# Patient Record
Sex: Female | Born: 1965 | Race: White | Hispanic: No | Smoking: Never smoker
Health system: Southern US, Community
[De-identification: ages and names within clinical notes are randomized; demographics above are authoritative.]

## PROBLEM LIST (undated history)

## (undated) DIAGNOSIS — K219 Gastro-esophageal reflux disease without esophagitis: Secondary | ICD-10-CM

## (undated) DIAGNOSIS — G43909 Migraine, unspecified, not intractable, without status migrainosus: Secondary | ICD-10-CM

## (undated) DIAGNOSIS — B029 Zoster without complications: Secondary | ICD-10-CM

## (undated) DIAGNOSIS — K3184 Gastroparesis: Secondary | ICD-10-CM

## (undated) DIAGNOSIS — T4145XA Adverse effect of unspecified anesthetic, initial encounter: Secondary | ICD-10-CM

## (undated) DIAGNOSIS — M4316 Spondylolisthesis, lumbar region: Secondary | ICD-10-CM

## (undated) DIAGNOSIS — T8859XA Other complications of anesthesia, initial encounter: Secondary | ICD-10-CM

## (undated) DIAGNOSIS — K449 Diaphragmatic hernia without obstruction or gangrene: Secondary | ICD-10-CM

## (undated) DIAGNOSIS — L0292 Furuncle, unspecified: Secondary | ICD-10-CM

## (undated) DIAGNOSIS — I82409 Acute embolism and thrombosis of unspecified deep veins of unspecified lower extremity: Secondary | ICD-10-CM

## (undated) DIAGNOSIS — K279 Peptic ulcer, site unspecified, unspecified as acute or chronic, without hemorrhage or perforation: Secondary | ICD-10-CM

## (undated) DIAGNOSIS — E782 Mixed hyperlipidemia: Secondary | ICD-10-CM

## (undated) DIAGNOSIS — K589 Irritable bowel syndrome without diarrhea: Secondary | ICD-10-CM

## (undated) DIAGNOSIS — Z22322 Carrier or suspected carrier of Methicillin resistant Staphylococcus aureus: Secondary | ICD-10-CM

## (undated) DIAGNOSIS — B373 Candidiasis of vulva and vagina: Secondary | ICD-10-CM

## (undated) DIAGNOSIS — G2581 Restless legs syndrome: Secondary | ICD-10-CM

## (undated) DIAGNOSIS — C8590 Non-Hodgkin lymphoma, unspecified, unspecified site: Secondary | ICD-10-CM

## (undated) DIAGNOSIS — F419 Anxiety disorder, unspecified: Secondary | ICD-10-CM

## (undated) DIAGNOSIS — K2980 Duodenitis without bleeding: Secondary | ICD-10-CM

## (undated) DIAGNOSIS — B20 Human immunodeficiency virus [HIV] disease: Secondary | ICD-10-CM

## (undated) DIAGNOSIS — S83232A Complex tear of medial meniscus, current injury, left knee, initial encounter: Secondary | ICD-10-CM

## (undated) DIAGNOSIS — S0300XA Dislocation of jaw, unspecified side, initial encounter: Secondary | ICD-10-CM

## (undated) DIAGNOSIS — E785 Hyperlipidemia, unspecified: Secondary | ICD-10-CM

## (undated) DIAGNOSIS — J449 Chronic obstructive pulmonary disease, unspecified: Secondary | ICD-10-CM

## (undated) DIAGNOSIS — S83281A Other tear of lateral meniscus, current injury, right knee, initial encounter: Secondary | ICD-10-CM

## (undated) DIAGNOSIS — K2900 Acute gastritis without bleeding: Secondary | ICD-10-CM

## (undated) DIAGNOSIS — D649 Anemia, unspecified: Secondary | ICD-10-CM

## (undated) DIAGNOSIS — Z21 Asymptomatic human immunodeficiency virus [HIV] infection status: Secondary | ICD-10-CM

## (undated) DIAGNOSIS — M94261 Chondromalacia, right knee: Secondary | ICD-10-CM

## (undated) DIAGNOSIS — M23342 Other meniscus derangements, anterior horn of lateral meniscus, left knee: Secondary | ICD-10-CM

## (undated) HISTORY — DX: Gastroparesis: K31.84

## (undated) HISTORY — DX: Migraine, unspecified, not intractable, without status migrainosus: G43.909

## (undated) HISTORY — DX: Duodenitis without bleeding: K29.80

## (undated) HISTORY — PX: CHOLECYSTECTOMY: SHX55

## (undated) HISTORY — DX: Non-Hodgkin lymphoma, unspecified, unspecified site: C85.90

## (undated) HISTORY — DX: Zoster without complications: B02.9

## (undated) HISTORY — DX: Diaphragmatic hernia without obstruction or gangrene: K44.9

## (undated) HISTORY — DX: Mixed hyperlipidemia: E78.2

## (undated) HISTORY — DX: Irritable bowel syndrome, unspecified: K58.9

## (undated) HISTORY — DX: Acute gastritis without bleeding: K29.00

## (undated) HISTORY — DX: Asymptomatic human immunodeficiency virus (hiv) infection status: Z21

## (undated) HISTORY — DX: Gastro-esophageal reflux disease without esophagitis: K21.9

## (undated) HISTORY — DX: Human immunodeficiency virus (HIV) disease: B20

## (undated) HISTORY — DX: Furuncle, unspecified: L02.92

## (undated) HISTORY — DX: Spondylolisthesis, lumbar region: M43.16

## (undated) HISTORY — DX: Hyperlipidemia, unspecified: E78.5

## (undated) HISTORY — DX: Restless legs syndrome: G25.81

## (undated) HISTORY — DX: Anxiety disorder, unspecified: F41.9

## (undated) HISTORY — PX: UPPER GASTROINTESTINAL ENDOSCOPY: SHX188

## (undated) HISTORY — PX: TYMPANOSTOMY TUBE PLACEMENT: SHX32

## (undated) HISTORY — DX: Anemia, unspecified: D64.9

## (undated) HISTORY — PX: TONSILLECTOMY: SUR1361

## (undated) HISTORY — PX: ENDOMETRIAL ABLATION: SHX621

## (undated) HISTORY — DX: Peptic ulcer, site unspecified, unspecified as acute or chronic, without hemorrhage or perforation: K27.9

## (undated) HISTORY — DX: Candidiasis of vulva and vagina: B37.3

## (undated) HISTORY — PX: TYMPANOPLASTY: SHX33

---
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
## 1997-07-04 DIAGNOSIS — K279 Peptic ulcer, site unspecified, unspecified as acute or chronic, without hemorrhage or perforation: Secondary | ICD-10-CM

## 1997-07-04 HISTORY — DX: Peptic ulcer, site unspecified, unspecified as acute or chronic, without hemorrhage or perforation: K27.9

## 1997-10-09 ENCOUNTER — Encounter: Admission: RE | Admit: 1997-10-09 | Discharge: 1997-10-09 | Payer: Self-pay | Admitting: Internal Medicine

## 1997-11-18 ENCOUNTER — Encounter: Admission: RE | Admit: 1997-11-18 | Discharge: 1997-11-18 | Payer: Self-pay | Admitting: Internal Medicine

## 1998-02-13 ENCOUNTER — Encounter: Admission: RE | Admit: 1998-02-13 | Discharge: 1998-02-13 | Payer: Self-pay | Admitting: Internal Medicine

## 1998-02-24 ENCOUNTER — Encounter: Admission: RE | Admit: 1998-02-24 | Discharge: 1998-02-24 | Payer: Self-pay | Admitting: Internal Medicine

## 1998-02-25 ENCOUNTER — Encounter: Admission: RE | Admit: 1998-02-25 | Discharge: 1998-02-25 | Payer: Self-pay | Admitting: Internal Medicine

## 1998-03-23 ENCOUNTER — Encounter: Admission: RE | Admit: 1998-03-23 | Discharge: 1998-03-23 | Payer: Self-pay | Admitting: Internal Medicine

## 1998-04-13 ENCOUNTER — Encounter: Admission: RE | Admit: 1998-04-13 | Discharge: 1998-04-13 | Payer: Self-pay | Admitting: Internal Medicine

## 1998-04-13 ENCOUNTER — Encounter: Payer: Self-pay | Admitting: Internal Medicine

## 1998-04-13 ENCOUNTER — Ambulatory Visit (HOSPITAL_COMMUNITY): Admission: RE | Admit: 1998-04-13 | Discharge: 1998-04-13 | Payer: Self-pay | Admitting: Internal Medicine

## 1998-04-14 ENCOUNTER — Encounter: Admission: RE | Admit: 1998-04-14 | Discharge: 1998-04-14 | Payer: Self-pay | Admitting: Hematology and Oncology

## 1998-05-08 ENCOUNTER — Ambulatory Visit (HOSPITAL_COMMUNITY): Admission: RE | Admit: 1998-05-08 | Discharge: 1998-05-08 | Payer: Self-pay | Admitting: *Deleted

## 1998-05-08 ENCOUNTER — Encounter: Admission: RE | Admit: 1998-05-08 | Discharge: 1998-05-08 | Payer: Self-pay | Admitting: Internal Medicine

## 1998-08-20 ENCOUNTER — Encounter: Admission: RE | Admit: 1998-08-20 | Discharge: 1998-08-20 | Payer: Self-pay | Admitting: Internal Medicine

## 1998-08-20 ENCOUNTER — Encounter: Payer: Self-pay | Admitting: Hematology and Oncology

## 1998-08-20 ENCOUNTER — Ambulatory Visit (HOSPITAL_COMMUNITY): Admission: RE | Admit: 1998-08-20 | Discharge: 1998-08-20 | Payer: Self-pay | Admitting: Hematology and Oncology

## 1998-09-03 ENCOUNTER — Encounter: Admission: RE | Admit: 1998-09-03 | Discharge: 1998-09-03 | Payer: Self-pay | Admitting: Hematology and Oncology

## 1998-10-07 ENCOUNTER — Encounter: Admission: RE | Admit: 1998-10-07 | Discharge: 1998-10-07 | Payer: Self-pay | Admitting: Hematology and Oncology

## 1998-11-16 ENCOUNTER — Encounter: Admission: RE | Admit: 1998-11-16 | Discharge: 1998-11-16 | Payer: Self-pay | Admitting: Internal Medicine

## 1998-11-16 ENCOUNTER — Ambulatory Visit (HOSPITAL_COMMUNITY): Admission: RE | Admit: 1998-11-16 | Discharge: 1998-11-16 | Payer: Self-pay | Admitting: *Deleted

## 1998-12-15 ENCOUNTER — Encounter: Admission: RE | Admit: 1998-12-15 | Discharge: 1998-12-15 | Payer: Self-pay | Admitting: Hematology and Oncology

## 1998-12-29 ENCOUNTER — Encounter: Admission: RE | Admit: 1998-12-29 | Discharge: 1998-12-29 | Payer: Self-pay | Admitting: Hematology and Oncology

## 1999-02-22 ENCOUNTER — Ambulatory Visit (HOSPITAL_COMMUNITY): Admission: RE | Admit: 1999-02-22 | Discharge: 1999-02-22 | Payer: Self-pay | Admitting: Internal Medicine

## 1999-02-22 ENCOUNTER — Encounter: Admission: RE | Admit: 1999-02-22 | Discharge: 1999-02-22 | Payer: Self-pay | Admitting: Internal Medicine

## 1999-03-11 ENCOUNTER — Encounter: Admission: RE | Admit: 1999-03-11 | Discharge: 1999-03-11 | Payer: Self-pay | Admitting: Internal Medicine

## 1999-04-28 ENCOUNTER — Encounter: Admission: RE | Admit: 1999-04-28 | Discharge: 1999-04-28 | Payer: Self-pay | Admitting: Hematology and Oncology

## 1999-05-25 ENCOUNTER — Ambulatory Visit (HOSPITAL_COMMUNITY): Admission: RE | Admit: 1999-05-25 | Discharge: 1999-05-25 | Payer: Self-pay | Admitting: Internal Medicine

## 1999-05-25 ENCOUNTER — Encounter: Admission: RE | Admit: 1999-05-25 | Discharge: 1999-05-25 | Payer: Self-pay | Admitting: Internal Medicine

## 1999-06-08 ENCOUNTER — Encounter: Admission: RE | Admit: 1999-06-08 | Discharge: 1999-06-08 | Payer: Self-pay | Admitting: Obstetrics & Gynecology

## 1999-06-10 ENCOUNTER — Ambulatory Visit (HOSPITAL_COMMUNITY): Admission: RE | Admit: 1999-06-10 | Discharge: 1999-06-10 | Payer: Self-pay | Admitting: *Deleted

## 1999-08-17 ENCOUNTER — Ambulatory Visit (HOSPITAL_COMMUNITY): Admission: RE | Admit: 1999-08-17 | Discharge: 1999-08-17 | Payer: Self-pay | Admitting: Internal Medicine

## 1999-08-17 ENCOUNTER — Encounter: Admission: RE | Admit: 1999-08-17 | Discharge: 1999-08-17 | Payer: Self-pay | Admitting: Internal Medicine

## 1999-09-07 ENCOUNTER — Encounter: Admission: RE | Admit: 1999-09-07 | Discharge: 1999-09-07 | Payer: Self-pay | Admitting: Obstetrics & Gynecology

## 1999-09-07 ENCOUNTER — Encounter: Admission: RE | Admit: 1999-09-07 | Discharge: 1999-09-07 | Payer: Self-pay | Admitting: Internal Medicine

## 1999-09-07 ENCOUNTER — Other Ambulatory Visit: Admission: RE | Admit: 1999-09-07 | Discharge: 1999-09-07 | Payer: Self-pay | Admitting: Obstetrics & Gynecology

## 1999-09-13 ENCOUNTER — Encounter: Payer: Self-pay | Admitting: Obstetrics & Gynecology

## 1999-09-13 ENCOUNTER — Ambulatory Visit (HOSPITAL_COMMUNITY): Admission: RE | Admit: 1999-09-13 | Discharge: 1999-09-13 | Payer: Self-pay | Admitting: Obstetrics & Gynecology

## 1999-09-21 ENCOUNTER — Encounter: Admission: RE | Admit: 1999-09-21 | Discharge: 1999-09-21 | Payer: Self-pay | Admitting: Internal Medicine

## 1999-09-21 ENCOUNTER — Encounter: Admission: RE | Admit: 1999-09-21 | Discharge: 1999-09-21 | Payer: Self-pay | Admitting: Obstetrics & Gynecology

## 1999-09-21 ENCOUNTER — Ambulatory Visit (HOSPITAL_COMMUNITY): Admission: RE | Admit: 1999-09-21 | Discharge: 1999-09-21 | Payer: Self-pay | Admitting: Hematology and Oncology

## 1999-10-05 ENCOUNTER — Encounter: Admission: RE | Admit: 1999-10-05 | Discharge: 1999-10-05 | Payer: Self-pay | Admitting: Obstetrics & Gynecology

## 1999-10-22 ENCOUNTER — Encounter: Admission: RE | Admit: 1999-10-22 | Discharge: 1999-10-22 | Payer: Self-pay | Admitting: Internal Medicine

## 1999-12-14 ENCOUNTER — Encounter: Admission: RE | Admit: 1999-12-14 | Discharge: 1999-12-14 | Payer: Self-pay | Admitting: Internal Medicine

## 1999-12-14 ENCOUNTER — Ambulatory Visit (HOSPITAL_COMMUNITY): Admission: RE | Admit: 1999-12-14 | Discharge: 1999-12-14 | Payer: Self-pay | Admitting: Internal Medicine

## 1999-12-16 ENCOUNTER — Encounter: Payer: Self-pay | Admitting: Gastroenterology

## 1999-12-16 ENCOUNTER — Ambulatory Visit (HOSPITAL_COMMUNITY): Admission: RE | Admit: 1999-12-16 | Discharge: 1999-12-16 | Payer: Self-pay | Admitting: Gastroenterology

## 2000-02-17 ENCOUNTER — Encounter: Admission: RE | Admit: 2000-02-17 | Discharge: 2000-02-17 | Payer: Self-pay | Admitting: Obstetrics

## 2000-02-17 ENCOUNTER — Ambulatory Visit (HOSPITAL_COMMUNITY): Admission: RE | Admit: 2000-02-17 | Discharge: 2000-02-17 | Payer: Self-pay | Admitting: Obstetrics

## 2000-03-13 ENCOUNTER — Encounter: Admission: RE | Admit: 2000-03-13 | Discharge: 2000-03-13 | Payer: Self-pay | Admitting: Internal Medicine

## 2000-05-03 ENCOUNTER — Ambulatory Visit (HOSPITAL_COMMUNITY): Admission: RE | Admit: 2000-05-03 | Discharge: 2000-05-03 | Payer: Self-pay | Admitting: Gastroenterology

## 2000-05-03 ENCOUNTER — Encounter: Payer: Self-pay | Admitting: Gastroenterology

## 2000-05-11 ENCOUNTER — Encounter: Admission: RE | Admit: 2000-05-11 | Discharge: 2000-05-11 | Payer: Self-pay | Admitting: Hematology and Oncology

## 2000-05-18 ENCOUNTER — Ambulatory Visit (HOSPITAL_COMMUNITY): Admission: RE | Admit: 2000-05-18 | Discharge: 2000-05-18 | Payer: Self-pay | Admitting: Internal Medicine

## 2000-05-18 ENCOUNTER — Encounter: Admission: RE | Admit: 2000-05-18 | Discharge: 2000-05-18 | Payer: Self-pay | Admitting: Obstetrics

## 2000-05-18 ENCOUNTER — Encounter: Admission: RE | Admit: 2000-05-18 | Discharge: 2000-05-18 | Payer: Self-pay | Admitting: Internal Medicine

## 2000-07-17 ENCOUNTER — Encounter: Admission: RE | Admit: 2000-07-17 | Discharge: 2000-07-17 | Payer: Self-pay | Admitting: Internal Medicine

## 2000-08-31 ENCOUNTER — Encounter: Admission: RE | Admit: 2000-08-31 | Discharge: 2000-08-31 | Payer: Self-pay | Admitting: Internal Medicine

## 2000-08-31 ENCOUNTER — Ambulatory Visit (HOSPITAL_COMMUNITY): Admission: RE | Admit: 2000-08-31 | Discharge: 2000-08-31 | Payer: Self-pay | Admitting: Hematology and Oncology

## 2000-09-14 ENCOUNTER — Encounter: Admission: RE | Admit: 2000-09-14 | Discharge: 2000-09-14 | Payer: Self-pay | Admitting: Hematology and Oncology

## 2000-11-09 ENCOUNTER — Ambulatory Visit (HOSPITAL_COMMUNITY): Admission: RE | Admit: 2000-11-09 | Discharge: 2000-11-09 | Payer: Self-pay | Admitting: Hematology and Oncology

## 2000-11-27 ENCOUNTER — Encounter: Admission: RE | Admit: 2000-11-27 | Discharge: 2000-11-27 | Payer: Self-pay | Admitting: Internal Medicine

## 2001-02-01 ENCOUNTER — Ambulatory Visit (HOSPITAL_COMMUNITY): Admission: RE | Admit: 2001-02-01 | Discharge: 2001-02-01 | Payer: Self-pay | Admitting: Internal Medicine

## 2001-02-01 ENCOUNTER — Encounter: Admission: RE | Admit: 2001-02-01 | Discharge: 2001-02-01 | Payer: Self-pay | Admitting: Internal Medicine

## 2001-02-21 ENCOUNTER — Encounter: Admission: RE | Admit: 2001-02-21 | Discharge: 2001-02-21 | Payer: Self-pay | Admitting: Internal Medicine

## 2001-03-27 ENCOUNTER — Encounter: Admission: RE | Admit: 2001-03-27 | Discharge: 2001-03-27 | Payer: Self-pay | Admitting: Obstetrics & Gynecology

## 2001-04-03 ENCOUNTER — Encounter: Admission: RE | Admit: 2001-04-03 | Discharge: 2001-04-03 | Payer: Self-pay | Admitting: Obstetrics & Gynecology

## 2001-05-28 ENCOUNTER — Ambulatory Visit (HOSPITAL_COMMUNITY): Admission: RE | Admit: 2001-05-28 | Discharge: 2001-05-28 | Payer: Self-pay | Admitting: Internal Medicine

## 2001-05-28 ENCOUNTER — Encounter: Admission: RE | Admit: 2001-05-28 | Discharge: 2001-05-28 | Payer: Self-pay | Admitting: Internal Medicine

## 2001-06-06 ENCOUNTER — Encounter: Admission: RE | Admit: 2001-06-06 | Discharge: 2001-06-06 | Payer: Self-pay | Admitting: Internal Medicine

## 2001-06-20 ENCOUNTER — Encounter: Admission: RE | Admit: 2001-06-20 | Discharge: 2001-06-20 | Payer: Self-pay | Admitting: Internal Medicine

## 2001-07-10 ENCOUNTER — Encounter: Admission: RE | Admit: 2001-07-10 | Discharge: 2001-07-10 | Payer: Self-pay | Admitting: Obstetrics & Gynecology

## 2001-09-10 ENCOUNTER — Encounter: Admission: RE | Admit: 2001-09-10 | Discharge: 2001-09-10 | Payer: Self-pay

## 2001-09-10 ENCOUNTER — Ambulatory Visit (HOSPITAL_COMMUNITY): Admission: RE | Admit: 2001-09-10 | Discharge: 2001-09-10 | Payer: Self-pay

## 2001-09-13 ENCOUNTER — Ambulatory Visit (HOSPITAL_COMMUNITY): Admission: RE | Admit: 2001-09-13 | Discharge: 2001-09-13 | Payer: Self-pay | Admitting: *Deleted

## 2001-10-23 ENCOUNTER — Ambulatory Visit (HOSPITAL_COMMUNITY): Admission: RE | Admit: 2001-10-23 | Discharge: 2001-10-23 | Payer: Self-pay | Admitting: Gastroenterology

## 2001-10-23 ENCOUNTER — Encounter: Payer: Self-pay | Admitting: Gastroenterology

## 2001-11-01 ENCOUNTER — Encounter: Admission: RE | Admit: 2001-11-01 | Discharge: 2002-01-30 | Payer: Self-pay | Admitting: *Deleted

## 2001-11-16 ENCOUNTER — Ambulatory Visit (HOSPITAL_COMMUNITY): Admission: RE | Admit: 2001-11-16 | Discharge: 2001-11-16 | Payer: Self-pay | Admitting: Gastroenterology

## 2001-11-16 ENCOUNTER — Encounter: Payer: Self-pay | Admitting: Gastroenterology

## 2001-12-06 ENCOUNTER — Ambulatory Visit (HOSPITAL_COMMUNITY): Admission: RE | Admit: 2001-12-06 | Discharge: 2001-12-06 | Payer: Self-pay | Admitting: Internal Medicine

## 2001-12-06 ENCOUNTER — Encounter: Admission: RE | Admit: 2001-12-06 | Discharge: 2001-12-06 | Payer: Self-pay | Admitting: *Deleted

## 2001-12-06 ENCOUNTER — Encounter: Admission: RE | Admit: 2001-12-06 | Discharge: 2001-12-06 | Payer: Self-pay | Admitting: Internal Medicine

## 2002-01-01 ENCOUNTER — Encounter (INDEPENDENT_AMBULATORY_CARE_PROVIDER_SITE_OTHER): Payer: Self-pay | Admitting: Gastroenterology

## 2002-01-01 DIAGNOSIS — K449 Diaphragmatic hernia without obstruction or gangrene: Secondary | ICD-10-CM

## 2002-01-01 DIAGNOSIS — K2900 Acute gastritis without bleeding: Secondary | ICD-10-CM

## 2002-01-01 DIAGNOSIS — K2980 Duodenitis without bleeding: Secondary | ICD-10-CM

## 2002-01-01 HISTORY — DX: Acute gastritis without bleeding: K29.00

## 2002-01-01 HISTORY — DX: Duodenitis without bleeding: K29.80

## 2002-01-01 HISTORY — DX: Diaphragmatic hernia without obstruction or gangrene: K44.9

## 2002-01-09 ENCOUNTER — Encounter: Admission: RE | Admit: 2002-01-09 | Discharge: 2002-01-09 | Payer: Self-pay | Admitting: Internal Medicine

## 2002-03-28 ENCOUNTER — Encounter: Admission: RE | Admit: 2002-03-28 | Discharge: 2002-06-26 | Payer: Self-pay | Admitting: *Deleted

## 2002-04-12 ENCOUNTER — Ambulatory Visit (HOSPITAL_COMMUNITY): Admission: RE | Admit: 2002-04-12 | Discharge: 2002-04-12 | Payer: Self-pay | Admitting: Internal Medicine

## 2002-04-12 ENCOUNTER — Encounter: Admission: RE | Admit: 2002-04-12 | Discharge: 2002-04-12 | Payer: Self-pay | Admitting: Internal Medicine

## 2002-04-23 ENCOUNTER — Encounter: Admission: RE | Admit: 2002-04-23 | Discharge: 2002-04-23 | Payer: Self-pay | Admitting: Internal Medicine

## 2002-08-06 ENCOUNTER — Ambulatory Visit (HOSPITAL_COMMUNITY): Admission: RE | Admit: 2002-08-06 | Discharge: 2002-08-06 | Payer: Self-pay | Admitting: Internal Medicine

## 2002-08-06 ENCOUNTER — Encounter: Admission: RE | Admit: 2002-08-06 | Discharge: 2002-08-06 | Payer: Self-pay | Admitting: Internal Medicine

## 2002-08-13 ENCOUNTER — Encounter: Payer: Self-pay | Admitting: Internal Medicine

## 2002-08-13 ENCOUNTER — Ambulatory Visit (HOSPITAL_COMMUNITY): Admission: RE | Admit: 2002-08-13 | Discharge: 2002-08-13 | Payer: Self-pay | Admitting: Internal Medicine

## 2002-08-16 ENCOUNTER — Encounter: Admission: RE | Admit: 2002-08-16 | Discharge: 2002-08-16 | Payer: Self-pay | Admitting: Internal Medicine

## 2002-08-21 ENCOUNTER — Encounter: Admission: RE | Admit: 2002-08-21 | Discharge: 2002-08-21 | Payer: Self-pay | Admitting: Internal Medicine

## 2002-09-05 ENCOUNTER — Encounter: Admission: RE | Admit: 2002-09-05 | Discharge: 2002-09-05 | Payer: Self-pay | Admitting: Internal Medicine

## 2002-11-04 ENCOUNTER — Encounter: Admission: RE | Admit: 2002-11-04 | Discharge: 2002-11-04 | Payer: Self-pay | Admitting: Internal Medicine

## 2002-11-29 ENCOUNTER — Encounter: Admission: RE | Admit: 2002-11-29 | Discharge: 2002-11-29 | Payer: Self-pay | Admitting: Internal Medicine

## 2003-01-10 ENCOUNTER — Encounter: Admission: RE | Admit: 2003-01-10 | Discharge: 2003-01-10 | Payer: Self-pay | Admitting: Internal Medicine

## 2003-02-14 ENCOUNTER — Encounter: Admission: RE | Admit: 2003-02-14 | Discharge: 2003-02-14 | Payer: Self-pay | Admitting: Internal Medicine

## 2003-02-19 ENCOUNTER — Encounter: Admission: RE | Admit: 2003-02-19 | Discharge: 2003-02-19 | Payer: Self-pay | Admitting: Internal Medicine

## 2003-02-19 ENCOUNTER — Ambulatory Visit (HOSPITAL_COMMUNITY): Admission: RE | Admit: 2003-02-19 | Discharge: 2003-02-19 | Payer: Self-pay | Admitting: Internal Medicine

## 2003-02-19 ENCOUNTER — Encounter (INDEPENDENT_AMBULATORY_CARE_PROVIDER_SITE_OTHER): Payer: Self-pay | Admitting: Internal Medicine

## 2003-03-13 ENCOUNTER — Encounter: Admission: RE | Admit: 2003-03-13 | Discharge: 2003-03-13 | Payer: Self-pay | Admitting: Internal Medicine

## 2003-03-18 ENCOUNTER — Ambulatory Visit (HOSPITAL_COMMUNITY): Admission: RE | Admit: 2003-03-18 | Discharge: 2003-03-18 | Payer: Self-pay | Admitting: Internal Medicine

## 2003-03-18 ENCOUNTER — Encounter: Payer: Self-pay | Admitting: Internal Medicine

## 2003-03-24 ENCOUNTER — Encounter: Payer: Self-pay | Admitting: Gastroenterology

## 2003-03-24 ENCOUNTER — Ambulatory Visit (HOSPITAL_COMMUNITY): Admission: RE | Admit: 2003-03-24 | Discharge: 2003-03-24 | Payer: Self-pay | Admitting: Gastroenterology

## 2003-04-16 ENCOUNTER — Encounter: Admission: RE | Admit: 2003-04-16 | Discharge: 2003-04-16 | Payer: Self-pay | Admitting: Internal Medicine

## 2003-06-20 ENCOUNTER — Encounter: Admission: RE | Admit: 2003-06-20 | Discharge: 2003-06-20 | Payer: Self-pay | Admitting: Internal Medicine

## 2003-06-20 ENCOUNTER — Ambulatory Visit (HOSPITAL_COMMUNITY): Admission: RE | Admit: 2003-06-20 | Discharge: 2003-06-20 | Payer: Self-pay | Admitting: Internal Medicine

## 2003-06-20 ENCOUNTER — Encounter (INDEPENDENT_AMBULATORY_CARE_PROVIDER_SITE_OTHER): Payer: Self-pay | Admitting: Internal Medicine

## 2003-06-26 ENCOUNTER — Encounter (INDEPENDENT_AMBULATORY_CARE_PROVIDER_SITE_OTHER): Payer: Self-pay | Admitting: *Deleted

## 2003-06-26 ENCOUNTER — Encounter (INDEPENDENT_AMBULATORY_CARE_PROVIDER_SITE_OTHER): Payer: Self-pay | Admitting: Radiology

## 2003-06-26 ENCOUNTER — Other Ambulatory Visit: Admission: RE | Admit: 2003-06-26 | Discharge: 2003-06-26 | Payer: Self-pay | Admitting: Radiology

## 2003-06-26 ENCOUNTER — Encounter: Admission: RE | Admit: 2003-06-26 | Discharge: 2003-06-26 | Payer: Self-pay | Admitting: Obstetrics & Gynecology

## 2003-07-02 ENCOUNTER — Encounter: Admission: RE | Admit: 2003-07-02 | Discharge: 2003-07-02 | Payer: Self-pay | Admitting: Obstetrics & Gynecology

## 2003-07-18 ENCOUNTER — Encounter (HOSPITAL_BASED_OUTPATIENT_CLINIC_OR_DEPARTMENT_OTHER): Payer: Self-pay | Admitting: General Surgery

## 2003-07-18 ENCOUNTER — Ambulatory Visit (HOSPITAL_COMMUNITY): Admission: RE | Admit: 2003-07-18 | Discharge: 2003-07-19 | Payer: Self-pay | Admitting: General Surgery

## 2003-07-18 ENCOUNTER — Encounter (INDEPENDENT_AMBULATORY_CARE_PROVIDER_SITE_OTHER): Payer: Self-pay | Admitting: *Deleted

## 2003-07-31 ENCOUNTER — Ambulatory Visit (HOSPITAL_COMMUNITY): Admission: RE | Admit: 2003-07-31 | Discharge: 2003-07-31 | Payer: Self-pay | Admitting: Oncology

## 2003-07-31 ENCOUNTER — Encounter (INDEPENDENT_AMBULATORY_CARE_PROVIDER_SITE_OTHER): Payer: Self-pay | Admitting: Specialist

## 2003-08-01 ENCOUNTER — Ambulatory Visit (HOSPITAL_COMMUNITY): Admission: RE | Admit: 2003-08-01 | Discharge: 2003-08-01 | Payer: Self-pay | Admitting: Oncology

## 2003-08-01 ENCOUNTER — Encounter (INDEPENDENT_AMBULATORY_CARE_PROVIDER_SITE_OTHER): Payer: Self-pay | Admitting: Specialist

## 2003-08-04 ENCOUNTER — Ambulatory Visit (HOSPITAL_COMMUNITY): Admission: RE | Admit: 2003-08-04 | Discharge: 2003-08-04 | Payer: Self-pay | Admitting: General Surgery

## 2003-08-04 ENCOUNTER — Ambulatory Visit (HOSPITAL_COMMUNITY): Admission: RE | Admit: 2003-08-04 | Discharge: 2003-08-04 | Payer: Self-pay | Admitting: Oncology

## 2003-08-04 ENCOUNTER — Ambulatory Visit (HOSPITAL_BASED_OUTPATIENT_CLINIC_OR_DEPARTMENT_OTHER): Admission: RE | Admit: 2003-08-04 | Discharge: 2003-08-04 | Payer: Self-pay | Admitting: General Surgery

## 2003-08-11 ENCOUNTER — Encounter: Admission: RE | Admit: 2003-08-11 | Discharge: 2003-08-11 | Payer: Self-pay | Admitting: Internal Medicine

## 2003-08-13 ENCOUNTER — Inpatient Hospital Stay (HOSPITAL_COMMUNITY): Admission: AD | Admit: 2003-08-13 | Discharge: 2003-08-18 | Payer: Self-pay | Admitting: Oncology

## 2003-09-11 ENCOUNTER — Ambulatory Visit (HOSPITAL_COMMUNITY): Admission: RE | Admit: 2003-09-11 | Discharge: 2003-09-11 | Payer: Self-pay | Admitting: Internal Medicine

## 2003-09-11 ENCOUNTER — Encounter: Admission: RE | Admit: 2003-09-11 | Discharge: 2003-09-11 | Payer: Self-pay | Admitting: Internal Medicine

## 2003-09-16 ENCOUNTER — Ambulatory Visit: Admission: RE | Admit: 2003-09-16 | Discharge: 2003-12-05 | Payer: Self-pay | Admitting: Radiation Oncology

## 2003-10-15 ENCOUNTER — Inpatient Hospital Stay (HOSPITAL_COMMUNITY): Admission: EM | Admit: 2003-10-15 | Discharge: 2003-10-18 | Payer: Self-pay | Admitting: Oncology

## 2003-11-03 ENCOUNTER — Encounter: Admission: RE | Admit: 2003-11-03 | Discharge: 2003-11-03 | Payer: Self-pay | Admitting: Internal Medicine

## 2003-11-28 ENCOUNTER — Ambulatory Visit (HOSPITAL_BASED_OUTPATIENT_CLINIC_OR_DEPARTMENT_OTHER): Admission: RE | Admit: 2003-11-28 | Discharge: 2003-11-28 | Payer: Self-pay | Admitting: General Surgery

## 2003-12-11 ENCOUNTER — Ambulatory Visit (HOSPITAL_COMMUNITY): Admission: RE | Admit: 2003-12-11 | Discharge: 2003-12-11 | Payer: Self-pay | Admitting: Internal Medicine

## 2003-12-11 ENCOUNTER — Encounter: Admission: RE | Admit: 2003-12-11 | Discharge: 2003-12-11 | Payer: Self-pay | Admitting: Internal Medicine

## 2003-12-23 ENCOUNTER — Ambulatory Visit: Admission: RE | Admit: 2003-12-23 | Discharge: 2003-12-23 | Payer: Self-pay | Admitting: Radiation Oncology

## 2004-01-20 ENCOUNTER — Ambulatory Visit: Admission: RE | Admit: 2004-01-20 | Discharge: 2004-01-20 | Payer: Self-pay | Admitting: Radiation Oncology

## 2004-01-20 ENCOUNTER — Ambulatory Visit (HOSPITAL_COMMUNITY): Admission: RE | Admit: 2004-01-20 | Discharge: 2004-01-20 | Payer: Self-pay | Admitting: Radiation Oncology

## 2004-02-11 ENCOUNTER — Ambulatory Visit (HOSPITAL_COMMUNITY): Admission: RE | Admit: 2004-02-11 | Discharge: 2004-02-11 | Payer: Self-pay | Admitting: Internal Medicine

## 2004-02-11 ENCOUNTER — Encounter: Admission: RE | Admit: 2004-02-11 | Discharge: 2004-02-11 | Payer: Self-pay | Admitting: Internal Medicine

## 2004-02-12 ENCOUNTER — Ambulatory Visit (HOSPITAL_COMMUNITY): Admission: RE | Admit: 2004-02-12 | Discharge: 2004-02-12 | Payer: Self-pay | Admitting: Oncology

## 2004-02-18 ENCOUNTER — Encounter: Admission: RE | Admit: 2004-02-18 | Discharge: 2004-02-18 | Payer: Self-pay | Admitting: Internal Medicine

## 2004-02-23 ENCOUNTER — Ambulatory Visit (HOSPITAL_COMMUNITY): Admission: RE | Admit: 2004-02-23 | Discharge: 2004-02-23 | Payer: Self-pay | Admitting: Oncology

## 2004-03-02 ENCOUNTER — Encounter: Admission: RE | Admit: 2004-03-02 | Discharge: 2004-03-02 | Payer: Self-pay | Admitting: Internal Medicine

## 2004-04-26 ENCOUNTER — Encounter: Admission: RE | Admit: 2004-04-26 | Discharge: 2004-05-19 | Payer: Self-pay | Admitting: General Surgery

## 2004-04-27 ENCOUNTER — Ambulatory Visit: Admission: RE | Admit: 2004-04-27 | Discharge: 2004-04-27 | Payer: Self-pay | Admitting: Radiation Oncology

## 2004-05-04 ENCOUNTER — Ambulatory Visit: Admission: RE | Admit: 2004-05-04 | Discharge: 2004-05-04 | Payer: Self-pay | Admitting: Radiation Oncology

## 2004-05-12 ENCOUNTER — Ambulatory Visit: Payer: Self-pay | Admitting: Internal Medicine

## 2004-05-12 ENCOUNTER — Ambulatory Visit (HOSPITAL_COMMUNITY): Admission: RE | Admit: 2004-05-12 | Discharge: 2004-05-12 | Payer: Self-pay | Admitting: Internal Medicine

## 2004-06-25 ENCOUNTER — Ambulatory Visit (HOSPITAL_COMMUNITY): Admission: RE | Admit: 2004-06-25 | Discharge: 2004-06-25 | Payer: Self-pay | Admitting: Oncology

## 2004-06-25 ENCOUNTER — Ambulatory Visit: Payer: Self-pay | Admitting: Oncology

## 2004-06-29 ENCOUNTER — Encounter: Admission: RE | Admit: 2004-06-29 | Discharge: 2004-06-29 | Payer: Self-pay | Admitting: Internal Medicine

## 2004-09-15 ENCOUNTER — Ambulatory Visit (HOSPITAL_COMMUNITY): Admission: RE | Admit: 2004-09-15 | Discharge: 2004-09-15 | Payer: Self-pay | Admitting: Internal Medicine

## 2004-09-15 ENCOUNTER — Ambulatory Visit: Payer: Self-pay | Admitting: Internal Medicine

## 2004-09-29 ENCOUNTER — Ambulatory Visit: Payer: Self-pay | Admitting: Gastroenterology

## 2004-09-30 ENCOUNTER — Encounter: Admission: RE | Admit: 2004-09-30 | Discharge: 2004-09-30 | Payer: Self-pay | Admitting: *Deleted

## 2004-10-20 ENCOUNTER — Ambulatory Visit: Payer: Self-pay | Admitting: Oncology

## 2004-10-21 ENCOUNTER — Ambulatory Visit (HOSPITAL_COMMUNITY): Admission: RE | Admit: 2004-10-21 | Discharge: 2004-10-21 | Payer: Self-pay | Admitting: Oncology

## 2005-01-19 ENCOUNTER — Ambulatory Visit (HOSPITAL_COMMUNITY): Admission: RE | Admit: 2005-01-19 | Discharge: 2005-01-19 | Payer: Self-pay | Admitting: Internal Medicine

## 2005-01-19 ENCOUNTER — Ambulatory Visit: Payer: Self-pay | Admitting: Internal Medicine

## 2005-01-20 ENCOUNTER — Ambulatory Visit: Payer: Self-pay

## 2005-02-11 ENCOUNTER — Ambulatory Visit: Payer: Self-pay | Admitting: Oncology

## 2005-02-14 ENCOUNTER — Ambulatory Visit (HOSPITAL_COMMUNITY): Admission: RE | Admit: 2005-02-14 | Discharge: 2005-02-14 | Payer: Self-pay | Admitting: Oncology

## 2005-02-23 ENCOUNTER — Ambulatory Visit: Payer: Self-pay | Admitting: Internal Medicine

## 2005-04-15 ENCOUNTER — Ambulatory Visit: Payer: Self-pay | Admitting: Internal Medicine

## 2005-06-08 ENCOUNTER — Ambulatory Visit (HOSPITAL_COMMUNITY): Admission: RE | Admit: 2005-06-08 | Discharge: 2005-06-08 | Payer: Self-pay | Admitting: Infectious Diseases

## 2005-06-08 ENCOUNTER — Ambulatory Visit: Payer: Self-pay | Admitting: Internal Medicine

## 2005-06-10 ENCOUNTER — Ambulatory Visit: Payer: Self-pay | Admitting: Oncology

## 2005-06-13 ENCOUNTER — Ambulatory Visit (HOSPITAL_COMMUNITY): Admission: RE | Admit: 2005-06-13 | Discharge: 2005-06-13 | Payer: Self-pay | Admitting: Oncology

## 2005-06-30 ENCOUNTER — Encounter: Admission: RE | Admit: 2005-06-30 | Discharge: 2005-06-30 | Payer: Self-pay | Admitting: Internal Medicine

## 2005-06-30 ENCOUNTER — Ambulatory Visit (HOSPITAL_COMMUNITY): Admission: RE | Admit: 2005-06-30 | Discharge: 2005-06-30 | Payer: Self-pay | Admitting: Obstetrics & Gynecology

## 2005-07-29 ENCOUNTER — Ambulatory Visit: Payer: Self-pay | Admitting: Internal Medicine

## 2005-09-28 ENCOUNTER — Ambulatory Visit: Payer: Self-pay | Admitting: Internal Medicine

## 2005-09-28 ENCOUNTER — Encounter: Admission: RE | Admit: 2005-09-28 | Discharge: 2005-09-28 | Payer: Self-pay | Admitting: Internal Medicine

## 2005-11-02 ENCOUNTER — Ambulatory Visit: Payer: Self-pay | Admitting: Internal Medicine

## 2005-11-14 ENCOUNTER — Ambulatory Visit: Payer: Self-pay | Admitting: Gastroenterology

## 2005-12-02 ENCOUNTER — Encounter (INDEPENDENT_AMBULATORY_CARE_PROVIDER_SITE_OTHER): Payer: Self-pay | Admitting: *Deleted

## 2005-12-02 ENCOUNTER — Ambulatory Visit: Payer: Self-pay | Admitting: Gastroenterology

## 2005-12-05 ENCOUNTER — Ambulatory Visit: Payer: Self-pay | Admitting: Gastroenterology

## 2005-12-08 ENCOUNTER — Ambulatory Visit: Payer: Self-pay | Admitting: Oncology

## 2005-12-12 ENCOUNTER — Ambulatory Visit (HOSPITAL_COMMUNITY): Admission: RE | Admit: 2005-12-12 | Discharge: 2005-12-12 | Payer: Self-pay | Admitting: Oncology

## 2005-12-12 LAB — COMPREHENSIVE METABOLIC PANEL
AST: 12 U/L (ref 0–37)
Albumin: 4.4 g/dL (ref 3.5–5.2)
Alkaline Phosphatase: 66 U/L (ref 39–117)
BUN: 11 mg/dL (ref 6–23)
Glucose, Bld: 127 mg/dL — ABNORMAL HIGH (ref 70–99)
Potassium: 3.9 mEq/L (ref 3.5–5.3)
Sodium: 138 mEq/L (ref 135–145)
Total Bilirubin: 0.3 mg/dL (ref 0.3–1.2)

## 2005-12-12 LAB — CBC WITH DIFFERENTIAL/PLATELET
BASO%: 0.4 % (ref 0.0–2.0)
EOS%: 1.5 % (ref 0.0–7.0)
HCT: 39.8 % (ref 34.8–46.6)
LYMPH%: 28.4 % (ref 14.0–48.0)
MCH: 33.7 pg (ref 26.0–34.0)
MCHC: 34 g/dL (ref 32.0–36.0)
NEUT%: 64.9 % (ref 39.6–76.8)
Platelets: 289 10*3/uL (ref 145–400)
RBC: 4.02 10*6/uL (ref 3.70–5.32)

## 2005-12-28 ENCOUNTER — Ambulatory Visit (HOSPITAL_COMMUNITY): Admission: RE | Admit: 2005-12-28 | Discharge: 2005-12-28 | Payer: Self-pay | Admitting: Gastroenterology

## 2006-01-09 ENCOUNTER — Ambulatory Visit: Payer: Self-pay | Admitting: Gastroenterology

## 2006-01-19 ENCOUNTER — Ambulatory Visit (HOSPITAL_COMMUNITY): Admission: RE | Admit: 2006-01-19 | Discharge: 2006-01-19 | Payer: Self-pay | Admitting: Internal Medicine

## 2006-01-19 ENCOUNTER — Ambulatory Visit: Payer: Self-pay | Admitting: Internal Medicine

## 2006-05-02 ENCOUNTER — Encounter: Admission: RE | Admit: 2006-05-02 | Discharge: 2006-05-02 | Payer: Self-pay | Admitting: Internal Medicine

## 2006-05-02 ENCOUNTER — Encounter (INDEPENDENT_AMBULATORY_CARE_PROVIDER_SITE_OTHER): Payer: Self-pay | Admitting: Infectious Diseases

## 2006-05-02 ENCOUNTER — Ambulatory Visit: Payer: Self-pay | Admitting: Internal Medicine

## 2006-05-02 LAB — CONVERTED CEMR LAB
ALT: 12 units/L (ref 0–40)
Albumin: 4 g/dL (ref 3.5–5.2)
CO2: 24 meq/L (ref 19–32)
Calcium: 8.4 mg/dL (ref 8.4–10.5)
Chloride: 104 meq/L (ref 96–112)
Cholesterol: 180 mg/dL (ref 0–200)
Glucose, Bld: 209 mg/dL — ABNORMAL HIGH (ref 70–99)
HIV-1 RNA Quant, Log: <1.7 (ref <1.70)
Leukocyte count, blood: 4.9 10*9/L (ref 4.0–10.5)
MCV: 95.8 fL (ref 78.0–100.0)
Potassium: 4.1 meq/L (ref 3.5–5.3)
RBC: 4.28 M/uL (ref 3.87–5.11)
Sodium: 138 meq/L (ref 135–145)
Total Protein: 6.3 g/dL (ref 6.0–8.3)
Triglycerides: 403 mg/dL — ABNORMAL HIGH (ref <150)

## 2006-05-16 ENCOUNTER — Ambulatory Visit: Payer: Self-pay | Admitting: Hospitalist

## 2006-05-16 ENCOUNTER — Encounter (INDEPENDENT_AMBULATORY_CARE_PROVIDER_SITE_OTHER): Payer: Self-pay | Admitting: Internal Medicine

## 2006-05-16 LAB — CONVERTED CEMR LAB
CO2: 27 meq/L (ref 19–32)
Glucose, Bld: 146 mg/dL — ABNORMAL HIGH (ref 70–99)
Potassium: 3.9 meq/L (ref 3.5–5.3)
Sodium: 139 meq/L (ref 135–145)

## 2006-05-17 ENCOUNTER — Ambulatory Visit: Payer: Self-pay | Admitting: Gastroenterology

## 2006-06-09 ENCOUNTER — Ambulatory Visit: Payer: Self-pay | Admitting: Oncology

## 2006-06-13 ENCOUNTER — Ambulatory Visit (HOSPITAL_COMMUNITY): Admission: RE | Admit: 2006-06-13 | Discharge: 2006-06-13 | Payer: Self-pay | Admitting: Oncology

## 2006-06-13 LAB — COMPREHENSIVE METABOLIC PANEL
ALT: 16 U/L (ref 0–35)
Albumin: 4.2 g/dL (ref 3.5–5.2)
CO2: 25 mEq/L (ref 19–32)
Potassium: 4.2 mEq/L (ref 3.5–5.3)
Sodium: 140 mEq/L (ref 135–145)
Total Bilirubin: 0.4 mg/dL (ref 0.3–1.2)
Total Protein: 6.9 g/dL (ref 6.0–8.3)

## 2006-06-13 LAB — CBC WITH DIFFERENTIAL/PLATELET
Basophils Absolute: 0.1 10*3/uL (ref 0.0–0.1)
EOS%: 1.5 % (ref 0.0–7.0)
HCT: 39 % (ref 34.8–46.6)
HGB: 13.3 g/dL (ref 11.6–15.9)
LYMPH%: 34.4 % (ref 14.0–48.0)
MCH: 30.6 pg (ref 26.0–34.0)
MCV: 90 fL (ref 81.0–101.0)
MONO%: 5 % (ref 0.0–13.0)
NEUT%: 58.3 % (ref 39.6–76.8)
Platelets: 219 10*3/uL (ref 145–400)

## 2006-06-13 LAB — LACTATE DEHYDROGENASE: LDH: 125 U/L (ref 94–250)

## 2006-06-13 LAB — MORPHOLOGY

## 2006-07-03 ENCOUNTER — Encounter: Admission: RE | Admit: 2006-07-03 | Discharge: 2006-07-03 | Payer: Self-pay | Admitting: Pulmonary Disease

## 2006-07-14 DIAGNOSIS — G43719 Chronic migraine without aura, intractable, without status migrainosus: Secondary | ICD-10-CM

## 2006-07-14 DIAGNOSIS — K209 Esophagitis, unspecified without bleeding: Secondary | ICD-10-CM | POA: Insufficient documentation

## 2006-07-14 DIAGNOSIS — G43711 Chronic migraine without aura, intractable, with status migrainosus: Secondary | ICD-10-CM | POA: Insufficient documentation

## 2006-07-14 DIAGNOSIS — B20 Human immunodeficiency virus [HIV] disease: Secondary | ICD-10-CM | POA: Insufficient documentation

## 2006-07-19 DIAGNOSIS — F32A Depression, unspecified: Secondary | ICD-10-CM | POA: Insufficient documentation

## 2006-07-19 DIAGNOSIS — E785 Hyperlipidemia, unspecified: Secondary | ICD-10-CM

## 2006-07-19 DIAGNOSIS — G609 Hereditary and idiopathic neuropathy, unspecified: Secondary | ICD-10-CM | POA: Insufficient documentation

## 2006-07-19 DIAGNOSIS — F411 Generalized anxiety disorder: Secondary | ICD-10-CM | POA: Insufficient documentation

## 2006-07-19 DIAGNOSIS — Z87898 Personal history of other specified conditions: Secondary | ICD-10-CM | POA: Insufficient documentation

## 2006-07-19 DIAGNOSIS — F329 Major depressive disorder, single episode, unspecified: Secondary | ICD-10-CM

## 2006-07-19 DIAGNOSIS — K219 Gastro-esophageal reflux disease without esophagitis: Secondary | ICD-10-CM | POA: Insufficient documentation

## 2006-08-31 ENCOUNTER — Encounter: Admission: RE | Admit: 2006-08-31 | Discharge: 2006-08-31 | Payer: Self-pay | Admitting: Internal Medicine

## 2006-08-31 ENCOUNTER — Encounter (INDEPENDENT_AMBULATORY_CARE_PROVIDER_SITE_OTHER): Payer: Self-pay | Admitting: Pulmonary Disease

## 2006-08-31 ENCOUNTER — Ambulatory Visit: Payer: Self-pay | Admitting: Internal Medicine

## 2006-08-31 DIAGNOSIS — I82409 Acute embolism and thrombosis of unspecified deep veins of unspecified lower extremity: Secondary | ICD-10-CM | POA: Insufficient documentation

## 2006-08-31 LAB — CONVERTED CEMR LAB
Basophils Relative: 0 % (ref 0–1)
Cholesterol: 164 mg/dL (ref 0–200)
Creatinine, Urine: 250.2 mg/dL
Eosinophils Absolute: 0.1 10*3/uL (ref 0.0–0.7)
HDL: 34 mg/dL — ABNORMAL LOW (ref >39)
Hgb A1c MFr Bld: 5.5 %
LDL Cholesterol: 58 mg/dL (ref 0–99)
Lymphs Abs: 2 10*3/uL (ref 0.7–3.3)
MCHC: 31.2 g/dL (ref 30.0–36.0)
MCV: 93.7 fL (ref 78.0–100.0)
Microalb, Ur: 0.75 mg/dL (ref 0.00–1.89)
Monocytes Relative: 6 % (ref 3–11)
Neutro Abs: 5.1 10*3/uL (ref 1.7–7.7)
Neutrophils Relative %: 67 % (ref 43–77)
Platelets: 246 10*3/uL (ref 150–400)
RBC: 4.73 M/uL (ref 3.87–5.11)
Triglycerides: 358 mg/dL — ABNORMAL HIGH (ref <150)
WBC: 7.6 10*3/uL (ref 4.0–10.5)

## 2006-09-01 ENCOUNTER — Ambulatory Visit (HOSPITAL_COMMUNITY): Admission: RE | Admit: 2006-09-01 | Discharge: 2006-09-01 | Payer: Self-pay | Admitting: Hospitalist

## 2006-09-01 ENCOUNTER — Ambulatory Visit: Payer: Self-pay | Admitting: Internal Medicine

## 2006-09-01 ENCOUNTER — Ambulatory Visit: Payer: Self-pay | Admitting: Vascular Surgery

## 2006-09-01 ENCOUNTER — Ambulatory Visit (HOSPITAL_COMMUNITY): Admission: RE | Admit: 2006-09-01 | Discharge: 2006-09-01 | Payer: Self-pay | Admitting: Internal Medicine

## 2006-09-01 ENCOUNTER — Encounter (INDEPENDENT_AMBULATORY_CARE_PROVIDER_SITE_OTHER): Payer: Self-pay | Admitting: Pulmonary Disease

## 2006-09-01 LAB — CONVERTED CEMR LAB
BUN: 8 mg/dL (ref 6–23)
CO2: 26 meq/L (ref 19–32)
Calcium: 9 mg/dL (ref 8.4–10.5)
Chloride: 100 meq/L (ref 96–112)
Creatinine, Ser: 0.69 mg/dL (ref 0.40–1.20)

## 2006-09-04 ENCOUNTER — Ambulatory Visit: Payer: Self-pay | Admitting: *Deleted

## 2006-09-04 LAB — CONVERTED CEMR LAB: INR: 1.3

## 2006-09-06 ENCOUNTER — Ambulatory Visit: Payer: Self-pay | Admitting: Gastroenterology

## 2006-09-07 ENCOUNTER — Ambulatory Visit: Payer: Self-pay | Admitting: Internal Medicine

## 2006-09-11 ENCOUNTER — Encounter (INDEPENDENT_AMBULATORY_CARE_PROVIDER_SITE_OTHER): Payer: Self-pay | Admitting: Pharmacist

## 2006-09-11 ENCOUNTER — Ambulatory Visit: Payer: Self-pay | Admitting: Internal Medicine

## 2006-09-11 LAB — CONVERTED CEMR LAB: INR: 2

## 2006-09-14 ENCOUNTER — Ambulatory Visit: Payer: Self-pay | Admitting: *Deleted

## 2006-09-14 LAB — CONVERTED CEMR LAB: INR: 2.6

## 2006-09-18 ENCOUNTER — Ambulatory Visit: Payer: Self-pay | Admitting: *Deleted

## 2006-09-18 ENCOUNTER — Encounter (INDEPENDENT_AMBULATORY_CARE_PROVIDER_SITE_OTHER): Payer: Self-pay | Admitting: Pharmacist

## 2006-09-18 LAB — CONVERTED CEMR LAB: INR: 1.3

## 2006-09-26 ENCOUNTER — Telehealth: Payer: Self-pay | Admitting: *Deleted

## 2006-09-26 ENCOUNTER — Ambulatory Visit (HOSPITAL_COMMUNITY): Admission: RE | Admit: 2006-09-26 | Discharge: 2006-09-26 | Payer: Self-pay | Admitting: Obstetrics & Gynecology

## 2006-10-02 ENCOUNTER — Ambulatory Visit: Payer: Self-pay | Admitting: *Deleted

## 2006-10-09 ENCOUNTER — Ambulatory Visit: Payer: Self-pay | Admitting: *Deleted

## 2006-10-11 ENCOUNTER — Ambulatory Visit: Payer: Self-pay | Admitting: Gastroenterology

## 2006-10-16 ENCOUNTER — Ambulatory Visit: Payer: Self-pay | Admitting: Hospitalist

## 2006-10-16 LAB — CONVERTED CEMR LAB: INR: 1.5

## 2006-10-19 ENCOUNTER — Ambulatory Visit: Payer: Self-pay | Admitting: Internal Medicine

## 2006-10-19 ENCOUNTER — Encounter (INDEPENDENT_AMBULATORY_CARE_PROVIDER_SITE_OTHER): Payer: Self-pay | Admitting: Pulmonary Disease

## 2006-10-19 LAB — CONVERTED CEMR LAB
CO2: 25 meq/L (ref 19–32)
Calcium: 9.2 mg/dL (ref 8.4–10.5)
Chloride: 101 meq/L (ref 96–112)
Glucose, Bld: 105 mg/dL — ABNORMAL HIGH (ref 70–99)
Hemoglobin, Urine: NEGATIVE
Ketones, ur: NEGATIVE mg/dL
Nitrite: NEGATIVE
Potassium: 4 meq/L (ref 3.5–5.3)
Sodium: 140 meq/L (ref 135–145)
Specific Gravity, Urine: 1.017 (ref 1.005–1.03)
Urine Glucose: NEGATIVE mg/dL
pH: 6.5 (ref 5.0–8.0)

## 2006-10-23 ENCOUNTER — Ambulatory Visit: Payer: Self-pay | Admitting: Internal Medicine

## 2006-10-23 ENCOUNTER — Encounter (INDEPENDENT_AMBULATORY_CARE_PROVIDER_SITE_OTHER): Payer: Self-pay | Admitting: Pharmacist

## 2006-10-23 LAB — CONVERTED CEMR LAB
INR: 2.1
INR: 2.1

## 2006-10-25 ENCOUNTER — Encounter (INDEPENDENT_AMBULATORY_CARE_PROVIDER_SITE_OTHER): Payer: Self-pay | Admitting: Pulmonary Disease

## 2006-10-26 ENCOUNTER — Telehealth (INDEPENDENT_AMBULATORY_CARE_PROVIDER_SITE_OTHER): Payer: Self-pay | Admitting: Pharmacist

## 2006-10-26 ENCOUNTER — Encounter: Payer: Self-pay | Admitting: Pharmacist

## 2006-10-26 LAB — CONVERTED CEMR LAB

## 2006-10-30 ENCOUNTER — Ambulatory Visit: Payer: Self-pay | Admitting: Hospitalist

## 2006-10-30 LAB — CONVERTED CEMR LAB: INR: 1.1

## 2006-11-01 ENCOUNTER — Ambulatory Visit (HOSPITAL_COMMUNITY): Admission: RE | Admit: 2006-11-01 | Discharge: 2006-11-01 | Payer: Self-pay | Admitting: Pulmonary Disease

## 2006-11-01 ENCOUNTER — Encounter: Payer: Self-pay | Admitting: Internal Medicine

## 2006-11-07 ENCOUNTER — Encounter (INDEPENDENT_AMBULATORY_CARE_PROVIDER_SITE_OTHER): Payer: Self-pay | Admitting: Pulmonary Disease

## 2006-11-07 ENCOUNTER — Ambulatory Visit: Payer: Self-pay | Admitting: Internal Medicine

## 2006-11-07 LAB — CONVERTED CEMR LAB
HCT: 39.4 % (ref 36.0–46.0)
Hemoglobin: 13.4 g/dL (ref 12.0–15.0)
INR: 1.4
MCV: 90.9 fL (ref 78.0–100.0)
RDW: 17 % — ABNORMAL HIGH (ref 11.5–14.0)
WBC: 7.6 10*3/uL (ref 4.0–10.5)

## 2006-11-10 ENCOUNTER — Telehealth: Payer: Self-pay | Admitting: *Deleted

## 2006-11-23 ENCOUNTER — Encounter: Payer: Self-pay | Admitting: Pharmacist

## 2006-11-23 ENCOUNTER — Ambulatory Visit: Payer: Self-pay | Admitting: Internal Medicine

## 2006-12-04 ENCOUNTER — Ambulatory Visit: Payer: Self-pay | Admitting: Internal Medicine

## 2006-12-04 LAB — CONVERTED CEMR LAB: INR: 1.3

## 2006-12-06 ENCOUNTER — Ambulatory Visit: Payer: Self-pay | Admitting: Gastroenterology

## 2006-12-07 ENCOUNTER — Telehealth (INDEPENDENT_AMBULATORY_CARE_PROVIDER_SITE_OTHER): Payer: Self-pay | Admitting: *Deleted

## 2006-12-15 ENCOUNTER — Ambulatory Visit: Payer: Self-pay | Admitting: Internal Medicine

## 2006-12-15 ENCOUNTER — Encounter (INDEPENDENT_AMBULATORY_CARE_PROVIDER_SITE_OTHER): Payer: Self-pay | Admitting: Pulmonary Disease

## 2006-12-15 LAB — CONVERTED CEMR LAB
AST: 15 units/L (ref 0–37)
Albumin: 4.5 g/dL (ref 3.5–5.2)
Alkaline Phosphatase: 62 units/L (ref 39–117)
Chloride: 97 meq/L (ref 96–112)
Potassium: 3.5 meq/L (ref 3.5–5.3)
Sodium: 137 meq/L (ref 135–145)
Total Protein: 7.9 g/dL (ref 6.0–8.3)

## 2006-12-25 ENCOUNTER — Ambulatory Visit: Payer: Self-pay | Admitting: Internal Medicine

## 2007-01-15 ENCOUNTER — Ambulatory Visit: Payer: Self-pay | Admitting: *Deleted

## 2007-02-06 ENCOUNTER — Encounter: Payer: Self-pay | Admitting: Internal Medicine

## 2007-02-06 ENCOUNTER — Encounter: Admission: RE | Admit: 2007-02-06 | Discharge: 2007-02-06 | Payer: Self-pay | Admitting: *Deleted

## 2007-02-06 ENCOUNTER — Encounter (INDEPENDENT_AMBULATORY_CARE_PROVIDER_SITE_OTHER): Payer: Self-pay | Admitting: *Deleted

## 2007-02-06 ENCOUNTER — Telehealth: Payer: Self-pay | Admitting: *Deleted

## 2007-02-06 ENCOUNTER — Ambulatory Visit: Payer: Self-pay | Admitting: *Deleted

## 2007-02-06 LAB — CONVERTED CEMR LAB
HIV 1 RNA Quant: <50 copies/mL (ref <50)
Hgb A1c MFr Bld: 5.5 %
INR: 2.1

## 2007-02-07 LAB — CONVERTED CEMR LAB
ALT: 15 units/L (ref 0–35)
Basophils Absolute: 0 10*3/uL (ref 0.0–0.1)
CO2: 26 meq/L (ref 19–32)
Calcium: 9.9 mg/dL (ref 8.4–10.5)
Chloride: 101 meq/L (ref 96–112)
Creatinine, Ser: 0.89 mg/dL (ref 0.40–1.20)
Hemoglobin: 14.4 g/dL (ref 12.0–15.0)
Lipase: <10 units/L (ref 0–75)
Lymphocytes Relative: 32 % (ref 12–46)
Lymphs Abs: 2.3 10*3/uL (ref 0.7–3.3)
Monocytes Absolute: 0.4 10*3/uL (ref 0.2–0.7)
Monocytes Relative: 5 % (ref 3–11)
Neutro Abs: 4.4 10*3/uL (ref 1.7–7.7)
RBC: 4.62 M/uL (ref 3.87–5.11)
WBC: 7.1 10*3/uL (ref 4.0–10.5)

## 2007-02-09 ENCOUNTER — Ambulatory Visit: Payer: Self-pay | Admitting: Gastroenterology

## 2007-02-09 ENCOUNTER — Encounter: Payer: Self-pay | Admitting: Internal Medicine

## 2007-02-12 ENCOUNTER — Ambulatory Visit: Payer: Self-pay | Admitting: *Deleted

## 2007-03-02 ENCOUNTER — Telehealth: Payer: Self-pay | Admitting: *Deleted

## 2007-04-18 ENCOUNTER — Encounter: Payer: Self-pay | Admitting: Internal Medicine

## 2007-04-25 ENCOUNTER — Encounter: Payer: Self-pay | Admitting: Internal Medicine

## 2007-04-25 ENCOUNTER — Ambulatory Visit: Payer: Self-pay | Admitting: Hospitalist

## 2007-04-25 LAB — CONVERTED CEMR LAB
Albumin: 4.9 g/dL (ref 3.5–5.2)
BUN: 13 mg/dL (ref 6–23)
CO2: 26 meq/L (ref 19–32)
Calcium: 9.9 mg/dL (ref 8.4–10.5)
Chloride: 101 meq/L (ref 96–112)
Creatinine, Urine: 321.1 mg/dL
Glucose, Bld: 119 mg/dL — ABNORMAL HIGH (ref 70–99)
HDL: 42 mg/dL (ref >39)
Lymphocytes Relative: 26 % (ref 12–46)
Lymphs Abs: 1.9 10*3/uL (ref 0.7–3.3)
MCV: 94 fL (ref 78.0–100.0)
Microalb, Ur: 1.56 mg/dL (ref 0.00–1.89)
Monocytes Relative: 5 % (ref 3–11)
Neutro Abs: 4.8 10*3/uL (ref 1.7–7.7)
Neutrophils Relative %: 68 % (ref 43–77)
Nitrite: NEGATIVE
Potassium: 3.8 meq/L (ref 3.5–5.3)
RBC: 4.63 M/uL (ref 3.87–5.11)
Specific Gravity, Urine: 1.03 (ref 1.005–1.03)
Triglycerides: 331 mg/dL — ABNORMAL HIGH (ref <150)
WBC: 7.1 10*3/uL (ref 4.0–10.5)
pH: 8 (ref 5.0–8.0)

## 2007-04-26 ENCOUNTER — Encounter: Payer: Self-pay | Admitting: Internal Medicine

## 2007-04-30 ENCOUNTER — Ambulatory Visit (HOSPITAL_COMMUNITY): Admission: RE | Admit: 2007-04-30 | Discharge: 2007-04-30 | Payer: Self-pay | Admitting: Hospitalist

## 2007-04-30 ENCOUNTER — Ambulatory Visit: Payer: Self-pay | Admitting: Surgery

## 2007-04-30 ENCOUNTER — Encounter (INDEPENDENT_AMBULATORY_CARE_PROVIDER_SITE_OTHER): Payer: Self-pay | Admitting: Hospitalist

## 2007-05-27 ENCOUNTER — Encounter: Payer: Self-pay | Admitting: Internal Medicine

## 2007-06-08 ENCOUNTER — Ambulatory Visit: Payer: Self-pay | Admitting: Oncology

## 2007-06-12 ENCOUNTER — Encounter: Admission: RE | Admit: 2007-06-12 | Discharge: 2007-06-12 | Payer: Self-pay | Admitting: Internal Medicine

## 2007-06-12 ENCOUNTER — Encounter: Payer: Self-pay | Admitting: Internal Medicine

## 2007-06-12 ENCOUNTER — Ambulatory Visit: Payer: Self-pay | Admitting: Internal Medicine

## 2007-06-12 ENCOUNTER — Ambulatory Visit (HOSPITAL_COMMUNITY): Admission: RE | Admit: 2007-06-12 | Discharge: 2007-06-12 | Payer: Self-pay | Admitting: Oncology

## 2007-06-12 LAB — CONVERTED CEMR LAB
Basophils Absolute: 0 10*3/uL (ref 0.0–0.1)
Basophils Relative: 0 % (ref 0–1)
Eosinophils Absolute: 0.2 10*3/uL (ref 0.2–0.7)
MCHC: 32.1 g/dL (ref 30.0–36.0)
MCV: 90.3 fL (ref 78.0–100.0)
Neutro Abs: 3.8 10*3/uL (ref 1.7–7.7)
Neutrophils Relative %: 60 % (ref 43–77)
Platelets: 194 10*3/uL (ref 150–400)
RBC: 4.65 M/uL (ref 3.87–5.11)
RDW: 14.8 % (ref 11.5–15.5)

## 2007-06-12 LAB — CBC WITH DIFFERENTIAL/PLATELET
Basophils Absolute: 0.1 10*3/uL (ref 0.0–0.1)
EOS%: 3 % (ref 0.0–7.0)
Eosinophils Absolute: 0.2 10*3/uL (ref 0.0–0.5)
HGB: 13.4 g/dL (ref 11.6–15.9)
LYMPH%: 31.9 % (ref 14.0–48.0)
MCH: 29.8 pg (ref 26.0–34.0)
MCV: 87 fL (ref 81.0–101.0)
MONO%: 5.3 % (ref 0.0–13.0)
NEUT#: 3.4 10*3/uL (ref 1.5–6.5)
NEUT%: 58.8 % (ref 39.6–76.8)
Platelets: 220 10*3/uL (ref 145–400)
RDW: 15.8 % — ABNORMAL HIGH (ref 11.3–14.5)

## 2007-06-12 LAB — COMPREHENSIVE METABOLIC PANEL
Albumin: 4.1 g/dL (ref 3.5–5.2)
Alkaline Phosphatase: 80 U/L (ref 39–117)
BUN: 17 mg/dL (ref 6–23)
CO2: 27 mEq/L (ref 19–32)
Calcium: 8.9 mg/dL (ref 8.4–10.5)
Chloride: 104 mEq/L (ref 96–112)
Glucose, Bld: 110 mg/dL — ABNORMAL HIGH (ref 70–99)
Potassium: 4.1 mEq/L (ref 3.5–5.3)
Sodium: 141 mEq/L (ref 135–145)
Total Protein: 7 g/dL (ref 6.0–8.3)

## 2007-07-06 ENCOUNTER — Encounter: Admission: RE | Admit: 2007-07-06 | Discharge: 2007-07-06 | Payer: Self-pay | Admitting: Internal Medicine

## 2007-07-11 ENCOUNTER — Encounter: Payer: Self-pay | Admitting: Internal Medicine

## 2007-08-15 ENCOUNTER — Encounter: Payer: Self-pay | Admitting: Internal Medicine

## 2007-08-15 ENCOUNTER — Ambulatory Visit: Payer: Self-pay | Admitting: Internal Medicine

## 2007-08-15 ENCOUNTER — Ambulatory Visit (HOSPITAL_COMMUNITY): Admission: RE | Admit: 2007-08-15 | Discharge: 2007-08-15 | Payer: Self-pay | Admitting: Internal Medicine

## 2007-08-15 DIAGNOSIS — M25569 Pain in unspecified knee: Secondary | ICD-10-CM

## 2007-08-15 DIAGNOSIS — IMO0001 Reserved for inherently not codable concepts without codable children: Secondary | ICD-10-CM

## 2007-08-15 LAB — CONVERTED CEMR LAB
ALT: 14 units/L (ref 0–35)
Alkaline Phosphatase: 53 units/L (ref 39–117)
Blood Glucose, Fingerstick: 134
CO2: 24 meq/L (ref 19–32)
Creatinine, Ser: 0.76 mg/dL (ref 0.40–1.20)
Hgb A1c MFr Bld: 5.9 %
Sodium: 141 meq/L (ref 135–145)
Total Bilirubin: 0.4 mg/dL (ref 0.3–1.2)
Total CK: 64 units/L (ref 7–177)
Total Protein: 7.4 g/dL (ref 6.0–8.3)

## 2007-08-17 ENCOUNTER — Encounter: Payer: Self-pay | Admitting: Internal Medicine

## 2007-08-27 ENCOUNTER — Encounter: Payer: Self-pay | Admitting: Internal Medicine

## 2007-08-27 ENCOUNTER — Encounter: Admission: RE | Admit: 2007-08-27 | Discharge: 2007-11-14 | Payer: Self-pay | Admitting: Internal Medicine

## 2007-09-04 ENCOUNTER — Telehealth: Payer: Self-pay | Admitting: *Deleted

## 2007-09-12 ENCOUNTER — Encounter: Payer: Self-pay | Admitting: Internal Medicine

## 2007-09-14 ENCOUNTER — Ambulatory Visit: Payer: Self-pay | Admitting: *Deleted

## 2007-09-14 LAB — CONVERTED CEMR LAB: Blood Glucose, Fingerstick: 127

## 2007-09-17 DIAGNOSIS — R32 Unspecified urinary incontinence: Secondary | ICD-10-CM

## 2007-09-18 ENCOUNTER — Encounter: Payer: Self-pay | Admitting: Internal Medicine

## 2007-09-29 ENCOUNTER — Encounter: Payer: Self-pay | Admitting: Internal Medicine

## 2007-10-02 ENCOUNTER — Ambulatory Visit (HOSPITAL_COMMUNITY): Admission: RE | Admit: 2007-10-02 | Discharge: 2007-10-02 | Payer: Self-pay | Admitting: Obstetrics & Gynecology

## 2007-10-03 ENCOUNTER — Ambulatory Visit: Payer: Self-pay | Admitting: Vascular Surgery

## 2007-10-03 ENCOUNTER — Encounter: Payer: Self-pay | Admitting: Internal Medicine

## 2007-10-07 ENCOUNTER — Encounter: Payer: Self-pay | Admitting: Internal Medicine

## 2007-10-09 ENCOUNTER — Telehealth: Payer: Self-pay | Admitting: Infectious Disease

## 2007-10-24 ENCOUNTER — Encounter: Payer: Self-pay | Admitting: Internal Medicine

## 2007-11-13 ENCOUNTER — Encounter: Payer: Self-pay | Admitting: Internal Medicine

## 2007-11-14 ENCOUNTER — Encounter: Payer: Self-pay | Admitting: Internal Medicine

## 2007-12-03 ENCOUNTER — Encounter: Admission: RE | Admit: 2007-12-03 | Discharge: 2007-12-03 | Payer: Self-pay | Admitting: *Deleted

## 2007-12-03 ENCOUNTER — Ambulatory Visit: Payer: Self-pay | Admitting: *Deleted

## 2007-12-03 ENCOUNTER — Encounter (INDEPENDENT_AMBULATORY_CARE_PROVIDER_SITE_OTHER): Payer: Self-pay | Admitting: *Deleted

## 2007-12-03 LAB — CONVERTED CEMR LAB
Blood Glucose, Fingerstick: 111
HIV-1 RNA Quant, Log: <1.7 (ref <1.70)
Hgb A1c MFr Bld: 5.7 %

## 2008-01-21 ENCOUNTER — Encounter: Payer: Self-pay | Admitting: Internal Medicine

## 2008-01-29 ENCOUNTER — Ambulatory Visit: Payer: Self-pay | Admitting: Internal Medicine

## 2008-01-29 ENCOUNTER — Encounter: Payer: Self-pay | Admitting: Internal Medicine

## 2008-01-29 LAB — CONVERTED CEMR LAB
ALT: 11 units/L (ref 0–35)
AST: 12 units/L (ref 0–37)
Alkaline Phosphatase: 57 units/L (ref 39–117)
BUN: 16 mg/dL (ref 6–23)
Basophils Absolute: 0 10*3/uL (ref 0.0–0.1)
Basophils Relative: 0 % (ref 0–1)
Blood Glucose, Fingerstick: 98
Creatinine, Ser: 0.77 mg/dL (ref 0.40–1.20)
Eosinophils Absolute: 0.1 10*3/uL (ref 0.0–0.7)
HDL: 37 mg/dL — ABNORMAL LOW (ref >39)
Hemoglobin: 14 g/dL (ref 12.0–15.0)
MCHC: 32.2 g/dL (ref 30.0–36.0)
MCV: 88.6 fL (ref 78.0–100.0)
Monocytes Absolute: 0.4 10*3/uL (ref 0.1–1.0)
Monocytes Relative: 5 % (ref 3–12)
Neutrophils Relative %: 61 % (ref 43–77)
RBC: 4.91 M/uL (ref 3.87–5.11)
RDW: 15.7 % — ABNORMAL HIGH (ref 11.5–15.5)
Total Bilirubin: 0.4 mg/dL (ref 0.3–1.2)
Total CHOL/HDL Ratio: 6.9

## 2008-01-30 ENCOUNTER — Telehealth: Payer: Self-pay | Admitting: Internal Medicine

## 2008-02-27 ENCOUNTER — Encounter: Payer: Self-pay | Admitting: Internal Medicine

## 2008-03-24 ENCOUNTER — Encounter: Payer: Self-pay | Admitting: Internal Medicine

## 2008-03-24 ENCOUNTER — Ambulatory Visit: Payer: Self-pay | Admitting: Internal Medicine

## 2008-03-24 DIAGNOSIS — J329 Chronic sinusitis, unspecified: Secondary | ICD-10-CM | POA: Insufficient documentation

## 2008-03-24 LAB — CONVERTED CEMR LAB
ALT: 11 units/L (ref 0–35)
Albumin: 4.6 g/dL (ref 3.5–5.2)
Basophils Absolute: 0 10*3/uL (ref 0.0–0.1)
Blood Glucose, Fingerstick: 93
CO2: 26 meq/L (ref 19–32)
Eosinophils Relative: 2 % (ref 0–5)
HCT: 43.9 % (ref 36.0–46.0)
HIV 1 RNA Quant: 68 copies/mL — ABNORMAL HIGH (ref <50)
Lymphocytes Relative: 36 % (ref 12–46)
Neutro Abs: 3.1 10*3/uL (ref 1.7–7.7)
Neutrophils Relative %: 56 % (ref 43–77)
Platelets: 190 10*3/uL (ref 150–400)
Potassium: 4.2 meq/L (ref 3.5–5.3)
RDW: 15.4 % (ref 11.5–15.5)
Sodium: 140 meq/L (ref 135–145)
Total Bilirubin: 0.4 mg/dL (ref 0.3–1.2)
Total Protein: 7.5 g/dL (ref 6.0–8.3)
WBC: 5.6 10*3/uL (ref 4.0–10.5)

## 2008-03-27 ENCOUNTER — Ambulatory Visit (HOSPITAL_COMMUNITY): Admission: RE | Admit: 2008-03-27 | Discharge: 2008-03-27 | Payer: Self-pay | Admitting: Obstetrics & Gynecology

## 2008-03-27 ENCOUNTER — Encounter: Payer: Self-pay | Admitting: Obstetrics & Gynecology

## 2008-04-08 ENCOUNTER — Ambulatory Visit (HOSPITAL_COMMUNITY): Admission: RE | Admit: 2008-04-08 | Discharge: 2008-04-08 | Payer: Self-pay | Admitting: Internal Medicine

## 2008-04-08 ENCOUNTER — Encounter: Payer: Self-pay | Admitting: Internal Medicine

## 2008-04-09 ENCOUNTER — Encounter: Payer: Self-pay | Admitting: Internal Medicine

## 2008-04-29 ENCOUNTER — Encounter: Payer: Self-pay | Admitting: Internal Medicine

## 2008-04-29 ENCOUNTER — Ambulatory Visit: Payer: Self-pay | Admitting: Internal Medicine

## 2008-04-29 DIAGNOSIS — R112 Nausea with vomiting, unspecified: Secondary | ICD-10-CM | POA: Insufficient documentation

## 2008-04-29 LAB — CONVERTED CEMR LAB
Alkaline Phosphatase: 52 units/L (ref 39–117)
BUN: 11 mg/dL (ref 6–23)
Eosinophils Absolute: 0.1 10*3/uL (ref 0.0–0.7)
Eosinophils Relative: 2 % (ref 0–5)
Glucose, Bld: 101 mg/dL — ABNORMAL HIGH (ref 70–99)
HCT: 43.2 % (ref 36.0–46.0)
Lymphs Abs: 2.4 10*3/uL (ref 0.7–4.0)
MCV: 89.3 fL (ref 78.0–100.0)
Monocytes Relative: 5 % (ref 3–12)
Platelets: 213 10*3/uL (ref 150–400)
RBC: 4.84 M/uL (ref 3.87–5.11)
Total Bilirubin: 0.3 mg/dL (ref 0.3–1.2)
WBC: 5.4 10*3/uL (ref 4.0–10.5)

## 2008-05-01 ENCOUNTER — Encounter: Payer: Self-pay | Admitting: Internal Medicine

## 2008-05-12 ENCOUNTER — Ambulatory Visit: Payer: Self-pay | Admitting: Sports Medicine

## 2008-05-12 ENCOUNTER — Encounter: Admission: RE | Admit: 2008-05-12 | Discharge: 2008-05-12 | Payer: Self-pay | Admitting: Sports Medicine

## 2008-05-12 DIAGNOSIS — M4310 Spondylolisthesis, site unspecified: Secondary | ICD-10-CM | POA: Insufficient documentation

## 2008-05-12 DIAGNOSIS — M629 Disorder of muscle, unspecified: Secondary | ICD-10-CM | POA: Insufficient documentation

## 2008-06-02 ENCOUNTER — Telehealth (INDEPENDENT_AMBULATORY_CARE_PROVIDER_SITE_OTHER): Payer: Self-pay | Admitting: Pharmacy Technician

## 2008-06-06 ENCOUNTER — Ambulatory Visit: Payer: Self-pay | Admitting: Oncology

## 2008-06-09 ENCOUNTER — Ambulatory Visit: Payer: Self-pay | Admitting: Sports Medicine

## 2008-06-12 ENCOUNTER — Encounter: Payer: Self-pay | Admitting: Internal Medicine

## 2008-06-12 ENCOUNTER — Ambulatory Visit: Payer: Self-pay | Admitting: Internal Medicine

## 2008-06-13 ENCOUNTER — Ambulatory Visit (HOSPITAL_COMMUNITY): Admission: RE | Admit: 2008-06-13 | Discharge: 2008-06-13 | Payer: Self-pay | Admitting: Oncology

## 2008-06-30 ENCOUNTER — Telehealth: Payer: Self-pay | Admitting: Internal Medicine

## 2008-07-09 ENCOUNTER — Encounter: Admission: RE | Admit: 2008-07-09 | Discharge: 2008-07-09 | Payer: Self-pay | Admitting: Internal Medicine

## 2008-07-21 ENCOUNTER — Ambulatory Visit: Payer: Self-pay | Admitting: Internal Medicine

## 2008-07-21 ENCOUNTER — Ambulatory Visit (HOSPITAL_COMMUNITY): Admission: RE | Admit: 2008-07-21 | Discharge: 2008-07-21 | Payer: Self-pay | Admitting: Internal Medicine

## 2008-07-23 ENCOUNTER — Ambulatory Visit: Payer: Self-pay | Admitting: Sports Medicine

## 2008-07-23 ENCOUNTER — Encounter: Payer: Self-pay | Admitting: Internal Medicine

## 2008-07-23 ENCOUNTER — Ambulatory Visit: Payer: Self-pay | Admitting: Internal Medicine

## 2008-07-23 LAB — CONVERTED CEMR LAB
Albumin: 4 g/dL (ref 3.5–5.2)
Alkaline Phosphatase: 64 units/L (ref 39–117)
BUN: 9 mg/dL (ref 6–23)
Cholesterol: 204 mg/dL — ABNORMAL HIGH (ref 0–200)
Eosinophils Absolute: 0.1 10*3/uL (ref 0.0–0.7)
Eosinophils Relative: 2 % (ref 0–5)
Glucose, Bld: 90 mg/dL (ref 70–99)
HCT: 41.7 % (ref 36.0–46.0)
HDL: 36 mg/dL — ABNORMAL LOW (ref >39)
LDL Cholesterol: 134 mg/dL — ABNORMAL HIGH (ref 0–99)
Lymphs Abs: 2 10*3/uL (ref 0.7–4.0)
MCV: 91.6 fL (ref 78.0–100.0)
Monocytes Relative: 5 % (ref 3–12)
Neutrophils Relative %: 59 % (ref 43–77)
Potassium: 4 meq/L (ref 3.5–5.3)
RBC: 4.55 M/uL (ref 3.87–5.11)
Triglycerides: 168 mg/dL — ABNORMAL HIGH (ref <150)
WBC: 6 10*3/uL (ref 4.0–10.5)

## 2008-08-06 ENCOUNTER — Telehealth: Payer: Self-pay | Admitting: Internal Medicine

## 2008-08-06 ENCOUNTER — Telehealth (INDEPENDENT_AMBULATORY_CARE_PROVIDER_SITE_OTHER): Payer: Self-pay | Admitting: *Deleted

## 2008-08-13 ENCOUNTER — Encounter: Admission: RE | Admit: 2008-08-13 | Discharge: 2008-09-25 | Payer: Self-pay | Admitting: Family Medicine

## 2008-08-21 ENCOUNTER — Ambulatory Visit: Payer: Self-pay | Admitting: *Deleted

## 2008-09-30 ENCOUNTER — Telehealth: Payer: Self-pay | Admitting: Internal Medicine

## 2008-09-30 ENCOUNTER — Encounter: Payer: Self-pay | Admitting: Gastroenterology

## 2008-10-09 ENCOUNTER — Encounter: Payer: Self-pay | Admitting: Internal Medicine

## 2008-11-03 ENCOUNTER — Encounter: Payer: Self-pay | Admitting: Internal Medicine

## 2008-11-18 ENCOUNTER — Ambulatory Visit: Payer: Self-pay | Admitting: Internal Medicine

## 2008-11-18 ENCOUNTER — Encounter: Payer: Self-pay | Admitting: Internal Medicine

## 2008-11-18 LAB — CONVERTED CEMR LAB
Benzodiazepines.: NEGATIVE
Beta hcg, urine, semiquantitative: NEGATIVE
Bilirubin Urine: NEGATIVE
Blood Glucose, Fingerstick: 109
Creatinine,U: 167.3 mg/dL
Leukocytes, UA: NEGATIVE
Methadone: NEGATIVE
Nitrite: NEGATIVE
Phencyclidine (PCP): NEGATIVE
Propoxyphene: NEGATIVE
Specific Gravity, Urine: 1.021 (ref 1.005–1.030)
Urobilinogen, UA: 0.2 (ref 0.0–1.0)

## 2008-12-05 ENCOUNTER — Telehealth: Payer: Self-pay | Admitting: Internal Medicine

## 2008-12-19 ENCOUNTER — Telehealth: Payer: Self-pay | Admitting: Internal Medicine

## 2009-01-06 ENCOUNTER — Ambulatory Visit: Payer: Self-pay | Admitting: Internal Medicine

## 2009-01-06 ENCOUNTER — Encounter: Payer: Self-pay | Admitting: Licensed Clinical Social Worker

## 2009-01-06 ENCOUNTER — Encounter: Payer: Self-pay | Admitting: Internal Medicine

## 2009-01-07 ENCOUNTER — Encounter: Payer: Self-pay | Admitting: Internal Medicine

## 2009-01-14 ENCOUNTER — Encounter: Payer: Self-pay | Admitting: Internal Medicine

## 2009-01-14 ENCOUNTER — Encounter: Payer: Self-pay | Admitting: Gastroenterology

## 2009-02-05 ENCOUNTER — Encounter: Payer: Self-pay | Admitting: Internal Medicine

## 2009-02-09 ENCOUNTER — Ambulatory Visit (HOSPITAL_COMMUNITY): Admission: RE | Admit: 2009-02-09 | Discharge: 2009-02-09 | Payer: Self-pay | Admitting: Sports Medicine

## 2009-02-09 ENCOUNTER — Ambulatory Visit: Payer: Self-pay | Admitting: Sports Medicine

## 2009-02-10 ENCOUNTER — Encounter (INDEPENDENT_AMBULATORY_CARE_PROVIDER_SITE_OTHER): Payer: Self-pay | Admitting: *Deleted

## 2009-02-11 ENCOUNTER — Telehealth: Payer: Self-pay | Admitting: Internal Medicine

## 2009-04-13 ENCOUNTER — Ambulatory Visit: Payer: Self-pay | Admitting: Internal Medicine

## 2009-04-13 LAB — CONVERTED CEMR LAB: HIV-1 RNA Quant, Log: 4.71 — ABNORMAL HIGH (ref <1.68)

## 2009-04-16 LAB — CONVERTED CEMR LAB
ALT: 20 units/L (ref 0–35)
Albumin: 4.7 g/dL (ref 3.5–5.2)
Alkaline Phosphatase: 52 units/L (ref 39–117)
Basophils Absolute: 0 10*3/uL (ref 0.0–0.1)
CO2: 22 meq/L (ref 19–32)
Glucose, Bld: 82 mg/dL (ref 70–99)
Hemoglobin: 14.4 g/dL (ref 12.0–15.0)
Lymphocytes Relative: 33 % (ref 12–46)
Monocytes Absolute: 0.3 10*3/uL (ref 0.1–1.0)
Monocytes Relative: 6 % (ref 3–12)
Neutro Abs: 3.1 10*3/uL (ref 1.7–7.7)
Neutrophils Relative %: 60 % (ref 43–77)
Potassium: 4.1 meq/L (ref 3.5–5.3)
RBC: 4.87 M/uL (ref 3.87–5.11)
Sodium: 141 meq/L (ref 135–145)
Total Bilirubin: 0.3 mg/dL (ref 0.3–1.2)
Total Protein: 7.7 g/dL (ref 6.0–8.3)
WBC: 5.2 10*3/uL (ref 4.0–10.5)

## 2009-04-20 ENCOUNTER — Telehealth (INDEPENDENT_AMBULATORY_CARE_PROVIDER_SITE_OTHER): Payer: Self-pay | Admitting: Internal Medicine

## 2009-04-20 ENCOUNTER — Encounter (INDEPENDENT_AMBULATORY_CARE_PROVIDER_SITE_OTHER): Payer: Self-pay | Admitting: Internal Medicine

## 2009-05-13 ENCOUNTER — Ambulatory Visit: Payer: Self-pay | Admitting: Infectious Disease

## 2009-05-13 LAB — CONVERTED CEMR LAB
HIV-1 RNA Quant, Log: 4.95 — ABNORMAL HIGH (ref <1.68)
Hgb A1c MFr Bld: 5.2 %

## 2009-06-04 ENCOUNTER — Ambulatory Visit: Payer: Self-pay | Admitting: Infectious Disease

## 2009-06-05 ENCOUNTER — Ambulatory Visit: Payer: Self-pay | Admitting: Oncology

## 2009-06-05 ENCOUNTER — Telehealth (INDEPENDENT_AMBULATORY_CARE_PROVIDER_SITE_OTHER): Payer: Self-pay | Admitting: *Deleted

## 2009-06-09 ENCOUNTER — Ambulatory Visit (HOSPITAL_COMMUNITY): Admission: RE | Admit: 2009-06-09 | Discharge: 2009-06-09 | Payer: Self-pay | Admitting: Oncology

## 2009-06-09 ENCOUNTER — Telehealth: Payer: Self-pay | Admitting: Internal Medicine

## 2009-06-09 ENCOUNTER — Encounter: Payer: Self-pay | Admitting: Internal Medicine

## 2009-06-09 ENCOUNTER — Encounter: Payer: Self-pay | Admitting: Infectious Disease

## 2009-06-09 LAB — MORPHOLOGY: RBC Comments: NORMAL

## 2009-06-09 LAB — COMPREHENSIVE METABOLIC PANEL
ALT: 15 U/L (ref 0–35)
AST: 16 U/L (ref 0–37)
Alkaline Phosphatase: 48 U/L (ref 39–117)
CO2: 30 mEq/L (ref 19–32)
Sodium: 142 mEq/L (ref 135–145)
Total Bilirubin: 0.3 mg/dL (ref 0.3–1.2)
Total Protein: 7 g/dL (ref 6.0–8.3)

## 2009-06-09 LAB — CBC WITH DIFFERENTIAL/PLATELET
Eosinophils Absolute: 0.1 10*3/uL (ref 0.0–0.5)
MCV: 90.1 fL (ref 79.5–101.0)
MONO%: 4.9 % (ref 0.0–14.0)
NEUT#: 2 10*3/uL (ref 1.5–6.5)
RBC: 4.67 10*6/uL (ref 3.70–5.45)
RDW: 14.1 % (ref 11.2–14.5)
WBC: 3.5 10*3/uL — ABNORMAL LOW (ref 3.9–10.3)
nRBC: 0 % (ref 0–0)

## 2009-06-09 LAB — LACTATE DEHYDROGENASE: LDH: 113 U/L (ref 94–250)

## 2009-06-10 ENCOUNTER — Encounter: Payer: Self-pay | Admitting: Internal Medicine

## 2009-06-16 ENCOUNTER — Encounter: Payer: Self-pay | Admitting: Internal Medicine

## 2009-06-17 ENCOUNTER — Ambulatory Visit: Payer: Self-pay | Admitting: Internal Medicine

## 2009-06-17 LAB — CONVERTED CEMR LAB
Albumin: 4.7 g/dL (ref 3.5–5.2)
Alkaline Phosphatase: 50 units/L (ref 39–117)
BUN: 14 mg/dL (ref 6–23)
Basophils Absolute: 0 10*3/uL (ref 0.0–0.1)
Calcium: 9.6 mg/dL (ref 8.4–10.5)
Eosinophils Relative: 3 % (ref 0–5)
Glucose, Bld: 84 mg/dL (ref 70–99)
HCT: 44.8 % (ref 36.0–46.0)
Hemoglobin: 14.5 g/dL (ref 12.0–15.0)
Lymphocytes Relative: 42 % (ref 12–46)
MCHC: 32.4 g/dL (ref 30.0–36.0)
MCV: 89.6 fL (ref >=78.0)
Monocytes Absolute: 0.3 10*3/uL (ref 0.1–1.0)
Potassium: 4.7 meq/L (ref 3.5–5.3)
RDW: 14.3 % (ref 11.5–15.5)

## 2009-07-06 ENCOUNTER — Ambulatory Visit: Payer: Self-pay | Admitting: Infectious Disease

## 2009-07-06 LAB — CONVERTED CEMR LAB
Blood Glucose, Fingerstick: 76
Cholesterol: 264 mg/dL — ABNORMAL HIGH (ref 0–200)
Total CHOL/HDL Ratio: 6.9

## 2009-07-07 ENCOUNTER — Encounter: Payer: Self-pay | Admitting: Infectious Disease

## 2009-07-07 LAB — CONVERTED CEMR LAB
Basophils Relative: 0 % (ref 0–1)
CO2: 24 meq/L (ref 19–32)
Creatinine, Ser: 0.73 mg/dL (ref 0.40–1.20)
Eosinophils Absolute: 0.2 10*3/uL (ref 0.0–0.7)
Glucose, Bld: 95 mg/dL (ref 70–99)
HCT: 45.4 % (ref 36.0–46.0)
HIV 1 RNA Quant: 680 copies/mL — ABNORMAL HIGH (ref <48)
Hemoglobin: 14.3 g/dL (ref 12.0–15.0)
MCHC: 31.5 g/dL (ref 30.0–36.0)
MCV: 94 fL (ref >=78.0)
Monocytes Absolute: 0.2 10*3/uL (ref 0.1–1.0)
Monocytes Relative: 4 % (ref 3–12)
RBC: 4.83 M/uL (ref 3.87–5.11)
Total Bilirubin: 0.4 mg/dL (ref 0.3–1.2)
Total Protein: 7.2 g/dL (ref 6.0–8.3)

## 2009-07-10 ENCOUNTER — Encounter: Admission: RE | Admit: 2009-07-10 | Discharge: 2009-07-10 | Payer: Self-pay | Admitting: Internal Medicine

## 2009-07-10 LAB — HM MAMMOGRAPHY: HM Mammogram: NEGATIVE

## 2009-07-22 ENCOUNTER — Ambulatory Visit: Payer: Self-pay | Admitting: Sports Medicine

## 2009-07-28 ENCOUNTER — Encounter: Payer: Self-pay | Admitting: Sports Medicine

## 2009-07-29 ENCOUNTER — Encounter: Payer: Self-pay | Admitting: Sports Medicine

## 2009-07-29 ENCOUNTER — Encounter: Admission: RE | Admit: 2009-07-29 | Discharge: 2009-09-24 | Payer: Self-pay | Admitting: Sports Medicine

## 2009-08-17 ENCOUNTER — Ambulatory Visit: Payer: Self-pay | Admitting: Infectious Disease

## 2009-08-17 LAB — CONVERTED CEMR LAB
ALT: 10 U/L
AST: 16 U/L
Albumin: 4.5 g/dL
Alkaline Phosphatase: 48 U/L
BUN: 15 mg/dL
Basophils Absolute: 0 K/uL
Basophils Relative: 0 %
Blood Glucose, Fingerstick: 109
CO2: 28 meq/L
Calcium: 9.7 mg/dL
Chloride: 100 meq/L
Cholesterol, target level: 200 mg/dL
Creatinine, Ser: 0.8 mg/dL
Eosinophils Absolute: 0.1 K/uL
Eosinophils Relative: 3 %
Glucose, Bld: 114 mg/dL — ABNORMAL HIGH
HCT: 39.7 %
HDL goal, serum: 40 mg/dL
HIV 1 RNA Quant: <48 {copies}/mL
HIV-1 RNA Quant, Log: <1.68
Hemoglobin: 13.4 g/dL
Hgb A1c MFr Bld: 5.1 %
LDL Goal: 100 mg/dL
Lymphocytes Relative: 39 %
Lymphs Abs: 1.9 K/uL
MCHC: 33.8 g/dL
MCV: 93 fL
Monocytes Absolute: 0.3 K/uL
Monocytes Relative: 6 %
Neutro Abs: 2.6 K/uL
Neutrophils Relative %: 53 %
Platelets: 155 K/uL
Potassium: 3.9 meq/L
RBC: 4.27 M/uL
RDW: 19.7 % — ABNORMAL HIGH
Sodium: 139 meq/L
Total Bilirubin: 0.4 mg/dL
Total Protein: 6.9 g/dL
WBC: 4.9 10*3/microliter

## 2009-08-18 ENCOUNTER — Encounter: Payer: Self-pay | Admitting: Sports Medicine

## 2009-09-03 ENCOUNTER — Telehealth: Payer: Self-pay | Admitting: Internal Medicine

## 2009-09-16 ENCOUNTER — Ambulatory Visit: Payer: Self-pay | Admitting: Internal Medicine

## 2009-09-16 ENCOUNTER — Telehealth: Payer: Self-pay | Admitting: Internal Medicine

## 2009-09-16 LAB — CONVERTED CEMR LAB
Lymphs Abs: 1.8 10*3/uL (ref 0.7–4.0)
Monocytes Relative: 5 % (ref 3–12)
Neutro Abs: 2.7 10*3/uL (ref 1.7–7.7)
Neutrophils Relative %: 56 % (ref 43–77)
Platelets: 169 10*3/uL (ref 150–400)
RBC: 4.09 M/uL (ref 3.87–5.11)
WBC: 4.9 10*3/uL (ref 4.0–10.5)

## 2009-09-23 ENCOUNTER — Ambulatory Visit: Payer: Self-pay | Admitting: Internal Medicine

## 2009-09-23 LAB — CONVERTED CEMR LAB
ALT: 9 units/L (ref 0–35)
AST: 12 units/L (ref 0–37)
Albumin: 4.3 g/dL (ref 3.5–5.2)
Alkaline Phosphatase: 50 units/L (ref 39–117)
Basophils Absolute: 0 10*3/uL (ref 0.0–0.1)
Cholesterol: 201 mg/dL — ABNORMAL HIGH (ref 0–200)
Glucose, Bld: 96 mg/dL (ref 70–99)
HCT: 39.3 % (ref 36.0–46.0)
HDL: 35 mg/dL — ABNORMAL LOW (ref >39)
Lymphocytes Relative: 39 % (ref 12–46)
MCHC: 32.8 g/dL (ref 30.0–36.0)
MCV: 101.3 fL — ABNORMAL HIGH (ref >=78.0)
Monocytes Absolute: 0.2 10*3/uL (ref 0.1–1.0)
Neutrophils Relative %: 53 % (ref 43–77)
Platelets: 134 10*3/uL — ABNORMAL LOW (ref 150–400)
Potassium: 3.8 meq/L (ref 3.5–5.3)
RBC: 3.88 M/uL (ref 3.87–5.11)
Sodium: 140 meq/L (ref 135–145)
Total CHOL/HDL Ratio: 5.7
Total Protein: 6.8 g/dL (ref 6.0–8.3)
WBC: 3.9 10*3/uL — ABNORMAL LOW (ref 4.0–10.5)

## 2009-10-02 DIAGNOSIS — D649 Anemia, unspecified: Secondary | ICD-10-CM

## 2009-10-02 HISTORY — DX: Anemia, unspecified: D64.9

## 2009-10-21 ENCOUNTER — Telehealth: Payer: Self-pay | Admitting: Internal Medicine

## 2009-10-22 ENCOUNTER — Ambulatory Visit: Payer: Self-pay | Admitting: Infectious Disease

## 2009-10-22 ENCOUNTER — Encounter: Payer: Self-pay | Admitting: Internal Medicine

## 2009-10-22 LAB — CONVERTED CEMR LAB
Basophils Relative: 0 % (ref 0–1)
CO2: 23 meq/L (ref 19–32)
Calcium: 8.8 mg/dL (ref 8.4–10.5)
Chloride: 104 meq/L (ref 96–112)
Creatinine, Ser: 0.77 mg/dL (ref 0.40–1.20)
Eosinophils Absolute: 0.1 10*3/uL (ref 0.0–0.7)
Glucose, Bld: 95 mg/dL (ref 70–99)
HCT: 41.5 % (ref 36.0–46.0)
HIV 1 RNA Quant: <48 copies/mL (ref <48)
Hemoglobin: 13.5 g/dL (ref 12.0–15.0)
Hepatitis B Surface Ag: NEGATIVE
MCHC: 32.5 g/dL (ref 30.0–36.0)
MCV: 106.7 fL — ABNORMAL HIGH (ref 78.0–100.0)
Monocytes Absolute: 0.2 10*3/uL (ref 0.1–1.0)
Monocytes Relative: 7 % (ref 3–12)
RBC: 3.89 M/uL (ref 3.87–5.11)
Total Bilirubin: 0.5 mg/dL (ref 0.3–1.2)
Total CK: 42 units/L (ref 7–177)
Total Protein: 6.8 g/dL (ref 6.0–8.3)

## 2009-11-02 ENCOUNTER — Telehealth: Payer: Self-pay | Admitting: Internal Medicine

## 2009-11-02 ENCOUNTER — Ambulatory Visit: Payer: Self-pay | Admitting: Internal Medicine

## 2009-11-02 DIAGNOSIS — D539 Nutritional anemia, unspecified: Secondary | ICD-10-CM | POA: Insufficient documentation

## 2009-11-02 DIAGNOSIS — L0292 Furuncle, unspecified: Secondary | ICD-10-CM | POA: Insufficient documentation

## 2009-11-02 DIAGNOSIS — L0293 Carbuncle, unspecified: Secondary | ICD-10-CM

## 2009-11-02 DIAGNOSIS — R300 Dysuria: Secondary | ICD-10-CM | POA: Insufficient documentation

## 2009-11-02 LAB — CONVERTED CEMR LAB
BUN: 11 mg/dL (ref 6–23)
Basophils Relative: 0 % (ref 0–1)
CO2: 25 meq/L (ref 19–32)
Calcium: 9.1 mg/dL (ref 8.4–10.5)
Chloride: 104 meq/L (ref 96–112)
Creatinine, Ser: 0.8 mg/dL (ref 0.40–1.20)
Eosinophils Absolute: 0.1 10*3/uL (ref 0.0–0.7)
Eosinophils Relative: 3 % (ref 0–5)
Glucose, Bld: 101 mg/dL — ABNORMAL HIGH (ref 70–99)
HCT: 40.2 % (ref 36.0–46.0)
Lymphs Abs: 1.5 10*3/uL (ref 0.7–4.0)
MCHC: 32.6 g/dL (ref 30.0–36.0)
MCV: 106.1 fL — ABNORMAL HIGH (ref >=78.0)
Magnesium: 1.8 mg/dL (ref 1.5–2.5)
Monocytes Absolute: 0.2 10*3/uL (ref 0.1–1.0)
Monocytes Relative: 6 % (ref 3–12)
Neutrophils Relative %: 48 % (ref 43–77)
Nitrite: NEGATIVE
Protein, ur: NEGATIVE mg/dL
RBC: 3.79 M/uL — ABNORMAL LOW (ref 3.87–5.11)
Specific Gravity, Urine: 1.01 (ref 1.005–1.0)
Total Bilirubin: 0.5 mg/dL (ref 0.3–1.2)
Urobilinogen, UA: 0.2 (ref 0.0–1.0)
Vitamin B-12: 411 pg/mL (ref 211–911)

## 2009-11-04 ENCOUNTER — Telehealth: Payer: Self-pay | Admitting: Internal Medicine

## 2009-11-17 ENCOUNTER — Ambulatory Visit: Payer: Self-pay | Admitting: Gastroenterology

## 2009-11-17 DIAGNOSIS — K5900 Constipation, unspecified: Secondary | ICD-10-CM

## 2009-11-17 HISTORY — DX: Constipation, unspecified: K59.00

## 2009-11-17 LAB — CONVERTED CEMR LAB
Ferritin: 22.8 ng/mL (ref 10.0–291.0)
IgA: 220 mg/dL (ref 68–378)
Tissue Transglutaminase Ab, IgA: 5.4 units (ref <20)
Transferrin: 255.5 mg/dL (ref 212.0–360.0)
Vitamin B-12: 301 pg/mL (ref 211–911)

## 2009-11-20 ENCOUNTER — Telehealth: Payer: Self-pay | Admitting: Gastroenterology

## 2009-12-08 ENCOUNTER — Encounter (INDEPENDENT_AMBULATORY_CARE_PROVIDER_SITE_OTHER): Payer: Self-pay | Admitting: *Deleted

## 2009-12-08 ENCOUNTER — Encounter: Admission: RE | Admit: 2009-12-08 | Discharge: 2009-12-08 | Payer: Self-pay | Admitting: Obstetrics & Gynecology

## 2009-12-08 ENCOUNTER — Ambulatory Visit: Payer: Self-pay | Admitting: Gastroenterology

## 2010-01-07 ENCOUNTER — Ambulatory Visit: Payer: Self-pay | Admitting: Gastroenterology

## 2010-01-07 ENCOUNTER — Ambulatory Visit (HOSPITAL_COMMUNITY): Admission: RE | Admit: 2010-01-07 | Discharge: 2010-01-07 | Payer: Self-pay | Admitting: Gastroenterology

## 2010-01-08 ENCOUNTER — Telehealth: Payer: Self-pay | Admitting: *Deleted

## 2010-01-19 ENCOUNTER — Ambulatory Visit (HOSPITAL_COMMUNITY): Admission: RE | Admit: 2010-01-19 | Discharge: 2010-01-19 | Payer: Self-pay | Admitting: Internal Medicine

## 2010-01-19 ENCOUNTER — Ambulatory Visit: Payer: Self-pay | Admitting: Internal Medicine

## 2010-01-19 DIAGNOSIS — F1120 Opioid dependence, uncomplicated: Secondary | ICD-10-CM | POA: Insufficient documentation

## 2010-01-19 DIAGNOSIS — E881 Lipodystrophy, not elsewhere classified: Secondary | ICD-10-CM | POA: Insufficient documentation

## 2010-01-19 DIAGNOSIS — R1901 Right upper quadrant abdominal swelling, mass and lump: Secondary | ICD-10-CM

## 2010-01-19 LAB — CONVERTED CEMR LAB
ALT: 11 units/L (ref 0–35)
AST: 15 units/L (ref 0–37)
Albumin: 4.6 g/dL (ref 3.5–5.2)
Alkaline Phosphatase: 55 units/L (ref 39–117)
BUN: 14 mg/dL (ref 6–23)
Basophils Absolute: 0 10*3/uL (ref 0.0–0.1)
Basophils Relative: 0 % (ref 0–1)
Blood Glucose, Fingerstick: 102
Calcium: 9.7 mg/dL (ref 8.4–10.5)
Chloride: 103 meq/L (ref 96–112)
Creatinine, Ser: 0.86 mg/dL (ref 0.40–1.20)
Eosinophils Absolute: 0.1 10*3/uL (ref 0.0–0.7)
Eosinophils Relative: 3 % (ref 0–5)
HCT: 41.9 % (ref 36.0–46.0)
HIV 1 RNA Quant: 90 copies/mL — ABNORMAL HIGH (ref <48)
Hemoglobin: 14.2 g/dL (ref 12.0–15.0)
MCHC: 33.9 g/dL (ref 30.0–36.0)
Monocytes Absolute: 0.2 10*3/uL (ref 0.1–1.0)
Platelets: 159 10*3/uL (ref 150–400)
Potassium: 4.1 meq/L (ref 3.5–5.3)
RDW: 13.8 % (ref 11.5–15.5)

## 2010-01-21 ENCOUNTER — Ambulatory Visit (HOSPITAL_COMMUNITY): Admission: RE | Admit: 2010-01-21 | Discharge: 2010-01-21 | Payer: Self-pay | Admitting: Internal Medicine

## 2010-02-03 ENCOUNTER — Ambulatory Visit: Payer: Self-pay | Admitting: Infectious Disease

## 2010-02-04 ENCOUNTER — Ambulatory Visit: Payer: Self-pay | Admitting: Sports Medicine

## 2010-02-19 ENCOUNTER — Ambulatory Visit: Payer: Self-pay | Admitting: Infectious Disease

## 2010-03-24 ENCOUNTER — Ambulatory Visit: Payer: Self-pay | Admitting: Infectious Disease

## 2010-03-25 ENCOUNTER — Encounter (INDEPENDENT_AMBULATORY_CARE_PROVIDER_SITE_OTHER): Payer: Self-pay | Admitting: *Deleted

## 2010-04-09 ENCOUNTER — Ambulatory Visit: Payer: Self-pay | Admitting: Internal Medicine

## 2010-04-15 ENCOUNTER — Ambulatory Visit: Payer: Self-pay | Admitting: Sports Medicine

## 2010-04-15 ENCOUNTER — Telehealth: Payer: Self-pay | Admitting: Internal Medicine

## 2010-04-15 DIAGNOSIS — M79609 Pain in unspecified limb: Secondary | ICD-10-CM

## 2010-04-21 ENCOUNTER — Encounter: Payer: Self-pay | Admitting: Internal Medicine

## 2010-05-05 ENCOUNTER — Ambulatory Visit: Payer: Self-pay | Admitting: Internal Medicine

## 2010-05-05 LAB — CONVERTED CEMR LAB
Benzodiazepines.: POSITIVE
Phencyclidine (PCP): NEGATIVE
Specific Gravity, Urine: NORMAL
pH: NORMAL

## 2010-05-06 ENCOUNTER — Encounter: Payer: Self-pay | Admitting: Internal Medicine

## 2010-05-07 ENCOUNTER — Encounter: Payer: Self-pay | Admitting: Infectious Disease

## 2010-05-09 LAB — CONVERTED CEMR LAB
Benzodiazepines.: NEGATIVE
Creatinine,U: 17.5 mg/dL
Marijuana Metabolite: NEGATIVE
Methadone: NEGATIVE
Phencyclidine (PCP): NEGATIVE
Propoxyphene: NEGATIVE

## 2010-05-11 ENCOUNTER — Encounter: Payer: Self-pay | Admitting: Internal Medicine

## 2010-05-20 ENCOUNTER — Ambulatory Visit: Payer: Self-pay | Admitting: Sports Medicine

## 2010-05-20 ENCOUNTER — Encounter: Payer: Self-pay | Admitting: Internal Medicine

## 2010-05-20 ENCOUNTER — Ambulatory Visit: Payer: Self-pay | Admitting: Infectious Disease

## 2010-05-20 DIAGNOSIS — R269 Unspecified abnormalities of gait and mobility: Secondary | ICD-10-CM | POA: Insufficient documentation

## 2010-05-20 LAB — CONVERTED CEMR LAB
ALT: 11 units/L (ref 0–35)
AST: 15 units/L (ref 0–37)
Albumin: 4.4 g/dL (ref 3.5–5.2)
Alkaline Phosphatase: 51 units/L (ref 39–117)
BUN: 16 mg/dL (ref 6–23)
HIV-1 RNA Quant, Log: <1.3 (ref <1.30)
Hemoglobin: 13 g/dL (ref 12.0–15.0)
MCHC: 33.3 g/dL (ref 30.0–36.0)
Platelets: 174 10*3/uL (ref 150–400)
Potassium: 3.7 meq/L (ref 3.5–5.3)
RDW: 14.5 % (ref 11.5–15.5)
Sodium: 140 meq/L (ref 135–145)

## 2010-05-21 ENCOUNTER — Ambulatory Visit: Payer: Self-pay | Admitting: Infectious Disease

## 2010-06-04 ENCOUNTER — Ambulatory Visit: Payer: Self-pay | Admitting: Oncology

## 2010-06-08 ENCOUNTER — Encounter: Payer: Self-pay | Admitting: Infectious Disease

## 2010-06-08 ENCOUNTER — Ambulatory Visit (HOSPITAL_COMMUNITY)
Admission: RE | Admit: 2010-06-08 | Discharge: 2010-06-08 | Payer: Self-pay | Source: Home / Self Care | Attending: Oncology | Admitting: Oncology

## 2010-06-08 LAB — COMPREHENSIVE METABOLIC PANEL
AST: 14 U/L (ref 0–37)
Albumin: 4.1 g/dL (ref 3.5–5.2)
BUN: 13 mg/dL (ref 6–23)
Calcium: 8.8 mg/dL (ref 8.4–10.5)
Chloride: 106 mEq/L (ref 96–112)
Potassium: 3.8 mEq/L (ref 3.5–5.3)
Sodium: 141 mEq/L (ref 135–145)
Total Protein: 6.7 g/dL (ref 6.0–8.3)

## 2010-06-08 LAB — CBC WITH DIFFERENTIAL/PLATELET
Eosinophils Absolute: 0.2 10*3/uL (ref 0.0–0.5)
LYMPH%: 29 % (ref 14.0–49.7)
MCHC: 34.9 g/dL (ref 31.5–36.0)
MCV: 104.2 fL — ABNORMAL HIGH (ref 79.5–101.0)
MONO%: 4.7 % (ref 0.0–14.0)
NEUT#: 3 10*3/uL (ref 1.5–6.5)
NEUT%: 62.8 % (ref 38.4–76.8)
Platelets: 190 10*3/uL (ref 145–400)
RBC: 3.78 10*6/uL (ref 3.70–5.45)

## 2010-06-08 LAB — MORPHOLOGY

## 2010-06-09 ENCOUNTER — Ambulatory Visit: Payer: Self-pay | Admitting: Infectious Disease

## 2010-06-09 ENCOUNTER — Encounter: Payer: Self-pay | Admitting: Internal Medicine

## 2010-06-09 DIAGNOSIS — B009 Herpesviral infection, unspecified: Secondary | ICD-10-CM | POA: Insufficient documentation

## 2010-06-09 DIAGNOSIS — R609 Edema, unspecified: Secondary | ICD-10-CM

## 2010-06-09 DIAGNOSIS — B3781 Candidal esophagitis: Secondary | ICD-10-CM

## 2010-06-09 DIAGNOSIS — G4700 Insomnia, unspecified: Secondary | ICD-10-CM

## 2010-06-09 LAB — CONVERTED CEMR LAB
ALT: 12 units/L (ref 0–35)
AST: 14 units/L (ref 0–37)
Blood in Urine, dipstick: NEGATIVE
Blood, UA: NEGATIVE
CO2: 28 meq/L (ref 19–32)
Calcium: 9.1 mg/dL (ref 8.4–10.5)
Chloride: 105 meq/L (ref 96–112)
Glucose, Urine, Semiquant: NEGATIVE
Ketones, ur: NEGATIVE mg/dL
MCV: 104.3 fL — ABNORMAL HIGH (ref 78.0–100.0)
Nitrite: NEGATIVE
Platelets: 171 10*3/uL (ref 150–400)
Pro B Natriuretic peptide (BNP): 3.8 pg/mL (ref 0.0–100.0)
Protein, U semiquant: NEGATIVE
Protein, ur: NEGATIVE mg/dL
RDW: 14.4 % (ref 11.5–15.5)
Sodium: 140 meq/L (ref 135–145)
TSH: 1.826 microintl units/mL (ref 0.350–4.500)
Total Bilirubin: 0.4 mg/dL (ref 0.3–1.2)
Total Protein: 6.8 g/dL (ref 6.0–8.3)
Urobilinogen, UA: 0.2 (ref 0.0–1.0)
WBC: 5.8 10*3/uL (ref 4.0–10.5)

## 2010-06-10 ENCOUNTER — Encounter: Payer: Self-pay | Admitting: Internal Medicine

## 2010-06-18 ENCOUNTER — Encounter: Payer: Self-pay | Admitting: Internal Medicine

## 2010-06-18 ENCOUNTER — Encounter: Payer: Self-pay | Admitting: Infectious Disease

## 2010-06-22 ENCOUNTER — Encounter: Payer: Self-pay | Admitting: Infectious Disease

## 2010-06-30 ENCOUNTER — Telehealth: Payer: Self-pay | Admitting: Internal Medicine

## 2010-06-30 ENCOUNTER — Encounter: Payer: Self-pay | Admitting: Internal Medicine

## 2010-07-06 ENCOUNTER — Telehealth: Payer: Self-pay | Admitting: Internal Medicine

## 2010-07-15 ENCOUNTER — Ambulatory Visit
Admission: RE | Admit: 2010-07-15 | Discharge: 2010-07-15 | Payer: Self-pay | Source: Home / Self Care | Attending: Internal Medicine | Admitting: Internal Medicine

## 2010-07-25 ENCOUNTER — Encounter: Payer: Self-pay | Admitting: Oncology

## 2010-07-25 ENCOUNTER — Encounter: Payer: Self-pay | Admitting: Gastroenterology

## 2010-07-26 ENCOUNTER — Encounter (INDEPENDENT_AMBULATORY_CARE_PROVIDER_SITE_OTHER): Payer: Self-pay | Admitting: *Deleted

## 2010-08-05 NOTE — Progress Notes (Signed)
Summary: refill/gg  Phone Note Refill Request  on October 21, 2009 3:33 PM  Refills Requested: Medication #1:  VOLTAREN 1 %  GEL Use twice daily   Last Refilled: 03/03/2009  Method Requested: Electronic Initial call taken by: Merrie Roof RN,  October 21, 2009 3:33 PM  Follow-up for Phone Call        Refill approved-nurse to complete Follow-up by: [name]  DO,  October 22, 2009 9:09 AM    Prescriptions: VOLTAREN 1 %  GEL (DICLOFENAC SODIUM) Use twice daily, as needed for pain.  #1 x 11   Entered and Authorized by:   [name]  DO   Signed by:   [name]  DO on 10/22/2009   Method used:   Electronically to        [facility] (retail)       [address]       [place], [place]  [id number]       Ph: [phone]       Fax: [phone]   RxID:   [id number]

## 2010-08-05 NOTE — Progress Notes (Signed)
Summary: [facility]- Referral Form  [facility]- Referral Form   Imported By: Marily Memos 08/03/2009 08:37:22  _____________________________________________________________________  External Attachment:    Type:   Image     Comment:   External Document

## 2010-08-05 NOTE — Miscellaneous (Signed)
Summary: Orders Update  Clinical Lists Changes  Orders: Added new Test order of T-Culture, Urine ([id number]) - Signed Added new Test order of T- * Misc. Laboratory test [phone]) - Signed Observations: Added new observation of PH URINE: NORMAL (05/05/2010 13:54) Added new observation of SPEC GR URIN: NORMAL (05/05/2010 13:54) Added new observation of DRUGSCRNOXID: NORMAL (05/05/2010 13:54) Added new observation of TEMPERATURE: UNABLE TO READ  LOW VOLUME (05/05/2010 13:54) Added new observation of BENZODIAZ UR: POS (05/05/2010 13:54) Added new observation of METHADONEURN: NEG (05/05/2010 13:54) Added new observation of MARIJUANAURN: NEG (05/05/2010 13:54) Added new observation of PHENCY SCR U: NEG (05/05/2010 13:54) Added new observation of TRICYCLIC UR: POS (05/05/2010 13:54) Added new observation of ECSTASYURINE: NEG (05/05/2010 13:54) Added new observation of COCAINE UR: NEG (05/05/2010 13:54) Added new observation of OXYCODONE: NEG (05/05/2010 13:54) Added new observation of AMPHETAMI UR: NEG (05/05/2010 13:54) Added new observation of BARBITURA UR: NEG (05/05/2010 13:54) Added new observation of OPIATES UR: POS (05/05/2010 13:54) Added new observation of METHAMP SCR: NEG (05/05/2010 13:54)     Process Orders Check Orders Results:     [facility]: ABN not required for this insurance Order queued for requisitioning for [facility]: May 06, 2010 11:56 AM Tests Sent for requisitioning (May 09, 2010 5:20 PM):     05/05/2010: [facility] -- T-Culture, Urine [[id number]] (signed)     05/05/2010: [facility] -- T- * Misc. Laboratory test [phone] (signed)

## 2010-08-05 NOTE — Procedures (Signed)
Summary: Upper Endoscopy  Patient: Onya Eutsler Note: All result statuses are Final unless otherwise noted.  Tests: (1) Upper Endoscopy (EGD)   EGD Upper Endoscopy       DONE     [facility]     [address] [place], [place]  [address]           ENDOSCOPY PROCEDURE REPORT           PATIENT:  Earla, Charlie  MR#:  [id number]     BIRTHDATE:  Dec 10, 1965, 44 yrs. old  GENDER:  female     ENDOSCOPIST:  Rachael Fee, MD     PROCEDURE DATE:  01/07/2010     PROCEDURE:  EGD, diagnostic     ASA CLASS:  Class II     INDICATIONS:  nausea, vomiting; Had EGD in 2003 and also in 2007     by Dr. Corinda Gubler (in 2007 the exam was normal except for mild     "alkaline gastritis")     MEDICATIONS:  MAC sedation, administered by CRNA     TOPICAL ANESTHETIC:  none           DESCRIPTION OF PROCEDURE:   After the risks benefits and     alternatives of the procedure were thoroughly explained, informed     consent was obtained.  The  endoscope was introduced through the     mouth and advanced to the second portion of the duodenum, without     limitations.  The instrument was slowly withdrawn as the mucosa     was fully examined.     <<PROCEDUREIMAGES>>     The upper, middle, and distal third of the esophagus were     carefully inspected and no abnormalities were noted. The z-line     was well seen at the GEJ. The endoscope was pushed into the fundus     which was normal including a retroflexed view. The antrum,gastric     body, first and second part of the duodenum were unremarkable (see     image1, image2, image3, image4, image5, and image7).     Retroflexed views revealed no abnormalities.    The scope was then     withdrawn from the patient and the procedure completed.           COMPLICATIONS:  None           ENDOSCOPIC IMPRESSION:     1) Normal EGD           RECOMMENDATIONS:     1) Percocet can cause nausea/vomiting. Should try to use this as     infrequently as possible.  Many of  your other medicines list     nausea/vomiting as a common side effect (zomig, prestiza,     combivir, norvir, amitiza) and those could be contributing as     well.2) Follow up with Dr. Sheryn Bison as needed           ______________________________     Rachael Fee, MD           n.     eSIGNED:   Rachael Fee at 01/07/2010 12:07 PM           Alethia Berthold, [id number]  Note: An exclamation mark (!) indicates a result that was not dispersed into the flowsheet. Document Creation Date: 01/07/2010 12:07 PM _______________________________________________________________________  (1) Order result status: Final Collection or observation date-time: 01/07/2010 12:02 Requested date-time:  Receipt  date-time:  Reported date-time:  Referring Physician:   Ordering Physician: Rob Bunting [phone]) Specimen Source:  Source: Launa Grill Order Number: [id number] Lab site:

## 2010-08-05 NOTE — Assessment & Plan Note (Signed)
Summary: COUMADIN/VS  Anticoagulant Therapy Managed by: Barbera Setters. Alexandria Lodge III  PharmD CACP [facility] Attending: Eliseo Gum MD Indication 1: Deep vein thrombus Indication 2: Aftercare long term use Anticoagulants V58.61 Start date: 09/01/2006 Duration: 1 year  Patient Assessment Reviewed by: Chancy Milroy PharmD  October 23, 2006 Medication review: verified warfarin dosage & schedule,verified previous prescription medications, verified doses & any changes, verified new medications, reviewed OTC medications, reviewed OTC health products-vitamins supplements etc Complications: none Dietary changes: none   Health status changes: none   Lifestyle changes: none   Recent/future hospitalizations: none   Recent/future procedures: none   Recent/future dental: none         Have you MISSED ANY DOSES or CHANGED TABLETS?  No missed Warfarin doses or changed tablets.  Have you had any BRUISING or BLEEDING ( nose or gum bleeds,blood in urine or stool)?   No reported bruising or bleeding in nose, gums, urine, stool.   Have you STARTED or STOPPED any MEDICATIONS, including OTC meds,herbals or supplements?  No other medications or herbal supplements were started or stopped.   Have you CHANGED your DIET, especially green vegetables,or ALCOHOL intake?   No changes in diet or alcohol intake.  Have you had any ILLNESSES or HOSPITALIZATIONS?    No reported illnesses or hospitalizations   Have you had any signs of CLOTTING?(chest discomfort,dizziness,shortness of breath,arms tingling,slurred speech,swelling or redness in leg)    No chest discomfort, dizziness, shortness of breath, tingling in arm, slurred speech, swelling, or redness in leg.   Target INR: 2.0-3.0 INR: 2.1  Date: 10/23/2006  INR reflects current regimen: 2.1   New  Tablet strength: : 5mg  Regimen Out:     Sunday: 2 Tablet     Monday: 2 Tablet     Tuesday: 2 Tablet     Wednesday: 2 Tablet     Thursday: 2 Tablet      Friday: 2 Tablet  Saturday: 2 Tablet Total Weekly: 70.0mg /week mg  Next INR Due: 11/06/2006 Adjusted by: Barbera Setters. Alexandria Lodge III PharmD CACP   Return to anticoagulation clinic:  11/06/2006

## 2010-08-05 NOTE — Assessment & Plan Note (Signed)
Summary: B-12 + Flu vaccine   Vitals Entered By: Jennet Maduro RN (March 24, 2010 10:21 AM)  Medical History Prior Medications: NEXIUM 40 MG CPDR (ESOMEPRAZOLE MAGNESIUM) Take 1 tablet by mouth two times a day ZOMIG 5 MG TABS (ZOLMITRIPTAN) Take 1 tablet by mouth once a day as needed migraine PERCOCET 5-325 MG TABS (OXYCODONE-ACETAMINOPHEN) Take 1 tablet by mouth every 6 hours as needed ALPRAZOLAM 2 MG TABS (ALPRAZOLAM) Take 1 tablet by mouth three times a day TRICOR 145 MG  TABS (FENOFIBRATE) take 1 tabelt by mouth once daily TRANSDERM-SCOP  PT72 (SCOPOLAMINE BASE PT72) Apply every 48 hours as needed SOMA 250 MG TABS (CARISOPRODOL) one tab by mouth three times a day prn GYNE-LOTRIMIN 1 %  CREA (CLOTRIMAZOLE) One application as directed. Pharmacy may substitute equivalent VOLTAREN 1 %  GEL (DICLOFENAC SODIUM) Use twice daily, as needed for pain. TERAZOL 7 0.4 % CREA (TERCONAZOLE) Use as directed VANIQA 13.9 % CREA (EFLORNITHINE HCL) Apply as directed FLUCONAZOLE 100 MG TABS (FLUCONAZOLE) Take one pill daily as needed for yeast infection/thrush TOPAMAX 50 MG TABS (TOPIRAMATE) Start one pill dailly for one week then increase to twice daily until your next visit PREZISTA 400 MG TABS (DARUNAVIR ETHANOLATE) take two tablets once a day with norvir and viread VIREAD 300 MG TABS (TENOFOVIR DISOPROXIL FUMARATE) take once a day with two prezistas and one norvir COMBIVIR 150-300 MG TABS (LAMIVUDINE-ZIDOVUDINE) Take 1 tablet by mouth two times a day NORVIR 100 MG TABS (RITONAVIR) take one tablet with the two prezistas and one viread CRESTOR 40 MG TABS (ROSUVASTATIN CALCIUM) Take 1 tablet by mouth once a day (this replaces lipitor) PROMETHAZINE HCL 50 MG TABS (PROMETHAZINE HCL) take one half to one whole tablet up to four times daily as needed nausea HIBICLENS 4 % LIQD (CHLORHEXIDINE GLUCONATE) Use as directed two times a day AMITIZA 8 MCG CAPS (LUBIPROSTONE) two times a day with  meals KRISTALOSE 20 GM  PACK (LACTULOSE) 20 gm q hs DOMEPERIDOME 10 MG () 1 by mouth three times a day MELOXICAM 15 MG TABS (MELOXICAM) 1 tab by mouth daily with food x 14 days, then as needed for pain AVELOX 400 MG TABS (MOXIFLOXACIN HCL) 1 daily for 10 days Current Allergies: ! DARVOCET ! NEURONTIN ! VALIUM ! * ZOFRAN ! PCN ! ULTRAM ! DEPAKOTE Immunizations Administered:  Influenza Vaccine # 1:    Vaccine Type: Fluvax Non-MCR    Site: left deltoid    Mfr: novartis    Dose: 0.5 ml    Route: IM    Given by: Jennet Maduro RN    Exp. Date: 10/03/2010    Lot #: [id number]  Flu Vaccine Consent Questions:    Do you have a history of severe allergic reactions to this vaccine? no    Any prior history of allergic reactions to egg and/or gelatin? no    Do you have a sensitivity to the preservative Thimersol? no    Do you have a past history of Guillan-Barre Syndrome? no    Do you currently have an acute febrile illness? no    Have you ever had a severe reaction to latex? no    Vaccine information given and explained to patient? yes    Are you currently pregnant? 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

## 2010-08-05 NOTE — Progress Notes (Signed)
Summary: Urine Specimen needed  Phone Note Outgoing Call   Call placed by: Angelina Ok RN,  Nov 04, 2009 8:45 AM Call placed to: Patient Summary of Call: Call to pt.  Message left for pt to call the Clinics.  Pt needs to give another urine specimen. Angelina Ok RN  Nov 04, 2009 8:46 AM   Initial call taken by: Angelina Ok RN,  Nov 04, 2009 8:46 AM  Follow-up for Phone Call        please canel resample of urine. UA does not look like infection. Follow-up by: [name]  DO,  Nov 04, 2009 9:25 AM  Additional Follow-up for Phone Call Additional follow up Details #1::        Call to pt to cancel urine specimen.  Dr. Phillips Odor to send in prescription for Keflex.  Pt wanted to know the duration of the prescription as well as questions about her blood work. Additional Follow-up by: Angelina Ok RN,  Nov 04, 2009 9:35 AM

## 2010-08-05 NOTE — Progress Notes (Signed)
Summary: refill/gg  Phone Note Refill Request  on July 06, 2010 5:10 PM  Refills Requested: Medication #1:  SOMA 250 MG TABS one tab by mouth three times a day prn   Last Refilled: 06/03/2010  Method Requested: Electronic Initial call taken by: Merrie Roof RN,  July 06, 2010 5:13 PM  Follow-up for Phone Call        Refill approved-nurse to complete Follow-up by: [name]  DO,  July 08, 2010 2:21 PM  Additional Follow-up for Phone Call Additional follow up Details #1::        Rx faxed to pharmacy Additional Follow-up by: Merrie Roof RN,  July 08, 2010 5:09 PM    Prescriptions: SOMA 250 MG TABS (CARISOPRODOL) one tab by mouth three times a day prn  #90 x 3   Entered and Authorized by:   [name]  DO   Signed by:   [name]  DO on 07/08/2010   Method used:   Telephoned to ...       [facility] (retail)       [address]       [place], [place]  [address]       Ph: [phone]       Fax: [phone]   RxID:   [id number]

## 2010-08-05 NOTE — Assessment & Plan Note (Signed)
Summary: SHOULDER/ARM F/U,MC   Referring [name]:  na Primary [name]:  Sherry Fusi  DO  CC:  f/u R arm and b/l knee pain.  History of Present Illness: 44yo R-hand dominant female to office for f/u R arm pain & bilateral knee pain. Previous visit noted to have small partial tear of R tricep, along with lipomas on MSK U/S.   She has been doing tricep extensions as directed, but continues to have pain in her tricep. Mobic is helpful at times.  Denies any numbness or tingling.  Continues to have bilateral knee pain.   Wearing chopat straps as directed, but don't seem to be helping.  Doing home exercises about 3-times per week. Still having spasms in her legs that are unchanged with amitriptyline. Denies any mechanical symptoms. Denies swelling of the knee.  Has hx of spondylolisthesis which is thought to be contributing to her leg spasms & possibly her knee pain.  Allergies: 1)  ! Darvocet 2)  ! Neurontin 3)  ! Valium 4)  ! * Zofran 5)  ! Pcn 6)  ! Ultram 7)  ! Depakote  Review of Systems      See HPI  Physical Exam  General:  Obese, NAD, pleasant, cooperative, AOx3 Msk:  R arm:  Right Upper Arm with 2-3 cm area of tenderness on lateral aspect of tricep.  No erythema, no increased warmth.  Palpable soft tissue mass on distal lateral aspect of tricep, no tenderness, no erythema, no increased warmth.  Good tricep & bicep strength.  Shoulders held in forward position.  Normal shoulder ROM & normal RTC strength.  KNEE: Left Knee: Normal to inspection with no erythema or effusion or obvious bony abnormalities. TTP over patella, no joint line tenderness, no tenderness over tibial tubercle normal ROM with crepitus. No ligamentous laxity Negative Mcmurray's  Patellar compression is painful. Patellar and quadriceps tendons unremarkable. Weak quadriceps & VMO  R Knee: Normal to inspection with no erythema or effusion or obvious bony abnormalities. Mild TTP over patella, no  joint line tenderness, no tenderness over tibial tubercle normal ROM.  No ligamentous laxity. Negative Mcmurray's  Patellar compression is slightly painful Patellar and quadriceps tendons unremarkable. Slight quadriceps weakness.  FEET:  significant collapse of transverse arches bilaterally, hindfoot valgus - L>R.    GAIT:  slight leg length difference - L leg <1cm shorter than right.  Walks with L hip slightly high.  Pronation of feet bilaterlly. Neurologic:  sensation intact to light touch.   Additional Exam:  MSK U/S: R upper arm- hypoechoic areas again noted within tricep consistant with lipomas.  No signs of partial tricep tear visible today when compared to previous MSK U/S.  No increased doppler flow.  Images saved.   Impression & Recommendations:  Problem # 1:  ARM PAIN, RIGHT (ICD-729.5) - Previous partial tear in R tricep was not visible on MSK u/s today. - Should continue with previous tricep exercises, but add butterflies & rows to help with scapular stabilization - Cont. 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  Cont. to monitor  Problem # 3:  KNEE PAIN, BILATERAL (ICD-719.46) - suspect knee pain largely related to significant breakdown of her feet.  She also has some spasms  in her legs most likely related to hx of spondylolisthesis.   - Fitted with sports insoles with scaphoid pads in office today.  Insoles felt comfortable in office & she commented how much better she felt with them in her shoes - Will increase amitriptyline to 50mg  q hs to help with knee pain & leg spasms - Cont. knee straps - Cont. recumbant bike & cont. exercises to build strength - f/u 6 weeks   Her updated medication list for this problem includes:    Percocet  5-325 Mg Tabs (Oxycodone-acetaminophen) .Marland Kitchen... Take 1 tablet by mouth every 6 hours as needed    Soma 250 Mg Tabs (Carisoprodol) ..... One tab by mouth three times a day prn    Meloxicam 15 Mg Tabs (Meloxicam) .Marland Kitchen... 1 tab by mouth daily with food x 14 days, then as needed for pain  Orders: Sports Insoles (L3510) Foot Orthosis ( Arch Strap/Heel Cup) [phone])  Problem # 4:  ABNORMALITY OF GAIT (ICD-781.2)  - Pronation of feet b/l & significant pes planus - Fitted with sports insoles with scaphoid pads.  Comfortable to patient in office today.  Orders: Sports Insoles [phone]) Foot Orthosis ( Arch Strap/Heel Cup) [phone])  Complete Medication List: 1)  Nexium 40 Mg Cpdr (Esomeprazole magnesium) .... Take 1 tablet by mouth two times a day 2)  Zomig 5 Mg Tabs (Zolmitriptan) .... Take 1 tablet by mouth once a day as needed migraine 3)  Percocet 5-325 Mg Tabs (Oxycodone-acetaminophen) .... Take 1 tablet by mouth every 6 hours as needed 4)  Alprazolam 2 Mg Tabs (Alprazolam) .... Take 1 tablet by mouth three times a day 5)  Tricor 145 Mg Tabs (Fenofibrate) .... Take 1 tabelt by mouth once daily 6)  Transderm-scop Pt72 (Scopolamine base pt72) .... Apply every 48 hours as needed 7)  Soma 250 Mg Tabs (Carisoprodol) .... One tab by mouth three times a day prn 8)  Gyne-lotrimin 1 % Crea (Clotrimazole) .... One application as directed. pharmacy may substitute equivalent 9)  Voltaren 1 % Gel (Diclofenac sodium) .... Use twice daily, as needed for pain. 10)  Terazol 7 0.4 % Crea (Terconazole) .... Use as directed 11)  Vaniqa 13.9 % Crea (Eflornithine hcl) .... Apply as directed 12)  Fluconazole 100 Mg Tabs (Fluconazole) .... Take one pill daily as needed for yeast infection/thrush 13)  Topamax 50 Mg Tabs (Topiramate) .... Start one pill dailly for one week then increase to twice daily until your next visit 14)  Prezista 400 Mg Tabs (Darunavir ethanolate) .... Take two tablets once a day with norvir and  viread 15)  Viread 300 Mg Tabs (Tenofovir disoproxil fumarate) .... Take once a day with two prezistas and one norvir 16)  Combivir 150-300 Mg Tabs (Lamivudine-zidovudine) .... Take 1 tablet by mouth two times a day 17)  Norvir 100 Mg Tabs (Ritonavir) .... Take one tablet with the two prezistas and one viread 18)  Crestor 40 Mg Tabs (Rosuvastatin calcium) .... Take 1 tablet by mouth once a day (this replaces lipitor) 19)  Promethazine Hcl 50 Mg Tabs (Promethazine hcl) .... Take one half to one whole tablet up to four times daily as needed nausea 20)  Hibiclens 4 % Liqd (Chlorhexidine gluconate) .... Use as directed two times a day 21)  Amitiza 8 Mcg Caps (Lubiprostone) .... Two times a day with meals 22)  Kristalose 20 Gm Pack (Lactulose) .... 20 gm q hs 23)  Domeperidome 10 Mg  .Marland KitchenMarland Kitchen. 1 by mouth three times a  day 24)  Meloxicam 15 Mg Tabs (Meloxicam) .Marland Kitchen.. 1 tab by mouth daily with food x 14 days, then as needed for pain 25)  Cyanocobalamin 1000 Mcg/ml Soln (Cyanocobalamin) .... Administer 1.0 ml intramuscularly monthly 26)  Senokot Xtra 17.2 Mg Tabs (Sennosides) .... Take 1-2 tablet by mouth as needed for constipation 27)  Amitriptyline Hcl 25 Mg Tabs (Amitriptyline hcl) .Marland Kitchen.. 1 by mouth qhs 28)  Levaquin 250 Mg Tabs (Levofloxacin) .... Take 1 tablet by mouth once a day  Patient Instructions: 1)  Keep the insoles in all of your shoes to help add some cushion. 2)  Start taking Amitriptyline 25mg  2-tabs at bedtime. 3)  Start doing shoulder exercises with butterflies & rows. 4)  - 3 sets of 15 with about 2-lb dumbells 5)  - do these exercises once daily 6)  Cont. meloxicam as prescribed. 7)  Follow-up in 4-6 weeks, call with any questions or concerns.   Orders Added: 1)  Est. Patient Level IV [04540] 2)  Sports Insoles [L3510] 3)  Foot Orthosis ( Arch Strap/Heel Cup) [J8119]

## 2010-08-05 NOTE — Miscellaneous (Signed)
Summary: RW update  Clinical Lists Changes  Observations: Added new observation of RWPARTICIP: Yes (03/25/2010 9:40)

## 2010-08-05 NOTE — Medication Information (Signed)
Summary: CONTROLLED MEDICATION/UDS  CONTROLLED MEDICATION/UDS   Imported By: Margie Billet 05/31/2010 09:31:41  _____________________________________________________________________  External Attachment:    Type:   Image     Comment:   External Document

## 2010-08-05 NOTE — Progress Notes (Signed)
Summary: Prior Authorization  Phone Note Outgoing Call   Call placed by: Angelina Ok RN,  September 03, 2009 9:53 AM Call placed to: Insurer Summary of Call: Call for Prior Authorization of Zomig 5 mg tablets.  Denied.  Pt will need to try Maxalt-MLT or Imitrex within the last 18 months.  Pt will need a medical raeson as well to take the Zomig. Angelina Ok RN  September 03, 2009 10:08 AM  Initial call taken by: Angelina Ok RN,  September 03, 2009 10:08 AM  Follow-up for Phone Call        Will change her to MAxalt- I discussed the need for this change at her last visit. Follow-up by: [name]  DO,  September 10, 2009 7:07 PM    New/Updated Medications: MAXALT 10 MG  TABS (RIZATRIPTAN BENZOATE) once daily as needed migraine Prescriptions: MAXALT 10 MG  TABS (RIZATRIPTAN BENZOATE) once daily as needed migraine  #10 x 3   Entered and Authorized by:   [name]  DO   Signed by:   [name]  DO on 09/10/2009   Method used:   Electronically to        [facility] (retail)       [phone]       [place], [place]  [id number]       Ph: [phone]       Fax: [phone]   RxID:   [id number]

## 2010-08-05 NOTE — Assessment & Plan Note (Signed)
Summary: F/U/VS   Visit Type:  Follow-up Referring [name]:  na Primary [name]:  Sherry Fusi  DO  CC:  f/u, possible uti, retaining fluid, fatigue, and unable to sleep.  History of Present Illness: 45 year old lady with R HIV virus currently well controlled on her salvage regimen. She had multiple complaints today:  #1 she is sleepign poorly despite her tricyclic and she has been intolerant of sleeping meds. I suggested that she take 1-2 xanax at at bedtime and talk to Dr. Doyce Loose further if she is requring mroe benzodiazepen ore longer [employer] is required.  #2 She has had lower abdominal pain and dysuria and flank pain and thinks she ahs bladder infection  #3 she is concerned about getting yeast infection again after getting abx for #2  #4 she continues to have problems with chrnoic nausea and someitmes will go a few days without being able to keep down ARVs due to vomiting thought typically she can control this with her antiemetics.  #5 she is complaing of increaseing lower extremity edema.  I  spent greater than 45 minutes with the pt including greater than 50% of time counselling pt face to face  Problems Prior to Update: 1)  Abnormality of Gait  (ICD-781.2) 2)  Arm Pain, Right  (ICD-729.5) 3)  Macrocytic Anemia  (ICD-281.9) 4)  Lipodystrophy  (ICD-272.6) 5)  Abdominal Mass, Right Upper Quadrant  (ICD-789.31) 6)  Chronic Pain Syndrome  (ICD-338.4) 7)  Constipation  (ICD-564.00) 8)  Boils, Recurrent  (ICD-680.9) 9)  Dysuria  (ICD-788.1) 10)  Macrocytic Anemia  (ICD-281.9) 11)  Lipomas, Multiple  (ICD-214.9) 12)  Degenerative Disc Disease, Lumbosacral Spine  (ICD-722.52) 13)  Hyperlipidemia  (ICD-272.4) 14)  Acquired Spondylolisthesis  (ICD-738.4) 15)  Iliotibial Band Syndrome, Bilateral  (ICD-728.89) 16)  Sinusitis, Recurrent  (ICD-473.9) 17)  Venous Insufficiency, Mild  (ICD-459.81) 18)  Myalgia  (ICD-729.1) 19)  Knee Pain, Bilateral  (ICD-719.46) 20)   Hirsutism  (ICD-704.1) 21)  Nausea and Vomiting  (ICD-787.01) 22)  Urinary Incontinence  (ICD-788.30) 23)  Hx of Dvt  (ICD-453.40) 24)  Obesity Nos  (ICD-278.00) 25)  Non-hodgkin's Lymphoma, Hx of  (ICD-V10.79) 26)  Peripheral Neuropathy  (ICD-356.9) 27)  Asthma  (ICD-493.90) 28)  Anxiety  (ICD-300.00) 29)  Hyperlipidemia  (ICD-272.4) 30)  Depression  (ICD-311) 31)  Genital Herpes, Hx of  (ICD-V13.8) 32)  Gastroparesis  (ICD-536.3) 33)  Gerd  (ICD-530.81) 34)  Irritable Bowel Syndrome  (ICD-564.1) 35)  HIV Infection  (ICD-042) 36)  Migraine Headache  (ICD-346.90) 37)  Hx of Esophagitis  (ICD-530.10)  Medications Prior to Update: 1)  Nexium 40 Mg Cpdr (Esomeprazole Magnesium) .... Take 1 Tablet By Mouth Two Times A Day 2)  Zomig 5 Mg Tabs (Zolmitriptan) .... Take 1 Tablet By Mouth Once A Day As Needed Migraine 3)  Percocet 5-325 Mg Tabs (Oxycodone-Acetaminophen) .... Take 1 Tablet By Mouth Every 6 Hours As Needed 4)  Alprazolam 2 Mg Tabs (Alprazolam) .... Take 1 Tablet By Mouth Three Times A Day 5)  Tricor 145 Mg  Tabs (Fenofibrate) .... Take 1 Tabelt By Mouth Once Daily 6)  Transderm-Scop  Pt72 (Scopolamine Base Pt72) .... Apply Every 48 Hours As Needed 7)  Soma 250 Mg Tabs (Carisoprodol) .... One Tab By Mouth Three Times A Day Prn 8)  Gyne-Lotrimin 1 %  Crea (Clotrimazole) .... One Application As Directed. Pharmacy May Substitute Equivalent 9)  Voltaren 1 %  Gel (Diclofenac Sodium) .... Use Twice Daily, As Needed For  Pain. 10)  Terazol 7 0.4 % Crea (Terconazole) .... Use As Directed 11)  Vaniqa 13.9 % Crea (Eflornithine Hcl) .... Apply As Directed 12)  Fluconazole 100 Mg Tabs (Fluconazole) .... Take One Pill Daily As Needed For Yeast Infection/thrush 13)  Topamax 50 Mg Tabs (Topiramate) .... Start One Pill Dailly For One Week Then Increase To Twice Daily Until Your Next Visit 14)  Prezista 400 Mg Tabs (Darunavir Ethanolate) .... Take Two Tablets Once A Day With Norvir and  Viread 15)  Viread 300 Mg Tabs (Tenofovir Disoproxil Fumarate) .... Take Once A Day With Two Prezistas and One Norvir 16)  Combivir 150-300 Mg Tabs (Lamivudine-Zidovudine) .... Take 1 Tablet By Mouth Two Times A Day 17)  Norvir 100 Mg Tabs (Ritonavir) .... Take One Tablet With The Two Prezistas and One Viread 18)  Crestor 40 Mg Tabs (Rosuvastatin Calcium) .... Take 1 Tablet By Mouth Once A Day (This Replaces Lipitor) 19)  Promethazine Hcl 50 Mg Tabs (Promethazine Hcl) .... Take One Half To One Whole Tablet Up To Four Times Daily As Needed Nausea 20)  Hibiclens 4 % Liqd (Chlorhexidine Gluconate) .... Use As Directed Two Times A Day 21)  Amitiza 8 Mcg Caps (Lubiprostone) .... Two Times A Day With Meals 22)  Kristalose 20 Gm  Pack (Lactulose) .... 20 Gm Q Hs 23)  Domeperidome 10 Mg .Marland Kitchen.. 1 By Mouth Three Times A Day 24)  Meloxicam 15 Mg Tabs (Meloxicam) .Marland Kitchen.. 1 Tab By Mouth Daily With Food X 14 Days, Then As Needed For Pain 25)  Cyanocobalamin 1000 Mcg/ml Soln (Cyanocobalamin) .... Administer 1.0 Ml Intramuscularly Monthly 26)  Senokot Xtra 17.2 Mg Tabs (Sennosides) .... Take 1-2 Tablet By Mouth As Needed For Constipation 27)  Amitriptyline Hcl 25 Mg Tabs (Amitriptyline Hcl) .Marland Kitchen.. 1 By Mouth Qhs 28)  Levaquin 250 Mg Tabs (Levofloxacin) .... Take 1 Tablet By Mouth Once A Day  Current Medications (verified): 1)  Nexium 40 Mg Cpdr (Esomeprazole Magnesium) .... Take 1 Tablet By Mouth Two Times A Day 2)  Zomig 5 Mg Tabs (Zolmitriptan) .... Take 1 Tablet By Mouth Once A Day As Needed Migraine 3)  Percocet 5-325 Mg Tabs (Oxycodone-Acetaminophen) .... Take 1 Tablet By Mouth Every 6 Hours As Needed 4)  Alprazolam 2 Mg Tabs (Alprazolam) .... Take 1 Tablet By Mouth Three Times A Day 5)  Tricor 145 Mg  Tabs (Fenofibrate) .... Take 1 Tabelt By Mouth Once Daily 6)  Transderm-Scop  Pt72 (Scopolamine Base Pt72) .... Apply Every 48 Hours As Needed 7)  Soma 250 Mg Tabs (Carisoprodol) .... One Tab By Mouth Three Times  A Day Prn 8)  Gyne-Lotrimin 1 %  Crea (Clotrimazole) .... One Application As Directed. Pharmacy May Substitute Equivalent 9)  Voltaren 1 %  Gel (Diclofenac Sodium) .... Use Twice Daily, As Needed For Pain. 10)  Terazol 7 0.4 % Crea (Terconazole) .... Use As Directed 11)  Vaniqa 13.9 % Crea (Eflornithine Hcl) .... Apply As Directed 12)  Fluconazole 100 Mg Tabs (Fluconazole) .... Take One Pill Daily As Needed For Yeast Infection/thrush 13)  Topamax 50 Mg Tabs (Topiramate) .... Start One Pill Dailly For One Week Then Increase To Twice Daily Until Your Next Visit 14)  Prezista 400 Mg Tabs (Darunavir Ethanolate) .... Take Two Tablets Once A Day With Norvir and Viread 15)  Viread 300 Mg Tabs (Tenofovir Disoproxil Fumarate) .... Take Once A Day With Two Prezistas and One Norvir 16)  Combivir 150-300 Mg Tabs (Lamivudine-Zidovudine) .... Take 1 Tablet  By Mouth Two Times A Day 17)  Norvir 100 Mg Tabs (Ritonavir) .... Take One Tablet With The Two Prezistas and One Viread 18)  Crestor 40 Mg Tabs (Rosuvastatin Calcium) .... Take 1 Tablet By Mouth Once A Day (This Replaces Lipitor) 19)  Promethazine Hcl 50 Mg Tabs (Promethazine Hcl) .... Take One Half To One Whole Tablet Up To Four Times Daily As Needed Nausea 20)  Hibiclens 4 % Liqd (Chlorhexidine Gluconate) .... Use As Directed Two Times A Day 21)  Amitiza 8 Mcg Caps (Lubiprostone) .... Two Times A Day With Meals 22)  Kristalose 20 Gm  Pack (Lactulose) .... 20 Gm Q Hs 23)  Domeperidome 10 Mg .Marland Kitchen.. 1 By Mouth Three Times A Day 24)  Meloxicam 15 Mg Tabs (Meloxicam) .Marland Kitchen.. 1 Tab By Mouth Daily With Food X 14 Days, Then As Needed For Pain 25)  Cyanocobalamin 1000 Mcg/ml Soln (Cyanocobalamin) .... Administer 1.0 Ml Intramuscularly Monthly 26)  Senokot Xtra 17.2 Mg Tabs (Sennosides) .... Take 1-2 Tablet By Mouth As Needed For Constipation 27)  Amitriptyline Hcl 25 Mg Tabs (Amitriptyline Hcl) .Marland Kitchen.. 1 By Mouth Qhs 28)  Bactrim Ds 800-160 Mg Tabs  (Sulfamethoxazole-Trimethoprim) .... Take 1 Tablet By Mouth Two Times A Day For 3 Days  Allergies: 1)  ! Darvocet 2)  ! Neurontin 3)  ! Valium 4)  ! * Zofran 5)  ! Pcn 6)  ! Ultram 7)  ! Depakote  Directives: 1)  Pain Contract Violation 05/2010     Current Allergies (reviewed today): ! DARVOCET ! NEURONTIN ! VALIUM ! * ZOFRAN ! PCN ! ULTRAM ! DEPAKOTE Past History:  Past Medical History: Last updated: 04/29/2008 Hx Non-Hodgkins Lymphoma Stage IIA Recurrent Sinusitis History of Peptic ulcer disease-EGD 1/99-Dr. 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  Family History: Last updated: 12/08/2009 Mom - HTN, hx of TIA Dad - died at 96 yo of suicide-(GSW  when she was 15 yrs.) No FH of Colon Cancer:  Social History: Last updated: 03/24/2008 Single Lives alone No ETOH No Tobacco Abuse  Risk Factors: Alcohol Use: 0 (04/09/2010) Caffeine Use: coffee, tea and sodas (10/22/2009) Exercise: yes (10/22/2009)  Risk Factors: Smoking Status: never (04/09/2010)  Family History: Reviewed history from 12/08/2009 and no changes required. Mom - HTN, hx of TIA Dad - died at 10 yo of suicide-(GSW  when she was 15 yrs.) No FH of Colon Cancer:  Social History: Reviewed history from 03/24/2008 and no changes required. Single Lives alone No ETOH No Tobacco Abuse  Review of  Systems       The patient complains of prolonged cough, headaches, and abdominal pain.  The patient denies anorexia, fever, weight loss, weight gain, vision loss, decreased hearing, hoarseness, chest pain, syncope, dyspnea on exertion, peripheral edema, hemoptysis, melena, hematochezia, severe indigestion/heartburn, hematuria, incontinence, genital sores, muscle weakness, suspicious skin lesions, transient blindness, difficulty walking, depression, unusual weight change, abnormal bleeding, enlarged lymph nodes, and angioedema.    Vital Signs:  Patient profile:   45 year old female Menstrual status:  irregular Height:      66  inches (167.64 cm) Weight:      228.25 pounds (103.75 kg) BMI:     36.29 Temp:     98.0 degrees F (36.67 degrees C) oral Pulse rate:   90 / minute BP sitting:   116 / 82  (left arm)  Vitals Entered By: Starleen Arms CMA (June 09, 2010 9:18 AM) CC: f/u, possible uti, retaining fluid, fatigue, unable to sleep Is Patient Diabetic? No Pain Assessment Patient in pain? no      Nutritional Status BMI of > 30 = obese Nutritional Status Detail "sometimes ok"  Does patient need assistance? Functional Status Self care Ambulation Normal        Medication Adherence: 06/09/2010   Adherence to medications reviewed with patient. Counseling to provide adequate adherence provided                                Physical Exam  General:  alert, well-nourished, well-hydrated, and overweight-appearing.   Head:  normocephalic and atraumatic.   Eyes:  vision grossly intact, pupils equal, and pupils round.   Ears:  no external deformities and ear piercing(s) noted.   Nose:  no external deformity, no external erythema, and no nasal discharge.   Mouth:  pharynx pink and moist, no erythema, and no exudates Lungs:  normal respiratory effort, no crackles, and no wheezes.   Heart:  normal rate, regular rhythm, no murmur, no gallop, and no rub.   Abdomen:   tednereness to palpation along lower abdomen and suprapubic area Msk:  normal ROM.   Neurologic:  alert & oriented X3 and strength normal in all extremities.   Skin:  turgor normal and no ecchymoses.   Psych:  Oriented X3, memory intact for recent and remote, normally interactive and in good spirits    Impression & Recommendations:  Problem # 1:  HIV INFECTION (ICD-042) Continued pefect suppression on this salvage regimen The following medications were removed from the medication list:    Levaquin 250 Mg Tabs (Levofloxacin) .Marland Kitchen... Take 1 tablet by mouth once a day Her updated medication list for this problem includes:    Fluconazole 100 Mg Tabs (Fluconazole) .Marland Kitchen... Take one pill daily as needed for yeast infection/thrush    Bactrim Ds 800-160 Mg Tabs (Sulfamethoxazole-trimethoprim) .Marland Kitchen... Take 1 tablet by mouth two times a day for 3 days  Orders: T-Hepatitis A Antibody ([id number]) Est. Patient Level V (99215)Future Orders: T-CD4SP ([facility]) (CD4SP) ... 09/07/2010 T-HIV Viral Load [id number]) ... 09/07/2010 T-CBC w/Diff ([id number]) ... 09/07/2010 T-Comprehensive Metabolic Panel [id number]) ... 09/07/2010  Diagnostics Reviewed:  HIV: CDC-defined AIDS (06/04/2009)   CD4: 570 (05/21/2010)   WBC: 4.2 (05/20/2010)   Hgb: 13.0 (05/20/2010)   HCT: 39.0 (05/20/2010)   Platelets: 174 (05/20/2010) HIV genotype: * (05/13/2009)   HIV-1 RNA: <20 copies/mL (05/20/2010)   HBSAg: NEG (10/22/2009)  Problem # 2:  INSOMNIA (ICD-780.52)  suggestedd she take her xanax at night and double dose at bedtime if needed  Orders: Est. Patient Level V (28413)  Problem # 3:  EDEMA (ICD-782.3) Assessment: New will check bnp and tfs Orders: T-Comprehensive Metabolic Panel [id number]) T-CBC No Diff (36644-03474) T-BNP  (B Natriuretic Peptide) (25956-38756) T-T4, Free (43329-51884) T-TSH (16606-30160) Est. Patient Level V (10932)  Problem # 4:  DYSURIA (ICD-788.1) diipstick was positive, I wrote  for bactrim pending culture The following medications were removed from the medication list:    Levaquin 250 Mg Tabs (Levofloxacin) .Marland Kitchen... Take 1  tablet by mouth once a day Her updated medication list for this problem includes:    Bactrim Ds 800-160 Mg Tabs (Sulfamethoxazole-trimethoprim) .Marland Kitchen... Take 1 tablet by mouth two times a day for 3 days  Orders: T-Urinalysis Dipstick only (16109UE) T-Culture, Urine ([id number]) T-Urinalysis (47829-56213) Est. Patient Level V (08657)  Problem # 5:  NAUSEA AND VOMITING (ICD-787.01)  continue phenergan  Orders: Est. Patient Level V (84696)  Problem # 6:  CANDIDIASIS OF OTHER UROGENITAL SITES (ICD-112.2) she has fluconazole for this Orders: Est. Patient Level V (29528)  Medications Added to Medication List This Visit: 1)  Bactrim Ds 800-160 Mg Tabs (Sulfamethoxazole-trimethoprim) .... Take 1 tablet by mouth two times a day for 3 days  Other Orders: Hepatitis B Vaccine >32yrs (41324) Admin 1st Vaccine (40102)  Patient Instructions: 1)  we will check your urine and if it looks dirty start you on an antibiotic 2)  rtc in 4 months time 3)  Be sure to return for lab work one (1) week before your next appointment as scheduled.  Prescriptions: BACTRIM DS 800-160 MG TABS (SULFAMETHOXAZOLE-TRIMETHOPRIM) Take 1 tablet by mouth two times a day for 3 days  #6 x 2   Entered and Authorized by:   Acey Lav MD   Signed by:   Paulette Blanch Dam MD on 06/09/2010   Method used:   Electronically to        [facility] (retail)       [phone] [address]       [place], [place]  [id number]       Ph: [phone]       Fax: [phone]   RxID:   [id number] PROMETHAZINE HCL 50 MG TABS (PROMETHAZINE HCL) take one half to one whole tablet up to four times daily as needed nausea  #90 x 11   Entered and Authorized by:   Acey Lav MD   Signed by:   Paulette Blanch Dam MD on 06/09/2010   Method used:    Electronically to        [facility] (retail)       [phone] [address]       [place], [place]  [id number]       Ph: [phone]       Fax: [phone]   RxID:   [id number]    Immunizations Administered:  Hepatitis B Vaccine # 2:    Vaccine Type: HepB Adult    Site: left deltoid    Mfr: Merck    Dose: 1.0 ml    Route: IM    Given by: Starleen Arms CMA    Exp. Date: 06/21/2012    Lot #: [id number]    VIS given: 01/18/06 version given June 09, 2010.        Medication Adherence: 06/09/2010   Adherence to medications reviewed with patient. Counseling to provide adequate adherence provided                                Laboratory Results   Urine Tests  Date/Time Received: Mariea Clonts  June 09, 2010 10:22 AM  Date/Time Reported: Mariea Clonts  June 09, 2010 10:22 AM   Mariea Clonts  June 09, 2010 10:22 AM        Routine Urinalysis   Color: yellow Appearance:  Cloudy Glucose: negative   (Normal Range: Negative) Bilirubin: negative   (Normal Range: Negative) Ketone: negative   (Normal Range: Negative) Spec. Gravity: 1.010   (Normal Range: 1.003-1.035) Blood: negative   (Normal Range: Negative) pH: 5.5   (Normal Range: 5.0-8.0) Protein: negative   (Normal Range: Negative) Urobilinogen: 0.2   (Normal Range: 0-1) Nitrite: negative   (Normal Range: Negative) Leukocyte Esterace: trace   (Normal Range: Negative)

## 2010-08-05 NOTE — Miscellaneous (Signed)
  Clinical Lists Changes  Observations: Added new observation of MARITAL STAT: Unknown (07/26/2010 12:19)

## 2010-08-05 NOTE — Procedures (Signed)
Summary: Colonoscopy  Patient: [name] Note: All result statuses are Final unless otherwise noted.  Tests: (1) Colonoscopy (COL)   COL Colonoscopy           DONE     [facility]     [address] [place], [place]  [address]           COLONOSCOPY PROCEDURE REPORT           PATIENT:  [name], [name]  MR#:  [id number]     BIRTHDATE:  Mar 19, 1966, 44 yrs. old  GENDER:  female     ENDOSCOPIST:  Rachael Fee, MD     REF. BY:  Vania Rea. Jarold Motto, M.D.     PROCEDURE DATE:  01/07/2010     PROCEDURE:  Diagnostic Colonoscopy     ASA CLASS:  Class II     INDICATIONS:  chronic constipation; normal flex sig 2003 with Dr.     Corinda Gubler.     MEDICATIONS:   MAC sedation, administered by CRNA           DESCRIPTION OF PROCEDURE:   After the risks benefits and     alternatives of the procedure were thoroughly explained, informed     consent was obtained.  Digital rectal exam was performed and     revealed no rectal masses.   The [id number] ([id number]) endoscope was     introduced through the anus and advanced to the cecum, which was     identified by both the appendix and ileocecal valve, without     limitations.  The quality of the prep was good, using MoviPrep.     The instrument was then slowly withdrawn as the colon was fully     examined.     <<PROCEDUREIMAGES>>           FINDINGS:  A normal appearing cecum, ileocecal valve, and     appendiceal orifice were identified. The ascending, hepatic     flexure, transverse, splenic flexure, descending, sigmoid colon,     and rectum appeared unremarkable (see image001, image002, and     image003).  Internal and external hemorrhoids were found. These     were small.   Retroflexed views in the rectum revealed no     abnormalities.    The scope was then withdrawn from the patient     and the procedure completed.           COMPLICATIONS:  None           ENDOSCOPIC IMPRESSION:     1) Normal colon; no polyps or cancers     2) Internal  and external hemorrhoids           RECOMMENDATIONS:     1) Continue current colorectal screening recommendations for     "routine risk" patients with a repeat colonoscopy in 10 years.     2) Follow up with Dr. Sheryn Bison as needed.  Percocet can     contribute to constipation, try to take it as infrequently as you     can.           REPEAT EXAM:  10 years           ______________________________     Rachael Fee, MD           n.     eSIGNED:   Rachael Fee at 01/07/2010 12:01 PM  Teryl, Gubler, [id number]  Note: An exclamation mark (!) indicates a result that was not dispersed into the flowsheet. Document Creation Date: 01/07/2010 12:02 PM _______________________________________________________________________  (1) Order result status: Final Collection or observation date-time: 01/07/2010 11:48 Requested date-time:  Receipt date-time:  Reported date-time:  Referring Physician:   Ordering Physician: Rob Bunting [phone]) Specimen Source:  Source: Launa Grill Order Number: [id number] Lab site:

## 2010-08-05 NOTE — Assessment & Plan Note (Signed)
Summary: BACK & LEG PAIN,MC   Vital Signs:  Patient profile:   45 year old female Menstrual status:  irregular BP sitting:   115 / 76  Vitals Entered By: Lillia Pauls CMA (July 22, 2009 9:48 AM)  Primary Provider:  [name]  DO   History of Present Illness: Reports to f/u bilateral L5 pars defects with anterolisthesis. Has tried PT in the past with some success. No PT recently. Occasionally performs home back stretches such as knee-to-chest exercises. Occasionally LLE radiculopathy during other stretches. No bowel/bladder dysfunction. No saddle anesthesia. ROS unchanged since her LOV except for increased frequency of posterior LLE radiculopathy. Would like to see a chiropractor.  Notes "bumps" on arms, with any overlying area of ttp on the upper right arm. No fevers/sweats/chills. No respective swelling, discoloration, or increased warmth.  Allergies: 1)  ! Darvocet 2)  ! Neurontin 3)  ! Valium 4)  ! * Zofran 5)  ! Pcn 6)  ! Ultram 7)  ! Depakote PMH-FH-SH reviewed for relevance  Physical Exam  General:  Well-developed,well-nourished,in no acute distress; alert,appropriate and cooperative throughout examination Msk:  Full back flexion at the waist with dyscomfort across lower back at end flexion and on initial back extension fromt his position. Significantly limited extension with pain across the lower back w/right hip radiculopathy. Unable to extend back on one leg. Full lateral bending and rotation w/o dyscomfort. Mild ttp across the lower lumbar musculature. No spinal ttp. (+) sitting and supine SLR on the right. Equivocal supine SLR on the left. No gait instability or foot drop. Independently transfers to- and from bed with mild difficulty. Extremities:  Multiple, 1-2 mm, mobile, soft tissue masses on the proximal upper extremities consistent with lipomas with a  ~2 cm area of mild ttp on the lateral aspect of the right upper arm. No skin discoloration or  increased warmth.   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 Avoid positions which trigger your pain. - Avoid chiropractic intervention as your condition is a contraindication to chiropractic intervention. - Continue current medications. - RTC in 6 months or sooner prn. Will repeat x-rays in 6 months.  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  Complete Medication List: 1)  Nexium 40 Mg Cpdr (Esomeprazole magnesium) .... Take 1 tablet by mouth two times a day 2)  Zomig 5 Mg Tabs (Zolmitriptan) .... One tab by mouth as needed 3)  Percocet 5-325 Mg Tabs (Oxycodone-acetaminophen) .... Take 1 tablet by mouth every 6 hours as needed 4)  Alprazolam 2 Mg Tabs (Alprazolam) .... Take 1 tablet by mouth three times a day 5)  Tricor 145 Mg Tabs (Fenofibrate) .... Take 1 tabelt by mouth once daily 6)  Transderm-scop Pt72 (Scopolamine base pt72) .... Apply every 48 hours as needed 7)  Soma 250 Mg Tabs (Carisoprodol) .... One tab by mouth three times a day prn 8)  Gyne-lotrimin 1 % Crea (Clotrimazole) .... One application as directed. pharmacy may substitute equivalent 9)  Voltaren 1 % Gel (Diclofenac sodium) .... Use twice daily, as needed for pain. 10)  Terazol 7 0.4 % Crea (Terconazole) .... Use as directed 11)  Vaniqa 13.9 % Crea (Eflornithine hcl) .... Apply as directed 12)  Fluconazole 100 Mg Tabs (Fluconazole) .... Take one pill daily as needed for  yeast infection/thrush 13)  Topamax 50 Mg Tabs (Topiramate) .... Start one pill dailly for one week then increase to twice daily until your next visit 14)  Prezista 400 Mg Tabs (Darunavir ethanolate) .... Take two tablets once a day with norvir and viread 15)  Viread 300 Mg Tabs (Tenofovir disoproxil fumarate) ....  Take once a day with two prezistas and one norvir 16)  Combivir 150-300 Mg Tabs (Lamivudine-zidovudine) .... Take 1 tablet by mouth two times a day 17)  Norvir 100 Mg Tabs (Ritonavir) .... Take one tablet with the two prezistas and one viread 18)  Crestor 40 Mg Tabs (Rosuvastatin calcium) .... Take 1 tablet by mouth once a day (this replaces lipitor) 19)  Promethazine Hcl 50 Mg Tabs (Promethazine hcl) .... Take one half to one whole tablet up to four times daily as needed nausea

## 2010-08-05 NOTE — Miscellaneous (Signed)
  Clinical Lists Changes  Directives: Added new directive of PAIN CONTRACT VIOLATION 05/2010

## 2010-08-05 NOTE — Assessment & Plan Note (Signed)
Summary: 4 month f/u[mkj]   Referring Crit Obremski:  na Primary Henryk Ursin:  [name]  DO  CC:  f/u.  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 She has continued to have trouuble with nausea, and vomiting and occ vomits up nighttime meds. She continues to lose weight intentionally. She is having arm pain worked up by Dr. Darrick Penna.  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) 13)  Sinusitis, Recurrent  (ICD-473.9) 14)  Venous Insufficiency, Mild  (ICD-459.81) 15)  Myalgia  (ICD-729.1) 16)  Knee Pain, Bilateral  (ICD-719.46) 17)  Hirsutism  (ICD-704.1) 18)  Nausea and Vomiting  (ICD-787.01) 19)  Urinary Incontinence  (ICD-788.30) 20)  Hx of Dvt  (ICD-453.40) 21)  Obesity Nos  (ICD-278.00) 22)  Non-hodgkin's Lymphoma, Hx of  (ICD-V10.79) 23)  Peripheral Neuropathy  (ICD-356.9) 24)  Asthma  (ICD-493.90) 25)  Anxiety  (ICD-300.00) 26)  Hyperlipidemia  (ICD-272.4) 27)  Depression  (ICD-311) 28)  Genital Herpes, Hx of  (ICD-V13.8) 29)  Gastroparesis  (ICD-536.3) 30)  Gerd  (ICD-530.81) 31)  Irritable Bowel Syndrome  (ICD-564.1) 32)  HIV Infection  (ICD-042) 33)  Migraine Headache  (ICD-346.90) 34)  Hx of Esophagitis  (ICD-530.10)  Medications Prior to Update: 1)  Nexium 40 Mg Cpdr (Esomeprazole Magnesium) .... Take 1 Tablet By Mouth Two Times A Day 2)  Zomig 5 Mg Tabs (Zolmitriptan) .... Take 1  Tablet By Mouth Once A Day As Needed Migraine 3)  Percocet 5-325 Mg Tabs (Oxycodone-Acetaminophen) .... Take 1 Tablet By Mouth Every 6 Hours As Needed 4)  Alprazolam 2 Mg Tabs (Alprazolam) .... Take 1 Tablet By Mouth Three Times A Day 5)  Tricor 145 Mg  Tabs (Fenofibrate) .... Take 1 Tabelt By Mouth Once Daily 6)  Transderm-Scop  Pt72 (Scopolamine Base Pt72) .... Apply Every 48 Hours As Needed 7)  Soma 250 Mg Tabs (Carisoprodol) .... One Tab By Mouth Three Times A Day Prn 8)  Gyne-Lotrimin 1 %  Crea (Clotrimazole) .... One Application As Directed. Pharmacy May Substitute Equivalent 9)  Voltaren 1 %  Gel (Diclofenac Sodium) .... Use Twice Daily, As Needed For Pain. 10)  Terazol 7 0.4 % Crea (Terconazole) .... Use As Directed 11)  Vaniqa 13.9 % Crea (Eflornithine Hcl) .... Apply As Directed 12)  Fluconazole 100 Mg Tabs (Fluconazole) .... Take One Pill Daily As Needed For Yeast Infection/thrush 13)  Topamax 50 Mg Tabs (Topiramate) .... Start One Pill Dailly For One Week Then Increase To Twice Daily Until Your Next Visit 14)  Prezista 400 Mg Tabs (Darunavir Ethanolate) .... Take Two Tablets Once A Day With Norvir and Viread 15)  Viread 300 Mg Tabs (Tenofovir Disoproxil Fumarate) .... Take Once A Day With Two Prezistas and One Norvir 16)  Combivir 150-300 Mg Tabs (Lamivudine-Zidovudine) .... Take 1 Tablet By Mouth Two Times A Day 17)  Norvir 100 Mg Tabs (Ritonavir) .... Take One Tablet With The Two Prezistas and One Viread 18)  Crestor 40 Mg Tabs (Rosuvastatin Calcium) .... Take 1 Tablet By Mouth Once A Day (This Replaces Lipitor) 19)  Promethazine Hcl 50 Mg Tabs (Promethazine Hcl) .... Take One Half To One Whole Tablet Up To Four Times Daily As Needed Nausea 20)  Hibiclens 4 % Liqd (Chlorhexidine Gluconate) .... Use As Directed Two Times A Day 21)  Amitiza 8 Mcg Caps (Lubiprostone) .... Two Times A Day With Meals 22)  Kristalose 20 Gm  Pack (Lactulose) .... 20 Gm Q Hs 23)  Domeperidome 10 Mg .Marland Kitchen.. 1  By Mouth Three Times A Day  Current Medications (verified): 1)  Nexium 40 Mg Cpdr (Esomeprazole Magnesium) .... Take 1 Tablet By Mouth Two Times A Day 2)  Zomig 5 Mg Tabs (Zolmitriptan) .... Take 1 Tablet By Mouth Once A Day As Needed Migraine 3)  Percocet 5-325 Mg Tabs (Oxycodone-Acetaminophen) .... Take 1 Tablet By Mouth Every 6 Hours As Needed 4)  Alprazolam 2 Mg Tabs (Alprazolam) .... Take 1 Tablet By Mouth Three Times A Day 5)  Tricor 145 Mg  Tabs (Fenofibrate) .... Take 1 Tabelt By Mouth Once Daily 6)  Transderm-Scop  Pt72 (Scopolamine Base Pt72) .... Apply Every 48 Hours As Needed 7)  Soma 250 Mg Tabs (Carisoprodol) .... One Tab By Mouth Three Times A Day Prn 8)  Gyne-Lotrimin 1 %  Crea (Clotrimazole) .... One Application As Directed. Pharmacy May Substitute Equivalent 9)  Voltaren 1 %  Gel (Diclofenac Sodium) .... Use Twice Daily, As Needed For Pain. 10)  Terazol 7 0.4 % Crea (Terconazole) .... Use As Directed 11)  Vaniqa 13.9 % Crea (Eflornithine Hcl) .... Apply As Directed 12)  Fluconazole 100 Mg Tabs (Fluconazole) .... Take One Pill Daily As Needed For Yeast Infection/thrush 13)  Topamax 50 Mg Tabs (Topiramate) .... Start One Pill Dailly For One Week Then Increase To Twice Daily Until Your Next Visit 14)  Prezista 400 Mg Tabs (Darunavir Ethanolate) .... Take Two Tablets Once A Day With Norvir and Viread 15)  Viread 300 Mg Tabs (Tenofovir Disoproxil Fumarate) .... Take Once A Day With Two Prezistas and One Norvir 16)  Combivir 150-300 Mg Tabs (Lamivudine-Zidovudine) .... Take 1 Tablet By Mouth Two Times A Day 17)  Norvir 100 Mg Tabs (Ritonavir) .... Take One Tablet With The Two Prezistas and One Viread 18)  Crestor 40 Mg Tabs (Rosuvastatin Calcium) .... Take 1 Tablet By Mouth Once A Day (This Replaces Lipitor) 19)  Promethazine Hcl 50 Mg Tabs (Promethazine Hcl) .... Take One Half To One Whole Tablet Up To Four Times Daily As Needed Nausea 20)  Hibiclens 4 % Liqd (Chlorhexidine  Gluconate) .... Use As Directed Two Times A Day 21)  Amitiza 8 Mcg Caps (Lubiprostone) .... Two Times A Day With Meals 22)  Kristalose 20 Gm  Pack (Lactulose) .... 20 Gm Q Hs 23)  Domeperidome 10 Mg .Marland Kitchen.. 1 By Mouth Three Times A Day  Allergies: 1)  ! Darvocet 2)  ! Neurontin 3)  ! Valium 4)  ! * Zofran 5)  ! Pcn 6)  ! Ultram 7)  ! Depakote    Current Allergies (reviewed today): ! DARVOCET ! NEURONTIN ! VALIUM ! * ZOFRAN ! PCN ! ULTRAM ! DEPAKOTE Past History:  Past Medical History: Last updated: 04/29/2008 Hx Non-Hodgkins Lymphoma Stage IIA Recurrent Sinusitis History of Peptic ulcer disease-EGD 1/99-Dr. Corinda Gubler History of Esophagitis  Obesity Otitis media-w/ear tubes/hx reconstruction Migraine headaches HIV infection-on HAART  dx 1992 Plantar wart-foot Estrogen deficiency-on Premarin Constipation Anxiety Asthma  Depression Meningitis age 37 Tonsillectomy age 2  Post-herpetic Neuralgia L5-SI Spondylolithesis-pars defect Shingles, Lumbar Dermatome Diabetes mellitus, type II GERD Hyperlipidemia Hypertension Peripheral neuropathy Non-Hodgkin's lymphoma Gastroparesis IBS Restless leg syndrome  Past Surgical History: Last updated: 01/29/2008 Endoscopy [phone] per Dr. 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  Family History: Last updated: 12/08/2009 Mom - HTN, hx of TIA Dad - died at 30 yo of suicide-(GSW  when she was 15 yrs.) No FH of Colon Cancer:  Social History: Last updated: 03/24/2008 Single Lives alone No ETOH No Tobacco Abuse  Risk Factors: Alcohol Use: 0 (01/19/2010) Caffeine Use: coffee, tea and sodas (10/22/2009) Exercise: yes (10/22/2009)  Risk Factors: Smoking Status: never (01/19/2010)  Family History: Reviewed history from 12/08/2009 and no changes required. Mom - HTN, hx of TIA Dad - died at 46 yo of suicide-(GSW  when she was 15 yrs.) No FH of Colon Cancer:  Social History: Reviewed history from  03/24/2008 and no changes required. Single Lives alone No ETOH No Tobacco Abuse  Vital Signs:  Patient profile:   45 year old female Menstrual status:  irregular Height:      66 inches (167.64 cm) Weight:      218 pounds (99.09 kg) BMI:     35.31 Pulse rate:   71 / minute BP sitting:   125 / 83  (left arm)  Vitals Entered By: Starleen Arms CMA (February 03, 2010 9:42 AM) CC: f/u Is Patient Diabetic? No Pain Assessment Patient in pain? yes     Location: knee Intensity: 6 Type: aching Nutritional Status BMI of > 30 = obese Nutritional Status Detail nl  Does patient need assistance? Functional Status Self care Ambulation Normal   Physical Exam  General:  alert, well-nourished, well-hydrated, and overweight-appearing.   Head:  normocephalic and atraumatic.   Eyes:  vision grossly intact, pupils equal, and pupils round.   Ears:  no external deformities and ear piercing(s) noted.   Nose:  no external deformity, no external erythema, and no nasal discharge.   Mouth:  pharynx pink and moist, no erythema, and no exudates Lungs:  normal respiratory effort, no crackles, and no wheezes.   Heart:  normal rate, regular rhythm, no murmur, no gallop, and no rub.   Abdomen:  soft, non-tender, normal bowel sounds, and no distention.   Msk:  arm Extremities:  1+ left pedal edema and 1+ right pedal edema.   Neurologic:  alert & oriented X3 and strength normal in all extremities.   Skin:  turgor normal and no ecchymoses.   Psych:  Oriented X3, memory intact for recent and remote, normally interactive and in good spirits    Impression & Recommendations:  Problem # 1:  HIV INFECTION (ICD-042) Excellent control! Her updated medication list for this problem includes:    Fluconazole 100 Mg Tabs (Fluconazole) .Marland Kitchen... Take one pill daily as needed for yeast infection/thrush  Orders: Est. Patient Level IV (99214)Future Orders: T-CD4SP ([facility]) (CD4SP) ... 05/31/2010 T-HIV Viral  Load [phone]) ... 05/31/2010 T-CBC w/Diff ([id number]) ... 05/31/2010 T-Comprehensive Metabolic Panel [phone]) ... 05/31/2010  Problem # 2:  MACROCYTIC ANEMIA (ICD-281.9)  this certainly could be due to vitamin b12 def, and macrocytosis is common with AZT. She wants to get her shots here instead of [facility] and I am fine with that. One could also consider oral b12 replacement  Orders: Est. Patient Level IV (25366)  Problem # 3:  NAUSEA AND VOMITING (ICD-787.01)  rewrote phenergan for pt  Orders: Est. Patient Level IV (16109)  Problem # 4:  NON-HODGKIN'S LYMPHOMA, HX OF (ICD-V10.79) [facility]  Other Orders: Hepatitis B Vaccine >40yrs (60454) Admin 1st Vaccine (09811) Tdap => 5yrs IM (91478) Admin of Any Addtl Vaccine (29562)  Patient Instructions: 1)  rtc in 4 months 2)  Be sure to return for lab work one (2) week before your next appointment as scheduled.    Immunizations Administered:  Hepatitis B Vaccine # 1:    Vaccine Type: HepB Adult    Site: left deltoid    Mfr: Merck    Dose: 0.5 ml    Route: IM    Given by: Starleen Arms CMA    Exp. Date: 11/01/2011    Lot #: [id number]    VIS given: 01/18/06 version given February 03, 2010.  Tetanus Vaccine:    Vaccine Type: Tdap    Site: right deltoid    Mfr: boostrix    Dose: 0.5 ml    Route: IM    Given by: Starleen Arms CMA    Exp. Date: 09/26/2011    Lot #: [id number]    VIS given: 05/22/07 version given February 03, 2010. Prescriptions: NORVIR 100 MG TABS (RITONAVIR) take one tablet with the two prezistas and one viread  #31 x 11   Entered and Authorized by:   Acey Lav MD   Signed by:   Paulette Blanch Dam MD on 02/03/2010   Method used:   Electronically to        [facility] (retail)       [phone] [address]       [place], [place]  [address]       Ph: [phone]       Fax: [phone]   RxID:   [id number] COMBIVIR  150-300 MG TABS (LAMIVUDINE-ZIDOVUDINE) Take 1 tablet by mouth two times a day  #62 x 11   Entered and Authorized by:   Acey Lav MD   Signed by:   Paulette Blanch Dam MD on 02/03/2010   Method used:   Electronically to        [facility] (retail)       [phone] [address]       [place], [place]  [id number]       Ph: [phone]       Fax: [phone]   RxID:   [id number] VIREAD 300 MG TABS (TENOFOVIR DISOPROXIL FUMARATE) take once a day with two prezistas and one norvir  #31 x 11   Entered and Authorized by:   Acey Lav MD   Signed by:   Paulette Blanch Dam MD on 02/03/2010   Method used:   Electronically to        [facility] (retail)       [phone] [address]       [place], [place]  [id number]       Ph: [phone]       Fax: [phone]   RxID:   [id number] PREZISTA 400 MG TABS (DARUNAVIR ETHANOLATE) take two tablets once a day with norvir and viread  #62 x 11   Entered and Authorized by:   Acey Lav MD   Signed by:   Paulette Blanch Dam MD on 02/03/2010   Method used:  Electronically to        [facility] (retail)       [address] [address]       [place], [place]  [address]       Ph: [phone]       Fax: [phone]   RxID:   [id number] PROMETHAZINE HCL 50 MG TABS (PROMETHAZINE HCL) take one half to one whole tablet up to four times daily as needed nausea  #60 x 11   Entered and Authorized by:   Acey Lav MD   Signed by:   Paulette Blanch Dam MD on 02/03/2010   Method used:   Electronically to        [facility] (retail)       [phone] [address]       [place], [place]  [id number]       Ph: [phone]       Fax: [phone]   RxID:   [id number]          Medication Adherence: 02/03/2010   Adherence to medications reviewed with patient. Counseling to provide adequate adherence  provided    Prevention For Positives: 02/03/2010   Safe sex practices discussed with patient. Condoms offered.

## 2010-08-05 NOTE — Assessment & Plan Note (Signed)
Summary: ACUTE-3 MONTH F/U PER DR GOLDING([facility])   Vital Signs:  Patient profile:   45 year old female Menstrual status:  irregular Height:      66 inches (167.64 cm) Weight:      217.4 pounds (98.82 kg) BMI:     35.22 Temp:     98.4 degrees F (36.89 degrees C) oral Pulse rate:   78 / minute BP sitting:   120 / 74  (right arm) Cuff size:   regular  Vitals Entered By: Cynda Familia Duncan Dull) (April 09, 2010 10:59 AM) CC: med refill, Depression Is Patient Diabetic? No Nutritional Status BMI of > 30 = obese  Have you ever been in a relationship where you felt threatened, hurt or afraid?No   Does patient need assistance? Functional Status Self care Ambulation Normal   Primary Care Loyal Holzheimer:  [name]  DO  CC:  med refill and Depression.  History of Present Illness: 45 yr old woman with pmhx as described below comes to the clinic for refill of medication and reporting of getting sore throat, productive cough of whitish color, sneezing for the last 4 days.   Patient would like prescriptions for percocet for three months.   Reports to be constipation.   Depression History:      The patient notes a diminished interest in her usual daily activities.        The patient denies that she wishes that she were dead and denies that she has thought about ending her life.        Preventive Screening-Counseling & Management  Alcohol-Tobacco     Alcohol drinks/day: 0     Smoking Status: never  Problems Prior to Update: 1)  Macrocytic Anemia  (ICD-281.9) 2)  Lipodystrophy  (ICD-272.6) 3)  Abdominal Mass, Right Upper Quadrant  (ICD-789.31) 4)  Chronic Pain Syndrome  (ICD-338.4) 5)  Constipation  (ICD-564.00) 6)  Boils, Recurrent  (ICD-680.9) 7)  Dysuria  (ICD-788.1) 8)  Macrocytic Anemia  (ICD-281.9) 9)  Lipomas, Multiple  (ICD-214.9) 10)  Degenerative Disc Disease, Lumbosacral Spine  (ICD-722.52) 11)  Hyperlipidemia  (ICD-272.4) 12)  Acquired Spondylolisthesis   (ICD-738.4) 13)  Iliotibial Band Syndrome, Bilateral  (ICD-728.89) 14)  Sinusitis, Recurrent  (ICD-473.9) 15)  Venous Insufficiency, Mild  (ICD-459.81) 16)  Myalgia  (ICD-729.1) 17)  Knee Pain, Bilateral  (ICD-719.46) 18)  Hirsutism  (ICD-704.1) 19)  Nausea and Vomiting  (ICD-787.01) 20)  Urinary Incontinence  (ICD-788.30) 21)  Hx of Dvt  (ICD-453.40) 22)  Obesity Nos  (ICD-278.00) 23)  Non-hodgkin's Lymphoma, Hx of  (ICD-V10.79) 24)  Peripheral Neuropathy  (ICD-356.9) 25)  Asthma  (ICD-493.90) 26)  Anxiety  (ICD-300.00) 27)  Hyperlipidemia  (ICD-272.4) 28)  Depression  (ICD-311) 29)  Genital Herpes, Hx of  (ICD-V13.8) 30)  Gastroparesis  (ICD-536.3) 31)  Gerd  (ICD-530.81) 32)  Irritable Bowel Syndrome  (ICD-564.1) 33)  HIV Infection  (ICD-042) 34)  Migraine Headache  (ICD-346.90) 35)  Hx of Esophagitis  (ICD-530.10)  Medications Prior to Update: 1)  Nexium 40 Mg Cpdr (Esomeprazole Magnesium) .... Take 1 Tablet By Mouth Two Times A Day 2)  Zomig 5 Mg Tabs (Zolmitriptan) .... Take 1 Tablet By Mouth Once A Day As Needed Migraine 3)  Percocet 5-325 Mg Tabs (Oxycodone-Acetaminophen) .... Take 1 Tablet By Mouth Every 6 Hours As Needed 4)  Alprazolam 2 Mg Tabs (Alprazolam) .... Take 1 Tablet By Mouth Three Times A Day 5)  Tricor 145 Mg  Tabs (Fenofibrate) .... Take 1 Tabelt By  Mouth Once Daily 6)  Transderm-Scop  Pt72 (Scopolamine Base Pt72) .... Apply Every 48 Hours As Needed 7)  Soma 250 Mg Tabs (Carisoprodol) .... One Tab By Mouth Three Times A Day Prn 8)  Gyne-Lotrimin 1 %  Crea (Clotrimazole) .... One Application As Directed. Pharmacy May Substitute Equivalent 9)  Voltaren 1 %  Gel (Diclofenac Sodium) .... Use Twice Daily, As Needed For Pain. 10)  Terazol 7 0.4 % Crea (Terconazole) .... Use As Directed 11)  Vaniqa 13.9 % Crea (Eflornithine Hcl) .... Apply As Directed 12)  Fluconazole 100 Mg Tabs (Fluconazole) .... Take One Pill Daily As Needed For Yeast Infection/thrush 13)   Topamax 50 Mg Tabs (Topiramate) .... Start One Pill Dailly For One Week Then Increase To Twice Daily Until Your Next Visit 14)  Prezista 400 Mg Tabs (Darunavir Ethanolate) .... Take Two Tablets Once A Day With Norvir and Viread 15)  Viread 300 Mg Tabs (Tenofovir Disoproxil Fumarate) .... Take Once A Day With Two Prezistas and One Norvir 16)  Combivir 150-300 Mg Tabs (Lamivudine-Zidovudine) .... Take 1 Tablet By Mouth Two Times A Day 17)  Norvir 100 Mg Tabs (Ritonavir) .... Take One Tablet With The Two Prezistas and One Viread 18)  Crestor 40 Mg Tabs (Rosuvastatin Calcium) .... Take 1 Tablet By Mouth Once A Day (This Replaces Lipitor) 19)  Promethazine Hcl 50 Mg Tabs (Promethazine Hcl) .... Take One Half To One Whole Tablet Up To Four Times Daily As Needed Nausea 20)  Hibiclens 4 % Liqd (Chlorhexidine Gluconate) .... Use As Directed Two Times A Day 21)  Amitiza 8 Mcg Caps (Lubiprostone) .... Two Times A Day With Meals 22)  Kristalose 20 Gm  Pack (Lactulose) .... 20 Gm Q Hs 23)  Domeperidome 10 Mg .Marland Kitchen.. 1 By Mouth Three Times A Day 24)  Meloxicam 15 Mg Tabs (Meloxicam) .Marland Kitchen.. 1 Tab By Mouth Daily With Food X 14 Days, Then As Needed For Pain 25)  Avelox 400 Mg Tabs (Moxifloxacin Hcl) .Marland Kitchen.. 1 Daily For 10 Days 26)  Cyanocobalamin 1000 Mcg/ml Soln (Cyanocobalamin) .... Administer 1.0 Ml Intramuscularly Monthly  Current Medications (verified): 1)  Nexium 40 Mg Cpdr (Esomeprazole Magnesium) .... Take 1 Tablet By Mouth Two Times A Day 2)  Zomig 5 Mg Tabs (Zolmitriptan) .... Take 1 Tablet By Mouth Once A Day As Needed Migraine 3)  Percocet 5-325 Mg Tabs (Oxycodone-Acetaminophen) .... Take 1 Tablet By Mouth Every 6 Hours As Needed 4)  Alprazolam 2 Mg Tabs (Alprazolam) .... Take 1 Tablet By Mouth Three Times A Day 5)  Tricor 145 Mg  Tabs (Fenofibrate) .... Take 1 Tabelt By Mouth Once Daily 6)  Transderm-Scop  Pt72 (Scopolamine Base Pt72) .... Apply Every 48 Hours As Needed 7)  Soma 250 Mg Tabs (Carisoprodol)  .... One Tab By Mouth Three Times A Day Prn 8)  Gyne-Lotrimin 1 %  Crea (Clotrimazole) .... One Application As Directed. Pharmacy May Substitute Equivalent 9)  Voltaren 1 %  Gel (Diclofenac Sodium) .... Use Twice Daily, As Needed For Pain. 10)  Terazol 7 0.4 % Crea (Terconazole) .... Use As Directed 11)  Vaniqa 13.9 % Crea (Eflornithine Hcl) .... Apply As Directed 12)  Fluconazole 100 Mg Tabs (Fluconazole) .... Take One Pill Daily As Needed For Yeast Infection/thrush 13)  Topamax 50 Mg Tabs (Topiramate) .... Start One Pill Dailly For One Week Then Increase To Twice Daily Until Your Next Visit 14)  Prezista 400 Mg Tabs (Darunavir Ethanolate) .... Take Two Tablets Once A Day With  Norvir and Viread 15)  Viread 300 Mg Tabs (Tenofovir Disoproxil Fumarate) .... Take Once A Day With Two Prezistas and One Norvir 16)  Combivir 150-300 Mg Tabs (Lamivudine-Zidovudine) .... Take 1 Tablet By Mouth Two Times A Day 17)  Norvir 100 Mg Tabs (Ritonavir) .... Take One Tablet With The Two Prezistas and One Viread 18)  Crestor 40 Mg Tabs (Rosuvastatin Calcium) .... Take 1 Tablet By Mouth Once A Day (This Replaces Lipitor) 19)  Promethazine Hcl 50 Mg Tabs (Promethazine Hcl) .... Take One Half To One Whole Tablet Up To Four Times Daily As Needed Nausea 20)  Hibiclens 4 % Liqd (Chlorhexidine Gluconate) .... Use As Directed Two Times A Day 21)  Amitiza 8 Mcg Caps (Lubiprostone) .... Two Times A Day With Meals 22)  Kristalose 20 Gm  Pack (Lactulose) .... 20 Gm Q Hs 23)  Domeperidome 10 Mg .Marland Kitchen.. 1 By Mouth Three Times A Day 24)  Meloxicam 15 Mg Tabs (Meloxicam) .Marland Kitchen.. 1 Tab By Mouth Daily With Food X 14 Days, Then As Needed For Pain 25)  Cyanocobalamin 1000 Mcg/ml Soln (Cyanocobalamin) .... Administer 1.0 Ml Intramuscularly Monthly  Allergies: 1)  ! Darvocet 2)  ! Neurontin 3)  ! Valium 4)  ! * Zofran 5)  ! Pcn 6)  ! Ultram 7)  ! Depakote  Past History:  Past Medical History: Last updated: 04/29/2008 Hx  Non-Hodgkins Lymphoma Stage IIA Recurrent Sinusitis History of Peptic ulcer disease-EGD 1/99-Dr. 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  Family History: Last updated: 12/08/2009 Mom - HTN, hx of TIA Dad - died at 24 yo of suicide-(GSW  when she was 15 yrs.) No FH of Colon Cancer:  Social History: Last updated: 03/24/2008 Single Lives alone No ETOH No Tobacco Abuse  Risk Factors: Alcohol Use: 0 (04/09/2010) Caffeine Use: coffee, tea and sodas (10/22/2009) Exercise: yes (10/22/2009)  Risk Factors: Smoking Status: never (04/09/2010)  Family History: Reviewed history from 12/08/2009 and no changes required. Mom - HTN, hx of TIA Dad - died at 78 yo of suicide-(GSW  when she was 15 yrs.) No FH of Colon Cancer:  Social History: Reviewed history from 03/24/2008 and no changes required. Single Lives alone No ETOH No Tobacco Abuse  Review of Systems  The patient denies fever, chest pain, dyspnea on exertion, peripheral edema, hemoptysis, melena, hematochezia, hematuria, and muscle weakness.    Physical Exam  General:  Well-developed,well-nourished,in no acute distress; alert,appropriate and cooperative throughout examination Mouth:  pharyngeal erythema, no exudates Lungs:  normal respiratory effort, no accessory muscle use, normal breath sounds, no crackles, and no wheezes.    Heart:  normal rate, regular rhythm, no murmur, no gallop, and no rub.   Abdomen:  soft and normal bowel sounds.   Extremities:  no edema, symetric, no swollen or painful joints. Neurologic:  alert & oriented X3, cranial nerves II-XII intact, and strength normal in all extremities.     Impression & Recommendations:  Problem # 1:  CHRONIC PAIN SYNDROME (ICD-338.4) Patient signed pain contract today. Will have PCP refill pain meds for future months as  she has enough medication for this month.  Problem # 2:  VIRAL URI (ICD-465.9) Symptomatic treatment. No indication for antibiotics. Instructed to buy antitussive/decongestant. To return to the clinic if symptoms worsen.  Her updated medication list for this problem includes:    Promethazine Hcl 50 Mg Tabs (Promethazine hcl) .Marland Kitchen... Take one half to one whole tablet up to four times daily as needed nausea    Meloxicam 15 Mg Tabs (Meloxicam) .Marland Kitchen... 1 tab by mouth daily with food x 14 days, then as needed for pain  Problem # 3:  CONSTIPATION (ICD-564.00) Started patient on senokot. Reassess on follow up.  Her updated medication list for this problem includes:    Kristalose 20 Gm Pack (Lactulose) .Marland Kitchen... 20 gm q hs    Senokot Xtra 17.2 Mg Tabs (Sennosides) .Marland Kitchen... Take 1-2 tablet by mouth as needed for constipation  Problem # 4:  ASTHMA (ICD-493.90) Stable. No wheezing on exam.  Complete Medication List: 1)  Nexium 40 Mg Cpdr (Esomeprazole magnesium) .... Take 1 tablet by mouth two times a day 2)  Zomig 5 Mg Tabs (Zolmitriptan) .... Take 1 tablet by mouth once a day as needed migraine 3)  Percocet 5-325 Mg Tabs (Oxycodone-acetaminophen) .... Take 1 tablet by mouth every 6 hours as needed 4)  Alprazolam 2 Mg Tabs (Alprazolam) .... Take 1 tablet by mouth three times a day 5)  Tricor 145 Mg Tabs (Fenofibrate) .... Take 1 tabelt by mouth once daily 6)  Transderm-scop Pt72 (Scopolamine base pt72) .... Apply every 48 hours as needed 7)  Soma 250 Mg  Tabs (Carisoprodol) .... One tab by mouth three times a day prn 8)  Gyne-lotrimin 1 % Crea (Clotrimazole) .... One application as directed. pharmacy may substitute equivalent 9)  Voltaren 1 % Gel (Diclofenac sodium) .... Use twice daily, as needed for pain. 10)  Terazol 7 0.4 % Crea (Terconazole) .... Use as directed 11)  Vaniqa 13.9 % Crea (Eflornithine hcl) .... Apply as directed 12)  Fluconazole 100 Mg Tabs (Fluconazole) .... Take one pill daily as needed for yeast infection/thrush 13)  Topamax 50 Mg Tabs (Topiramate) .... Start one pill dailly for one week then increase to twice daily until your next visit 14)  Prezista 400 Mg Tabs (Darunavir ethanolate) .... Take two tablets once a day with norvir and viread 15)  Viread 300 Mg Tabs (Tenofovir disoproxil fumarate) .... Take once a day with two prezistas and one norvir 16)  Combivir 150-300 Mg Tabs (Lamivudine-zidovudine) .... Take 1 tablet by mouth two times a day 17)  Norvir 100 Mg Tabs (Ritonavir) .... Take one tablet with the two prezistas and one viread 18)  Crestor 40 Mg Tabs (Rosuvastatin calcium) .... Take 1 tablet by mouth once a day (this replaces lipitor) 19)  Promethazine Hcl 50 Mg Tabs (Promethazine hcl) .... Take one half to one whole tablet up to four times daily as needed nausea 20)  Hibiclens 4 % Liqd (Chlorhexidine gluconate) .... Use as directed two times a day 21)  Amitiza 8 Mcg Caps (Lubiprostone) .... Two times a day with meals 22)  Kristalose 20 Gm Pack (Lactulose) .... 20 gm q hs 23)  Domeperidome 10 Mg  .Marland KitchenMarland Kitchen. 1 by mouth three times a day 24)  Meloxicam 15 Mg Tabs (Meloxicam) .Marland Kitchen.. 1 tab by mouth daily with food x 14 days, then as needed for pain 25)  Cyanocobalamin 1000 Mcg/ml Soln (Cyanocobalamin) .... Administer 1.0 ml intramuscularly monthly 26)  Senokot Xtra 17.2 Mg Tabs (Sennosides) .Marland KitchenMarland KitchenMarland Kitchen  Take 1-2 tablet by mouth as needed for constipation   Patient Instructions: 1)  Follow up with PCP in alreaday schedule  appointment in January. 2)  Take all medicaiton as directed.  Prescriptions: SENOKOT XTRA 17.2 MG TABS (SENNOSIDES) Take 1-2 tablet by mouth as needed for constipation  #20 x 1   Entered and Authorized by:   Laren Everts MD   Signed by:   Laren Everts MD on 04/09/2010   Method used:   Electronically to        [facility] (retail)       [address]       [place], [place]  [id number]       Ph: [phone]       Fax: [phone]   RxID:   [id number]

## 2010-08-05 NOTE — Consult Note (Signed)
Summary: [facility]  [facility]   Imported By: Florinda Marker 07/12/2010 12:04:08  _____________________________________________________________________  External Attachment:    Type:   Image     Comment:   External Document

## 2010-08-05 NOTE — Assessment & Plan Note (Signed)
Summary: B-12/VS   Vital Signs:  Patient profile:   45 year old female Menstrual status:  irregular Weight:      219 pounds (99.55 kg) BMI:     35.48 Temp:     98.7 degrees F (37.06 degrees C) oral Pulse rate:   73 / minute BP sitting:   119 / 87  (left arm)  Vitals Entered By: Starleen Arms CMA (February 19, 2010 9:24 AM) CC: patient here for b12 injection, c/o sinus pain and pressure, sore throat, cough Is Patient Diabetic? Yes  Spoke with Dr. Daiva Eves about patient's sxs he ok'ed rx for Avelox 400 mg to be called in at patient's  pharmacy. Starleen Arms CMA  February 19, 2010 9:52 AM   Prior Medications: NEXIUM 40 MG CPDR (ESOMEPRAZOLE MAGNESIUM) Take 1 tablet by mouth two times a day ZOMIG 5 MG TABS (ZOLMITRIPTAN) Take 1 tablet by mouth once a day as needed migraine PERCOCET 5-325 MG TABS (OXYCODONE-ACETAMINOPHEN) Take 1 tablet by mouth every 6 hours as needed ALPRAZOLAM 2 MG TABS (ALPRAZOLAM) Take 1 tablet by mouth three times a day TRICOR 145 MG  TABS (FENOFIBRATE) take 1 tabelt by mouth once daily TRANSDERM-SCOP  PT72 (SCOPOLAMINE BASE PT72) Apply every 48 hours as needed SOMA 250 MG TABS (CARISOPRODOL) one tab by mouth three times a day prn GYNE-LOTRIMIN 1 %  CREA (CLOTRIMAZOLE) One application as directed. Pharmacy may substitute equivalent VOLTAREN 1 %  GEL (DICLOFENAC SODIUM) Use twice daily, as needed for pain. TERAZOL 7 0.4 % CREA (TERCONAZOLE) Use as directed VANIQA 13.9 % CREA (EFLORNITHINE HCL) Apply as directed FLUCONAZOLE 100 MG TABS (FLUCONAZOLE) Take one pill daily as needed for yeast infection/thrush TOPAMAX 50 MG TABS (TOPIRAMATE) Start one pill dailly for one week then increase to twice daily until your next visit PREZISTA 400 MG TABS (DARUNAVIR ETHANOLATE) take two tablets once a day with norvir and viread VIREAD 300 MG TABS (TENOFOVIR DISOPROXIL FUMARATE) take once a day with two prezistas and one norvir COMBIVIR 150-300 MG TABS (LAMIVUDINE-ZIDOVUDINE)  Take 1 tablet by mouth two times a day NORVIR 100 MG TABS (RITONAVIR) take one tablet with the two prezistas and one viread CRESTOR 40 MG TABS (ROSUVASTATIN CALCIUM) Take 1 tablet by mouth once a day (this replaces lipitor) PROMETHAZINE HCL 50 MG TABS (PROMETHAZINE HCL) take one half to one whole tablet up to four times daily as needed nausea HIBICLENS 4 % LIQD (CHLORHEXIDINE GLUCONATE) Use as directed two times a day AMITIZA 8 MCG CAPS (LUBIPROSTONE) two times a day with meals KRISTALOSE 20 GM  PACK (LACTULOSE) 20 gm q hs DOMEPERIDOME 10 MG () 1 by mouth three times a day MELOXICAM 15 MG TABS (MELOXICAM) 1 tab by mouth daily with food x 14 days, then as needed for pain Current Allergies: ! DARVOCET ! NEURONTIN ! VALIUM ! * ZOFRAN ! PCN ! ULTRAM ! DEPAKOTE Medication Administration  Injection # 1:    Medication: Vit B12 1000 mcg    Diagnosis: HIV INFECTION (ICD-042)    Route: IM    Site: R deltoid    Exp Date: 06/2011    Lot #: [id number]    Mfr: [employer] Regent    Patient tolerated injection without complications    Given by: Starleen Arms CMA (February 19, 2010 9:28 AM)  Orders Added: 1)  Admin of Therapeutic Inj  intramuscular or subcutaneous [[id number]]   Medication Administration  Injection # 1:    Medication: Vit B12 1000 mcg    Diagnosis:  HIV INFECTION (ICD-042)    Route: IM    Site: R deltoid    Exp Date: 06/2011    Lot #: [id number]    Mfr: [employer] Regent    Patient tolerated injection without complications    Given by: Starleen Arms CMA (February 19, 2010 9:28 AM)  Orders Added: 1)  Admin of Therapeutic Inj  intramuscular or subcutaneous [[id number]]

## 2010-08-05 NOTE — Progress Notes (Signed)
Summary: Refill/gh  Phone Note Refill Request Message from:  Fax from Pharmacy on June 30, 2010 11:14 AM  Refills Requested: Medication #1:  ALPRAZOLAM 2 MG TABS Take 1 tablet by mouth three times a day   Last Refilled: 06/03/2010  Method Requested: Electronic Initial call taken by: Angelina Ok RN,  June 30, 2010 11:14 AM  Follow-up for Phone Call        Please ask patient when she last took this medication. I then need her to come in and leave a urine sample either today or tomorrow. She had no alprazolam in her urine on last R-UDS, and no percocet  but had a small amount of Ativan-which I have never prescribed. She also did not have any opiates in her urine that I have been prescribing-but has continued to get old scripts filled each month. She will need to be seen by me to discuss this unfortunate situation. For now I will not approve this medication. Follow-up by: [name]  DO,  June 30, 2010 11:30 AM  Additional Follow-up for Phone Call Additional follow up Details #1::        Pt was called and informed of the denial.  Pt has an appointment on 07/15/2010.  Pt said she will talk with Dr. Phillips Odor about the medication.  Additional Follow-up by: Angelina Ok RN,  July 01, 2010 3:35 PM

## 2010-08-05 NOTE — Procedures (Signed)
Summary: Instructions for procedure/[place]  Instructions for procedure/[facility]   Imported By: Sherian Rein 12/10/2009 14:20:29  _____________________________________________________________________  External Attachment:    Type:   Image     Comment:   External Document

## 2010-08-05 NOTE — Consult Note (Signed)
[facility]   Imported By: Florinda Marker 07/20/2009 14:32:07  _____________________________________________________________________  External Attachment:    Type:   Image     Comment:   External Document

## 2010-08-05 NOTE — Assessment & Plan Note (Signed)
Summary: EST-CK/FU/MEDS/CFB   Vital Signs:  Patient profile:   45 year old female Menstrual status:  irregular Height:      66 inches (167.64 cm) Weight:      225.04 pounds (102.29 kg) BMI:     36.45 Temp:     98.8 degrees F (37.11 degrees C) oral Pulse rate:   84 / minute BP sitting:   113 / 77  (right arm)  Vitals Entered By: Angelina Ok RN (September 16, 2009 1:34 PM) CC: Depression Is Patient Diabetic? No Pain Assessment Patient in pain? yes     Location: back, legs Intensity: 10 Type: aching Onset of pain  Chronic Nutritional Status BMI of > 30 = obese  Have you ever been in a relationship where you felt threatened, hurt or afraid?No   Does patient need assistance? Functional Status Self care Ambulation Normal Comments Needs refills.  Wants a Zomig back.  Migraines worsens. Wants a HbA1C to check her sugars.   Primary Care Provider:  [name]  DO  CC:  Depression.  History of Present Illness: [name] comes in toiday for multiple problems. She has worsening pain and fatigue and has started having "hot flashes". She reports that she has several episodes a day of feeling lightheaded, weak and has facial flusing and sweating. She has irregular periods and spotting for many years, no hx of oophorectomy or hysterectom. 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 Algis Liming to optimize her HIV medications. She has also been getting PT and going to sports med for her low back and knee pain.  Depression History:      The patient denies a depressed mood most of the day and a diminished interest in her usual daily activities.        The patient denies that she feels like life is not worth living, denies that she wishes that she were dead, and denies that she has thought about ending her life.        Comments:  Pain and headaches.  Family problems.   Preventive Screening-Counseling &  Management  Alcohol-Tobacco     Alcohol drinks/day: 0     Smoking Status: never  Current Medications (verified): 1)  Nexium 40 Mg Cpdr (Esomeprazole Magnesium) .... Take 1 Tablet By Mouth Two Times A Day 2)  Maxalt-Mlt 10 Mg Tbdp (Rizatriptan Benzoate) .... Take One Tab By Mouth Daily As Needed For Migraine 3)  Percocet 5-325 Mg Tabs (Oxycodone-Acetaminophen) .... Take 1 Tablet By Mouth Every 6 Hours As Needed 4)  Alprazolam 2 Mg Tabs (Alprazolam) .... Take 1 Tablet By Mouth Three Times A Day 5)  Tricor 145 Mg  Tabs (Fenofibrate) .... Take 1 Tabelt By Mouth Once Daily 6)  Transderm-Scop  Pt72 (Scopolamine Base Pt72) .... Apply Every 48 Hours As Needed 7)  Soma 250 Mg Tabs (Carisoprodol) .... One Tab By Mouth Three Times A Day Prn 8)  Gyne-Lotrimin 1 %  Crea (Clotrimazole) .... One Application As Directed. Pharmacy May Substitute Equivalent 9)  Voltaren 1 %  Gel (Diclofenac Sodium) .... Use Twice Daily, As Needed For Pain. 10)  Terazol 7 0.4 % Crea (Terconazole) .... Use As Directed 11)  Vaniqa 13.9 % Crea (Eflornithine Hcl) .... Apply As Directed 12)  Fluconazole 100 Mg Tabs (Fluconazole) .... Take One Pill Daily As Needed For Yeast Infection/thrush 13)  Topamax 50 Mg Tabs (Topiramate) .... Start One Pill Dailly For One  Week Then Increase To Twice Daily Until Your Next Visit 14)  Prezista 400 Mg Tabs (Darunavir Ethanolate) .... Take Two Tablets Once A Day With Norvir and Viread 15)  Viread 300 Mg Tabs (Tenofovir Disoproxil Fumarate) .... Take Once A Day With Two Prezistas and One Norvir 16)  Combivir 150-300 Mg Tabs (Lamivudine-Zidovudine) .... Take 1 Tablet By Mouth Two Times A Day 17)  Norvir 100 Mg Tabs (Ritonavir) .... Take One Tablet With The Two Prezistas and One Viread 18)  Crestor 40 Mg Tabs (Rosuvastatin Calcium) .... Take 1 Tablet By Mouth Once A Day (This Replaces Lipitor) 19)  Promethazine Hcl 50 Mg Tabs (Promethazine Hcl) .... Take One Half To One Whole Tablet Up To Four Times  Daily As Needed Nausea  Allergies (verified): 1)  ! Darvocet 2)  ! Neurontin 3)  ! Valium 4)  ! * Zofran 5)  ! Pcn 6)  ! Ultram 7)  ! Depakote  Physical Exam  General:  Well-developed,well-nourished,in no acute distress; alert,appropriate and cooperative throughout examination Lungs:  normal respiratory effort, no accessory muscle use, normal breath sounds, no crackles, and no wheezes.   Heart:  normal rate, regular rhythm, no murmur, no gallop, and no rub.   Abdomen:  soft, non-tender, and normal bowel sounds.   Msk:  F Skin:  No rashes     Impression & Recommendations:  Problem # 1:  HOT FLASHES (ICD-627.2) Limited in choices of medicatuions. With her hx of DVT HRT is not an option. Her BP is borderline low so we will avoid clonidine additionally she is taking Topamax so adding Effexor would probably be risky. Will continue to monitor her symptoms.  Orders: T-CBC w/Diff ([id number]) T-Hgb A1C (in-house) ([id number])  Problem # 2:  HYPERLIPIDEMIA (ICD-272.4) Will recheck labs fasting. Prior labs were not fasting state so given her TGs we will need to repeat. She is associating her muscle pains to the statin.   Her updated medication list for this problem includes:    Tricor 145 Mg Tabs (Fenofibrate) .Marland Kitchen... Take 1 tabelt by mouth once daily    Crestor 40 Mg Tabs (Rosuvastatin calcium) .Marland Kitchen... Take 1 tablet by mouth once a day (this replaces lipitor)  Future Orders: T-Lipid Profile ([id number]) ... 09/23/2009  Problem # 3:  DEGENERATIVE DISC DISEASE, LUMBOSACRAL SPINE (ICD-722.52) Will continue her current pain medication regimen. I have given her 3 months of Percocet Prescriptions- Start date is 09/16/09, 10/17/09, and 11/16/2009. Discussed pain management expectatons.  Problem # 4:  DEPRESSION (ICD-311) Likely contributing to worsening of her pain and agitation. Will continue current dose of bzd. Also 3 months.  ed medication list for this problem includes:    Alprazolam 2  Mg Tabs (Alprazolam) .Marland Kitchen... Take 1 tablet by mouth three times a day  Complete Medication List: 1)  Nexium 40 Mg Cpdr (Esomeprazole magnesium) .... Take 1 tablet by mouth two times a day 2)  Maxalt-mlt 10 Mg Tbdp (Rizatriptan benzoate) .... Take one tab by mouth daily as needed for migraine 3)  Percocet 5-325 Mg Tabs (Oxycodone-acetaminophen) .... Take 1 tablet by mouth every 6 hours as needed 4)  Alprazolam 2 Mg Tabs (Alprazolam) .... Take 1 tablet by mouth three times a day 5)  Tricor 145 Mg Tabs (Fenofibrate) .... Take 1 tabelt by mouth once daily 6)  Transderm-scop Pt72 (Scopolamine base pt72) .... Apply every 48 hours as needed 7)  Soma 250 Mg Tabs (Carisoprodol) .... One tab by mouth three times a day prn 8)  Gyne-lotrimin  1 % Crea (Clotrimazole) .... One application as directed. pharmacy may substitute equivalent 9)  Voltaren 1 % Gel (Diclofenac sodium) .... Use twice daily, as needed for pain. 10)  Terazol 7 0.4 % Crea (Terconazole) .... Use as directed 11)  Vaniqa 13.9 % Crea (Eflornithine hcl) .... Apply as directed 12)  Fluconazole 100 Mg Tabs (Fluconazole) .... Take one pill daily as needed for yeast infection/thrush 13)  Topamax 50 Mg Tabs (Topiramate) .... Start one pill dailly for one week then increase to twice daily until your next visit 14)  Prezista 400 Mg Tabs (Darunavir ethanolate) .... Take two tablets once a day with norvir and viread 15)  Viread 300 Mg Tabs (Tenofovir disoproxil fumarate) .... Take once a day with two prezistas and one norvir 16)  Combivir 150-300 Mg Tabs (Lamivudine-zidovudine) .... Take 1 tablet by mouth two times a day 17)  Norvir 100 Mg Tabs (Ritonavir) .... Take one tablet with the two prezistas and one viread 18)  Crestor 40 Mg Tabs (Rosuvastatin calcium) .... Take 1 tablet by mouth once a day (this replaces lipitor) 19)  Promethazine Hcl 50 Mg Tabs (Promethazine hcl) .... Take one half to one whole tablet up to four times daily as needed  nausea  Patient Instructions: 1)  F/U in 3 months 2)  Lab appointment ASAP must be fasting Prescriptions: PERCOCET 5-325 MG TABS (OXYCODONE-ACETAMINOPHEN) Take 1 tablet by mouth every 6 hours as needed  #120 x 0   Entered and Authorized by:   [name]  DO   Signed by:   [name]  DO on 09/16/2009   Method used:   Reprint   RxID:   [id number] PERCOCET 5-325 MG TABS (OXYCODONE-ACETAMINOPHEN) Take 1 tablet by mouth every 6 hours as needed  #120 x 0   Entered and Authorized by:   [name]  DO   Signed by:   [name]  DO on 09/16/2009   Method used:   Print then Give to Patient   RxID:   [id number] PERCOCET 5-325 MG TABS (OXYCODONE-ACETAMINOPHEN) Take 1 tablet by mouth every 6 hours as needed  #120 x 0   Entered and Authorized by:   [name]  DO   Signed by:   [name]  DO on 09/16/2009   Method used:   Print then Give to Patient   RxID:   [id number] PROMETHAZINE HCL 50 MG TABS (PROMETHAZINE HCL) take one half to one whole tablet up to four times daily as needed nausea  #60 x 3   Entered and Authorized by:   [name]  DO   Signed by:   [name]  DO on 09/16/2009   Method used:   Print then Give to Patient   RxID:   [id number] TOPAMAX 50 MG TABS (TOPIRAMATE) Start one pill dailly for one week then increase to twice daily until your next visit  #60 x 3   Entered and Authorized by:   [name]  DO   Signed by:   [name]  DO on 09/16/2009   Method used:   Electronically to        [facility] (retail)       [address]       [place], [place]  [id number]       Ph: [phone]       Fax: [phone]   RxID:   [id number] FLUCONAZOLE 100 MG TABS (FLUCONAZOLE) Take  one pill daily as needed for yeast infection/thrush  #30 x 3   Entered and Authorized by:   [name]  DO   Signed by:   [name]  DO on 09/16/2009   Method used:   Electronically to        [facility] (retail)       [address]       [place], [place]  [id number]       Ph: [phone]       Fax: [phone]   RxID:   [id number] TERAZOL 7 0.4 % CREA (TERCONAZOLE) Use as directed  #1 tube x 11   Entered and Authorized by:   [name]  DO   Signed by:   [name]  DO on 09/16/2009   Method used:   Electronically to        [facility] (retail)       [phone] [address]       [place], [place]  [id number]       Ph: [phone]       Fax: [phone]   RxID:   [id number] MAXALT-MLT 10 MG TBDP (RIZATRIPTAN BENZOATE) Take one tab by mouth daily as needed for migraine  #10 x 1   Entered and Authorized by:   [name]  DO   Signed by:   [name]  DO on 09/16/2009   Method used:   Electronically to        [facility] (retail)       [address]       [place], [place]  [id number]       Ph: [phone]       Fax: [phone]   RxID:   [id number] VOLTAREN 1 %  GEL (DICLOFENAC SODIUM) Use twice daily, as needed for pain.  #1 x 6   Entered and Authorized by:   [name]  DO   Signed by:   [name]  DO on 09/16/2009   Method used:   Print then Give to Patient   RxID:   [id number] SOMA 250 MG TABS (CARISOPRODOL) one tab by mouth three times a day prn  #90 x 3   Entered and Authorized by:   [name]  DO   Signed by:   [name]  DO on 09/16/2009   Method used:   Print then Give to Patient   RxID:   [id number] ALPRAZOLAM 2 MG TABS (ALPRAZOLAM) Take 1 tablet by mouth three times a day  #90 x 3   Entered and Authorized by:   [name]  DO   Signed by:   [name]  DO on 09/16/2009   Method used:   Print then Give to Patient   RxID:   [id number] PERCOCET 5-325 MG TABS (OXYCODONE-ACETAMINOPHEN) Take 1 tablet by mouth every 6 hours as needed  #120 x 0   Entered and Authorized by:   [name]  DO   Signed by:   [name]   DO on 09/16/2009   Method used:   Print then Give to Patient   RxID:   [id number]   Prevention & Chronic Care Immunizations   Influenza vaccine: Fluvax 3+  (04/13/2009)    Tetanus booster: 11/18/2008: Tdap    Pneumococcal vaccine: Pneumovax  (08/17/2009)  Other Screening  Pap smear: Not documented    Mammogram: ASSESSMENT: Negative - BI-RADS 1^MM DIGITAL SCREENING  (07/10/2009)   Smoking status: never  (09/16/2009)  Lipids   Total Cholesterol: 264  (07/06/2009)   LDL: 155  (07/06/2009)   LDL Direct: Not documented   HDL: 38  (07/06/2009)   Triglycerides: 355  (07/06/2009)    SGOT (AST): 16  (08/17/2009)   SGPT (ALT): 10  (08/17/2009)   Alkaline phosphatase: 48  (08/17/2009)   Total bilirubin: 0.4  (08/17/2009)  Self-Management Support :   Personal Goals (by the next clinic visit) :      Personal LDL goal: 100  (06/17/2009)    Patient will work on the following items until the next clinic visit to reach self-care goals:     Medications and monitoring: take my medicines every day  (09/16/2009)     Eating: drink diet soda or water instead of juice or soda, eat more vegetables, eat foods that are low in salt, eat baked foods instead of fried foods  (09/16/2009)     Activity: take a 30 minute walk every day  (09/16/2009)    Lipid self-management support: Written self-care plan, Education handout  (09/16/2009)   Lipid self-care plan printed.   Lipid education handout printed  Process Orders Check Orders Results:     [facility]: ABN not required for this insurance Tests Sent for requisitioning (September 16, 2009 2:39 PM):     09/16/2009: [facility] -- T-CBC w/Diff [[id number]] (signed)     09/23/2009: [facility] -- T-Lipid Profile [phone] (signed)    Appended Document: Lab Order/a1c results    Lab Visit  Laboratory Results   Blood Tests   Date/Time Received: September 16, 2009 2:57 PM`. Date/Time  Reported: Alric Quan  September 16, 2009 2:57 PM   HGBA1C: 5.1%   (Normal Range: Non-Diabetic - 3-6%   Control Diabetic - 6-8%)    Orders Today:

## 2010-08-05 NOTE — Assessment & Plan Note (Signed)
Summary: FU VISIT/DS   Vital Signs:  Patient profile:   45 year old female Menstrual status:  irregular Height:      66 inches (167.64 cm) Weight:      222 pounds (100.91 kg) BMI:     35.96 Temp:     97.6 degrees F (36.44 degrees C) oral Pulse rate:   70 / minute BP sitting:   122 / 78  (right arm)  Vitals Entered By: Angelina Ok RN (January 19, 2010 8:32 AM) CC: Depression Is Patient Diabetic? No Pain Assessment Patient in pain? yes     Location: back, legs Intensity: 8 Type: aching, spasms Onset of pain  Constant Nutritional Status BMI of > 30 = obese CBG Result 102  Have you ever been in a relationship where you felt threatened, hurt or afraid?No   Does patient need assistance? Functional Status Self care Ambulation Normal Comments Increase in Migraines.  Pain goes to shoulders and back. Refills on meds.  Results of blood work.  Discuss with blood.   Primary Care Provider:  [name]  DO  CC:  Depression.  History of Present Illness: [name] comes in today with multiple complaints.  1. Pain: Mostly low back pain. Degenerative disc and spondylolisthesis. Chronic pain. Bilateral knee pain-worse on left. Lower leg pain from neuropathy-pain is burning. Also has severe leg cramps. Left arm pain from tendonopathy.  2. Fatigue: extreme. reports no energy to do daily tasks. possibly related to depression-increased family and life stress.  3. Migraine HA: Taking Zomig and Topamax. Effective.  4. B12 deficiency concerns  5. Chronic Nausea- likely related to medications/polypharmacy  6. "Hurts all over"-chronic pain  7. Post herpetic Neuralgia pain accross abdomen  8. Worried about getting heart disease- and her cholesterol.  9. Recent GI work-up negative.  10. Problems with weight loss, concern about distribution of her body weight and abdominal obesity  Depression History:      The patient is having a depressed mood most of the day but denies diminished  interest in her usual daily activities.        The patient denies that she feels like life is not worth living, denies that she wishes that she were dead, and denies that she has thought about ending her life.         Preventive Screening-Counseling & Management  Alcohol-Tobacco     Alcohol drinks/day: 0     Smoking Status: never  Current Medications (verified): 1)  Nexium 40 Mg Cpdr (Esomeprazole Magnesium) .... Take 1 Tablet By Mouth Two Times A Day 2)  Zomig 5 Mg Tabs (Zolmitriptan) .... Take 1 Tablet By Mouth Once A Day As Needed Migraine 3)  Percocet 5-325 Mg Tabs (Oxycodone-Acetaminophen) .... Take 1 Tablet By Mouth Every 6 Hours As Needed 4)  Alprazolam 2 Mg Tabs (Alprazolam) .... Take 1 Tablet By Mouth Three Times A Day 5)  Tricor 145 Mg  Tabs (Fenofibrate) .... Take 1 Tabelt By Mouth Once Daily 6)  Transderm-Scop  Pt72 (Scopolamine Base Pt72) .... Apply Every 48 Hours As Needed 7)  Soma 250 Mg Tabs (Carisoprodol) .... One Tab By Mouth Three Times A Day Prn 8)  Gyne-Lotrimin 1 %  Crea (Clotrimazole) .... One Application As Directed. Pharmacy May Substitute Equivalent 9)  Voltaren 1 %  Gel (Diclofenac Sodium) .... Use Twice Daily, As Needed For Pain. 10)  Terazol 7 0.4 % Crea (Terconazole) .... Use As Directed 11)  Vaniqa 13.9 % Crea (Eflornithine Hcl) .... Apply As  Directed 12)  Fluconazole 100 Mg Tabs (Fluconazole) .... Take One Pill Daily As Needed For Yeast Infection/thrush 13)  Topamax 50 Mg Tabs (Topiramate) .... Start One Pill Dailly For One Week Then Increase To Twice Daily Until Your Next Visit 14)  Prezista 400 Mg Tabs (Darunavir Ethanolate) .... Take Two Tablets Once A Day With Norvir and Viread 15)  Viread 300 Mg Tabs (Tenofovir Disoproxil Fumarate) .... Take Once A Day With Two Prezistas and One Norvir 16)  Combivir 150-300 Mg Tabs (Lamivudine-Zidovudine) .... Take 1 Tablet By Mouth Two Times A Day 17)  Norvir 100 Mg Tabs (Ritonavir) .... Take One Tablet With The Two  Prezistas and One Viread 18)  Crestor 40 Mg Tabs (Rosuvastatin Calcium) .... Take 1 Tablet By Mouth Once A Day (This Replaces Lipitor) 19)  Promethazine Hcl 50 Mg Tabs (Promethazine Hcl) .... Take One Half To One Whole Tablet Up To Four Times Daily As Needed Nausea 20)  Hibiclens 4 % Liqd (Chlorhexidine Gluconate) .... Use As Directed Two Times A Day 21)  Amitiza 8 Mcg Caps (Lubiprostone) .... Two Times A Day With Meals 22)  Kristalose 20 Gm  Pack (Lactulose) .... 20 Gm Q Hs 23)  Domeperidome 10 Mg .Marland Kitchen.. 1 By Mouth Three Times A Day  Allergies (verified): 1)  ! Darvocet 2)  ! Neurontin 3)  ! Valium 4)  ! * Zofran 5)  ! Pcn 6)  ! Ultram 7)  ! Depakote  Review of Systems      See HPI  Physical Exam  General:  Well-developed,well-nourished,in no acute distress; alert,appropriate and cooperative throughout examination Abdomen:  A mobile irregularlyu shaped mass is palpated just below teh facsial layer of her abdomen-probably a lipoma or scar tissue from lap chole. Painful to palpation. No active skin lesions.  Msk:  Bilateral knee pain left worse than right. Pain on palpation of left knee, crepitus present. ligmants are solid and patella is midline.  Tenderness to palpation of LB/SI. Hyperlordosis. Muscle spasm present. Good ROM in flex/ext Pulses:  normal. No edema varicosities noted-mild. Extremities:  no edema, symetric, no swollen or painful joints. Neurologic:  alert & oriented X3, cranial nerves II-XII intact, and strength normal in all extremities.   Skin:  no rashes.   Psych:  Oriented X3, memory intact for recent and remote, good eye contact, and moderately anxious.  Baseline tangential conversation. Was tearful at one point discussing her health problems.   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 her B12 was bordeline low at 301 so I will just go ahead and give her monthly injections in the setting of  macrocytosis. No need for MMA.   Orders: Admin of Therapeutic Inj  intramuscular or subcutaneous (16109) Vit B12 1000 mcg (J3420)  Hgb: 13.1 (11/02/2009)   Hct: 40.2 (11/02/2009)   Platelets: 146 (11/02/2009) RBC: 3.79 (11/02/2009)   RDW: 13.9 (11/02/2009)   WBC: 3.4 (11/02/2009) MCV: 106.1 (11/02/2009)   MCHC: 32.6 (11/02/2009) Ferritin: 22.8 (11/17/2009) Iron: 72 (11/17/2009)   % Sat: 20.1 (11/17/2009) B12: 301 (11/17/2009)   Folate: 4.5 (11/17/2009)   TSH: 2.41 (11/17/2009)  Problem # 2:  CHRONIC PAIN SYNDROME (ICD-338.4) Assessment: Improved Will hold crestor for 1-2 months given diffuse body aches and pain and then see how she is doing off teh medication.  3 post-dated scripts given for percocet- pain multifactorial- significant Lumbar Listhesis and neuropathy documented. Ideally would like to reduce opiate dose given nausea and  possibilityof rebound HA. [employer] was very resistant to this idea today.   Problem # 3:  ABDOMINAL MASS, RIGHT UPPER QUADRANT (ICD-789.31) Will eval RUQ mass by [place]. Unclear etiology-palpable and painful. Will follow.  Orders: Ultrasound (Ultrasound)  Problem # 4:  BOILS, RECURRENT (ICD-680.9) No active lesions.  Problem # 5:  DEGENERATIVE DISC DISEASE, LUMBOSACRAL SPINE (ICD-722.52) As noted in Chronic Pain discussion. Regular monitoring of High Grade Listhesis.  Problem # 6:  KNEE PAIN, BILATERAL (ICD-719.46) Will eval for early knee OA although I suspect it will be very mild. Needs to work on Publishing copy. Probably has PFTS.  Her updated medication list for this problem includes:    Percocet 5-325 Mg Tabs (Oxycodone-acetaminophen) .Marland Kitchen... Take 1 tablet by mouth every 6 hours as needed    Soma 250 Mg Tabs (Carisoprodol) ..... One tab by mouth three times a day prn  Orders: Diagnostic X-Ray/Fluoroscopy (Diagnostic X-Ray/Flu)  Problem # 7:  LIPODYSTROPHY (ICD-272.6) Present in the setting of HIV dx and Metabolic syndrome. Features of PCOS , but  never confirmed or symptomatic.   Her updated medication list for this problem includes:    Tricor 145 Mg Tabs (Fenofibrate) .Marland Kitchen... Take 1 tabelt by mouth once daily    Crestor 40 Mg Tabs (Rosuvastatin calcium) .Marland Kitchen... Take 1 tablet by mouth once a day (this replaces lipitor)  Complete Medication List: 1)  Nexium 40 Mg Cpdr (Esomeprazole magnesium) .... Take 1 tablet by mouth two times a day 2)  Zomig 5 Mg Tabs (Zolmitriptan) .... Take 1 tablet by mouth once a day as needed migraine 3)  Percocet 5-325 Mg Tabs (Oxycodone-acetaminophen) .... Take 1 tablet by mouth every 6 hours as needed 4)  Alprazolam 2 Mg Tabs (Alprazolam) .... Take 1 tablet by mouth three times a day 5)  Tricor 145 Mg Tabs (Fenofibrate) .... Take 1 tabelt by mouth once daily 6)  Transderm-scop Pt72 (Scopolamine base pt72) .... Apply every 48 hours as needed 7)  Soma 250 Mg Tabs (Carisoprodol) .... One tab by mouth three times a day prn 8)  Gyne-lotrimin 1 % Crea (Clotrimazole) .... One application as directed. pharmacy may substitute equivalent 9)  Voltaren 1 % Gel (Diclofenac sodium) .... Use twice daily, as needed for pain. 10)  Terazol 7 0.4 % Crea (Terconazole) .... Use as directed 11)  Vaniqa 13.9 % Crea (Eflornithine hcl) .... Apply as directed 12)  Fluconazole 100 Mg Tabs (Fluconazole) .... Take one pill daily as needed for yeast infection/thrush 13)  Topamax 50 Mg Tabs (Topiramate) .... Start one pill dailly for one week then increase to twice daily until your next visit 14)  Prezista 400 Mg Tabs (Darunavir ethanolate) .... Take two tablets once a day with norvir and viread 15)  Viread 300 Mg Tabs (Tenofovir disoproxil fumarate) .... Take once a day with two prezistas and one norvir 16)  Combivir 150-300 Mg Tabs (Lamivudine-zidovudine) .... Take 1 tablet by mouth two times a day 17)  Norvir 100 Mg Tabs (Ritonavir) .... Take one tablet with the two prezistas and one viread 18)  Crestor 40 Mg Tabs (Rosuvastatin calcium)  .... Take 1 tablet by mouth once a day (this replaces lipitor) 19)  Promethazine Hcl 50 Mg Tabs (Promethazine hcl) .... Take one half to one whole tablet up to four times daily as needed nausea 20)  Hibiclens 4 % Liqd (Chlorhexidine gluconate) .... Use as directed two times a day 21)  Amitiza 8 Mcg Caps (Lubiprostone) .... Two times a day with meals  22)  Kristalose 20 Gm Pack (Lactulose) .... 20 gm q hs 23)  Domeperidome 10 Mg  .Marland KitchenMarland Kitchen. 1 by mouth three times a day  Other Orders: Capillary Blood Glucose/CBG (75643)  Patient Instructions: 1)  F/U in 3 months Prescriptions: ZOMIG 5 MG TABS (ZOLMITRIPTAN) Take 1 tablet by mouth once a day as needed migraine  #30 x 3   Entered and Authorized by:   [name]  DO   Signed by:   [name]  DO on 01/28/2010   Method used:   Electronically to        [facility] (retail)       [phone] [address]       [place], [place]  [id number]       Ph: [phone]       Fax: [phone]   RxID:   [id number] PERCOCET 5-325 MG TABS (OXYCODONE-ACETAMINOPHEN) Take 1 tablet by mouth every 6 hours as needed  #120 x 0   Entered and Authorized by:   [name]  DO   Signed by:   [name]  DO on 01/19/2010   Method used:   Print then Give to Patient   RxID:   [id number] PERCOCET 5-325 MG TABS (OXYCODONE-ACETAMINOPHEN) Take 1 tablet by mouth every 6 hours as needed  #120 x 0   Entered and Authorized by:   [name]  DO   Signed by:   [name]  DO on 01/19/2010   Method used:   Print then Give to Patient   RxID:   [id number] PERCOCET 5-325 MG TABS (OXYCODONE-ACETAMINOPHEN) Take 1 tablet by mouth every 6 hours as needed  #120 x 0   Entered and Authorized by:   [name]  DO   Signed by:   [name]  DO on 01/19/2010   Method used:   Print then Give to Patient   RxID:   [id number] SOMA 250 MG TABS (CARISOPRODOL) one tab by mouth three times a day prn  #90 x 3   Entered and Authorized  by:   [name]  DO   Signed by:   [name]  DO on 01/19/2010   Method used:   Print then Give to Patient   RxID:   [id number] ALPRAZOLAM 2 MG TABS (ALPRAZOLAM) Take 1 tablet by mouth three times a day  #90 x 3   Entered and Authorized by:   [name]  DO   Signed by:   [name]  DO on 01/19/2010   Method used:   Print then Give to Patient   RxID:   [id number] PERCOCET 5-325 MG TABS (OXYCODONE-ACETAMINOPHEN) Take 1 tablet by mouth every 6 hours as needed  #120 x 0   Entered and Authorized by:   [name]  DO   Signed by:   [name]  DO on 01/19/2010   Method used:   Print then Give to Patient   RxID:   [id number]   Prevention & Chronic Care Immunizations   Influenza vaccine: Fluvax 3+  (04/13/2009)    Tetanus booster: 11/18/2008: Tdap    Pneumococcal vaccine: Pneumovax  (08/17/2009)  Other Screening   Pap smear: Not documented    Mammogram: ASSESSMENT: Negative - BI-RADS 1^MM DIGITAL SCREENING  (07/10/2009)   Smoking status: never  (01/19/2010)  Lipids   Total Cholesterol: 201  (09/23/2009)   LDL: 123  (09/23/2009)   LDL Direct: Not documented   HDL: 35  (  09/23/2009)   Triglycerides: 217  (09/23/2009)    SGOT (AST): 15  (11/02/2009)   SGPT (ALT): 9  (11/02/2009)   Alkaline phosphatase: 52  (11/02/2009)   Total bilirubin: 0.5  (11/02/2009)  Self-Management Support :   Personal Goals (by the next clinic visit) :      Personal LDL goal: 100  (06/17/2009)    Patient will work on the following items until the next clinic visit to reach self-care goals:     Medications and monitoring: take my medicines every day, bring all of my medications to every visit  (01/19/2010)     Eating: drink diet soda or water instead of juice or soda, eat more vegetables, use fresh or frozen vegetables, eat foods that are low in salt, eat baked foods instead of fried foods, eat fruit for snacks and desserts, limit or avoid alcohol  (01/19/2010)      Activity: take a 30 minute walk every day  (01/19/2010)    Lipid self-management support: Written self-care plan, Education handout, Pre-printed educational material, Resources for patients handout  (01/19/2010)   Lipid self-care plan printed.   Lipid education handout printed      Resource handout printed.     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 Patient Level III [71696] 3)  Admin of Therapeutic Inj  intramuscular or subcutaneous [96372] 4)  Vit B12 1000 mcg [J3420] 5)  Ultrasound [Ultrasound] 6)  Diagnostic X-Ray/Fluoroscopy [Diagnostic X-Ray/Flu]

## 2010-08-05 NOTE — Progress Notes (Signed)
Summary: Prior Authorization -Zomig  Phone Note Outgoing Call   Call placed by: Angelina Ok RN,  Nov 02, 2009 2:31 PM Call placed to: Insurer Summary of Call: Call for Prior Authorization for Zomig 5 mg tablets.  Approved 11/02/2009 thru 11/02/2010. Angelina Ok RN  Nov 02, 2009 2:31 PM  Initial call taken by: Angelina Ok RN,  Nov 02, 2009 2:31 PM  Follow-up for Phone Call        med list updated and script sent electronically to pharmacy Follow-up by: [name]  DO,  Nov 02, 2009 3:07 PM

## 2010-08-05 NOTE — Medication Information (Signed)
Summary: PERCOCET/  PERCOCET/   Imported By: Margie Billet 05/17/2010 11:30:30  _____________________________________________________________________  External Attachment:    Type:   Image     Comment:   External Document

## 2010-08-05 NOTE — Procedures (Signed)
Summary: EGD   EGD  Procedure date:  12/02/2005  Findings:      Location: [facility]   Patient Name: [name], Katzman MRN:  Procedure Procedures: Panendoscopy (EGD) CPT: 43235.  Personnel: Endoscopist: Ulyess Mort, MD.  Exam Location: Exam performed in Outpatient Clinic. Outpatient  Patient Consent: Procedure, Alternatives, Risks and Benefits discussed, consent obtained, from patient. Consent was obtained by the RN.  Indications Symptoms: Vomiting.  History  Current Medications: Patient is not currently taking Coumadin.  Pre-Exam Physical: Performed Jan 01, 2002  Entire physical exam was normal. Abdominal exam, Extremity exam, Mental status exam WNL.  Comments: Pt. history reviewed/updated, physical exam performed prior to initiation of sedation? Exam Exam Info: Maximum depth of insertion Duodenum, intended Duodenum. Patient position: on left side. Vocal cords visualized. Gastric retroflexion performed. Images taken. ASA Classification: II. Tolerance: good.  Sedation Meds: Patient assessed and found to be appropriate for moderate (conscious) sedation. Fentanyl 100 mcg. given IV. Versed 10 mg. given IV. Cetacaine Spray 2 sprays given aerosolized.  Monitoring: BP and pulse monitoring done. Oximetry used. Supplemental O2 given  Findings - Normal: Proximal Esophagus to Distal Esophagus. Not Seen: Barrett's esophagus. Esophageal inflammation. Mucosal abnormality. Stricture.  - MUCOSAL ABNORMALITY: Fundus to Antrum. Erythematous mucosa. Comment: mild alkaline gastritis  also somewhat dilated.  - Normal: Duodenal Bulb to Jejunum.   Assessment Abnormal examination, see findings above.  Events  Unplanned Intervention: No unplanned interventions were required.  Unplanned Events: There were no complications. Plans Medication(s): Continue current medications.  Patient Education: Patient given standard instructions for: Mucosal Abnormality.    Comments: not sure why recurrent vomitig ??? diabetes, motility, meds , functional Disposition: After procedure patient sent to recovery. After recovery patient sent home.     This report was created from the original endoscopy report, which was reviewed and signed by the above listed endoscopist.

## 2010-08-05 NOTE — Miscellaneous (Signed)
Summary: RW Update  Clinical Lists Changes  Observations: Added new observation of PAYOR: Medicaid (05/07/2010 10:41) Added new observation of GENDER: Female (05/07/2010 10:41) Added new observation of RACE: White (05/07/2010 10:41)

## 2010-08-05 NOTE — Progress Notes (Signed)
Summary: med refill/gp  Phone Note Refill Request Message from:  Patient on April 15, 2010 12:27 PM  Refills Requested: Medication #1:  PERCOCET 5-325 MG TABS Take 1 tablet by mouth every 6 hours as needed Request refills for Nov., Dec.and Jan. Post date for 1st of each month.   Method Requested: Pick up at Office Initial call taken by: Chinita Pester RN,  April 15, 2010 12:27 PM  Follow-up for Phone Call        [name] will need to come in complete pain contract and give [place] a UDS. Have her come in to get scripts-get urine at that time and give her the contract to review and sign. I will write scripts. Follow-up by: [name]  DO,  April 21, 2010 8:56 AM  Additional Follow-up for Phone Call Additional follow up Details #1::        UDS collected and pain contract signed/copy given to pt.  Rxs. x 3 given to pt.  Pt. c/o urinary burning;Dr. Phillips Odor awared;urine cx done also. Additional Follow-up by: Chinita Pester RN,  May 05, 2010 1:45 PM    Prescriptions: PERCOCET 5-325 MG TABS (OXYCODONE-ACETAMINOPHEN) Take 1 tablet by mouth every 6 hours as needed  #120 x 0   Entered and Authorized by:   [name]  DO   Signed by:   [name]  DO on 04/21/2010   Method used:   Reprint   RxID:   [id number] PERCOCET 5-325 MG TABS (OXYCODONE-ACETAMINOPHEN) Take 1 tablet by mouth every 6 hours as needed  #120 x 0   Entered and Authorized by:   [name]  DO   Signed by:   [name]  DO on 04/21/2010   Method used:   Reprint   RxID:   [id number] PERCOCET 5-325 MG TABS (OXYCODONE-ACETAMINOPHEN) Take 1 tablet by mouth every 6 hours as needed  #120 x 0   Entered and Authorized by:   [name]  DO   Signed by:   [name]  DO on 04/21/2010   Method used:   Reprint   RxID:   [id number] PERCOCET 5-325 MG TABS (OXYCODONE-ACETAMINOPHEN) Take 1 tablet by mouth every 6 hours as needed  #120 x 0   Entered and Authorized by:   [name]  DO   Signed by:   [name]  DO on 04/21/2010   Method used:   Reprint   RxID:   [id number] PERCOCET 5-325 MG TABS (OXYCODONE-ACETAMINOPHEN) Take 1 tablet by mouth every 6 hours as needed  #120 x 0   Entered and Authorized by:   [name]  DO   Signed by:   [name]  DO on 04/21/2010   Method used:   Reprint   RxID:   [id number] PERCOCET 5-325 MG TABS (OXYCODONE-ACETAMINOPHEN) Take 1 tablet by mouth every 6 hours as needed  #120 x 0   Entered and Authorized by:   [name]  DO   Signed by:   [name]  DO on 04/21/2010   Method used:   Print then Give to Patient   RxID:   [id number] PERCOCET 5-325 MG TABS (OXYCODONE-ACETAMINOPHEN) Take 1 tablet by mouth every 6 hours as needed  #120 x 0   Entered and Authorized by:   [name]  DO   Signed by:   [name]  DO on 04/21/2010   Method used:   Print then Give to Patient   RxID:   [id number] PERCOCET 5-325 MG TABS (  OXYCODONE-ACETAMINOPHEN) Take 1 tablet by mouth every 6 hours as needed  #120 x 0   Entered and Authorized by:   [name]  DO   Signed by:   [name]  DO on 04/21/2010   Method used:   Print then Give to Patient   RxID:   [id number] PERCOCET 5-325 MG TABS (OXYCODONE-ACETAMINOPHEN) Take 1 tablet by mouth every 6 hours as needed  #120 x 0   Entered and Authorized by:   [name]  DO   Signed by:   [name]  DO on 04/21/2010   Method used:   Print then Give to Patient   RxID:   [id number]   Process Orders Check Orders Results:     [facility]: ABN not required for this insurance Order queued for requisitioning for [facility]: April 26, 2010 10:36 AM  Tests Sent for requisitioning (May 05, 2010 1:45 PM):     04/15/2010: [facility] -- T-Drug Screen-Urine, (single) [[id number]] (signed)     04/15/2010: [facility] -- T- * Misc. Laboratory test [phone] (signed)

## 2010-08-05 NOTE — Letter (Signed)
Summary: [facility] Instructions   Gastroenterology  [address] [place], [place] [address]   Phone: [phone]  Fax: [phone]       [name]    1966-02-07    MRN: [id number]        Procedure Day /Date: 01/07/10     Arrival Time: 10:00      Procedure Time: 12:00     Location of Procedure:                     _ X_  [facility] ( Outpatient Registration)   PREPARATION FOR COLONOSCOPY WITH MOVIPREP   Starting 5 days prior to your procedure 01/02/10 do not eat nuts, seeds, popcorn, corn, beans, peas,  salads, or any raw vegetables.  Do not take any fiber supplements (e.g. Metamucil, Citrucel, and Benefiber).  THE DAY BEFORE YOUR PROCEDURE         DATE: 01/06/10  DAY: Wednesday  1.  Drink clear liquids the entire day-NO SOLID FOOD  2.  Do not drink anything colored red or purple.  Avoid juices with pulp.  No orange juice.  3.  Drink at least 64 oz. (8 glasses) of fluid/clear liquids during the day to prevent dehydration and help the prep work efficiently.  CLEAR LIQUIDS INCLUDE: Water Jello Ice Popsicles Tea (sugar ok, no milk/cream) Powdered fruit flavored drinks Coffee (sugar ok, no milk/cream) Gatorade Juice: apple, white grape, white cranberry  Lemonade Clear bullion, consomm, broth Carbonated beverages (any kind) Strained chicken noodle soup Hard Candy                             4.  In the morning, mix first dose of MoviPrep solution:    Empty 1 Pouch A and 1 Pouch B into the disposable container    Add lukewarm drinking water to the top line of the container. Mix to dissolve    Refrigerate (mixed solution should be used within 24 hrs)  5.  Begin drinking the prep at 5:00 p.m. The MoviPrep container is divided by 4 marks.   Every 15 minutes drink the solution down to the next mark (approximately 8 oz) until the full liter is complete.   6.  Follow completed prep with 16 oz of clear liquid of your choice (Nothing red or purple).   Continue to drink clear liquids until bedtime.  7.  Before going to bed, mix second dose of MoviPrep solution:    Empty 1 Pouch A and 1 Pouch B into the disposable container    Add lukewarm drinking water to the top line of the container. Mix to dissolve    Refrigerate  THE DAY OF YOUR PROCEDURE      DATE: 01/07/10  DAY: Thursday  Beginning at 7:00a.m. (5 hours before procedure):         1. Every 15 minutes, drink the solution down to the next mark (approx 8 oz) until the full liter is complete.      2.  Do not eat or drink anything after midnight except for the prep solution.   MEDICATION INSTRUCTIONS  Unless otherwise instructed, you should take regular prescription medications with a small sip of water   as early as possible the morning of your procedure.                   OTHER INSTRUCTIONS  You will need a responsible adult at least  44 years of age to accompany you and drive you home.   This person must remain in the waiting room during your procedure.  Wear loose fitting clothing that is easily removed.  Leave jewelry and other valuables at home.  However, you may wish to bring a book to read or  an iPod/MP3 player to listen to music as you wait for your procedure to start.  Remove all body piercing jewelry and leave at home.  Total time from sign-in until discharge is approximately 2-3 hours.  You should go home directly after your procedure and rest.  You can resume normal activities the  day after your procedure.  The day of your procedure you should not:   Drive   Make legal decisions   Operate machinery   Drink alcohol   Return to work  You will receive specific instructions about eating, activities and medications before you leave.    The above instructions have been reviewed and explained to me by   _______________________    I fully understand and can verbalize these instructions _____________________________ Date _________

## 2010-08-05 NOTE — Progress Notes (Signed)
Summary: [facility] Referral Form  [facility] Referral Form   Imported By: Marily Memos 08/19/2009 09:03:09  _____________________________________________________________________  External Attachment:    Type:   Image     Comment:   External Document

## 2010-08-05 NOTE — Assessment & Plan Note (Signed)
Summary: F/U/VS   Visit Type:  Follow-up Primary Provider:  [name]  DO  CC:  1 month follow up.  History of Present Illness: 45 year old lady with HIV who failed combivir sustiva with 184I and K103 whom I changed to norvir boosted darunavir, tenofovir along with continuing her combivir. She is here for followup roughly one month after startint her new regimen. She takes the majority of her medicaiotns at night and tries to premedicate with phenergan taking her other dose of comibivr in the morning.  She is still vomiting in the morning a fair amount but thinks she is keeping down her medicaitons. She has numerious other active complaints including her lower back pain, and chronic headaches.  Problems Prior to Update: 1)  Sunburn  (ICD-692.71) 2)  Degenerative Disc Disease, Lumbosacral Spine  (ICD-722.52) 3)  Inguinal Hernias, Bilateral  (ICD-550.92) 4)  Laceration of Finger  (ICD-883.0) 5)  Sexual Activity, High Risk  (ICD-V69.2) 6)  Hyperlipidemia  (ICD-272.4) 7)  Acquired Spondylolisthesis  (ICD-738.4) 8)  Iliotibial Band Syndrome, Bilateral  (ICD-728.89) 9)  Sinusitis, Recurrent  (ICD-473.9) 10)  Venous Insufficiency, Mild  (ICD-459.81) 11)  Myalgia  (ICD-729.1) 12)  Knee Pain, Bilateral  (ICD-719.46) 13)  Hirsutism  (ICD-704.1) 14)  Nausea and Vomiting  (ICD-787.01) 15)  Urinary Incontinence  (ICD-788.30) 16)  Back Pain  (ICD-724.5) 17)  Hx of Dvt  (ICD-453.40) 18)  Obesity Nos  (ICD-278.00) 19)  Non-hodgkin's Lymphoma, Hx of  (ICD-V10.79) 20)  Peripheral Neuropathy  (ICD-356.9) 21)  Hypertension  (ICD-401.9) 22)  Diabetes Mellitus, Type II  (ICD-250.00) 23)  Asthma  (ICD-493.90) 24)  Anxiety  (ICD-300.00) 25)  Hyperlipidemia  (ICD-272.4) 26)  Depression  (ICD-311) 27)  Genital Herpes, Hx of  (ICD-V13.8) 28)  Gastroparesis  (ICD-536.3) 29)  Gerd  (ICD-530.81) 30)  Irritable Bowel Syndrome  (ICD-564.1) 31)  HIV Infection  (ICD-042) 32)  Migraine Headache   (ICD-346.90) 33)  Hx of Esophagitis  (ICD-530.10)  Medications Prior to Update: 1)  Nexium 40 Mg Cpdr (Esomeprazole Magnesium) .... Take 1 Tablet By Mouth Two Times A Day 2)  Zomig 5 Mg  Tabs (Zolmitriptan) .... One Tab By Mouth As Needed 3)  Cymbalta 30 Mg  Cpep (Duloxetine Hcl) .... Take 1 Tablet By Mouth Once A Day 4)  Percocet 5-325 Mg Tabs (Oxycodone-Acetaminophen) .... Take 1 Tablet By Mouth Every 6 Hours As Needed 5)  Alprazolam 2 Mg Tabs (Alprazolam) .... Take 1 Tablet By Mouth Three Times A Day 6)  Tricor 145 Mg  Tabs (Fenofibrate) .... Take 1 Tabelt By Mouth Once Daily 7)  Transderm-Scop  Pt72 (Scopolamine Base Pt72) .... Apply Every 48 Hours As Needed 8)  Soma 250 Mg Tabs (Carisoprodol) .... One Tab By Mouth Three Times A Day Prn 9)  Promethazine Hcl 25 Mg  Tabs (Promethazine Hcl) .... Take 1 Tablet By Mouth Two Times A Day 10)  Lidoderm 5 %  Ptch (Lidocaine) .... Apply One Patch To Affected Area Every 12 Hours, Then Off For 12 Hours Then Re-Apply. 11)  Gyne-Lotrimin 1 %  Crea (Clotrimazole) .... One Application As Directed. Pharmacy May Substitute Equivalent 12)  Voltaren 1 %  Gel (Diclofenac Sodium) .... Use Twice Daily, As Needed For Pain. 13)  Propranolol Hcl 10 Mg Tabs (Propranolol Hcl) .... Take 1 Tablet By Mouth Once A Day 14)  Amitriptyline Hcl 50 Mg Tabs (Amitriptyline Hcl) .... Take 2 Tabs At Bedtime For Sleep 15)  Terazol 7 0.4 % Crea (Terconazole) .Marland KitchenMarland KitchenMarland Kitchen  Use As Directed 16)  Vaniqa 13.9 % Crea (Eflornithine Hcl) .... Apply As Directed 17)  Capzasin Quick Relief 0.025-10 % Gel (Capsaicin-Menthol) .... Apply To Affected Are Three Times A Day As Needed 18)  Fluconazole 100 Mg Tabs (Fluconazole) .... Take One Pill Daily As Needed For Yeast Infection/thrush 19)  Plan B One-Step 1.5 Mg Tabs (Levonorgestrel) .... Take As Directed 20)  Topamax 50 Mg Tabs (Topiramate) .... Start One Pill Dailly For One Week Then Increase To Twice Daily Until Your Next Visit 21)  Lyrica 25 Mg Caps  (Pregabalin) .... One Tablet By Mouth Three Times A Day 22)  Miralax  Powd (Polyethylene Glycol 3350) .... Dissolve 1 Capful in 4-8 Oz of Liquid and Drink Once Daily As Needed For Constipation 23)  Colace 100 Mg Caps (Docusate Sodium) .... Please Take One Tab By Mouth Three Times Daily. 24)  Prezista 400 Mg Tabs (Darunavir Ethanolate) .... Take Two Tablets Once A Day With Norvir and Viread 25)  Viread 300 Mg Tabs (Tenofovir Disoproxil Fumarate) .... Take Once A Day With Two Prezistas and One Norvir 26)  Combivir 150-300 Mg Tabs (Lamivudine-Zidovudine) .... Take 1 Tablet By Mouth Two Times A Day 27)  Norvir 100 Mg Tabs (Ritonavir) .... Take One Tablet With The Two Prezistas and One Viread 28)  Crestor 40 Mg Tabs (Rosuvastatin Calcium) .... Take 1 Tablet By Mouth Once A Day (This Replaces Lipitor) 29)  Cleocin 150 Mg Caps (Clindamycin Hcl) .... Take 1 Tablet By Mouth Two Times A Day  Current Medications (verified): 1)  Nexium 40 Mg Cpdr (Esomeprazole Magnesium) .... Take 1 Tablet By Mouth Two Times A Day 2)  Zomig 5 Mg  Tabs (Zolmitriptan) .... One Tab By Mouth As Needed 3)  Percocet 5-325 Mg Tabs (Oxycodone-Acetaminophen) .... Take 1 Tablet By Mouth Every 6 Hours As Needed 4)  Alprazolam 2 Mg Tabs (Alprazolam) .... Take 1 Tablet By Mouth Three Times A Day 5)  Tricor 145 Mg  Tabs (Fenofibrate) .... Take 1 Tabelt By Mouth Once Daily 6)  Transderm-Scop  Pt72 (Scopolamine Base Pt72) .... Apply Every 48 Hours As Needed 7)  Soma 250 Mg Tabs (Carisoprodol) .... One Tab By Mouth Three Times A Day Prn 8)  Lidoderm 5 %  Ptch (Lidocaine) .... Apply One Patch To Affected Area Every 12 Hours, Then Off For 12 Hours Then Re-Apply. 9)  Gyne-Lotrimin 1 %  Crea (Clotrimazole) .... One Application As Directed. Pharmacy May Substitute Equivalent 10)  Voltaren 1 %  Gel (Diclofenac Sodium) .... Use Twice Daily, As Needed For Pain. 11)  Terazol 7 0.4 % Crea (Terconazole) .... Use As Directed 12)  Vaniqa 13.9 % Crea  (Eflornithine Hcl) .... Apply As Directed 13)  Fluconazole 100 Mg Tabs (Fluconazole) .... Take One Pill Daily As Needed For Yeast Infection/thrush 14)  Topamax 50 Mg Tabs (Topiramate) .... Start One Pill Dailly For One Week Then Increase To Twice Daily Until Your Next Visit 15)  Prezista 400 Mg Tabs (Darunavir Ethanolate) .... Take Two Tablets Once A Day With Norvir and Viread 16)  Viread 300 Mg Tabs (Tenofovir Disoproxil Fumarate) .... Take Once A Day With Two Prezistas and One Norvir 17)  Combivir 150-300 Mg Tabs (Lamivudine-Zidovudine) .... Take 1 Tablet By Mouth Two Times A Day 18)  Norvir 100 Mg Tabs (Ritonavir) .... Take One Tablet With The Two Prezistas and One Viread 19)  Crestor 40 Mg Tabs (Rosuvastatin Calcium) .... Take 1 Tablet By Mouth Once A Day (This Replaces Lipitor)  20)  Cleocin 150 Mg Caps (Clindamycin Hcl) .... Take 1 Tablet By Mouth Two Times A Day 21)  Promethazine Hcl 50 Mg Tabs (Promethazine Hcl) .... Take One Half To One Whole Tablet Up To Four Times Daily As Needed Nausea  Allergies: 1)  ! Darvocet 2)  ! Neurontin 3)  ! Valium 4)  ! * Zofran 5)  ! Pcn 6)  ! Ultram 7)  ! Depakote   Preventive Screening-Counseling & Management  Alcohol-Tobacco     Alcohol drinks/day: 0     Smoking Status: never  Caffeine-Diet-Exercise     Caffeine use/day: coffee, tea and sodas     Does Patient Exercise: yes     Type of exercise: WALK     Exercise (avg: min/session): 30-60     Times/week: <3  Safety-Violence-Falls     Seat Belt Use: yes   Current Allergies: ! DARVOCET ! NEURONTIN ! VALIUM ! * ZOFRAN ! PCN ! ULTRAM ! DEPAKOTE Past History:  Past Medical History: Last updated: 04/29/2008 Hx Non-Hodgkins Lymphoma Stage IIA Recurrent Sinusitis History of Peptic ulcer disease-EGD 1/99-Dr. 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  Family History: Last updated: 04/29/2008 Mom - HTN, hx of TIA Dad - died at 38 yo of suicide-(GSW  when she was 15 yrs.)  Social History: Last updated: 03/24/2008 Single Lives alone No ETOH No Tobacco Abuse  Risk Factors: Alcohol Use: 0 (07/06/2009) Caffeine Use: coffee, tea and sodas (07/06/2009) Exercise: yes (07/06/2009)  Risk Factors: Smoking Status: never (07/06/2009)  Family History: Reviewed history from 04/29/2008 and no changes required. Mom - HTN, hx of TIA Dad - died at 65 yo of suicide-(GSW  when she was 15 yrs.)  Social History: Reviewed history from 03/24/2008 and no changes required. Single Lives alone No ETOH No Tobacco Abuse  Review of Systems       The patient complains of anorexia, headaches, abdominal pain, severe indigestion/heartburn, and depression.  The patient denies fever, weight loss, weight gain, chest pain, syncope, dyspnea on exertion, peripheral edema, prolonged cough, hemoptysis, melena, hematochezia, hematuria, incontinence, genital sores, muscle weakness, suspicious skin lesions, unusual weight change, abnormal bleeding, and enlarged lymph nodes.    Vital Signs:  Patient profile:   45 year old female Menstrual status:  irregular Height:      66 inches (167.64 cm) Weight:      232.3 pounds (105.59 kg) BMI:     37.63 Temp:     97.0 degrees F (36.11 degrees C) oral Pulse rate:   81 / minute BP sitting:   123 / 89  (right arm)  Vitals Entered By: Baxter Hire) (July 06, 2009 9:27 AM) CC: 1 month follow up Is Patient Diabetic? No Pain Assessment Patient in pain? yes     Location: lower back and both legs Intensity: 10 Type: aching,burning,stinging,numbness Onset of pain  Constant Nutritional Status BMI of > 30 = obese Nutritional Status Detail appetite is good per patient CBG Result 76  Does patient need assistance? Functional Status Self care Ambulation Normal   Physical Exam  General:  alert, well-nourished, well-hydrated, and overweight-appearing.   Head:  normocephalic and atraumatic.   Eyes:  vision grossly intact, pupils equal, and pupils round.   Ears:  no external deformities and ear piercing(s) noted.   Nose:  no external deformity, no external erythema, and no nasal discharge.   Mouth:  pharynx pink and moist, no erythema, and no exudates.   Neck:  supple and full ROM.   Lungs:  normal respiratory effort, no crackles, and no wheezes.   Heart:  normal rate, regular rhythm, no murmur, no gallop, and no rub.   Abdomen:  soft, non-tender, normal bowel sounds, and no distention.   Msk:  normal ROM and no joint deformities.   Extremities:  1+ left pedal edema and 1+ right pedal edema.   Neurologic:  alert & oriented X3, strength normal in all extremities, and gait normal.          Medication Adherence: 07/06/2009   Adherence to medications reviewed with patient. Counseling to provide adequate adherence provided   Prevention For Positives: 07/06/2009   Safe sex practices discussed with patient. Condoms offered.   Education Materials Provided: 07/06/2009 Safe sex practices discussed with patient. Condoms offered.                          Impression & Recommendations:  Problem # 1:  HIV INFECTION (ICD-042) recheck labs. Protease inhibitor regimen is really what she needs. I may consider tweaking her ARV slightly to truvada, norvir and prezista with twice daily AZT Her updated medication list for this  problem includes:    Fluconazole 100 Mg Tabs (Fluconazole) .Marland Kitchen... Take one pill daily as needed for yeast infection/thrush    Cleocin 150 Mg Caps (Clindamycin hcl) .Marland Kitchen... Take 1 tablet by mouth two times a day  Orders: T-RPR (Syphilis) [phone]) T-CD4SP [facility]) (CD4SP) T-HIV Viral Load [phone]) T-Comprehensive Metabolic Panel [phone]) T-CBC w/Diff ([id number]) Est. Patient Level IV (99214)Future Orders: T-CD4SP ([facility]) (CD4SP) ... 08/17/2009 T-HIV Viral Load [phone]) ... 08/17/2009 T-CBC w/Diff ([id number]) ... 08/17/2009 T-Comprehensive Metabolic Panel [phone]) ... 08/17/2009 T-Chlamydia  Probe, urine [phone]) ... 08/17/2009 T-GC Probe, urine [phone]) ... 08/17/2009  Diagnostics Reviewed:  HIV: CDC-defined AIDS (06/04/2009)   CD4: 460 (05/14/2009)   WBC: 5.2 (04/13/2009)   Hgb: 14.4 (04/13/2009)   HCT: 44.9 (04/13/2009)   Platelets: 146 (04/13/2009) HIV genotype: * (05/13/2009)   HIV-1 RNA: 89000 (05/13/2009)     Problem # 2:  HYPERLIPIDEMIA (ICD-272.4) Assessment: Comment Only This may worsen with proteast inhibitors on board. Her updated medication list for this problem includes:    Tricor 145 Mg Tabs (Fenofibrate) .Marland Kitchen... Take 1 tabelt by mouth once daily    Crestor 40 Mg Tabs (Rosuvastatin calcium) .Marland Kitchen... Take 1 tablet by mouth once a day (this replaces lipitor)  Orders: T-Lipid Profile ([id number]) Est. Patient Level IV (35573)  Problem # 3:  NAUSEA AND VOMITING (ICD-787.01) Assessment: Comment Only  Chronic and partly related to her mutiple chronic GI issues, but unfortunately the PIs and in particular norvir may be making this worse. Gave her script for phenergan at 50mg  dose  Orders: Est. Patient Level IV (22025)  Problem # 4:  BACK PAIN (ICD-724.5)  Follwed by Dr. Phillips Odor as PCP for this. Her updated medication list for this problem includes:    Percocet 5-325 Mg Tabs (Oxycodone-acetaminophen) .Marland Kitchen... Take 1 tablet by mouth  every 6 hours as needed    Soma 250 Mg Tabs (Carisoprodol) .Marland KitchenMarland KitchenMarland KitchenMarland Kitchen  One tab by mouth three times a day prn  Orders: Est. Patient Level IV (57322)  Problem # 5:  NON-HODGKIN'S LYMPHOMA, HX OF (ICD-V10.79) Assessment: Comment Only has not had evidence of recurrence, being followed by oncology Orders: Est. Patient Level IV (02542)  Problem # 6:  IRRITABLE BOWEL SYNDROME (ICD-564.1) see above discussion re nausea and vomiting Orders: Est. Patient Level IV (70623)  Problem # 7:  GASTROPARESIS (ICD-536.3) this is also part ofher crhonic GI issues Orders: Est. Patient Level IV (76283)  Medications Added to Medication List This Visit: 1)  Phenergan 50 Mg/ml Soln (Promethazine hcl) .... Take one half to one whole tablet up to four times daily as needed nausea 2)  Promethazine Hcl 50 Mg Tabs (Promethazine hcl) .... Take one half to one whole tablet up to four times daily as needed nausea  Other Orders: T-Hgb A1C (in-house) [phone]) T- Capillary Blood Glucose (07371)  Patient Instructions: 1)  Make fu appt with Dr. Daiva Eves in  February    Pneumonia Vaccine (to be given today) Prescriptions: PROMETHAZINE HCL 50 MG TABS (PROMETHAZINE HCL) take one half to one whole tablet up to four times daily as needed nausea  #60 x 0   Entered and Authorized by:   Acey Lav MD   Signed by:   Paulette Blanch Dam MD on 07/06/2009   Method used:   Print then Give to Patient   RxID:   [id number] PHENERGAN 50 MG/ML SOLN (PROMETHAZINE HCL) take one half to one whole tablet up to four times daily as needed nausea  #60 x 4   Entered and Authorized by:   Acey Lav MD   Signed by:   Paulette Blanch Dam MD on 07/06/2009   Method used:   Print then Give to Patient   RxID:   [id number] PHENERGAN 50 MG/ML SOLN (PROMETHAZINE HCL) take one half to one whole tablet up to four times daily as needed nausea  #60 x 4   Entered and Authorized by:   Acey Lav MD   Signed by:   Paulette Blanch Dam MD on 07/06/2009   Method used:   Electronically to        [facility] (retail)       [id number]       [place], [place]  [id number]       Ph: [phone]       Fax: [phone]   RxID:   [id number]  Process Orders Check Orders Results:     [facility]: ABN not required for this insurance Tests Sent for requisitioning (July 06, 2009 11:53 PM):     07/06/2009: [facility] -- T-RPR (Syphilis) [phone] (signed)     07/06/2009: [facility] -- T-HIV Viral Load [phone] (signed)     07/06/2009: [facility] -- T-Comprehensive Metabolic Panel [[id number]] (signed)     07/06/2009: [facility] -- T-CBC w/Diff [[id number]] (signed)     08/17/2009: [facility] -- T-HIV Viral Load [id number] (signed)     08/17/2009: [facility] -- T-CBC w/Diff [[id number]] (signed)     08/17/2009: [facility] -- T-Comprehensive Metabolic Panel [[id number]] (signed)     08/17/2009: [facility] -- T-Chlamydia  Probe, urine [phone] (signed)     08/17/2009: [facility] -- T-GC Probe, urine [id number] (signed)     07/06/2009: [facility] -- T-Lipid Profile [phone] (  signed)   Process Orders Check Orders Results:     [facility]: ABN not required for this insurance Tests Sent for requisitioning (July 06, 2009 11:53 PM):     07/06/2009: [facility] -- T-RPR (Syphilis) [phone] (signed)     07/06/2009: [facility] -- T-HIV Viral Load [phone] (signed)     07/06/2009: [facility] -- T-Comprehensive Metabolic Panel [[id number]] (signed)     07/06/2009: [facility] -- T-CBC w/Diff [[id number]] (signed)     08/17/2009: [facility] -- T-HIV Viral Load  [phone] (signed)     08/17/2009: [facility] -- T-CBC w/Diff [[id number]] (signed)     08/17/2009: [facility] -- T-Comprehensive Metabolic Panel [[id number]] (signed)     08/17/2009: [facility] -- T-Chlamydia  Probe, urine [phone] (signed)     08/17/2009: [facility] -- T-GC Probe, urine [phone] (signed)     07/06/2009: [facility] -- T-Lipid Profile [phone] (signed)   Laboratory Results   Blood Tests   Date/Time Received: July 06, 2009 10:18 AM Date/Time Reported: Alric Quan  July 06, 2009 10:18 AM   HGBA1C: 5.2%   (Normal Range: Non-Diabetic - 3-6%   Control Diabetic - 6-8%) CBG Random:: 76mg /dL

## 2010-08-05 NOTE — Assessment & Plan Note (Signed)
Summary: F/U/VS   Visit Type:  Follow-up Primary Provider:  [name]  DO  CC:  f/u , sinus pain, throat irritation since starting meds/TY, and Lipid Management.  History of Present Illness: 45 yo Caucasian lady with HIV, Resitant virus (failed combivir and sustiva) on salvage regimen of combivir, viread and boosted prezista, with multiple comorbidities including hyperlipidemia, migrainous headaches, diabetes. She has brought her viral load down nicely on her new regimen to 600s, cd4 still in the 400s. She is still requiring phenergan to control her nausea and at times 50mg  tablets. She says she also frequently feels "stoned." I explained that this could be due to norvir inhibiting the metabolism of sedating medications such as alprazolam. She states that her "diabetes" is no longer active and that she has cured with diet. She has also noticed recently an area that is blood colored in the back of her throat. She continues to suffer from migrainous headaches as well as intermittent nose pain and rhinitis. She i swithout facial, dental pain or fever.  Lipid Management History:      Positive NCEP/ATP III risk factors include HDL cholesterol less than 40 and hypertension.  Negative NCEP/ATP III risk factors include female age less than 51 years old, no family history for ischemic heart disease, non-tobacco-user status, no ASHD (atherosclerotic heart disease), no prior stroke/TIA, no peripheral vascular disease, and no history of aortic aneurysm.    Problems Prior to Update: 1)  Lipomas, Multiple  (ICD-214.9) 2)  Sunburn  (ICD-692.71) 3)  Degenerative Disc Disease, Lumbosacral Spine  (ICD-722.52) 4)  Inguinal Hernias, Bilateral  (ICD-550.92) 5)  Laceration of Finger  (ICD-883.0) 6)  Sexual Activity, High Risk  (ICD-V69.2) 7)  Hyperlipidemia  (ICD-272.4) 8)  Acquired Spondylolisthesis  (ICD-738.4) 9)  Iliotibial Band Syndrome, Bilateral  (ICD-728.89) 10)  Sinusitis, Recurrent   (ICD-473.9) 11)  Venous Insufficiency, Mild  (ICD-459.81) 12)  Myalgia  (ICD-729.1) 13)  Knee Pain, Bilateral  (ICD-719.46) 14)  Hirsutism  (ICD-704.1) 15)  Nausea and Vomiting  (ICD-787.01) 16)  Urinary Incontinence  (ICD-788.30) 17)  Back Pain  (ICD-724.5) 18)  Hx of Dvt  (ICD-453.40) 19)  Obesity Nos  (ICD-278.00) 20)  Non-hodgkin's Lymphoma, Hx of  (ICD-V10.79) 21)  Peripheral Neuropathy  (ICD-356.9) 22)  Hypertension  (ICD-401.9) 23)  Diabetes Mellitus, Type II  (ICD-250.00) 24)  Asthma  (ICD-493.90) 25)  Anxiety  (ICD-300.00) 26)  Hyperlipidemia  (ICD-272.4) 27)  Depression  (ICD-311) 28)  Genital Herpes, Hx of  (ICD-V13.8) 29)  Gastroparesis  (ICD-536.3) 30)  Gerd  (ICD-530.81) 31)  Irritable Bowel Syndrome  (ICD-564.1) 32)  HIV Infection  (ICD-042) 33)  Migraine Headache  (ICD-346.90) 34)  Hx of Esophagitis  (ICD-530.10)  Medications Prior to Update: 1)  Nexium 40 Mg Cpdr (Esomeprazole Magnesium) .... Take 1 Tablet By Mouth Two Times A Day 2)  Zomig 5 Mg  Tabs (Zolmitriptan) .... One Tab By Mouth As Needed 3)  Percocet 5-325 Mg Tabs (Oxycodone-Acetaminophen) .... Take 1 Tablet By Mouth Every 6 Hours As Needed 4)  Alprazolam 2 Mg Tabs (Alprazolam) .... Take 1 Tablet By Mouth Three Times A Day 5)  Tricor 145 Mg  Tabs (Fenofibrate) .... Take 1 Tabelt By Mouth Once Daily 6)  Transderm-Scop  Pt72 (Scopolamine Base Pt72) .... Apply Every 48 Hours As Needed 7)  Soma 250 Mg Tabs (Carisoprodol) .... One Tab By Mouth Three Times A Day Prn 8)  Gyne-Lotrimin 1 %  Crea (Clotrimazole) .... One Application As Directed. Pharmacy May  Substitute Equivalent 9)  Voltaren 1 %  Gel (Diclofenac Sodium) .... Use Twice Daily, As Needed For Pain. 10)  Terazol 7 0.4 % Crea (Terconazole) .... Use As Directed 11)  Vaniqa 13.9 % Crea (Eflornithine Hcl) .... Apply As Directed 12)  Fluconazole 100 Mg Tabs (Fluconazole) .... Take One Pill Daily As Needed For Yeast Infection/thrush 13)  Topamax 50 Mg  Tabs (Topiramate) .... Start One Pill Dailly For One Week Then Increase To Twice Daily Until Your Next Visit 14)  Prezista 400 Mg Tabs (Darunavir Ethanolate) .... Take Two Tablets Once A Day With Norvir and Viread 15)  Viread 300 Mg Tabs (Tenofovir Disoproxil Fumarate) .... Take Once A Day With Two Prezistas and One Norvir 16)  Combivir 150-300 Mg Tabs (Lamivudine-Zidovudine) .... Take 1 Tablet By Mouth Two Times A Day 17)  Norvir 100 Mg Tabs (Ritonavir) .... Take One Tablet With The Two Prezistas and One Viread 18)  Crestor 40 Mg Tabs (Rosuvastatin Calcium) .... Take 1 Tablet By Mouth Once A Day (This Replaces Lipitor) 19)  Promethazine Hcl 50 Mg Tabs (Promethazine Hcl) .... Take One Half To One Whole Tablet Up To Four Times Daily As Needed Nausea  Current Medications (verified): 1)  Nexium 40 Mg Cpdr (Esomeprazole Magnesium) .... Take 1 Tablet By Mouth Two Times A Day 2)  Zomig 5 Mg  Tabs (Zolmitriptan) .... One Tab By Mouth As Needed 3)  Percocet 5-325 Mg Tabs (Oxycodone-Acetaminophen) .... Take 1 Tablet By Mouth Every 6 Hours As Needed 4)  Alprazolam 2 Mg Tabs (Alprazolam) .... Take 1 Tablet By Mouth Three Times A Day 5)  Tricor 145 Mg  Tabs (Fenofibrate) .... Take 1 Tabelt By Mouth Once Daily 6)  Transderm-Scop  Pt72 (Scopolamine Base Pt72) .... Apply Every 48 Hours As Needed 7)  Soma 250 Mg Tabs (Carisoprodol) .... One Tab By Mouth Three Times A Day Prn 8)  Gyne-Lotrimin 1 %  Crea (Clotrimazole) .... One Application As Directed. Pharmacy May Substitute Equivalent 9)  Voltaren 1 %  Gel (Diclofenac Sodium) .... Use Twice Daily, As Needed For Pain. 10)  Terazol 7 0.4 % Crea (Terconazole) .... Use As Directed 11)  Vaniqa 13.9 % Crea (Eflornithine Hcl) .... Apply As Directed 12)  Fluconazole 100 Mg Tabs (Fluconazole) .... Take One Pill Daily As Needed For Yeast Infection/thrush 13)  Topamax 50 Mg Tabs (Topiramate) .... Start One Pill Dailly For One Week Then Increase To Twice Daily Until Your  Next Visit 14)  Prezista 400 Mg Tabs (Darunavir Ethanolate) .... Take Two Tablets Once A Day With Norvir and Viread 15)  Viread 300 Mg Tabs (Tenofovir Disoproxil Fumarate) .... Take Once A Day With Two Prezistas and One Norvir 16)  Combivir 150-300 Mg Tabs (Lamivudine-Zidovudine) .... Take 1 Tablet By Mouth Two Times A Day 17)  Norvir 100 Mg Tabs (Ritonavir) .... Take One Tablet With The Two Prezistas and One Viread 18)  Crestor 40 Mg Tabs (Rosuvastatin Calcium) .... Take 1 Tablet By Mouth Once A Day (This Replaces Lipitor) 19)  Promethazine Hcl 50 Mg Tabs (Promethazine Hcl) .... Take One Half To One Whole Tablet Up To Four Times Daily As Needed Nausea  Allergies: 1)  ! Darvocet 2)  ! Neurontin 3)  ! Valium 4)  ! * Zofran 5)  ! Pcn 6)  ! Ultram 7)  ! Depakote    Preventive Screening-Counseling & Management  Alcohol-Tobacco     Alcohol drinks/day: 0     Smoking Status: never  Caffeine-Diet-Exercise  Caffeine use/day: coffee, tea and sodas     Does Patient Exercise: yes     Type of exercise: WALK     Exercise (avg: min/session): 30-60     Times/week: <3  Safety-Violence-Falls     Seat Belt Use: yes      Drug Use:  no.     Current Allergies (reviewed today): ! DARVOCET ! NEURONTIN ! VALIUM ! * ZOFRAN ! PCN ! ULTRAM ! DEPAKOTE Family History: Reviewed history from 04/29/2008 and no changes required. Mom - HTN, hx of TIA Dad - died at 31 yo of suicide-(GSW  when she was 15 yrs.)  Social History: Reviewed history from 03/24/2008 and no changes required. Single Lives alone No ETOH No Tobacco Abuse Tobacco Use:  yes  Review of Systems       The patient complains of headaches, abdominal pain, severe indigestion/heartburn, and depression.  The patient denies anorexia, fever, weight loss, weight gain, vision loss, decreased hearing, hoarseness, chest pain, syncope, dyspnea on exertion, peripheral edema, prolonged cough, hemoptysis, hematochezia, hematuria,  incontinence, genital sores, muscle weakness, suspicious skin lesions, transient blindness, difficulty walking, unusual weight change, abnormal bleeding, and enlarged lymph nodes.    Vital Signs:  Patient profile:   45 year old female Menstrual status:  irregular Height:      66 inches (167.64 cm) Weight:      228 pounds (103.64 kg) BMI:     36.93 Pulse rate:   80 / minute BP sitting:   113 / 75  (left arm)  Vitals Entered By: Starleen Arms CMA (August 17, 2009 3:31 PM) CC: f/u , sinus pain, throat irritation since starting meds/TY, Lipid Management Is Patient Diabetic? No Pain Assessment Patient in pain? no      Nutritional Status BMI of > 30 = obese Nutritional Status Detail nl CBG Result 109  Does patient need assistance? Functional Status Self care Ambulation Normal   Physical Exam  General:  alert, well-nourished, well-hydrated, and overweight-appearing.   Head:  normocephalic and atraumatic.   Eyes:  vision grossly intact, pupils equal, and pupils round.   Ears:  no external deformities and ear piercing(s) noted.   Nose:  no external deformity, no external erythema, and no nasal discharge.   Mouth:  pharynx pink and moist, no erythema, and no exudates, there were tow areas of faintly dark pigmentation on palate of unclear signfiicance. Did not look like HPV Lungs:  normal respiratory effort, no crackles, and no wheezes.   Heart:  normal rate, regular rhythm, no murmur, no gallop, and no rub.   Abdomen:  soft, non-tender, normal bowel sounds, and no distention.   Msk:  normal ROM and no joint deformities.   Extremities:  1+ left pedal edema and 1+ right pedal edema.   Neurologic:  alert & oriented X3, strength normal in all extremities, and gait normal.   Skin:  turgor normal and no ecchymoses.   Psych:  Oriented X3, memory intact for recent and remote, normally interactive,        Medication Adherence: 08/17/2009   Adherence to medications reviewed with  patient. Counseling to provide adequate adherence provided   Prevention For Positives: 08/17/2009   Safe sex practices discussed with patient. Condoms offered.   Education Materials Provided: 08/17/2009 Safe sex practices discussed with patient. Condoms offered.                          Impression & Recommendations:  Problem #  1:  HIV INFECTION (ICD-042) Assessment Improved Recheck her vl and cd4 count today and then bring her back for recheck in 4 wks and fu visit Her updated medication list for this problem includes:    Fluconazole 100 Mg Tabs (Fluconazole) .Marland Kitchen... Take one pill daily as needed for yeast infection/thrush  Orders: T-CD4SP [facility]) (CD4SP) T-HIV Viral Load [id number]) T-CBC w/Diff [phone]) T-Comprehensive Metabolic Panel [phone]) Est. Patient Level IV (99214)Future Orders: T-CD4SP ([facility]) (CD4SP) ... 09/28/2009 T-HIV Viral Load [phone]) ... 09/28/2009 T-CBC w/Diff ([id number]) ... 09/28/2009 T-Comprehensive Metabolic Panel [phone]) ... 09/28/2009  Orders: T-CD4SP ([facility]) (CD4SP) T-HIV Viral Load [phone]) T-Comprehensive Metabolic Panel [id number]) T-CBC w/Diff ([id number])  Problem # 2:  SORE THROAT (ICD-462)  no evidence for bacterial pharyngitis, areas she is concerned about in mouth are of uncertain clnical signficance, Will need to follow. I dont htink these are HPV.  Orders: Est. Patient Level IV (06237)  Problem # 3:  RHINITIS (ICD-472.0) Assessment: New  Dont think she has evidencef or bacterial process here, could be allergic vs viral URI  Orders: Est. Patient Level IV (62831)  Problem # 4:  HYPERLIPIDEMIA (ICD-272.4) Lipids a bit worse on norvir which is not unexpected. she is alreday on high dose crestor and tricor. IF she could prove herself to be a compliant pt with ARV I could consider changing her to a more lipid friendly regimen, suhc as etravirine, truvada and raltegravir. However 2/3 drugs are  salvage drugs and I AM NOT interested in burning through those drugs at this pt in time. Due to her proven poor adherence in past year she needs a protease inhibitor based regimen  which will be more forgiving of noncompliance with unfortunate risk of worsening her lipids. Removing DM from her diagnoses reduces her LDL goal to 150 Her updated medication list for this problem includes:    Tricor 145 Mg Tabs (Fenofibrate) .Marland Kitchen... Take 1 tabelt by mouth once daily    Crestor 40 Mg Tabs (Rosuvastatin calcium) .Marland Kitchen... Take 1 tablet by mouth once a day (this replaces lipitor)  Orders: [facility] ([facility]) T-Hgb A1C (in-house) ([id number]) Est. Patient Level IV (73710)  Problem # 5:  MIGRAINE HEADACHE (ICD-346.90) defer to pcp  Will remove this from her problem list since she had a normal a1c without drugs Orders: Est. Patient Level IV (62694)  Labs Reviewed: Creat: 0.73 (07/07/2009)    Reviewed HgBA1c results: 5.1 (08/17/2009)  5.2 (07/06/2009)  Her updated medication list for this problem includes:    Zomig 5 Mg Tabs (Zolmitriptan) ..... One tab by mouth as needed    Percocet 5-325 Mg Tabs (Oxycodone-acetaminophen) .Marland Kitchen... Take 1 tablet by mouth every 6 hours as needed  Problem # 6:  NAUSEA AND VOMITING (ICD-787.01) better with phenergan Orders: Est. Patient Level IV (85462)  Other Orders: Pneumococcal Vaccine (70350) Admin 1st Vaccine (09381) T- Capillary Blood Glucose (82993)  Lipid Assessment/Plan:      Based on NCEP/ATP III, the patient's risk factor category is "2 or more risk factors and a calculated 10 year CAD risk of < 20%".  The patient's lipid goals are as follows: Total cholesterol goal is 200; LDL cholesterol goal is 100; HDL cholesterol goal is 40; Triglyceride goal is 150.    Patient Instructions: 1)  rtc to see Daiva Eves in 2 months   Process Orders Check Orders Results:     [facility]: ABN not required for this insurance Tests Sent for  requisitioning (August 18, 2009 3:40  PM):     08/17/2009: [facility] -- T-HIV Viral Load [phone] (signed)     08/17/2009: [facility] -- T-CBC w/Diff [[id number]] (signed)     09/28/2009: [facility] -- T-HIV Viral Load [phone] (signed)     09/28/2009: [facility] -- T-CBC w/Diff [[id number]] (signed)     09/28/2009: [facility] -- T-Comprehensive Metabolic Panel [[id number]] (signed)     08/17/2009: [facility] -- T-Comprehensive Metabolic Panel [phone] (signed)    Immunizations Administered:  Pneumonia Vaccine:    Vaccine Type: Pneumovax    Site: left deltoid    Mfr: Merck    Dose: 0.5 ml    Route: IM    Given by: Starleen Arms CMA    Exp. Date: 10/22/2010    Lot #: [id number]    VIS given: 01/30/96 version given August 17, 2009.         Medication Adherence: 08/17/2009   Adherence to medications reviewed with patient. Counseling to provide adequate adherence provided    Prevention For Positives: 08/17/2009   Safe sex practices discussed with patient. Condoms offered.                               Process Orders Check Orders Results:     [facility]: ABN not required for this insurance Tests Sent for requisitioning (August 18, 2009 3:40 PM):     08/17/2009: [facility] -- T-HIV Viral Load [id number] (signed)     08/17/2009: [facility] -- T-CBC w/Diff [[id number]] (signed)     09/28/2009: [facility] -- T-HIV Viral Load [phone] (signed)     09/28/2009: [facility] -- T-CBC w/Diff [[id number]] (signed)     09/28/2009: [facility] -- T-Comprehensive Metabolic Panel [[id number]] (signed)     08/17/2009: [facility] -- T-Comprehensive Metabolic Panel [id number] (signed)   Laboratory Results   Blood Tests   Date/Time  Received: August 17, 2009 4:42 PM Date/Time Reported: Alric Quan  August 17, 2009 4:42 PM   HGBA1C: 5.1%   (Normal Range: Non-Diabetic - 3-6%   Control Diabetic - 6-8%) CBG Random:: 109mg /dL

## 2010-08-05 NOTE — Progress Notes (Signed)
Summary: refill/ hla  Phone Note Refill Request Message from:  Fax from Pharmacy on January 08, 2010 3:45 PM  Refills Requested: Medication #1:  FLUCONAZOLE 100 MG TABS Take one pill daily as needed for yeast infection/thrush   Last Refilled: 6/4  Medication #2:  clindamycin 150mg  1 capsule by mouth 2 times daily   Last Refilled: 6/4 Initial call taken by: Marin Roberts RN,  January 08, 2010 3:46 PM  Follow-up for Phone Call        It appears she must be taking these every day. I would like for her to be evaluated. Not sure why she needs Clindamycin. Will deny both until she can be seen. Ok for her to see any provider. Follow-up by: [name]  DO,  January 15, 2010 2:26 PM  Additional Follow-up for Phone Call Additional follow up Details #1::        i see she is here Additional Follow-up by: Marin Roberts RN,  January 19, 2010 10:40 AM

## 2010-08-05 NOTE — Medication Information (Signed)
Summary: RX HISTORY REPORT  RX HISTORY REPORT   Imported By: Margie Billet 07/23/2010 13:47:04  _____________________________________________________________________  External Attachment:    Type:   Image     Comment:   External Document

## 2010-08-05 NOTE — Progress Notes (Signed)
Summary: Refferral Form -Physical Therapy  Refferral Form -Physical Therapy   Imported By: Marily Memos 07/22/2009 11:19:26  _____________________________________________________________________  External Attachment:    Type:   Image     Comment:   External Document

## 2010-08-05 NOTE — Progress Notes (Signed)
Summary: Prior Authorization denial- [facility]  Phone Note Outgoing Call   Call placed by: Angelina Ok RN,  September 16, 2009 9:49 AM Call placed to: Patient Summary of Call: Call for Prior Authorization on [facility].  Denied pt will need to try [facility] first. Angelina Ok RN  September 16, 2009 9:50 AM  Initial call taken by: Angelina Ok RN,  September 16, 2009 9:50 AM  Follow-up for Phone Call        Will try this for 10 days and see if she improves.  Follow-up by: [name]  DO,  September 18, 2009 11:40 AM

## 2010-08-05 NOTE — Miscellaneous (Signed)
Summary: [facility]  [facility]   Imported By: Marily Memos 09/24/2009 09:01:04  _____________________________________________________________________  External Attachment:    Type:   Image     Comment:   External Document

## 2010-08-05 NOTE — Assessment & Plan Note (Signed)
Summary: 85month f/u/est/vs   Vital Signs:  Patient profile:   45 year old female Menstrual status:  irregular Height:      66 inches (167.64 cm) Weight:      222.04 pounds (100.93 kg) Temp:     97.6 degrees F (36.44 degrees C) oral Pulse rate:   72 / minute BP sitting:   120 / 77  (right arm)  Vitals Entered By: Angelina Ok RN (Nov 02, 2009 8:37 AM) CC: Headache Is Patient Diabetic? No Pain Assessment Patient in pain? yes     Location: back Intensity: 5 Type: aching Onset of pain  Constant Nutritional Status BMI of > 30 = obese CBG Result 106  Have you ever been in a relationship where you felt threatened, hurt or afraid?No   Does patient need assistance? Functional Status Self care Ambulation Normal Comments Check up.  Refils.   Primary Care Provider:  [name]  DO  CC:  Headache.  History of Present Illness: [name] comes in today fopr routine follow-up. She has multiple ongoing complaints and issues which include:  1. recurrent "boils" on her abdominal pannus, she admits to poking them with needles and squeezing them, she is asking for keflex which she says clears them up immediately. she reports the get worse in the summer. No itching or pain at teh site of the lesions. they do not spread for become indurated. The usually heal leaving a bruised discolored area.  2. Persistent Vaginal discharge and itching- has a history of recurrent yeast infections and BV. Intermittently sexually active.  3. Superficial flank pain from Post herpetic neuralgia. persistent and chronic.  4. Intermittent nausea related to multiple medications and possibly her HAART.  5. Upset because she cannot get Zomig for her migraine HA- insurance prior Berkley Harvey is pending.  6. Dysuria, and urinary frequency. new problem. No hematuria.  Depression History:      The patient denies a depressed mood most of the day and a diminished interest in her usual daily activities.        The patient  denies that she feels like life is not worth living, denies that she wishes that she were dead, and denies that she has thought about ending her life.         Preventive Screening-Counseling & Management  Alcohol-Tobacco     Alcohol drinks/day: 0     Smoking Status: never  Current Medications (verified): 1)  Nexium 40 Mg Cpdr (Esomeprazole Magnesium) .... Take 1 Tablet By Mouth Two Times A Day 2)  Zomig 5 Mg Tabs (Zolmitriptan) .... Take 1 Tablet By Mouth Once A Day As Needed Migraine 3)  Percocet 5-325 Mg Tabs (Oxycodone-Acetaminophen) .... Take 1 Tablet By Mouth Every 6 Hours As Needed 4)  Alprazolam 2 Mg Tabs (Alprazolam) .... Take 1 Tablet By Mouth Three Times A Day 5)  Tricor 145 Mg  Tabs (Fenofibrate) .... Take 1 Tabelt By Mouth Once Daily 6)  Transderm-Scop  Pt72 (Scopolamine Base Pt72) .... Apply Every 48 Hours As Needed 7)  Soma 250 Mg Tabs (Carisoprodol) .... One Tab By Mouth Three Times A Day Prn 8)  Gyne-Lotrimin 1 %  Crea (Clotrimazole) .... One Application As Directed. Pharmacy May Substitute Equivalent 9)  Voltaren 1 %  Gel (Diclofenac Sodium) .... Use Twice Daily, As Needed For Pain. 10)  Terazol 7 0.4 % Crea (Terconazole) .... Use As Directed 11)  Vaniqa 13.9 % Crea (Eflornithine Hcl) .... Apply As Directed 12)  Fluconazole 100 Mg  Tabs (Fluconazole) .... Take One Pill Daily As Needed For Yeast Infection/thrush 13)  Topamax 50 Mg Tabs (Topiramate) .... Start One Pill Dailly For One Week Then Increase To Twice Daily Until Your Next Visit 14)  Prezista 400 Mg Tabs (Darunavir Ethanolate) .... Take Two Tablets Once A Day With Norvir and Viread 15)  Viread 300 Mg Tabs (Tenofovir Disoproxil Fumarate) .... Take Once A Day With Two Prezistas and One Norvir 16)  Combivir 150-300 Mg Tabs (Lamivudine-Zidovudine) .... Take 1 Tablet By Mouth Two Times A Day 17)  Norvir 100 Mg Tabs (Ritonavir) .... Take One Tablet With The Two Prezistas and One Viread 18)  Crestor 40 Mg Tabs  (Rosuvastatin Calcium) .... Take 1 Tablet By Mouth Once A Day (This Replaces Lipitor) 19)  Promethazine Hcl 50 Mg Tabs (Promethazine Hcl) .... Take One Half To One Whole Tablet Up To Four Times Daily As Needed Nausea 20)  Hibiclens 4 % Liqd (Chlorhexidine Gluconate) .... Use As Directed Two Times A Day  Allergies (verified): 1)  ! Darvocet 2)  ! Neurontin 3)  ! Valium 4)  ! * Zofran 5)  ! Pcn 6)  ! Ultram 7)  ! Depakote  Review of Systems      See HPI  Physical Exam  General:  Well-developed,well-nourished,in no acute distress; alert,appropriate and cooperative throughout examination Lungs:  normal respiratory effort, no accessory muscle use, normal breath sounds, no crackles, and no wheezes.   Heart:  normal rate, regular rhythm, no murmur, no gallop, and no rub.   Abdomen:  soft, non-tender, and normal bowel sounds.   Msk:  no joint swelling, no joint warmth, and no redness over joints.   Skin:  Multiple bluish-red, round areas on her lower abdomen, slightly raised but not iondurated or significantly inflammed. she has one open draining boil in her right groin area that is small and slightly read- no fluctuent mass or sighns of serious infection-appears superficial.  Psych:  Tangential, sometimes pressured speech, but appropriate and makes good eye contact.   Impression & Recommendations:  Problem # 1:  MACROCYTIC ANEMIA (ICD-281.9) Likely because of her NRTIs but I will check her B12 and repeat her CBC. Platelets are improving. Orders: T-Vitamin B12 [phone]) T-Magnesium [phone]) T-CBC w/Diff ([id number])  Problem # 2:  BACK PAIN (ICD-724.5) Continue chronic opiates.  Her updated medication list for this problem includes:    Percocet 5-325 Mg Tabs (Oxycodone-acetaminophen) .Marland Kitchen... Take 1 tablet by mouth every 6 hours as needed    Soma 250 Mg Tabs (Carisoprodol) ..... One tab by mouth three times a day prn  Problem # 3:  BOILS, RECURRENT (ICD-680.9) I have  reluctantly agreed to a course of keflex for her skin infection. I recommended claensing with hibiclens and using a hypallergenic lotion. I dont think these are severe enough to supress. I did advise her to not manipulate the boils and have them evaluated if they become large or painful- she may need a culture/biopsy if they continue although I am fairly confident these represent furuncles and not some other worrisome dermatologic process.  Problem # 4:  MIGRAINE HEADACHE (ICD-346.90) Maxalt not working. Minimal relief. Has had relief with Zomig in past. Her updated medication list for this problem includes:    Zomig 5 Mg Tabs (Zolmitriptan) .Marland Kitchen... Take 1 tablet by mouth once a day as needed migraine    Percocet 5-325 Mg Tabs (Oxycodone-acetaminophen) .Marland Kitchen... Take 1 tablet by mouth every 6 hours as needed  Problem # 5:  HYPERLIPIDEMIA (  ICD-272.4) her lipds are relatively good. She has a total RR of 2% on medication. Will follow.   Her updated medication list for this problem includes:    Tricor 145 Mg Tabs (Fenofibrate) .Marland Kitchen... Take 1 tabelt by mouth once daily    Crestor 40 Mg Tabs (Rosuvastatin calcium) .Marland Kitchen... Take 1 tablet by mouth once a day (this replaces lipitor)  Labs Reviewed: SGOT: 12 (09/23/2009)   SGPT: 9 (09/23/2009)  Lipid Goals: Chol Goal: 200 (08/17/2009)   HDL Goal: 40 (08/17/2009)   LDL Goal: 100 (08/17/2009)   TG Goal: 150 (08/17/2009)  Prior 10 Yr Risk Heart Disease: 2 % (08/17/2009)   HDL:35 (09/23/2009), 38 (07/06/2009)  LDL:123 (09/23/2009), 155 (07/06/2009)  Chol:201 (09/23/2009), 264 (07/06/2009)  Trig:217 (09/23/2009), 355 (07/06/2009)  Problem # 6:  ANXIETY (ICD-300.00) Has been on chronic benzodiazapines for 10+ years with tolerance present. Will not make changes at this point- could consider weaning, but I am certain these are helpful in controlling her widely fluctuating anxiety level.  Her updated medication list for this problem includes:    Alprazolam 2 Mg Tabs  (Alprazolam) .Marland Kitchen... Take 1 tablet by mouth three times a day  Problem # 7:  DYSURIA (ICD-788.1) Will check a UA today- I suspect her symptoms are related to vaginal irritation. Orders: T-Culture, Urine ([id number]) T-Urinalysis ([id number])  Complete Medication List: 1)  Nexium 40 Mg Cpdr (Esomeprazole magnesium) .... Take 1 tablet by mouth two times a day 2)  Zomig 5 Mg Tabs (Zolmitriptan) .... Take 1 tablet by mouth once a day as needed migraine 3)  Percocet 5-325 Mg Tabs (Oxycodone-acetaminophen) .... Take 1 tablet by mouth every 6 hours as needed 4)  Alprazolam 2 Mg Tabs (Alprazolam) .... Take 1 tablet by mouth three times a day 5)  Tricor 145 Mg Tabs (Fenofibrate) .... Take 1 tabelt by mouth once daily 6)  Transderm-scop Pt72 (Scopolamine base pt72) .... Apply every 48 hours as needed 7)  Soma 250 Mg Tabs (Carisoprodol) .... One tab by mouth three times a day prn 8)  Gyne-lotrimin 1 % Crea (Clotrimazole) .... One application as directed. pharmacy may substitute equivalent 9)  Voltaren 1 % Gel (Diclofenac sodium) .... Use twice daily, as needed for pain. 10)  Terazol 7 0.4 % Crea (Terconazole) .... Use as directed 11)  Vaniqa 13.9 % Crea (Eflornithine hcl) .... Apply as directed 12)  Fluconazole 100 Mg Tabs (Fluconazole) .... Take one pill daily as needed for yeast infection/thrush 13)  Topamax 50 Mg Tabs (Topiramate) .... Start one pill dailly for one week then increase to twice daily until your next visit 14)  Prezista 400 Mg Tabs (Darunavir ethanolate) .... Take two tablets once a day with norvir and viread 15)  Viread 300 Mg Tabs (Tenofovir disoproxil fumarate) .... Take once a day with two prezistas and one norvir 16)  Combivir 150-300 Mg Tabs (Lamivudine-zidovudine) .... Take 1 tablet by mouth two times a day 17)  Norvir 100 Mg Tabs (Ritonavir) .... Take one tablet with the two prezistas and one viread 18)  Crestor 40 Mg Tabs (Rosuvastatin calcium) .... Take 1 tablet by mouth  once a day (this replaces lipitor) 19)  Promethazine Hcl 50 Mg Tabs (Promethazine hcl) .... Take one half to one whole tablet up to four times daily as needed nausea 20)  Hibiclens 4 % Liqd (Chlorhexidine gluconate) .... Use as directed two times a day  Other Orders: Capillary Blood Glucose/CBG ([id number]) T-Comprehensive Metabolic Panel ([id number])  Patient Instructions: 1)  F/U  1-2 months with [facility] Prescriptions: KEFLEX 250 MG CAPS (CEPHALEXIN) Take 1 tablet by mouth two times a day  #14 x 0   Entered and Authorized by:   [name]  DO   Signed by:   [name]  DO on 11/04/2009   Method used:   Electronically to        [facility] (retail)       [phone] [address]       [place], [place]  [id number]       Ph: [phone]       Fax: [phone]   RxID:   [id number] ZOMIG 5 MG TABS (ZOLMITRIPTAN) Take 1 tablet by mouth once a day as needed migraine  #30 x 3   Entered and Authorized by:   [name]  DO   Signed by:   [name]  DO on 11/02/2009   Method used:   Electronically to        [facility] (retail)       [phone]       [place], [place]  [id number]       Ph: [phone]       Fax: [phone]   RxID:   [id number] PERCOCET 5-325 MG TABS (OXYCODONE-ACETAMINOPHEN) Take 1 tablet by mouth every 6 hours as needed  #120 x 0   Entered and Authorized by:   [name]  DO   Signed by:   [name]  DO on 11/02/2009   Method used:   Print then Give to Patient   RxID:   [id number] PERCOCET 5-325 MG TABS (OXYCODONE-ACETAMINOPHEN) Take 1 tablet by mouth every 6 hours as needed  #120 x 0   Entered and Authorized by:   [name]  DO   Signed by:   [name]  DO on 11/02/2009   Method used:   Print then Give to Patient   RxID:   [id number] HIBICLENS 4 % LIQD (CHLORHEXIDINE GLUCONATE) Use as directed two times a day  #1 bottle x 3   Entered and Authorized by:    [name]  DO   Signed by:   [name]  DO on 11/02/2009   Method used:   Print then Give to Patient   RxID:   [id number]   Prevention & Chronic Care Immunizations   Influenza vaccine: Fluvax 3+  (04/13/2009)    Tetanus booster: 11/18/2008: Tdap    Pneumococcal vaccine: Pneumovax  (08/17/2009)  Other Screening   Pap smear: Not documented    Mammogram: ASSESSMENT: Negative - BI-RADS 1^MM DIGITAL SCREENING  (07/10/2009)   Smoking status: never  (11/02/2009)  Lipids   Total Cholesterol: 201  (09/23/2009)   LDL: 123  (09/23/2009)   LDL Direct: Not documented   HDL: 35  (09/23/2009)   Triglycerides: 217  (09/23/2009)    SGOT (AST): 12  (09/23/2009)   SGPT (ALT): 9  (09/23/2009) CMP ordered    Alkaline phosphatase: 50  (09/23/2009)   Total bilirubin: 0.5  (09/23/2009)  Self-Management Support :   Personal Goals (by the next clinic visit) :      Personal LDL goal: 100  (06/17/2009)    Patient will work on the following items until the next clinic visit to reach self-care goals:     Medications and monitoring: take my medicines every day, bring all of my medications to every visit, examine  my feet every day  (11/02/2009)     Eating: drink diet soda or water instead of juice or soda, eat more vegetables, eat foods that are low in salt, eat baked foods instead of fried foods, eat fruit for snacks and desserts, limit or avoid alcohol  (11/02/2009)     Activity: take a 30 minute walk every day  (11/02/2009)    Lipid self-management support: Written self-care plan, Education handout, Pre-printed educational material  (11/02/2009)   Lipid self-care plan printed.   Lipid education handout printed  Process Orders Check Orders Results:     [facility]: ABN not required for this insurance Tests Sent for requisitioning (Nov 10, 2009 11:54 AM):     11/02/2009: [facility] -- T-Culture, Urine [[id number]] (signed)     11/02/2009: [facility] -- T-Comprehensive Metabolic Panel [[id number]] (signed)     11/02/2009: [facility] -- T-Vitamin B12 [id number] (signed)     11/02/2009: [facility] -- T-Magnesium [[id number]] (signed)     11/02/2009: [facility] -- T-Urinalysis [[id number]] (signed)     11/02/2009: [facility] -- T-CBC w/Diff [[id number]] (signed)     Vital Signs:  Patient profile:   45 year old female Menstrual status:  irregular Height:      66 inches (167.64 cm) Weight:      222.04 pounds (100.93 kg) Temp:     97.6 degrees F (36.44 degrees C) oral Pulse rate:   72 / minute BP sitting:   120 / 77  (right arm)  Vitals Entered By: Angelina Ok RN (Nov 02, 2009 8:37 AM)

## 2010-08-05 NOTE — Assessment & Plan Note (Signed)
Summary: F/U APPT...LSW.    History of Present Illness Visit Type: Follow-up Visit Primary GI MD: Sheryn Bison MD FACP FAGA Primary Provider: [name]  DO Requesting Provider: na Chief Complaint: F/u visit for constipation. Pt states that she is getting better and denies any GI complaints  History of Present Illness:   45 year old Caucasian female with multiple medical problems all 20 different medications for chronic pain syndrome and chronic HIV infection without active AIDS.She is a long-term patient of Dr. Corinda Gubler and has chronic GI motility issues with severe constipation and gastroparesis. She returns today for followup and is doing better with her constipation on Kristalose and Amitiza 8 micrograms twice a day but continues with mild nausea, gas, bloating, and worsening acid reflux despite being on Nexium 40 mg a day. She denies any specific hepatobiliary complaints, dysphasia, or rectal bleeding.   GI Review of Systems      Denies abdominal pain, acid reflux, belching, bloating, chest pain, dysphagia with liquids, dysphagia with solids, heartburn, loss of appetite, nausea, vomiting, vomiting blood, weight loss, and  weight gain.        Denies anal fissure, black tarry stools, change in bowel habit, constipation, diarrhea, diverticulosis, fecal incontinence, heme positive stool, hemorrhoids, irritable bowel syndrome, jaundice, light color stool, liver problems, rectal bleeding, and  rectal pain.    Current Medications (verified): 1)  Nexium 40 Mg Cpdr (Esomeprazole Magnesium) .... Take 1 Tablet By Mouth Two Times A Day 2)  Zomig 5 Mg Tabs (Zolmitriptan) .... Take 1 Tablet By Mouth Once A Day As Needed Migraine 3)  Percocet 5-325 Mg Tabs (Oxycodone-Acetaminophen) .... Take 1 Tablet By Mouth Every 6 Hours As Needed 4)  Alprazolam 2 Mg Tabs (Alprazolam) .... Take 1 Tablet By Mouth Three Times A Day 5)  Tricor 145 Mg  Tabs (Fenofibrate) .... Take 1 Tabelt By Mouth Once Daily 6)   Transderm-Scop  Pt72 (Scopolamine Base Pt72) .... Apply Every 48 Hours As Needed 7)  Soma 250 Mg Tabs (Carisoprodol) .... One Tab By Mouth Three Times A Day Prn 8)  Gyne-Lotrimin 1 %  Crea (Clotrimazole) .... One Application As Directed. Pharmacy May Substitute Equivalent 9)  Voltaren 1 %  Gel (Diclofenac Sodium) .... Use Twice Daily, As Needed For Pain. 10)  Terazol 7 0.4 % Crea (Terconazole) .... Use As Directed 11)  Vaniqa 13.9 % Crea (Eflornithine Hcl) .... Apply As Directed 12)  Fluconazole 100 Mg Tabs (Fluconazole) .... Take One Pill Daily As Needed For Yeast Infection/thrush 13)  Topamax 50 Mg Tabs (Topiramate) .... Start One Pill Dailly For One Week Then Increase To Twice Daily Until Your Next Visit 14)  Prezista 400 Mg Tabs (Darunavir Ethanolate) .... Take Two Tablets Once A Day With Norvir and Viread 15)  Viread 300 Mg Tabs (Tenofovir Disoproxil Fumarate) .... Take Once A Day With Two Prezistas and One Norvir 16)  Combivir 150-300 Mg Tabs (Lamivudine-Zidovudine) .... Take 1 Tablet By Mouth Two Times A Day 17)  Norvir 100 Mg Tabs (Ritonavir) .... Take One Tablet With The Two Prezistas and One Viread 18)  Crestor 40 Mg Tabs (Rosuvastatin Calcium) .... Take 1 Tablet By Mouth Once A Day (This Replaces Lipitor) 19)  Promethazine Hcl 50 Mg Tabs (Promethazine Hcl) .... Take One Half To One Whole Tablet Up To Four Times Daily As Needed Nausea 20)  Hibiclens 4 % Liqd (Chlorhexidine Gluconate) .... Use As Directed Two Times A Day 21)  Amitiza 8 Mcg Caps (Lubiprostone) .... Two Times A Day  With Meals 22)  Kristalose 20 Gm  Pack (Lactulose) .... 20 Gm Q Hs  Allergies (verified): 1)  ! Darvocet 2)  ! Neurontin 3)  ! Valium 4)  ! * Zofran 5)  ! Pcn 6)  ! Ultram 7)  ! Depakote  Past History:  Past medical, surgical, family and social histories (including risk factors) reviewed for relevance to current acute and chronic problems.  Past Medical History: Reviewed history from 04/29/2008 and  no changes required. Hx Non-Hodgkins Lymphoma Stage IIA Recurrent Sinusitis History of Peptic ulcer disease-EGD 1/99-Dr. 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 Endoscopy [id number] per Dr. 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  Family History: Reviewed history from 04/29/2008 and no changes required. Mom - HTN, hx of TIA Dad - died at 79 yo of suicide-(GSW  when she was 15 yrs.) No FH of Colon Cancer:  Social History: Reviewed history from 03/24/2008 and no changes required. Single Lives alone No ETOH No Tobacco Abuse  Review of Systems  The patient denies allergy/sinus, anemia, anxiety-new, arthritis/joint pain, back pain, blood in urine, breast changes/lumps, change in vision, confusion, cough, coughing up blood, depression-new, fainting, fatigue, fever, headaches-new, hearing problems, heart murmur, heart rhythm changes, itching, menstrual pain, muscle pains/cramps, night sweats, nosebleeds, pregnancy symptoms, shortness of breath, skin rash, sleeping problems, sore throat, swelling of feet/legs, swollen lymph glands, thirst - excessive , urination - excessive , urination changes/pain, urine leakage, vision changes, and voice change.    Vital Signs:  Patient profile:   45 year old female Menstrual status:  irregular Height:      66 inches Weight:      220 pounds BMI:     35.64 BSA:     2.08 Pulse rate:   68 /  minute Pulse rhythm:   regular BP sitting:   120 / 74  (left arm) Cuff size:   regular  Vitals Entered By: Ok Anis CMA (December 08, 2009 11:58 AM)  Physical Exam  General:  Well developed, well nourished, no acute distress.healthy appearing and obese.   Head:  Normocephalic and atraumatic. Eyes:  PERRLA, no icterus.exam deferred to patient's ophthalmologist.   Psych:  Alert and cooperative. Normal mood and affect.   Impression & Recommendations:  Problem # 1:  CONSTIPATION (ICD-564.00) Assessment Improved Continue Amitiza 8 micrograms twice a day with daily Kristalose as tolerated. Apparently she is only using Percocet one to 2 times a day, and I versed her try to use this as infrequently as possible.Colonoscopy followup at her request.  Problem # 2:  GERD (ICD-530.81) Assessment: Improved Continue Nexium 40 mg a day and will add domperidone 10 mg t.i.d. before meals as tolerated with followup endoscopy at her request. This has not been accomplished many years. Because of her multiple medications, drug allergies, and chronic medical problems, I think endoscopic sedation can best be accomplished with propofol sedation a nurse anesthesia assistant.  Problem # 3:  GASTROPARESIS (ICD-536.3) Assessment: Improved Try additional prokinetic agents as tolerated. I also recommended to her because of her chronic pain syndromes to try acupuncture therapy. This of note that  the patient is on Voltaren gel which is a topical NSAID.  Problem # 4:  NON-HODGKIN'S LYMPHOMA, HX OF (ICD-V10.79) Assessment: Improved in remission.  Problem # 5:  PERIPHERAL NEUROPATHY (ICD-356.9) Assessment: Unchanged Related to prior chemotherapy---try acupuncture therapy.  Problem # 6:  HIV INFECTION (ICD-042) Assessment: Improved Continued use of antiviral medications as per Dr. [name].  Patient Instructions: 1)  Begin Domperidome three times a day.  Information is given on how to order this  medication. 2)  Call [facility] for an appointment.  [phone]. 3)  You will be scheduled for an endoscopy and a colonoscopy at [facility]. 4)  The medication list was reviewed and reconciled.  All changed / newly prescribed medications were explained.  A complete medication list was provided to the patient / caregiver. 5)  Copy sent to :Dr. [name].  6)  Please continue current medications.  7)  Constipation and Hemorrhoids brochure given.  8)  Colonoscopy and Flexible Sigmoidoscopy brochure given.  9)  Conscious Sedation brochure given.  10)  Upper Endoscopy brochure given.  Prescriptions: DOMEPERIDOME 10 MG 1 by mouth three times a day  #100 x 5   Entered by:   Ashok Cordia RN   Authorized by:   Mardella Layman MD [facility]   Signed by:   Ashok Cordia RN on 12/08/2009   Method used:   Print then Give to Patient   RxID:   [id number]   Appended Document: F/U APPT...LSW.    Clinical Lists Changes  Medications: Rx of AMITIZA 8 MCG CAPS (LUBIPROSTONE) two times a day with meals;  #60 x 11;  Signed;  Entered by: Ashok Cordia RN;  Authorized by: Mardella Layman MD [facility];  Method used: Electronically to [facility] [phone] [address], [place], [place], [place]  [id number], Ph: [phone], Fax: [phone]    Prescriptions: AMITIZA 8 MCG CAPS (LUBIPROSTONE) two times a day with meals  #60 x 11   Entered by:   Ashok Cordia RN   Authorized by:   Mardella Layman MD [facility]   Signed by:   Ashok Cordia RN on 12/08/2009   Method used:   Electronically to        [facility] (retail)       [address]       [place], [place]  [id number]       Ph: [phone]       Fax: [phone]   RxID:   [id number]    Appended Document: F/U APPT...LSW.    Clinical Lists Changes  Medications: Added new medication of MOVIPREP 100 GM  SOLR (PEG-KCL-NACL-NASULF-NA ASC-C) As per prep  instructions. - Signed Rx of MOVIPREP 100 GM  SOLR (PEG-KCL-NACL-NASULF-NA ASC-C) As per prep instructions.;  #1 x 0;  Signed;  Entered by: Ashok Cordia RN;  Authorized by: Mardella Layman MD [facility];  Method used: Electronically to [facility] [phone] [address], [place], [place], [place]  [id number], Ph: [phone], Fax: [phone] Orders: Added new Test order of [facility] (Col/End [place]) - Signed    Prescriptions: MOVIPREP 100 GM  SOLR (PEG-KCL-NACL-NASULF-NA ASC-C) As per prep instructions.  #1 x 0   Entered by:   Ashok Cordia RN   Authorized by:   Mardella Layman MD [facility]   Signed by:   Ashok Cordia RN on 12/08/2009   Method used:   Electronically to  [facility] (retail)       [address]       [place], [place]  [address]       Ph: [phone]       Fax: [phone]   RxID:   [id number]    Appended Document: F/U APPT...LSW. Pt req rx be sent in for movi prep.  Rx resent as requested.   Clinical Lists Changes  Medications: Added new medication of MOVIPREP 100 GM  SOLR (PEG-KCL-NACL-NASULF-NA ASC-C) As per prep instructions. - Signed Rx of MOVIPREP 100 GM  SOLR (PEG-KCL-NACL-NASULF-NA ASC-C) As per prep instructions.;  #1 x 0;  Signed;  Entered by: Ashok Cordia RN;  Authorized by: Mardella Layman MD [facility];  Method used: Electronically to [facility] [phone] [address], [place], [place], [place]  [address], Ph: [phone], Fax: [phone]    Prescriptions: MOVIPREP 100 GM  SOLR (PEG-KCL-NACL-NASULF-NA ASC-C) As per prep instructions.  #1 x 0   Entered by:   Ashok Cordia RN   Authorized by:   Mardella Layman MD [facility]   Signed by:   Ashok Cordia RN on 12/09/2009   Method used:   Electronically to        [facility] (retail)       [address]       [place], [place]  [address]       Ph: [phone]       Fax:  [phone]   RxID:   [id number]

## 2010-08-05 NOTE — Miscellaneous (Signed)
Summary: RW Update  Clinical Lists Changes  Observations: Added new observation of INCOMESOURCE: unknown (07/26/2010 12:13) Added new observation of HOUSEINCOME: 0  (07/26/2010 12:13) Added new observation of #CHILD<18 IN: Unknown  (07/26/2010 12:13) Added new observation of FAMILYSIZE: 0  (07/26/2010 12:13) Added new observation of HOUSING: Unknown  (07/26/2010 12:13) Added new observation of YEARLYEXPEN: 0  (07/26/2010 12:13) Added new observation of LATINO/HISP: No  (07/26/2010 12:13)

## 2010-08-05 NOTE — Consult Note (Signed)
Summary: [facility]   Imported By: Margie Billet 07/05/2010 13:51:34  _____________________________________________________________________  External Attachment:    Type:   Image     Comment:   External Document

## 2010-08-05 NOTE — Assessment & Plan Note (Signed)
Summary: U/S ARM,F/U [facility]   Vital Signs:  Patient profile:   45 year old female Menstrual status:  irregular Height:      66 inches Weight:      224 pounds Pulse rate:   93 / minute BP sitting:   93 / 66  (right arm)  Vitals Entered By: Rochele Pages RN (April 15, 2010 10:12 AM) CC: f/u knee pain and rt arm lipoma u/s   Referring Gaylon Melchor:  na Primary Syriah Delisi:  [name]  DO  CC:  f/u knee pain and rt arm lipoma u/s.  History of Present Illness: Patient returns to clinic today for follow up on bilateral knee pain which she reports is 10% improved, and  u/s of knot on upper rt arm.   She states she feels some knee pain all most all the time, and it gets worse with extensive walking, and going up and down stairs.  She has been wearing cho pats with walking.  Takes meloxicam for the pain and this is helpful.  She feels that the pain has not been as bad lately, but she has not been working out in the last month due to time conflicts.   She reports bilateral leg spasms intermittently also for the past 3-4 weeks- the sensation starts in her feet and radiates up through her hamstrings.   Knot in rt upper arm still present, throbs most of the time. Felt pop in rear of arm two weeks ago.  Allergies: 1)  ! Darvocet 2)  ! Neurontin 3)  ! Valium 4)  ! * Zofran 5)  ! Pcn 6)  ! Ultram 7)  ! Depakote  Physical Exam  General:  Well-developed,well-nourished,in no acute distress; alert,appropriate and cooperative throughout examination Msk:  Arms: R arm with one knot below deltoid on lateral upper arm. Decreased in size and definition since last visit. Some tenderness to palpation. Also knot on back of upper arm in triceps.  R decreased definition of triceps compared to L.  Knees: 4+/5 strength with knee extension, isolation of VMO. Some pain with exam. No swelling, deformity with symmetric knees bilaterally. Some pain along joint line, L>R and with patellar depression.  U/S R arm:  Area of increased echogenicity in belly of R superficial triceps consistent with partial tear of muscle.  Increased thickness of adipose tissue corresponding to palpable knot on outside of arm.   Impression & Recommendations:  Problem # 1:  KNEE PAIN, BILATERAL (ICD-719.46)  Hx of patellofemoral pain in knees. X-rays in past have shown only mild arthritic changes given prior obesity. Patient has now lost over 100 pounds. Remains week in quadriceps. - Encouraged to continue doing exercises, especially recumbent bicycle, to improve strength and reduce pain. Will continue to use patellar bands. - Refilled meloxicam. Started on amitryptiline for muscle spasm/nerve spasm, likely from spondylolisthesis given distal to proximal tingling and pain. Her updated medication list for this problem includes:    Percocet 5-325 Mg Tabs (Oxycodone-acetaminophen) .Marland Kitchen... Take 1 tablet by mouth every 6 hours as needed    Soma 250 Mg Tabs (Carisoprodol) ..... One tab by mouth three times a day prn    Meloxicam 15 Mg Tabs (Meloxicam) .Marland Kitchen... 1 tab by mouth daily with food x 14 days, then as needed for pain  reck in 3 mos p addtion of meds and exercise  Orders: [place] LIMITED ([id number])  Problem # 2:  ARM PAIN, RIGHT (ICD-729.5)  U/s and physical exam concerning for partial tear of triceps on  R. Patient will begin light triceps extensions with 1-3 pounds as needed to rehab muscle. - Follow up in 4-6 weeks to check on triceps progress. - Area of adiposity decreased since last visit. Currently ill-defined borders and small size, so no intervention necessary.  Orders: [place] LIMITED ([id number])  Problem # 3:  LIPOMAS, MULTIPLE (ICD-214.9)  Orders: [place] LIMITED ([id number])   would follow unless changing in size  Complete Medication List: 1)  Nexium 40 Mg Cpdr (Esomeprazole magnesium) .... Take 1 tablet by mouth two times a day 2)  Zomig 5 Mg Tabs (Zolmitriptan) .... Take 1 tablet by mouth once a day as needed migraine 3)   Percocet 5-325 Mg Tabs (Oxycodone-acetaminophen) .... Take 1 tablet by mouth every 6 hours as needed 4)  Alprazolam 2 Mg Tabs (Alprazolam) .... Take 1 tablet by mouth three times a day 5)  Tricor 145 Mg Tabs (Fenofibrate) .... Take 1 tabelt by mouth once daily 6)  Transderm-scop Pt72 (Scopolamine base pt72) .... Apply every 48 hours as needed 7)  Soma 250 Mg Tabs (Carisoprodol) .... One tab by mouth three times a day prn 8)  Gyne-lotrimin 1 % Crea (Clotrimazole) .... One application as directed. pharmacy may substitute equivalent 9)  Voltaren 1 % Gel (Diclofenac sodium) .... Use twice daily, as needed for pain. 10)  Terazol 7 0.4 % Crea (Terconazole) .... Use as directed 11)  Vaniqa 13.9 % Crea (Eflornithine hcl) .... Apply as directed 12)  Fluconazole 100 Mg Tabs (Fluconazole) .... Take one pill daily as needed for yeast infection/thrush 13)  Topamax 50 Mg Tabs (Topiramate) .... Start one pill dailly for one week then increase to twice daily until your next visit 14)  Prezista 400 Mg Tabs (Darunavir ethanolate) .... Take two tablets once a day with norvir and viread 15)  Viread 300 Mg Tabs (Tenofovir disoproxil fumarate) .... Take once a day with two prezistas and one norvir 16)  Combivir 150-300 Mg Tabs (Lamivudine-zidovudine) .... Take 1 tablet by mouth two times a day 17)  Norvir 100 Mg Tabs (Ritonavir) .... Take one tablet with the two prezistas and one viread 18)  Crestor 40 Mg Tabs (Rosuvastatin calcium) .... Take 1 tablet by mouth once a day (this replaces lipitor) 19)  Promethazine Hcl 50 Mg Tabs (Promethazine hcl) .... Take one half to one whole tablet up to four times daily as needed nausea 20)  Hibiclens 4 % Liqd (Chlorhexidine gluconate) .... Use as directed two times a day 21)  Amitiza 8 Mcg Caps (Lubiprostone) .... Two times a day with meals 22)  Kristalose 20 Gm Pack (Lactulose) .... 20 gm q hs 23)  Domeperidome 10 Mg  .Marland KitchenMarland Kitchen. 1 by mouth three times a day 24)  Meloxicam 15 Mg Tabs  (Meloxicam) .Marland Kitchen.. 1 tab by mouth daily with food x 14 days, then as needed for pain 25)  Cyanocobalamin 1000 Mcg/ml Soln (Cyanocobalamin) .... Administer 1.0 ml intramuscularly monthly 26)  Senokot Xtra 17.2 Mg Tabs (Sennosides) .... Take 1-2 tablet by mouth as needed for constipation 27)  Amitriptyline Hcl 25 Mg Tabs (Amitriptyline hcl) .Marland Kitchen.. 1 by mouth qhs Prescriptions: MELOXICAM 15 MG TABS (MELOXICAM) 1 tab by mouth daily with food x 14 days, then as needed for pain  #30 x 2   Entered by:   Rochele Pages RN   Authorized by:   Enid Baas MD   Signed by:   Rochele Pages RN on 04/15/2010   Method used:   Electronically to  [facility] (retail)       [address]       [place], [place]  [address]       Ph: [phone]       Fax: [phone]   RxID:   [id number] AMITRIPTYLINE HCL 25 MG TABS (AMITRIPTYLINE HCL) 1 by mouth qhs  #30 x 3   Entered and Authorized by:   Enid Baas MD   Signed by:   Enid Baas MD on 04/15/2010   Method used:   Electronically to        [facility] (retail)       [phone] [address]       [place], [place]  [id number]       Ph: [phone]       Fax: [phone]   RxID:   [id number]

## 2010-08-05 NOTE — Progress Notes (Signed)
Summary: AMITIZA  Phone Note Call from Patient Call back at [facility] Phone [phone]   Summary of Call: Pt left vm on Triage for Primary care, c/o problems w/amitiza. Please call. Initial call taken by: Lamar Sprinkles, CMA,  Nov 20, 2009 2:53 PM  Follow-up for Phone Call        Lm for pt to call.   Lupita Leash Surface RN  Nov 20, 2009 2:55 PM  Pt reporting,  could not take [place].  It caused nausea and abd cramping.  She is taking the kristolose even though is causes gas and bloating.   Cont's to have problems with constipation.  Yesterday had small bm and had rectal bleeding.  Har return OV on June 7.  Any other suggestions?  see OV note from 11/17/09. Follow-up by: Ashok Cordia RN,  Nov 23, 2009 9:40 AM    Additional Follow-up for Phone Call Additional follow up Details #2::    DECREASE AMITIZA TO 8 MCG two times a day WITH MEALS Follow-up by: Mardella Layman MD Clementeen Graham,  Nov 23, 2009 9:58 AM  Additional Follow-up for Phone Call Additional follow up Details #3:: Details for Additional Follow-up Action Taken: Lm for pt to call.  Lupita Leash Surface RN  Nov 23, 2009 10:20 AM  Pt notified.  Samples of amitiza 8 micrograms left at front desk for pt.  Pt has return appt sch. Additional Follow-up by: Ashok Cordia RN,  Nov 24, 2009 1:47 PM  New/Updated Medications: AMITIZA 8 MCG CAPS (LUBIPROSTONE) two times a day with meals

## 2010-08-05 NOTE — Assessment & Plan Note (Signed)
Summary: U/S R ARM,FLUID ON L KNEE,MC   Vital Signs:  Patient profile:   45 year old female Menstrual status:  irregular Weight:      216 pounds BP sitting:   108 / 74  Vitals Entered By: Lillia Pauls CMA (February 04, 2010 8:49 AM)  Referring Provider:  na Primary Provider:  [name]  DO   History of Present Illness: 45yo R-handed female to office for L knee pain & possible R shoulder u/s.  c/o increasing L knee pain x several months.  No trauma/injury. Pain with standing long periods of time & walking up/down steps. Occasional swelling.  Occasional clicking, no catching or locking. Feels unstable at times. X-rays completed 01/19/10 show no abnormality. Taking percocet, soma, & using voltaren gel with only minimal relief. Wearing knee sleeve at times. Does have long hx of joint pains.  RE: R shoulder Has noted large knot/lump on outside of R arm >75-months, feels area is getting larger.  More prominent since loosing weight. No injury or trauma. Some associated pain into R arm & hand, but this has been long-standing. Had immunzations as doctor visit yesterday.    Allergies: 1)  ! Darvocet 2)  ! Neurontin 3)  ! Valium 4)  ! * Zofran 5)  ! Pcn 6)  ! Ultram 7)  ! Depakote PMH-FH-SH reviewed for relevance  Review of Systems      See HPI  Physical Exam  General:  alert, well-developed, and well-nourished.   Lungs:  respiration unlabored Msk:  Left Knee: Normal to inspection with no erythema or effusion or obvious bony abnormalities. TTP over patella, no joint line tenderness, no tenderness over tibial tubercle normal ROM with crepitus.  Ligaments with solid consistent endpoints including ACL, PCL, LCL, MCL. Negative Mcmurray's and provocative meniscal tests. Patellar compression is painful. Patellar and quadriceps tendons unremarkable. Weak quadriceps & VMO  R Knee: Normal to inspection with no erythema or effusion or obvious bony abnormalities. Mild TTP  over patella - less than on left, no joint line tenderness, no tenderness over tibial tubercle normal ROM.  Ligaments with solid consistent endpoints including ACL, PCL, LCL, MCL. Negative Mcmurray's and provocative meniscal tests. Patellar compression is slightly painful, much less than on left. Patellar and quadriceps tendons unremarkable. Slight quadriceps weakness.    Extremities:  Right Upper Arm: 2-3 cm area of tenderness on lateral aspect of R upper arm.  No erythema, no increased warmth.  Area of echymosis just superior to this area from recent immunizations. Neurologic:  sensation intact to light touch   Impression & Recommendations:  Problem # 1:  KNEE PAIN, BILATERAL (ICD-719.46) - Pain greatest on L today, consistant with patellofemoral syndrome - Given [place] straps in office today to wear when walking/active - Given handout on home exercises - 3 sets of 10 holding for 10 seconds: Quad Sets, Straight Leg Raise, Limited Knee extensions - Should avoid any activities require deep flexion. - Rx for mobic given - f/u 4-6 weeks for re-evaluation.  Her updated medication list for this problem includes:    Percocet 5-325 Mg Tabs (Oxycodone-acetaminophen) .Marland Kitchen... Take 1 tablet by mouth every 6 hours as needed    Soma 250 Mg Tabs (Carisoprodol) ..... One tab by mouth three times a day prn    Meloxicam 15 Mg Tabs (Meloxicam) .Marland Kitchen... 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 Shanon Rosser do MSK U/S in office today due to large bruising in upper arms b/l from recent immunizations.  Can do u/s at f/u visit once bruising resolves. - Should cont. to monitor area for any change.  Complete Medication List: 1)  Nexium 40 Mg Cpdr (Esomeprazole magnesium) .... Take 1 tablet by mouth  two times a day 2)  Zomig 5 Mg Tabs (Zolmitriptan) .... Take 1 tablet by mouth once a day as needed migraine 3)  Percocet 5-325 Mg Tabs (Oxycodone-acetaminophen) .... Take 1 tablet by mouth every 6 hours as needed 4)  Alprazolam 2 Mg Tabs (Alprazolam) .... Take 1 tablet by mouth three times a day 5)  Tricor 145 Mg Tabs (Fenofibrate) .... Take 1 tabelt by mouth once daily 6)  Transderm-scop Pt72 (Scopolamine base pt72) .... Apply every 48 hours as needed 7)  Soma 250 Mg Tabs (Carisoprodol) .... One tab by mouth three times a day prn 8)  Gyne-lotrimin 1 % Crea (Clotrimazole) .... One application as directed. pharmacy may substitute equivalent 9)  Voltaren 1 % Gel (Diclofenac sodium) .... Use twice daily, as needed for pain. 10)  Terazol 7 0.4 % Crea (Terconazole) .... Use as directed 11)  Vaniqa 13.9 % Crea (Eflornithine hcl) .... Apply as directed 12)  Fluconazole 100 Mg Tabs (Fluconazole) .... Take one pill daily as needed for yeast infection/thrush 13)  Topamax 50 Mg Tabs (Topiramate) .... Start one pill dailly for one week then increase to twice daily until your next visit 14)  Prezista 400 Mg Tabs (Darunavir ethanolate) .... Take two tablets once a day with norvir and viread 15)  Viread 300 Mg Tabs (Tenofovir disoproxil fumarate) .... Take once a day with two prezistas and one norvir 16)  Combivir 150-300 Mg Tabs (Lamivudine-zidovudine) .... Take 1 tablet by mouth two times a day 17)  Norvir 100 Mg Tabs (Ritonavir) .... Take one tablet with the two prezistas and one viread 18)  Crestor 40 Mg Tabs (Rosuvastatin calcium) .... Take 1 tablet by mouth once a day (this replaces lipitor) 19)  Promethazine Hcl 50 Mg Tabs (Promethazine hcl) .... Take one half to one whole tablet up to four times daily as needed nausea 20)  Hibiclens 4 % Liqd (Chlorhexidine gluconate) .... Use as directed two times a day 21)  Amitiza 8 Mcg Caps (Lubiprostone) .... Two times a day with meals 22)  Kristalose 20 Gm Pack  (Lactulose) .... 20 gm q hs 23)  Domeperidome 10 Mg  .Marland KitchenMarland Kitchen. 1 by mouth three times a day 24)  Meloxicam 15 Mg Tabs (Meloxicam) .Marland Kitchen.. 1 tab by mouth daily with food x 14 days, then as needed for pain Prescriptions: MELOXICAM 15 MG TABS (MELOXICAM) 1 tab by mouth daily with food x 14 days, then as needed for pain  #30 x 1   Entered and Authorized by:   Darene Lamer MD   Signed by:   Lillia Pauls CMA on 02/04/2010   Method used:   Electronically to        [facility] (retail)       [address]       [place], [place]  [id number]       Ph: [phone]       Fax: [phone]   RxID:   [id number]

## 2010-08-05 NOTE — Assessment & Plan Note (Signed)
Summary: Nurse Visit (Infectious Disease)   Prior Medications: NEXIUM 40 MG CPDR (ESOMEPRAZOLE MAGNESIUM) Take 1 tablet by mouth two times a day ZOMIG 5 MG TABS (ZOLMITRIPTAN) Take 1 tablet by mouth once a day as needed migraine PERCOCET 5-325 MG TABS (OXYCODONE-ACETAMINOPHEN) Take 1 tablet by mouth every 6 hours as needed ALPRAZOLAM 2 MG TABS (ALPRAZOLAM) Take 1 tablet by mouth three times a day TRICOR 145 MG  TABS (FENOFIBRATE) take 1 tabelt by mouth once daily TRANSDERM-SCOP  PT72 (SCOPOLAMINE BASE PT72) Apply every 48 hours as needed SOMA 250 MG TABS (CARISOPRODOL) one tab by mouth three times a day prn GYNE-LOTRIMIN 1 %  CREA (CLOTRIMAZOLE) One application as directed. Pharmacy may substitute equivalent VOLTAREN 1 %  GEL (DICLOFENAC SODIUM) Use twice daily, as needed for pain. TERAZOL 7 0.4 % CREA (TERCONAZOLE) Use as directed VANIQA 13.9 % CREA (EFLORNITHINE HCL) Apply as directed FLUCONAZOLE 100 MG TABS (FLUCONAZOLE) Take one pill daily as needed for yeast infection/thrush TOPAMAX 50 MG TABS (TOPIRAMATE) Start one pill dailly for one week then increase to twice daily until your next visit PREZISTA 400 MG TABS (DARUNAVIR ETHANOLATE) take two tablets once a day with norvir and viread VIREAD 300 MG TABS (TENOFOVIR DISOPROXIL FUMARATE) take once a day with two prezistas and one norvir COMBIVIR 150-300 MG TABS (LAMIVUDINE-ZIDOVUDINE) Take 1 tablet by mouth two times a day NORVIR 100 MG TABS (RITONAVIR) take one tablet with the two prezistas and one viread CRESTOR 40 MG TABS (ROSUVASTATIN CALCIUM) Take 1 tablet by mouth once a day (this replaces lipitor) PROMETHAZINE HCL 50 MG TABS (PROMETHAZINE HCL) take one half to one whole tablet up to four times daily as needed nausea HIBICLENS 4 % LIQD (CHLORHEXIDINE GLUCONATE) Use as directed two times a day AMITIZA 8 MCG CAPS (LUBIPROSTONE) two times a day with meals KRISTALOSE 20 GM  PACK (LACTULOSE) 20 gm q hs DOMEPERIDOME 10 MG () 1 by mouth  three times a day MELOXICAM 15 MG TABS (MELOXICAM) 1 tab by mouth daily with food x 14 days, then as needed for pain CYANOCOBALAMIN 1000 MCG/ML SOLN (CYANOCOBALAMIN) Administer 1.0 mL intramuscularly monthly SENOKOT XTRA 17.2 MG TABS (SENNOSIDES) Take 1-2 tablet by mouth as needed for constipation AMITRIPTYLINE HCL 25 MG TABS (AMITRIPTYLINE HCL) 1 by mouth qhs LEVAQUIN 250 MG TABS (LEVOFLOXACIN) Take 1 tablet by mouth once a day Current Allergies: ! DARVOCET ! NEURONTIN ! VALIUM ! * ZOFRAN ! PCN ! ULTRAM ! 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

## 2010-08-05 NOTE — Assessment & Plan Note (Signed)
Summary: resfrom 4/18   Primary Provider:  [name]  DO  CC:  f/u labs, Depression, and Lipid Management.  History of Present Illness: 45 yo Caucasian lady with HIV, Resitant virus (failed combivir and sustiva) on salvage regimen of combivir, viread and boosted prezista, with multiple comorbidities including hyperlipidemia, migrainous headaches. Her viral load has been perfectly suppressed at less than 48 and cd4 in 400-500 range. She continueshaving trouble with vomiting still, occasionally vomitinb back  up medicaitons, occasianally not taking a dose becuase of the nausea. SHe also statest that her crestor is causing her muscle aches and has done so for many months and that Dr. Phillips Odor is aware of this (her note indicates this as well). Muscle aches are "all over."   Depression History:      The patient denies a depressed mood most of the day and a diminished interest in her usual daily activities.        The patient denies that she feels like life is not worth living, denies that she wishes that she were dead, and denies that she has thought about ending her life.         Lipid Management History:      Positive NCEP/ATP III risk factors include early menopause without estrogen hormone replacement and HDL cholesterol less than 40.  Negative NCEP/ATP III risk factors include female age less than 68 years old, non-diabetic, no family history for ischemic heart disease, non-tobacco-user status, non-hypertensive, no ASHD (atherosclerotic heart disease), no prior stroke/TIA, no peripheral vascular disease, and no history of aortic aneurysm.    Problems Prior to Update: 1)  Hot Flashes  (ICD-627.2) 2)  Rhinitis  (ICD-472.0) 3)  Lipomas, Multiple  (ICD-214.9) 4)  Degenerative Disc Disease, Lumbosacral Spine  (ICD-722.52) 5)  Inguinal Hernias, Bilateral  (ICD-550.92) 6)  Hyperlipidemia  (ICD-272.4) 7)  Acquired Spondylolisthesis  (ICD-738.4) 8)  Iliotibial Band Syndrome, Bilateral   (ICD-728.89) 9)  Sinusitis, Recurrent  (ICD-473.9) 10)  Venous Insufficiency, Mild  (ICD-459.81) 11)  Myalgia  (ICD-729.1) 12)  Knee Pain, Bilateral  (ICD-719.46) 13)  Hirsutism  (ICD-704.1) 14)  Nausea and Vomiting  (ICD-787.01) 15)  Urinary Incontinence  (ICD-788.30) 16)  Back Pain  (ICD-724.5) 17)  Hx of Dvt  (ICD-453.40) 18)  Obesity Nos  (ICD-278.00) 19)  Non-hodgkin's Lymphoma, Hx of  (ICD-V10.79) 20)  Peripheral Neuropathy  (ICD-356.9) 21)  Asthma  (ICD-493.90) 22)  Anxiety  (ICD-300.00) 23)  Hyperlipidemia  (ICD-272.4) 24)  Depression  (ICD-311) 25)  Genital Herpes, Hx of  (ICD-V13.8) 26)  Gastroparesis  (ICD-536.3) 27)  Gerd  (ICD-530.81) 28)  Irritable Bowel Syndrome  (ICD-564.1) 29)  HIV Infection  (ICD-042) 30)  Migraine Headache  (ICD-346.90) 31)  Hx of Esophagitis  (ICD-530.10)  Medications Prior to Update: 1)  Nexium 40 Mg Cpdr (Esomeprazole Magnesium) .... Take 1 Tablet By Mouth Two Times A Day 2)  Maxalt-Mlt 10 Mg Tbdp (Rizatriptan Benzoate) .... Take One Tab By Mouth Daily As Needed For Migraine 3)  Percocet 5-325 Mg Tabs (Oxycodone-Acetaminophen) .... Take 1 Tablet By Mouth Every 6 Hours As Needed 4)  Alprazolam 2 Mg Tabs (Alprazolam) .... Take 1 Tablet By Mouth Three Times A Day 5)  Tricor 145 Mg  Tabs (Fenofibrate) .... Take 1 Tabelt By Mouth Once Daily 6)  Transderm-Scop  Pt72 (Scopolamine Base Pt72) .... Apply Every 48 Hours As Needed 7)  Soma 250 Mg Tabs (Carisoprodol) .... One Tab By Mouth Three Times A Day Prn 8)  Gyne-Lotrimin 1 %  Crea (Clotrimazole) .... One Application As Directed. Pharmacy May Substitute Equivalent 9)  Voltaren 1 %  Gel (Diclofenac Sodium) .... Use Twice Daily, As Needed For Pain. 10)  Terazol 7 0.4 % Crea (Terconazole) .... Use As Directed 11)  Vaniqa 13.9 % Crea (Eflornithine Hcl) .... Apply As Directed 12)  Fluconazole 100 Mg Tabs (Fluconazole) .... Take One Pill Daily As Needed For Yeast Infection/thrush 13)  Topamax 50 Mg Tabs  (Topiramate) .... Start One Pill Dailly For One Week Then Increase To Twice Daily Until Your Next Visit 14)  Prezista 400 Mg Tabs (Darunavir Ethanolate) .... Take Two Tablets Once A Day With Norvir and Viread 15)  Viread 300 Mg Tabs (Tenofovir Disoproxil Fumarate) .... Take Once A Day With Two Prezistas and One Norvir 16)  Combivir 150-300 Mg Tabs (Lamivudine-Zidovudine) .... Take 1 Tablet By Mouth Two Times A Day 17)  Norvir 100 Mg Tabs (Ritonavir) .... Take One Tablet With The Two Prezistas and One Viread 18)  Crestor 40 Mg Tabs (Rosuvastatin Calcium) .... Take 1 Tablet By Mouth Once A Day (This Replaces Lipitor) 19)  Promethazine Hcl 50 Mg Tabs (Promethazine Hcl) .... Take One Half To One Whole Tablet Up To Four Times Daily As Needed Nausea  Current Medications (verified): 1)  Nexium 40 Mg Cpdr (Esomeprazole Magnesium) .... Take 1 Tablet By Mouth Two Times A Day 2)  Maxalt-Mlt 10 Mg Tbdp (Rizatriptan Benzoate) .... Take One Tab By Mouth Daily As Needed For Migraine 3)  Percocet 5-325 Mg Tabs (Oxycodone-Acetaminophen) .... Take 1 Tablet By Mouth Every 6 Hours As Needed 4)  Alprazolam 2 Mg Tabs (Alprazolam) .... Take 1 Tablet By Mouth Three Times A Day 5)  Tricor 145 Mg  Tabs (Fenofibrate) .... Take 1 Tabelt By Mouth Once Daily 6)  Transderm-Scop  Pt72 (Scopolamine Base Pt72) .... Apply Every 48 Hours As Needed 7)  Soma 250 Mg Tabs (Carisoprodol) .... One Tab By Mouth Three Times A Day Prn 8)  Gyne-Lotrimin 1 %  Crea (Clotrimazole) .... One Application As Directed. Pharmacy May Substitute Equivalent 9)  Voltaren 1 %  Gel (Diclofenac Sodium) .... Use Twice Daily, As Needed For Pain. 10)  Terazol 7 0.4 % Crea (Terconazole) .... Use As Directed 11)  Vaniqa 13.9 % Crea (Eflornithine Hcl) .... Apply As Directed 12)  Fluconazole 100 Mg Tabs (Fluconazole) .... Take One Pill Daily As Needed For Yeast Infection/thrush 13)  Topamax 50 Mg Tabs (Topiramate) .... Start One Pill Dailly For One Week Then  Increase To Twice Daily Until Your Next Visit 14)  Prezista 400 Mg Tabs (Darunavir Ethanolate) .... Take Two Tablets Once A Day With Norvir and Viread 15)  Viread 300 Mg Tabs (Tenofovir Disoproxil Fumarate) .... Take Once A Day With Two Prezistas and One Norvir 16)  Combivir 150-300 Mg Tabs (Lamivudine-Zidovudine) .... Take 1 Tablet By Mouth Two Times A Day 17)  Norvir 100 Mg Tabs (Ritonavir) .... Take One Tablet With The Two Prezistas and One Viread 18)  Crestor 40 Mg Tabs (Rosuvastatin Calcium) .... Take 1 Tablet By Mouth Once A Day (This Replaces Lipitor) 19)  Promethazine Hcl 50 Mg Tabs (Promethazine Hcl) .... Take One Half To One Whole Tablet Up To Four Times Daily As Needed Nausea  Allergies: 1)  ! Darvocet 2)  ! Neurontin 3)  ! Valium 4)  ! * Zofran 5)  ! Pcn 6)  ! Ultram 7)  ! Depakote        Preventive Screening-Counseling & Management  Alcohol-Tobacco  Alcohol drinks/day: 0     Smoking Status: never  Caffeine-Diet-Exercise     Caffeine use/day: coffee, tea and sodas     Does Patient Exercise: yes     Type of exercise: WALK     Exercise (avg: min/session): 30-60     Times/week: <3   Current Allergies (reviewed today): ! DARVOCET ! NEURONTIN ! VALIUM ! * ZOFRAN ! PCN ! ULTRAM ! DEPAKOTE Past History:  Past Medical History: Last updated: 04/29/2008 Hx Non-Hodgkins Lymphoma Stage IIA Recurrent Sinusitis History of Peptic ulcer disease-EGD 1/99-Dr. 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  Family History: Last updated: 04/29/2008 Mom - HTN, hx of TIA Dad - died at 68 yo of suicide-(GSW  when she was 15 yrs.)  Social History: Last updated: 03/24/2008 Single Lives alone No ETOH No Tobacco Abuse  Risk Factors: Alcohol Use: 0 (10/22/2009) Caffeine Use: coffee, tea and sodas (10/22/2009) Exercise: yes (10/22/2009)  Risk Factors: Smoking Status: never (10/22/2009)  Family History: Reviewed history from 04/29/2008 and no changes required. Mom - HTN, hx of TIA Dad - died at 82 yo of suicide-(GSW  when she was 15 yrs.)  Social History: Reviewed history from 03/24/2008 and no changes required. Single Lives alone No ETOH No Tobacco Abuse  Review of Systems       The patient complains of headaches, abdominal pain, and severe indigestion/heartburn.  The patient denies anorexia, fever, weight loss, weight gain, vision loss, decreased hearing, hoarseness, chest pain, syncope, dyspnea on exertion, peripheral edema, prolonged cough, hemoptysis, melena, hematochezia, hematuria, incontinence, genital sores, muscle weakness, suspicious skin lesions, transient blindness, difficulty walking, depression, unusual weight change, abnormal bleeding, enlarged lymph nodes, and angioedema.    Vital Signs:  Patient profile:   45 year old female Menstrual status:  irregular Height:      66 inches (167.64 cm) Weight:      222 pounds (100.91 kg) BMI:     35.96 Temp:     98.6 degrees F (37.00 degrees C) oral Pulse rate:   76 / minute BP sitting:   114 / 78  (left arm)  Vitals Entered By: Starleen Arms CMA (October 22, 2009 8:54 AM) CC: f/u labs, Depression, Lipid Management Is Patient Diabetic? No Pain Assessment Patient in pain? no      Nutritional Status BMI of > 30 = obese Nutritional Status Detail nl  Does patient need  assistance? Functional Status Self care Ambulation Normal   Physical Exam  General:  alert, well-nourished, well-hydrated, and overweight-appearing.   Head:  normocephalic and atraumatic.   Eyes:  vision grossly intact, pupils equal, and pupils round.   Ears:  no external deformities and ear piercing(s) noted.   Nose:  no external deformity, no external erythema, and no nasal discharge.   Mouth:  pharynx pink and moist, no erythema, and no exudates Lungs:  normal respiratory effort, no crackles, and no wheezes.   Heart:  normal rate, regular rhythm, no murmur, no gallop, and no rub.   Abdomen:  soft, non-tender, normal bowel sounds, and no distention.   Msk:  normal ROM and no joint deformities.   Neurologic:  alert & oriented X3, strength normal in all extremities, and gait normal.   Skin:  turgor normal and no ecchymoses.   Psych:  Oriented X3, memory intact for recent and remote, normally interactive and in good spirits        Medication Adherence: 10/22/2009   Adherence to medications reviewed with patient. Counseling to provide adequate adherence provided   Prevention For Positives: 10/22/2009   Safe sex practices discussed with patient. Condoms offered.   Education Materials Provided: 10/22/2009 Safe sex practices discussed with patient. Condoms offered.                          Impression & Recommendations:  Problem # 1:  HIV INFECTION (ICD-042) Excellent control! Her updated medication list for this problem includes:    Fluconazole 100 Mg Tabs (Fluconazole) .Marland Kitchen... Take one pill daily as needed for yeast infection/thrush  Orders: T-CD4SP [facility]) (CD4SP) T-HIV Viral Load [phone]) T-CBC w/Diff [phone]) T-Comprehensive Metabolic Panel [phone]) T-Hepatitis B Surface Antigen ([id number]) T-Hepatitis B Surface Antibody ([id number]) T-Hepatitis C Antibody ([id number]) Est. Patient Level IV (99214)Future Orders: T-HIV Viral Load ([id number]) ...  02/15/2010 T-CBC w/Diff ([id number]) ... 02/15/2010 T-Comprehensive Metabolic Panel [phone]) ... 02/15/2010 T-CD4SP ([facility]) (CD4SP) ... 02/15/2010  Diagnostics Reviewed:  HIV: CDC-defined AIDS (06/04/2009)   CD4: 490 (09/24/2009)   WBC: 3.9 (09/23/2009)   Hgb: 12.9 (09/23/2009)   HCT: 39.3 (09/23/2009)   Platelets: 134 (09/23/2009) HIV genotype: * (05/13/2009)   HIV-1 RNA: <48 copies/mL (09/23/2009)     Problem # 2:  MYOSITIS (ICD-729.1) Will check cpk today. Skeptical that she truly has myositis from her statin. Her updated medication list for this problem includes:    Percocet 5-325 Mg Tabs (Oxycodone-acetaminophen) .Marland Kitchen... Take 1 tablet by mouth every 6 hours as needed    Soma 250 Mg Tabs (Carisoprodfromol) ..... One tab by mouth three times a day as nee  Orders: CK (Creatine Kinase)-[facility] ([id number]) Est. Patient Level IV (02542)  Problem # 3:  HYPERLIPIDEMIA (ICD-272.4)  If she is considered cured of diabetes through weight loss and htn is removed from her problem list she would be at goal for all lipid goals. If needed in future we could swap in an integrase inhibitor such as raltegravir in place of her darunavir and ritonavir but given her virologic failure this fall I would not want to risk that yet. I also would prefer to have her on one of the newer once a day integrase inhibitors if we mack that change to ease compliance Her updated medication list for this problem includes:    Tricor 145 Mg Tabs (Fenofibrate) .Marland Kitchen... Take 1 tabelt by mouth once dailyse ini    Crestor 40 Mg Tabs (Rosuvastatin calcium) .Marland Kitchen... Take 1 tablet by mouth once a day (this replaces lipitor)  Orders: Est. Patient Level IV (70623)  Problem # 4:  OBESITY NOS (ICD-278.00) continues to lose weig. Excllent job  Problem # 5:  DEPRESSION (ICD-311) feels it is relatively well controlled Her updated medication list for this problem includes:    Alprazolam 2 Mg Tabs (Alprazolam) .Marland Kitchen... Take 1 tablet by  mouth three times a day  Problem # 6:  NON-HODGKIN'S LYMPHOMA, HX OF (ICD-V10.79) in remission. getting to be 5 years out followed by dr. Cyndie Chime Orders: Est. Patient Level IV (76283)  Problem # 7:  NAUSEA AND VOMITING (ICD-787.01) refilled her phenergan. THis is a chronic problem  Lipid Assessment/Plan:      Based on NCEP/ATP III, the patient's risk factor category is "2 or more risk factors and a calculated 10 year CAD risk of < 20%".  The patient's lipid goals are as follows: Total cholesterol goal is 200; LDL cholesterol goal is 100; HDL cholesterol goal is 40; Triglyceride goal is 150.    Patient Instructions: 1)  We willl check labs today 2)  I will let you know if we need to stop the crestor 3)  Make followup appt with Dr. Daiva Eves in August   Prescriptions: PROMETHAZINE HCL 50 MG TABS (PROMETHAZINE HCL) take one half to one whole tablet up to four times daily as needed nausea  #60 x 11   Entered and Authorized by:   Acey Lav MD   Signed by:   Paulette Blanch Dam MD on 10/22/2009   Method used:   Electronically to        [facility] (retail)       [phone]       [place], [place]  [id number]       Ph: [phone]       Fax: [phone]   RxID:   [id number]

## 2010-08-05 NOTE — Assessment & Plan Note (Signed)
Summary: constipation....em    History of Present Illness Visit Type: Follow-up Visit Primary GI MD: Sheryn Bison MD FACP FAGA Primary Provider: [name]  DO Chief Complaint: constipation x 2 months, with worsening  symptoms;patient had small bowel this morning for the first time in a month History of Present Illness:   Morbidly obese very complicated 45 year old Caucasian female with chronic HIV infection in remission followed by the infectious disease clinic. She also apparently has had previous non-Hodgkin's lymphoma has completed chemotherapy and is followed by hematology. She has a long long history of GI problems managed by Dr. Victorino Dike and apparently has had multiple colonoscopies and endoscopic exams.  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 She is very verbose and goes into great detail about her constipation, gas, bloating, and bowel regime to the point suggesting underlying psychiatric problems. She has a daily bowel movement and denies rectal bleeding or abdominal pain. She has voluntarily lost 100 pounds in weight over the last several years. She is on greater than 20 medications and as multiple drug allergies listed in her chart. Her past medical history is overwhelming as is the number of surgical procedure she's had for degenerative spine disease, peripheral neuropathy, restless leg syndrome et Tera Mater. She had laparotomy and cholecystectomy in 1989. Her constipation is back to childhood. She relates that probiotics and passed it made her" worse".   GI Review of Systems    Reports abdominal pain, acid reflux, nausea, and  vomiting.     Location of  Abdominal pain: lower abdomen.    Denies belching, bloating, chest pain, dysphagia with liquids, dysphagia with solids, heartburn, loss of appetite, vomiting blood, weight loss, and  weight gain.        Reports constipation, hemorrhoids, irritable bowel syndrome, and  rectal bleeding.     Denies anal fissure, black tarry stools, change in bowel habit, diarrhea, diverticulosis, fecal incontinence, heme positive stool, jaundice, light color stool, liver problems, and  rectal pain.    Current Medications (verified): 1)  Nexium 40 Mg Cpdr (Esomeprazole Magnesium) .... Take 1 Tablet By Mouth Two Times A Day 2)  Zomig 5 Mg Tabs (Zolmitriptan) .... Take 1 Tablet By Mouth Once A Day As Needed Migraine 3)  Percocet 5-325 Mg Tabs (Oxycodone-Acetaminophen) .... Take 1 Tablet By Mouth Every 6 Hours As Needed 4)  Alprazolam 2 Mg Tabs (Alprazolam) .... Take 1 Tablet By Mouth Three Times A Day 5)  Tricor 145 Mg  Tabs (Fenofibrate) .... Take 1 Tabelt By Mouth Once Daily 6)  Transderm-Scop  Pt72 (Scopolamine Base Pt72) .... Apply Every 48 Hours As Needed 7)  Soma 250 Mg Tabs (Carisoprodol) .... One Tab By Mouth Three Times A Day Prn 8)  Gyne-Lotrimin 1 %  Crea (Clotrimazole) .... One Application As Directed. Pharmacy May Substitute Equivalent 9)  Voltaren 1 %  Gel (Diclofenac Sodium) .... Use Twice Daily, As Needed For Pain. 10)  Terazol 7 0.4 % Crea (Terconazole) .... Use As Directed 11)  Vaniqa 13.9 % Crea (Eflornithine Hcl) .... Apply As Directed 12)  Fluconazole 100 Mg Tabs (Fluconazole) .... Take One Pill Daily As Needed For Yeast Infection/thrush 13)  Topamax 50 Mg Tabs (Topiramate) .... Start One Pill Dailly For One Week Then Increase To Twice Daily Until Your Next Visit 14)  Prezista 400 Mg Tabs (Darunavir Ethanolate) .... Take Two Tablets Once A Day With  Norvir and Viread 15)  Viread 300 Mg Tabs (Tenofovir Disoproxil Fumarate) .... Take Once A Day With Two Prezistas and One Norvir 16)  Combivir 150-300 Mg Tabs (Lamivudine-Zidovudine) .... Take 1 Tablet By Mouth Two Times A Day 17)  Norvir 100 Mg Tabs (Ritonavir) .... Take One Tablet With The Two Prezistas and One Viread 18)  Crestor 40 Mg Tabs  (Rosuvastatin Calcium) .... Take 1 Tablet By Mouth Once A Day (This Replaces Lipitor) 19)  Promethazine Hcl 50 Mg Tabs (Promethazine Hcl) .... Take One Half To One Whole Tablet Up To Four Times Daily As Needed Nausea 20)  Hibiclens 4 % Liqd (Chlorhexidine Gluconate) .... Use As Directed Two Times A Day  Allergies (verified): 1)  ! Darvocet 2)  ! Neurontin 3)  ! Valium 4)  ! * Zofran 5)  ! Pcn 6)  ! Ultram 7)  ! Depakote  Past History:  Past medical, surgical, family and social histories (including risk factors) reviewed for relevance to current acute and chronic problems.  Past Medical History: Reviewed history from 04/29/2008 and no changes required. Hx Non-Hodgkins Lymphoma Stage IIA Recurrent Sinusitis History of Peptic ulcer disease-EGD 1/99-Dr. Corinda Gubler History of Esophagitis  Obesity Otitis media-w/ear tubes/hx reconstruction Migraine headaches HIV infection-on HAART  dx 1992 Plantar wart-foot Estrogen deficiency-on Premarin Constipation Anxiety Asthma Depression Meningitis age 43 Tonsillectomy age 65  Post-herpetic Neuralgia L5-SI Spondylolithesis-pars defect Shingles, Lumbar Dermatome Diabetes mellitus, type II GERD Hyperlipidemia Hypertension Peripheral neuropathy Non-Hodgkin's lymphoma Gastroparesis IBS Restless leg syndrome  Past Surgical History: Reviewed history from 01/29/2008 and no changes required. Endoscopy 8119,1478 per Dr. 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  Family History: Reviewed history from 04/29/2008 and no changes required. Mom - HTN, hx of TIA Dad - died at 44 yo of suicide-(GSW  when she was 15 yrs.)  Social History: Reviewed history from 03/24/2008 and no changes required. Single Lives alone No ETOH No Tobacco Abuse  Review of Systems       The patient complains of back pain, headaches-new, muscle pains/cramps, and swelling of feet/legs.  The patient denies allergy/sinus, anemia, anxiety-new,  arthritis/joint pain, blood in urine, breast changes/lumps, change in vision, confusion, cough, coughing up blood, depression-new, fainting, fatigue, fever, hearing problems, heart murmur, heart rhythm changes, itching, menstrual pain, night sweats, nosebleeds, pregnancy symptoms, shortness of breath, skin rash, sleeping problems, sore throat, swollen lymph glands, thirst - excessive , urination - excessive , urination changes/pain, urine leakage, vision changes, and voice change.    Vital Signs:  Patient profile:   45 year old female Menstrual status:  irregular Height:      66 inches Weight:      222.25 pounds BMI:     36.00 Pulse rate:   64 / minute Pulse rhythm:   regular BP sitting:   100 / 66  (right arm) Cuff size:   regular  Vitals Entered By: June McMurray CMA Duncan Dull) (Nov 17, 2009 2:28 PM)  Physical Exam  General:  Well developed, well nourished, no acute distress.healthy appearing and obese.  healthy appearing and obese.   Head:  Normocephalic and atraumatic. Eyes:  PERRLA, no icterus.exam deferred to patient's ophthalmologist.  exam deferred to patient's ophthalmologist.   Neck:  Supple; no masses or thyromegaly. Lungs:  Clear throughout to auscultation. Heart:  Regular rate and rhythm; no murmurs, rubs,  or bruits. Abdomen:  Soft, nontender and nondistended. No masses, hepatosplenomegaly or hernias noted. Normal bowel sounds.obese.  obese.   Extremities:  No  clubbing, cyanosis, edema or deformities noted. Neurologic:  Alert and  oriented x4;  grossly normal neurologically. Skin:  Intact without significant lesions or rashes. Cervical Nodes:  No significant cervical adenopathy. Psych:  Alert and cooperative. Normal mood and affect.easily distracted and hyperactive.  easily distracted.     Impression & Recommendations:  Problem # 1:  CONSTIPATION (ICD-564.00) Assessment Deteriorated  I see no reason to repeat her multiple endoscopic and colonoscopy evaluations. I doubt  we will make any progress in treating her and her chronic bowel syndrome she continues to use daily narcotics for her back problems. I will give her a trial ofAmitiza 24 micrograms twice a day with Kristalose 20 g at bedtime. A few screening laboratory parameters have been ordered. She obviously has problems with multiple somatic complaints and chronic illnesses. She may need psychiatric referral.Also would consider referral to a pain management clinic for multiple pronged treatment of her chronic pain syndrome.  Problem # 2:  ACQUIRED SPONDYLOLISTHESIS (ICD-738.4) Assessment: Comment Only  Problem # 3:  OBESITY NOS (ICD-278.00) Assessment: Comment Only Not a candidate for bariatric surgery. Allegedly she is losing weight with her on dietary management.  Problem # 4:  NON-HODGKIN'S LYMPHOMA, HX OF (ICD-V10.79) Assessment: Comment Only  Problem # 5:  HIV INFECTION (ICD-042) Assessment: Comment Only Continue multiple medications per infectious disease clinic.  Other Orders: TLB-B12, Serum-Total ONLY (16109-U04) TLB-Ferritin (82728-FER) TLB-Folic Acid (Folate) (82746-FOL) TLB-IBC Pnl (Iron/FE;Transferrin) (83550-IBC) TLB-TSH (Thyroid Stimulating Hormone) (84443-TSH) TLB-IgA (Immunoglobulin A) (82784-IGA) T-Sprue Panel (Celiac Disease Aby Eval) (83516x3/[address]) T- * Misc. Laboratory test [phone])  Patient Instructions: 1)  Please go to the basement for lab work. 2)  Begin Amitiza two times a day. 3)  Begin Kristolose at bedtime. 4)  Prescriptions will be sent to your pharmacy. 5)  The medication list was reviewed and reconciled.  All changed / newly prescribed medications were explained.  A complete medication list was provided to the patient / caregiver. 6)  Copy sent to : Dr. [name] 7)  Please continue current medications.  8)  Constipation and Hemorrhoids brochure given.  9)  Please schedule a follow-up appointment in 3 weeks.   Appended Document:  constipation....em    Clinical Lists Changes  Medications: Added new medication of AMITIZA 24 MCG  CAPS (LUBIPROSTONE) 1 two times a day/take with food and water - Signed Added new medication of KRISTALOSE 20 GM  PACK (LACTULOSE) 20 gm q hs - Signed Added new medication of ANALPRAM-HC 1-1 %  CREA (HYDROCORTISONE ACE-PRAMOXINE) prn - Signed Rx of AMITIZA 24 MCG  CAPS (LUBIPROSTONE) 1 two times a day/take with food and water;  #60 x 6;  Signed;  Entered by: Ashok Cordia RN;  Authorized by: Mardella Layman MD [facility];  Method used: Electronically to [facility] [phone] [address], [place], [place], [place]  [id number], Ph: [phone], Fax: [phone] Rx of KRISTALOSE 20 GM  PACK (LACTULOSE) 20 gm q hs;  #30 x 6;  Signed;  Entered by: Ashok Cordia RN;  Authorized by: Mardella Layman MD [facility];  Method used: Electronically to [facility] [phone] [address], [place], [place], [place]  [id number], Ph: [phone], Fax: [phone] Rx of ANALPRAM-HC 1-1 %  CREA (HYDROCORTISONE ACE-PRAMOXINE) prn;  #1 tube x 3;  Signed;  Entered by: Ashok Cordia RN;  Authorized by: Mardella Layman MD [facility];  Method used: Electronically to [facility] [phone] [address], [place], [place], [place]  [id number], Ph:  [phone], Fax: [phone]    Prescriptions: ANALPRAM-HC 1-1 %  CREA (HYDROCORTISONE ACE-PRAMOXINE) prn  #1 tube x 3   Entered by:   Ashok Cordia RN   Authorized by:   Mardella Layman MD [facility]   Signed by:   Ashok Cordia RN on 11/17/2009   Method used:   Electronically to        [facility] (retail)       [address]       [place], [place]  [address]       Ph: [phone]       Fax: [phone]   RxID:   [id number] KRISTALOSE 20 GM  PACK (LACTULOSE) 20 gm q hs  #30 x 6   Entered by:   Ashok Cordia RN   Authorized by:   Mardella Layman MD [facility]   Signed by:   Ashok Cordia RN on  11/17/2009   Method used:   Electronically to        [facility] (retail)       [address]       [place], [place]  [address]       Ph: [phone]       Fax: [phone]   RxID:   [id number] AMITIZA 24 MCG  CAPS (LUBIPROSTONE) 1 two times a day/take with food and water  #60 x 6   Entered by:   Ashok Cordia RN   Authorized by:   Mardella Layman MD [facility]   Signed by:   Ashok Cordia RN on 11/17/2009   Method used:   Electronically to        [facility] (retail)       [address]       [place], [place]  [address]       Ph: [phone]       Fax: [phone]   RxID:   [id number]

## 2010-08-11 NOTE — Assessment & Plan Note (Signed)
Summary: est-ck/fu/meds/cfb   Vital Signs:  Patient profile:   45 year old female Menstrual status:  irregular Height:      66 inches (167.64 cm) Weight:      225.01 pounds (102.28 kg) BMI:     36.45 Temp:     98.7 degrees F (37.06 degrees C) oral Pulse rate:   78 / minute BP sitting:   120 / 78  (right arm)  Vitals Entered By: Angelina Ok RN (July 15, 2010 10:15 AM) CC: Depression Is Patient Diabetic? No Pain Assessment Patient in pain? yes     Location: back, legs Intensity: 10 Type: aching Onset of pain  Constant Nutritional Status BMI of > 30 = obese  Have you ever been in a relationship where you felt threatened, hurt or afraid?No  Comments Numbness, spasms and throbbing in her feet.  Migaraines and not sleeping well.  Pain in leg fromback.  Saw a sports medicine doctor for her tendons in her shoulder.  Flat footed.  Family issues.   Primary Care Provider:  [name]  DO  CC:  Depression.  History of Present Illness: [name] comes in today for routine follow up. She has multiple complaints related to chronic pain issues.  1. Back pain, "getting worse" pain radiating into both hips.  2. Bilateral leg pain, lancing pain with numbness and parasthesias. Legs ache at night, thinks she has "restless legs". 3. c/o stress and depression problems. Chronic recurrent HA's "migraines". 4. concerned because she is gaining weight. 5. wants to review her labwork from her last visit.   Depression History:      The patient is having a depressed mood most of the day and has a diminished interest in her usual daily activities.        The patient denies that she feels like life is not worth living, denies that she wishes that she were dead, and denies that she has thought about ending her life.        Comments:  Occassional.  Taking meds for.   Preventive Screening-Counseling & Management  Alcohol-Tobacco     Alcohol drinks/day: 0     Smoking Status:  never  Allergies: 1)  ! Darvocet 2)  ! Neurontin 3)  ! Valium 4)  ! * Zofran 5)  ! Pcn 6)  ! Ultram 7)  ! 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 There appears to be a fatty inflitration of teh biceps muscle. She understands surgery is not an option. I will try a flector patch and see how she responds. Rec. Heat/ICE and a compression sleeve. Regfular ROM and strengthening as tolerated.  Problem # 2:  ANXIETY (ICD-300.00) This is an ongoing issue for [name]. We have talked in detail today about the results of her urine drug screen being negative for Alprazolam and positive for Ativan. she reports running out of her alprazolam and taking her mother's lorazepam. I told her that thsi was no safe and counselled her on this medication misuse problem.   Her updated medication list for this problem includes:    Alprazolam 2 Mg Tabs (Alprazolam) .Marland Kitchen... Take 1 tablet by mouth two times a day    Amitriptyline Hcl 25 Mg Tabs (Amitriptyline hcl) .Marland Kitchen... 1 by mouth qhs  Problem # 3:  PERIPHERAL NEUROPATHY (ICD-356.9) I think a large part of Lidwina's chronic pain is from a peripheral neuropathy. I have reviewed the treratment agreement in  detail with her for opiates. Will give one more try and limit her dose.  Problem # 4:  CHRONIC PAIN SYNDROME (ICD-338.4) I am now considering Kaisley a high risk patient for misuse or diversion of her opiate pain medications. I will keep her on a close pill count and screening randomly. She tells me that she is mostly using up her medications- I explained how for teh purposes of chronic pain that consitent dosing is most safe. She tells me she will from now on take as prescribed.  Problem # 5:  MIGRAINE HEADACHE (ICD-346.90) Will start trial of Topamax and f/u in 1 month. Her updated medication list for this problem includes:    Zomig 5 Mg Tabs (Zolmitriptan) .Marland Kitchen... Take 1 tablet by mouth once a  day as needed migraine    Percocet 5-325 Mg Tabs (Oxycodone-acetaminophen) .Marland Kitchen... Take 1 tablet by mouth every 6 hours as needed    Meloxicam 15 Mg Tabs (Meloxicam) .Marland Kitchen... 1 tab by mouth daily with food x 14 days, then as needed for pain  Complete Medication List: 1)  Nexium 40 Mg Cpdr (Esomeprazole magnesium) .... Take 1 tablet by mouth two times a day 2)  Zomig 5 Mg Tabs (Zolmitriptan) .... Take 1 tablet by mouth once a day as needed migraine 3)  Percocet 5-325 Mg Tabs (Oxycodone-acetaminophen) .... Take 1 tablet by mouth every 6 hours as needed 4)  Alprazolam 2 Mg Tabs (Alprazolam) .... Take 1 tablet by mouth two times a day 5)  Tricor 145 Mg Tabs (Fenofibrate) .... Take 1 tabelt by mouth once daily 6)  Transderm-scop Pt72 (Scopolamine base pt72) .... Apply every 48 hours as needed 7)  Soma 250 Mg Tabs (Carisoprodol) .... One tab by mouth three times a day prn 8)  Gyne-lotrimin 1 % Crea (Clotrimazole) .... One application as directed. pharmacy may substitute equivalent 9)  Voltaren 1 % Gel (Diclofenac sodium) .... Use twice daily, as needed for pain. 10)  Fluconazole 100 Mg Tabs (Fluconazole) .... Take one pill daily as needed for yeast infection/thrush 11)  Topamax 100 Mg Tabs (Topiramate) .... Take 1 tablet by mouth two times a day 12)  Prezista 400 Mg Tabs (Darunavir ethanolate) .... Take two tablets once a day with norvir and viread 13)  Viread 300 Mg Tabs (Tenofovir disoproxil fumarate) .... Take once a day with two prezistas and one norvir 14)  Combivir 150-300 Mg Tabs (Lamivudine-zidovudine) .... Take 1 tablet by mouth two times a day 15)  Norvir 100 Mg Tabs (Ritonavir) .... Take one tablet with the two prezistas and one viread 16)  Crestor 40 Mg Tabs (Rosuvastatin calcium) .... Take 1 tablet by mouth once a day (this replaces lipitor) 17)  Promethazine Hcl 50 Mg Tabs (Promethazine hcl) .... Take one half to one whole tablet up to four times daily as needed nausea 18)  Hibiclens 4 %  Liqd (Chlorhexidine gluconate) .... Use as directed two times a day 19)  Amitiza 8 Mcg Caps (Lubiprostone) .... Two times a day with meals 20)  Kristalose 20 Gm Pack (Lactulose) .... 20 gm q hs 21)  Domeperidome 10 Mg  .Marland KitchenMarland Kitchen. 1 by mouth three times a day 22)  Meloxicam 15 Mg Tabs (Meloxicam) .Marland Kitchen.. 1 tab by mouth daily with food x 14 days, then as needed for pain 23)  Cyanocobalamin 1000 Mcg/ml Soln (Cyanocobalamin) .... Administer 1.0 ml intramuscularly monthly 24)  Senokot Xtra 17.2 Mg Tabs (Sennosides) .... Take 1-2 tablet by mouth as needed for constipation 25)  Amitriptyline Hcl 25 Mg Tabs (Amitriptyline hcl) .Marland Kitchen.. 1 by mouth qhs 26)  Bactrim Ds 800-160 Mg Tabs (Sulfamethoxazole-trimethoprim) .... Take 1 tablet by mouth two times a day for 3 days 27)  Flector 1.3 % Ptch (Diclofenac epolamine) .... Apply to right upper arm daily for pain  Patient Instructions: 1)  Please schedule a follow-up appointment in 1 month- any provider/Irianna Gilday Attending preferred. Prescriptions: PERCOCET 5-325 MG TABS (OXYCODONE-ACETAMINOPHEN) Take 1 tablet by mouth every 6 hours as needed  #120 x 0   Entered and Authorized by:   [name]  DO   Signed by:   [name]  DO on 07/15/2010   Method used:   Print then Give to Patient   RxID:   [id number] ALPRAZOLAM 2 MG TABS (ALPRAZOLAM) Take 1 tablet by mouth three times a day  #60 x 0   Entered and Authorized by:   [name]  DO   Signed by:   [name]  DO on 07/15/2010   Method used:   Print then Give to Patient   RxID:   [id number] TOPAMAX 100 MG TABS (TOPIRAMATE) Take 1 tablet by mouth two times a day  #60 x 3   Entered and Authorized by:   [name]  DO   Signed by:   [name]  DO on 07/15/2010   Method used:   Electronically to        [facility] (retail)       [phone] [address]       [place], [place]  [id number]       Ph: [phone]       Fax: [phone]   RxID:    [id number] FLECTOR 1.3 % PTCH (DICLOFENAC EPOLAMINE) Apply to right upper arm daily for pain  #30 x 3   Entered and Authorized by:   [name]  DO   Signed by:   [name]  DO on 07/15/2010   Method used:   Electronically to        [facility] (retail)       [phone]       [place], [place]  [id number]       Ph: [phone]       Fax: [phone]   RxID:   [id number]    Orders Added: 1)  Est. Patient Level IV [71062]    Prevention & Chronic Care Immunizations   Influenza vaccine: Fluvax Non-MCR  (03/24/2010)    Tetanus booster: 02/03/2010: Tdap    Pneumococcal vaccine: Pneumovax  (08/17/2009)  Other Screening   Pap smear: Not documented    Mammogram: ASSESSMENT: Negative - BI-RADS 1^MM DIGITAL SCREENING  (07/10/2009)   Smoking status: never  (07/15/2010)  Lipids   Total Cholesterol: 201  (09/23/2009)   LDL: 123  (09/23/2009)   LDL Direct: Not documented   HDL: 35  (09/23/2009)   Triglycerides: 217  (09/23/2009)    SGOT (AST): 14  (06/09/2010)   SGPT (ALT): 12  (06/09/2010)   Alkaline phosphatase: 64  (06/09/2010)   Total bilirubin: 0.4  (06/09/2010)  Self-Management Support :   Personal Goals (by the next clinic visit) :      Personal LDL goal: 100  (06/17/2009)    Patient will work on the following items until the next clinic visit to reach self-care goals:     Medications and monitoring: take my medicines  every day, bring all of my medications to every visit  (07/15/2010)     Eating: drink diet soda or water instead of juice or soda, eat more vegetables, use fresh or frozen vegetables, eat foods that are low in salt, eat baked foods instead of fried foods, eat fruit for snacks and desserts, limit or avoid alcohol  (07/15/2010)     Activity: take a 30 minute walk every day  (07/15/2010)    Lipid self-management support: Written self-care plan, Education handout, Pre-printed educational material,  Resources for patients handout  (07/15/2010)   Lipid self-care plan printed.   Lipid education handout printed      Resource handout printed.

## 2010-08-23 ENCOUNTER — Ambulatory Visit (INDEPENDENT_AMBULATORY_CARE_PROVIDER_SITE_OTHER): Payer: Medicaid Other | Admitting: Internal Medicine

## 2010-08-23 ENCOUNTER — Encounter: Payer: Self-pay | Admitting: Internal Medicine

## 2010-08-23 DIAGNOSIS — E538 Deficiency of other specified B group vitamins: Secondary | ICD-10-CM

## 2010-08-23 DIAGNOSIS — M25569 Pain in unspecified knee: Secondary | ICD-10-CM

## 2010-08-23 DIAGNOSIS — G894 Chronic pain syndrome: Secondary | ICD-10-CM

## 2010-08-23 DIAGNOSIS — E785 Hyperlipidemia, unspecified: Secondary | ICD-10-CM

## 2010-08-23 LAB — LIPID PANEL
Cholesterol: 225 mg/dL — ABNORMAL HIGH (ref 0–200)
Total CHOL/HDL Ratio: 6.4 Ratio
Triglycerides: 300 mg/dL — ABNORMAL HIGH (ref <150)
VLDL: 60 mg/dL — ABNORMAL HIGH (ref 0–40)

## 2010-08-23 MED ORDER — CYANOCOBALAMIN 1000 MCG/ML IJ SOLN
1000.0000 ug | INTRAMUSCULAR | Status: DC
  Administered 2010-08-23 – 2011-10-25 (×6): 1000 ug via INTRAMUSCULAR

## 2010-08-23 MED ORDER — ALPRAZOLAM 2 MG PO TABS: 2.0000 mg | ORAL_TABLET | Freq: Three times a day (TID) | ORAL | Status: DC

## 2010-08-23 MED ORDER — OXYCODONE-ACETAMINOPHEN 5-325 MG PO TABS: 1.0000 | ORAL_TABLET | Freq: Four times a day (QID) | ORAL | Status: DC | PRN

## 2010-08-23 MED ORDER — DOXYCYCLINE MONOHYDRATE 100 MG PO TABS: 100.0000 mg | ORAL_TABLET | Freq: Two times a day (BID) | ORAL | Status: DC

## 2010-08-24 LAB — DRUG SCREEN, URINE
Barbiturate Quant, Ur: NEGATIVE
Creatinine,U: 54.9 mg/dL
Opiates: NEGATIVE
Propoxyphene: NEGATIVE

## 2010-08-24 NOTE — Assessment & Plan Note (Signed)
[name] discussion with her at last visit about opiates and controlled meds and needing to take responsibility for taking them as prescribed only. She had a negative UDS and unprescribed lorazapam on the screen. Today she comes in with her pill bottles, appropriate pill counts, "ready" to give a urine sample. She has no pain complaints today, and reports good control with current medication regimen. Will randomly screen her urine and continue to prescibe for now.

## 2010-08-24 NOTE — Assessment & Plan Note (Signed)
Patient is not taking the fenofibrate or the Crestor because of muscle pain. I advised her to continue to not take these medications. We will need to check a lipid panel in about 3 months. Will need to use caution with HIV meds and statin interactions. Could try pravastatin alone and see how she tolerates this.

## 2010-08-26 ENCOUNTER — Encounter: Payer: Self-pay | Admitting: Internal Medicine

## 2010-08-26 NOTE — Progress Notes (Signed)
  Subjective:    Patient ID: [name], female    DOB: 1966-06-27, 45 y.o.   MRN: [id number]  HPI Pt is 45 yo female with complex medical history outlined below who presents to [facility] with main concern of chronic headaches for which she needs refill. She also needs refill on her other medications, percocet and alprazolam. She has no new concerns today and denies any recent hospitalizations. She denies any episodes of chest pain, shortness of breath, fevers or chills, and no systemic symptoms of weight loss or night sweats. She also denies any new abdominal or urinary concerns. She reports compliance with her medications.    Review of Systems  Constitutional: Negative.   HENT: Negative.   Respiratory: Negative.   Cardiovascular: Negative.   Genitourinary: Negative.   Psychiatric/Behavioral: Negative.        Objective:   Physical Exam  Constitutional: She appears well-developed and well-nourished.  HENT:  Head: Normocephalic and atraumatic.  Right Ear: External ear normal.  Left Ear: External ear normal.  Nose: Nose normal.  Mouth/Throat: Oropharynx is clear and moist. No oropharyngeal exudate.  Eyes: Conjunctivae and EOM are normal. Pupils are equal, round, and reactive to light. Right eye exhibits no discharge. Left eye exhibits no discharge. No scleral icterus.  Cardiovascular: Normal rate, regular rhythm, normal heart sounds and intact distal pulses.  Exam reveals no gallop and no friction rub.   No murmur heard. Pulmonary/Chest: Effort normal and breath sounds normal. No respiratory distress. She has no wheezes. She has no rales. She exhibits no tenderness.  Abdominal: Soft. Bowel sounds are normal. She exhibits no distension and no mass. There is no tenderness. There is no rebound and no guarding.  Skin: Skin is warm and dry. No rash noted. She is not diaphoretic. No erythema. No pallor.  Psychiatric: She has a normal mood and affect. Her behavior is normal. Judgment and thought  content normal.          Assessment & Plan:

## 2010-08-26 NOTE — Patient Instructions (Signed)
Please schedule follow up appointment in 3 months

## 2010-08-31 ENCOUNTER — Other Ambulatory Visit: Payer: Self-pay | Admitting: Family Medicine

## 2010-08-31 ENCOUNTER — Ambulatory Visit (HOSPITAL_COMMUNITY)
Admission: RE | Admit: 2010-08-31 | Discharge: 2010-08-31 | Disposition: A | Discharge status: Home / Self Care | Payer: Medicaid Other | Source: Ambulatory Visit | Attending: Family Medicine | Admitting: Family Medicine

## 2010-08-31 ENCOUNTER — Encounter: Payer: Self-pay | Admitting: Family Medicine

## 2010-08-31 ENCOUNTER — Ambulatory Visit (INDEPENDENT_AMBULATORY_CARE_PROVIDER_SITE_OTHER): Payer: Medicaid Other | Admitting: Family Medicine

## 2010-08-31 DIAGNOSIS — M545 Low back pain, unspecified: Secondary | ICD-10-CM | POA: Insufficient documentation

## 2010-08-31 DIAGNOSIS — R269 Unspecified abnormalities of gait and mobility: Secondary | ICD-10-CM

## 2010-08-31 DIAGNOSIS — R300 Dysuria: Secondary | ICD-10-CM

## 2010-08-31 DIAGNOSIS — R52 Pain, unspecified: Secondary | ICD-10-CM

## 2010-08-31 DIAGNOSIS — M4310 Spondylolisthesis, site unspecified: Secondary | ICD-10-CM

## 2010-08-31 DIAGNOSIS — M79609 Pain in unspecified limb: Secondary | ICD-10-CM

## 2010-08-31 DIAGNOSIS — Q762 Congenital spondylolisthesis: Secondary | ICD-10-CM | POA: Insufficient documentation

## 2010-09-07 ENCOUNTER — Encounter: Payer: Self-pay | Admitting: Internal Medicine

## 2010-09-07 ENCOUNTER — Ambulatory Visit (INDEPENDENT_AMBULATORY_CARE_PROVIDER_SITE_OTHER): Payer: Medicaid Other | Admitting: Internal Medicine

## 2010-09-07 VITALS — BP 113/73 | HR 71 | Temp 96.6°F | Ht 66.0 in | Wt 225.7 lb

## 2010-09-07 DIAGNOSIS — E785 Hyperlipidemia, unspecified: Secondary | ICD-10-CM

## 2010-09-07 DIAGNOSIS — G43909 Migraine, unspecified, not intractable, without status migrainosus: Secondary | ICD-10-CM

## 2010-09-07 DIAGNOSIS — B20 Human immunodeficiency virus [HIV] disease: Secondary | ICD-10-CM

## 2010-09-07 MED ORDER — ALPRAZOLAM 2 MG PO TABS: ORAL_TABLET | ORAL | Status: DC

## 2010-09-07 MED ORDER — TERCONAZOLE 0.8 % VA CREA: 1.0000 | TOPICAL_CREAM | Freq: Every day | VAGINAL | Status: DC

## 2010-09-07 MED ORDER — CARISOPRODOL 250 MG PO TABS: 250.0000 mg | ORAL_TABLET | Freq: Three times a day (TID) | ORAL | Status: DC | PRN

## 2010-09-07 MED ORDER — FLUCONAZOLE 100 MG PO TABS: 100.0000 mg | ORAL_TABLET | Freq: Every day | ORAL | Status: DC | PRN

## 2010-09-07 MED ORDER — OXYCODONE-ACETAMINOPHEN 5-325 MG PO TABS: 1.0000 | ORAL_TABLET | Freq: Four times a day (QID) | ORAL | Status: DC | PRN

## 2010-09-07 MED ORDER — ZOLMITRIPTAN 5 MG PO TABS: 5.0000 mg | ORAL_TABLET | Freq: Every day | ORAL | Status: DC | PRN

## 2010-09-07 NOTE — Assessment & Plan Note (Signed)
Stable, continue same medication regimen.

## 2010-09-07 NOTE — Patient Instructions (Signed)
Please come back in 3 months for follow up appointment.

## 2010-09-07 NOTE — Progress Notes (Signed)
  Subjective:    Patient ID: [name], female    DOB: 01-31-1966, 45 y.o.   MRN: [id number]  HPI   patient is a 45 year old female with past medical history outlined below who presents to clinic for regular followup on her recent spider bite. Known from spider bite if healing well, and she has no bleeding 4 positive draining from the side. She denies any systemic symptoms of fevers and chills, night sweats, changes in appetite or weight. She endorses chronic migraine headaches and reports taking multiple medications in the past in order to control her headaches. She has not had lots of success with headache control. Currently she is on Topamax and Zomig but reports minimum relief of headaches. She would like to know if there is anything else she can take for better migraine control.   Review of Systems    per HPI Objective:   Physical Exam Constitutional: Vital signs reviewed.  Patient is a well-developed and well-nourished in no acute distress and cooperative with exam.  Ear: TM normal bilaterally Mouth: no erythema or exudates, MMM Eyes: PERRL, EOMI, conjunctivae normal, No scleral icterus.  Neck: Supple, Trachea midline normal ROM, No JVD, mass, thyromegaly, or carotid bruit present.  Cardiovascular: RRR, S1 normal, S2 normal, no MRG, pulses symmetric and intact bilaterally Pulmonary/Chest: CTAB, no wheezes, rales, or rhonchi Abdominal: Soft. Non-tender, non-distended, bowel sounds are normal, no masses, organomegaly, or guarding present.  Skin: Warm, dry and intact. No rash, cyanosis, or clubbing.  Psychiatric: Normal mood and affect. speech and behavior is normal. Judgment and thought content normal. Cognition and memory are normal.          Assessment & Plan:

## 2010-09-07 NOTE — Assessment & Plan Note (Signed)
Patient was advised to stop taking Crestor and TriCor per Dr. Phillips Odor, To side effects. We will reassess fasting lipid panel in 3 months and we'll readjust medication regimen as indicated.

## 2010-09-07 NOTE — Assessment & Plan Note (Signed)
Patient has chronic migraines. She has tried several different medications in the past concurrently Topamax and Zomig are not providing adequate relief for her migraines. I have discussed with her alternative therapies but unfortunately she has tried most of the ones I have suggested. I will he would Dr. Phillips Odor who is her primary care provider if she has any suggestions on any alternative therapies but I suspect that this will be difficult because she has already tried pretty much every medication I have recommended.

## 2010-09-09 NOTE — Assessment & Plan Note (Signed)
Summary: FU FOOT PAIN/MJD   Vital Signs:  Patient profile:   45 year old female Menstrual status:  irregular Weight:      222 pounds Pulse rate:   73 / minute BP sitting:   127 / 86  (left arm)  Vitals Entered By: Rochele Pages, RN CC: f/u rt foot pain   Referring Provider:  na Primary Provider:  [name]  DO  CC:  f/u rt foot pain.  History of Present Illness: 45yo female to office for f/u of R foot pain.  Continues to have intermittant pain along medial aspect of her foot at the Navicular.  Has intermittant swelling.  Continues to wear sports insoles with scaphoid pads which are comfortable, would like additional pair for different shoes.  When pain flares has difficulty with stationary bike & walking.  typically improves with rest & ice.  No significant pain or swelling today.  Also continues to have low back pain that radiates into her right leg.  Hx of b/l pars defect & spondylolisthesis.  Last x-rays 02/2009 showed 7mm slip, has not gotten any f/u x-rays since that time.  Has numbness & tingling radiating into both legs.  Denies any change in bowel or bladder, denies saddle anesthesia, denies fevers/chills, denies lower ext weakness.  Continues to see her PCP for management of chronic pain.  Allergies: 1)  ! Darvocet 2)  ! Neurontin 3)  ! Valium 4)  ! * Zofran 5)  ! Pcn 6)  ! Ultram 7)  ! Depakote  Review of Systems       per HPI  Physical Exam  General:  Well-developed,well-nourished,in no acute distress; alert,appropriate and cooperative throughout examination Msk:  HIPS: FROM without pain, neg log roll.    BACK: no midline tenderness, mild TTP along paraspinal muscles of L-spine b/l.  No significant spasm today.  Decreased flexion & extension of L-spine with pain.  Neg SLR b/l.  Normal lower ext strength.  Able to toe walk & heel walk.  FEET: pes planus, over pronation b/l.  Slight prominence of navicular on right, area slightly TTP today.  No surrounding  erythema, warmth, or bruising.  No other tenderness thru midfoot or forefoot.  No PF tenderness.  L foot without prominence of navicular, no midfoot or forefoot tenderness.  Hindfoot valgus - L>R. Pulses:  +2/4 DP & PT Neurologic:  sensation intact to light touch.   DTR +2/4 achilles & patella b/l   Impression & Recommendations:  Problem # 1:  FOOT PAIN, RIGHT (ICD-729.5)  - Rt foot pain, may be secondary to os navicularis. 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 to remain active, continued weight loss should help improve symptoms  Her updated medication list for this problem  includes:    Percocet 5-325 Mg Tabs (Oxycodone-acetaminophen) .Marland Kitchen... Take 1 tablet by mouth every 6 hours as needed    Soma 250 Mg Tabs (Carisoprodol) ..... One tab by mouth three times a day prn    Meloxicam 15 Mg Tabs (Meloxicam) .Marland Kitchen... 1 tab by mouth daily with food x 14 days, then as needed for pain  Orders: Radiology other (Radiology Other)  Problem # 4:  ACQUIRED SPONDYLOLISTHESIS (ICD-738.4) - Hx of 7mm slip on last imaging 02/2009, no f/u x-rays since that time - Check repeat lumbar x-ray today, will call at [phone] (h) or [phone] (c) with results  - ADDENDUM: X-rays completed show  bilateral L5 pars defect with grade I anterolisthesis L5 on S1 measuring about 7mm - no significant change compared to previous images in 2010.  Pt was notified of results  Complete Medication List: 1)  Nexium 40 Mg Cpdr (Esomeprazole magnesium) .... Take 1 tablet by mouth two times a day 2)  Zomig 5 Mg Tabs (Zolmitriptan) .... Take 1 tablet by mouth once a day as needed migraine 3)  Percocet 5-325 Mg Tabs (Oxycodone-acetaminophen) .... Take 1 tablet by mouth every 6 hours as needed 4)  Alprazolam 2 Mg Tabs (Alprazolam) .... Take 1 tablet by mouth two times a day 5)  Tricor 145 Mg Tabs (Fenofibrate) .... Take 1 tabelt by mouth once daily 6)  Transderm-scop Pt72 (Scopolamine base pt72) .... Apply every 48 hours as needed 7)  Soma 250 Mg Tabs (Carisoprodol) .... One tab by mouth three times a day prn 8)  Gyne-lotrimin 1 % Crea (Clotrimazole) .... One application as directed. pharmacy may substitute equivalent 9)  Voltaren 1 % Gel (Diclofenac sodium) .... Use twice daily, as needed for pain. 10)  Fluconazole 100 Mg Tabs (Fluconazole) .... Take one pill daily as needed for yeast infection/thrush 11)  Topamax 100 Mg Tabs (Topiramate) .... Take 1 tablet by mouth two times a day 12)  Prezista 400 Mg Tabs (Darunavir ethanolate) .... Take two tablets once a day with norvir and viread 13)  Viread 300 Mg Tabs (Tenofovir disoproxil fumarate) .... Take once a day with two prezistas and one norvir 14)  Combivir 150-300 Mg Tabs (Lamivudine-zidovudine) .... Take 1 tablet by mouth two times a day 15)  Norvir 100 Mg Tabs (Ritonavir) .... Take one tablet with the two prezistas and one viread 16)  Crestor 40 Mg Tabs (Rosuvastatin calcium) .... Take 1 tablet by mouth once a day (this replaces lipitor) 17)  Promethazine Hcl 50 Mg Tabs (Promethazine hcl) .... Take one half to one whole tablet up to four times daily as needed nausea 18)  Hibiclens 4 % Liqd (Chlorhexidine gluconate) .... Use as directed two times a  day 19)  Amitiza 8 Mcg Caps (Lubiprostone) .... Two times a day with meals 20)  Kristalose 20 Gm Pack (Lactulose) .... 20 gm q hs 21)  Domeperidome 10 Mg  .Marland KitchenMarland Kitchen. 1 by mouth three times a day 22)  Meloxicam 15 Mg Tabs (Meloxicam) .Marland Kitchen.. 1 tab by mouth daily with food x 14 days, then as needed for pain 23)  Cyanocobalamin 1000 Mcg/ml Soln (Cyanocobalamin) .... Administer 1.0 ml intramuscularly monthly 24)  Senokot Xtra 17.2 Mg Tabs (Sennosides) .... Take 1-2 tablet by mouth as needed for constipation 25)  Amitriptyline Hcl 25 Mg Tabs (Amitriptyline hcl) .Marland Kitchen.. 1 by mouth qhs 26)  Bactrim Ds 800-160 Mg Tabs (Sulfamethoxazole-trimethoprim) .... Take 1 tablet by mouth two times a day for 3 days 27)  Flector  1.3 % Ptch (Diclofenac epolamine) .... Apply to right upper arm daily for pain   Orders Added: 1)  Radiology other [Radiology Other] 2)  Est. Patient Level IV [84166] 3)  Sports Insoles [L3510] 4)  Scaphoid Pads [A6301]

## 2010-09-15 LAB — T-HELPER CELL (CD4) - (RCID CLINIC ONLY)
CD4 % Helper T Cell: 34 % (ref 33–55)
CD4 T Cell Abs: 570 uL (ref 400–2700)

## 2010-09-18 LAB — T-HELPER CELL (CD4) - (RCID CLINIC ONLY)
CD4 % Helper T Cell: 33 % (ref 33–55)
CD4 T Cell Abs: 570 uL (ref 400–2700)

## 2010-09-18 LAB — GLUCOSE, CAPILLARY: Glucose-Capillary: 102 mg/dL — ABNORMAL HIGH (ref 70–99)

## 2010-09-19 LAB — GLUCOSE, CAPILLARY: Glucose-Capillary: 76 mg/dL (ref 70–99)

## 2010-09-21 ENCOUNTER — Other Ambulatory Visit: Payer: Self-pay | Admitting: Internal Medicine

## 2010-09-21 LAB — T-HELPER CELL (CD4) - (RCID CLINIC ONLY)
CD4 % Helper T Cell: 31 % — ABNORMAL LOW (ref 33–55)
CD4 T Cell Abs: 510 uL (ref 400–2700)

## 2010-09-24 LAB — T-HELPER CELL (CD4) - (RCID CLINIC ONLY): CD4 T Cell Abs: 570 uL (ref 400–2700)

## 2010-09-27 LAB — T-HELPER CELL (CD4) - (RCID CLINIC ONLY): CD4 T Cell Abs: 490 uL (ref 400–2700)

## 2010-10-06 LAB — GLUCOSE, CAPILLARY: Glucose-Capillary: 95 mg/dL (ref 70–99)

## 2010-10-06 LAB — T-HELPER CELLS (CD4) COUNT (NOT AT ARMC)
CD4 % Helper T Cell: 30 % — ABNORMAL LOW (ref 33–55)
CD4 T Cell Abs: 460 uL (ref 400–2700)

## 2010-10-07 LAB — T-HELPER CELLS (CD4) COUNT (NOT AT ARMC): CD4 % Helper T Cell: 31 % — ABNORMAL LOW (ref 33–55)

## 2010-10-12 LAB — GLUCOSE, CAPILLARY: Glucose-Capillary: 109 mg/dL — ABNORMAL HIGH (ref 70–99)

## 2010-10-18 LAB — T-HELPER CELLS (CD4) COUNT (NOT AT ARMC): CD4 T Cell Abs: 590 uL (ref 400–2700)

## 2010-10-27 ENCOUNTER — Ambulatory Visit: Payer: Medicaid Other

## 2010-11-03 ENCOUNTER — Other Ambulatory Visit: Payer: Medicaid Other

## 2010-11-09 ENCOUNTER — Ambulatory Visit (INDEPENDENT_AMBULATORY_CARE_PROVIDER_SITE_OTHER): Payer: Medicaid Other | Admitting: Internal Medicine

## 2010-11-09 ENCOUNTER — Encounter: Payer: Self-pay | Admitting: Internal Medicine

## 2010-11-09 DIAGNOSIS — G894 Chronic pain syndrome: Secondary | ICD-10-CM

## 2010-11-09 DIAGNOSIS — N390 Urinary tract infection, site not specified: Secondary | ICD-10-CM | POA: Insufficient documentation

## 2010-11-09 DIAGNOSIS — D539 Nutritional anemia, unspecified: Secondary | ICD-10-CM

## 2010-11-09 DIAGNOSIS — B20 Human immunodeficiency virus [HIV] disease: Secondary | ICD-10-CM

## 2010-11-09 MED ORDER — ZOLMITRIPTAN 5 MG PO TABS: 5.0000 mg | ORAL_TABLET | Freq: Every day | ORAL | Status: DC | PRN

## 2010-11-09 MED ORDER — SULFAMETHOXAZOLE-TRIMETHOPRIM 800-160 MG PO TABS: 1.0000 | ORAL_TABLET | Freq: Two times a day (BID) | ORAL | Status: AC

## 2010-11-09 MED ORDER — TERCONAZOLE 0.8 % VA CREA: 1.0000 | TOPICAL_CREAM | Freq: Every day | VAGINAL | Status: DC

## 2010-11-09 MED ORDER — OXYCODONE-ACETAMINOPHEN 5-325 MG PO TABS: 1.0000 | ORAL_TABLET | Freq: Four times a day (QID) | ORAL | Status: DC | PRN

## 2010-11-09 MED ORDER — CARISOPRODOL 250 MG PO TABS: 250.0000 mg | ORAL_TABLET | Freq: Three times a day (TID) | ORAL | Status: DC | PRN

## 2010-11-09 MED ORDER — ALPRAZOLAM 2 MG PO TABS: ORAL_TABLET | ORAL | Status: DC

## 2010-11-09 NOTE — Assessment & Plan Note (Signed)
Based on her symptoms this appears to be secondary to urinary tract infection. We will obtain urinalysis today and will treat with Bactrim double strength. Patient was advised if her symptoms are not improved in several days or if they get worse she needs to call the clinic back to that we can further evaluate.

## 2010-11-09 NOTE — Progress Notes (Signed)
Addended by: Youlanda Roys on: 11/09/2010 03:49 PM   Modules accepted: Orders

## 2010-11-09 NOTE — Patient Instructions (Signed)
Please schedule follow up appointment in 3 months

## 2010-11-09 NOTE — Assessment & Plan Note (Addendum)
Pain is well controlled on current medications and will provide regular refills. Patient has not violated the contract to date.

## 2010-11-09 NOTE — Assessment & Plan Note (Signed)
Continue current medications as per ID specialist. Patient tolerating medications well.

## 2010-11-09 NOTE — Progress Notes (Signed)
  Subjective:    Patient ID: [name], female    DOB: 06-25-1966, 45 y.o.   MRN: [id number]  HPI Patient is 45 year old female with past medical history outlined below who presents to clinic with main concern of 2 day history of urinary urgency and feelings of incomplete voiding, associated with subjective fevers and chills. She has had similar episodes in the past and was diagnosed with UTI at which point she was treated with antibiotics in the episodes resolved. In addition she reports left groin area boil which appears 2 weeks ago. It drained spontaneously and right now appears as if it's healing however she can still feel tenderness in the area. She also needs refills on her pain medications, tells me that her pain is well-controlled on her current regimen. She denies recent sicknesses or hospitalizations, no episodes of chest pain, no abdominal concerns. No other systemic symptoms such as weight loss and night sweats.   Review of Systems Per history of present illness    Objective:   Physical Exam Constitutional: Vital signs reviewed.  Patient is a well-developed and well-nourished in no acute distress and cooperative with exam. Alert and oriented x3.  Neck: Supple, Trachea midline normal ROM, No JVD, mass, thyromegaly, or carotid bruit present.  Cardiovascular: RRR, S1 normal, S2 normal, no MRG, pulses symmetric and intact bilaterally Pulmonary/Chest: CTAB, no wheezes, rales, or rhonchi Abdominal: Soft. Non-tender, non-distended, bowel sounds are normal, no masses, organomegaly, or guarding present, no CVA Skin: Warm, dry and intact, left groin area boil present 1 cm in diameter slightly tender to palpation, no pus or bleeding from the boil noted Psychiatric: Normal mood and affect. speech and behavior is normal. Judgment and thought content normal. Cognition and memory are normal.          Assessment & Plan:

## 2010-11-16 NOTE — Assessment & Plan Note (Signed)
[name]                         GASTROENTEROLOGY OFFICE NOTE   [name], [name]                         MRN:          [id number]  DATE:02/09/2007                            DOB:          Nov 11, 1965    [name] is an unfortunate, 45 year old female with Stage 1-A high-  grade non-Hodgkin's lymphoma, being treated with chemotherapy, which I  believe is completed earlier this year.  She additionally has positive  HIV status, without clinical AIDS, type 2 diabetes, and a history of  recurrent C4-C5 dermatome herpes zoster infection.  She additionally has  known diabetic peripheral neuropathy and gastroparesis.  She has been  followed by Dr. Victorino Dike for the last year and has been treated with  a variety of different medications, including PPI therapy, Phenergan,  Reglan, Amitiza and Carafate.   She currently is on a regimen of Reglan 10 mg four times a day and  Nexium 40 mg twice a day.  She follows a diabetic diet, but continues to  have episodes of rather severe nausea and vomiting, which last three to  five days in duration, with associated increase in her acid reflux  symptoms.  She also has what sounds like IBS with abdominal gas,  bloating, crampy lower abdominal pain.  She has had previous colonoscopy  and endoscopy by Dr. Corinda Gubler in July of 2003, both of which were  unremarkable, except for mild diverticulosis.  Patient is currently  complaining of some intermittent lower abdominal pain and wants  antibiotics for diverticulitis.   Review of her chart is somewhat extensive.  She has had several gastric  emptying scans, which have shown delayed gastric emptying of a mild  degree.  CT scan of the abdomen per Dr. Cyndie Chime in the past has  apparently been unremarkable.  She is status post cholecystectomy.   LABORATORY DATA:  Showed a normal serum gastrin level, liver function  test, and CBC.   She is on at least 20 different  medications, to include:   1. Valtrex 500 mg daily.  2. Sustiva 600 mg daily.  3. Cymbalta 80 mg a day.  4. Reglan 10 mg four times a day.  5. Diflucan 100 mg a day.  6. Zomig 2.5 mg p.r.n.  7. Advair inhalers daily.  8. Mirapex 1.5 mg t.i.d.  9. Lipitor 40 mg a day.  10.Combivir inhaler 150-300 twice a day.  11.Atenolol 100 mg a day.  12.Ativan 2 mg t.i.d.  13.Metformin 500 mg twice a day.  14.Coumadin 10 mg a day.  15.Nexium 40 mg twice a day.  16.Lunesta at bedtime.  17.Lupron injections every three months.   She had had reaction in the past to NEURONTIN, VALIUM, DARVOCET, and  ZOFRAN.   In addition to the above-mentioned problems, patient has chronic  migraine headaches, chronic depression and marked obesity.  She  allegedly has lost 30 pounds voluntarily over the last year.   On reviewing her chart, I cannot see why the patient is anticoagulated.  I think this relates to a Port-A-Cath that she has in place because of  her  recurrent skin infections and perhaps for her chemotherapy.   Other diagnoses are restless leg syndrome and she is on a rather large  dose of Mirapex.   EXAM TODAY:  Shows her to be nontoxic and a healthy-appearing white  female, in no acute distress.  She weighs 270 pounds and blood pressure is 116/70 and pulse was 68 and  regular.  I could not appreciate stigmata of chronic liver disease.  She had a massively obese abdomen with no definite organomegaly, masses,  and certainly no significant tenderness.  Bowel sounds were present and  appeared normal.   ASSESSMENT:  This is a very complex and difficult patient with multiple  medical problems.  I would agree that she most likely has a rather  marked gastroparesis, related to her diabetes, which may be exacerbated  by some of her medications.  She seems to be refractory to almost all  medical management.   RECOMMENDATIONS:  1. Trial of erythromycin 200 mg twice a day, to be increased to 400 mg       twice a day as tolerated.  2. Continue Reglan 10 mg before meals and at bedtime, along with 40 mg      of Nexium twice a day.  3. Gastroparesis diet reviewed with patient.  4. We will ask Dr. Alycia Rossetti to see her at [facility] for      consideration of further GI mobility tests and perhaps gastric      pacing.  5. Ask Dr. Alycia Rossetti also to review her medicines and see if some of these      can be discontinued.  I would think that perhaps Mirapex, which is      a dopamine agonist, may be related to some of her symptoms.  She      could not tolerate Amitiza in the past because of severe nausea      from this.  As mentioned above, she is on a variety of antiviral      agents.  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   I have sent this note to Dr. Cyndie Chime also for his review and Dr. Darlina Sicilian.     Sherry Rea. Jarold Motto, MD, Caleen Essex, FAGA  Electronically Signed    DRP/MedQ  DD: 02/09/2007  DT: 02/09/2007  Job #: [id number]   cc:   Fransisco Hertz, M.D.  Genene Churn. Cyndie Chime, M.D.

## 2010-11-16 NOTE — Assessment & Plan Note (Signed)
[name]                         [name]   NAME:Zill, [name]                         MRN:          [id number]  DATE:12/06/2006                            DOB:          1965-07-12    A very nice patient, [name], comes in.  She says she has been  dealing with GERD a great deal.  She has been having problems everyday  almost, sometimes more frequently than that.  She has a motility problem  with gastroparesis.  We have talked about in the past.  She has  diabetes.  She is status post cholecystectomy.  She has HIV, on  antiviral therapy.  She has a history of non-Hodgkin's lymphoma, and she  has severe obesity problems.   She is on multiple medications, including Valtrex, Sustiva, Cymbalta,  Reglan, Diflucan, Zomig, Advair, Mirapex, Lipitor, Lopid, Combivent,  atenolol, Ativan, glipizide, metformin, Coumadin, Lunesta, Lupron  injections, albuterol inhaler.   We have done quite an extensive workup on her for regurgitation.  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  Maybe she needs bariatric  obesity surgical procedure to see if this would correct some of her  problems, but in the meantime, I think she should try some Carafate  suspension q.i.d., and increase her Reglan to 20 mg q.i.d., and call us  if she has any side effects with Reglan.  She also received some  Phenergan; she wanted some refills of that.  I told her to follow up  with one of our physicians in the future after my retirement.  She is a  very nice lady, and has a really complex medical history for someone so  young.  She is also status post cholecystectomy.  It should be noted she  has lost a little weight, and she down now to 272, which is probably  helpful to her.  We will see how the new medication do as far as her  motility is concerned.     Ulyess Mort, MD  Electronically Signed    SML/MedQ  DD: 12/06/2006  DT: 12/06/2006  Job #: [id number]

## 2010-11-16 NOTE — Assessment & Plan Note (Signed)
OFFICE VISIT   [name], [name]  DOB:  March 23, 1966                                       10/03/2007  WUXLK#:[id number]   The patient is a 45 year old female with a prior history of left femoral  DVT.  This was 1 year ago.  Since that time she has had problems with  chronic leg swelling in both legs.  This has been present since  September of 2008.  She states that the swelling is somewhat improved  with wearing compression stockings.  She has also noticed a prominent  vein over her right knee area.  She has had constant leg pain 24 hours a  day, 7 days a week.  She also has a history of restless leg syndrome.  She states that the stockings do help with the pain somewhat but did not  completely relieve it.  It does also help with the swelling.   PAST MEDICAL HISTORY:  Is otherwise remarkable for shingles, HIV  positive, depression, peptic ulcer disease with endoscopy in 1999,  esophagitis, obesity, migraines, asthma.  She also has borderline  diabetes.  She denies history of hypertension.   MEDICATIONS:  1. Nexium 40 mg twice a day.  2. Humibid maximum strength p.r.n.  3. Clarinex 5 mg p.r.n.  4. Zolmitriptan one tablet p.r.n.  5. Cymbalta 30 mg once a day.  6. Combivir 150/300, two tablets once a day.  7. Sustiva 600 mg once a day in the evening.  8. Diflucan once a day.  9. Mirapex 1.5 mg three times a day.  10.Percocet p.r.n.  11.Advair 100/50 once a day.  12.Alprazolam 2 mg three times a day.  13.Lipitor 80 mg once a day.  14.TriCor 145 mg once a day.  15.Transderm scopolamine patch p.r.n.  16.Flexeril 5 mg once a day.  17.Reglan 10 mg two tablets four times a day.  18.Compazine one tablet two times a day.  19.Valtrex 1 g once a day.  20.Lidoderm patch p.r.n.   She has side effects from Valium which cause migraines, from Zofran and  Darvocet which cause her to have migraines and vomit, Neurontin which  causes vomiting.   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   FAMILY HISTORY:  Mother had a history of PVD and hypertension.   SOCIAL HISTORY:  She is divorced, has no children.  She lives at home.   REVIEW OF SYSTEMS:  She has had some recent appetite loss and weight  loss.  She is 5 feet 5 inches, 265 pounds.  She has shortness of breath  with exertion.  She has a productive cough with bronchitis, asthma and  wheezing.  She has history of reflux, hiatal hernia, chronic abdominal  pain, diarrhea and constipation.  She has some urinary frequency.  She  has migraine and cluster headaches and tension headaches.  She has  multiple joint arthritis and muscle pain.  She has depression and  anxiety.  She has had shingles multiple times.   PHYSICAL EXAM:  Blood pressure is 126/78 in the left arm, heart rate 76  and regular.  Lower Extremities:  She has 2+ femoral, popliteal,  dorsalis pedis and posterior tibial pulses bilaterally.  She has  prominent appearing greater saphenous veins bilaterally which are easily  palpable on  standing.  She has no ulcerations or discoloration of the  feet.  She had a venous duplex exam today which showed no evidence of  deep or superficial venous thrombophlebitis.  She did have a Baker's  cyst in both legs bilaterally.  She had no incompetence of her greater  saphenous or deep veins bilaterally.   [name] has chronic leg swelling, she does have prominent greater  saphenous veins bilaterally; however, neither of these veins are  incompetent.  I do not believe it is in her best interest to remove her  saphenous vein or ablate it due to the fact that it does not have  incompetent valves and is probably not the primary contributor to her  problem.  She has gotten some benefit from compression stockings and I  believe this is probably going to be the best option for her.  I did  give her a renewed prescription for compression  stockings today, 20-25  mmHg, since she stated the stockings she had gotten previously had  become loose.  She will follow up on an as-needed basis if she needs  renewal of her stocking prescription.  I believe that most of her leg  swelling is multifactorial and certainly some weight loss would help  with this as well.   [name], MD  Electronically Signed   CEF/MEDQ  D:  10/04/2007  T:  10/04/2007  Job:  [id number]   cc:   Edsel Petrin, D.O.

## 2010-11-16 NOTE — Op Note (Signed)
NAMEPAULENE, [name]                  ACCOUNT NO.:  [id number]   MEDICAL RECORD NO.:  [id number]          PATIENT TYPE:  AMB   LOCATION:  SDC                           FACILITY:  [facility]   PHYSICIAN:  Roseanna Rainbow, M.D.DATE OF BIRTH:  1965/10/31   DATE OF PROCEDURE:  DATE OF DISCHARGE:                               OPERATIVE REPORT   PREOPERATIVE DIAGNOSIS:  Abnormal uterine bleeding.   POSTOPERATIVE DIAGNOSIS:  Abnormal uterine bleeding.   PROCEDURES:  Dilatation and curettage, diagnostic hysteroscopy, NovaSure  ablation.   SURGEON:  Roseanna Rainbow, MD   ANESTHESIA:  Laryngeal mask airway, paracervical block.   PATHOLOGY:  Endometrial curettings.   ESTIMATED BLOOD LOSS:  Minimal.   COMPLICATIONS:  None.   PROCEDURE:  The patient was taken to the operating room with an IV  running.  Laryngeal mask airway was placed.  She was placed in the  dorsal lithotomy position and prepped and draped in the usual sterile  fashion.  After a time-out had been completed, a sterile speculum was  placed in the patient's vagina.  The anterior lip of the cervix was  infiltrated with 2 mL of 1% lidocaine.  The single-tooth tenaculum was  then applied to this location.  A 4 mL of 1% lidocaine were then  injected at 4 and 7 o'clock to produce a paracervical block.  The  cervical canal length was then measured with a Hegar dilator.  The  cervix was then dilated with [facility] dilators.  The uterus sounded to 8  cm.  A diagnostic hysteroscopy was then performed.  There are no  discrete lesions noted.  The hysteroscope was then removed.  A sharp  curettage was performed with minimal curettings retrieved.  The NovaSure  device was then introduced into the uterus.  The device was then seated.  The cavity width was assessed.  The cavitary integrity test was then  performed and passed.  The ablation cycle was then initiated and  completed.  The device was then removed.  The hysteroscope was then  reintroduced into the uterus.  Good results were noted.  The  hysteroscope was then removed.  At the closure of the procedure, the  instrument and pack counts were said to be correct x2.  The patient was  taken to the PACU awake and in stable condition.      Roseanna Rainbow, M.D.  Electronically Signed     LAJ/MEDQ  D:  03/27/2008  T:  03/27/2008  Job:  [id number]

## 2010-11-16 NOTE — Procedures (Signed)
DUPLEX DEEP VENOUS EXAM - LOWER EXTREMITY   INDICATION:  Bilateral lower extremity edema and history of left leg  DVT.   HISTORY:  Edema:  Patient complains of bilateral lower extremity edema.  Trauma/Surgery:  No.  Pain:  Patient is in constant bilateral lower extremity pain.  PE:  No.  Previous DVT:  Last year, the patient had a left groin DVT.  She does  know precisely where it was, although a previous duplex revealed that it  had resolved, according to the patient.  Anticoagulants:  Patient was on Coumadin for her previous DVT.  Other:  Patient was on chemotherapy and radiation therapy four years  ago.    DUPLEX EXAM:                CFV   SFV   PopV  PTV    GSV                R  L  R  L  R  L  R   L  R  L  Thrombosis    O  o  o  o  o  o  o   o  o  o  Spontaneous   +  +  +  +  +  +  +   +  +  +  Phasic        +  +  +  +  +  +  +   +  +  +  Augmentation  +  +  +  +  +  +  +   +  +  +  Compressible  +  +  +  +  +  +  +   +  +  +  Competent     +  +  +  +  +  +  +   +  +  +   Legend:  + - yes  o - no  p - partial  D - decreased    IMPRESSION:  1. No evidence of deep or superficial venous thrombus.  2. No evidence of baker's cysts bilaterally.  3. No significant venous incompetence bilaterally.     _____________________________  Janetta Hora Fields, MD   MC/MEDQ  D:  10/03/2007  T:  10/03/2007  Job:  [id number]

## 2010-11-18 ENCOUNTER — Ambulatory Visit: Payer: Medicaid Other | Admitting: Infectious Disease

## 2010-11-19 NOTE — Assessment & Plan Note (Signed)
[facility]                           GASTROENTEROLOGY OFFICE NOTE   NAME:Siemon, KENZLIE DISCH                         MRN:          [id number]  DATE:05/17/2006                            DOB:          1966-05-25    Anniebelle comes in, says she is still having some vomiting before and after  meals, especially over the last 3 months, 3 times a week she quantifies it.  She says she is very nauseated.  She quit taking the Phenergan because it  was not helping any more.  She says she is also having stomach pains, as  well as some intermittent diarrhea and constipation.  She has had a trial of  omeprazole per her insurance company, but would like Nexium because she  thinks it is more helpful.   MEDICATIONS:  1. Valtrex.  2. Sustiva.  3. Cymbalta.  4. Reglan 10 mg four times a day.  5. Diflucan.  6. Zomig.  7. Advair.  8. Mirapex.  9. Amitiza 24 mcg b.i.d.  10.Lipitor.  11.Lopid.  12.Combivir.  13.Atenolol.  14.Ativan.  15.Glipizide.   PHYSICAL EXAMINATION:  Her weight was 301, which is the heaviest that she  has been since she has been in this office that I can see.  Her blood  pressure is 120/76, pulse 88 and regular.  OROPHARYNX:  Negative.  NECK:  Negative.  CHEST:  Clear.  HEART:  Revealed a regular rhythm with significant murmur.  ABDOMEN:  Obese, soft, no masses or organomegaly, nontender.   IMPRESSION:  1. Gastrointestinal dysmotility disorder with component of irritable bowel      syndrome, probable component of diabetic gastroparesis.  2. Non-Hodgkin's lymphoma.  3. Human immunodeficiency virus positive, on antiviral therapy.  4. Adult-onset diabetes, on oral agents.  5. Morbid obesity.  6. Status post cholecystectomy.   RECOMMENDATIONS:  Continue the same medicines.  We also gave her some Tigan  suppositories to see if this would give her any relief from her vomiting.  I  told her that she could take the Nexium if she can get that with  her  Medicaid, that would be fine with me.  I told her to let us know if she  continues to have difficulty.  I also suggested maybe she should go to  [facility] if her symptoms persist.  What is interesting, however, is that  her gastric emptying study really was not terribly abnormal, so I do not  know whether some of this is functional, whether it is medication-induced,  or what, but it is odd and difficult.  It is amazing, however, though that  she certainly had not lost any weight, and as symptomatic as she is, it  would seem that she would not be eating as much.     Ulyess Mort, MD  Electronically Signed    SML/MedQ  DD: 05/18/2006  DT: 05/19/2006  Job #: [id number]

## 2010-11-19 NOTE — Assessment & Plan Note (Signed)
[name]                         GASTROENTEROLOGY OFFICE NOTE   NAME:[name], [name]                         MRN:          [id number]  DATE:10/11/2006                            DOB:          08-29-1965    Poor [name] comes in complaining again of some reflux.  Says the last 2  weeks she has been having vomiting and severe nausea.   PHYSICAL EXAMINATION:  VITAL SIGNS:  On physical, she weighs 277, which  is a weight loss of approximately 5 pounds in the past month and a half.  Blood pressure is 108/78, pulse 76 and regular.  ABDOMEN:  Soft, obese, nontender to palpation.   IMPRESSION:  1. Gastroesophageal reflux disease with dysmotility.  2. Diabetes, possibly diabetic gastroparesis.  3. Status post cholecystectomy.  4. History of immune deficiency virus positive, on antiviral therapy.  5. History of non-Hodgkin lymphoma.  6. Moderate to severe obesity.   RECOMMENDATIONS:  To continue medications and take some Phenergan.  I  gave her a prescription for some, some Bactrim for some boils on her  rectum, which she says has helped her in the past.  I gave her the  Phenergan 25 mg to take one to three daily.     Ulyess Mort, MD  Electronically Signed    SML/MedQ  DD: 10/11/2006  DT: 10/12/2006  Job #: [id number]

## 2010-11-19 NOTE — Discharge Summary (Signed)
[name], [name]                            ACCOUNT NO.:  [id number]   MEDICAL RECORD NO.:  [id number]                   PATIENT TYPE:  INP   LOCATION:  0283                                 FACILITY:  [facility]   PHYSICIAN:  Genene Churn. Cyndie Chime, M.D.          DATE OF BIRTH:  December 23, 1965   DATE OF ADMISSION:  10/15/2003  DATE OF DISCHARGE:  10/18/2003                                 DISCHARGE SUMMARY   HISTORY:  A 45 year old HIV-positive woman diagnosed with high-grade non-  Hodgkin lymphoma localized to a left axillary lymph node in November 2004.  She has been on treatment with a combination of CHOP chemotherapy plus  Rituxan immunotherapy.  She has stage IA disease.  She completed her fourth  and final planned cycle of treatment on October 07, 2003.  The plan is to refer  her for involved field radiation when she recovered from this cycle.   She presented on the day of admission with a 24-hour history of fever up to  103 degrees with associated chills, dyspnea, and chest discomfort.  She was  admitted for further evaluation.   INITIAL PHYSICAL EXAMINATION:  Obese Caucasian woman in no acute distress.  Pulse 128 and regular, blood pressure 144/79, respirations 20, temperature  98.1.  Pertinent physical findings include total alopecia from chemotherapy.  No erythema or exudate in the oropharynx.  Clear lungs.  No cardiac murmurs.  Port-A-Cath infusion device right subclavian position.  No erythema,  tenderness, or exudate.  Abdomen soft and nontender.  Extremities:  An  erythematous, tender, nodular 2 x 2 cm on the right forearm overlying a  thrombosed vein used for her initial chemotherapy treatment.  Subtle  lymphedema developing of the left upper extremity, and the left breast with  some __________ changes on the skin of the left breast.   HOSPITAL COURSE:  Laboratory obtained in our office showed a hemoglobin of  8.1, total white count of 0.1 with absolute neutrophil 0.1 and  platelet  count of 116,000.   Cultures were obtained and she was started on broad-spectrum antibacterial  antibiotics.  Prophylactic Diflucan and Septra were continued.  A chest  radiograph showed no infiltrates.  Of note, she had a restaging evaluation  of her lymphoma on the day of admission which had been previously planned as  an outpatient.  This included a CT scan of the chest, abdomen, and pelvis as  well as a PET scan.  This shows a complete response to her initial treatment  with no residual lymphoma and no new areas of abnormalities.   She never had a temperature higher than 100 degrees for the entire admission  and was afebrile for greater than 24 hours prior to discharge.  She did  receive Neulasta growth factor support on the day after most recent  chemotherapy treatment.  Despite this, she did have a brief period of  absolute granulocytopenia.  However, counts  recovered promptly while  hospitalized and total white count was 2500 with neutrophils 68% on April  16th.   Cultures remained sterile at 72 hours and I felt she was stable for  discharge at that time to continue oral outpatient antibiotics.  Hemoglobin  was 7.9.  She received 2 units of packed cells but did not have a  significant rise in her hemoglobin which was 7.8 at time of discharge.  Reason for this is unclear but I suspect that this is still some bone marrow  suppression from the recent chemo.  Platelet count got as low as 97,000 and  was up to 111,000 on day of discharge.   Additional data obtained during this admission included urine analysis which  was normal, urine culture which was sterile.  Blood chemistries done as an  outpatient through my office were normal.   An electrocardiogram showed sinus tachycardia, otherwise normal.   She developed some atypical pain in her left hand including the wrist and  fingers.  Not clear whether this was related to her active infection or just  a tendonitis.   There was minimal soft tissue swelling without erythema on  the dorsum of her left hand and some fusiform swelling of the proximal  digits of her left hand.  This subsided but not completely gone at time of  discharge.   The inflammatory nodule on the right forearm regressed significantly on  parenteral antibiotics and was down to about 4 x 4 mm at time of discharge  and was starting to show signs of resolution.  This appears to be a  localized area of phlebitis over a thrombosed vein used for her initial  chemo.   There were no complications.   CONSULTATIONS:  None.   PROCEDURES:  Blood transfusion.   DIAGNOSES:  1. Fever and absolute granulocytopenia in an immunocompromised host on     chemotherapy for lymphoma.  2. Stage IIA high-grade non-Hodgkin lymphoma now in complete remission.  3. Human immunodeficiency virus positive.  4. History of depression.  5. History of chronic migraines.   DISPOSITION:  Condition stable at time of discharge.  Resume regular  activity, regular diet.  She will follow up with me in one month.  She  already has a scheduled appointment to see Dr. Chipper Herb, radiation  oncology, for treatment planning with respect to involved field radiation to  her left axilla.  Within the next two weeks, counts should be fully  recovered and she can have her Port-A-Cath infusion device removed.   MEDICATIONS ON DISCHARGE:  1. Cipro 500 mg b.i.d. x7 days.  2. Diflucan 100 mg daily.  3. Septra DS one p.o. b.i.d. Monday, Wednesday, Friday.  4. Protonix 40 mg daily.  5. Mirapex 0.5 mg t.i.d.  6. Glucotrol 5 mg daily.  7. Cymbalta 20 mg daily.  8. Claretin 10 mg daily p.r.n.  9. Flonase nasal spray p.r.n.  10.      Sustiva 600 mg b.i.d.  11.      Combivir 150/300 mg b.i.d.  12.      Zomig 2.5 mg p.r.n. migraine headaches.  13.      Tylox one to two q.6 h. p.r.n. pain.                                               Genene Churn. Cyndie Chime, M.D.  JMG/MEDQ   D:  10/18/2003  T:  10/18/2003  Job:  [id number]   cc:   Leonie Man, M.D.  [address]  [address]  [place]  [place] [address]  Fax: [phone]   Maryln Gottron, M.D.  [address] [facility]  [place]  [place] [address]  Fax: [phone]   Fransisco Hertz, M.D.  [address]  [place] [address]  Fax: [phone]   Will Averett, M.D.  [address], [place] [address]  Fax: [phone]   [facility]

## 2010-11-19 NOTE — Op Note (Signed)
NAMEALAZIA, CROCKET                            ACCOUNT NO.:  [id number]   MEDICAL RECORD NO.:  [id number]                   PATIENT TYPE:  AMB   LOCATION:  DSC                                  FACILITY:  [facility]   PHYSICIAN:  Leonie Man, M.D.                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   PROCEDURE:  Port-A-Cath implantation.   SURGEON:  Leonie Man, M.D.   ASSISTANT:  Nurse.   ANESTHESIA:  MAC using lidocaine 1% plain, Marcaine 0.5% with epinephrine  1:200,000.   INDICATIONS FOR PROCEDURE:  This patient is a 45 year old woman with newly  diagnosed B cell lymphoma and also with HIV, who is currently being prepared  for chemotherapy.  She has very poor venous access and has been referred for  Port-A-Cath implantation so as to facilitate her chemotherapy treatments.   DESCRIPTION OF PROCEDURE:  Following the induction of satisfactory sedation,  the patient is positioned supinely. A gel roll is placed between the  shoulders and the bed is positioned in the Trendelenburg position.  The  right breast is strapped using a tape, strapped down so as to have access to  the shoulder and neck in this obese patient.  The neck and the shoulder are  prepped and draped to be included in a sterile operative field.  Using 1%  lidocaine plain, the right subclavian region is infiltrated with 1%  lidocaine and a subclavian stick is made into the right subclavian vein.  A  guide wire is then threaded into the central venous system under  fluoroscopic guidance.  This is positioned with its tip in the atrial  venacaval junction.  I then infiltrated the anterior chest wall with  Marcaine with epinephrine and created a pocket there.  Then a tunnel was  created from  the pocket up to the shoulder wound and the Silastic catheter  was pulled through.  The Silastic catheter was flushed with heparinized  saline.  I placed a Cook introducer and dilator system over the guide wire  and positioned it into the central venous system under fluoroscopic  guidance.  The dilator and guide wire were removed and the Silastic catheter  threaded down through the introducer into the central venous system and with  the tip positioned at the atrial vena caval junction.  Inflow of heparinized  saline and blood return were noted to be excellent.  The dilator mechanism  was stripped away from the catheter and the portion of the catheter in the  pocket was then trimmed and secured to the reservoir.  Inflow of heparinized  saline and blood return through the reservoir were noted to be excellent.  The reservoir was then seated in the pocket and sutured in place with 2-0  silk sutures.  Final evaluation of the Port-A-Cath placement showed that the  tip of the Port-A-Cath was at the atrial vena caval junction.  There were no  unusual bends, turns, or kinks within the flow of the catheter.  The  reservoir was well-seated.  Needle, sponge, and instrument counts correct.  The pocket wound was closed in two layers with interrupted 3-0 Vicryl and a  running 4-0 Monocryl suture.  The shoulder wound was similarly closed with a  3-0 Vicryl and a 4-0 Monocryl suture.  All wounds were then reinforced with  Steri-Strips.  A sterile dressing was applied after concentrated heparin was  injected into the port and a Huber type  butterfly apparatus was placed in the port for further use.  After the  sterile dressings were applied, the anesthetic was reversed and the patient  was removed from the operating room to the recovery room in stable  condition.  She tolerated the procedure well.                                               Leonie Man, M.D.    PB/MEDQ  D:  08/04/2003  T:   08/04/2003  Job:  [id number]

## 2010-11-19 NOTE — H&P (Signed)
NAMEMADALEN, GAVIN                            ACCOUNT NO.:  [id number]   MEDICAL RECORD NO.:  [id number]                   PATIENT TYPE:  INP   LOCATION:  0283                                 FACILITY:  [facility]   PHYSICIAN:  Pierce Crane, M.D.                   DATE OF BIRTH:  [birth year]   DATE OF ADMISSION:  10/15/2003  DATE OF DISCHARGE:                                HISTORY & PHYSICAL   CHIEF COMPLAINT:  Fever.   HISTORY OF PRESENT ILLNESS:  Ms. Frame is a 45 year old woman who was  diagnosed with HIV in November 1992. In November 2004 she noticed a lump in  the left axilla. Needle biopsy in December 2004 was consistent with high-  grade non-Hodgkin's lymphoma. Flow cytometry showed a monoclonal kappa B  cell population, CB20 positive and C10 negative. She underwent excisional  lymph node biopsy on July 18, 2003. This confirmed the finding on needle  aspirate. On histology, this was a diffuse process. 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 CT scan of the  neck, chest, abdomen, and pelvis on July 02, 2003, showed the large mass  in the left axilla, adjacent smaller left axillary adenopathy, left  supraclavicular lymph node measuring 1.5 x 1.9 cm, no mediastinal or hilar  adenopathy, spleen borderline enlarged, no abdominal or pelvic adenopathy.  Bone marrow biopsy on July 31, 2003, was negative. Lumbar puncture was  also negative. She began treatment with CHOP/Rituxan in early February 2005.  She required admission following cycle #1 secondary to the Staphylococcus  aureus infection of the skin overlying the Port-A-Cath site of which  improved promptly on IV antibiotics. She completed the fourth and final  cycle of CHOP/Rituxan on October 07, 2003, followed by [facility] on October 08, 2003. She was seen in the office today for an unscheduled visit secondary to  concern she had regarding an area of redness on her right forearm. Upon  further  discussion, Ms. Gagner reported fever up to 103 degrees during the  night with associated chills. She also complained of shortness of breath and  chest discomfort which had been progressively over a number of weeks.  Labwork in the office showed hemoglobin of 8.1, white count 0.8, absolute  neutrophil count 0.1, and platelet count 116,000.  She was subsequently  admitted to [facility] for initiation of IV antibiotics and  further evaluation.  Two sets of blood cultures were obtained in the office  and the first dose of Primaxin was given.   PAST MEDICAL HISTORY:  1. Non-Hodgkin's lymphoma as above.  2. Human immunodeficiency virus diagnosed in November 1992, currently on     Sustiva and Combivir.  3. Staphylococcus aureus infection of the skin overlying Port-A-Cath site,     February 2005.  4. Irritable bowel syndrome.  5. Migraine headaches.  6. Gastroesophageal reflux disease/ peptic  ulcer disease.  7. Meningitis, age 21.  8. Diabetes mellitus, type 2.  9. Restless legs.  10.      Anxiety/depression.  11.      Chronic bronchitis.  12.      Status post cholecystectomy in 1989.  13.      Tonsillectomy at age 71 with associated bleeding requiring blood     transfusion.  14.      Left eardrum reconstructive surgery times three.   CURRENT MEDICATIONS:  1. Sustiva 600 mg b.i.d.  2. Combivir 150/300 twice daily.  3. Diflucan 100 mg daily.  4. Mirapex 0.5 mg t.i.d.  5. Protonix 40 mg b.i.d.  6. Humibid two tablets b.i.d.  7. Albuterol inhaler as needed.  8. Zomig 2.5 mg as needed for migraine headaches.  9. Zelnorm 6 mg b.i.d. as  needed.  10.      Septra one tablet daily.  11.      Glucotrol 5 mg daily.  12.      Cymbalta 20 mg daily.  13.      Flonase as needed.  14.      Clarinex as needed.   ALLERGIES:  1. HYPAFIX TAPE.  2. VALIUM.  3. DARVOCET.  4. NEURONTIN.   FAMILY HISTORY:  Mother with hypertension; father committed suicide at age  46 due to severe  migraine headaches; brother age 50 who is healthy.   SOCIAL HISTORY:  Ms. Dwyer is single. She is disabled. She has no history of  ETOH or tobacco use.   REVIEW OF SYSTEMS:  Fever up to 103 degrees with associated chills. Area of  redness, tenderness in the right anterior forearm recently noted.  No  problems with Port-A-Cath. Nausea and vomiting following chemotherapy.  Intermittent diarrhea. Progressive shortness of breath over the past several  weeks. Pain in bilateral axillary regions extending to above the clavicles  bilaterally.  No urinary symptoms. No cough. No bleeding.   PHYSICAL EXAMINATION:  VITAL SIGNS: Temperature 98.1, heart rate 128,  respirations 20, blood pressure 144/79, oxygen saturation 97% on room air.  GENERAL: A pleasant Caucasian female in no acute distress.  HEENT: Normocephalic and atraumatic.  Pupils equal, round, and reactive to  light.  EOMs are intact. Sclerae anicteric. Oropharynx is without thrush or  ulceration.  LYMPH NODES: No palpable cervical, supraclavicular, or axillary lymph nodes.  CHEST: Lungs clear bilaterally; no wheezes or rales. Port-A-Cath is  nontender and without erythema.  CARDIOVASCULAR: Regular, tachycardic.  ABDOMEN: Obese, soft, and nontender.  EXTREMITIES: Right anterior forearm with an approximately 2 cm circular area  of erythema, tender to palpation. Trace edema left hand. Calves soft and  nontender.  NEUROLOGIC: Alert and oriented. Moves all extremities. Motor strength is  5/5. Gait is normal.   LABORATORY DATA:  Hemoglobin 8.1, white count 0.8, absolute neutrophil count  0.1, platelet count 116,000.   IMPRESSION:  1. Non-Hodgkin's lymphoma, status post cycle CHOP/Rituxan day nine.  2. Febrile neutropenia.  3. Shortness of breath/chest discomfort.  4. Human immunodeficiency virus.  5. Diabetes mellitus, type 2,  6. History of migraines.  7. Irritable bowel syndrome.  Ms. Feinberg will be admitted to [facility].   Blood cultures have been  obtained and she was started on Primaxin 500 mg IV q.6h. She received  Neulasta the day following chemotherapy. She will be transfused two units of  blood overnight which may alleviate some of her respiratory symptoms.  We  will also obtain restaging CT scan of  the neck, chest, abdomen, and pelvis  during this hospitalization.  HIV medications will be held for now.  The  patient was interviewed and examined by Dr. Donnie Coffin; plan reviewed.     Lonna Cobb, N.P.                         Pierce Crane, M.D.    LT/MEDQ  D:  10/16/2003  T:  10/16/2003  Job:  [id number]   cc:   Leonie Man, M.D.  [address]  [address]  [place]  [place] [address]  Fax: [phone]   Harrie Jeans, M.D.  [address], [place] [address]  Fax: [phone]   Fransisco Hertz, M.D.  [address]  [place] [address]  Fax: [phone]   Ulyess Mort, M.D. [facility]   Karol T. Lazarus Salines, M.D.  [address] [place]  [place] [address]  Fax: [phone]   [facility]   Pierce Crane, M.D.  [address] [facility] [facility]  [place]  [place] [address]  Fax: [phone]

## 2010-11-19 NOTE — H&P (Signed)
[name], [name]                            ACCOUNT NO.:  [id number]   MEDICAL RECORD NO.:  [id number]                   PATIENT TYPE:  INP   LOCATION:  0278                                 FACILITY:  [facility]   PHYSICIAN:  Genene Churn. Cyndie Chime, M.D.          DATE OF BIRTH:  1965-08-25   DATE OF ADMISSION:  08/13/2003  DATE OF DISCHARGE:                                HISTORY & PHYSICAL   CHIEF COMPLAINT:  Nausea, vomiting, diarrhea, fever and chills.   HISTORY OF PRESENT ILLNESS:  [name] is a 45 year old woman diagnosed with  HIV in November of 1992, history of migraine headaches, irritable bowel  syndrome, diabetes mellitus type 2, and GERD/peptic ulcer disease.  In  November of 2004, she found a lump in the left axilla.  This grew rapidly  over a number of weeks.  Needle biopsy on June 26, 2003 was consistent  with high-grade non-Hodgkin lymphoma.  Flow cytometry showed a monoclonal  kappa B-cell population, CD20 positive and CD10 negative.  The T-helper to  suppressor ratio was reversed consistent with the history of HIV.  Excisional lymph node biopsy on July 18, 2003 confirmed the findings on  needle aspirate.  On histology, this was a diffuse process.  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  CT scans of the  neck, chest, abdomen, and pelvis on July 02, 2003 showed the large mass  in the left axilla, adjacent smaller left axillary adenopathy, a left  supraclavicular lymph node measuring 1.5 x 1.9 cm, no mediastinal or hilar  adenopathy, spleen borderline enlarged, no abdominal or pelvic adenopathy.  Bone marrow biopsy on July 31, 2003 was negative.  Lumbar puncture on  August 01, 2003 was also negative.  LDH was normal.   She was started on CHOP/Rituxan approximately one week ago.  She received  Neulasta on day #2.  She had an initial reaction to the Rituxan, but was  able to complete the infusion.  She presents to the office  today with an  approximate 4-day history of nausea, vomiting, and diarrhea as well as a 48-  hour history of fever/chills and pain over the Port-A-Cath site.  She also  reports a sore throat and dysuria.  She is having some perirectal pain and  states that this is similar to hemorrhoid discomfort she has experienced in  the past.  She denies any shortness of breath or cough.  Lab work in the  office shows an absolute neutrophil count of 0.4.   PAST MEDICAL HISTORY:  1. Stage IIA non-Hodgkin lymphoma; IVF low risk.  2. HIV diagnosed November 1992 currently on Sustiva and Combivir.  3. Irritable bowel syndrome.  4. Migraine headaches.  5. GERD/PUD.  6. Meningitis age 60.  7. Diabetes mellitus type 2.  8. Restless legs on Mirapex.  9. Anxiety/depression.  10.      Chronic bronchitis.  11.  Cholecystectomy 1989.  12.      Tonsillectomy at age 77 with associated bleeding requiring a blood     transfusion.  13.      Left eardrum reconstructive surgery x3.   CURRENT MEDICATIONS:  1. Zomig 2.5 mg as needed.  2. Zelnorm 6 mg twice daily.  3. Mirapex 0.5 mg t.i.d.  4. Protonix 40 mg twice daily.  5. Humibid 2 tablets twice daily.  6. Albuterol metered-dose inhaler as needed.  7. Diflucan 100 mg daily.  8. Sustiva 600 mg b.i.d.  9. Combivir 150/300 twice daily.   ALLERGIES:  1. HYPAFIX TAPE.  2. VALIUM.  3. DARVOCET.  4. NEURONTIN.   FAMILY HISTORY:  A sister is 75 years old and also has a history of  migraines.  Mother deceased age 60 with hypertension.  Father committed  suicide at age 70 due to severe migraine headaches.  Brother age 32 who is  healthy.   SOCIAL HISTORY:  [name] is single.  She is disabled.  She has no history  of EtOH or tobacco use.   REVIEW OF SYSTEMS:  Per HPI.   PHYSICAL EXAMINATION:  VITAL SIGNS:  Temperature 98.6, heart rate 122,  respirations 24, blood pressure 142/88.  GENERAL:  A Caucasian female in no acute distress.  HEENT:  Facial  flushing.  Pupils equal, round, reactive to light.  Extraocular movements are intact.  Sclerae anicteric.  Oropharynx is clear.  No thrush or ulcers.  Pharynx is without erythema or exudate.  CHEST:  Lungs are clear anteriorly.  Right chest Port-A-Cath site with  erythema and tenderness superiorly.  CARDIOVASCULAR:  Regular, tachycardic, no murmur.  ABDOMEN:  Soft and nontender.  No organomegaly.  Obese.  RECTAL:  External hemorrhoids.  Perirectal tenderness.  EXTREMITIES:  No clubbing, cyanosis, or edema.  NEUROLOGICAL:  She is alert and oriented.  Follows commands.  Moves all  extremities.  Motor strength is 5/5.   LABORATORY DATA:  Hemoglobin 12.1, white count 1, absolute neutrophil count  0.032, platelets 140,000.   IMPRESSION:  1. Febrile neutropenia in an human immunodeficiency virus positive patient     on chemotherapy.  2. Stage IIA non-Hodgkin lymphoma status post cycle #1 CHOP, Rituxan day #8.  3. Acute cellulitis of her Port-A-Cath.  4. Nausea/vomiting/diarrhea.  5. Human immunodeficiency virus.  6. Diabetes mellitus type 2.  7. Migraine headaches.   PLAN:  [name] will be admitted to the oncology unit at [facility].  Cultures and a chest x-ray will be obtained.  Parenteral  antibiotics will be initiated.  Antiemetics and antidiarrheals as needed.   The patient interviewed and examined by Dr. Cyndie Chime, plan reviewed.     Lonna Cobb, N.P.                         Genene Churn. Cyndie Chime, M.D.    LT/MEDQ  D:  08/13/2003  T:  08/13/2003  Job:  [id number]   cc:   Leonie Man, M.D.  [address]  [address]  [place]  [place] [address]  Fax: [phone]   Harrie Jeans, M.D.  [address] [place] [address]  Fax: [phone]   Fransisco Hertz, M.D.  [address]  [place] [address]  Fax: [phone]   Ulyess Mort, M.D. [facility]   Karol T. Lazarus Salines, M.D.  [address] [place]  [place] [address]  Fax: [phone]  [facility]

## 2010-11-19 NOTE — Op Note (Signed)
[name], [name]                            ACCOUNT NO.:  [id number]   MEDICAL RECORD NO.:  [id number]                   PATIENT TYPE:  AMB   LOCATION:  DSC                                  FACILITY:  [facility]   PHYSICIAN:  Leonie Man, M.D.                DATE OF BIRTH:  [birth year]   DATE OF PROCEDURE:  11/28/2003  DATE OF DISCHARGE:                                 OPERATIVE REPORT   PREOPERATIVE DIAGNOSIS:  Port-A-Cath for removal.   POSTOPERATIVE DIAGNOSIS:  Port-A-Cath for removal.   PROCEDURE:  Removal of Port-A-Cath infusion device.   SURGEON:  Mardene Celeste. Lurene Shadow, M.D.   ASSISTANT:  Nurse.   ANESTHESIA:  Local, I used 1% plain lidocaine.   NOTE:  [name] is a 45 year old woman with HIV and lymphoma who has now  completed her chemotherapy and is completing radiation therapy and returns  for removal of her Port-A-Cath.  She understands the risks and potential  benefits of surgery and gives consent.   PROCEDURE:  With the patient positioned supine, the region over the Port-A-  Cath in the right shoulder is prepped and draped to be included in a sterile  operative field.  I infiltrated this area with 1% lidocaine.  I made a  transverse incision through this old scar cicatrix, deepened this through  the skin and subcutaneous tissue carrying the dissection down to the capsule  of the Port-A-Cath.  The Port-A-Cath was then elevated and the sutures  holding the Port-A-Cath in place were then removed.  The tunnel of the  Silastic catheter was then closed with a 3-0 Vicryl suture and the Port-A-  Cath was removed.  This was given to the patient for her personal souvenir.  Hemostasis was obtained by suturing the subcutaneous tissues with  interrupted sutures of 3-0 Vicryl, thereby closing the subcutaneous skin and  subcutaneous fat.  The skin was closed with a running 4-0 Monocryl suture  reinforced with Steri-Strips.  A sterile dressing was applied.  The patient  was removed  from the operating room in stable condition.  She tolerated the  procedure well.                                               Leonie Man, M.D.    PB/MEDQ  D:  11/28/2003  T:  11/28/2003  Job:  [id number]

## 2010-11-19 NOTE — Op Note (Signed)
[name], [name]                            ACCOUNT NO.:  [id number]   MEDICAL RECORD NO.:  [id number]                   PATIENT TYPE:  OIB   LOCATION:  5735                                 FACILITY:  [facility]   PHYSICIAN:  Leonie Man, M.D.                DATE OF BIRTH:  [birth year]   DATE OF PROCEDURE:  07/18/2003  DATE OF DISCHARGE:                                 OPERATIVE REPORT   PREOPERATIVE DIAGNOSIS:  Left axillary mass (lymphoma).   POSTOPERATIVE DIAGNOSIS:  Left axillary mass (lymphoma). Final pathology  with cell type pending.   PROCEDURE:  Excision of mass left axilla.   SURGEON:  Leonie Man, M.D.   ASSISTANT:  Angelia Mould. Derrell Lolling, M.D.   ANESTHESIA:  General.   INDICATIONS FOR PROCEDURE:  This patient is a 45 year old woman with HIV  infection who presents with a large left axillary mass which on core needle  biopsy shows what is felt to be a lymphoma. She has been having severe pain  from the pressure of the mass within the axilla and she comes to the  operating room now for excision of this mass to relieve her pain and to get  a diagnosis of the cell type of lymphoma.   DESCRIPTION OF PROCEDURE:  Following the induction of satisfactory general  anesthesia, the patient's positioned supinely and the left axilla prepped  and draped to be included in the sterile operative field. A transverse  incision as made over the mass which is approximately 15 cm deep down  through the skin and subcutaneous tissue carrying around the dissection  medially down to the chest wall where the mass was somewhat adherent and  then carrying superiorly and inferiorly around the mass.  The mass was  removed in its entirety and hemostasis obtained with electrocautery or  clamps and ties of 2-0 silk.  I placed two 19 [place] Blake drains into the  wound for drainage. The subcutaneous tissues were then closed with a running  2-0 Vicryl suture and the skin was closed with a running 4-0  Monocryl suture  and then reinforced with Steri-Strips.  A sterile dressing was applied after  the drains were secured to the skin with 3-0 nylon sutures.  The patient was  removed from the operating room to the recovery room in stable condition.  She tolerated the procedure well.                                               Leonie Man, M.D.    PB/MEDQ  D:  07/18/2003  T:  07/18/2003  Job:  [id number]

## 2010-11-19 NOTE — Assessment & Plan Note (Signed)
[facility]                         GASTROENTEROLOGY OFFICE NOTE   NAME:[name], [name]                         MRN:          [id number]  DATE:09/06/2006                            DOB:          03-13-1966    [name] says she is doing a little bit better, but she still has nausea  and vomiting at times.  She has lost a few pounds; she is still 282  pounds today.  She does have some changes in her bowel activity as well  with alternating diarrhea and constipation; it is only intermittent, but  it is not as much as it used to be.  She sees no blood.  She has some  mucus.  Her appetite is not quite as great as it has been, which is  helpful with her weight.  She does have some generalized abdominal pain  and cramping at times.  She is still taking Nexium b.i.d.; she says it  works great.  She is taking her Reglan 10 mg a.c. and h.s. and I even  suggested that she possibly could increase that to 20 mg to see if it  would give her any help as far as her morning and lunch-time vomiting  spells that she has.  She also complains of some hemorrhoids, not  bleeding, but just discomfort that continues to bother her.  I discussed  with her how to handle this better and I told her I would give her some  Analpram HC cream 2.5 mg%.   PHYSICAL EXAMINATION:  Today she weighed 282, blood pressure 112/74,  pulse 80 and regular.  HEENT:  EOMI.  PERRLA. Sclerae are anicteric.  Conjunctivae are pink.  NECK:  Supple without thyromegaly, adenopathy or carotid bruits,  basically unremarkable.  CHEST:  Clear to auscultation and percussion without adventitious  sounds.  CARDIAC: Regular rhythm; normal S1, S2.  There are no murmurs, gallops  or rubs.  ABDOMEN:  Bowel sounds are normoactive.  Abdomen is soft, non-tender and  non-distended.  She did have some bruises on her abdominal area where  she has been on some Lovenox for a recent blood clot in her groin, or  what it sounds  like.  She is presently on Coumadin because of this and  the Lovenox.  EXTREMITIES:  Full range of motion.  No cyanosis, clubbing or edema,  basically unremarkable.  RECTAL:  There are no masses.  Stool is Hemoccult negative.   MEDICATIONS:  She is on multiple medications including Valtrex, Sustiva,  Cymbalta, the Reglan, Diflucan, Zomig, Advair, Mirapex and Amitiza she  states caused her nausea, so we stopped that, Lipitor, Lopid, Combivir,  atenolol, Ativan, glipizide and the Coumadin and Lovenox.   IMPRESSION:  1. Moderate obesity.  2. History of non-Hodgkin's lymphoma, questionable recurrence.  3. Human immunodeficiency virus-positive, on antiviral therapy.  4. Diabetes, on oral agents.  5. Status post cholecystectomy.  6. Gastrointestinal dysmotility possibly related to diabetes.  7. Hemorrhoids.   RECOMMENDATION:  To be on some Analpram HC cream and use hemorrhoidal  care.  I told her to increase her Reglan  to see if this would give her  any more relief as far as her vomiting  was concerned and to continue doing what she was doing with her  medications.  I will add Nexium and see if this will not help her  symptoms.     Ulyess Mort, MD  Electronically Signed    SML/MedQ  DD: 09/06/2006  DT: 09/06/2006  Job #: [id number]

## 2010-11-19 NOTE — Consult Note (Signed)
NAMECHELCEA, [name]                            ACCOUNT NO.:  [id number]   MEDICAL RECORD NO.:  [id number]                   PATIENT TYPE:  INP   LOCATION:  0272                                 FACILITY:  [facility]   PHYSICIAN:  Leonie Man, M.D.                DATE OF BIRTH:  01-09-1966   DATE OF CONSULTATION:  DATE OF DISCHARGE:                                   CONSULTATION   PROBLEM:  1. Leukopenia with cellulitis surrounding the Port-A-Cath site.  2. B cell non-Hodgkin's lymphoma .  3. Human immunodeficiency virus.   This patient is a 45 year old who recently __________ following an excision  of a very large lymph node of the left axilla which showed high grade B cell  non-Hodgkin's lymphoma. She has subsequently been treated for chemotherapy  with CHOP and Rituxan.  She had a Port-A-Cath placed recently to facilitate  her chemotherapy.  Following approximately  her fourth or fifth treatment of  chemotherapy, she noted the onset of some nausea and vomiting, fever.  She  also noticed some significant erythema around apparently some of the site of  her Port-A-Cath.  Her symptoms have worsened over the ensuring days. She was  admitted to the hospital with a severe leukopenia, 900 white cells.  She was  started on Primaxin.  Her white blood cell count is now recovering to  approximately 2000.  I have been asked by Dr. Cyndie Chime to consult on her  for the possibility of an infected Port-A-Cath.   PAST MEDICAL HISTORY:  1. Stage 2A non-Hodgkin's lymphoma .  2. HIV.  3. Diabetes mellitus type 2.  4. Restless leg syndrome.  5. Anxiety and depression.  6. Status post cholecystectomy.  7. Status post tonsillectomy.  8. Status post eardrum reconstructive surgery.   CURRENT MEDICATIONS:  1. Zomig.  2. Zelnorm.  3. Miraplex.  4. Protonix.  5. Humibid.  6. Albuterol.  7. Diflucan.  8. Sustiva.  9. Combivir.   ALLERGIES:  HYPERFIX, VALIUM, NEURONTIN and DARVOCET.   FAMILY  HISTORY AND SOCIAL HISTORY:  This is a single white female whose on  disability, no history of alcohol, tobacco or drug abuse.  It is not known  how she became HIV infected.  She lives with her mother at home and has a  very supportive sister.   REVIEW OF SYMPTOMS:  Negative in detail except as outlined above.   PHYSICAL EXAMINATION:  GENERAL:  She is an obese, alert woman.  VITAL SIGNS:  Heart rate 97, respirations 22.  She was afebrile, temperature  of 97.5, blood pressure is 120/55.  HEAD/NECK:  Normal.  CHEST:  Clear to auscultation. On the chest wall, there is an area of  erythema over the tract of the Port-A-Cath tubing somewhat less over the  actual Port-A-Cath pocket. At the shoulder site, there is a small wound from  which some purulent material is  extruded and cultured.  HEART:  Regular rate and rhythm.  BREASTS:  No masses, no axillary adenopathy and no nipple discharge.  ABDOMEN:  Obese but otherwise unremarkable.  EXTREMITIES:  No edema or calf tenderness.  NEUROLOGIC:  The patient is alert and fully oriented and no gross motor  sensory deficit.   IMPRESSION:  Febrile neutropenia in immunocompromised patient with HIV and B  cell lymphoma and associated cellulitis and purulent drainage from around  the insertion site of her Port-A-Cath.   PLAN:  Continue warm compresses over the Port-A-Cath site and pending  cultures she will continue on Primaxin.  If the cellulitis does not resolve  or becomes worse pending culture or readjustment of antibiotics she will  need to have the entire Port-A-Cath system removed and this should be  followed by an approximately six week to two month hiatus allowing the  infection to completely clear prior to replacing her Port-A-Cath.                                               Leonie Man, M.D.    PB/MEDQ  D:  08/15/2003  T:  08/16/2003  Job:  [id number]   cc:   [facility]

## 2010-11-19 NOTE — Discharge Summary (Signed)
[name], [name]                            ACCOUNT NO.:  [id number]   MEDICAL RECORD NO.:  [id number]                   PATIENT TYPE:  INP   LOCATION:  0272                                 FACILITY:  [facility]   PHYSICIAN:  Genene Churn. Cyndie Chime, M.D.          DATE OF BIRTH:  February 19, 1966   DATE OF ADMISSION:  08/13/2003  DATE OF DISCHARGE:  08/18/2003                                 DISCHARGE SUMMARY   CHIEF COMPLAINT:  Nausea, vomiting, diarrhea;  fever and chills.   HISTORY OF PRESENT ILLNESS:  [name] is a 45 year old woman with a history  of HIV diagnosed in November 1992.  In November 2004, she noted a lump in  the left axilla.  Needle biopsy on June 26, 2003, was consistent with  high-grade non-Hodgkins lymphoma.  Flow cytometry showed a monoclonal kappa  B-cell population, CD-20 positive, and CD-10 negative.  She underwent an  excisional lymph node biopsy on July 18, 2003, which confirmed the  findings on needle aspirate.  On histology this was noted to be a diffuse  process.  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  CT scans of the neck, chest, abdomen, and pelvis on July 02, 2003, showed the large mass in the left axilla, as well as adjacent smaller  left axillary adenopathy, and a left supraclavicular lymph node measuring  1.5 x 1.9 cm.  There was no mediastinal or hilar adenopathy.  The spleen was  borderline enlarged.  There was no abdominal or pelvic adenopathy.  Bone  marrow biopsy on July 31, 2003, was negative for involvement.  Lumbar  puncture on August 01, 2003, was also negative.   She received the first cycle of CHOP/Rituxan one week prior to this  admission.  She received Neulasta on day #2.  She presented to the office on  August 13, 2003, with a four day history of nausea and vomiting, diarrhea,  and a two day history of fever, chills, and pain over the Port-A-Cath site.  Laboratory work obtained in the office  revealed neutropenia.  She was  subsequently admitted for further evaluation.   PHYSICAL EXAMINATION:  VITAL SIGNS:  Temperature of 98.6, heart rate of 122,  respirations 24, and blood pressure 142/88.  CHEST:  Erythema and tenderness over the right chest Port-A-Cath site  proximally.  RECTAL:  She was also noted to have external hemorrhoids;  perirectal  tenderness.  LUNGS:  Clear bilaterally.   HOSPITAL COURSE:  [name] was admitted to [facility] on August 13, 2003, with febrile neutropenia.  Admission laboratory work showed a  hemoglobin of 12.1, white blood cell count 1, absolute neutrophil count  0.032, and a platelet count of 140,000.  Blood cultures and a chest x-ray  were obtained on admission.  IV antibiotics initiated with Primaxin 500 mg  IV every six hours.  A culture was also obtained from  the Port-A-Cath site.  The blood cultures remained negative.  The culture from the Port-A-Cath site  returned positive for moderate Staph aureus.  As the white blood cell count  began to show evidence of recovery, the Port-A-Cath site became increasingly  tender and erythematous.  General surgical consult was obtained with the  patient evaluated by Dr. Lurene Shadow.  He agreed with continuation of the  antibiotics.  There was dramatic improvement of the area of cellulitis over  the proximal Port-A-Cath site with continuation of IV antibiotics and  recovery of her white blood cell count.  The decision was made to leave the  Port-A-Cath in place and continue oral antibiotics for a full 10 day  outpatient course.  Dr. Lurene Shadow will follow up with [name] in one week.  She remained afebrile for greater than 48 hours prior to discharge.  CBC on  August 18, 2003, showed hemoglobin of 9.7, white count 5.3, absolute  neutrophil count of 4, and a platelet count of 218,000.   Upon admission, [name] was experiencing nausea and vomiting and diarrhea.  IV fluids, antiemetics, and  anti-diarrheal were initiated.  Stool samples  were negative for C. difficile.  She was placed on a clear liquid diet.  The  symptoms resolved, and she was tolerating a regular diet by discharge.  She  was also experiencing perirectal discomfort upon admission which also  improved and was felt to be secondary to hemorrhoids.   [name] has a history of diabetes mellitus type 2, which is diet  controlled.  Blood sugars were followed with daily lab work with all  readings less than 180.   HIV medications including Sustiva and Combivir were continued during this  hospitalization.   On August 18, 2003, [name] was felt to be stable for discharge home.  Blood counts had recovered and she was afebrile.  She will begin Keflex 500  mg three times daily for a 10 day course.  She will keep the scheduled  appointment with Dr. Cyndie Chime later this week.   LABORATORY DATA:  On August 18, 2003, hemoglobin was 9.7, white count 5.3,  absolute neutrophil count 4, platelet count 218,000.  Sodium 139, potassium  3.8, chloride 112, CO2 26, glucose 152, BUN 3, creatinine 0.6, calcium 8.3.   CONSULTATIONS:  Dr. Leonie Man.   PROCEDURES:  1. Intravenous antibiotics.  2. Intravenous fluids.   DISCHARGE DIAGNOSES:  1. Staph aureus infection of skin with prompt improvement on intravenous     antibiotics and with recovery of white blood cell count (completed six     days of Primaxin to be followed by 10 days of oral Keflex).  2. High-grade non-Hodgkins lymphoma, status post cycle #1 of CHOP/Rituxan     day #13.  3. Fever/neutropenia following first cycle of chemotherapy, resolved.  4. Human immunodeficiency virus positive.  5. Nausea and vomiting, diarrhea, resolved.  6. Diabetes mellitus type 2.  7. Migraine headaches.  8. Irritable bowel syndrome.  9. Restless leg.   DISPOSITION:  1. Condition is improved.  2. Diet is as tolerated. 3. Activity is as tolerated.  4. Wound care:  Routine  care of Port-A-Cath site.  5. Special instructions:  [name] was instructed to call for fever greater     than or equal to 100 degrees or any other problems.  6. Follow up:  Keep scheduled follow up appointment with Dr. Cyndie Chime for     later this week.   DISCHARGE MEDICATIONS:  1.  Keflex 500 mg three times daily for 10 days.  2. Sustiva 600 mg twice daily.  3. Combivir 150/300 twice daily.  4. Diflucan 100 mg daily.  5. Mirapex 0.5 mg three times daily.  6. Protonix 40 mg twice daily.  7. Humibid two tablets twice daily.  8. Albuterol inhaler as needed.  9. Zomig 2.5 mg as needed for migraine headaches.  10.      Zelnorm 6 mg twice daily (hold for diarrhea).     Lonna Cobb, N.P.                         Genene Churn. Cyndie Chime, M.D.    LT/MEDQ  D:  08/18/2003  T:  08/18/2003  Job:  [id number]   cc:   Leonie Man, M.D.  [address]  [address]  [place]  [place] [address]  Fax: [phone]   Harrie Jeans, M.D.  [address], [place] [address]  Fax: [phone]   Fransisco Hertz, M.D.  [address]  [place] [address]  Fax: [phone]   Ulyess Mort, M.D. [facility]   Karol T. Lazarus Salines, M.D.  [address]  [place] [address]  Fax: [phone]   [facility]

## 2010-12-06 ENCOUNTER — Other Ambulatory Visit: Payer: Self-pay | Admitting: Infectious Disease

## 2010-12-06 ENCOUNTER — Other Ambulatory Visit: Payer: Medicaid Other

## 2010-12-06 DIAGNOSIS — B20 Human immunodeficiency virus [HIV] disease: Secondary | ICD-10-CM

## 2010-12-14 ENCOUNTER — Other Ambulatory Visit (INDEPENDENT_AMBULATORY_CARE_PROVIDER_SITE_OTHER): Payer: Medicaid Other

## 2010-12-14 ENCOUNTER — Other Ambulatory Visit: Payer: Self-pay | Admitting: Infectious Disease

## 2010-12-14 DIAGNOSIS — Z113 Encounter for screening for infections with a predominantly sexual mode of transmission: Secondary | ICD-10-CM

## 2010-12-14 DIAGNOSIS — B20 Human immunodeficiency virus [HIV] disease: Secondary | ICD-10-CM

## 2010-12-14 LAB — URINALYSIS, ROUTINE W REFLEX MICROSCOPIC
Bilirubin Urine: NEGATIVE
Protein, ur: NEGATIVE mg/dL
Urobilinogen, UA: 0.2 mg/dL (ref 0.0–1.0)

## 2010-12-15 LAB — COMPLETE METABOLIC PANEL WITH GFR
ALT: 14 U/L (ref 0–35)
AST: 17 U/L (ref 0–37)
Albumin: 4.4 g/dL (ref 3.5–5.2)
Alkaline Phosphatase: 57 U/L (ref 39–117)
BUN: 9 mg/dL (ref 6–23)
Potassium: 4.3 mEq/L (ref 3.5–5.3)
Sodium: 139 mEq/L (ref 135–145)
Total Protein: 7.1 g/dL (ref 6.0–8.3)

## 2010-12-15 LAB — T-HELPER CELL (CD4) - (RCID CLINIC ONLY): CD4 T Cell Abs: 490 uL (ref 400–2700)

## 2010-12-15 LAB — CBC WITH DIFFERENTIAL/PLATELET
Basophils Absolute: 0 10*3/uL (ref 0.0–0.1)
Basophils Relative: 0 % (ref 0–1)
MCHC: 34.1 g/dL (ref 30.0–36.0)
Neutro Abs: 2 10*3/uL (ref 1.7–7.7)
Neutrophils Relative %: 53 % (ref 43–77)
Platelets: 138 10*3/uL — ABNORMAL LOW (ref 150–400)
RDW: 13.9 % (ref 11.5–15.5)
WBC: 3.8 10*3/uL — ABNORMAL LOW (ref 4.0–10.5)

## 2010-12-15 LAB — LIPID PANEL
HDL: 37 mg/dL — ABNORMAL LOW (ref >39)
Triglycerides: 451 mg/dL — ABNORMAL HIGH (ref <150)

## 2010-12-15 LAB — RPR

## 2010-12-16 LAB — GC/CHLAMYDIA PROBE AMP, URINE
Chlamydia, Swab/Urine, PCR: NEGATIVE
GC Probe Amp, Urine: NEGATIVE

## 2010-12-20 ENCOUNTER — Emergency Department (HOSPITAL_COMMUNITY): Payer: Medicaid Other

## 2010-12-20 ENCOUNTER — Ambulatory Visit (INDEPENDENT_AMBULATORY_CARE_PROVIDER_SITE_OTHER): Payer: Medicaid Other | Admitting: Infectious Disease

## 2010-12-20 ENCOUNTER — Encounter: Payer: Self-pay | Admitting: Internal Medicine

## 2010-12-20 ENCOUNTER — Inpatient Hospital Stay (HOSPITAL_COMMUNITY)
Admission: EM | Admit: 2010-12-20 | Discharge: 2010-12-23 | DRG: 313 | Disposition: A | Discharge status: Home / Self Care | Payer: Medicaid Other | Attending: Internal Medicine | Admitting: Internal Medicine

## 2010-12-20 ENCOUNTER — Ambulatory Visit (HOSPITAL_COMMUNITY)
Admission: RE | Admit: 2010-12-20 | Discharge: 2010-12-20 | Disposition: A | Discharge status: Home / Self Care | Payer: Medicaid Other | Source: Ambulatory Visit | Attending: Internal Medicine | Admitting: Internal Medicine

## 2010-12-20 ENCOUNTER — Ambulatory Visit: Payer: Medicaid Other | Admitting: Infectious Disease

## 2010-12-20 VITALS — BP 132/84 | HR 64 | Temp 98.4°F | Wt 225.0 lb

## 2010-12-20 DIAGNOSIS — E785 Hyperlipidemia, unspecified: Secondary | ICD-10-CM | POA: Diagnosis present

## 2010-12-20 DIAGNOSIS — K219 Gastro-esophageal reflux disease without esophagitis: Secondary | ICD-10-CM | POA: Diagnosis present

## 2010-12-20 DIAGNOSIS — Z888 Allergy status to other drugs, medicaments and biological substances status: Secondary | ICD-10-CM

## 2010-12-20 DIAGNOSIS — F329 Major depressive disorder, single episode, unspecified: Secondary | ICD-10-CM

## 2010-12-20 DIAGNOSIS — G8929 Other chronic pain: Secondary | ICD-10-CM | POA: Diagnosis present

## 2010-12-20 DIAGNOSIS — G5690 Unspecified mononeuropathy of unspecified upper limb: Secondary | ICD-10-CM | POA: Diagnosis present

## 2010-12-20 DIAGNOSIS — R079 Chest pain, unspecified: Secondary | ICD-10-CM | POA: Insufficient documentation

## 2010-12-20 DIAGNOSIS — R5383 Other fatigue: Secondary | ICD-10-CM

## 2010-12-20 DIAGNOSIS — M5430 Sciatica, unspecified side: Secondary | ICD-10-CM | POA: Diagnosis present

## 2010-12-20 DIAGNOSIS — Z880 Allergy status to penicillin: Secondary | ICD-10-CM

## 2010-12-20 DIAGNOSIS — Z22322 Carrier or suspected carrier of Methicillin resistant Staphylococcus aureus: Secondary | ICD-10-CM

## 2010-12-20 DIAGNOSIS — R0789 Other chest pain: Principal | ICD-10-CM | POA: Diagnosis present

## 2010-12-20 DIAGNOSIS — J40 Bronchitis, not specified as acute or chronic: Secondary | ICD-10-CM | POA: Insufficient documentation

## 2010-12-20 DIAGNOSIS — I959 Hypotension, unspecified: Secondary | ICD-10-CM | POA: Diagnosis not present

## 2010-12-20 DIAGNOSIS — G5790 Unspecified mononeuropathy of unspecified lower limb: Secondary | ICD-10-CM | POA: Diagnosis present

## 2010-12-20 DIAGNOSIS — C8589 Other specified types of non-Hodgkin lymphoma, extranodal and solid organ sites: Secondary | ICD-10-CM | POA: Diagnosis present

## 2010-12-20 DIAGNOSIS — G43909 Migraine, unspecified, not intractable, without status migrainosus: Secondary | ICD-10-CM | POA: Diagnosis present

## 2010-12-20 DIAGNOSIS — M549 Dorsalgia, unspecified: Secondary | ICD-10-CM | POA: Diagnosis present

## 2010-12-20 DIAGNOSIS — F410 Panic disorder [episodic paroxysmal anxiety] without agoraphobia: Secondary | ICD-10-CM | POA: Diagnosis present

## 2010-12-20 DIAGNOSIS — E538 Deficiency of other specified B group vitamins: Secondary | ICD-10-CM

## 2010-12-20 DIAGNOSIS — J42 Unspecified chronic bronchitis: Secondary | ICD-10-CM | POA: Diagnosis present

## 2010-12-20 DIAGNOSIS — K5900 Constipation, unspecified: Secondary | ICD-10-CM | POA: Diagnosis present

## 2010-12-20 DIAGNOSIS — B20 Human immunodeficiency virus [HIV] disease: Secondary | ICD-10-CM

## 2010-12-20 DIAGNOSIS — F3289 Other specified depressive episodes: Secondary | ICD-10-CM | POA: Diagnosis present

## 2010-12-20 DIAGNOSIS — R5381 Other malaise: Secondary | ICD-10-CM

## 2010-12-20 DIAGNOSIS — K589 Irritable bowel syndrome without diarrhea: Secondary | ICD-10-CM | POA: Diagnosis present

## 2010-12-20 DIAGNOSIS — Z21 Asymptomatic human immunodeficiency virus [HIV] infection status: Secondary | ICD-10-CM | POA: Diagnosis present

## 2010-12-20 HISTORY — DX: Carrier or suspected carrier of methicillin resistant Staphylococcus aureus: Z22.322

## 2010-12-20 LAB — CBC
MCH: 35.3 pg — ABNORMAL HIGH (ref 26.0–34.0)
MCV: 99.5 fL (ref 78.0–100.0)
Platelets: 140 10*3/uL — ABNORMAL LOW (ref 150–400)
RBC: 3.85 MIL/uL — ABNORMAL LOW (ref 3.87–5.11)

## 2010-12-20 LAB — BASIC METABOLIC PANEL
Calcium: 9.2 mg/dL (ref 8.4–10.5)
GFR calc Af Amer: >60 mL/min (ref >60)
GFR calc non Af Amer: >60 mL/min (ref >60)
Glucose, Bld: 87 mg/dL (ref 70–99)
Potassium: 3.5 mEq/L (ref 3.5–5.1)
Sodium: 139 mEq/L (ref 135–145)

## 2010-12-20 LAB — CARDIAC PANEL(CRET KIN+CKTOT+MB+TROPI): CK, MB: 1.6 ng/mL (ref 0.3–4.0)

## 2010-12-20 LAB — DIFFERENTIAL
Eosinophils Absolute: 0.1 10*3/uL (ref 0.0–0.7)
Eosinophils Relative: 2 % (ref 0–5)
Lymphs Abs: 2.2 10*3/uL (ref 0.7–4.0)
Monocytes Relative: 5 % (ref 3–12)
Neutrophils Relative %: 52 % (ref 43–77)

## 2010-12-20 LAB — HEPATIC FUNCTION PANEL
Albumin: 3.8 g/dL (ref 3.5–5.2)
Alkaline Phosphatase: 53 U/L (ref 39–117)
Bilirubin, Direct: 0.1 mg/dL (ref 0.0–0.3)
Total Bilirubin: 0.4 mg/dL (ref 0.3–1.2)

## 2010-12-20 LAB — TROPONIN I: Troponin I: <0.3 ng/mL (ref <0.30)

## 2010-12-20 LAB — D-DIMER, QUANTITATIVE: D-Dimer, Quant: 0.22 ug/mL-FEU (ref 0.00–0.48)

## 2010-12-20 MED ORDER — ASPIRIN 81 MG PO CHEW
81.0000 mg | CHEWABLE_TABLET | Freq: Every day | ORAL | Status: AC
Start: 2010-12-20 — End: 2011-12-20

## 2010-12-20 MED ORDER — NITROGLYCERIN 0.3 MG SL SUBL
0.4000 mg | SUBLINGUAL_TABLET | SUBLINGUAL | Status: DC | PRN
  Administered 2010-12-20: 0.4 mg via SUBLINGUAL

## 2010-12-20 MED ORDER — ASPIRIN 81 MG PO CHEW
81.0000 mg | CHEWABLE_TABLET | Freq: Once | ORAL | Status: AC
  Administered 2010-12-20: 81 mg via ORAL

## 2010-12-20 MED ORDER — DOXYCYCLINE HYCLATE 100 MG PO TABS: 100.0000 mg | ORAL_TABLET | Freq: Two times a day (BID) | ORAL | Status: AC

## 2010-12-20 MED ORDER — DULOXETINE HCL 30 MG PO CPEP: ORAL_CAPSULE | ORAL | Status: DC

## 2010-12-20 MED ORDER — NITROGLYCERIN 0.4 MG SL SUBL: 0.4000 mg | SUBLINGUAL_TABLET | SUBLINGUAL | Status: DC | PRN

## 2010-12-20 NOTE — Assessment & Plan Note (Signed)
We gave her several nitroglycerin without relief. We gave her full dose aspirin 3 worsen in the emergency department. EKG here did not show any obvious ST or T wave changes. I do think she needs further monitoring workup and the margin to our department instructed to ensure that there is not any evidence of ongoing cardiac ischemia. This is most likely due to her anxiety and depression.

## 2010-12-20 NOTE — Progress Notes (Unsigned)
HPI: Patient is a 45 yo female with a PMH of HIV infection and non-Hodgkins lymphoma who presents with a 1-2 week of intermittent chest pain. The patient was brought to the ED after her infectious disease clinic appointment when she complained to her physician about intermittent crushing chest pain in the sternal region that radiates down her left arm with numbness and tingling. The patient describes crushing pain that lasts for about fifteen minutes and is not associated with exercise or exertion but is associated with shortness of breath. She was given nitroglycerin in the ID clinic and her pain resolved after the second dose.  The patient reports a cough that is always presented related to chronic bronchitis and says that it has worsened over the last week. She also reports constant nausea but no recent vomiting.  Of note, the patient's main complaint at her ID clinic appointment today was depression and she also has a significant history of anxiety attacks during which she reports feeling chest pain associated with a racing heart, hyperventilation, and sweating. She has these quite commonly and takes Xanax three times a day and an SSRI in an attempt to control it. Her psychosocial and family situation is extremely stressful for her at this point in time with ongoing problems including her boyfriend who is also HIV positive and recently suffered a stroke and her nephew who has recently been in trouble with the law. She reports that she feels fatigued all the time and is having difficulty coping but denies SI.  She also has chronic neuropathy bilaterally in both upper and lower extremities as well as lymphedema on her left side including left leg, leg side abdomen, left breast, and left arm.  Lastly, she has daily migraine headaches  ROS: As per HPI, all other systems reviewed and negative  VITALS: T:97.7 P: 76 BP: 128/84 R: 16 O2SAT: 98% on RA ON:RA  PHYSICAL EXAM: Gen: No apparent  distress, sitting in bed eating her dinner Eyes: PERRL, EOMI. ENT: MMM, OP clear, No erythema, thrush or exudates. Neck: No carotid bruits Resp: CTA- Bilaterally CVS: S1S2 RRR, No M/R/G GI: Abdomen is diffusely minimally tender, but no obvious point tenderness and no organomegaly. Ext: No pedal edema. Lymph: No palpable lymphadenopathy. Neuro: cranial nerves intact- equal strength, 5/5 bilaterally  CBC:    Component Value Date/Time   WBC 5.3 12/20/2010 1441   HGB 13.6 12/20/2010 1441   HGB 13.7 06/08/2010 0834   HCT 38.3 12/20/2010 1441   HCT 39.3 06/08/2010 0834   PLT 140* 12/20/2010 1441   PLT 190 06/08/2010 0834   MCV 99.5 12/20/2010 1441   MCV 104.2* 06/08/2010 0834   NEUTROABS 2.7 12/20/2010 1441   LYMPHSABS 2.2 12/20/2010 1441   MONOABS 0.3 12/20/2010 1441   EOSABS 0.1 12/20/2010 1441   EOSABS 0.2 06/08/2010 0834   BASOSABS 0.0 12/20/2010 1441   BASOSABS 0.0 06/08/2010 0834     Comprehensive Metabolic Panel:    Component Value Date/Time   NA 139 12/20/2010 1441   K 3.5 12/20/2010 1441   CL 98 12/20/2010 1441   CO2 31 12/20/2010 1441   BUN 11 12/20/2010 1441   CREATININE 0.72 12/20/2010 1441   CREATININE 0.71 12/14/2010 1516   GLUCOSE 87 12/20/2010 1441   CALCIUM 9.2 12/20/2010 1441   AST 17 12/14/2010 1516   ALT 14 12/14/2010 1516   ALKPHOS 57 12/14/2010 1516   BILITOT 0.3 12/14/2010 1516   PROT 7.1 12/14/2010 1516   ALBUMIN 4.4 12/14/2010 1516  ASSESSMENT AND PLAN: A/P for this 45 year old woman with a PMH of HIV infection and non-Hodgkins lymphoma who presents with intermittent chest pain of approximately 1-2 wks duration.  1. Chest Pain- The differential includes cardiac, pulmonary, GI, or psychogenic etiology. a. Cardiac: includes ACS, pericarditis, or sequelae induced by radiation treatments from seven years ago including myocarditis or cardiomyopathy. Two EKGs were obtained today which had probable q waves in the anterior precordial leads, V1 and V2, which demonstrated  possible old septal infarcts. However, there were no other signs of more acute changes such as ST elevation and T wave changes. Moreover, the first set of cardiac enzymes was found to be negative. We will: - Admit to tele for continued monitoring - Continue to cycle cardiac enzymes for 2 more cycles 8 hours apart - Continue aspirin therapy 81 mg qd - Make available nitroglycerin 0.54 mg sublingual PRN for repeated episodes of chest pain - Order FLP, HbA1C, as well as a 2D echocardiogram and an AM ECG.  b. Psychogenic: The patient has a history of depression with anxiety and repeated panic attacks. She reports that she has similar episodes in the past during panic attacks with numbness and tingling migrating down both arms. She reports that this episode is different because it is only radiating down one arm and the feeling of pressure is more severe over her sternum. Given her severe anxiety and complex psychosocial environment, her chest pain symptoms could be a result of anxiety. We will: - Continue to administer duloxetine 30 mg PO BID and alprazolam 2 mg PO TID c. Pulmonary: Includes pneumonia, pulmonary embolism, pneumothorax. The patient reported having a chronic cough that periodically worsens and that recently, she had a cough productive of yellow sputum. Chest xray revealed no active disease and she was not tachycardic. - We will obtain a D-dimer to rule out PE as a source of chest pain given her remote history of cancer and DVT. d. GI- gastritis or PUD- the patient has a history of reflux and we put her on a PPI - Nexium 40 mg qd  2. HIV- continue outpatient HIV regimen. Dr. Daiva Eves obtained CD4 count and viral load at the clinic visit in the AM. Results pending.  3. Migraines- Continue Zolmitriptan 5 mg PO qd PRN.  4. Chronic Back Pain- Continue outpatient pain regimen of Percocet 5/325 q 6hrs PRN  5. MRSA colonization- continue outpatient prescription of Doxycycline 100 mg  PO BID per Dr. Daiva Eves  6. DVT Px- will start lovenox

## 2010-12-20 NOTE — Assessment & Plan Note (Signed)
I am starting her on back on Cymbalta. There is some risk of serotonin syndrome with her to administration of Zometa but she is taking both these medications in the past without difficulty. Norvir also elevates serum levels of Cymbalta slightly as well.

## 2010-12-20 NOTE — Assessment & Plan Note (Signed)
She's complained of a chronic cough this may be due to seasonal allergies. However some doxycycline for her boil. If there is any atypical organism causing bronchitis or doxycycline could be of benefit as well.
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
## 2010-12-20 NOTE — Patient Instructions (Signed)
I WOULD also like you to meet with a case manager re your depression

## 2010-12-20 NOTE — Assessment & Plan Note (Signed)
Continue current regimen. I would also contemplate giving her a regimen of PRE ZISTA, Truvada and Norvir alone. This will have to fully active agents that might be sufficient to treat her virus and would take the AZT ever regimen.

## 2010-12-20 NOTE — Assessment & Plan Note (Signed)
She appears to have a frontal again on the uterus and doxycycline for 10 days.

## 2010-12-21 DIAGNOSIS — R072 Precordial pain: Secondary | ICD-10-CM

## 2010-12-21 DIAGNOSIS — R079 Chest pain, unspecified: Secondary | ICD-10-CM

## 2010-12-21 LAB — HEPATIC FUNCTION PANEL
ALT: 14 U/L (ref 0–35)
ALT: 16 U/L (ref 0–35)
AST: 17 U/L (ref 0–37)
Albumin: 3.7 g/dL (ref 3.5–5.2)
Albumin: 3.7 g/dL (ref 3.5–5.2)
Alkaline Phosphatase: 64 U/L (ref 39–117)
Alkaline Phosphatase: 75 U/L (ref 39–117)
Bilirubin, Direct: <0.1 mg/dL (ref 0.0–0.3)
Indirect Bilirubin: 0.2 mg/dL — ABNORMAL LOW (ref 0.3–0.9)
Indirect Bilirubin: 0.1 mg/dL — ABNORMAL LOW (ref 0.3–0.9)
Total Bilirubin: 0.2 mg/dL — ABNORMAL LOW (ref 0.3–1.2)
Total Protein: 6.9 g/dL (ref 6.0–8.3)
Total Protein: 6.9 g/dL (ref 6.0–8.3)

## 2010-12-21 LAB — CARDIAC PANEL(CRET KIN+CKTOT+MB+TROPI)
CK, MB: 1.8 ng/mL (ref 0.3–4.0)
Relative Index: INVALID (ref 0.0–2.5)
Total CK: 67 U/L (ref 7–177)

## 2010-12-21 LAB — LIPID PANEL
Cholesterol: 252 mg/dL — ABNORMAL HIGH (ref 0–200)
HDL: 42 mg/dL (ref >39)
Total CHOL/HDL Ratio: 6 RATIO

## 2010-12-22 ENCOUNTER — Inpatient Hospital Stay (HOSPITAL_COMMUNITY): Payer: Medicaid Other

## 2010-12-22 LAB — HEPATIC FUNCTION PANEL
ALT: 14 U/L (ref 0–35)
AST: 21 U/L (ref 0–37)
AST: 16 U/L (ref 0–37)
Albumin: 3.7 g/dL (ref 3.5–5.2)
Albumin: 3.4 g/dL — ABNORMAL LOW (ref 3.5–5.2)
Bilirubin, Direct: 0.1 mg/dL (ref 0.0–0.3)
Bilirubin, Direct: 0.1 mg/dL (ref 0.0–0.3)
Indirect Bilirubin: 0.1 mg/dL — ABNORMAL LOW (ref 0.3–0.9)
Total Bilirubin: 0.2 mg/dL — ABNORMAL LOW (ref 0.3–1.2)
Total Protein: 6.9 g/dL (ref 6.0–8.3)

## 2010-12-22 MED ORDER — TECHNETIUM TC 99M TETROFOSMIN IV KIT
30.0000 | PACK | Freq: Once | INTRAVENOUS | Status: AC | PRN
  Administered 2010-12-22: 30 via INTRAVENOUS

## 2010-12-22 MED ORDER — TECHNETIUM TC 99M TETROFOSMIN IV KIT
10.0000 | PACK | Freq: Once | INTRAVENOUS | Status: AC | PRN
  Administered 2010-12-22: 10 via INTRAVENOUS

## 2010-12-23 ENCOUNTER — Other Ambulatory Visit (HOSPITAL_COMMUNITY): Payer: Medicaid Other

## 2010-12-23 DIAGNOSIS — R079 Chest pain, unspecified: Secondary | ICD-10-CM

## 2010-12-23 LAB — HEPATIC FUNCTION PANEL
AST: 15 U/L (ref 0–37)
Albumin: 3 g/dL — ABNORMAL LOW (ref 3.5–5.2)
Albumin: 3.1 g/dL — ABNORMAL LOW (ref 3.5–5.2)
Alkaline Phosphatase: 64 U/L (ref 39–117)
Alkaline Phosphatase: 65 U/L (ref 39–117)
Total Bilirubin: 0.2 mg/dL — ABNORMAL LOW (ref 0.3–1.2)
Total Bilirubin: 0.2 mg/dL — ABNORMAL LOW (ref 0.3–1.2)

## 2010-12-23 LAB — CBC
MCH: 34.9 pg — ABNORMAL HIGH (ref 26.0–34.0)
MCV: 100.6 fL — ABNORMAL HIGH (ref 78.0–100.0)
Platelets: 133 10*3/uL — ABNORMAL LOW (ref 150–400)
RDW: 13.4 % (ref 11.5–15.5)
WBC: 4.3 10*3/uL (ref 4.0–10.5)

## 2010-12-23 LAB — COMPREHENSIVE METABOLIC PANEL
AST: 14 U/L (ref 0–37)
Albumin: 3.3 g/dL — ABNORMAL LOW (ref 3.5–5.2)
Calcium: 8.3 mg/dL — ABNORMAL LOW (ref 8.4–10.5)
Chloride: 103 mEq/L (ref 96–112)
Creatinine, Ser: 0.72 mg/dL (ref 0.50–1.10)
Total Protein: 6.2 g/dL (ref 6.0–8.3)

## 2010-12-27 ENCOUNTER — Other Ambulatory Visit: Payer: Self-pay | Admitting: Internal Medicine

## 2010-12-27 DIAGNOSIS — Z1231 Encounter for screening mammogram for malignant neoplasm of breast: Secondary | ICD-10-CM

## 2010-12-28 ENCOUNTER — Ambulatory Visit: Payer: Medicaid Other

## 2011-01-03 ENCOUNTER — Encounter: Payer: Self-pay | Admitting: Infectious Disease

## 2011-01-03 ENCOUNTER — Ambulatory Visit (INDEPENDENT_AMBULATORY_CARE_PROVIDER_SITE_OTHER): Payer: Medicaid Other | Admitting: Internal Medicine

## 2011-01-03 ENCOUNTER — Encounter: Payer: Self-pay | Admitting: Internal Medicine

## 2011-01-03 ENCOUNTER — Ambulatory Visit
Admission: RE | Admit: 2011-01-03 | Discharge: 2011-01-03 | Disposition: A | Discharge status: Home / Self Care | Payer: Medicaid Other | Source: Ambulatory Visit | Attending: Internal Medicine | Admitting: Internal Medicine

## 2011-01-03 ENCOUNTER — Ambulatory Visit (INDEPENDENT_AMBULATORY_CARE_PROVIDER_SITE_OTHER): Payer: Medicaid Other | Admitting: Infectious Disease

## 2011-01-03 VITALS — BP 115/78 | HR 92 | Temp 97.6°F | Wt 227.2 lb

## 2011-01-03 DIAGNOSIS — F329 Major depressive disorder, single episode, unspecified: Secondary | ICD-10-CM

## 2011-01-03 DIAGNOSIS — B20 Human immunodeficiency virus [HIV] disease: Secondary | ICD-10-CM

## 2011-01-03 DIAGNOSIS — E785 Hyperlipidemia, unspecified: Secondary | ICD-10-CM

## 2011-01-03 DIAGNOSIS — Z23 Encounter for immunization: Secondary | ICD-10-CM | POA: Insufficient documentation

## 2011-01-03 DIAGNOSIS — R079 Chest pain, unspecified: Secondary | ICD-10-CM

## 2011-01-03 DIAGNOSIS — R5383 Other fatigue: Secondary | ICD-10-CM

## 2011-01-03 DIAGNOSIS — Z1231 Encounter for screening mammogram for malignant neoplasm of breast: Secondary | ICD-10-CM

## 2011-01-03 LAB — CBC
HCT: 39.4 % (ref 36.0–46.0)
MCV: 101.8 fL — ABNORMAL HIGH (ref 78.0–100.0)
RDW: 13.8 % (ref 11.5–15.5)
WBC: 5.7 10*3/uL (ref 4.0–10.5)

## 2011-01-03 LAB — COMPREHENSIVE METABOLIC PANEL
ALT: 12 U/L (ref 0–35)
Alkaline Phosphatase: 51 U/L (ref 39–117)
CO2: 27 mEq/L (ref 19–32)
Creat: 0.82 mg/dL (ref 0.50–1.10)
Total Bilirubin: 0.4 mg/dL (ref 0.3–1.2)

## 2011-01-03 MED ORDER — NITROGLYCERIN 0.4 MG SL SUBL: 0.4000 mg | SUBLINGUAL_TABLET | SUBLINGUAL | Status: DC | PRN

## 2011-01-03 MED ORDER — EMTRICITABINE-TENOFOVIR DF 200-300 MG PO TABS: 1.0000 | ORAL_TABLET | Freq: Every day | ORAL | Status: DC

## 2011-01-03 MED ORDER — DULOXETINE HCL 30 MG PO CPEP: 30.0000 mg | ORAL_CAPSULE | Freq: Every day | ORAL | Status: DC

## 2011-01-03 MED ORDER — ROSUVASTATIN CALCIUM 10 MG PO TABS: 10.0000 mg | ORAL_TABLET | Freq: Every day | ORAL | Status: DC

## 2011-01-03 NOTE — Assessment & Plan Note (Signed)
I have refilled her cymbalta along with refills. She may benefit from counselling

## 2011-01-03 NOTE — Assessment & Plan Note (Addendum)
She states that she has been started on Crestor added this to her regimen. She will followup with internal medicine later today. Unfortunately her previous inhibitors are likely elevating her triglycerides and lipids but because of her resistant virus she does need a Protease inhibitor regimen to suppress her

## 2011-01-03 NOTE — Assessment & Plan Note (Signed)
This is likely related to depression and other multiple medical problems. I am removing AZT in the form of Combivir from her antiretroviral regimen to see if this will help.

## 2011-01-03 NOTE — Assessment & Plan Note (Signed)
This is very likely to be related to her anxiety. Certainly a cardiac catheter could be done to completely and definitively exclude coronary disease. I will defer this to internal medicine team and also any potential cardiology consult and divided then talked to. I don't think pulmonary embolism is very likely. Her d-dimer was negative.

## 2011-01-03 NOTE — Progress Notes (Signed)
Subjective:    Patient ID: [name], female    DOB: [birth year], 45 y.o.   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 She was recently admitted to [facility] to  2 workup chest pain associated with nausea and paresthesias involving her left arm. Her admission EKG and cardiac enzymes were negative. Her nuclear medicine perfusion stress test showed no reversible ischemia and only in air questionable area of hypoattenuation near the diaphragm. Her 2-D echocardiogram showed normal ejection fraction and no abnormalities. Her d-dimer admission was also negative. Since discharge the hospital she has continued to have some chest pain with exertion shortness of breath and a sensation of heaviness on her chest. She has followup this afternoon with internal medicine and now she has a followup with a cardiologist. We spent quite a bit of time discussing the fact that the that if she truly wanted to have coronary disease was excluded she would need a cardiac catheterization. I told her quite frankly I think her symptoms are more likely related to stress and anxiety. There is also likely a musculoskeletal component given the fact that I can push on her chest and illicit chest pain although not the same chest pain that she is experiencing with heaviness.  We discussed all multiple other complaints including hurts is extreme fatigue. And as described below we opted to change her antiretroviral regimen to remove AZT in case this is compounding her problem.  She feels her mood is improved with Cymbalta but requests that I refill this with more more refills but she continued taking this medication.  She had additional concerns about not remembering much of her hospital  stay. I suggested this could have been due to excess narcotic and sedative medications that she might of been receiving as an inpatient.  In total we spent greater than 45 minutes with [name] including greater than 50% of time in counseling the patient and coordinate her care.   Review of Systems  Constitutional: Positive for activity change and fatigue. Negative for fever, chills, diaphoresis, appetite change and unexpected weight change.  HENT: Negative for congestion, sore throat, rhinorrhea, sneezing, trouble swallowing and sinus pressure.   Eyes: Negative for photophobia and visual disturbance.  Respiratory: Positive for shortness of breath. Negative for cough, chest tightness, wheezing and stridor.   Cardiovascular: Positive for chest pain. Negative for palpitations and leg swelling.  Gastrointestinal: Positive for nausea, vomiting, abdominal pain, diarrhea and constipation. Negative for blood in stool, abdominal distention and anal bleeding.  Genitourinary: Negative for dysuria, hematuria, flank pain, decreased urine volume and difficulty urinating.  Musculoskeletal: Positive for myalgias and arthralgias. Negative for back pain, joint swelling and gait problem.  Skin: Negative for color change, pallor, rash and wound.  Neurological: Positive for dizziness, light-headedness and numbness. Negative for tremors and weakness.  Hematological: Negative for adenopathy. Does not bruise/bleed easily.  Psychiatric/Behavioral: Negative for behavioral problems, confusion, sleep disturbance, dysphoric mood, decreased concentration and agitation.       Objective:   Physical Exam  Constitutional: She is oriented to person, place, and time. She appears well-developed and well-nourished. No distress.  HENT:  Head: Normocephalic and atraumatic.  Mouth/Throat: Oropharynx is clear and moist. No oropharyngeal exudate.  Eyes: Conjunctivae and EOM are normal.  Pupils are equal, round, and reactive to light.  No scleral icterus.  Neck: Normal range of motion. Neck supple. No JVD present.  Cardiovascular: Normal rate, regular rhythm and normal heart sounds.  Exam reveals no gallop and no friction rub.   No murmur heard. Pulmonary/Chest: Effort normal and breath sounds normal. No respiratory distress. She has no wheezes. She has no rales. She exhibits tenderness.  Abdominal: She exhibits no distension and no mass. There is tenderness. There is no rebound and no guarding.  Musculoskeletal: She exhibits edema. She exhibits no tenderness.  Lymphadenopathy:    She has no cervical adenopathy.  Neurological: She is alert and oriented to person, place, and time. She has normal reflexes. She exhibits normal muscle tone. Coordination normal.  Skin: Skin is warm and dry. No rash noted. She is not diaphoretic. No erythema. No pallor.  Psychiatric: Judgment and thought content normal. Her mood appears anxious. Her affect is not angry, not blunt, not labile and not inappropriate. Her speech is not rapid and/or pressured, not delayed, not tangential and not slurred. She is withdrawn. She is not agitated, not aggressive, is not hyperactive, not slowed, not actively hallucinating and not combative. Thought content is not paranoid and not delusional. Cognition and memory are not impaired. She does not express impulsivity or inappropriate judgment. She exhibits a depressed mood. She expresses no homicidal and no suicidal ideation. She expresses no suicidal plans and no homicidal plans. She exhibits abnormal recent memory. She exhibits normal remote memory. She is attentive.          Assessment & Plan:  HIV INFECTION I am going to simplify the ascites regimen to Prezista, Norvir and Truvada. This will only give fairly active drugs against her HIV her 180 4V mutation sensitizer virus to the tenofovir component of Truvada. I feel that in the context of a prudent protease inhibitor such as Prezista Norby her that we will be  able to achieve and maintain neurological suppression. This will simplify her regimen to once daily regimen. By removing the AZT we can potentially remove the side effect of fatigue that may be producing her although I think her fatigue is likely multifactorial in nature. I think she will do well in his neck on this regimen if needed we have other regimen other medications that we can reach forward to give her 3 active drugs but I really do think she'll be suppressed on Prezista Norvir and Truvada  HYPERLIPIDEMIA She states that she has been started on Crestor added this to her regimen. She will followup with internal medicine later today. Unfortunately her previous inhibitors are likely elevating her triglycerides and lipids but because of her resistant virus she does need a Protease inhibitor regimen to suppress her  DEPRESSION I have refilled her cymbalta along with refills. She may benefit from counselling  Chest pain This is very likely to be related to her anxiety. Certainly a cardiac catheter could be done to completely and definitively exclude coronary disease. I will defer this to internal medicine team and also any potential cardiology consult and divided then talked to. I don't think pulmonary embolism is very likely. Her d-dimer was negative.  Fatigue This is likely related to depression and other multiple medical problems. I am removing AZT in the form of Combivir from her antiretroviral regimen to see if this will help.

## 2011-01-03 NOTE — Assessment & Plan Note (Signed)
I am going to simplify the ascites regimen to Prezista, Norvir and Truvada. This will only give fairly active drugs against her HIV her 180 4V mutation sensitizer virus to the tenofovir component of Truvada. I feel that in the context of a prudent protease inhibitor such as Prezista Norby her that we will be able to achieve and maintain neurological suppression. This will simplify her regimen to once daily regimen. By removing the AZT we can potentially remove the side effect of fatigue that may be producing her although I think her fatigue is likely multifactorial in nature. I think she will do well in his neck on this regimen if needed we have other regimen other medications that we can reach forward to give her 3 active drugs but I really do think she'll be suppressed on Prezista Norvir and Truvada

## 2011-01-06 NOTE — Assessment & Plan Note (Signed)
Highly unlikely cardiac origin. During evaluation  CE  Were negaitve, ECG  was within normal limits, 2-D Echo showed a normal LV function with EF of 55%, Myoview per cardiology was negative. I would not consider to do a Cardiac cath at this point. Will hold of referral to cardiology since she was evaluated in the hospital and no follow up was recommended at that time.  Other DD include anxiety disorder or esophageal spasm especially with the history of pain relief with Nitro with no cardiac cause. Consider possible further evaluation which would refer to PCP.  A Nitro prescription was provided during this office visit.

## 2011-01-06 NOTE — Assessment & Plan Note (Signed)
Patient was started on Crestor. Will check LFT today.   Lab Results  Component Value Date   CHOL 252* 12/21/2010   CHOL 241* 12/14/2010   CHOL 225* 08/23/2010   Lab Results  Component Value Date   HDL 42 12/21/2010   HDL 37* 12/14/2010   HDL 35* 08/23/2010   Lab Results  Component Value Date   LDLCALC 155* 12/21/2010   LDLCALC Comment:   Not calculated due to Triglyceride >400. Suggest ordering Direct LDL (Unit Code: 21308).   Total Cholesterol/HDL Ratio:CHD Risk                        Coronary Heart Disease Risk Table                                        Men       Women          1/2 Average Risk              3.4        3.3              Average Risk              5.0        4.4           2X Average Risk              9.6        7.1           3X Average Risk             23.4       11.0 Use the calculated Patient Ratio above and the CHD Risk table  to determine the patient's CHD Risk. ATP III Classification (LDL):       < 100        mg/dL         Optimal      657 - 129     mg/dL         Near or Above Optimal      130 - 159     mg/dL         Borderline High      160 - 189     mg/dL         High       > 846        mg/dL         Very High   9/62/9528   LDLCALC 130* 08/23/2010   Lab Results  Component Value Date   TRIG 275* 12/21/2010   TRIG 451* 12/14/2010   TRIG 300* 08/23/2010   Lab Results  Component Value Date   CHOLHDL 6.0 12/21/2010   CHOLHDL 6.5 12/14/2010   CHOLHDL 6.4 08/23/2010   No results found for this basename: LDLDIRECT

## 2011-01-06 NOTE — Assessment & Plan Note (Addendum)
Chronic. Multifactorial. Per ID changes in medical regimen for HIV since AZT can cause fatigue. We will check LFT and CBC  today since patient has been started on Crestor during hospital admission. TSH was within normal limits. Hepatitis panel neg. Will continue to monitor. Depression may be contributing.

## 2011-01-06 NOTE — Progress Notes (Signed)
  Subjective:    Patient ID: [name], female    DOB: 07-Aug-1965, 45 y.o.   MRN: [id number]  HPI This is a 45 year old female with PMH significant for HIV, HLD, HTN, Depression, chronic pain syndrome who presented to the clinic for a hospital follow up where she was evaluated for chest pain. She noted that was experiencing chest pain on and off, a pressure like symptoms associated with pain in the left arm and left leg. There are no aggravating factor but she noticed that with Nitro the pain eases off and completely resolves. She noted she had no prescription for Nitro but during today's office visit with Dr Daiva Eves she noticed chest pain and was given Nitro which helped.  During evaluation in the hospital cardiac origine was ruled out with negative CE , ECG  Was within normal limits, 2-D Echo showed a normal LV function with EF of 55%, Myoview per cardiology was negative.  Patient was evaluated by Dr Daiva Eves for fatigue. This may be contributed by HIV medication and therefore changes were made by Dr Daiva Eves.    Review of Systems  Constitutional: Positive for activity change and appetite change. Negative for chills and fatigue.  HENT: Negative for facial swelling.   Respiratory: Negative for chest tightness, shortness of breath and wheezing.   Cardiovascular: Positive for chest pain and palpitations. Negative for leg swelling.  Gastrointestinal: Negative for abdominal distention.  Genitourinary: Negative for difficulty urinating and menstrual problem.  Musculoskeletal: Negative for arthralgias and gait problem.  Neurological: Positive for numbness. Negative for dizziness, weakness and light-headedness.       Objective:   Physical Exam  Constitutional: She appears well-developed.  Neck: Neck supple.  Cardiovascular: Regular rhythm.  Tachycardia present.   Pulmonary/Chest: Effort normal and breath sounds normal.  Abdominal: Soft. Bowel sounds are normal.  Musculoskeletal: Normal range of  motion.  Neurological: She is alert.  Skin: Skin is warm and dry.          Assessment & Plan:

## 2011-01-11 ENCOUNTER — Encounter: Payer: Medicaid Other | Admitting: Internal Medicine

## 2011-01-14 ENCOUNTER — Encounter: Payer: Self-pay | Admitting: Internal Medicine

## 2011-01-14 ENCOUNTER — Ambulatory Visit (INDEPENDENT_AMBULATORY_CARE_PROVIDER_SITE_OTHER): Payer: Medicaid Other | Admitting: Internal Medicine

## 2011-01-14 DIAGNOSIS — F329 Major depressive disorder, single episode, unspecified: Secondary | ICD-10-CM

## 2011-01-14 DIAGNOSIS — G894 Chronic pain syndrome: Secondary | ICD-10-CM

## 2011-01-14 DIAGNOSIS — G43909 Migraine, unspecified, not intractable, without status migrainosus: Secondary | ICD-10-CM

## 2011-01-14 MED ORDER — OXYCODONE-ACETAMINOPHEN 5-325 MG PO TABS: 1.0000 | ORAL_TABLET | Freq: Four times a day (QID) | ORAL | Status: DC | PRN

## 2011-01-14 MED ORDER — FLUCONAZOLE 100 MG PO TABS: 150.0000 mg | ORAL_TABLET | Freq: Every day | ORAL | Status: DC

## 2011-01-14 MED ORDER — ESOMEPRAZOLE MAGNESIUM 40 MG PO CPDR: 40.0000 mg | DELAYED_RELEASE_CAPSULE | Freq: Two times a day (BID) | ORAL | Status: DC

## 2011-01-14 MED ORDER — ALPRAZOLAM 2 MG PO TABS: ORAL_TABLET | ORAL | Status: DC

## 2011-01-14 MED ORDER — DICLOFENAC SODIUM 1 % TD GEL: 1.0000 "application " | Freq: Two times a day (BID) | TRANSDERMAL | Status: DC | PRN

## 2011-01-14 MED ORDER — ZOLMITRIPTAN 5 MG PO TABS: 5.0000 mg | ORAL_TABLET | Freq: Every day | ORAL | Status: DC | PRN

## 2011-01-14 MED ORDER — CARISOPRODOL 250 MG PO TABS: 250.0000 mg | ORAL_TABLET | Freq: Three times a day (TID) | ORAL | Status: DC | PRN

## 2011-01-14 MED ORDER — TERCONAZOLE 0.8 % VA CREA: 1.0000 | TOPICAL_CREAM | Freq: Every day | VAGINAL | Status: DC

## 2011-01-14 MED ORDER — DULOXETINE HCL 30 MG PO CPEP: 30.0000 mg | ORAL_CAPSULE | Freq: Every day | ORAL | Status: DC

## 2011-01-14 NOTE — Patient Instructions (Signed)
Schedule an appointment in 3 months. 

## 2011-01-14 NOTE — Assessment & Plan Note (Signed)
Pt reports doing well on cymbalta. We'll make no changes current medication regimen.

## 2011-01-14 NOTE — Progress Notes (Signed)
  Subjective:    Patient ID: [name], female    DOB: September 14, 1965, 45 y.o.   MRN: [id number]  HPI 45 yo female with PMH outlined below presents to [facility] for regular follow up appointment. She has been recently hospitalized for evaluation of chest pain and all the results were negative for ACS. She denies any new episodes of chest pain at home. No shortness of breat, no fevers or chills, no abd or urinary concerns.   Review of Systems Constitutional: Denies fever, chills, diaphoresis, appetite change and fatigue.  HEENT: Denies photophobia, eye pain, redness, hearing loss, ear pain, congestion, sore throat, rhinorrhea, sneezing, mouth sores, trouble swallowing, neck pain, neck stiffness and tinnitus.   Respiratory: Denies SOB, DOE, cough, chest tightness,  and wheezing.   Cardiovascular: Denies chest pain, palpitations and leg swelling.  Gastrointestinal: Denies nausea, vomiting, abdominal pain, diarrhea, constipation, blood in stool and abdominal distention.  Genitourinary: Denies dysuria, urgency, frequency, hematuria, flank pain and difficulty urinating.  Musculoskeletal: Denies myalgias, back pain, joint swelling, arthralgias and gait problem.  Skin: Denies pallor, rash and wound.  Neurological: Denies dizziness, seizures, syncope, weakness, light-headedness, numbness and headaches.  Hematological: Denies adenopathy. Easy bruising, personal or family bleeding history  Psychiatric/Behavioral: Denies suicidal ideation, mood changes, confusion, nervousness, sleep disturbance and agitation     Objective:   Physical Exam Constitutional: Vital signs reviewed.  Patient is a well-developed and well-nourished in no acute distress and cooperative with exam. Alert and oriented x3.  Neck: Supple, Trachea midline normal ROM, No JVD, mass, thyromegaly, or carotid bruit present.  Cardiovascular: RRR, S1 normal, S2 normal, no MRG, pulses symmetric and intact bilaterally Pulmonary/Chest: CTAB, no  wheezes, rales, or rhonchi Abdominal: Soft. Non-tender, non-distended, bowel sounds are normal, no masses, organomegaly, or guarding present.  Neurological: A&O x3, Strenght is normal and symmetric bilaterally, cranial nerve II-XII are grossly intact, no focal motor deficit, sensory intact to light touch bilaterally.  Skin: Warm, dry and intact. No rash, cyanosis, or clubbing.  Psychiatric: Normal mood and affect. speech and behavior is normal. Judgment and thought content normal. Cognition and memory are normal.          Assessment & Plan:

## 2011-01-14 NOTE — Assessment & Plan Note (Signed)
Provide refills on medication and no changes are going to be made today to current medication regimen.

## 2011-01-14 NOTE — Assessment & Plan Note (Addendum)
Provide refills on current medications. Patient's migraines are well controlled on current medication regimen.

## 2011-01-19 ENCOUNTER — Telehealth: Payer: Self-pay | Admitting: *Deleted

## 2011-01-19 NOTE — Telephone Encounter (Signed)
Called [phone] denied Crestor - needs to try Simvastatin x 2 months at highest dose or Pravastatin or Lovastain. Flag sent to Dr Aldine Contes. Stanton Kidney Cameo Schmiesing RN 01/19/11 2:45PM

## 2011-01-21 ENCOUNTER — Other Ambulatory Visit: Payer: Self-pay | Admitting: Internal Medicine

## 2011-01-21 MED ORDER — SIMVASTATIN 40 MG PO TABS: 40.0000 mg | ORAL_TABLET | Freq: Every day | ORAL | Status: DC

## 2011-01-21 NOTE — Telephone Encounter (Signed)
Addended byMliss Sax on: 01/21/2011 02:32 PM   Modules accepted: Orders

## 2011-02-04 ENCOUNTER — Ambulatory Visit: Payer: Medicaid Other | Admitting: Infectious Disease

## 2011-02-11 ENCOUNTER — Ambulatory Visit (INDEPENDENT_AMBULATORY_CARE_PROVIDER_SITE_OTHER): Payer: Medicaid Other | Admitting: Infectious Disease

## 2011-02-11 VITALS — BP 124/77 | HR 74 | Temp 97.7°F | Wt 232.5 lb

## 2011-02-11 DIAGNOSIS — F329 Major depressive disorder, single episode, unspecified: Secondary | ICD-10-CM

## 2011-02-11 DIAGNOSIS — E538 Deficiency of other specified B group vitamins: Secondary | ICD-10-CM

## 2011-02-11 DIAGNOSIS — F3289 Other specified depressive episodes: Secondary | ICD-10-CM

## 2011-02-11 DIAGNOSIS — E785 Hyperlipidemia, unspecified: Secondary | ICD-10-CM

## 2011-02-11 DIAGNOSIS — B20 Human immunodeficiency virus [HIV] disease: Secondary | ICD-10-CM

## 2011-02-11 DIAGNOSIS — R635 Abnormal weight gain: Secondary | ICD-10-CM

## 2011-02-11 DIAGNOSIS — G43909 Migraine, unspecified, not intractable, without status migrainosus: Secondary | ICD-10-CM

## 2011-02-11 LAB — COMPLETE METABOLIC PANEL WITH GFR
ALT: 11 U/L (ref 0–35)
Albumin: 4.5 g/dL (ref 3.5–5.2)
CO2: 27 mEq/L (ref 19–32)
GFR, Est African American: >60 mL/min (ref >60)
GFR, Est Non African American: >60 mL/min (ref >60)
Glucose, Bld: 85 mg/dL (ref 70–99)
Potassium: 4.3 mEq/L (ref 3.5–5.3)
Sodium: 140 mEq/L (ref 135–145)
Total Protein: 7.3 g/dL (ref 6.0–8.3)

## 2011-02-11 LAB — CBC WITH DIFFERENTIAL/PLATELET
Basophils Absolute: 0 10*3/uL (ref 0.0–0.1)
Basophils Relative: 0 % (ref 0–1)
Eosinophils Absolute: 0.1 10*3/uL (ref 0.0–0.7)
HCT: 42.7 % (ref 36.0–46.0)
MCH: 33 pg (ref 26.0–34.0)
MCHC: 34 g/dL (ref 30.0–36.0)
Monocytes Absolute: 0.2 10*3/uL (ref 0.1–1.0)
Monocytes Relative: 5 % (ref 3–12)
Neutro Abs: 2.4 10*3/uL (ref 1.7–7.7)
Neutrophils Relative %: 56 % (ref 43–77)
RDW: 12.1 % (ref 11.5–15.5)

## 2011-02-11 LAB — T-HELPER CELL (CD4) - (RCID CLINIC ONLY)
CD4 % Helper T Cell: 35 % (ref 33–55)
CD4 T Cell Abs: 590 uL (ref 400–2700)

## 2011-02-11 MED ORDER — CYANOCOBALAMIN 1000 MCG/ML IJ SOLN
1000.0000 ug | Freq: Once | INTRAMUSCULAR | Status: AC
  Administered 2011-02-11: 1000 ug via INTRAMUSCULAR

## 2011-02-11 MED ORDER — PROMETHAZINE HCL 50 MG PO TABS: 25.0000 mg | ORAL_TABLET | Freq: Four times a day (QID) | ORAL | Status: DC | PRN

## 2011-02-11 MED ORDER — DULOXETINE HCL 30 MG PO CPEP: 30.0000 mg | ORAL_CAPSULE | Freq: Every day | ORAL | Status: DC

## 2011-02-12 MED ORDER — ROSUVASTATIN CALCIUM 20 MG PO TABS: 20.0000 mg | ORAL_TABLET | Freq: Every day | ORAL | Status: DC

## 2011-02-12 NOTE — Assessment & Plan Note (Signed)
Indeeed much of pts problems with cholesterol and TG may be related to her ritonavir and prezista. However her recent ARV hx and accumulation of R mutations, prior nocompliance and problems with N/V make a NON PI based regimen for her HIV more risky. SHE CANNOT HOWEVER HVE THIS HYPERLIPIDEIMIA TREATED WITH ZOCOR NO MATTER WHAT MEDICAID IS SAYING WITH RE TO THEIR FORMULARY AS IT IS ABSOLUATELY CONTRAINDICATED WITH NORVIR. I WILL REWRITE SCRIPT  FOR CRESTOR AT 20MG  DOSE. MAY NEED TO ADD IN A NIACIN, FISH OIL. I AM HAPPY TO WRITE A LETTER TO MEDICAID IF NEED BE BUT THIS DRUG INTERATION IS EASY TO FIND IN ANY DRUG REFERENE MANUAL

## 2011-02-12 NOTE — Assessment & Plan Note (Signed)
Refilled cymbalta, I asked her to meet with [facility] counselor as well

## 2011-02-12 NOTE — Assessment & Plan Note (Signed)
topamax still being prescribed

## 2011-02-12 NOTE — Assessment & Plan Note (Addendum)
Recheck viral load, CD4 today. Should be suppressed though we are only using technically two fully active agents (one being very potent one though). SEE DISCUSSION Below re PI's and lipids. I am not comfortable moving her to a NON PI based regimen. Certainly I would need to be convinced of her compliance and be sure there would be no issues of absorption due to her N,V. Also she would absolutely need to be on 3 active meds

## 2011-02-12 NOTE — Progress Notes (Signed)
Subjective:    Patient ID: [name], female    DOB: 23-Sep-1965, 45 y.o.   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 Since doing so her fatigue has improved. She still takes phenergan for nausea and asks for refill. She claims to take tow 50mg  tablets prior to evening ARV and i asked her not to do this as this exceeds the maximum dose. She also asked for refill on cymbalta. i have noticed that he patient was taken off of crestor and placed on zocor due to medicaid not wanting to pay for crestor. NOTE, MS Mccarey CANNOT BE ON ZOCOR WHILE ON NORVIR. USE OF ZOCOR AND RITONOAVIR IS STRICTLY CONTRA-INDICATED DUE TO HIGH LEVELS OF STATIN INDUCED BY NORVIR THAT PUT PT AT RISK OF RHABDO. I will change her back to crestor. There is similar problem in increase in this statin due to ritonavir but the risk is not at the level of zocor. I called the patient on Saturday to make this change. I spent greater tahn 45 minutes with the patient including greater than 50% of time in face to face counselling and in coordination of care.    Review of Systems  Constitutional: Negative for fever, chills, diaphoresis, activity change, appetite change, fatigue and unexpected weight change.  HENT: Negative for congestion, sore throat, rhinorrhea, sneezing, trouble swallowing and sinus pressure.   Eyes: Negative for photophobia and visual disturbance.  Respiratory: Negative for cough, chest tightness, shortness of breath, wheezing and stridor.   Cardiovascular: Negative for chest pain, palpitations and leg swelling.  Gastrointestinal: Negative for nausea, vomiting, abdominal pain, diarrhea,  constipation, blood in stool, abdominal distention and anal bleeding.  Genitourinary: Negative for dysuria, hematuria, flank pain and difficulty urinating.  Musculoskeletal: Negative for myalgias, back pain, joint swelling, arthralgias and gait problem.  Skin: Negative for color change, pallor, rash and wound.  Neurological: Positive for weakness, light-headedness and headaches. Negative for dizziness and tremors.  Hematological: Negative for adenopathy. Does not bruise/bleed easily.  Psychiatric/Behavioral: Positive for dysphoric mood. Negative for behavioral problems, confusion, sleep disturbance, decreased concentration and agitation.       Objective:   Physical Exam  Constitutional: She is oriented to person, place, and time. She appears well-developed and well-nourished. No distress.  HENT:  Head: Normocephalic and atraumatic.  Mouth/Throat: Oropharynx is clear and moist. No oropharyngeal exudate.  Eyes: Conjunctivae and EOM are normal. Pupils are equal, round, and reactive to light. No scleral icterus.  Neck: Normal range of motion. Neck supple. No JVD present.  Cardiovascular: Normal rate, regular rhythm and normal heart sounds.  Exam reveals no gallop and no friction rub.   No murmur heard. Pulmonary/Chest: Effort normal and breath sounds normal. No respiratory distress. She has no wheezes. She has no rales. She exhibits no tenderness.  Abdominal: She exhibits no distension and no mass. There is no tenderness. There is no rebound and no guarding.  Musculoskeletal: She exhibits no edema and no tenderness.  Lymphadenopathy:    She has no cervical adenopathy.  Neurological: She is alert and oriented to person, place, and time. She has normal reflexes. She exhibits normal muscle tone. Coordination normal.  Skin: Skin is warm  and dry. She is not diaphoretic. No erythema. No pallor.  Psychiatric: Her behavior is normal. Judgment and thought content normal.       Slightly blunted afffect  but in better spirits          Assessment & Plan:

## 2011-02-12 NOTE — Assessment & Plan Note (Signed)
She is concerned about weight gain despite reduced eating. Will check cortisol and TSH

## 2011-02-14 LAB — HIV-1 RNA ULTRAQUANT REFLEX TO GENTYP+: HIV-1 RNA Quant, Log: <1.3 {Log} (ref <1.30)

## 2011-02-15 NOTE — Discharge Summary (Signed)
[name], [name] NO.:  [id number]  MEDICAL RECORD NO.:  [id number]  LOCATION:  4729                         FACILITY:  [facility]  PHYSICIAN:  Ileana Roup, M.D.  DATE OF BIRTH:  January 21, 1966  DATE OF ADMISSION:  12/20/2010 DATE OF DISCHARGE:  12/23/2010                              DISCHARGE SUMMARY   DISCHARGE DIAGNOSES: 1. Chest pain radiating to left arm.  The patient was negative for     elevation of cardiac enzymes and no acute EKG changes, and had a     normal echocardiogram showing EF of 60%.  No regional wall     abnormalities, and a negative Myoview stress echocardiogram showing     normal LVEF and no pharmacologic stress induced ischemia. 2. Dyslipidemia.  The patient was started on rosuvastatin 10 mg at     bedtime, and will continue to take aspirin 81 mg daily. 3. Depression and anxiety.  The patient has many ongoing psychosocial     stressors in her life, and has a history of panic attacks.  She is     currently taking duloxetine and Xanax for management of this     problem. 4. Human immunodeficiency virus, well controlled on four     antiretroviral drugs.  CD4 count 490 and viral load low and     undetectable as of December 14, 2010. 5. Migraine is controlled with sumatriptan and Topamax p.r.n. 6. Chronic back pain controlled with Percocet and carisoprodol. 7. Gastroesophageal reflux disease on Nexium. 8. Irritable bowel syndrome. 9. Methicillin-resistant Staphylococcus aureus colonization.  The     patient is taking doxycycline 100 mg b.i.d. per Dr. Daiva Eves.  DISCHARGE MEDICATIONS:  She is taking: 1. Percocet 5/325 mg 1 tablet q.6 h. p.r.n. 2. Lubiprostone 8 mcg 1 tablet b.i.d. 3. Fluoxetine 30 mg b.i.d. 4. Nitroglycerine p.r.n. 5. Nexium 40 mg b.i.d. 6. Topamax 100 mg b.i.d. 7. Zolmitriptan 5 mg daily p.r.n. 8. Tenofovir 300 mg daily. 9. Ritonavir 100 mg daily. 10.Combivir 150/300 two tablets daily. 11.Darunavir 400 mg 2 tablets  daily. 12.Carisoprodol 250 mg t.i.d. p.r.n. 13.Aspirin 81 mg daily. 14.Xanax 2 mg t.i.d. 15.Promethazine 50 mg tablets 2 tablets each day p.r.n. 16.Doxycycline 100 mg b.i.d. for 10 days. 17.Rosuvastatin 10 mg nightly.  DISPOSITION AND FOLLOWUP:  The patient is to follow up at [facility] outpatient clinic on January 03, 2011, at 3:45 p.m. with Dr. Loistine Chance.  At this appointment, please check for any continuing chest pain and the need to use nitroglycerine to resolve any ongoing chest pain.  Also, please check the patient's liver function enzymes given that she was started on a statin during this hospitalization.  PROCEDURES:  The patient had multiple EKGs, which were negative for acute changes.  On December 20, 2010, she had a chest x-ray that revealed no acute significant findings.  On December 21, 2010, she had an echocardiogram, showed 60% EF with no regional wall abnormalities and a normal cavity size.  On December 22, 2010, she had a Myoview stress echo, which was negative for pharmacologic stress induced ischemia, showed an left ventricular ejection fraction of 54% and a fixed anteroseptal defect that  was read as either attenuation versus infarction.  CONSULTATION:  Cardiology was consulted with the chest pain given her risk factors of dyslipidemia and HIV.  BRIEF ADMITTING HISTORY AND PHYSICAL:  The patient is a 45 year old female with past medical history of HIV infection, non-Hodgkin's who presented with 1-2 weeks of ongoing chest pain.  The patient was brought to the ED after Infectious Disease Clinic appointment and complained her physician about intermittent crushing chest pain in the sternal region that radiates down her left arm with numbness and tingling.  She was given nitroglycerin in the ID and her pain resolved after the second dose.  She also reports a cough that is always present related to chronic bronchitis and says it has worsened over the last week.  Of note, the patient's  main complaint at her ID Clinic appointment today was depression, and she also has a significant history of anxiety attacks during which she reports the chest pain is associated with racing heart, hyperventilation, and sweating.  She has these quite common and takes Xanax 3 times a day and an SSRI in an attempt to control it.  Her psychosocial and family situation is extremely stressful at this point, and she reports that she feels fatigued all the time and is having difficulty coping, but denies suicide ideation. Lastly, she has chronic neuropathy  bilaterally in both upper and lower extremities as well as lymphedema on left side including her left leg, left thigh, abdomen, left chest, and left arm in addition to daily migraine headaches.  LABORATORY DATA ON ADMISSION:  CD4 count 940, HIV RNA less than 20. White blood cell count 5.3, hemoglobin 13.6, hematocrit 38.3, platelet count 140.  Sodium 139, potassium 3.5, chloride 98, bicarb 31, glucose 87, BUN 11, creatinine 0.72, troponin less than 0.30, creatine kinase 58, and then CK-MB is 1.6.  Her A1c was 5.1, TSH 1.248, D-dimer 0.022, bilirubin 0.4, alk phos 63, AST 18, ALT 14, total protein 2.2, albumin 3.8.  Cholesterol 252, triglyceride 275, HDL 42, LDL is 155.  HOSPITAL COURSE: 1. Chest pain.  The patient was admitted and EKGs and cardiac enzymes     were ordered, which were all negative for any signs of acute     coronary syndrome.  Other labs were ordered including TSH, D-dimer,     and given low probability of PE as well as fasting lipid panel and     A1c.  All were normal with the exception of the fasting lipid     panel, which showed dyslipidemia.  Given the history of radiation     and given the chest pain, an echo was ordered to rule out     cardiomyopathy induced from radiation from 7 years ago and to     assess cardiac output.  This proved to be negative study and showed     EF of 50%.  No regional wall abnormalities.   Nevertheless given her     ongoing complaints of chest pain, a Cardiology consult was ordered.     They recommended and carried out a Myoview stress echo, which     showed no pharmacologic stress induced ischemia.  The patient was     started on rosuvastatin, counseled to continue aspirin and given     nitroglycerin p.r.n. for any future episodes of pain. 2. Dyslipidemia.  The patient is on rosuvastatin 10 mg at bedtime. 3. Depression and anxiety.  Continue duloxetine 30 mg b.i.d. and Xanax     2 mg  t.i.d. 4. HIV.  Continue outpatient medical regimen per Dr. Daiva Eves. 5. Migraines, controlled with zolmitriptan and Topamax p.r.n. 6. Chronic back pain, controlled with Percocet and the muscle relaxant     carisoprodol. 7. GERD, controlled with Nexium. 8. Irritable bowel syndrome.  The patient is on lubiprostone as well     as Phenergan for nausea. 9. MRSA colonization.  Dr. Daiva Eves has turned the patient on     doxycycline 100 mg b.i.d.  DISCHARGE VITAL SIGNS AND LABORATORY DATA:  On December 23, 2010, at 5:00 a.m., the patient was afebrile with temperature of 97.5, pulse 60, respiratory rate 18, blood pressure 97/64, satting at 94% on room air. Sodium 139, potassium 3.4, chloride 103, bicarb 28, glucose 81, BUN 13, creatinine 0.72, bilirubin 0.2, alk phos 67, AST 14, ALT 12, total protein 6.2, albumin 3.3, calcium 8.3.  White blood cell count 4.3, hemoglobin 12.2, hematocrit 35.2, MCV is elevated at 100.6, platelet count 133.     Mliss Sax, MD   ______________________________ Ileana Roup, M.D.    IM/MEDQ  D:  12/23/2010  T:  12/24/2010  Job:  [id number]  cc:   Almyra Deforest, MD Acey Lav, MD  Electronically Signed by Mliss Sax MD on 01/07/2011 10:18:37 AM Electronically Signed by Margarito Liner M.D. on 02/15/2011 05:53:49 PM

## 2011-03-01 ENCOUNTER — Ambulatory Visit (INDEPENDENT_AMBULATORY_CARE_PROVIDER_SITE_OTHER): Payer: Medicaid Other | Admitting: Internal Medicine

## 2011-03-01 ENCOUNTER — Encounter: Payer: Self-pay | Admitting: Internal Medicine

## 2011-03-01 VITALS — BP 103/70 | HR 70 | Temp 96.7°F | Ht 66.0 in | Wt 231.9 lb

## 2011-03-01 DIAGNOSIS — Z299 Encounter for prophylactic measures, unspecified: Secondary | ICD-10-CM

## 2011-03-01 DIAGNOSIS — F329 Major depressive disorder, single episode, unspecified: Secondary | ICD-10-CM

## 2011-03-01 DIAGNOSIS — K219 Gastro-esophageal reflux disease without esophagitis: Secondary | ICD-10-CM

## 2011-03-01 DIAGNOSIS — G43909 Migraine, unspecified, not intractable, without status migrainosus: Secondary | ICD-10-CM

## 2011-03-01 DIAGNOSIS — E538 Deficiency of other specified B group vitamins: Secondary | ICD-10-CM

## 2011-03-01 DIAGNOSIS — Z23 Encounter for immunization: Secondary | ICD-10-CM

## 2011-03-01 MED ORDER — SCOPOLAMINE 1 MG/3DAYS TD PT72: 1.0000 | MEDICATED_PATCH | TRANSDERMAL | Status: DC | PRN

## 2011-03-01 MED ORDER — LUBIPROSTONE 8 MCG PO CAPS: 8.0000 ug | ORAL_CAPSULE | Freq: Two times a day (BID) | ORAL | Status: DC

## 2011-03-01 NOTE — Assessment & Plan Note (Signed)
Refills for migraine headaches were provided on last visit. Patient has chronic headaches which appear to be well-controlled on current medication regimen. No changes will be made today.

## 2011-03-01 NOTE — Assessment & Plan Note (Signed)
Well-controlled on Cymbalta. We will continue Cymbalta 60 mg tablet once daily.

## 2011-03-01 NOTE — Assessment & Plan Note (Signed)
Refill for Nexium provided on last visit. 11 refills provided. We will continue same medication regimen.

## 2011-03-01 NOTE — Progress Notes (Signed)
  Subjective:    Patient ID: [name], female    DOB: [birth year], 45 y.o.   MRN: [id number]  HPI  Patient is 45 year old female with past medical history outlined below who presents to clinic for regular followup. She denies recent sicknesses or hospitalizations, denies episodes of chest pain or shortness of breath, denies abdominal or urinary concerns. She requires refills on her medications.  Review of Systems Constitutional: Denies fever, chills, diaphoresis, appetite change and fatigue.  HEENT: Denies photophobia, eye pain, redness, hearing loss, ear pain, congestion, sore throat, rhinorrhea, sneezing, mouth sores, trouble swallowing, neck pain, neck stiffness and tinnitus.   Respiratory: Denies SOB, DOE, cough, chest tightness,  and wheezing.   Cardiovascular: Denies chest pain, palpitations and leg swelling.  Gastrointestinal: Denies nausea, vomiting, abdominal pain, diarrhea, constipation, blood in stool and abdominal distention.  Genitourinary: Denies dysuria, urgency, frequency, hematuria, flank pain and difficulty urinating.  Musculoskeletal: Denies myalgias, back pain, joint swelling, arthralgias and gait problem.  Skin: Denies pallor, rash and wound.  Neurological: Denies dizziness, seizures, syncope, weakness, light-headedness, numbness and headaches.  Hematological: Denies adenopathy. Easy bruising, personal or family bleeding history  Psychiatric/Behavioral: Denies suicidal ideation, mood changes, confusion, nervousness, sleep disturbance and agitation      Objective:   Physical Exam  Constitutional: Vital signs reviewed.  Patient is a well-developed and well-nourished in no acute distress and cooperative with exam. Alert and oriented x3.  Head: Normocephalic and atraumatic Ear: TM normal bilaterally Mouth: no erythema or exudates, MMM Eyes: PERRL, EOMI, conjunctivae normal, No scleral icterus.  Neck: Supple, Trachea midline normal ROM, No JVD, mass, thyromegaly, or  carotid bruit present.  Cardiovascular: RRR, S1 normal, S2 normal, no MRG, pulses symmetric and intact bilaterally Pulmonary/Chest: CTAB, no wheezes, rales, or rhonchi Abdominal: Soft. Non-tender, non-distended, bowel sounds are normal, no masses, organomegaly, or guarding present.  Psychiatric: Normal mood and affect. speech and behavior is normal. Judgment and thought content normal. Cognition and memory are normal.         Assessment & Plan:

## 2011-03-01 NOTE — Progress Notes (Signed)
Thigh hi  TED were mailed to pt per Dr Aldine Contes. Length 31" and calf 15.5". Stanton Kidney Ditzler RN 03/01/11 1:50PM

## 2011-03-08 ENCOUNTER — Other Ambulatory Visit: Payer: Self-pay | Admitting: *Deleted

## 2011-03-08 MED ORDER — RITONAVIR 100 MG PO TABS: 100.0000 mg | ORAL_TABLET | Freq: Every day | ORAL | Status: DC

## 2011-03-08 NOTE — Telephone Encounter (Signed)
Patient somehow had norvir stopped by Dr. Nino Parsley. This patients Antivirals should ONLY be adjusted by ID MD. Please do not change antivirals without talking to ID PCP or ID MD on call. I sure hope she has been taking the norvir this entire time. Even the prezista, norvir and truvada IS NOT A FULLY ACTIVE REGIMEN, so I AM ALREADY TAKING A RISK CHANGING HER TO THIS REGIMEN. PLEASE DONT ADJUST ARVS without talkgint to me thanks!

## 2011-03-09 ENCOUNTER — Other Ambulatory Visit: Payer: Self-pay | Admitting: Licensed Clinical Social Worker

## 2011-03-09 DIAGNOSIS — B20 Human immunodeficiency virus [HIV] disease: Secondary | ICD-10-CM

## 2011-03-09 MED ORDER — DARUNAVIR ETHANOLATE 400 MG PO TABS: 800.0000 mg | ORAL_TABLET | Freq: Two times a day (BID) | ORAL | Status: DC

## 2011-03-09 NOTE — Telephone Encounter (Signed)
Spoke with pharmacist and patient did receive correct regimen. Wendall Mola CMA

## 2011-03-31 LAB — T-HELPER CELLS (CD4) COUNT (NOT AT ARMC): CD4 % Helper T Cell: 29 — ABNORMAL LOW

## 2011-04-01 ENCOUNTER — Telehealth: Payer: Self-pay | Admitting: *Deleted

## 2011-04-01 NOTE — Telephone Encounter (Addendum)
Pt would like to get # 60  Cymbalta at a time instead of the ordered 30 per month.

## 2011-04-04 LAB — CBC
HCT: 41.1
Hemoglobin: 13.7
RBC: 4.54
RDW: 16.5 — ABNORMAL HIGH
WBC: 5.4

## 2011-04-05 LAB — T-HELPER CELLS (CD4) COUNT (NOT AT ARMC): CD4 % Helper T Cell: 34

## 2011-04-06 NOTE — Telephone Encounter (Signed)
She can discuss with PCP at next visit-this would be a 2 month supply-could order as 3 moth supply if pharmacy ok with that.

## 2011-04-06 NOTE — Telephone Encounter (Signed)
No urgency to this request- I worry about her doubling up on medications/self medicating

## 2011-04-08 LAB — GLUCOSE, CAPILLARY: Glucose-Capillary: 108 mg/dL — ABNORMAL HIGH (ref 70–99)

## 2011-04-11 LAB — T-HELPER CELL (CD4) - (RCID CLINIC ONLY): CD4 % Helper T Cell: 34

## 2011-04-18 ENCOUNTER — Other Ambulatory Visit: Payer: Self-pay | Admitting: Obstetrics & Gynecology

## 2011-04-18 DIAGNOSIS — E669 Obesity, unspecified: Secondary | ICD-10-CM

## 2011-04-18 DIAGNOSIS — Z1231 Encounter for screening mammogram for malignant neoplasm of breast: Secondary | ICD-10-CM

## 2011-04-18 LAB — T-HELPER CELLS (CD4) COUNT (NOT AT ARMC): CD4 T Cell Abs: 660

## 2011-04-27 ENCOUNTER — Ambulatory Visit (HOSPITAL_COMMUNITY): Admission: RE | Admit: 2011-04-27 | Payer: Medicaid Other | Source: Ambulatory Visit

## 2011-05-04 ENCOUNTER — Ambulatory Visit (HOSPITAL_COMMUNITY)
Admission: RE | Admit: 2011-05-04 | Discharge: 2011-05-04 | Disposition: A | Discharge status: Home / Self Care | Payer: Medicaid Other | Source: Ambulatory Visit | Attending: Obstetrics & Gynecology | Admitting: Obstetrics & Gynecology

## 2011-05-04 DIAGNOSIS — E669 Obesity, unspecified: Secondary | ICD-10-CM

## 2011-05-04 DIAGNOSIS — Z13820 Encounter for screening for osteoporosis: Secondary | ICD-10-CM | POA: Insufficient documentation

## 2011-05-06 ENCOUNTER — Encounter: Payer: Medicaid Other | Admitting: Internal Medicine

## 2011-05-13 ENCOUNTER — Telehealth: Payer: Self-pay | Admitting: *Deleted

## 2011-05-13 NOTE — Telephone Encounter (Signed)
Called [phone] - denied Zomig - needs to try Maxalt MLT or Sumatriptan #12 tab for a least 1 month per insurance company. Flag sent to Dr Abner Greenspan. Stanton Kidney Stepen Prins RN 05/13/11 11:10AM

## 2011-05-17 ENCOUNTER — Other Ambulatory Visit: Payer: Self-pay | Admitting: Internal Medicine

## 2011-05-17 MED ORDER — SUMATRIPTAN SUCCINATE 50 MG PO TABS: 50.0000 mg | ORAL_TABLET | ORAL | Status: DC | PRN

## 2011-05-19 ENCOUNTER — Telehealth: Payer: Self-pay | Admitting: Oncology

## 2011-05-19 NOTE — Telephone Encounter (Signed)
Called pt to advised 12/14 md appt has been cx'd due to Epic. I was unable to reach pt by phn because she does not have a working number.

## 2011-05-21 ENCOUNTER — Telehealth: Payer: Self-pay | Admitting: Nurse Practitioner

## 2011-05-21 NOTE — Telephone Encounter (Signed)
S/w pt, gave appt 07/08/11 @ 10.15am. Advised pt to keep 06/10/11 appts.

## 2011-06-10 ENCOUNTER — Other Ambulatory Visit: Payer: Self-pay | Admitting: Oncology

## 2011-06-10 ENCOUNTER — Ambulatory Visit (HOSPITAL_COMMUNITY)
Admission: RE | Admit: 2011-06-10 | Discharge: 2011-06-10 | Disposition: A | Discharge status: Home / Self Care | Payer: Medicaid Other | Source: Ambulatory Visit | Attending: Oncology | Admitting: Oncology

## 2011-06-10 ENCOUNTER — Other Ambulatory Visit (HOSPITAL_BASED_OUTPATIENT_CLINIC_OR_DEPARTMENT_OTHER): Payer: Medicaid Other | Admitting: Lab

## 2011-06-10 DIAGNOSIS — Z86718 Personal history of other venous thrombosis and embolism: Secondary | ICD-10-CM

## 2011-06-10 DIAGNOSIS — C8590 Non-Hodgkin lymphoma, unspecified, unspecified site: Secondary | ICD-10-CM

## 2011-06-10 DIAGNOSIS — Z87898 Personal history of other specified conditions: Secondary | ICD-10-CM

## 2011-06-10 DIAGNOSIS — D72829 Elevated white blood cell count, unspecified: Secondary | ICD-10-CM

## 2011-06-10 DIAGNOSIS — C8589 Other specified types of non-Hodgkin lymphoma, extranodal and solid organ sites: Secondary | ICD-10-CM | POA: Insufficient documentation

## 2011-06-10 DIAGNOSIS — B20 Human immunodeficiency virus [HIV] disease: Secondary | ICD-10-CM

## 2011-06-10 LAB — CBC WITH DIFFERENTIAL/PLATELET
BASO%: 0.4 % (ref 0.0–2.0)
Eosinophils Absolute: 0.1 10*3/uL (ref 0.0–0.5)
LYMPH%: 38.8 % (ref 14.0–49.7)
MCHC: 34.1 g/dL (ref 31.5–36.0)
MONO#: 0.2 10*3/uL (ref 0.1–0.9)
NEUT#: 2.2 10*3/uL (ref 1.5–6.5)
RBC: 4.59 10*6/uL (ref 3.70–5.45)
RDW: 14.1 % (ref 11.2–14.5)
WBC: 4.1 10*3/uL (ref 3.9–10.3)

## 2011-06-10 LAB — COMPREHENSIVE METABOLIC PANEL
ALT: 10 U/L (ref 0–35)
Alkaline Phosphatase: 61 U/L (ref 39–117)
CO2: 31 mEq/L (ref 19–32)
Creatinine, Ser: 0.74 mg/dL (ref 0.50–1.10)
Glucose, Bld: 77 mg/dL (ref 70–99)
Sodium: 140 mEq/L (ref 135–145)
Total Bilirubin: 0.3 mg/dL (ref 0.3–1.2)
Total Protein: 7.2 g/dL (ref 6.0–8.3)

## 2011-06-10 LAB — MORPHOLOGY: PLT EST: ADEQUATE

## 2011-06-10 LAB — SEDIMENTATION RATE: Sed Rate: 8 mm/hr (ref 0–22)

## 2011-06-10 LAB — LACTATE DEHYDROGENASE: LDH: 122 U/L (ref 94–250)

## 2011-06-14 ENCOUNTER — Ambulatory Visit (INDEPENDENT_AMBULATORY_CARE_PROVIDER_SITE_OTHER): Payer: Medicaid Other | Admitting: Internal Medicine

## 2011-06-14 ENCOUNTER — Encounter: Payer: Self-pay | Admitting: Internal Medicine

## 2011-06-14 DIAGNOSIS — M545 Low back pain, unspecified: Secondary | ICD-10-CM

## 2011-06-14 DIAGNOSIS — R05 Cough: Secondary | ICD-10-CM

## 2011-06-14 DIAGNOSIS — E538 Deficiency of other specified B group vitamins: Secondary | ICD-10-CM

## 2011-06-14 DIAGNOSIS — M899 Disorder of bone, unspecified: Secondary | ICD-10-CM

## 2011-06-14 DIAGNOSIS — G894 Chronic pain syndrome: Secondary | ICD-10-CM

## 2011-06-14 DIAGNOSIS — F3289 Other specified depressive episodes: Secondary | ICD-10-CM

## 2011-06-14 DIAGNOSIS — F329 Major depressive disorder, single episode, unspecified: Secondary | ICD-10-CM

## 2011-06-14 DIAGNOSIS — R339 Retention of urine, unspecified: Secondary | ICD-10-CM

## 2011-06-14 DIAGNOSIS — R059 Cough, unspecified: Secondary | ICD-10-CM

## 2011-06-14 DIAGNOSIS — R635 Abnormal weight gain: Secondary | ICD-10-CM

## 2011-06-14 DIAGNOSIS — B20 Human immunodeficiency virus [HIV] disease: Secondary | ICD-10-CM

## 2011-06-14 DIAGNOSIS — E785 Hyperlipidemia, unspecified: Secondary | ICD-10-CM

## 2011-06-14 DIAGNOSIS — M8580 Other specified disorders of bone density and structure, unspecified site: Secondary | ICD-10-CM | POA: Insufficient documentation

## 2011-06-14 DIAGNOSIS — G43909 Migraine, unspecified, not intractable, without status migrainosus: Secondary | ICD-10-CM

## 2011-06-14 MED ORDER — IPRATROPIUM-ALBUTEROL 18-103 MCG/ACT IN AERO: 2.0000 | INHALATION_SPRAY | Freq: Four times a day (QID) | RESPIRATORY_TRACT | Status: DC | PRN

## 2011-06-14 MED ORDER — ZOLMITRIPTAN 5 MG PO TABS: 5.0000 mg | ORAL_TABLET | ORAL | Status: DC | PRN

## 2011-06-14 MED ORDER — DULOXETINE HCL 30 MG PO CPEP: 90.0000 mg | ORAL_CAPSULE | Freq: Every day | ORAL | Status: DC

## 2011-06-14 MED ORDER — CALCIUM-VITAMIN D 500-200 MG-UNIT PO TABS: 1.0000 | ORAL_TABLET | Freq: Every day | ORAL | Status: DC

## 2011-06-14 NOTE — Progress Notes (Signed)
  Subjective:    Patient ID: [name], female    DOB: 09/23/1965, 45 y.o.   MRN: [id number]  HPI Patient is a 11 old female with extensive past medical history including HIV, history of non-Hodgkin's lymphoma, depression, chronic migraines, recent urinary retention, and chronic lower back pain who comes for followup of multiple medical issues. #Lower back pain: The patient continues to take 4 Percocet per day, and feels that her lower back pain is stable. She has gained some weight recently and notes that it is harder to exercise. She is working to get back into the gym.  #Urinary retention: Patient says that over the past year she has had a few UTIs and one episode of pyelonephritis, and has slowly been having a harder and harder time urinating. She says that she has to lean forward and slowly let a few drops out, and often has to return to the toilet immediately after finishing. She has not had nay vaginal births, and her radiation therapy from her NHL was to her upper left extremity, not her pelvic region. She says that her gynecologist that she should get referred to a urologist.  #Migraine headaches: The patient has had migraine headaches with aura since she was 45 years old, and has had trials of multiple medical therapies over the years. She is currently taking Topamax and a triptan (had been on Zomig, but had to undergo a trial of Imitrex for insurance clearance) for abortive therapy. She's interested in referral to neurology for further medical therapies as well as possible injections.  #Bone density: The patient is here to followup on the results of her bone density scan.  #Depression: Patient was restarted on Cymbalta by Dr. Zenaida Niece dam, and she says it is helping her. She denies SI/HI, but is interested in increasing her dose of Cymbalta from 60 mg to 90 mg.  #NHL: Patient explains that she saw her oncologist Dr. Oletta Lamas last week, for continued surveillance for recurrence of her  NHL.   Review of Systems     Objective:   Physical Exam Physical Exam GEN: NAD.  Alert and oriented x 3.  Pleasant, conversant, and cooperative to exam. HEENT: TM on R with whole in center (pt had tube), TM on L clear.  Oropharynx is clear w/o exudate or erythema. RESP:  CTAB, no w/r/r CARDIOVASCULAR: RRR, S1, S2, no m/r/g ABDOMEN: soft, NT/ND, NABS EXT: warm and dry. No edema in b/l LE        Assessment & Plan:

## 2011-06-14 NOTE — Assessment & Plan Note (Addendum)
Patient with first ever DEXA scan this October, showing lumbar Z score of 2.3 but normal T. scores. Given her risk factors I prescribed her calcium and vitamin D, but will touch base with the doctor who ordered a DEXA scan to discuss these results. Regardless, she should have a repeat DEXA in 2 years. She may need a 25 vitamin D level measured as well, but I did not order it at this time.

## 2011-06-14 NOTE — Assessment & Plan Note (Signed)
Patient appears to be decently-controlled, denies SI/HI, feeling a little bit down in this holiday season. Asked to increase from 60 g to 90 mg, which I allowed for the time being. She also said that she would like to stop taking Cymbalta after the new year, and we discussed not stopping it suddenly, and that a taper would likely be required.

## 2011-06-14 NOTE — Assessment & Plan Note (Signed)
At the advice of her gynecologist, the patient would like to see a urologist for treatment of her year long progressively worsening urinary retention. She does not have any vaginal births, and no radiation exposure to the pelvis. She describes post void fullness, difficulty with starting urination, and urinary frequency. We'll get a urinalysis today to assess for any infection, and refer to urology for management. The patient is a bit fixated on seeing a urologist given that one of her other providers said this would be a good idea. She may have urinary retention secondary to a medicine, and she does take many medicines. We will appreciate urology assistance with this problem.

## 2011-06-14 NOTE — Assessment & Plan Note (Signed)
Patient with chronic migraines with aura since age 45. Appears to be poorly controlled with multiple headaches per week. Had been on Zomig with some success when mixed with Topamax. Had to be trialed on Imitrex for insurance reasons. The patient was on a trial over the past 4 weeks, and says she did not have the same relief that she has was Zomig. I represcribed her Zomig today. Additionally, I have referred her to neurology for additional assessment of her migraines. She says that she has tried beta-blockade in the past for prophylaxis with no success, but she would benefit from a daily prophylactic migraine medicine if we could find one that will work for her. She also is interested in injections, but I'm not familiar with the scope of injections for the treatment of migraine. - Continue Zomig, Topamax - Neurology referral

## 2011-06-14 NOTE — Assessment & Plan Note (Signed)
Provided refills for Percocet, and obtained a urine drug screen today. Should be reviewed at next visit. Patient was confident that Percocet would be in her urine, because she assures me that she is taking it. She also is taking Xanax when necessary and said it would likely be in her urine as well. She does have a prescription for Xanax.

## 2011-06-15 LAB — URINALYSIS, ROUTINE W REFLEX MICROSCOPIC
Nitrite: NEGATIVE
Specific Gravity, Urine: 1.008 (ref 1.005–1.030)
Urobilinogen, UA: 0.2 mg/dL (ref 0.0–1.0)
pH: 6.5 (ref 5.0–8.0)

## 2011-06-16 LAB — PRESCRIPTION ABUSE MONITORING 15P, URINE
Amphetamine/Meth: NEGATIVE NG/ML
Barbiturate Screen, Urine: NEGATIVE NG/ML
Benzodiazepine Screen, Urine: POSITIVE NG/ML — ABNORMAL HIGH
Buprenorphine, Urine: NEGATIVE NG/ML
Carisoprodol, Urine: POSITIVE NG/ML — ABNORMAL HIGH
Meperidine, Ur: NEGATIVE NG/ML
Propoxyphene: NEGATIVE NG/ML
Tramadol Scrn, Ur: NEGATIVE NG/ML
Zolpidem, Urine: NEGATIVE NG/ML

## 2011-06-16 LAB — OPIATES/OPIOIDS (LC/MS-MS)
Hydromorphone: NEGATIVE NG/ML
Morphine Urine: NEGATIVE NG/ML
Oxycodone, ur: 1880 NG/ML — ABNORMAL HIGH
Oxymorphone: 732 NG/ML — ABNORMAL HIGH

## 2011-06-16 LAB — BENZODIAZEPINES (GC/LC/MS), URINE
Diazepam (GC/LC/MS), ur confirm: NEGATIVE NG/ML
Estazolam (GC/LC/MS), ur confirm: NEGATIVE NG/ML
Nordiazepam (GC/LC/MS), ur confirm: NEGATIVE NG/ML
Oxazepam (GC/LC/MS), ur confirm: NEGATIVE NG/ML

## 2011-07-04 ENCOUNTER — Other Ambulatory Visit: Payer: Self-pay | Admitting: Infectious Disease

## 2011-07-04 ENCOUNTER — Other Ambulatory Visit: Payer: Medicaid Other

## 2011-07-04 DIAGNOSIS — F329 Major depressive disorder, single episode, unspecified: Secondary | ICD-10-CM

## 2011-07-04 DIAGNOSIS — B20 Human immunodeficiency virus [HIV] disease: Secondary | ICD-10-CM

## 2011-07-04 DIAGNOSIS — E785 Hyperlipidemia, unspecified: Secondary | ICD-10-CM

## 2011-07-04 DIAGNOSIS — G43909 Migraine, unspecified, not intractable, without status migrainosus: Secondary | ICD-10-CM

## 2011-07-04 DIAGNOSIS — E538 Deficiency of other specified B group vitamins: Secondary | ICD-10-CM

## 2011-07-04 DIAGNOSIS — R635 Abnormal weight gain: Secondary | ICD-10-CM

## 2011-07-04 LAB — T-HELPER CELL (CD4) - (RCID CLINIC ONLY): CD4 T Cell Abs: 560 uL (ref 400–2700)

## 2011-07-04 LAB — COMPLETE METABOLIC PANEL WITH GFR
AST: 18 U/L (ref 0–37)
BUN: 10 mg/dL (ref 6–23)
Calcium: 8.9 mg/dL (ref 8.4–10.5)
Chloride: 101 mEq/L (ref 96–112)
Creat: 0.7 mg/dL (ref 0.50–1.10)
Total Bilirubin: 0.3 mg/dL (ref 0.3–1.2)

## 2011-07-04 LAB — CBC WITH DIFFERENTIAL/PLATELET
Eosinophils Relative: 3 % (ref 0–5)
Hemoglobin: 13.4 g/dL (ref 12.0–15.0)
Lymphocytes Relative: 38 % (ref 12–46)
Lymphs Abs: 1.4 10*3/uL (ref 0.7–4.0)
MCV: 90.1 fL (ref 78.0–100.0)
Neutrophils Relative %: 53 % (ref 43–77)
Platelets: 161 10*3/uL (ref 150–400)
RBC: 4.56 MIL/uL (ref 3.87–5.11)
WBC: 3.8 10*3/uL — ABNORMAL LOW (ref 4.0–10.5)

## 2011-07-06 ENCOUNTER — Telehealth: Payer: Self-pay | Admitting: *Deleted

## 2011-07-06 NOTE — Telephone Encounter (Signed)
Called [phone] - denied Carisoprodol - pt needs to try at least 2 meds from list baclofen, cylobenzaprine, methocarbamol or tizanidine. Pt uses [facility]. Flag sent to Dr  Abner Greenspan. Stanton Kidney Sadae Arrazola RN 07/06/11 9:30AM

## 2011-07-07 ENCOUNTER — Other Ambulatory Visit: Payer: Self-pay | Admitting: Internal Medicine

## 2011-07-07 ENCOUNTER — Telehealth: Payer: Self-pay | Admitting: *Deleted

## 2011-07-07 LAB — HIV-1 RNA QUANT-NO REFLEX-BLD: HIV 1 RNA Quant: <20 copies/mL (ref <20)

## 2011-07-07 MED ORDER — ZOLMITRIPTAN 5 MG PO TABS: 5.0000 mg | ORAL_TABLET | ORAL | Status: DC | PRN

## 2011-07-07 NOTE — Telephone Encounter (Signed)
Dr Abner Greenspan called [phone] and has Carisoprodol approved for 1 year - pt and [facility] aware. Stanton Kidney Adalyne Lovick RN 07/07/11 10:30AM

## 2011-07-08 ENCOUNTER — Telehealth: Payer: Self-pay | Admitting: Oncology

## 2011-07-08 ENCOUNTER — Ambulatory Visit (HOSPITAL_BASED_OUTPATIENT_CLINIC_OR_DEPARTMENT_OTHER): Payer: Medicaid Other | Admitting: Nurse Practitioner

## 2011-07-08 VITALS — BP 118/76 | HR 90 | Temp 97.9°F | Ht 66.0 in | Wt 246.7 lb

## 2011-07-08 DIAGNOSIS — C8590 Non-Hodgkin lymphoma, unspecified, unspecified site: Secondary | ICD-10-CM

## 2011-07-08 DIAGNOSIS — C8589 Other specified types of non-Hodgkin lymphoma, extranodal and solid organ sites: Secondary | ICD-10-CM

## 2011-07-08 DIAGNOSIS — R339 Retention of urine, unspecified: Secondary | ICD-10-CM

## 2011-07-08 DIAGNOSIS — Z21 Asymptomatic human immunodeficiency virus [HIV] infection status: Secondary | ICD-10-CM

## 2011-07-08 NOTE — Telephone Encounter (Signed)
appts made and printed for 06/2012 for lab and cxr and 07/2012 for appt with dr g   aom

## 2011-07-08 NOTE — Progress Notes (Signed)
OFFICE PROGRESS NOTE  Interval history:  Ms. Collison is a 46 year old woman, HIV positive, diagnosed with limited-stage high-grade B-cell non-Hodgkin's lymphoma in January 2005 when she presented with left axillary lymphadenopathy.  She was treated with 4 cycles of CHOP/Rituxan then involved field radiation. She achieved a complete response. She is seen today for scheduled follow-up.  Overall Ms. Gandolfi is doing well. She reports her HIV medications have been adjusted. She notes improved tolerance of the new medication. She is intermittently fatigued. She denies fevers, night sweats. She has a good appetite. She has been trying to lose weight. She had lost down to about 215 pounds. She is disappointed that she has regained the weight. She attributes some of the weight gain to family problems and depression. She continues to have migraine headaches. She has chronic constipation. She has chronic stable back pain. She reports "bladder problems" with evaluation scheduled in the near future. She recently began a trial of Flomax.   Objective: Blood pressure 118/76, pulse 90, temperature 97.9 F (36.6 C), temperature source Oral, height 5\' 6"  (1.676 m), weight 246 lb 11.2 oz (111.902 kg).  Oropharynx is without thrush or ulceration. No palpable cervical, supraclavicular, axillary or inguinal lymph nodes. Lungs are clear. No wheezes or rales. Regular cardiac rhythm. Abdomen is soft and nontender. Obese. No obvious mass or organomegaly. Extremities are without edema. Calves are soft and nontender.   Lab Results: Lab Results  Component Value Date   WBC 3.8* 07/04/2011   HGB 13.4 07/04/2011   HCT 41.1 07/04/2011   MCV 90.1 07/04/2011   PLT 161 07/04/2011    Chemistry:    Chemistry      Component Value Date/Time   NA 140 07/04/2011 1015   K 4.2 07/04/2011 1015   CL 101 07/04/2011 1015   CO2 30 07/04/2011 1015   BUN 10 07/04/2011 1015   CREATININE 0.70 07/04/2011 1015   CREATININE 0.74 06/10/2011  0925   CREATININE 0.74 06/10/2011 0925      Component Value Date/Time   CALCIUM 8.9 07/04/2011 1015   ALKPHOS 66 07/04/2011 1015   AST 18 07/04/2011 1015   ALT 14 07/04/2011 1015   BILITOT 0.3 07/04/2011 1015       Studies/Results: Dg Chest 2 View  06/10/2011  *RADIOLOGY REPORT*  Clinical Data: Lymphoma  CHEST - 2 VIEW  Comparison: 12/20/2010  Findings: Cardiomediastinal silhouette is stable.  Mild elevation of the right hemidiaphragm again noted.  No acute infiltrate or pleural effusion.  No pulmonary edema. Stable mild degenerative changes thoracic spine.  IMPRESSION: No active disease.  No significant change.  Original Report Authenticated By: Natasha Mead, M.D.    Medications: I have reviewed the patient's current medications.  Assessment/Plan:  1. Stage I-A high-grade B-cell non-Hodgkin's lymphoma diagnosed January 2005 at which time she presented with left axillary lymphadenopathy. She was treated with 4 cycles of CHOP/Rituxan then involved field radiation achieving a complete response. She remains in remission.     2. HIV/AIDS.  She is followed by Dr. Algis Liming.   3. History of recurrent herpes zoster infections. 4. History of a left pelvic DVT.    Disposition-Ms. Brier appears stable. She remains in remission from the non-Hodgkin's lymphoma. She will return for a followup visit in one year. We will obtain labs and a chest x-ray one week prior to the office visit. She will contact the office prior to her next visit with any problems.  Plan reviewed with Dr. Cyndie Chime.    Lonna Cobb ANP/GNP-BC

## 2011-07-12 ENCOUNTER — Ambulatory Visit: Payer: Medicaid Other | Attending: Urology | Admitting: Physical Therapy

## 2011-07-12 DIAGNOSIS — IMO0001 Reserved for inherently not codable concepts without codable children: Secondary | ICD-10-CM | POA: Insufficient documentation

## 2011-07-12 DIAGNOSIS — M545 Low back pain, unspecified: Secondary | ICD-10-CM | POA: Insufficient documentation

## 2011-07-14 ENCOUNTER — Telehealth: Payer: Self-pay | Admitting: *Deleted

## 2011-07-14 NOTE — Telephone Encounter (Signed)
Called [phone] approved Zomig 07/14/11 to 07/13/12. Pt and [facility] aware. Stanton Kidney Nicola Quesnell RN 07/14/11 10:30AM

## 2011-07-18 ENCOUNTER — Encounter: Payer: Self-pay | Admitting: Infectious Disease

## 2011-07-18 ENCOUNTER — Ambulatory Visit (INDEPENDENT_AMBULATORY_CARE_PROVIDER_SITE_OTHER): Payer: Medicaid Other | Admitting: Infectious Disease

## 2011-07-18 VITALS — BP 118/74 | HR 77 | Temp 97.6°F | Ht 66.0 in | Wt 242.0 lb

## 2011-07-18 DIAGNOSIS — B20 Human immunodeficiency virus [HIV] disease: Secondary | ICD-10-CM

## 2011-07-18 DIAGNOSIS — R339 Retention of urine, unspecified: Secondary | ICD-10-CM

## 2011-07-18 DIAGNOSIS — G43909 Migraine, unspecified, not intractable, without status migrainosus: Secondary | ICD-10-CM

## 2011-07-18 DIAGNOSIS — J029 Acute pharyngitis, unspecified: Secondary | ICD-10-CM | POA: Insufficient documentation

## 2011-07-18 LAB — RAPID STREP SCREEN (MED CTR MEBANE ONLY): Streptococcus, Group A Screen (Direct): NEGATIVE

## 2011-07-18 MED ORDER — DARUNAVIR ETHANOLATE 800 MG PO TABS: 800.0000 mg | ORAL_TABLET | Freq: Every day | ORAL | Status: DC

## 2011-07-18 NOTE — Assessment & Plan Note (Signed)
Following with Urology. 

## 2011-07-18 NOTE — Assessment & Plan Note (Signed)
Continue prezista 800mg  boosted with norvir and truvada

## 2011-07-18 NOTE — Progress Notes (Signed)
  Subjective:    Patient ID: [name], female    DOB: 10-05-1965, 46 y.o.   MRN: [id number]  HPI  [name] is a 46 y.o. 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 Recently she has had several problems with urinary symptoms and is apparently with hihg post void residuals being worked up by [facility]. Her headaches are being managed by Neurology and they are apparently considering botox injections of muscles in head and neck per the patient. She had some depression with the holidays and with relatives visiting. SHe had URI and then sore throate afterwards and wonders if she might have strep throat. She claims she has had interimittnet fevers. Otherwise is doing fairly well.    Review of Systems  Constitutional: Negative for fever, chills, diaphoresis, activity change, appetite change, fatigue and unexpected weight change.  HENT: Negative for congestion, sore throat, rhinorrhea, sneezing, trouble swallowing and sinus pressure.   Eyes: Negative for photophobia and visual disturbance.  Respiratory: Negative for cough, chest tightness, shortness of breath, wheezing and stridor.   Cardiovascular: Negative for chest pain, palpitations and leg swelling.  Gastrointestinal: Negative for nausea, vomiting, abdominal pain, diarrhea, constipation, blood in stool, abdominal distention and anal bleeding.  Genitourinary: Positive for dysuria and difficulty urinating. Negative for hematuria and flank pain.  Musculoskeletal: Negative for myalgias, back pain, joint swelling, arthralgias and gait problem.  Skin: Negative for color change, pallor, rash and wound.  Neurological: Positive for headaches. Negative for dizziness, tremors, weakness and light-headedness.  Hematological: Negative for adenopathy.  Does not bruise/bleed easily.  Psychiatric/Behavioral: Positive for dysphoric mood. Negative for suicidal ideas, behavioral problems, confusion, sleep disturbance, decreased concentration and agitation.       Objective:   Physical Exam  Constitutional: She is oriented to person, place, and time. She appears well-developed and well-nourished. No distress.  HENT:  Head: Normocephalic and atraumatic.  Mouth/Throat: Oropharynx is clear and moist. No oropharyngeal exudate.    Eyes: Conjunctivae and EOM are normal. Pupils are equal, round, and reactive to light. No scleral icterus.  Neck: Normal range of motion. Neck supple. No JVD present.  Cardiovascular: Normal rate, regular rhythm and normal heart sounds.  Exam reveals no gallop and no friction rub.   No murmur heard. Pulmonary/Chest: Effort normal and breath sounds normal. No respiratory distress. She has no wheezes. She has no rales. She exhibits no tenderness.  Abdominal: She exhibits no distension and no mass. There is no tenderness. There is no rebound and no guarding.  Musculoskeletal: She exhibits no edema and no tenderness.  Lymphadenopathy:    She has no cervical adenopathy.  Neurological: She is alert and oriented to person, place, and time. She has normal reflexes. She exhibits normal muscle tone. Coordination normal.  Skin: Skin is warm and dry. She is not diaphoretic. No erythema. No pallor.  Psychiatric: Her behavior is normal. Judgment and thought content normal. She exhibits a depressed mood.          Assessment & Plan:  HIV INFECTION Continue prezista 800mg  boosted with norvir and truvada  Sore throat Doubt GAS but will do rapid strep and culture  Urinary retention Following with Urology  MIGRAINE HEADACHE Followed by Neurology. Has percocet for breakthrough not relieved by other migraine meds

## 2011-07-18 NOTE — Assessment & Plan Note (Signed)
Doubt GAS but will do rapid strep and culture

## 2011-07-18 NOTE — Progress Notes (Signed)
  Subjective:    Patient ID: [name], female    DOB: 03/07/1966, 46 y.o.   MRN: [id number]  HPI  I spent greater than 45 minutes with the patient including greater than 50% of time in face to face counsel of the patient and in coordination of their care.   Review of Systems     Objective:   Physical Exam        Assessment & Plan:

## 2011-07-18 NOTE — Assessment & Plan Note (Signed)
Followed by Neurology. Has percocet for breakthrough not relieved by other migraine meds

## 2011-07-19 ENCOUNTER — Telehealth: Payer: Self-pay | Admitting: *Deleted

## 2011-07-19 NOTE — Telephone Encounter (Signed)
[id number] - Nexium approved till 07/18/12. [facility] aware. Stanton Kidney Xan Sparkman RN 07/19/11 2:30PM

## 2011-07-20 ENCOUNTER — Ambulatory Visit: Payer: Medicaid Other | Admitting: Physical Therapy

## 2011-08-01 ENCOUNTER — Ambulatory Visit (INDEPENDENT_AMBULATORY_CARE_PROVIDER_SITE_OTHER): Payer: Medicaid Other | Admitting: Internal Medicine

## 2011-08-01 ENCOUNTER — Encounter: Payer: Self-pay | Admitting: Internal Medicine

## 2011-08-01 DIAGNOSIS — R21 Rash and other nonspecific skin eruption: Secondary | ICD-10-CM

## 2011-08-01 DIAGNOSIS — G43909 Migraine, unspecified, not intractable, without status migrainosus: Secondary | ICD-10-CM

## 2011-08-01 DIAGNOSIS — G894 Chronic pain syndrome: Secondary | ICD-10-CM

## 2011-08-01 DIAGNOSIS — B20 Human immunodeficiency virus [HIV] disease: Secondary | ICD-10-CM

## 2011-08-01 DIAGNOSIS — K219 Gastro-esophageal reflux disease without esophagitis: Secondary | ICD-10-CM

## 2011-08-01 DIAGNOSIS — R339 Retention of urine, unspecified: Secondary | ICD-10-CM

## 2011-08-01 DIAGNOSIS — D539 Nutritional anemia, unspecified: Secondary | ICD-10-CM

## 2011-08-01 DIAGNOSIS — C8590 Non-Hodgkin lymphoma, unspecified, unspecified site: Secondary | ICD-10-CM

## 2011-08-01 DIAGNOSIS — F329 Major depressive disorder, single episode, unspecified: Secondary | ICD-10-CM

## 2011-08-01 DIAGNOSIS — F3289 Other specified depressive episodes: Secondary | ICD-10-CM

## 2011-08-01 DIAGNOSIS — F419 Anxiety disorder, unspecified: Secondary | ICD-10-CM

## 2011-08-01 DIAGNOSIS — D649 Anemia, unspecified: Secondary | ICD-10-CM

## 2011-08-01 DIAGNOSIS — E538 Deficiency of other specified B group vitamins: Secondary | ICD-10-CM

## 2011-08-01 MED ORDER — CYANOCOBALAMIN 1000 MCG/ML IJ SOLN: 1000.0000 ug | Freq: Once | INTRAMUSCULAR | Status: DC

## 2011-08-01 MED ORDER — ZOLMITRIPTAN 5 MG PO TABS: 5.0000 mg | ORAL_TABLET | ORAL | Status: DC | PRN

## 2011-08-01 MED ORDER — ESOMEPRAZOLE MAGNESIUM 40 MG PO CPDR: 40.0000 mg | DELAYED_RELEASE_CAPSULE | Freq: Two times a day (BID) | ORAL | Status: DC

## 2011-08-01 MED ORDER — ALPRAZOLAM 2 MG PO TABS: ORAL_TABLET | ORAL | Status: DC

## 2011-08-01 MED ORDER — SULFAMETHOXAZOLE-TRIMETHOPRIM 400-80 MG PO TABS: 1.0000 | ORAL_TABLET | Freq: Two times a day (BID) | ORAL | Status: AC

## 2011-08-01 NOTE — Assessment & Plan Note (Addendum)
As indicated physical exam, patient has 2 lesions in the gluteal cleft and a few scattered regions in the perineum. The largest of the lesions is about a penny size in the gluteal cleft, and looks like a ruptured and denuded vesicle. 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 She does have MRSA rash listed in her problem list. She says she does not think this could be HSV. The patient is insistent on antibiotics, and rather than argue I will give her some Bactrim. It is in fact bacterial, this should clear it up. It is viral, it may still clear, giving the patient both sensitive to Bactrim that solved this problem. It is not clear, patient to return to clinic after her prescription of 7 days is done. - Bactrim x7 days - Consider HSV if no clearing - Emphasized to patient that this may have been a viral rather than bacterial infection

## 2011-08-01 NOTE — Assessment & Plan Note (Signed)
Still poorly controlled, on Zomig, with neurology appointment on 2/14.

## 2011-08-01 NOTE — Progress Notes (Signed)
Subjective:     Patient ID: [name], female   DOB: 1965/12/05, 46 y.o.   MRN: [id number]  HPI Patient is a 46 old female with extensive past medical history including HIV, history of non-Hodgkin's lymphoma, depression, chronic migraines, recent urinary retention, and chronic lower back pain who comes for followup of multiple medical issues.   #Lower back pain: The patient continues to take 4 Percocet per day, and feels that her lower back pain is stable. She has gained some weight recently and notes that it is harder to exercise. She is working to get back into the gym. Her last UDS was appropriate, with both opiates and Xanax observed.  #Urinary retention: Patient had been referred to urology, and is currently getting worked up. She has had a few diagnostic tests, but she is still in the process of getting evaluated and is seeing urology again in 2 days.   #Migraine headaches: Patient continues to have poorly controlled migraines, on Zomig right now. Has an appointment with neurology on 08/18/11.    #Depression: Patient continues to take 90 mg of Cymbalta as well as when necessary Xanax. She uses the Xanax to help her sleep. Her mood is stable, she denies SI.   #NHL: Patient recently saw her oncologist Dr. Oletta Lamas, who after surveillance CT scan in labs said she could followup in 1 year.  #Perineal ulcer: Patient says she has a mild rash in her gluteal cleft and perineum that she has experienced in the past. She says that in the past she's gotten Bactrim and this helped clear it up.  Review of Systems As per history of present illness    Objective:   Physical Exam GEN: NAD.  Alert and oriented x 3.  Pleasant, conversant, and cooperative to exam. ABDOMEN: obese, NT/ND, NABS GU: penny sized ulcer in gluteal cleft as well as smaller,~3-4cm ulcer also in gluteal cleft.  Larger of the two is pink with granulation tissue, mild surrounding erythema with hyperpigmenation surrounding that.   The smaller of the two is still covered, looks like a vesicle, without surrounding erythema.  In the L perineum, there are 3 scattered, ~3-59mm papules and vesicles with mild erythema.     Assessment:        Plan:

## 2011-08-01 NOTE — Assessment & Plan Note (Signed)
Following with urology, still in the midst of workup. I received their initial documentation, listed in the media section, but have yet to see their followup plans. It does seem that she has some degree of urinary retention, etiology remains unclear.

## 2011-08-01 NOTE — Assessment & Plan Note (Signed)
Managed by Dr. Daiva Eves.  Doing well.

## 2011-08-01 NOTE — Assessment & Plan Note (Signed)
UDS from a few months ago was appropriate. She has yet to fill her February prescription, and I gave her prescriptions for March and April for her Percocet. I refilled her Xanax as well.

## 2011-08-01 NOTE — Assessment & Plan Note (Signed)
Patient got a B12 shot today in clinic. Inquired about giving herself B12 injections at home.

## 2011-08-01 NOTE — Assessment & Plan Note (Signed)
Currently on Cymbalta 90 mg as well as Xanax. She is comfortable with this regimen and seems in decent spirits. She denies SI. I'm okay with her continuing this regimen for now.

## 2011-08-09 ENCOUNTER — Other Ambulatory Visit: Payer: Self-pay | Admitting: Internal Medicine

## 2011-08-10 ENCOUNTER — Encounter: Payer: Medicaid Other | Admitting: Physical Therapy

## 2011-08-12 ENCOUNTER — Other Ambulatory Visit: Payer: Self-pay | Admitting: *Deleted

## 2011-08-12 NOTE — Telephone Encounter (Signed)
rec'd a fax from pharmacy. Medicaid does not want to pay for crestor. The md notes are clear that she must have thi sone to avoid complications. medicaid form done & to md for signature

## 2011-08-15 ENCOUNTER — Other Ambulatory Visit: Payer: Self-pay | Admitting: *Deleted

## 2011-08-15 DIAGNOSIS — E785 Hyperlipidemia, unspecified: Secondary | ICD-10-CM

## 2011-08-15 DIAGNOSIS — F329 Major depressive disorder, single episode, unspecified: Secondary | ICD-10-CM

## 2011-08-15 DIAGNOSIS — B20 Human immunodeficiency virus [HIV] disease: Secondary | ICD-10-CM

## 2011-08-15 DIAGNOSIS — F3289 Other specified depressive episodes: Secondary | ICD-10-CM

## 2011-08-15 DIAGNOSIS — E538 Deficiency of other specified B group vitamins: Secondary | ICD-10-CM

## 2011-08-15 DIAGNOSIS — R635 Abnormal weight gain: Secondary | ICD-10-CM

## 2011-08-15 DIAGNOSIS — G43909 Migraine, unspecified, not intractable, without status migrainosus: Secondary | ICD-10-CM

## 2011-08-15 MED ORDER — ROSUVASTATIN CALCIUM 20 MG PO TABS: 20.0000 mg | ORAL_TABLET | Freq: Every day | ORAL | Status: DC

## 2011-08-15 NOTE — Telephone Encounter (Signed)
Form filled out thanks [employer]

## 2011-08-15 NOTE — Telephone Encounter (Signed)
rec'd prior auth from mediciad for crestor. Sent refill vis e rx

## 2011-08-15 NOTE — Telephone Encounter (Signed)
Prior auth faxed to medicaid

## 2011-08-16 ENCOUNTER — Ambulatory Visit: Payer: Medicaid Other | Attending: Urology | Admitting: Physical Therapy

## 2011-08-16 DIAGNOSIS — IMO0001 Reserved for inherently not codable concepts without codable children: Secondary | ICD-10-CM | POA: Insufficient documentation

## 2011-08-16 DIAGNOSIS — M545 Low back pain, unspecified: Secondary | ICD-10-CM | POA: Insufficient documentation

## 2011-08-18 ENCOUNTER — Other Ambulatory Visit: Payer: Self-pay | Admitting: *Deleted

## 2011-08-18 MED ORDER — CARISOPRODOL 250 MG PO TABS: 250.0000 mg | ORAL_TABLET | Freq: Three times a day (TID) | ORAL | Status: DC | PRN

## 2011-08-18 NOTE — Telephone Encounter (Signed)
[facility] rx called to [facility] pharmacy.

## 2011-08-31 ENCOUNTER — Encounter: Payer: Medicaid Other | Admitting: Physical Therapy

## 2011-09-06 ENCOUNTER — Other Ambulatory Visit: Payer: Self-pay | Admitting: *Deleted

## 2011-09-06 MED ORDER — TOPIRAMATE 100 MG PO TABS: 100.0000 mg | ORAL_TABLET | Freq: Two times a day (BID) | ORAL | Status: DC

## 2011-09-06 MED ORDER — ZOLMITRIPTAN 5 MG PO TABS: 5.0000 mg | ORAL_TABLET | ORAL | Status: DC | PRN

## 2011-09-07 ENCOUNTER — Other Ambulatory Visit: Payer: Self-pay | Admitting: *Deleted

## 2011-09-07 NOTE — Telephone Encounter (Signed)
Sherry Burton-  Did [name] say what the fluconazole was for?  I'm not sure what it was originally prescribed back in 01/2011.  Thanks [employer]

## 2011-09-09 MED ORDER — FLUCONAZOLE 100 MG PO TABS: 150.0000 mg | ORAL_TABLET | Freq: Every day | ORAL | Status: DC

## 2011-09-09 NOTE — Telephone Encounter (Signed)
Vaginal yeast

## 2011-09-13 ENCOUNTER — Other Ambulatory Visit: Payer: Self-pay | Admitting: Neurology

## 2011-09-13 DIAGNOSIS — G43909 Migraine, unspecified, not intractable, without status migrainosus: Secondary | ICD-10-CM

## 2011-09-13 DIAGNOSIS — F329 Major depressive disorder, single episode, unspecified: Secondary | ICD-10-CM

## 2011-09-13 DIAGNOSIS — E785 Hyperlipidemia, unspecified: Secondary | ICD-10-CM

## 2011-09-13 DIAGNOSIS — C8590 Non-Hodgkin lymphoma, unspecified, unspecified site: Secondary | ICD-10-CM

## 2011-09-16 ENCOUNTER — Inpatient Hospital Stay: Admission: RE | Admit: 2011-09-16 | Payer: Medicaid Other | Source: Ambulatory Visit

## 2011-09-20 ENCOUNTER — Ambulatory Visit
Admission: RE | Admit: 2011-09-20 | Discharge: 2011-09-20 | Disposition: A | Discharge status: Home / Self Care | Payer: Medicaid Other | Source: Ambulatory Visit | Attending: Neurology | Admitting: Neurology

## 2011-09-20 DIAGNOSIS — E785 Hyperlipidemia, unspecified: Secondary | ICD-10-CM

## 2011-09-20 DIAGNOSIS — C8590 Non-Hodgkin lymphoma, unspecified, unspecified site: Secondary | ICD-10-CM

## 2011-09-20 DIAGNOSIS — G43909 Migraine, unspecified, not intractable, without status migrainosus: Secondary | ICD-10-CM

## 2011-09-20 DIAGNOSIS — F329 Major depressive disorder, single episode, unspecified: Secondary | ICD-10-CM

## 2011-09-20 MED ORDER — GADOBENATE DIMEGLUMINE 529 MG/ML IV SOLN
19.0000 mL | Freq: Once | INTRAVENOUS | Status: AC | PRN
  Administered 2011-09-20: 19 mL via INTRAVENOUS

## 2011-09-21 ENCOUNTER — Encounter: Payer: Medicaid Other | Admitting: Physical Therapy

## 2011-09-21 ENCOUNTER — Telehealth: Payer: Self-pay | Admitting: *Deleted

## 2011-09-21 NOTE — Telephone Encounter (Signed)
Pt is due to see Dr. Abner Greenspan on 10/25/2011. Pt had questions about who Dr. Izola Price was on her records. Pt informed that Dr. Izola Price' former name was Dr. Aldine Contes, who was her former PCP.  She also wanted to remind the [facility] that Dr. Abner Greenspan had told her she could pick up her April Percocet prescription early if her appt was past the refill date. In other words, her appt with Dr. Abner Greenspan is 4/23. Her Percocet (per pt) is due to be refilled 4/15. Pt is coming from [facility] for another dr.'s appt on 4/8 and would like to pick up the prescription on 4/8 and knows the Rx will be dated not to be filled until 4/15. She is not requesting an early refill but does want to physically pick the Rx up early when she is in town. She will ask for the remaining 2 refills at the appt time. She states she had discussed all this request with Dr. Abner Greenspan at her last visit and he should be expecting the request.  I asked pt to call the triage nurse the first week in April to request the Rx to be picked up on 4/8, dated to be filled 4/15, allowing enough time for triage to contact her dr and get a physical Rx ready to be picked up on 4/8.

## 2011-09-28 ENCOUNTER — Encounter: Payer: Medicaid Other | Admitting: Physical Therapy

## 2011-09-29 ENCOUNTER — Ambulatory Visit: Payer: Medicaid Other | Attending: Urology | Admitting: Physical Therapy

## 2011-09-29 DIAGNOSIS — IMO0001 Reserved for inherently not codable concepts without codable children: Secondary | ICD-10-CM | POA: Insufficient documentation

## 2011-09-29 DIAGNOSIS — M545 Low back pain, unspecified: Secondary | ICD-10-CM | POA: Insufficient documentation

## 2011-10-06 ENCOUNTER — Other Ambulatory Visit: Payer: Self-pay | Admitting: Infectious Disease

## 2011-10-06 ENCOUNTER — Other Ambulatory Visit: Payer: Self-pay | Admitting: *Deleted

## 2011-10-06 ENCOUNTER — Other Ambulatory Visit: Payer: Self-pay | Admitting: Internal Medicine

## 2011-10-06 DIAGNOSIS — B20 Human immunodeficiency virus [HIV] disease: Secondary | ICD-10-CM

## 2011-10-07 ENCOUNTER — Other Ambulatory Visit: Payer: Self-pay | Admitting: *Deleted

## 2011-10-07 MED ORDER — FLUCONAZOLE 100 MG PO TABS: 150.0000 mg | ORAL_TABLET | Freq: Every day | ORAL | Status: DC

## 2011-10-07 NOTE — Telephone Encounter (Signed)
Note states pt to pick up written Rx 10/17/11 - you can sign Rx  10/11/11 during clinic.

## 2011-10-12 ENCOUNTER — Encounter: Payer: Medicaid Other | Admitting: Physical Therapy

## 2011-10-17 ENCOUNTER — Telehealth: Payer: Self-pay | Admitting: *Deleted

## 2011-10-17 NOTE — Telephone Encounter (Signed)
Talked with pt 9AM 10/17/11 - wants refill Percocet. Dr Abner Greenspan down in clinic -  pt has both Rx for Percocet 09/16/11 and 10/17/11. Copies in Rx chart - pt  And Dr Abner Greenspan signed. Stanton Kidney Jullisa Grigoryan RN 10/17/11 9:04AM

## 2011-10-18 ENCOUNTER — Ambulatory Visit: Payer: Medicaid Other | Admitting: Sports Medicine

## 2011-10-25 ENCOUNTER — Encounter: Payer: Self-pay | Admitting: Internal Medicine

## 2011-10-25 ENCOUNTER — Ambulatory Visit (INDEPENDENT_AMBULATORY_CARE_PROVIDER_SITE_OTHER): Payer: Medicaid Other | Admitting: Internal Medicine

## 2011-10-25 VITALS — BP 111/74 | HR 98 | Temp 97.0°F | Resp 20 | Ht 65.0 in | Wt 246.4 lb

## 2011-10-25 DIAGNOSIS — F3289 Other specified depressive episodes: Secondary | ICD-10-CM

## 2011-10-25 DIAGNOSIS — E538 Deficiency of other specified B group vitamins: Secondary | ICD-10-CM

## 2011-10-25 DIAGNOSIS — N898 Other specified noninflammatory disorders of vagina: Secondary | ICD-10-CM

## 2011-10-25 DIAGNOSIS — G894 Chronic pain syndrome: Secondary | ICD-10-CM

## 2011-10-25 DIAGNOSIS — G43909 Migraine, unspecified, not intractable, without status migrainosus: Secondary | ICD-10-CM

## 2011-10-25 DIAGNOSIS — B20 Human immunodeficiency virus [HIV] disease: Secondary | ICD-10-CM

## 2011-10-25 DIAGNOSIS — D539 Nutritional anemia, unspecified: Secondary | ICD-10-CM

## 2011-10-25 DIAGNOSIS — F329 Major depressive disorder, single episode, unspecified: Secondary | ICD-10-CM

## 2011-10-25 DIAGNOSIS — N939 Abnormal uterine and vaginal bleeding, unspecified: Secondary | ICD-10-CM

## 2011-10-25 MED ORDER — FLUCONAZOLE 100 MG PO TABS: 100.0000 mg | ORAL_TABLET | Freq: Every day | ORAL | Status: DC

## 2011-10-25 MED ORDER — OXYCODONE-ACETAMINOPHEN 5-325 MG PO TABS: 1.0000 | ORAL_TABLET | Freq: Four times a day (QID) | ORAL | Status: DC | PRN

## 2011-10-25 MED ORDER — CYANOCOBALAMIN 1000 MCG/ML IJ SOLN: 1000.0000 ug | Freq: Once | INTRAMUSCULAR | Status: DC

## 2011-10-25 NOTE — Assessment & Plan Note (Signed)
Pt is status endometrial ablation, had bright red blood last week.  Does have OB, who she will see to evaluate.  Declined physical examination today. - schedule appt with OB for evaluation

## 2011-10-25 NOTE — Progress Notes (Signed)
Subjective:     Patient ID: MACKLYN GLANDON, female   DOB: 1966/01/09, 46 y.o.   MRN: [id number]  HPI Patient is a 46 old female with extensive past medical history including HIV, history of non-Hodgkin's lymphoma, depression, chronic migraines, recent urinary retention, and chronic lower back pain who comes for followup of multiple medical issues.  #Feels poorly: today c/o HA beyond usual, sore throat, rhinorrhea, drainage, sore throat x 2 weeks, thinks its allergies.  Feeling even worse since Sunday.  Diarrhea x 3 days.   #Lower back pain: Con't back pain, up and down on percocet (c pain contract)  R knee pain also happening in relation, gave way twice, feels like R ankle and R knee are related to back.  #Urinary retention: scheduled f/u, still with some retention and feelings of fullness, flomax helping.  Trying to drink less liquid.  #Migraine headaches: saw neuro, had MRI that was negative.  Going to con't with zomig and topamax, in process of scheduling shots at present time.  #Depression: has had family stressors recently (grandmother, brother-in-law) with health issues.  Denies SI.  #HIV: has appt with Dr. VD in may.  #Perineal ulcer: resolved  # Vaginal bleeding: pt is s/p endometrial ablation, had bright red blood recently.  Will make appointment.   Review of Systems     Objective:   Physical Exam BP 111/74  Pulse 98  Temp(Src) 97 F (36.1 C) (Oral)  Resp 20  Ht 5\' 5"  (1.651 m)  Wt 246 lb 6.4 oz (111.766 kg)  BMI 41.00 kg/m2 GEN: No apparent distress.  Alert and oriented x 3.  Pleasant, conversant, and cooperative to exam. HEENT: NCAT.  Neck is supple without palpable masses or lymphadenopathy.  EOMI.  PERRLA.  Sclerae anicteric.  Conjunctivae noninjected. MMM.  Oropharynx is without erythema, exudates, or other abnormal lesions. RESP:  Lungs are clear to ascultation bilaterally with good air movement.  No wheezes, ronchi, or rubs. CARDIOVASCULAR: regular rate, normal  rhythm.  Clear S1, S2, no murmurs, gallops, or rubs. ABDOMEN: soft, non-tender, non-distended.  Bowels sounds present in all quadrants and normoactive.  No palpable masses. EXT: warm and dry.  Peripheral pulses equal, intact, and +2 globally.  No clubbing or cyanosis.  No edema in b/l lower extremeties SKIN: warm and dry with normal turgor.  No rashes or abnormal lesions observed.     Assessment:         Plan:

## 2011-10-25 NOTE — Assessment & Plan Note (Signed)
Stable, has f/u with Dr. VD scheduled.  Pt says she occasionally gets thrush 2/2 HIV meds.  Has taken fluconazole in the past.  No thrush on exam today.  Says she wants to have Rx to use PRN.  I did refill it, but I want to make sure Dr. VD knows that she does take this medicine when she feels it necessary.  She has not had low CD4 counts recently, so I have no reason to suspect any thrush would be 2/2 immunocompromise.  I will leave this issue up to Dr. VD, but I did prescribe the fluconazole.

## 2011-10-25 NOTE — Assessment & Plan Note (Signed)
Given rx for 5/15, 6/15, and 7/15, as pt likes to give them to pharmacy to keep.  Did not do UDS this visit.  Back pain persistent but managable. - con't percocet - UDS next visit - prescriptions copied and in pain contract folder in clinic

## 2011-10-25 NOTE — Assessment & Plan Note (Signed)
Recent family stressors include medical probs on brother-in-law and grandmother, but doing as well as she can.  Denies SI.  Stable on cymbalta and xanax.

## 2011-10-25 NOTE — Assessment & Plan Note (Signed)
Saw neurology, MRI was normal.  Scheduled to try injections soon.  Con't on zomig and topamax per neuro.

## 2011-10-25 NOTE — Assessment & Plan Note (Signed)
Got B12 shot today in clinic.

## 2011-11-01 ENCOUNTER — Other Ambulatory Visit: Payer: Medicaid Other

## 2011-11-02 ENCOUNTER — Ambulatory Visit (INDEPENDENT_AMBULATORY_CARE_PROVIDER_SITE_OTHER): Payer: Medicaid Other | Admitting: Sports Medicine

## 2011-11-02 ENCOUNTER — Ambulatory Visit
Admission: RE | Admit: 2011-11-02 | Discharge: 2011-11-02 | Disposition: A | Discharge status: Home / Self Care | Payer: Medicaid Other | Source: Ambulatory Visit | Attending: Sports Medicine | Admitting: Sports Medicine

## 2011-11-02 VITALS — BP 109/79

## 2011-11-02 DIAGNOSIS — M4310 Spondylolisthesis, site unspecified: Secondary | ICD-10-CM

## 2011-11-02 DIAGNOSIS — M222X1 Patellofemoral disorders, right knee: Secondary | ICD-10-CM | POA: Insufficient documentation

## 2011-11-02 DIAGNOSIS — M25569 Pain in unspecified knee: Secondary | ICD-10-CM

## 2011-11-02 DIAGNOSIS — Q762 Congenital spondylolisthesis: Secondary | ICD-10-CM

## 2011-11-02 NOTE — Assessment & Plan Note (Signed)
Repeat XR to reassess slip. Her pain is axial with radiculopathy. Certainly if increasing slip with cont'd radicular symptoms would have her see spine surgery to discuss fusion.

## 2011-11-02 NOTE — Progress Notes (Signed)
  Subjective:    Patient ID: [name], female    DOB: [birth year], 46 y.o.   MRN: [id number]  HPI Back pain: Chronic, currently on Percocet daily. She localizes the pain to the lower midback, as well as radiating down the posterior and lateral aspect of both legs. She has known grade 1 spondylolisthesis based on an MRI scan in 2008, as well as x-rays in 2009. She has not had imaging repeated since then.she also has been having difficulty urinating, as well as constipation.  He is gone through formal physical therapy, but has not had any transforaminal injection therapy.  Bilateral knee pain: Localized under the kneecaps, she gets a grinding sensation. Notes it's worse when going up stairs. Has never had any formal physical therapy for this.   Review of Systems    No fevers, chills, night sweats, weight loss, chest pain, or shortness of breath.  Social History: Non-smoker. Objective:   Physical Exam General:  Well developed, obese, and in no acute distress. Neuro:  Alert and oriented x3, extra-ocular muscles intact. Skin: Warm and dry, no rashes noted. Respiratory:  Not using accessory muscles, speaking in full sentences. Musculoskeletal: Bilateral Knee: Normal to inspection with no erythema or effusion or obvious bony abnormalities. Palpation normal with no warmth, joint line tenderness, patellar tenderness, or condyle tenderness. ROM full in flexion and extension and lower leg rotation. Ligaments with solid consistent endpoints including ACL, PCL, LCL, MCL. Negative Mcmurray's, Apley's, and Thessalonian tests. Painful patellar compression, with significant amount of crepitus. Patellar and quadriceps tendons unremarkable. Hamstring and quadriceps strength is normal.  Poor vastus medialis obliquus definition.      Assessment & Plan:

## 2011-11-02 NOTE — Assessment & Plan Note (Addendum)
Formal PT for VMO strength. Compression for R knee. RTC 4 weeks to reassess.

## 2011-11-02 NOTE — Patient Instructions (Signed)
XR lumbar spine. PT for your knees. Knee sleeve. Come back to see [place] in 4 weeks.

## 2011-11-03 ENCOUNTER — Other Ambulatory Visit: Payer: Self-pay | Admitting: *Deleted

## 2011-11-03 MED ORDER — DULOXETINE HCL 30 MG PO CPEP: 90.0000 mg | ORAL_CAPSULE | Freq: Every day | ORAL | Status: DC

## 2011-11-10 ENCOUNTER — Telehealth: Payer: Self-pay | Admitting: *Deleted

## 2011-11-10 NOTE — Telephone Encounter (Signed)
Pt called and left message that clinic did not write Zomig Rx correctly - she only got # 12. Talked with [facility] and insurance will only allow only 12 Zomig per month. Left message on pt home ID recording about why she only got 12 Zomig due to insurance - only 12 per month. Stanton Kidney Zoria Rawlinson RN 11/10/11 2PM

## 2011-11-15 ENCOUNTER — Telehealth: Payer: Self-pay | Admitting: *Deleted

## 2011-11-15 ENCOUNTER — Other Ambulatory Visit: Payer: Medicaid Other

## 2011-11-15 ENCOUNTER — Ambulatory Visit: Payer: Medicaid Other | Admitting: Infectious Disease

## 2011-11-15 DIAGNOSIS — B20 Human immunodeficiency virus [HIV] disease: Secondary | ICD-10-CM

## 2011-11-15 NOTE — Telephone Encounter (Signed)
Left message on home phone ID recording - Dr Abner Greenspan called [phone] and has approved  Zomig #30 instead of #12 - I do not know for how long pt can get #30. Dr Abner Greenspan reminded pt she needs to talk to neurology about h/a and meds. Stanton Kidney Ray Glacken RN 11/15/11 3:30PM

## 2011-11-16 LAB — CBC WITH DIFFERENTIAL/PLATELET
Eosinophils Absolute: 0.2 10*3/uL (ref 0.0–0.7)
Eosinophils Relative: 3 % (ref 0–5)
HCT: 46.3 % — ABNORMAL HIGH (ref 36.0–46.0)
Lymphocytes Relative: 41 % (ref 12–46)
Lymphs Abs: 2.2 10*3/uL (ref 0.7–4.0)
MCH: 28.5 pg (ref 26.0–34.0)
MCV: 90.3 fL (ref 78.0–100.0)
Monocytes Absolute: 0.3 10*3/uL (ref 0.1–1.0)
RBC: 5.13 MIL/uL — ABNORMAL HIGH (ref 3.87–5.11)
RDW: 14.1 % (ref 11.5–15.5)
WBC: 5.3 10*3/uL (ref 4.0–10.5)

## 2011-11-16 LAB — COMPLETE METABOLIC PANEL WITH GFR
Albumin: 4.8 g/dL (ref 3.5–5.2)
CO2: 29 mEq/L (ref 19–32)
Calcium: 10.7 mg/dL — ABNORMAL HIGH (ref 8.4–10.5)
Chloride: 101 mEq/L (ref 96–112)
GFR, Est African American: >89 mL/min
GFR, Est Non African American: >89 mL/min
Glucose, Bld: 97 mg/dL (ref 70–99)
Potassium: 4.2 mEq/L (ref 3.5–5.3)
Sodium: 139 mEq/L (ref 135–145)
Total Protein: 8 g/dL (ref 6.0–8.3)

## 2011-11-29 ENCOUNTER — Ambulatory Visit: Payer: Medicaid Other | Admitting: Infectious Disease

## 2011-11-30 ENCOUNTER — Telehealth: Payer: Self-pay | Admitting: Licensed Clinical Social Worker

## 2011-11-30 NOTE — Telephone Encounter (Signed)
I left a message on the patient's voicemail to reschedule missed appointment on 5/28, patient normally comes to appointments.

## 2011-12-01 ENCOUNTER — Telehealth: Payer: Self-pay | Admitting: Licensed Clinical Social Worker

## 2011-12-01 NOTE — Telephone Encounter (Signed)
Patient called stating she missed her appointment and wanted to know if we can just give her the results over the phone.

## 2011-12-02 NOTE — Telephone Encounter (Signed)
I would like her to be seen by a provider to discuss labs.

## 2011-12-05 ENCOUNTER — Telehealth: Payer: Self-pay | Admitting: *Deleted

## 2011-12-05 ENCOUNTER — Ambulatory Visit: Payer: Medicaid Other | Admitting: Sports Medicine

## 2011-12-05 NOTE — Telephone Encounter (Signed)
Ok I will call her and let her know.

## 2011-12-05 NOTE — Telephone Encounter (Signed)
Thanks Tamika. 

## 2011-12-05 NOTE — Telephone Encounter (Signed)
I told her that the md wanted her to be seen by md about her labs.. Transferred to front to make an appt

## 2011-12-05 NOTE — Telephone Encounter (Signed)
She did not make an appt but told the front office that Dr. Daiva Eves will give her the values over the phone. I left her a message that I will check with Dr. Daiva Eves on Wednesday when he is here

## 2011-12-07 ENCOUNTER — Other Ambulatory Visit: Payer: Self-pay | Admitting: Infectious Disease

## 2011-12-07 DIAGNOSIS — B20 Human immunodeficiency virus [HIV] disease: Secondary | ICD-10-CM

## 2011-12-09 NOTE — Telephone Encounter (Signed)
[name] please tell her that her numbers do look good but that she DOES need to make an appt with me in the next few months!

## 2011-12-13 ENCOUNTER — Telehealth: Payer: Self-pay | Admitting: *Deleted

## 2011-12-13 NOTE — Telephone Encounter (Signed)
Spoke with pt.  Pt stated that she did not need to see MD, that Dr. Daiva Eves told her that she could wait until appt in September.  Appt cancelled.

## 2011-12-13 NOTE — Telephone Encounter (Signed)
I gave her this message. She has an appt in September & will keep that. Cancelled the one for tomorrow

## 2011-12-14 ENCOUNTER — Ambulatory Visit: Payer: Medicaid Other | Admitting: Internal Medicine

## 2011-12-26 ENCOUNTER — Ambulatory Visit (INDEPENDENT_AMBULATORY_CARE_PROVIDER_SITE_OTHER): Payer: Medicaid Other | Admitting: Internal Medicine

## 2011-12-26 ENCOUNTER — Encounter: Payer: Self-pay | Admitting: Internal Medicine

## 2011-12-26 VITALS — BP 121/82 | HR 70 | Temp 97.7°F | Ht 65.0 in | Wt 237.0 lb

## 2011-12-26 DIAGNOSIS — R339 Retention of urine, unspecified: Secondary | ICD-10-CM

## 2011-12-26 DIAGNOSIS — G894 Chronic pain syndrome: Secondary | ICD-10-CM

## 2011-12-26 DIAGNOSIS — T300 Burn of unspecified body region, unspecified degree: Secondary | ICD-10-CM | POA: Insufficient documentation

## 2011-12-26 DIAGNOSIS — R079 Chest pain, unspecified: Secondary | ICD-10-CM

## 2011-12-26 DIAGNOSIS — B20 Human immunodeficiency virus [HIV] disease: Secondary | ICD-10-CM

## 2011-12-26 DIAGNOSIS — E785 Hyperlipidemia, unspecified: Secondary | ICD-10-CM

## 2011-12-26 DIAGNOSIS — G43909 Migraine, unspecified, not intractable, without status migrainosus: Secondary | ICD-10-CM

## 2011-12-26 DIAGNOSIS — F419 Anxiety disorder, unspecified: Secondary | ICD-10-CM

## 2011-12-26 DIAGNOSIS — F329 Major depressive disorder, single episode, unspecified: Secondary | ICD-10-CM

## 2011-12-26 DIAGNOSIS — Z79899 Other long term (current) drug therapy: Secondary | ICD-10-CM

## 2011-12-26 DIAGNOSIS — X19XXXA Contact with other heat and hot substances, initial encounter: Secondary | ICD-10-CM

## 2011-12-26 DIAGNOSIS — G2581 Restless legs syndrome: Secondary | ICD-10-CM

## 2011-12-26 DIAGNOSIS — E538 Deficiency of other specified B group vitamins: Secondary | ICD-10-CM

## 2011-12-26 DIAGNOSIS — G8929 Other chronic pain: Secondary | ICD-10-CM

## 2011-12-26 DIAGNOSIS — T2109XA Burn of unspecified degree of other site of trunk, initial encounter: Secondary | ICD-10-CM

## 2011-12-26 DIAGNOSIS — Z131 Encounter for screening for diabetes mellitus: Secondary | ICD-10-CM

## 2011-12-26 LAB — LIPID PANEL
LDL Cholesterol: 135 mg/dL — ABNORMAL HIGH (ref 0–99)
Triglycerides: 373 mg/dL — ABNORMAL HIGH (ref <150)
VLDL: 75 mg/dL — ABNORMAL HIGH (ref 0–40)

## 2011-12-26 LAB — POCT GLYCOSYLATED HEMOGLOBIN (HGB A1C): Hemoglobin A1C: 5.3

## 2011-12-26 LAB — GLUCOSE, CAPILLARY: Glucose-Capillary: 97 mg/dL (ref 70–99)

## 2011-12-26 MED ORDER — OXYCODONE-ACETAMINOPHEN 5-325 MG PO TABS: 1.0000 | ORAL_TABLET | Freq: Four times a day (QID) | ORAL | Status: DC | PRN

## 2011-12-26 MED ORDER — ALPRAZOLAM 2 MG PO TABS: ORAL_TABLET | ORAL | Status: DC

## 2011-12-26 MED ORDER — SCOPOLAMINE 1 MG/3DAYS TD PT72: 1.0000 | MEDICATED_PATCH | TRANSDERMAL | Status: DC | PRN

## 2011-12-26 MED ORDER — FLUCONAZOLE 100 MG PO TABS: 100.0000 mg | ORAL_TABLET | Freq: Every day | ORAL | Status: DC

## 2011-12-26 MED ORDER — CARISOPRODOL 250 MG PO TABS: 250.0000 mg | ORAL_TABLET | Freq: Three times a day (TID) | ORAL | Status: DC | PRN

## 2011-12-26 MED ORDER — PRAMIPEXOLE DIHYDROCHLORIDE 0.25 MG PO TABS: 0.2500 mg | ORAL_TABLET | Freq: Every day | ORAL | Status: DC

## 2011-12-26 MED ORDER — TRAZODONE HCL 50 MG PO TABS: 50.0000 mg | ORAL_TABLET | Freq: Every day | ORAL | Status: DC

## 2011-12-26 MED ORDER — CYANOCOBALAMIN 1000 MCG/ML IJ SOLN: 1000.0000 ug | INTRAMUSCULAR | Status: DC

## 2011-12-26 MED ORDER — NITROGLYCERIN 0.3 MG SL SUBL: 0.4000 mg | SUBLINGUAL_TABLET | SUBLINGUAL | Status: DC | PRN

## 2011-12-26 MED ORDER — CYANOCOBALAMIN 1000 MCG/ML IJ SOLN
1000.0000 ug | Freq: Once | INTRAMUSCULAR | Status: AC
  Administered 2011-12-26: 1000 ug via INTRAMUSCULAR

## 2011-12-26 NOTE — Assessment & Plan Note (Signed)
Given Rx for 8/15, and 9/15. Prescriptions are copied in the pain contract chart. - UDS today - Continue Percocet

## 2011-12-26 NOTE — Assessment & Plan Note (Signed)
As has been the case since I met her nearly one year ago, the patient continues to be on multiple sedating medicines and has an extremely long medicine list. She also continues to be quite demanding when it comes to refills in various types of medicines. I'm concerned that when it is patient will come to clinic or to the ED very somnolent and disoriented and that she is at risk for having an accident or overdose. That said, the patient does appear to take her medicines as prescribed, but again, she is very demanding. She takes medicines for nearly every symptom she encounters. She always thinks that she is right in regards to what medicines would be best for her. She does not listen to her opinion always. An appropriate row, I would like a slightly clean and have her on nearly no medicines other than her HIV medicines, but she would just seek another provider. It is a very difficult situation.

## 2011-12-26 NOTE — Progress Notes (Signed)
Subjective:     Patient ID: [name], female   DOB: [birth year], 46 y.o.   MRN: [id number]  HPI Patient is a 55 old female with extensive past medical history including HIV, history of non-Hodgkin's lymphoma, depression, chronic migraines, recent urinary retention, and chronic lower back pain who comes for followup of multiple medical issues.   #Lower back pain: Con't back and b/l pain knee pain, recently seen by sports medicine.  #Urinary retention: seen by [facility], has had recovery with bladder therapy and flomax.  PVR at f/u office visit was minimal.  Doing okay overal.  #Migraine headaches: saw neuro, had multiple injections to head for migraine treatment. Continues to suffer from multiple headaches per week and takes Zomig sometimes multiple times per day. Continues to take Topamax.   #Depression: has had family stressors  (grandmother, brother-in-law, nephew). Denies SI.   #HIV: has appt with Dr. Arnetha Massy in September.    Review of Systems As per HPI    Objective:   Physical Exam VItal signs reviewed and stable. GEN: No apparent distress.  Alert and oriented x 3.  Pleasant, conversant, and cooperative to exam. HEENT: NCAT.  Neck is supple without palpable masses or lymphadenopathy.  EOMI.  PERRLA.  Sclerae anicteric.  Conjunctivae noninjected. MMM.  Oropharynx is without erythema, exudates, or other abnormal lesions. EXT: warm and dry.  Peripheral pulses equal, intact, and +2 globally.  No clubbing or cyanosis.  No edema in b/l lower extremeties SKIN: quarter sized burn through epidermis on hip-pannus.  Tissue has mild erythema but does not look infected. NEURO: ambulating w/o difficulty     Assessment:         Plan:

## 2011-12-26 NOTE — Assessment & Plan Note (Signed)
Followed by Dr. VD. Recent HIV vitals indicate excellent control. Patient still taking fluconazole when necessary because of vaginal itching. She has demanded multiple refills. I would like her to followup with Dr. Algis Liming.

## 2011-12-26 NOTE — Assessment & Plan Note (Addendum)
Denies suicidal ideation, patient continues to have emotional lability and difficulty sleeping. She is on Cymbalta. We will try some trazodone for sleep at the patient's request. Could consider changing agents, Cymbalta does affect the bladder, and her urologist was hoping to get her off of that.

## 2011-12-26 NOTE — Assessment & Plan Note (Signed)
Seen by urology in May with minimal PVR, on flomax.  They would like her to continue flomax and f/u in one year.

## 2011-12-26 NOTE — Assessment & Plan Note (Signed)
Seen by neurology, just got injections. Per neurology, okay to continue on Zomig at much higher doses than recommended, nearly daily. Given this patient's extensive history and polypharmacy, I'm not entirely sure how to best treat her migraine headaches. I will defer to neurology's management at this point.
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
## 2011-12-26 NOTE — Assessment & Plan Note (Signed)
See Dr. Algis Liming prior notes, but patient needs to be on Crestor and not Zocor. - Recheck lipids today - Screening for diabetes with A1c and glucose today, as patient does have a history of DM

## 2011-12-27 LAB — PRESCRIPTION ABUSE MONITORING 15P, URINE
Amphetamine/Meth: NEGATIVE ng/mL
Barbiturate Screen, Urine: NEGATIVE ng/mL
Buprenorphine, Urine: NEGATIVE ng/mL
Meperidine, Ur: NEGATIVE ng/mL
Methadone Screen, Urine: NEGATIVE ng/mL
Propoxyphene: NEGATIVE ng/mL

## 2011-12-28 LAB — OPIATES/OPIOIDS (LC/MS-MS)
Codeine Urine: NEGATIVE NG/ML
Hydrocodone: NEGATIVE NG/ML
Oxycodone, ur: 1620 NG/ML — ABNORMAL HIGH

## 2011-12-28 LAB — BENZODIAZEPINES (GC/LC/MS), URINE
Alprazolam metabolite (GC/LC/MS), ur confirm: 199 NG/ML — ABNORMAL HIGH
Diazepam (GC/LC/MS), ur confirm: NEGATIVE NG/ML
Flunitrazepam metabolite (GC/LC/MS), ur confirm: NEGATIVE NG/ML
Oxazepam (GC/LC/MS), ur confirm: NEGATIVE NG/ML
Temazepam (GC/LC/MS), ur confirm: NEGATIVE NG/ML

## 2012-01-04 ENCOUNTER — Ambulatory Visit (INDEPENDENT_AMBULATORY_CARE_PROVIDER_SITE_OTHER): Payer: Medicaid Other | Admitting: Sports Medicine

## 2012-01-04 ENCOUNTER — Other Ambulatory Visit: Payer: Self-pay | Admitting: *Deleted

## 2012-01-04 VITALS — BP 111/83

## 2012-01-04 DIAGNOSIS — Q762 Congenital spondylolisthesis: Secondary | ICD-10-CM

## 2012-01-04 DIAGNOSIS — M79671 Pain in right foot: Secondary | ICD-10-CM | POA: Insufficient documentation

## 2012-01-04 DIAGNOSIS — M4310 Spondylolisthesis, site unspecified: Secondary | ICD-10-CM

## 2012-01-04 DIAGNOSIS — M79609 Pain in unspecified limb: Secondary | ICD-10-CM

## 2012-01-04 MED ORDER — TRAZODONE HCL 50 MG PO TABS: 100.0000 mg | ORAL_TABLET | Freq: Every day | ORAL | Status: DC

## 2012-01-04 MED ORDER — DULOXETINE HCL 30 MG PO CPEP: 90.0000 mg | ORAL_CAPSULE | Freq: Every day | ORAL | Status: DC

## 2012-01-04 NOTE — Assessment & Plan Note (Signed)
Trial with arch pads  Add these to other shoes if helping  Hopefully she will see less referred pain as the back is treated

## 2012-01-04 NOTE — Assessment & Plan Note (Addendum)
No changes on last xray of lumbar spine.  Plan: Spine flexion exercises. Trazodone increased to 100 mg QHS. We will try to get her into formal PT to emphasize flexion and abd strength work  Reck 6 weeks

## 2012-01-04 NOTE — Patient Instructions (Signed)
You have a condition called Spondylolisthesis of your lumbar spine. The exercises that will help your condition are flexion. You can do sit ups, knee to chest and crunches. Your trazodone has been increased to 100 mg at night. Continue taking rest of medications as prescribed.

## 2012-01-04 NOTE — Progress Notes (Deleted)
Subjective:    Patient ID: [name] is a 46 y.o. female.  Chief Complaint: HPI {ZOX:[id number]} {Also Blocks:[id number]::" "}   Social History   Occupational History  . Not on file.   Social History Main Topics  . Smoking status: Never Smoker   . Smokeless tobacco: Not on file  . Alcohol Use: No  . Drug Use: No  . Sexually Active: Not Currently    ROS       Objective:   Ortho Exam       Assessment:     1. Spondylisthesis        Plan:     ***

## 2012-01-04 NOTE — Progress Notes (Signed)
  Subjective:    Patient ID: [name], female    DOB: 08/17/1965, 46 y.o.   MRN: [id number]  HPI Pt with hx of Spondylolisthesis of Lumbar Spine that comes today complaining of lower back pain. She has been with increased pain for a week after trying to lace her shoe and heard a "pop" on her back.  The pain it is described as aching 5/10, does not wake her up at night but makes it difficult to fall sleep contributing to her insomnia. The pain radiates to the side of right leg all the way to her R knee. She has tried ice and heat in the affected area with no improvement of her symptoms. Physical therapy is not at option at this time due to financial difficulties/insurance but this has help in the past with flares of her back pain.  She feels a lot of burning pain in RT foot Also c/o arch feeling more strained   Review of Systems Per HPI    Objective:   Physical Exam  Constitutional:       Mild distress due to pain.   Musculoskeletal:       Normal ROM of R hip and R knee. Tenderness of lumbar aspect of spine more around L4-L5. Positive leg raise on RLE.   Neurological:       Decreased patellar and  reflex right LE. Strength and sensation intact.    RT foot shows navicular drop and flattening of long arch Not TTP over PF but more over long arch     Assessment & Plan:

## 2012-01-19 ENCOUNTER — Other Ambulatory Visit: Payer: Self-pay | Admitting: *Deleted

## 2012-01-19 ENCOUNTER — Telehealth: Payer: Self-pay | Admitting: *Deleted

## 2012-01-19 DIAGNOSIS — E785 Hyperlipidemia, unspecified: Secondary | ICD-10-CM

## 2012-01-19 DIAGNOSIS — E538 Deficiency of other specified B group vitamins: Secondary | ICD-10-CM

## 2012-01-19 DIAGNOSIS — B20 Human immunodeficiency virus [HIV] disease: Secondary | ICD-10-CM

## 2012-01-19 DIAGNOSIS — R635 Abnormal weight gain: Secondary | ICD-10-CM

## 2012-01-19 DIAGNOSIS — F329 Major depressive disorder, single episode, unspecified: Secondary | ICD-10-CM

## 2012-01-19 DIAGNOSIS — G43909 Migraine, unspecified, not intractable, without status migrainosus: Secondary | ICD-10-CM

## 2012-01-19 MED ORDER — ROSUVASTATIN CALCIUM 20 MG PO TABS: 20.0000 mg | ORAL_TABLET | Freq: Every day | ORAL | Status: DC

## 2012-01-19 NOTE — Telephone Encounter (Signed)
Pt agreed to have GYN, Dr. Tamela Oddi, send PAP smear results to [facility] for our records.  Pt requested that she obtain monthly B12 injections at [facility] rather that at [facility] due to convenience with the construction at [facility].  RN will check to see if this is possible.  Pt's 17-month return appt w/ Dr. Daiva Eves is scheduled for Sept. 2013.

## 2012-01-19 NOTE — Telephone Encounter (Signed)
THat is fine if injections can be accomodated. Thanks Angelique Blonder!

## 2012-01-25 ENCOUNTER — Other Ambulatory Visit: Payer: Self-pay | Admitting: Obstetrics & Gynecology

## 2012-01-25 DIAGNOSIS — Z1231 Encounter for screening mammogram for malignant neoplasm of breast: Secondary | ICD-10-CM

## 2012-01-26 ENCOUNTER — Other Ambulatory Visit: Payer: Self-pay | Admitting: *Deleted

## 2012-01-26 DIAGNOSIS — E785 Hyperlipidemia, unspecified: Secondary | ICD-10-CM

## 2012-01-26 DIAGNOSIS — E538 Deficiency of other specified B group vitamins: Secondary | ICD-10-CM

## 2012-01-26 DIAGNOSIS — B20 Human immunodeficiency virus [HIV] disease: Secondary | ICD-10-CM

## 2012-01-26 DIAGNOSIS — G43909 Migraine, unspecified, not intractable, without status migrainosus: Secondary | ICD-10-CM

## 2012-01-26 DIAGNOSIS — R635 Abnormal weight gain: Secondary | ICD-10-CM

## 2012-01-26 DIAGNOSIS — F329 Major depressive disorder, single episode, unspecified: Secondary | ICD-10-CM

## 2012-01-26 MED ORDER — ROSUVASTATIN CALCIUM 20 MG PO TABS: 20.0000 mg | ORAL_TABLET | Freq: Every day | ORAL | Status: DC

## 2012-01-26 NOTE — Telephone Encounter (Signed)
Prior Authorization received for Crestor for 12 months.  #[id number].

## 2012-01-26 NOTE — Telephone Encounter (Signed)
Left message for patient with Dr. Zenaida Niece Dam's comments.  Pt given [facility] telephone number to schedule an appt for the injection.  Reminded pt that the construction at [facility] was completed and that getting to the [facility] was much easier.

## 2012-02-01 ENCOUNTER — Other Ambulatory Visit: Payer: Self-pay | Admitting: *Deleted

## 2012-02-01 DIAGNOSIS — G894 Chronic pain syndrome: Secondary | ICD-10-CM

## 2012-02-01 DIAGNOSIS — F419 Anxiety disorder, unspecified: Secondary | ICD-10-CM

## 2012-02-01 MED ORDER — ALPRAZOLAM 2 MG PO TABS: ORAL_TABLET | ORAL | Status: DC

## 2012-02-01 MED ORDER — CARISOPRODOL 250 MG PO TABS: 250.0000 mg | ORAL_TABLET | Freq: Three times a day (TID) | ORAL | Status: DC | PRN

## 2012-02-01 MED ORDER — TRAZODONE HCL 50 MG PO TABS: 100.0000 mg | ORAL_TABLET | Freq: Every day | ORAL | Status: DC

## 2012-02-01 NOTE — Telephone Encounter (Signed)
Rx called in 

## 2012-02-02 ENCOUNTER — Encounter: Payer: Self-pay | Admitting: Sports Medicine

## 2012-02-02 ENCOUNTER — Other Ambulatory Visit: Payer: Self-pay | Admitting: *Deleted

## 2012-02-02 ENCOUNTER — Ambulatory Visit
Admission: RE | Admit: 2012-02-02 | Discharge: 2012-02-02 | Disposition: A | Discharge status: Home / Self Care | Payer: Medicaid Other | Source: Ambulatory Visit | Attending: Sports Medicine | Admitting: Sports Medicine

## 2012-02-02 ENCOUNTER — Ambulatory Visit (INDEPENDENT_AMBULATORY_CARE_PROVIDER_SITE_OTHER): Payer: Medicaid Other | Admitting: Sports Medicine

## 2012-02-02 VITALS — BP 121/73 | HR 75

## 2012-02-02 DIAGNOSIS — R29898 Other symptoms and signs involving the musculoskeletal system: Secondary | ICD-10-CM

## 2012-02-02 DIAGNOSIS — M24859 Other specific joint derangements of unspecified hip, not elsewhere classified: Secondary | ICD-10-CM

## 2012-02-02 DIAGNOSIS — M25559 Pain in unspecified hip: Secondary | ICD-10-CM

## 2012-02-02 DIAGNOSIS — K219 Gastro-esophageal reflux disease without esophagitis: Secondary | ICD-10-CM

## 2012-02-02 DIAGNOSIS — B20 Human immunodeficiency virus [HIV] disease: Secondary | ICD-10-CM

## 2012-02-02 MED ORDER — DARUNAVIR ETHANOLATE 800 MG PO TABS: 800.0000 mg | ORAL_TABLET | Freq: Every day | ORAL | Status: DC

## 2012-02-02 MED ORDER — RITONAVIR 100 MG PO TABS: 100.0000 mg | ORAL_TABLET | Freq: Every day | ORAL | Status: DC

## 2012-02-02 MED ORDER — EMTRICITABINE-TENOFOVIR DF 200-300 MG PO TABS: 1.0000 | ORAL_TABLET | Freq: Every day | ORAL | Status: DC

## 2012-02-02 MED ORDER — ESOMEPRAZOLE MAGNESIUM 20 MG PO CPDR: 20.0000 mg | DELAYED_RELEASE_CAPSULE | Freq: Two times a day (BID) | ORAL | Status: DC

## 2012-02-02 NOTE — Telephone Encounter (Signed)
Unclear indication for exceptionally high dose of esomeprazole.  Last EGD in 2011 was normal.  Problem list has GERD but not Z-E syndrome.  Also notes polypharmacy.  Without compelling indication for 40 mg PO BID will decrease dose to 20 mg PO BID with plans on further titration to 20 mg PO QD if symptomatically controlled on the 20 mg PO BID dosing.

## 2012-02-02 NOTE — Progress Notes (Signed)
  Subjective:    Patient ID: [name], female    DOB: 06-17-1966, 46 y.o.   MRN: [id number]  HPI Chief complaint: Left hip pain   46 year old female comes in today complaining of one week of left hip pain. Acute onset of pain that occurred while walking. Pain was diffuse involving both the lateral and anterior hip. At times it radiates to the knee. Pain was accompanied by a pop. Since then she's had intermittent popping and pain and has noticed some swelling in the left thigh. Symptoms are intermittent and different in nature than the pain she has experienced previously with her spondylolisthesis. No pain in the right hip.  Past medical history is reviewed. History of spondylolisthesis, HIV, depression, and chronic pain syndrome. Medications are reviewed. She has recently had her trazodone increased to 100 mg each bedtime. This has been helpful for with her chronic low back.   Review of Systems     Objective:   Physical Exam No acute distress, awake alert and oriented x3.  Right hip shows smooth painless hip range of motion with a negative log roll. She is tender to palpation over the left greater trochanteric bursa some tenderness over the hip flexors as well. 5/5 strength with resisted hip flexion. 4/5 strength with resisted hip abduction, limited mainly by pain. No gross deformity. She is neurovascularly intact distally. Walking with a slight limp.       Assessment & Plan:  1. 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 History of spondylolisthesis 3. Chronic pain syndrome 4. Depression 5.HIV  Patient is given a home exercise program to help strengthen her hip abductors. IT band stretches were also given. She can continue on her trazodone at 100 mg each bedtime. X-rays of her hip including an AP pelvis and lateral left hip to rule out possible intra-articular loose body. Phone followup after x-rays to go over the results.

## 2012-02-03 ENCOUNTER — Telehealth: Payer: Self-pay | Admitting: *Deleted

## 2012-02-03 MED ORDER — NABUMETONE 750 MG PO TABS: ORAL_TABLET | ORAL | Status: DC

## 2012-02-03 NOTE — Telephone Encounter (Signed)
Message copied by Mora Bellman on Fri Feb 03, 2012 12:31 PM ------      Message from: Reino Bellis R      Created: Thu Feb 02, 2012  2:21 PM      Regarding: xray       Please call her and tell her her xray looks ok. No fracture and no loose body seen.      ----- Message -----         From: Rad Results In Interface         Sent: 02/02/2012  12:44 PM           To: Ralene Cork, DO

## 2012-02-03 NOTE — Telephone Encounter (Signed)
Gave pt the message about x-ray.  She states her hip is very painful still- popped out of place again last night.  Wants to know if there is anti-inflammatory she can use for pain?  Called in Relafen per Dr. Margaretha Sheffield.

## 2012-02-09 ENCOUNTER — Ambulatory Visit: Payer: Medicaid Other

## 2012-02-17 ENCOUNTER — Ambulatory Visit: Payer: Medicaid Other

## 2012-02-21 ENCOUNTER — Ambulatory Visit (INDEPENDENT_AMBULATORY_CARE_PROVIDER_SITE_OTHER): Payer: Medicaid Other | Admitting: Internal Medicine

## 2012-02-21 ENCOUNTER — Encounter: Payer: Self-pay | Admitting: Internal Medicine

## 2012-02-21 VITALS — BP 110/73 | HR 83 | Temp 98.9°F | Ht 66.0 in | Wt 239.1 lb

## 2012-02-21 DIAGNOSIS — Z79899 Other long term (current) drug therapy: Secondary | ICD-10-CM

## 2012-02-21 DIAGNOSIS — E785 Hyperlipidemia, unspecified: Secondary | ICD-10-CM

## 2012-02-21 DIAGNOSIS — E538 Deficiency of other specified B group vitamins: Secondary | ICD-10-CM

## 2012-02-21 DIAGNOSIS — B20 Human immunodeficiency virus [HIV] disease: Secondary | ICD-10-CM

## 2012-02-21 DIAGNOSIS — G4700 Insomnia, unspecified: Secondary | ICD-10-CM

## 2012-02-21 DIAGNOSIS — K5900 Constipation, unspecified: Secondary | ICD-10-CM

## 2012-02-21 DIAGNOSIS — M949 Disorder of cartilage, unspecified: Secondary | ICD-10-CM

## 2012-02-21 DIAGNOSIS — R059 Cough, unspecified: Secondary | ICD-10-CM

## 2012-02-21 DIAGNOSIS — C8590 Non-Hodgkin lymphoma, unspecified, unspecified site: Secondary | ICD-10-CM

## 2012-02-21 DIAGNOSIS — C8589 Other specified types of non-Hodgkin lymphoma, extranodal and solid organ sites: Secondary | ICD-10-CM

## 2012-02-21 DIAGNOSIS — G894 Chronic pain syndrome: Secondary | ICD-10-CM

## 2012-02-21 DIAGNOSIS — M4310 Spondylolisthesis, site unspecified: Secondary | ICD-10-CM

## 2012-02-21 DIAGNOSIS — R05 Cough: Secondary | ICD-10-CM

## 2012-02-21 DIAGNOSIS — Q762 Congenital spondylolisthesis: Secondary | ICD-10-CM

## 2012-02-21 DIAGNOSIS — J3489 Other specified disorders of nose and nasal sinuses: Secondary | ICD-10-CM

## 2012-02-21 DIAGNOSIS — R339 Retention of urine, unspecified: Secondary | ICD-10-CM

## 2012-02-21 DIAGNOSIS — G43909 Migraine, unspecified, not intractable, without status migrainosus: Secondary | ICD-10-CM

## 2012-02-21 DIAGNOSIS — M8580 Other specified disorders of bone density and structure, unspecified site: Secondary | ICD-10-CM

## 2012-02-21 DIAGNOSIS — F411 Generalized anxiety disorder: Secondary | ICD-10-CM

## 2012-02-21 DIAGNOSIS — B3749 Other urogenital candidiasis: Secondary | ICD-10-CM

## 2012-02-21 DIAGNOSIS — F419 Anxiety disorder, unspecified: Secondary | ICD-10-CM

## 2012-02-21 MED ORDER — CYANOCOBALAMIN 1000 MCG/ML IJ SOLN
1000.0000 ug | Freq: Once | INTRAMUSCULAR | Status: AC
  Administered 2012-02-21: 1000 ug via INTRAMUSCULAR

## 2012-02-21 MED ORDER — OXYCODONE-ACETAMINOPHEN 5-325 MG PO TABS: 1.0000 | ORAL_TABLET | Freq: Four times a day (QID) | ORAL | Status: DC | PRN

## 2012-02-21 MED ORDER — IPRATROPIUM-ALBUTEROL 18-103 MCG/ACT IN AERO: 2.0000 | INHALATION_SPRAY | Freq: Four times a day (QID) | RESPIRATORY_TRACT | Status: DC | PRN

## 2012-02-21 MED ORDER — TERCONAZOLE 0.8 % VA CREA: 1.0000 | TOPICAL_CREAM | Freq: Every day | VAGINAL | Status: DC

## 2012-02-21 MED ORDER — CYANOCOBALAMIN 1000 MCG/ML IJ SOLN: 1000.0000 ug | INTRAMUSCULAR | Status: DC

## 2012-02-21 MED ORDER — DICLOFENAC SODIUM 1 % TD GEL: 1.0000 "application " | Freq: Two times a day (BID) | TRANSDERMAL | Status: DC | PRN

## 2012-02-21 MED ORDER — FLUCONAZOLE 100 MG PO TABS: 100.0000 mg | ORAL_TABLET | Freq: Every day | ORAL | Status: DC
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
## 2012-02-21 NOTE — Assessment & Plan Note (Signed)
On Trazadone, dose recently increased. Claims to be working.  Suffers from anxiety and accounts that for insomnia. On Xanax.    -Continue trazadone

## 2012-02-21 NOTE — Assessment & Plan Note (Signed)
Claims to suffer from long term anxiety.  Relief with Xanax.  Asking for refill for the future to be sent to pharmacy.  -will follow up with attending in regards to ordering refills at this time.

## 2012-02-21 NOTE — Assessment & Plan Note (Signed)
Claims of two week history of current sinus pressure around eyes, tops of cheeks, and associated with migraines.  Occasional sore through, runny or congested nose, and dried up blood clots in nose.  Claims hx of sinus infections to be treated with Ciprofloxacin or Doxycycline.  Physical exam and descrition did not seem to support sinus infection at this time or acute infection, appears more allergic in nature  -Take otc daily anti-allergy such as claritin -no relief, return to clinic

## 2012-02-21 NOTE — Assessment & Plan Note (Addendum)
Claims to be on diflucan and Terazol cream citing occasional yeast.  Refused pelvic exam or workup of any new UTI or infection.  Asking for diflucan and Terazol cream refills.  Claims to "know her body better" and that she has no new infection and that all this is from her current HIV medication and soma causes vaginal itching but she wants to continue soma at this time.  Claims to follow up with OB-GYN at [facility] where she gets her annual pap smear, will ask for results to be sent to clinic.  Claimed to have possible malignant cells on previous pap, needs to follow up  -f/u with gynecologist as soon as possible for health maintenance and papsmear and clarification on results -send results for follow up -speak with gynecologist in regards to persistent vaginal infections or discomfort.

## 2012-02-21 NOTE — Assessment & Plan Note (Addendum)
Given b12 injection in clinic today, notes from [facility] and Dr. Daiva Eves refer to patient wanting B12 injection and can get it.  She claims to get one almost monthly and also requests a prescription for them so that she can get them administered in [place] instead of having to drive to clinic.  -Z61 injection given today -Prescribed one more injection for the future -return to clinic in one month

## 2012-02-21 NOTE — Assessment & Plan Note (Addendum)
Diagnosed 9-10 years ago, in remission as per Ms. Ferron.  -f/u with oncologist

## 2012-02-21 NOTE — Assessment & Plan Note (Signed)
Last lipid panel from [facility] 2013: VLDL:75, LDL:135, HDL:33, TG:373, CHOL:243  Counseled on diet and weight loss and exercise.   Claims levels elevated due to HIV medication and less because of her diet.   -weight loss, exercise, nutrition control -continue crestor

## 2012-02-21 NOTE — Assessment & Plan Note (Signed)
On Flomax as per urologist and claims has helped tremendously.  Discussed rare to give flomax in females but claims was given by urologist and trusts it works.   -f/u urologist.

## 2012-02-21 NOTE — Assessment & Plan Note (Signed)
On triptan  Claims to have recent migraines associated with possible sinus infection  -F/u neurology -Continue triptan -Consider otc allergy medication for possible sinus relief and current complaints.

## 2012-02-21 NOTE — Patient Instructions (Signed)
Please continue to take your regular medications  Your prescriptions have been sent to your pharmacy  B12 injection given during this visit  Please return to clinic next month for follow up visit  Please follow up with specialists office as scheduled  Please have your medical records faxed to our office.
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
## 2012-02-21 NOTE — Assessment & Plan Note (Addendum)
On percocet and soma--requesting refills, claims to get percocet prescriptions three months at a time, gives straight to pharmacy dated to be dispensed only monthly before hand by provider.  Discussed case with attending--recommends pain management.  On multiple sedating drugs and pain medication, denies any sedation or side effects.    Sees Dr. Darrick Penna and ortho for evaluation of back pain, hips, and knees.  Claims to have follow up appointment this month. Will discuss pain and management with Dr. Darrick Penna as well.     -Given percocet for October 15th, 2013--signed out in notebook in clinic -return to clinic in September for follow up -f/u with Dr. Darrick Penna in regards to joint pain -f/u pain management

## 2012-02-21 NOTE — Assessment & Plan Note (Signed)
L5/S1 Takes Percocet and Soma Xrays done show mild joint degeneration Follows up with Dr. Darrick Penna

## 2012-02-21 NOTE — Assessment & Plan Note (Addendum)
Knows all her medications, demands refills for certain medications well in advance.  Claims to have trouble getting refills on time due to traveling difficulties from [place] to clinic and having available appointments.  Will refer to pain management.   Addendum:  [name] has been discussed in extensive detail with Dr. Kem Kays along with Mr [name].  As noted in Dr. Danice Goltz previous note in June 2013, [name] is EXTREMELY demanding in regards to her medications, does not listen to recommendations from her providers, and insists to continue current medications that are sedating.  She is under a pain contract for Percocet which she gets dated in advance and claims to take them directly to her pharmacy to be held there.  There are no violations as per contract to date, however, after I informed her that I will not be giving medications so far in advance she was not happy but did agree to return to clinic monthly for her prescription.  I have called and verified with the pharmacy, she did have a prescription for September on hold, however up until last week, she had not brought them the prescription we gave her dated for October, 2013 that she signed out.  The other issue is that she is on Soma and Xanax monthly as well.  Another 90 day refill was filled at the end of July, but she is asking for another prescription again.  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  While [name] does have serious complaints of pain and radiological studies along with sports medicine following, it is difficult to gauge if she does indeed need all the current medications she is on and their benefit.  I have spoken to sports medicine who will be seeing her on the 28th of August, 2013 in regards to if they recommend continuation of Soma. Dr. Margaretha Sheffield did note in his replay that he does not usually give Soma and that  perhaps Robaxin may be more appropriate.  I will attempt to speak to [name] again after her appointment with Sports medicine and also will review their notes in regards to need for current pain medication regimen.  After discussing the case with Dr. Kem Kays in length, [name] will clearly benefit from pain management consult which we will refer her too.  She is noted to have multiple allergies on chart which she denies for the most part and will need to go through each in detail but continues to be on multiple sedating medications along with anti-retroviral medications.  While we have discussed our concerns and side effects with her in length as has Dr. Berlinda Last, she continues to insist on continuing same regimen of medications.  She is also noted to take Xanax 2mg  TID, and denies any sedation.  After seeing her in clinic and reviewing records, it is advised for this to be tapered, she will likely not be in agreement to this as well.  Of note, she is also on Trazodone with dose recently increased and has also started taking Relafen for additional pain management with Dr. Margaretha Sheffield.  I agree with Dr. Berlinda Last that this is a very difficult situation involving a very demanding individual, polypharmacy, and multiple sedating medications.  She continues to want all her medications exactly as she demands despite being counseled extensively on risks and benefits and side-effects.

## 2012-02-21 NOTE — Assessment & Plan Note (Signed)
DEXA scan in October 2012 showed osteopenia with a Z score of -2.3. T. scores are within normal limits.  -Continue Calcium-VitD

## 2012-02-21 NOTE — Assessment & Plan Note (Signed)
Chronic constipation.  Discussed with [name] how narcotics cause constipation, but refuses to discontinue Percocet and claims that she resolves constipation with various otc regimens and has to keep changing medications so her body doesn't get used to it.  I offered stool softner this visit but she declined.  Says she has hx of IBS.  -Continue current self treatment -high fiber diet

## 2012-02-22 ENCOUNTER — Telehealth: Payer: Self-pay | Admitting: *Deleted

## 2012-02-22 MED ORDER — ALPRAZOLAM 2 MG PO TABS: ORAL_TABLET | ORAL | Status: DC

## 2012-02-22 NOTE — Telephone Encounter (Signed)
I called [name] back regarding her [name] not being continued.  I noted that Sherry P. RN received a similar call.  [name] explained to me at length how Dr. Darrick Penna recommended the addition of Percocet to the [facility] regimen she was using for rer back. She stressed no oversedation/SE.  She also noted that as of 1638 her pharmacy had not received her refill for Xanax.  She wants to avoid back surgery, especially with her HIV+ status, has an appointment with Dr. Darrick Penna next week per patient.  Understands pain contract and recommendations.

## 2012-02-22 NOTE — Telephone Encounter (Signed)
Pt was called - appt given to pt.  Also informed SOMA will not be given with Percocet anymore per Dr Virgina Organ. Pt was not very happy; stated SOMA is a muscle relaxant and Percocet is for the back pain. And she has been taking theses meds together For years; check with Dr Darrick Penna.  And she is almost out of this medication.  She wants me to relay this message to Dr Virgina Organ.

## 2012-02-22 NOTE — Telephone Encounter (Signed)
Message copied by Hassan Buckler on Wed Feb 22, 2012  2:30 PM ------      Message from: Louretta Parma L      Created: Wed Feb 22, 2012  2:27 PM                   ----- Message -----         From: Lianne Bushy         Sent: 02/22/2012   2:18 PM           To:             The appt has been scheduled for 03-20-12 @ 3:45 pm.  Given to [facility] to call the patient and make her aware of your instructions.  She is going to give the patient her appt also.            Thanks,            Music therapist      ----- Message -----         From: Darden Palmer, MD         Sent: 02/22/2012  12:05 PM           To: Imp Front Desk Pool            Please schedule for follow up appointment in mid September.  Let her know her prescriptions were called in please and also that soma cannot be given with percocet on board anymore.  Pain management will be needed.             Thank you

## 2012-02-22 NOTE — Progress Notes (Signed)
INTERNAL MEDICINE TEACHING ATTENDING ADDENDUM - Jonah Blue, DO: I personally saw and evaluated [name] in this clinic visit in conjunction with the resident, Darden Palmer. I have discussed patient's plan of care with medical resident during this visit. I have confirmed the physical exam findings and have read and agree with the clinic note including the plan with the following addition: Patient states she's having some vaginal itching and usually gets diflucan for this. We offered her a pelvic exam and swab for other etiologies (such as BV, etc) but she declines. She would rather follow up with her gynecologist. She is on multiple sedating medications and she was very insistent on continuing these. She is noted to have a narcotic contract with [place] without previous violations. I recommend she establishes with pain management to discuss weaning off some of these. She does not report excessive sleepiness or falls.

## 2012-02-24 NOTE — Telephone Encounter (Signed)
The xanax was refilled at pt's appt 8/20, i called the pharm this am 8/23 and it still had not been called in, i have now done this and the pharm will notify pt of refill

## 2012-02-26 ENCOUNTER — Encounter: Payer: Self-pay | Admitting: Internal Medicine

## 2012-02-26 NOTE — Progress Notes (Signed)
Case was discussed with resident physician Dr. Virgina Organ. [name] is on multiple sedating medications and is very demanding and resistant to simplify her regimen. She has radiographic evidence of Lumbar spine abnormalities to explain mild pain. She needs expert evaluation by pain management to consider simplifying her regimen. For now, will need to continue discussions with patient to wean off unnecesary medications. [name] shows no evidence of excessive sedation on my previous exam, but I continue to be concerned with regards to narcotic dependence. [name]

## 2012-02-26 NOTE — Addendum Note (Signed)
Addended by: Baltazar Apo on: 02/26/2012 09:59 AM   Modules accepted: Orders

## 2012-02-29 ENCOUNTER — Other Ambulatory Visit: Payer: Self-pay | Admitting: Sports Medicine

## 2012-02-29 ENCOUNTER — Ambulatory Visit (INDEPENDENT_AMBULATORY_CARE_PROVIDER_SITE_OTHER): Payer: Self-pay | Admitting: Sports Medicine

## 2012-02-29 ENCOUNTER — Ambulatory Visit
Admission: RE | Admit: 2012-02-29 | Discharge: 2012-02-29 | Disposition: A | Discharge status: Home / Self Care | Payer: Self-pay | Source: Ambulatory Visit | Attending: Sports Medicine | Admitting: Sports Medicine

## 2012-02-29 VITALS — BP 120/70 | Ht 66.0 in | Wt 230.0 lb

## 2012-02-29 DIAGNOSIS — M25569 Pain in unspecified knee: Secondary | ICD-10-CM

## 2012-02-29 DIAGNOSIS — S139XXA Sprain of joints and ligaments of unspecified parts of neck, initial encounter: Secondary | ICD-10-CM

## 2012-02-29 DIAGNOSIS — M25561 Pain in right knee: Secondary | ICD-10-CM

## 2012-02-29 DIAGNOSIS — S134XXA Sprain of ligaments of cervical spine, initial encounter: Secondary | ICD-10-CM | POA: Insufficient documentation
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
## 2012-02-29 NOTE — Patient Instructions (Addendum)
Today we injected your knee, and I hope this is helpful.  We are going to get x-rays of your knee, we will discuss the results with you at your follow up  Continue wearing your compression sleeve  We referred your for physical therapy, they will be giving you a call to set this up  Please follow up in 2-3 weeks for re-evaluation
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
## 2012-02-29 NOTE — Assessment & Plan Note (Addendum)
Patient's clinical exam today as well as ultrasound shows that patient does have what is likely a medial meniscal tear. Patient was given injection with corticosteroids today which she did tolerate well. Patient given a home exercise program and will do physical therapy to increase range of motion and strengthening.Physical therapy will be for both her neck and knee. Patient will return in 2-3 weeks for reevaluation. No medications was given to patient secondary to be polypharmacy that already is part of her regimen. We will order x-rays of her knee prior to her followup visit. If symptoms persist we may need to consider further diagnostic imaging.

## 2012-03-02 ENCOUNTER — Other Ambulatory Visit: Payer: Self-pay | Admitting: *Deleted

## 2012-03-02 MED ORDER — DICLOFENAC EPOLAMINE 1.3 % TD PTCH: MEDICATED_PATCH | TRANSDERMAL | Status: DC

## 2012-03-06 ENCOUNTER — Ambulatory Visit
Admission: RE | Admit: 2012-03-06 | Discharge: 2012-03-06 | Disposition: A | Discharge status: Home / Self Care | Payer: Medicaid Other | Source: Ambulatory Visit | Attending: Obstetrics & Gynecology | Admitting: Obstetrics & Gynecology

## 2012-03-06 ENCOUNTER — Other Ambulatory Visit (INDEPENDENT_AMBULATORY_CARE_PROVIDER_SITE_OTHER): Payer: Medicaid Other

## 2012-03-06 DIAGNOSIS — R635 Abnormal weight gain: Secondary | ICD-10-CM

## 2012-03-06 DIAGNOSIS — F329 Major depressive disorder, single episode, unspecified: Secondary | ICD-10-CM

## 2012-03-06 DIAGNOSIS — E538 Deficiency of other specified B group vitamins: Secondary | ICD-10-CM

## 2012-03-06 DIAGNOSIS — G43909 Migraine, unspecified, not intractable, without status migrainosus: Secondary | ICD-10-CM

## 2012-03-06 DIAGNOSIS — B20 Human immunodeficiency virus [HIV] disease: Secondary | ICD-10-CM

## 2012-03-06 DIAGNOSIS — Z1231 Encounter for screening mammogram for malignant neoplasm of breast: Secondary | ICD-10-CM

## 2012-03-06 DIAGNOSIS — E785 Hyperlipidemia, unspecified: Secondary | ICD-10-CM

## 2012-03-06 LAB — COMPLETE METABOLIC PANEL WITH GFR
ALT: 8 U/L (ref 0–35)
CO2: 31 mEq/L (ref 19–32)
Chloride: 98 mEq/L (ref 96–112)
GFR, Est African American: >89 mL/min
Sodium: 138 mEq/L (ref 135–145)
Total Bilirubin: 0.3 mg/dL (ref 0.3–1.2)
Total Protein: 7.7 g/dL (ref 6.0–8.3)

## 2012-03-06 LAB — CBC WITH DIFFERENTIAL/PLATELET
Eosinophils Absolute: 0.1 10*3/uL (ref 0.0–0.7)
Lymphocytes Relative: 35 % (ref 12–46)
Lymphs Abs: 1.9 10*3/uL (ref 0.7–4.0)
Neutrophils Relative %: 59 % (ref 43–77)
Platelets: 226 10*3/uL (ref 150–400)
RBC: 4.92 MIL/uL (ref 3.87–5.11)
WBC: 5.3 10*3/uL (ref 4.0–10.5)

## 2012-03-07 LAB — T-HELPER CELL (CD4) - (RCID CLINIC ONLY): CD4 T Cell Abs: 590 uL (ref 400–2700)

## 2012-03-08 ENCOUNTER — Other Ambulatory Visit: Payer: Self-pay | Admitting: *Deleted

## 2012-03-08 DIAGNOSIS — E785 Hyperlipidemia, unspecified: Secondary | ICD-10-CM

## 2012-03-08 DIAGNOSIS — E538 Deficiency of other specified B group vitamins: Secondary | ICD-10-CM

## 2012-03-08 DIAGNOSIS — B20 Human immunodeficiency virus [HIV] disease: Secondary | ICD-10-CM

## 2012-03-08 DIAGNOSIS — G43909 Migraine, unspecified, not intractable, without status migrainosus: Secondary | ICD-10-CM

## 2012-03-08 DIAGNOSIS — R635 Abnormal weight gain: Secondary | ICD-10-CM

## 2012-03-08 DIAGNOSIS — F3289 Other specified depressive episodes: Secondary | ICD-10-CM

## 2012-03-08 DIAGNOSIS — F329 Major depressive disorder, single episode, unspecified: Secondary | ICD-10-CM

## 2012-03-08 MED ORDER — PROMETHAZINE HCL 50 MG PO TABS: 25.0000 mg | ORAL_TABLET | Freq: Four times a day (QID) | ORAL | Status: DC | PRN

## 2012-03-19 ENCOUNTER — Ambulatory Visit: Payer: Medicaid Other | Admitting: Infectious Disease

## 2012-03-20 ENCOUNTER — Encounter: Payer: Self-pay | Admitting: Internal Medicine

## 2012-03-20 ENCOUNTER — Ambulatory Visit (INDEPENDENT_AMBULATORY_CARE_PROVIDER_SITE_OTHER): Payer: Medicaid Other | Admitting: Internal Medicine

## 2012-03-20 VITALS — BP 113/80 | HR 105 | Temp 96.6°F | Ht 65.0 in | Wt 230.0 lb

## 2012-03-20 DIAGNOSIS — R635 Abnormal weight gain: Secondary | ICD-10-CM

## 2012-03-20 DIAGNOSIS — G894 Chronic pain syndrome: Secondary | ICD-10-CM

## 2012-03-20 DIAGNOSIS — Z23 Encounter for immunization: Secondary | ICD-10-CM

## 2012-03-20 DIAGNOSIS — E538 Deficiency of other specified B group vitamins: Secondary | ICD-10-CM

## 2012-03-20 DIAGNOSIS — F419 Anxiety disorder, unspecified: Secondary | ICD-10-CM

## 2012-03-20 DIAGNOSIS — C8589 Other specified types of non-Hodgkin lymphoma, extranodal and solid organ sites: Secondary | ICD-10-CM

## 2012-03-20 DIAGNOSIS — F411 Generalized anxiety disorder: Secondary | ICD-10-CM

## 2012-03-20 DIAGNOSIS — B20 Human immunodeficiency virus [HIV] disease: Secondary | ICD-10-CM

## 2012-03-20 DIAGNOSIS — M25569 Pain in unspecified knee: Secondary | ICD-10-CM

## 2012-03-20 DIAGNOSIS — M25561 Pain in right knee: Secondary | ICD-10-CM

## 2012-03-20 DIAGNOSIS — C8590 Non-Hodgkin lymphoma, unspecified, unspecified site: Secondary | ICD-10-CM

## 2012-03-20 MED ORDER — CYANOCOBALAMIN 1000 MCG/ML IJ SOLN
1000.0000 ug | Freq: Once | INTRAMUSCULAR | Status: AC
  Administered 2012-03-20: 1000 ug via INTRAMUSCULAR

## 2012-03-20 MED ORDER — OXYCODONE-ACETAMINOPHEN 5-325 MG PO TABS: 1.0000 | ORAL_TABLET | Freq: Four times a day (QID) | ORAL | Status: DC | PRN

## 2012-03-20 MED ORDER — CYANOCOBALAMIN 1000 MCG/ML IJ SOLN: 1000.0000 ug | Freq: Once | INTRAMUSCULAR | Status: DC

## 2012-03-20 MED ORDER — ALPRAZOLAM 2 MG PO TABS: 2.0000 mg | ORAL_TABLET | Freq: Three times a day (TID) | ORAL | Status: DC | PRN

## 2012-03-20 NOTE — Assessment & Plan Note (Signed)
Stable. Adherent to current medication. Follows up with Dr. Algis Liming in ID clinic.  -Continue to followup with Dr. Algis Liming -Continue current antiretroviral medication -Continue to monitor

## 2012-03-20 NOTE — Assessment & Plan Note (Signed)
Has lost 9 pounds since last visit. Advised to continue healthy weight loss, diet modification, exercise as tolerated

## 2012-03-20 NOTE — Assessment & Plan Note (Signed)
Pain contract renewed her Percocet today.  Continues to be inconsistent on medication refills and wanting pain medication. Was taken off soma on last visit and discontinued trazodone this visit.  She wishes to go back on soma or try another muscle relaxant. Will defer the decision to try muscle relaxants or any other pain medication to sports medicine and or pain management at this time.  -Continue Percocet, prescription for her November given today -Return to clinic in November for followup appointment -Follow up with sports medicine -Continue to taper off multiple sedating medications despite her claiming to have no sedation or side effects
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
## 2012-03-20 NOTE — Assessment & Plan Note (Addendum)
Follows up with Dr. Jonny Ruiz
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
## 2012-03-20 NOTE — Patient Instructions (Addendum)
Please follow up with Sports medicine for your appointment tomorrow and follow up with physical therapy.  Discuss with Dr. Margaretha Sheffield in regards to muscle relaxant for knee.   Please return to clinic for follow up appointment in 2 months--November  Percocet for November signed out today and Xanax given until November which we will start tapering as discussed  Consider going to [facility] for anxiety issues as discussed   F/u with ENT and GI appointments as you requested  We discussed possible sedation with your medication. Please take necessary precautions, avoid driving while on medication.   Continue to follow up with Dr. Daiva Eves in ID clinic  Get B12 level checked with Dr. Daiva Eves or in clinic next visit, not shown to be low in prior testing.

## 2012-03-20 NOTE — Assessment & Plan Note (Signed)
B12 injection given again this visit. Will need to recheck B12 levels next visit. [name] claims she was told by her infectious disease doctors that HIV medications decreased B12 levels and she has been receiving shots since then, they also make her feel like she has more energy. She insists on taking B12 monthly. We discussed for her to take oral B12 over-the-counter however she claims the oral medications apparently interact with her HIV medications. We will need to look into this further and/or discuss with her infectious disease doctors if needed.  -Recheck B12 visit next visit

## 2012-03-20 NOTE — Assessment & Plan Note (Signed)
Followup sports medicine appointment tomorrow X-rays reveal possible meniscus tear, steroid injection given on last sports medicine visit Still asking for muscle relaxant and additional pain medication since, has been discontinued. We will continue to defer her need for muscle relaxant and/or any other pain medication at this time to sports medicine and or pain management.  -Followup sports medicine -Continue to monitor

## 2012-03-20 NOTE — Progress Notes (Signed)
COPY OF PAIN CONTRACT GIVEN TO PT.

## 2012-03-21 ENCOUNTER — Ambulatory Visit (INDEPENDENT_AMBULATORY_CARE_PROVIDER_SITE_OTHER): Payer: Self-pay | Admitting: Sports Medicine

## 2012-03-21 ENCOUNTER — Encounter: Payer: Self-pay | Admitting: Internal Medicine

## 2012-03-21 VITALS — BP 119/79 | Ht 66.0 in | Wt 230.0 lb

## 2012-03-21 DIAGNOSIS — M25561 Pain in right knee: Secondary | ICD-10-CM

## 2012-03-21 DIAGNOSIS — M25569 Pain in unspecified knee: Secondary | ICD-10-CM

## 2012-03-21 MED ORDER — METHOCARBAMOL 750 MG PO TABS: 750.0000 mg | ORAL_TABLET | Freq: Two times a day (BID) | ORAL | Status: DC
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
## 2012-03-21 NOTE — Patient Instructions (Addendum)
MRI R KNEE FRI SEPT 20TH 9PM [facility] [address] [phone]

## 2012-03-22 ENCOUNTER — Ambulatory Visit
Admission: RE | Admit: 2012-03-22 | Discharge: 2012-03-22 | Disposition: A | Discharge status: Home / Self Care | Payer: Medicaid Other | Source: Ambulatory Visit | Attending: Infectious Disease | Admitting: Infectious Disease

## 2012-03-22 ENCOUNTER — Ambulatory Visit (INDEPENDENT_AMBULATORY_CARE_PROVIDER_SITE_OTHER): Payer: Medicaid Other | Admitting: Infectious Disease

## 2012-03-22 ENCOUNTER — Other Ambulatory Visit: Payer: Self-pay | Admitting: *Deleted

## 2012-03-22 ENCOUNTER — Encounter: Payer: Self-pay | Admitting: Infectious Disease

## 2012-03-22 VITALS — BP 116/80 | HR 83 | Temp 98.0°F | Wt 230.0 lb

## 2012-03-22 DIAGNOSIS — J329 Chronic sinusitis, unspecified: Secondary | ICD-10-CM

## 2012-03-22 DIAGNOSIS — G2581 Restless legs syndrome: Secondary | ICD-10-CM

## 2012-03-22 DIAGNOSIS — B9689 Other specified bacterial agents as the cause of diseases classified elsewhere: Secondary | ICD-10-CM | POA: Insufficient documentation

## 2012-03-22 DIAGNOSIS — B20 Human immunodeficiency virus [HIV] disease: Secondary | ICD-10-CM

## 2012-03-22 DIAGNOSIS — A499 Bacterial infection, unspecified: Secondary | ICD-10-CM

## 2012-03-22 DIAGNOSIS — J40 Bronchitis, not specified as acute or chronic: Secondary | ICD-10-CM

## 2012-03-22 MED ORDER — ALBUTEROL SULFATE HFA 108 (90 BASE) MCG/ACT IN AERS: 2.0000 | INHALATION_SPRAY | Freq: Four times a day (QID) | RESPIRATORY_TRACT | Status: DC | PRN

## 2012-03-22 MED ORDER — PRAMIPEXOLE DIHYDROCHLORIDE 0.25 MG PO TABS: 0.2500 mg | ORAL_TABLET | Freq: Every day | ORAL | Status: DC

## 2012-03-22 MED ORDER — AZITHROMYCIN 250 MG PO TABS: ORAL_TABLET | ORAL | Status: DC

## 2012-03-22 NOTE — Assessment & Plan Note (Signed)
All treat her for possible bacterial sinusitis with azithromycin pack.

## 2012-03-22 NOTE — Assessment & Plan Note (Signed)
Perfect control followup in 6 months time.
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
## 2012-03-22 NOTE — Assessment & Plan Note (Signed)
I don't know that she carries a formal diagnosis of COPD or asthma bowel give her some albuterol which she has had in the past.

## 2012-03-23 ENCOUNTER — Ambulatory Visit
Admission: RE | Admit: 2012-03-23 | Discharge: 2012-03-23 | Disposition: A | Discharge status: Home / Self Care | Payer: No Typology Code available for payment source | Source: Ambulatory Visit | Attending: Sports Medicine | Admitting: Sports Medicine

## 2012-03-23 DIAGNOSIS — M25561 Pain in right knee: Secondary | ICD-10-CM

## 2012-03-23 NOTE — Progress Notes (Signed)
I saw, examined, and discussed the patient with Dr Virgina Organ and agree with the note contained here. I agree with Dr Waynard Reeds plan to attempt to decrease the number and quantity of sedating meds. This will likely be a challenging process. Reviewed Dr Burley Saver notes and he saw her several times for "chronic bronchitis", URI's, ear pain and was always prescribed an antibiotic. She also has never had a documented low B 12 level nor a documented MMA level. I wonder about the accuracy of her B12 def dx and the need for IM B12. However, I would focus on decreasing sedating meds as there is a greater risk there than with B12.

## 2012-03-26 ENCOUNTER — Telehealth: Payer: Self-pay | Admitting: Sports Medicine
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
## 2012-03-28 ENCOUNTER — Telehealth: Payer: Self-pay | Admitting: *Deleted
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
## 2012-03-28 NOTE — Telephone Encounter (Signed)
Scheduled pt for appt with Dr. Dion Saucier 04/04/12 @ 9am.  Pt will be required to pay $250 up front if she is self pay, will advise pt of this.

## 2012-04-03 ENCOUNTER — Telehealth: Payer: Self-pay | Admitting: *Deleted

## 2012-04-03 ENCOUNTER — Encounter: Payer: Self-pay | Admitting: Internal Medicine

## 2012-04-03 NOTE — Telephone Encounter (Signed)
Hello Glenda,  She has not been seen by both offices for some time now and I believe will need a new referral after being evaluated in clinic first and offered treatment.  She will likely need to come in to clinic to be seen first.

## 2012-04-03 NOTE — Telephone Encounter (Signed)
I agree with Dr Virgina Organ. She cannot refer a pt without first evaluating the pt for the complaint and treating it ourselves. Only when we have exhausted our options, do we refer. We did review her ENT visits and they ALWAYS resulted in a broad spectrum ABX being prescribed for "chronic bronchitis", URI, etc. Two days after her last visit with Dr Jannet Mantis, when she did not have bacterial sinusitis, she saw ID and got an ABX.   If she has an acute issue that needs addressed, she needs an appt. It would be best to see Dr Virgina Organ but I doubt she has any openings soon so likely will need [facility] res.

## 2012-04-03 NOTE — Telephone Encounter (Signed)
Pt stated she had talked to the doctor about these referrals when she came in 9/19.

## 2012-04-03 NOTE — Telephone Encounter (Signed)
Call from pt requesting referrals: ENT referral to see Dr Lazarus Salines for ears/sinuses problems and GI referral with  GI For f/u and c/o constipation. Can u order these referrals?  Thanks

## 2012-04-03 NOTE — Telephone Encounter (Signed)
Dr Rogelia Boga and I reviewed her prior records and she had not been seen by specialists for some time.  Last treatment I believe was noted to be with antibiotics for sinus infection.  Does she not wish to be seen/evaluated in clinic for her complaints? I do not see a problem why she couldn't be evaluated in clinic? Perhaps, Dr. Rogelia Boga can offer more insight if we do straight referrals to specialists? My assumption would be to offer to treat if needed first by our clinic and then refer unless we see need for specialist referral.  However, given that she has been seen in the past this might be different and we can investigate further. Any input Dr. Rogelia Boga?

## 2012-04-04 NOTE — Telephone Encounter (Signed)
I placed that on the AVS I believe because she mentioned them to me before she left thinking that I would need to follow up on the information and records and if appropriate would refer. My initial understanding was that if she has been seen by them before that she could be seen by them again on her own without another specific referral from [place], but I was not sure about the procedure so I wanted to double check with Dr. Rogelia Boga which I did.  After reviewing records after her visit, her appointments with both specialists were dated pretty far in the past and usually treated with antibiotics.    I can try to call and speak to [name] tomorrow and explain instead of [name] having to speak to her.  If she would like to be seen she can come to the clinic and be evaluated and we can also try to handle her prescriptions during that visit as well and then she would not need to come back the next month. Or, if she does not feel like this is emergent she can wait till her scheduled appointment to be seen for everything at one time that is fine as well.  Based on what I have reviewed, I am not sure why she was referred to specialists in the past. Perhaps there is some information I am missing?

## 2012-04-04 NOTE — Telephone Encounter (Signed)
Talked to pt about scheduling another appt to discuss referrals; pt stated she talked to Dr Virgina Organ about the referrals. Stated she had mentioned them on the check out instructions( AVS ) -" to f/u ENT and GI appointments'. Stated she needs new referrals since it has been about 3 yrs since she seen these doctors. Pt becoming upset on the reason why she needs to come back in and see a different doctor. And then come back in 2 months for refills.

## 2012-04-04 NOTE — Telephone Encounter (Signed)
Dr Virgina Organ, I checked the AVS and you did have this statement "F/u with ENT and GI appointments as you requested". I know you didn't mean that you would make the referrals??

## 2012-04-05 ENCOUNTER — Telehealth: Payer: Self-pay | Admitting: Internal Medicine

## 2012-04-05 NOTE — Telephone Encounter (Signed)
Telephone Call Addendum;  Returned her calls today spoke to her for approximately 30.  She claims to want the referrals with D[facility] for GI issues (chronic acid reflex and constipation) and Dr. Carlynn Spry for ENT evaluation and she feels comfortable with them and has had success with them in the past. She says in the past she just feels like the specialists handle these cases and problems better in a more specialized way.  But she said that if her ears and heartburn continually bother her she will come to clinic to get evaluated.  I have explained to her a number of times today and before that if she feels this is acute and severe to call the clinic and make an appointment to be seen by one of the [facility] doctors, but if she feels like this is something chronic, which is what she kind of feels like this may be and that she just wants to touch base with the specialists in case she has an acute flare she can be seen by them properly, she can just keep her regular November appointment and be evaluated at that time.  If deemed appropriate in November visit, opc resident can decide whether referral is needed or not.  She is in agreement and says she will likely just wait until November. Again, I let her know that if she would like her issues to be addressed acutely, to please call the clinic and make an appointment to be seen as soon as possible if that is what she wishes to do.    Of note, she was given z-pak by Dr. Daiva Eves on 03/22/12 along with a clear cxr and claims to be feeling better. Also, She is apparently going to be scheduled for knee surgery soon, she is claiming to be anxious about that and is concerned about her pain medications and xanax. I have listened to her and try to ease her worries as much as possible and have explained to her that her contract stands in the clinic and we will continue to handle her medications in an appropriate manner.  She is in agreement.

## 2012-04-11 ENCOUNTER — Telehealth: Payer: Self-pay | Admitting: *Deleted

## 2012-04-11 NOTE — Telephone Encounter (Signed)
Patient calls.  She is having knee surgery (repair of torn meniscus).  States her orthopedic,  Dr. Teryl Lucy, wants a release from Dr.Granfortuna.  Requested that she have them fax [place] a request to our direct fax number  [phone]. She will call them and ask them to do this.

## 2012-04-11 NOTE — Telephone Encounter (Signed)
Please disregard previous note, I found the form and saw that Dr. Daiva Eves said she needs clearance from her PCP. [name]

## 2012-04-11 NOTE — Telephone Encounter (Signed)
[name] I DID RECEIVE THIS LETTER AND I GAVE LETTER TO EITHER TAMIKA, TRAVIS OR YOURSELF. THE LETTER NEEDS TO BE FILLED OUT BY THE PCP IN INTERNAL MEDICINE AS I AM NOT HER MEDICAL DOCTOR AND THIS IS A MEDICAL CLEARANCE INCLUDING TIMING OF STOPPING HER COUMADIN BRIDING HER WITH LOVENOX ETC

## 2012-04-11 NOTE — Telephone Encounter (Signed)
OK - thanks

## 2012-04-11 NOTE — Telephone Encounter (Signed)
Patient called requesting a letter from Dr. Daiva Eves clearing her for knee Sx.  Was told something was faxed over from Dr. Osie Bond and they have not received it back. Wendall Mola CMA

## 2012-04-13 ENCOUNTER — Encounter: Payer: Self-pay | Admitting: Licensed Clinical Social Worker

## 2012-04-13 ENCOUNTER — Encounter: Payer: Self-pay | Admitting: Infectious Disease

## 2012-04-13 ENCOUNTER — Telehealth: Payer: Self-pay | Admitting: *Deleted

## 2012-04-13 NOTE — Telephone Encounter (Signed)
Faxed for to [facility] [phone] regarding release for knee surgery

## 2012-04-17 ENCOUNTER — Encounter (HOSPITAL_BASED_OUTPATIENT_CLINIC_OR_DEPARTMENT_OTHER): Payer: Self-pay | Admitting: *Deleted

## 2012-04-17 ENCOUNTER — Telehealth: Payer: Self-pay | Admitting: *Deleted

## 2012-04-17 NOTE — Telephone Encounter (Signed)
Prior Authorization for Nexium 20 mg Capsules # 60.  1 po twice daily.  Authorization # [id number] starting 04/17/2012 thru 04/17/2013.  Call to [facility]-given Prior Authorization.  Pt has been on Prilosec, Protonix, Zantac and Carafate and failed treatment.  Angelina Ok, RN 04/17/2012 2:26 PM.

## 2012-04-17 NOTE — Progress Notes (Signed)
No labs needed

## 2012-04-20 ENCOUNTER — Encounter (HOSPITAL_BASED_OUTPATIENT_CLINIC_OR_DEPARTMENT_OTHER): Payer: Self-pay | Admitting: *Deleted

## 2012-04-20 ENCOUNTER — Encounter (HOSPITAL_BASED_OUTPATIENT_CLINIC_OR_DEPARTMENT_OTHER): Payer: Self-pay | Admitting: Anesthesiology

## 2012-04-20 ENCOUNTER — Ambulatory Visit (HOSPITAL_BASED_OUTPATIENT_CLINIC_OR_DEPARTMENT_OTHER): Payer: No Typology Code available for payment source | Admitting: Anesthesiology

## 2012-04-20 ENCOUNTER — Ambulatory Visit (HOSPITAL_BASED_OUTPATIENT_CLINIC_OR_DEPARTMENT_OTHER)
Admission: RE | Admit: 2012-04-20 | Discharge: 2012-04-20 | Disposition: A | Discharge status: Home / Self Care | Payer: No Typology Code available for payment source | Source: Ambulatory Visit | Attending: Orthopedic Surgery | Admitting: Orthopedic Surgery

## 2012-04-20 ENCOUNTER — Encounter (HOSPITAL_BASED_OUTPATIENT_CLINIC_OR_DEPARTMENT_OTHER): Payer: Self-pay | Admitting: Orthopedic Surgery

## 2012-04-20 ENCOUNTER — Encounter (HOSPITAL_BASED_OUTPATIENT_CLINIC_OR_DEPARTMENT_OTHER)
Admission: RE | Disposition: A | Discharge status: Home / Self Care | Payer: Self-pay | Source: Ambulatory Visit | Attending: Orthopedic Surgery

## 2012-04-20 DIAGNOSIS — S83281A Other tear of lateral meniscus, current injury, right knee, initial encounter: Secondary | ICD-10-CM

## 2012-04-20 DIAGNOSIS — Z21 Asymptomatic human immunodeficiency virus [HIV] infection status: Secondary | ICD-10-CM | POA: Insufficient documentation

## 2012-04-20 DIAGNOSIS — M94261 Chondromalacia, right knee: Secondary | ICD-10-CM

## 2012-04-20 DIAGNOSIS — S83289A Other tear of lateral meniscus, current injury, unspecified knee, initial encounter: Secondary | ICD-10-CM | POA: Insufficient documentation

## 2012-04-20 DIAGNOSIS — M2240 Chondromalacia patellae, unspecified knee: Secondary | ICD-10-CM | POA: Insufficient documentation

## 2012-04-20 DIAGNOSIS — J4489 Other specified chronic obstructive pulmonary disease: Secondary | ICD-10-CM | POA: Insufficient documentation

## 2012-04-20 DIAGNOSIS — J449 Chronic obstructive pulmonary disease, unspecified: Secondary | ICD-10-CM | POA: Insufficient documentation

## 2012-04-20 HISTORY — DX: Other tear of lateral meniscus, current injury, right knee, initial encounter: S83.281A

## 2012-04-20 HISTORY — DX: Acute embolism and thrombosis of unspecified deep veins of unspecified lower extremity: I82.409

## 2012-04-20 HISTORY — PX: KNEE ARTHROSCOPY: SHX127

## 2012-04-20 HISTORY — DX: Chondromalacia, right knee: M94.261

## 2012-04-20 SURGERY — ARTHROSCOPY, KNEE, WITH LATERAL MENISCECTOMY
Anesthesia: General | Site: Knee | Laterality: Right | Wound class: Clean

## 2012-04-20 MED ORDER — LIDOCAINE HCL (CARDIAC) 10 MG/ML IV SOLN
INTRAVENOUS | Status: DC | PRN
  Administered 2012-04-20: 25 mg via INTRAVENOUS

## 2012-04-20 MED ORDER — HYDROMORPHONE HCL PF 1 MG/ML IJ SOLN
0.2500 mg | INTRAMUSCULAR | Status: DC | PRN
  Administered 2012-04-20 (×2): 0.5 mg via INTRAVENOUS

## 2012-04-20 MED ORDER — SODIUM CHLORIDE 0.9 % IR SOLN
Status: DC | PRN
  Administered 2012-04-20: 6000 mL

## 2012-04-20 MED ORDER — OXYCODONE HCL 5 MG PO TABS: ORAL_TABLET | ORAL | Status: DC

## 2012-04-20 MED ORDER — SUCCINYLCHOLINE CHLORIDE 20 MG/ML IJ SOLN
INTRAMUSCULAR | Status: DC | PRN
  Administered 2012-04-20: 100 mg via INTRAVENOUS

## 2012-04-20 MED ORDER — PROMETHAZINE HCL 25 MG/ML IJ SOLN: 6.2500 mg | INTRAMUSCULAR | Status: DC | PRN

## 2012-04-20 MED ORDER — HYDROMORPHONE HCL 2 MG PO TABS: 2.0000 mg | ORAL_TABLET | ORAL | Status: DC | PRN

## 2012-04-20 MED ORDER — CEFAZOLIN SODIUM-DEXTROSE 2-3 GM-% IV SOLR
2.0000 g | INTRAVENOUS | Status: AC
  Administered 2012-04-20: 2 g via INTRAVENOUS

## 2012-04-20 MED ORDER — MEPERIDINE HCL 25 MG/ML IJ SOLN: 6.2500 mg | INTRAMUSCULAR | Status: DC | PRN

## 2012-04-20 MED ORDER — LACTATED RINGERS IV SOLN
INTRAVENOUS | Status: DC
  Administered 2012-04-20 (×3): via INTRAVENOUS

## 2012-04-20 MED ORDER — PROPOFOL 10 MG/ML IV BOLUS
INTRAVENOUS | Status: DC | PRN
  Administered 2012-04-20: 200 mg via INTRAVENOUS

## 2012-04-20 MED ORDER — ENOXAPARIN SODIUM 40 MG/0.4ML ~~LOC~~ SOLN: 40.0000 mg | SUBCUTANEOUS | Status: DC

## 2012-04-20 MED ORDER — DEXAMETHASONE SODIUM PHOSPHATE 4 MG/ML IJ SOLN
INTRAMUSCULAR | Status: DC | PRN
  Administered 2012-04-20: 10 mg via INTRAVENOUS

## 2012-04-20 MED ORDER — MIDAZOLAM HCL 2 MG/2ML IJ SOLN: 0.5000 mg | Freq: Once | INTRAMUSCULAR | Status: DC | PRN

## 2012-04-20 MED ORDER — OXYCODONE HCL 5 MG PO TABS: 5.0000 mg | ORAL_TABLET | Freq: Once | ORAL | Status: DC | PRN

## 2012-04-20 MED ORDER — OXYCODONE HCL 5 MG/5ML PO SOLN: 5.0000 mg | Freq: Once | ORAL | Status: DC | PRN

## 2012-04-20 MED ORDER — BUPIVACAINE HCL (PF) 0.5 % IJ SOLN
INTRAMUSCULAR | Status: DC | PRN
  Administered 2012-04-20: 20 mL via INTRA_ARTICULAR

## 2012-04-20 MED ORDER — FENTANYL CITRATE 0.05 MG/ML IJ SOLN
INTRAMUSCULAR | Status: DC | PRN
  Administered 2012-04-20 (×2): 25 ug via INTRAVENOUS
  Administered 2012-04-20: 100 ug via INTRAVENOUS

## 2012-04-20 SURGICAL SUPPLY — 47 items
BANDAGE ELASTIC 6 VELCRO ST LF (GAUZE/BANDAGES/DRESSINGS) ×3 IMPLANT
BANDAGE ESMARK 6X9 LF (GAUZE/BANDAGES/DRESSINGS) IMPLANT
BENZOIN TINCTURE PRP APPL 2/3 (GAUZE/BANDAGES/DRESSINGS) ×3 IMPLANT
BLADE CUDA 5.5 (BLADE) IMPLANT
BLADE CUDA GRT WHITE 3.5 (BLADE) IMPLANT
BLADE CUDA SHAVER 3.5 (BLADE) IMPLANT
BLADE CUTTER GATOR 3.5 (BLADE) ×3 IMPLANT
BLADE CUTTER MENIS 5.5 (BLADE) IMPLANT
BLADE GREAT WHITE 4.2 (BLADE) IMPLANT
BNDG ESMARK 6X9 LF (GAUZE/BANDAGES/DRESSINGS)
CANISTER OMNI JUG 16 LITER (MISCELLANEOUS) IMPLANT
CANISTER SUCTION 2500CC (MISCELLANEOUS) IMPLANT
CLOTH BEACON ORANGE TIMEOUT ST (SAFETY) ×3 IMPLANT
CUFF TOURNIQUET SINGLE 34IN LL (TOURNIQUET CUFF) IMPLANT
CUTTER KNOT PUSHER 2-0 FIBERWI (INSTRUMENTS) IMPLANT
CUTTER MENISCUS  4.2MM (BLADE)
CUTTER MENISCUS 4.2MM (BLADE) IMPLANT
DRAPE ARTHROSCOPY W/POUCH 90 (DRAPES) ×3 IMPLANT
DURAPREP 26ML APPLICATOR (WOUND CARE) ×3 IMPLANT
ELECT MENISCUS 165MM 90D (ELECTRODE) IMPLANT
ELECT REM PT RETURN 9FT ADLT (ELECTROSURGICAL)
ELECTRODE REM PT RTRN 9FT ADLT (ELECTROSURGICAL) IMPLANT
GLOVE BIO SURGEON STRL SZ 6.5 (GLOVE) ×3 IMPLANT
GLOVE BIO SURGEON STRL SZ8 (GLOVE) ×6 IMPLANT
GLOVE BIOGEL PI IND STRL 7.0 (GLOVE) ×2 IMPLANT
GLOVE BIOGEL PI IND STRL 8 (GLOVE) ×4 IMPLANT
GLOVE BIOGEL PI INDICATOR 7.0 (GLOVE) ×1
GLOVE BIOGEL PI INDICATOR 8 (GLOVE) ×2
GLOVE ORTHO TXT STRL SZ7.5 (GLOVE) ×3 IMPLANT
GOWN BRE IMP PREV XXLGXLNG (GOWN DISPOSABLE) ×6 IMPLANT
HOLDER KNEE FOAM BLUE (MISCELLANEOUS) ×3 IMPLANT
KNEE WRAP E Z 3 GEL PACK (MISCELLANEOUS) ×3 IMPLANT
NEEDLE MENISCAL REPAIR DBL ARM (NEEDLE) IMPLANT
NEEDLE MENISCAL REPAIR W/EYELT (NEEDLE) IMPLANT
PACK ARTHROSCOPY DSU (CUSTOM PROCEDURE TRAY) ×3 IMPLANT
PACK BASIN DAY SURGERY FS (CUSTOM PROCEDURE TRAY) ×3 IMPLANT
PENCIL BUTTON HOLSTER BLD 10FT (ELECTRODE) IMPLANT
SET ARTHROSCOPY TUBING (MISCELLANEOUS) ×1
SET ARTHROSCOPY TUBING LN (MISCELLANEOUS) ×2 IMPLANT
SLEEVE SCD COMPRESS KNEE MED (MISCELLANEOUS) ×3 IMPLANT
SPONGE GAUZE 4X4 12PLY (GAUZE/BANDAGES/DRESSINGS) ×3 IMPLANT
STRIP CLOSURE SKIN 1/2X4 (GAUZE/BANDAGES/DRESSINGS) ×3 IMPLANT
SUT MNCRL AB 4-0 PS2 18 (SUTURE) ×3 IMPLANT
TOWEL OR 17X24 6PK STRL BLUE (TOWEL DISPOSABLE) ×3 IMPLANT
TOWEL OR NON WOVEN STRL DISP B (DISPOSABLE) IMPLANT
WAND STAR VAC 90 (SURGICAL WAND) IMPLANT
WATER STERILE IRR 1000ML POUR (IV SOLUTION) ×3 IMPLANT
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
## 2012-04-20 NOTE — Anesthesia Procedure Notes (Signed)
Procedure Name: Intubation Date/Time: 04/20/2012 12:02 PM Performed by: Gar Gibbon Pre-anesthesia Checklist: Patient identified, Emergency Drugs available, Suction available and Patient being monitored Patient Re-evaluated:Patient Re-evaluated prior to inductionOxygen Delivery Method: Circle System Utilized Preoxygenation: Pre-oxygenation with 100% oxygen Intubation Type: IV induction Ventilation: Mask ventilation without difficulty Laryngoscope Size: Mac and 3 Grade View: Grade II Tube type: Oral Number of attempts: 1 Airway Equipment and Method: stylet and oral airway Placement Confirmation: ETT inserted through vocal cords under direct vision,  positive ETCO2 and breath sounds checked- equal and bilateral Secured at: 21 cm Tube secured with: Tape Dental Injury: Teeth and Oropharynx as per pre-operative assessment

## 2012-04-20 NOTE — Anesthesia Postprocedure Evaluation (Signed)
Anesthesia Post Note  Patient: [name]  Procedure(s) Performed: Procedure(s) (LRB): KNEE ARTHROSCOPY WITH LATERAL MENISECTOMY (Right)  Anesthesia type: General  Patient location: PACU  Post pain: Pain level controlled and Adequate analgesia  Post assessment: Post-op Vital signs reviewed, Patient's Cardiovascular Status Stable, Respiratory Function Stable, Patent Airway and Pain level controlled  Last Vitals:  Filed Vitals:   04/20/12 1330  BP: 90/42  Pulse: 73  Temp:   Resp: 17    Post vital signs: Reviewed and stable  Level of consciousness: awake, alert  and oriented  Complications: No apparent anesthesia complications

## 2012-04-20 NOTE — Transfer of Care (Signed)
Immediate Anesthesia Transfer of Care Note  Patient: [name]  Procedure(s) Performed: Procedure(s) (LRB) with comments: KNEE ARTHROSCOPY WITH LATERAL MENISECTOMY (Right) - Right Knee Arthroxcopy with Partial Lateral Menisectomy and Chondroplasty of Medial Femor Condyle  Patient Location: PACU  Anesthesia Type: General  Level of Consciousness: sedated  Airway & Oxygen Therapy: Patient Spontanous Breathing and Patient connected to face mask oxygen  Post-op Assessment: Report given to PACU RN and Post -op Vital signs reviewed and stable  Post vital signs: Reviewed and stable  Complications: No apparent anesthesia complications

## 2012-04-20 NOTE — Anesthesia Preprocedure Evaluation (Addendum)
Anesthesia Evaluation  Patient identified by MRN, date of birth, ID band Patient awake    Reviewed: Allergy & Precautions, H&P , NPO status , Patient's Chart, lab work & pertinent test results  History of Anesthesia Complications Negative for: history of anesthetic complications  Airway Mallampati: I TM Distance: >3 FB Neck ROM: Full    Dental  (+) Teeth Intact and Dental Advisory Given   Pulmonary asthma , COPD COPD inhaler,  breath sounds clear to auscultation  Pulmonary exam normal       Cardiovascular negative cardio ROS  Rhythm:Regular Rate:Normal  Stress test 6/12: antero-septal attenuation artifact, EF 54%, no ischemia   Neuro/Psych  Headaches, Seizures -,  PSYCHIATRIC DISORDERS Anxiety Depression  Neuromuscular disease (peripheral neuropathy)    GI/Hepatic Neg liver ROS, PUD, GERD-  Medicated and Poorly Controlled,  Endo/Other  Morbid obesity  Renal/GU negative Renal ROS     Musculoskeletal   Abdominal (+) + obese,   Peds  Hematology   Anesthesia Other Findings HIV Non-Hodgkin's lymphoma: chemo, XRT  Reproductive/Obstetrics Novasure                          Anesthesia Physical Anesthesia Plan  ASA: III  Anesthesia Plan: General   Post-op Pain Management:    Induction: Intravenous  Airway Management Planned: Oral ETT  Additional Equipment:   Intra-op Plan:   Post-operative Plan: Extubation in OR  Informed Consent: I have reviewed the patients History and Physical, chart, labs and discussed the procedure including the risks, benefits and alternatives for the proposed anesthesia with the patient or authorized representative who has indicated his/her understanding and acceptance.   Dental advisory given  Plan Discussed with: CRNA and Surgeon  Anesthesia Plan Comments: (Plan routine monitors, GETA Patient prefers NO ZOFRAN (triggers migraine))        Anesthesia  Quick Evaluation
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
## 2012-04-23 ENCOUNTER — Telehealth: Payer: Self-pay | Admitting: *Deleted

## 2012-04-23 NOTE — Telephone Encounter (Signed)
Pt states she had surg Friday  And the surgeon has given her hydromorphone for pain, she states she may need more and the surgeon had mentioned to her her pcp resuming prescribing the hydromorphone after the first script is finished, it is suggested that she would need an appt and possibly for now her ortho surg needs to manage her pain. She states he probably will but she just didn't want to violate her pain contract. She will f/u w/ the surg.

## 2012-04-23 NOTE — Telephone Encounter (Signed)
I saw the procedure note from her surgery for the extra medication she was given above her baseline.  I will not be giving her extra medicine above what we give under her pain contract regularly.  I also am not sure if she does indeed need more medicine other than what the physician already gave her s/p surgery.  She has a strong history of polypharmacy and we have been actively trying to titrate the medications as much as possible.  I agree with recommending her to follow up with surgery if pain continues and if she feels she needs extra medication.

## 2012-05-03 ENCOUNTER — Encounter (INDEPENDENT_AMBULATORY_CARE_PROVIDER_SITE_OTHER): Payer: Medicaid Other

## 2012-05-03 ENCOUNTER — Other Ambulatory Visit: Payer: Self-pay | Admitting: Cardiology

## 2012-05-03 DIAGNOSIS — M7989 Other specified soft tissue disorders: Secondary | ICD-10-CM

## 2012-05-03 DIAGNOSIS — M79609 Pain in unspecified limb: Secondary | ICD-10-CM

## 2012-05-15 ENCOUNTER — Other Ambulatory Visit: Payer: Self-pay | Admitting: *Deleted

## 2012-05-15 ENCOUNTER — Encounter: Payer: Self-pay | Admitting: Internal Medicine

## 2012-05-15 ENCOUNTER — Ambulatory Visit (INDEPENDENT_AMBULATORY_CARE_PROVIDER_SITE_OTHER): Payer: Medicaid Other | Admitting: Internal Medicine

## 2012-05-15 ENCOUNTER — Ambulatory Visit: Payer: Self-pay | Admitting: Internal Medicine

## 2012-05-15 VITALS — BP 104/74 | HR 89 | Temp 97.2°F | Resp 20 | Ht 65.5 in | Wt 234.7 lb

## 2012-05-15 DIAGNOSIS — B3749 Other urogenital candidiasis: Secondary | ICD-10-CM

## 2012-05-15 DIAGNOSIS — B20 Human immunodeficiency virus [HIV] disease: Secondary | ICD-10-CM

## 2012-05-15 DIAGNOSIS — F419 Anxiety disorder, unspecified: Secondary | ICD-10-CM

## 2012-05-15 DIAGNOSIS — E538 Deficiency of other specified B group vitamins: Secondary | ICD-10-CM

## 2012-05-15 DIAGNOSIS — K219 Gastro-esophageal reflux disease without esophagitis: Secondary | ICD-10-CM

## 2012-05-15 DIAGNOSIS — G43909 Migraine, unspecified, not intractable, without status migrainosus: Secondary | ICD-10-CM

## 2012-05-15 DIAGNOSIS — F411 Generalized anxiety disorder: Secondary | ICD-10-CM

## 2012-05-15 DIAGNOSIS — L299 Pruritus, unspecified: Secondary | ICD-10-CM | POA: Insufficient documentation

## 2012-05-15 DIAGNOSIS — G894 Chronic pain syndrome: Secondary | ICD-10-CM

## 2012-05-15 DIAGNOSIS — G2581 Restless legs syndrome: Secondary | ICD-10-CM

## 2012-05-15 MED ORDER — OXYCODONE-ACETAMINOPHEN 5-325 MG PO TABS: 1.0000 | ORAL_TABLET | Freq: Four times a day (QID) | ORAL | Status: DC | PRN

## 2012-05-15 MED ORDER — ALPRAZOLAM 2 MG PO TABS: 2.0000 mg | ORAL_TABLET | Freq: Three times a day (TID) | ORAL | Status: DC | PRN

## 2012-05-15 MED ORDER — CYANOCOBALAMIN 1000 MCG/ML IJ SOLN
1000.0000 ug | Freq: Once | INTRAMUSCULAR | Status: AC
  Administered 2012-05-15: 1000 ug via INTRAMUSCULAR

## 2012-05-15 MED ORDER — SCOPOLAMINE 1 MG/3DAYS TD PT72: 1.0000 | MEDICATED_PATCH | TRANSDERMAL | Status: DC | PRN

## 2012-05-15 MED ORDER — PRAMIPEXOLE DIHYDROCHLORIDE 0.5 MG PO TABS: 0.5000 mg | ORAL_TABLET | Freq: Every day | ORAL | Status: DC

## 2012-05-15 MED ORDER — TERCONAZOLE 0.8 % VA CREA: 1.0000 | TOPICAL_CREAM | Freq: Every day | VAGINAL | Status: DC

## 2012-05-15 MED ORDER — CYANOCOBALAMIN 1000 MCG/ML IJ SOLN: 1000.0000 ug | INTRAMUSCULAR | Status: DC

## 2012-05-15 MED ORDER — FLUCONAZOLE 100 MG PO TABS: 100.0000 mg | ORAL_TABLET | Freq: Every day | ORAL | Status: DC

## 2012-05-15 MED ORDER — DICLOFENAC SODIUM 1 % TD GEL: 1.0000 "application " | Freq: Two times a day (BID) | TRANSDERMAL | Status: DC | PRN

## 2012-05-15 NOTE — Assessment & Plan Note (Signed)
Continue Nexium. She reports having intermittent vomiting for past few weeks and thinks that her GERD is getting worse.  She desires appointment with GI. Referral made.

## 2012-05-15 NOTE — Assessment & Plan Note (Signed)
Refilled Percocet for month of December and months of January 2014- per patient's request who will sign papers to take the prescriptions. - Patient has a followup appointment with sports medicine.

## 2012-05-15 NOTE — Assessment & Plan Note (Signed)
Requested refills for Terazol cream and fluconazole. Refilled.

## 2012-05-15 NOTE — Assessment & Plan Note (Addendum)
Refill Xanax today. 2 mg 90 pills. Patient expressed increased stress post surgery and does not want to taper Xanax at this point of time. Will leave it on Dr. Virgina Organ to taper it slowly during subsequent visits.

## 2012-05-15 NOTE — Addendum Note (Signed)
Addended by: Neomia Dear on: 05/15/2012 03:39 PM   Modules accepted: Orders
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
## 2012-05-15 NOTE — Patient Instructions (Signed)
Please make a followup appointment with Dr. Virgina Organ in 2-3 months. Call for early appointment if needed. Meanwhile followup with gastroenterology and Dr. Lazarus Salines. Continue taking all medications regularly as you do.
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
## 2012-05-15 NOTE — Telephone Encounter (Signed)
Called to pharm 

## 2012-05-17 ENCOUNTER — Encounter: Payer: Self-pay | Admitting: *Deleted

## 2012-05-22 ENCOUNTER — Ambulatory Visit: Payer: Medicaid Other | Admitting: Gastroenterology

## 2012-05-22 ENCOUNTER — Encounter: Payer: Self-pay | Admitting: Gastroenterology

## 2012-05-25 ENCOUNTER — Other Ambulatory Visit: Payer: Self-pay | Admitting: *Deleted

## 2012-05-25 DIAGNOSIS — B20 Human immunodeficiency virus [HIV] disease: Secondary | ICD-10-CM

## 2012-05-29 MED ORDER — RITONAVIR 100 MG PO TABS: 100.0000 mg | ORAL_TABLET | Freq: Every day | ORAL | Status: DC

## 2012-05-29 MED ORDER — EMTRICITABINE-TENOFOVIR DF 200-300 MG PO TABS: 1.0000 | ORAL_TABLET | Freq: Every day | ORAL | Status: DC

## 2012-05-30 ENCOUNTER — Other Ambulatory Visit: Payer: Self-pay | Admitting: *Deleted

## 2012-05-30 DIAGNOSIS — R05 Cough: Secondary | ICD-10-CM

## 2012-05-30 MED ORDER — IPRATROPIUM-ALBUTEROL 18-103 MCG/ACT IN AERO: 2.0000 | INHALATION_SPRAY | Freq: Four times a day (QID) | RESPIRATORY_TRACT | Status: DC | PRN

## 2012-06-05 ENCOUNTER — Encounter: Payer: Self-pay | Admitting: Internal Medicine

## 2012-06-05 ENCOUNTER — Ambulatory Visit: Payer: Medicaid Other | Admitting: Sports Medicine

## 2012-06-05 ENCOUNTER — Ambulatory Visit (INDEPENDENT_AMBULATORY_CARE_PROVIDER_SITE_OTHER): Payer: Medicaid Other | Admitting: Sports Medicine

## 2012-06-05 DIAGNOSIS — M545 Low back pain, unspecified: Secondary | ICD-10-CM

## 2012-06-05 DIAGNOSIS — S134XXA Sprain of ligaments of cervical spine, initial encounter: Secondary | ICD-10-CM

## 2012-06-05 MED ORDER — AMITRIPTYLINE HCL 50 MG PO TABS: 50.0000 mg | ORAL_TABLET | Freq: Every day | ORAL | Status: DC
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
## 2012-06-05 NOTE — Assessment & Plan Note (Signed)
Chronic low back pain with history of grade 1 spondylolisthesis exacerbated now with change in gait following right knee meniscal repair and decreased mobility/deconditioning following this surgery.  She was shown and advised to do back stretching exercises while sitting in a chair.  Try amitriptyline at nighttime to help with sleep and back spasms. Stop Robaxin which has not helped.  Heel lift for right foot was given to allow more even distribution of weight due to her using more ball of her right foot at this time instead of rolling through feet. She is still going through physical therapy with gait and right knee rehabilitation.

## 2012-06-05 NOTE — Patient Instructions (Addendum)
To help you sleep at night: -Start amitriptyline at bedtime. Start with 1 tablet at bedtime for a few days. You may go up to 2 tablets daily after 3 days if you are tolerating the lower dose okay.   Exercises: do in a chair 1. Bend forward and grab your knees and pull in 2. Then rotate to the left and grab your knees at pull in 3. Then rotate to the right and grab your knees and pull in 4. Bend forward and touch the ground and then walk your hands forward; hold for 5 sec; then walk it back  Heel lifts in your right shoe

## 2012-06-07 ENCOUNTER — Encounter: Payer: Self-pay | Admitting: Gastroenterology

## 2012-06-07 ENCOUNTER — Ambulatory Visit (INDEPENDENT_AMBULATORY_CARE_PROVIDER_SITE_OTHER): Payer: Medicaid Other | Admitting: Gastroenterology

## 2012-06-07 VITALS — BP 120/60 | HR 60 | Ht 66.0 in | Wt 233.0 lb

## 2012-06-07 DIAGNOSIS — F1920 Other psychoactive substance dependence, uncomplicated: Secondary | ICD-10-CM

## 2012-06-07 DIAGNOSIS — K5903 Drug induced constipation: Secondary | ICD-10-CM

## 2012-06-07 DIAGNOSIS — K219 Gastro-esophageal reflux disease without esophagitis: Secondary | ICD-10-CM

## 2012-06-07 DIAGNOSIS — IMO0001 Reserved for inherently not codable concepts without codable children: Secondary | ICD-10-CM

## 2012-06-07 DIAGNOSIS — K5909 Other constipation: Secondary | ICD-10-CM

## 2012-06-07 MED ORDER — ESOMEPRAZOLE MAGNESIUM 40 MG PO CPDR: 40.0000 mg | DELAYED_RELEASE_CAPSULE | Freq: Every day | ORAL | Status: DC

## 2012-06-07 MED ORDER — LINACLOTIDE 290 MCG PO CAPS: 1.0000 | ORAL_CAPSULE | Freq: Every day | ORAL | Status: DC

## 2012-06-07 NOTE — Progress Notes (Signed)
This is a morbidly obese 46 year old Caucasian female who is narcotic dependent because of multiple medical problems who has multiple drug allergies and has a history of chronic multiple GI complaints.  She is up-to-date on her endoscopy and colonoscopy.  She was on Nexium 40 mg twice a day which was discontinued by her primary care physician, and she has had recurrence of her acid reflux symptoms.  She's had chronic functional constipation for over 20 years, has been refractory to all attempts at constipation management.  As long and involved medical history that I will not repeat at this time.  It is of note that she is on a variety of narcotics including dilaud that and oxycodone.  This patient also has HIV but not active AIDS apparently.  Most of her medical care is given through [facility] infectious disease, Dr. Enid Baas and Dr.Q.  Current Medications, Allergies, Past Medical History, Past Surgical History, Family History and Social History were reviewed in [employer] record.  Pertinent Review of Systems Negative   Physical Exam: Massively obese patient in no acute distress.  Has a hostile attitude about seeing the physician and being examined.  Her blood pressure 120/60, pulse 60 and regular, and weight 233 pounds the BMI of 37.61.  She has a massively obese abdomen without definite organomegaly, masses or tenderness.  I cannot appreciate a succussion splash.  Oriented x3.  The patient repeatedly voices the opinion that none of her problems are related to her obesity or narcotic use.    Assessment and Plan: Chronic GERD requiring twice a day PPI therapy.  She is not a candidate for prokinetic therapy such as Reglan.  Her chronic functional constipation is related to her narcotic dependency, she's had negative colonoscopies.  I have given her a trial of Linzess 290 mcg a day for her constipation.  I suggested she seek another gastroenterologist care because of her very  poor attitude.  She has not need further endoscopic exams, and is not a candidate for conscious sedation.  I suggest that she coordinate her care through her primary care physicians.  No labs were drawn today. No diagnosis found.

## 2012-06-07 NOTE — Patient Instructions (Addendum)
We have given you samples of the following medication to take: Linzess 290 mcg. Please take one capsule by mouth once daily. If this works for you please call us so we can send in a prescription. AND Nexium please take one capsule by mouth twice daily and if this works well for you please call us back.   Per Dr. Jarold Motto ok to stay on Prilosec per your request you would like to try Prilosec and Nexium.   Please follow up in one year.

## 2012-06-21 ENCOUNTER — Other Ambulatory Visit: Payer: Self-pay | Admitting: *Deleted

## 2012-06-21 ENCOUNTER — Other Ambulatory Visit (HOSPITAL_BASED_OUTPATIENT_CLINIC_OR_DEPARTMENT_OTHER): Payer: Medicaid Other | Admitting: Lab

## 2012-06-21 ENCOUNTER — Ambulatory Visit (HOSPITAL_COMMUNITY)
Admission: RE | Admit: 2012-06-21 | Discharge: 2012-06-21 | Disposition: A | Discharge status: Home / Self Care | Payer: Medicaid Other | Source: Ambulatory Visit | Attending: Oncology | Admitting: Oncology

## 2012-06-21 DIAGNOSIS — C8589 Other specified types of non-Hodgkin lymphoma, extranodal and solid organ sites: Secondary | ICD-10-CM | POA: Insufficient documentation

## 2012-06-21 DIAGNOSIS — C8590 Non-Hodgkin lymphoma, unspecified, unspecified site: Secondary | ICD-10-CM

## 2012-06-21 LAB — SEDIMENTATION RATE: Sed Rate: 12 mm/hr (ref 0–22)

## 2012-06-21 LAB — LACTATE DEHYDROGENASE (CC13): LDH: 141 U/L (ref 125–245)

## 2012-06-21 LAB — COMPREHENSIVE METABOLIC PANEL (CC13)
ALT: 15 U/L (ref 0–55)
AST: 17 U/L (ref 5–34)
BUN: 10 mg/dL (ref 7.0–26.0)
CO2: 32 mEq/L — ABNORMAL HIGH (ref 22–29)
Creatinine: 0.9 mg/dL (ref 0.6–1.1)
Total Bilirubin: 0.22 mg/dL (ref 0.20–1.20)

## 2012-06-21 LAB — CBC WITH DIFFERENTIAL/PLATELET
BASO%: 0.2 % (ref 0.0–2.0)
LYMPH%: 37.6 % (ref 14.0–49.7)
MCH: 29.1 pg (ref 25.1–34.0)
MCHC: 33.5 g/dL (ref 31.5–36.0)
MCV: 86.7 fL (ref 79.5–101.0)
MONO%: 5 % (ref 0.0–14.0)
NEUT%: 54.5 % (ref 38.4–76.8)
Platelets: 161 10*3/uL (ref 145–400)
RBC: 4.55 10*6/uL (ref 3.70–5.45)

## 2012-07-10 ENCOUNTER — Encounter: Payer: Self-pay | Admitting: Internal Medicine

## 2012-07-10 ENCOUNTER — Ambulatory Visit (INDEPENDENT_AMBULATORY_CARE_PROVIDER_SITE_OTHER): Payer: Medicaid Other | Admitting: Internal Medicine

## 2012-07-10 VITALS — BP 129/80 | HR 84 | Temp 97.1°F | Ht 66.0 in | Wt 245.9 lb

## 2012-07-10 DIAGNOSIS — G894 Chronic pain syndrome: Secondary | ICD-10-CM

## 2012-07-10 DIAGNOSIS — F419 Anxiety disorder, unspecified: Secondary | ICD-10-CM

## 2012-07-10 DIAGNOSIS — F411 Generalized anxiety disorder: Secondary | ICD-10-CM

## 2012-07-10 DIAGNOSIS — E538 Deficiency of other specified B group vitamins: Secondary | ICD-10-CM

## 2012-07-10 MED ORDER — ALPRAZOLAM 2 MG PO TABS: 2.0000 mg | ORAL_TABLET | Freq: Two times a day (BID) | ORAL | Status: DC | PRN

## 2012-07-10 MED ORDER — OXYCODONE-ACETAMINOPHEN 5-325 MG PO TABS: 1.0000 | ORAL_TABLET | Freq: Four times a day (QID) | ORAL | Status: DC | PRN

## 2012-07-10 MED ORDER — CYANOCOBALAMIN 1000 MCG/ML IJ SOLN
1000.0000 ug | Freq: Once | INTRAMUSCULAR | Status: AC
  Administered 2012-07-10: 1000 ug via INTRAMUSCULAR

## 2012-07-10 NOTE — Progress Notes (Signed)
Internal Medicine Clinic Visit    HPI:  [name] is a 47 y.o. year old female with chronic pain, chronic narcotic use, anxiety, depression, HIV, chronic migraines, chronic constipation. She presents for refills on her xanax and percocet. She could not get an appointment in her PCP therefore she was seen in the [facility] acute clinic.  [name] brought her percocet bottle with her today and she did have an appropriate amount left over. She does take it 4 times per day. She also requests a refill on her xanax which she takes 3 times per day for anxiety and sleep. She states that she has frequent panic attacks and significant stressors at home right now. She states that she is on narcotics for chronic back pain and knee pain and refers to her surgery in October which she is "still recovering" from. She is going to physical therapy 3 times a week, per patient.  She denies any changes in her back pain. No saddles anesthesia or incontinence.   Past Medical History  Diagnosis Date  . Anemia 10/2009    macrocytic anemia with baseline MCV 104-106  . PUD (peptic ulcer disease) 07/1997    per EGD report 07/1997 with history of esophagitis  . Migraines     on topamax and triptans, frequent (almost daily) attacks   . Hyperlipidemia   . HIV infection     CD4 = 570 (05/2010), VL undetectation  . Anxiety   . Spondylolisthesis of lumbar region     L5-S1  . Gastroparesis   . Shingles     in lumbar dermatome  . Hypertension     no meds  . Non Hodgkin's lymphoma     stage II, s/p resection, chemotherapy, radiotherapy, Dr. Cyndie Chime is her oncologist.   . Asthma   . DVT (deep venous thrombosis)     history clot lt groin when she had lymphoma-in remision now.  . Traumatic tear of lateral meniscus of right knee 04/20/2012  . 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 Hiatal hernia 01/01/2002  . Acute gastritis without mention of hemorrhage 01/01/2002  . Duodenitis without mention of hemorrhage 01/01/2002    . GERD (gastroesophageal reflux disease)   . IBS (irritable bowel syndrome)     Past Surgical History  Procedure Date  . Upper gastrointestinal endoscopy 2003, 2007    done by Dr. Corinda Gubler  . Tonsillectomy   . Endometrial ablation   . Tympanostomy tube placement     as child-both  . Tympanoplasty     left  . Cholecystectomy   . Knee arthroscopy 04/20/12    right     ROS:  A complete review of systems was otherwise negative, except as noted in the HPI.  Allergies: Diazepam; Divalproex sodium; Gabapentin; Ondansetron hcl; Penicillins; Propoxyphene-acetaminophen; Simvastatin; Tramadol hcl; Zocor; and Zofran  Medications: Current Outpatient Prescriptions  Medication Sig Dispense Refill  . albuterol (PROVENTIL HFA;VENTOLIN HFA) 108 (90 BASE) MCG/ACT inhaler Inhale 2 puffs into the lungs every 6 (six) hours as needed for wheezing.  1 Inhaler  6  . albuterol-ipratropium (COMBIVENT) 18-103 MCG/ACT inhaler Inhale 2 puffs into the lungs every 6 (six) hours as needed for wheezing.  3 Inhaler  3  . alprazolam (XANAX) 2 MG tablet Take 1 tablet (2 mg total) by mouth 2 (two) times daily as needed for sleep or anxiety.  90 tablet  1  . amitriptyline (ELAVIL) 50 MG tablet Take 1 tablet (50 mg total) by mouth at bedtime.  60  tablet  0  . Calcium Carbonate-Vitamin D (CALCIUM-VITAMIN D) 500-200 MG-UNIT per tablet Take 1 tablet by mouth daily.  30 tablet  5  . cyanocobalamin (,VITAMIN B-12,) 1000 MCG/ML injection Inject 1 mL (1,000 mcg total) into the muscle every 30 (thirty) days.  1 mL  0  . Darunavir Ethanolate (PREZISTA) 800 MG tablet Take 1 tablet (800 mg total) by mouth daily.  30 tablet  2  . diclofenac sodium (VOLTAREN) 1 % GEL Apply 1 application topically 2 (two) times daily as needed. For pain  1 Tube  2  . DULoxetine (CYMBALTA) 30 MG capsule Take 3 capsules (90 mg total) by mouth daily.  270 capsule  1  . emtricitabine-tenofovir (TRUVADA) 200-300 MG per tablet Take 1 tablet by mouth  daily.  30 tablet  2  . esomeprazole (NEXIUM) 40 MG capsule Take 1 capsule (40 mg total) by mouth daily before breakfast.  15 capsule  0  . fluconazole (DIFLUCAN) 100 MG tablet Take 1 tablet (100 mg total) by mouth daily. Take 150mg  by mouth ONCE for yeast infection.  30 tablet  1  . Linaclotide (LINZESS) 290 MCG CAPS Take 1 capsule by mouth daily.  12 capsule  0  . methocarbamol (ROBAXIN) 750 MG tablet Take 750 mg by mouth 2 (two) times daily.      . mometasone (NASONEX) 50 MCG/ACT nasal spray Place 2 sprays into the nose as needed.      . montelukast (SINGULAIR) 10 MG tablet Take 10 mg by mouth at bedtime.      Marland Kitchen omeprazole (PRILOSEC) 40 MG capsule Take 40 mg by mouth 2 (two) times daily.      Marland Kitchen oxyCODONE-acetaminophen (PERCOCET/ROXICET) 5-325 MG per tablet Take 1 tablet by mouth every 6 (six) hours as needed.  120 tablet  0  . pramipexole (MIRAPEX) 0.5 MG tablet Take 1 tablet (0.5 mg total) by mouth at bedtime.  30 tablet  2  . PRESCRIPTION MEDICATION Ear drops- rx name unknown to pt- one drop up to four times a day right ear      . promethazine (PHENERGAN) 50 MG tablet Take 0.5-1 tablets (25-50 mg total) by mouth every 6 (six) hours as needed for nausea. Up to 4 times a day as needed for nausea  90 tablet  11  . ritonavir (NORVIR) 100 MG TABS Take 1 tablet (100 mg total) by mouth daily.  30 tablet  2  . rosuvastatin (CRESTOR) 20 MG tablet Take 1 tablet (20 mg total) by mouth daily.  30 tablet  11  . scopolamine (TRANSDERM-SCOP) 1.5 MG Place 1 patch (1.5 mg total) onto the skin every other day as needed.  10 patch  3  . Tamsulosin HCl (FLOMAX) 0.4 MG CAPS Take 0.4 mg by mouth daily.        Marland Kitchen terconazole (TERAZOL 3) 0.8 % vaginal cream Place 1 applicator vaginally at bedtime.  20 g  5  . zolmitriptan (ZOMIG) 5 MG tablet Take 1 tablet (5 mg total) by mouth as needed for migraine.  30 tablet  0   Current Facility-Administered Medications  Medication Dose Route Frequency Provider Last Rate Last  Dose  . nitroGLYCERIN (NITROSTAT) SL tablet 0.3 mg  0.3 mg Sublingual Q5 Min x 3 PRN Daryel Gerald, MD        History   Social History  . Marital Status: Divorced    Spouse Name: N/A    Number of Children: 0  . Years of Education: N/A  Occupational History  .     Social History Main Topics  . Smoking status: Never Smoker   . Smokeless tobacco: Never Used  . Alcohol Use: No  . Drug Use: No  . Sexually Active: Yes -- Female partner(s)    Birth Control/ Protection: Condom     Comment: claims to have sex with long term boyfriend 2-3/year always uses protection   Other Topics Concern  . Not on file   Social History Narrative   Lives in [place], [place]    family history includes Breast cancer in her paternal grandmother; Diabetes in her maternal grandmother and maternal uncle; Hypertension in her mother; Kidney cancer in her maternal uncle; and Stroke in her mother.  There is no history of Colon cancer.  Physical Exam Blood pressure 129/80, pulse 84, temperature 97.1 F (36.2 C), temperature source Oral, height 5\' 6"  (1.676 m), weight 245 lb 14.4 oz (111.54 kg), SpO2 98.00%. General:  No acute distress, alert and oriented x 3, well-appearing  HEENT:  PERRL, EOMI, moist mucous membranes Cardiovascular:  Regular rate and rhythm, no murmurs, rubs or gallops Respiratory:  Clear to auscultation bilaterally, no wheezes, rales, or rhonchi Abdomen:  Soft, obese, nondistended, slightly tender, normoactive bowel sounds Extremities:  Warm and well-perfused, no clubbing, cyanosis, or edema.  Skin: Warm, dry, no rashes Neuro: anxious appearing, perseverates on her pain, occasionally tearful MSK: tender over lumbar spine, sensation intact, exam limited by pain  Labs: Lab Results  Component Value Date   CREATININE 0.9 06/21/2012   BUN 10.0 06/21/2012   NA 140 06/21/2012   K 4.2 06/21/2012   CL 100 06/21/2012   CO2 32* 06/21/2012   Lab Results  Component Value Date    WBC 5.0 06/21/2012   HGB 13.2 06/21/2012   HCT 39.4 06/21/2012   MCV 86.7 06/21/2012   PLT 161 06/21/2012      Assessment and Plan:    FOLLOWUP: [name] will follow back up in our clinic at next available with PCP. [name] knows to call out clinic in the meantime with any questions or new issues.   Patient was seen and evaluated by Denton Ar, MD,  and Aletta Edouard, MD Attending Physician.

## 2012-07-10 NOTE — Assessment & Plan Note (Signed)
Patient on chronic narcotics for multiple problems, no apparent violations in pain contract. She did receive refills by Dr. Allena Katz for December and January but requests another refill dated for February. Discussed the risks of chronic narcotics and recommended tapering off this and her other medications. She is currently on a long list of sedating medications for her pain and insomnia and I believe she has developed a tolerance for many of these. She was recently seen by gastroenterologist Dr. Jarold Motto for her chronic constipation thought to be related to narcotic use. She was unwilling to consider her narcotic use as a factor in her constipation.  Patient lacks insight into her condition and seems desperate to get her medications refilled, continuing to tell me stories of how much she needs these medications. I strongly encouraged her to taper her narcotics and expressed my concern for her long term health. As she has a pain contract and her PCP is unavailable for an appt, I will refill these with the understanding that she will follow up and reevaluate the need for chronic narcotics.  -cont physical therapy and follow up with orthopedics and sports medicine -appt with PCP -consider referral to pain clinic  -

## 2012-07-10 NOTE — Patient Instructions (Signed)
Please schedule next available appointment with PCP Dr. Virgina Organ.

## 2012-07-10 NOTE — Assessment & Plan Note (Signed)
Patient continues to have anxiety and reports occasional panic attacks. We discussed different strategies of dealing with conflicts at home and other stressful situation. I expressed my concern over her chronic use of Xanax. She takes this medicine three times per day consistently. Patient agreed to taper this medicine to twice per day. Therefore, we will compromise and move toward discontinuing this medication by tapering to twice per day.  -consider switching to long acting benzo in the future

## 2012-07-17 ENCOUNTER — Ambulatory Visit (HOSPITAL_BASED_OUTPATIENT_CLINIC_OR_DEPARTMENT_OTHER): Payer: Medicaid Other | Admitting: Oncology

## 2012-07-17 ENCOUNTER — Other Ambulatory Visit: Payer: Self-pay | Admitting: *Deleted

## 2012-07-17 ENCOUNTER — Telehealth: Payer: Self-pay | Admitting: Oncology

## 2012-07-17 ENCOUNTER — Other Ambulatory Visit: Payer: Self-pay | Admitting: Licensed Clinical Social Worker

## 2012-07-17 VITALS — BP 115/75 | HR 96 | Temp 97.5°F | Resp 18 | Ht 66.0 in | Wt 240.5 lb

## 2012-07-17 DIAGNOSIS — C8589 Other specified types of non-Hodgkin lymphoma, extranodal and solid organ sites: Secondary | ICD-10-CM

## 2012-07-17 DIAGNOSIS — B20 Human immunodeficiency virus [HIV] disease: Secondary | ICD-10-CM

## 2012-07-17 DIAGNOSIS — C8590 Non-Hodgkin lymphoma, unspecified, unspecified site: Secondary | ICD-10-CM

## 2012-07-17 MED ORDER — DARUNAVIR ETHANOLATE 800 MG PO TABS: 800.0000 mg | ORAL_TABLET | Freq: Every day | ORAL | Status: DC

## 2012-07-17 MED ORDER — LINACLOTIDE 290 MCG PO CAPS: 1.0000 | ORAL_CAPSULE | Freq: Every day | ORAL | Status: DC

## 2012-07-17 MED ORDER — AMITRIPTYLINE HCL 50 MG PO TABS: 50.0000 mg | ORAL_TABLET | Freq: Every day | ORAL | Status: DC
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
## 2012-07-17 NOTE — Telephone Encounter (Signed)
Gave pt appt for January 2015 lab and MD

## 2012-07-17 NOTE — Telephone Encounter (Signed)
Last filled 06/05/12 Qty #30.

## 2012-07-18 ENCOUNTER — Ambulatory Visit: Payer: Medicaid Other | Admitting: Sports Medicine

## 2012-07-25 ENCOUNTER — Ambulatory Visit: Payer: Medicaid Other | Admitting: Sports Medicine

## 2012-08-07 ENCOUNTER — Encounter: Payer: Self-pay | Admitting: Sports Medicine

## 2012-08-07 ENCOUNTER — Ambulatory Visit (INDEPENDENT_AMBULATORY_CARE_PROVIDER_SITE_OTHER): Payer: Medicaid Other | Admitting: Sports Medicine

## 2012-08-07 VITALS — BP 128/81 | HR 99 | Ht 66.0 in | Wt 230.0 lb

## 2012-08-07 DIAGNOSIS — S134XXA Sprain of ligaments of cervical spine, initial encounter: Secondary | ICD-10-CM

## 2012-08-07 DIAGNOSIS — M25561 Pain in right knee: Secondary | ICD-10-CM

## 2012-08-07 DIAGNOSIS — S139XXA Sprain of joints and ligaments of unspecified parts of neck, initial encounter: Secondary | ICD-10-CM

## 2012-08-07 DIAGNOSIS — M25569 Pain in unspecified knee: Secondary | ICD-10-CM

## 2012-08-07 MED ORDER — AMITRIPTYLINE HCL 50 MG PO TABS: 50.0000 mg | ORAL_TABLET | Freq: Every day | ORAL | Status: DC

## 2012-08-07 NOTE — Assessment & Plan Note (Signed)
Cont her exercises from PT at home  Given a series of isometric exercises to do as well to get better neck strength She is to RTC for this in 2 mos
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
## 2012-08-07 NOTE — Patient Instructions (Addendum)
Add isometric neck exercises several times per day to home exercise program  Do relaxation stretch- laying on floor with arms stretched out to the side- relaxing your neck  Work on getting correct size knee brace  Please follow up in 2 months  Ok to stop physical therapy for neck, but continue for knee  Thank you for seeing [place] today!

## 2012-08-07 NOTE — Assessment & Plan Note (Signed)
Now 3 mos + past surgery She needs to keep up PT for this  RTC at ortho for brace refit

## 2012-08-15 ENCOUNTER — Other Ambulatory Visit: Payer: Self-pay | Admitting: *Deleted

## 2012-08-15 DIAGNOSIS — G2581 Restless legs syndrome: Secondary | ICD-10-CM

## 2012-08-15 NOTE — Telephone Encounter (Signed)
Flag sent to front desk pool to make appt per Dr Virgina Organ.

## 2012-08-15 NOTE — Telephone Encounter (Signed)
She was not on in this medicine in the past, is it even working? Does she need the refill and if so, why? This is a lady with polypharmacy and we are trying to control ALL the medications she is on.

## 2012-08-21 ENCOUNTER — Other Ambulatory Visit: Payer: Self-pay | Admitting: Internal Medicine

## 2012-08-21 NOTE — Telephone Encounter (Signed)
Pt called message left to call clinic for questions.

## 2012-08-21 NOTE — Telephone Encounter (Signed)
Needs to follow up with ID for fluconazole prescription.  Why does she need mirapex? Was not on it before.  Will prescribe voltaren.

## 2012-09-03 ENCOUNTER — Encounter: Payer: Medicaid Other | Admitting: Internal Medicine

## 2012-09-04 ENCOUNTER — Other Ambulatory Visit: Payer: Medicaid Other

## 2012-09-06 ENCOUNTER — Other Ambulatory Visit (HOSPITAL_COMMUNITY)
Admission: RE | Admit: 2012-09-06 | Discharge: 2012-09-06 | Disposition: A | Discharge status: Home / Self Care | Payer: Medicaid Other | Source: Ambulatory Visit | Attending: Infectious Disease | Admitting: Infectious Disease

## 2012-09-06 ENCOUNTER — Other Ambulatory Visit: Payer: Medicaid Other

## 2012-09-06 DIAGNOSIS — B20 Human immunodeficiency virus [HIV] disease: Secondary | ICD-10-CM

## 2012-09-06 DIAGNOSIS — Z113 Encounter for screening for infections with a predominantly sexual mode of transmission: Secondary | ICD-10-CM | POA: Insufficient documentation

## 2012-09-06 LAB — CBC WITH DIFFERENTIAL/PLATELET
Basophils Absolute: 0 10*3/uL (ref 0.0–0.1)
Basophils Relative: 0 % (ref 0–1)
Eosinophils Absolute: 0.1 10*3/uL (ref 0.0–0.7)
Eosinophils Relative: 2 % (ref 0–5)
MCH: 27.9 pg (ref 26.0–34.0)
MCHC: 33.4 g/dL (ref 30.0–36.0)
Neutrophils Relative %: 56 % (ref 43–77)
Platelets: 166 10*3/uL (ref 150–400)
RBC: 4.88 MIL/uL (ref 3.87–5.11)
RDW: 14.5 % (ref 11.5–15.5)

## 2012-09-06 LAB — LIPID PANEL: Cholesterol: 211 mg/dL — ABNORMAL HIGH (ref 0–200)

## 2012-09-07 ENCOUNTER — Encounter: Payer: Medicaid Other | Admitting: Internal Medicine

## 2012-09-07 LAB — COMPLETE METABOLIC PANEL WITH GFR
ALT: 9 U/L (ref 0–35)
Albumin: 4.7 g/dL (ref 3.5–5.2)
CO2: 31 mEq/L (ref 19–32)
Calcium: 9.6 mg/dL (ref 8.4–10.5)
Chloride: 102 mEq/L (ref 96–112)
Creat: 0.8 mg/dL (ref 0.50–1.10)
GFR, Est African American: >89 mL/min
Potassium: 3.6 mEq/L (ref 3.5–5.3)
Total Protein: 7.4 g/dL (ref 6.0–8.3)

## 2012-09-07 LAB — T-HELPER CELL (CD4) - (RCID CLINIC ONLY)
CD4 % Helper T Cell: 35 % (ref 33–55)
CD4 T Cell Abs: 640 uL (ref 400–2700)

## 2012-09-11 LAB — HIV-1 RNA QUANT-NO REFLEX-BLD: HIV-1 RNA Quant, Log: <1.3 {Log} (ref <1.30)

## 2012-09-13 ENCOUNTER — Ambulatory Visit (INDEPENDENT_AMBULATORY_CARE_PROVIDER_SITE_OTHER): Payer: Medicaid Other | Admitting: Internal Medicine

## 2012-09-13 ENCOUNTER — Encounter: Payer: Self-pay | Admitting: Internal Medicine

## 2012-09-13 ENCOUNTER — Other Ambulatory Visit: Payer: Self-pay | Admitting: Internal Medicine

## 2012-09-13 VITALS — BP 116/84 | HR 103 | Temp 97.4°F | Ht 65.0 in | Wt 244.5 lb

## 2012-09-13 DIAGNOSIS — G8929 Other chronic pain: Secondary | ICD-10-CM

## 2012-09-13 DIAGNOSIS — F411 Generalized anxiety disorder: Secondary | ICD-10-CM

## 2012-09-13 DIAGNOSIS — Z79899 Other long term (current) drug therapy: Secondary | ICD-10-CM

## 2012-09-13 DIAGNOSIS — G894 Chronic pain syndrome: Secondary | ICD-10-CM

## 2012-09-13 DIAGNOSIS — F419 Anxiety disorder, unspecified: Secondary | ICD-10-CM

## 2012-09-13 MED ORDER — OXYCODONE-ACETAMINOPHEN 5-325 MG PO TABS: 1.0000 | ORAL_TABLET | Freq: Four times a day (QID) | ORAL | Status: DC | PRN

## 2012-09-13 MED ORDER — ALPRAZOLAM 1 MG PO TABS: 1.0000 mg | ORAL_TABLET | Freq: Two times a day (BID) | ORAL | Status: DC | PRN

## 2012-09-13 NOTE — Patient Instructions (Addendum)
You have received your prescriptions for the next 3 months until July 2017  Follow up with your ortho and other appoints  Follow up with Dr. Daiva Eves

## 2012-09-14 ENCOUNTER — Other Ambulatory Visit: Payer: Self-pay | Admitting: Infectious Disease

## 2012-09-14 LAB — PRESCRIPTION ABUSE MONITORING 15P, URINE
Amphetamine/Meth: NEGATIVE ng/mL
Buprenorphine, Urine: NEGATIVE ng/mL
Cannabinoid Scrn, Ur: NEGATIVE ng/mL
Carisoprodol, Urine: NEGATIVE ng/mL
Fentanyl, Ur: NEGATIVE ng/mL
Tramadol Scrn, Ur: NEGATIVE ng/mL
Zolpidem, Urine: NEGATIVE ng/mL

## 2012-09-17 LAB — BENZODIAZEPINES (GC/LC/MS), URINE
Diazepam (GC/LC/MS), ur confirm: NEGATIVE ng/mL
Estazolam (GC/LC/MS), ur confirm: NEGATIVE ng/mL
Flunitrazepam metabolite (GC/LC/MS), ur confirm: NEGATIVE ng/mL
Flurazepam metabolite (GC/LC/MS), ur confirm: NEGATIVE ng/mL
Midazolam (GC/LC/MS), ur confirm: NEGATIVE ng/mL
Nordiazepam (GC/LC/MS), ur confirm: NEGATIVE ng/mL
Oxazepam (GC/LC/MS), ur confirm: NEGATIVE ng/mL
Temazepam (GC/LC/MS), ur confirm: NEGATIVE ng/mL
Triazolam metabolite (GC/LC/MS), ur confirm: NEGATIVE ng/mL

## 2012-09-17 LAB — OPIATES/OPIOIDS (LC/MS-MS)
Codeine Urine: NEGATIVE ng/mL
Heroin (6-AM), UR: NEGATIVE ng/mL
Hydromorphone: NEGATIVE ng/mL
Morphine Urine: NEGATIVE ng/mL
Norhydrocodone, Ur: NEGATIVE ng/mL
Noroxycodone, Ur: 1166 ng/mL — ABNORMAL HIGH

## 2012-09-17 LAB — OXYCODONE, URINE (LC/MS-MS)
Noroxycodone, Ur: 1166 ng/mL — ABNORMAL HIGH
Oxycodone, ur: 2677 ng/mL — ABNORMAL HIGH

## 2012-09-17 NOTE — Assessment & Plan Note (Signed)
Actively trying to titrate off multiple medications despite her resistance.  Have explained multiple times of polypharmacy and need to titrate and use supportive therapy and better stress management.     -decreased xanax to 1mg  bid prn -WILL CONTINUE TO TITRATE DOWN -may transition to long acting benzo if not able to titrate completely off which is likely given her anxiety

## 2012-09-17 NOTE — Assessment & Plan Note (Signed)
ACTIVELY trying to taper all medications.  Has recently had knee surgery and continues physical therapy.  Have explained multiple times dangers of polypharmacy, sedating medications and narcotics.  She becomes very defensive and tearful when the subject is touched, however, i continue to purse discussions and active tapering.  We have reduced anxiety medications and dosage and need to now also start tapering pain medication since surgery has been some while and recovery seems to be dong well.  I expect this to be a very difficult process and may need to refer to pain clinic again.  She is on pain contract in clinic and has no violated.    -UDS collected today -pain meds refilled for march, April, and may--will hopefully start tapering again in June -refused to refill mirapex today for "restless leg syndrome" as do not believe this is effective treatment at this time as she continue to complain of symptoms despite being on the medication and she is also on benzo which should help. She insists that mirapex works but when i say that she was still having symptoms she gets apprehensive and says well its the only thing that helps keep it under control but still not completely improved.   -continue PT -was referred to pain clinic 02/2012 but had to have knee surgery.  Will attempt to refer again.

## 2012-09-17 NOTE — Assessment & Plan Note (Signed)
ACTIVELY TRYING TO CUT DOWN MEDICATIONS OFF LIST and tapering pain and anxiety medications.

## 2012-09-17 NOTE — Progress Notes (Signed)
Subjective:   Patient ID: [name] female   DOB: Jun 21, 1966 46 y.o.   MRN: [id number]  HPI: [name] is a 47 y.o. white female with PMH of HIV well controlled, chronic pain, s/p right knee surgery presenting to clinic today for routine follow up visit.    [name] claims to be following up with sports medicine and PT for her knee and recovery appears to be doing well.  She is also following up in ID clinic.  She continues to complain of anxiety and life stressors including multiple family issues and deaths and is tearful during exam, which is what normally happens on every visit.  When I discuss need to taper medications, she becomes very defensive and tearful and going over why she needs all her medications.  We have discussed again more and have agreed to continue to taper medications.  Today will decrease dose of xanax to 1mg  and hopefully eventually taper completely off.    Her right knee arthorscopy was in 04/2012 and she has been in recovery since then.  Pain medications will also need to be tapered again since much time has passed and recovery is going well.  i expect this to meet a lot of resistance from her but is necessary to prevent polypharmacy that she has been on despite her reservations.  She was referred to pain clinic by me in august 2013 but was canceled due to upcoming surgery.  This will need to be rescheduled.    She also complains of restless leg syndrome insisting that she needs her mirapex.  I explained to her that i did not prescribe the medication nor do i agree with it at this time especially given polypharmacy and that she is already on benzo's which is indicated for treatment.  Additionally, despite being on the mirapex she complained of symptoms, thus it is clear that it was not working and this is difficult for her to understand.   Past Medical History  Diagnosis Date  . Anemia 10/2009    macrocytic anemia with baseline MCV 104-106  . PUD (peptic ulcer  disease) 07/1997    per EGD report 07/1997 with history of esophagitis  . Migraines     on topamax and triptans, frequent (almost daily) attacks   . Hyperlipidemia   . HIV infection     CD4 = 570 (05/2010), VL undetectation  . Anxiety   . Spondylolisthesis of lumbar region     L5-S1  . Gastroparesis   . Shingles     in lumbar dermatome  . Hypertension     no meds  . Non Hodgkin's lymphoma     stage II, s/p resection, chemotherapy, radiotherapy, Dr. Cyndie Chime is her oncologist.   . Asthma   . DVT (deep venous thrombosis)     history clot lt groin when she had lymphoma-in remision now.  . Traumatic tear of lateral meniscus of right knee 04/20/2012  . 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 Hiatal hernia 01/01/2002  . Acute gastritis without mention of hemorrhage 01/01/2002  . Duodenitis without mention of hemorrhage 01/01/2002  . GERD (gastroesophageal reflux disease)   . IBS (irritable bowel syndrome)    Current Outpatient Prescriptions  Medication Sig Dispense Refill  . albuterol (PROVENTIL HFA;VENTOLIN HFA) 108 (90 BASE) MCG/ACT inhaler Inhale 2 puffs into the lungs every 6 (six) hours as needed for wheezing.  1 Inhaler  6  . albuterol-ipratropium (COMBIVENT) 18-103 MCG/ACT inhaler Inhale 2 puffs into the lungs  every 6 (six) hours as needed for wheezing.  3 Inhaler  3  . alprazolam (XANAX) 1 MG tablet Take 1 tablet (1 mg total) by mouth 2 (two) times daily as needed for sleep or anxiety.  60 tablet  1  . amitriptyline (ELAVIL) 50 MG tablet Take 1 tablet (50 mg total) by mouth at bedtime.  60 tablet  3  . Calcium Carbonate-Vitamin D (CALCIUM-VITAMIN D) 500-200 MG-UNIT per tablet Take 1 tablet by mouth daily.      . cyanocobalamin (,VITAMIN B-12,) 1000 MCG/ML injection Inject 1 mL (1,000 mcg total) into the muscle every 30 (thirty) days.  1 mL  0  . Darunavir Ethanolate (PREZISTA) 800 MG tablet Take 1 tablet (800 mg total) by mouth daily.  30 tablet  3  . DULoxetine (CYMBALTA) 30  MG capsule Take 3 capsules (90 mg total) by mouth daily.  270 capsule  1  . emtricitabine-tenofovir (TRUVADA) 200-300 MG per tablet Take 1 tablet by mouth daily.  30 tablet  2  . montelukast (SINGULAIR) 10 MG tablet Take 10 mg by mouth at bedtime as needed.       Marland Kitchen omeprazole (PRILOSEC) 40 MG capsule Take 40 mg by mouth 2 (two) times daily.      Melene Muller ON 10/18/2012] oxyCODONE-acetaminophen (PERCOCET/ROXICET) 5-325 MG per tablet Take 1 tablet by mouth every 6 (six) hours as needed. Please do not fill before October 18, 2012  120 tablet  0  . PRESCRIPTION MEDICATION Ear drops- rx name unknown to pt- one drop up to four times a day right ear      . promethazine (PHENERGAN) 50 MG tablet Take 0.5-1 tablets (25-50 mg total) by mouth every 6 (six) hours as needed for nausea. Up to 4 times a day as needed for nausea  90 tablet  11  . ritonavir (NORVIR) 100 MG TABS Take 1 tablet (100 mg total) by mouth daily.  30 tablet  2  . rosuvastatin (CRESTOR) 20 MG tablet Take 1 tablet (20 mg total) by mouth daily.  30 tablet  11  . scopolamine (TRANSDERM-SCOP) 1.5 MG Place 1 patch (1.5 mg total) onto the skin every other day as needed.  10 patch  3  . Tamsulosin HCl (FLOMAX) 0.4 MG CAPS Take 0.4 mg by mouth daily.        Marland Kitchen terconazole (TERAZOL 3) 0.8 % vaginal cream Place 1 applicator vaginally at bedtime.  20 g  5  . VOLTAREN 1 % GEL APPLY TOPICALLY TWICE A DAY AS NEEDED FOR PAIN  100 g  0  . Linaclotide (LINZESS) 290 MCG CAPS Take 1 capsule by mouth daily.  30 capsule  0  . mometasone (NASONEX) 50 MCG/ACT nasal spray Place 2 sprays into the nose as needed.      Melene Muller ON 11/17/2012] oxyCODONE-acetaminophen (PERCOCET/ROXICET) 5-325 MG per tablet Take 1 tablet by mouth every 6 (six) hours as needed for pain. Do not fill before Nov 17, 2012  120 tablet  0  . oxyCODONE-acetaminophen (PERCOCET/ROXICET) 5-325 MG per tablet Take 1 tablet by mouth every 6 (six) hours as needed for pain. Do not fill before September 17, 2012   120 tablet  0   No current facility-administered medications for this visit.   Family History  Problem Relation Age of Onset  . Hypertension Mother   . Stroke Mother   . Breast cancer Paternal Grandmother   . Colon cancer Neg Hx   . Kidney cancer Maternal Uncle   .  Diabetes Maternal Uncle   . Diabetes Maternal Grandmother    History   Social History  . Marital Status: Divorced    Spouse Name: N/A    Number of Children: 0  . Years of Education: N/A   Occupational History  .     Social History Main Topics  . Smoking status: Never Smoker   . Smokeless tobacco: Never Used  . Alcohol Use: No  . Drug Use: No  . Sexually Active: None     Comment: claims to have sex with long term boyfriend 2-3/year always uses protection   Other Topics Concern  . None   Social History Narrative   Lives in [place], [place]   Review of Systems: Constitutional: Denies fever, chills, diaphoresis, appetite change and fatigue.  HEENT: Denies photophobia, eye pain, redness, hearing loss, ear pain, congestion, sore throat, rhinorrhea, sneezing, mouth sores, trouble swallowing, neck pain, neck stiffness and tinnitus.   Respiratory: Denies SOB, DOE, cough, chest tightness,  and wheezing.   Cardiovascular: Denies chest pain, palpitations and leg swelling.  Gastrointestinal: Denies nausea, vomiting, abdominal pain, diarrhea, constipation, blood in stool and abdominal distention.  Genitourinary: Denies dysuria, urgency, frequency, hematuria, flank pain and difficulty urinating.  Musculoskeletal: chronic pain syndrome.   Skin: Denies pallor, rash and wound.  Neurological: Denies dizziness, seizures, syncope, weakness, light-headedness, numbness and headaches.  Hematological: Denies adenopathy. Easy bruising, personal or family bleeding history  Psychiatric/Behavioral:Anxiety.   Denies suicidal ideation, mood changes, confusion, nervousness, sleep disturbance and agitation  Objective:  Physical  Exam: Filed Vitals:   09/13/12 1550  BP: 116/84  Pulse: 103  Temp: 97.4 F (36.3 C)  TempSrc: Oral  Height: 5\' 5"  (1.651 m)  Weight: 244 lb 8 oz (110.904 kg)  SpO2: 98%   Constitutional: Vital signs reviewed.  Patient is a well-developed and well-nourished female in no acute distress and cooperative with exam. Alert and oriented x3. Occasionally tearful when discussing anxiety related factors in life at this time and during discussion to taper medications.   Head: Normocephalic and atraumatic Ear: TM normal bilaterally Mouth: no erythema or exudates, MMM Eyes: PERRL, EOMI, conjunctivae normal, No scleral icterus.  Neck: Supple, Trachea midline normal ROM Cardiovascular: RRR, S1 normal, S2 normal, no MRG, pulses symmetric and intact bilaterally Pulmonary/Chest: CTAB, no wheezes, rales, or rhonchi Abdominal: Soft. Non-tender, non-distended, bowel sounds are normal, no masses, organomegaly, or guarding present.  GU: no CVA tenderness Musculoskeletal: Right leg brace in place.  nontender to palpation.  Good ROM.   Hematology: no cervical, inginal, or axillary adenopathy.  Neurological: A&O x3, Strength is normal and symmetric bilaterally, cranial nerve II-XII are grossly intact, no focal motor deficit, sensory intact to light touch bilaterally.  Skin: Warm, dry and intact. No rash, cyanosis, or clubbing.  Psychiatric: Normal mood and affect. speech and behavior is normal. Judgment and thought content normal. Cognition and memory are normal.   Assessment & Plan:  Discussed with Dr. Rogelia Boga  Chronic pain and anxiety: refills for pain medication done for march, April, and may. uds collected.  xanx tapered in dose to 1mg  bid prn.  Will need pain clinic referral again before next visit.

## 2012-09-18 ENCOUNTER — Ambulatory Visit: Payer: Medicaid Other | Admitting: Infectious Disease

## 2012-09-19 ENCOUNTER — Encounter: Payer: Self-pay | Admitting: Infectious Disease

## 2012-09-19 ENCOUNTER — Other Ambulatory Visit: Payer: Self-pay | Admitting: *Deleted

## 2012-09-19 ENCOUNTER — Ambulatory Visit (INDEPENDENT_AMBULATORY_CARE_PROVIDER_SITE_OTHER): Payer: Medicaid Other | Admitting: Infectious Disease

## 2012-09-19 VITALS — BP 122/84 | HR 114 | Temp 97.9°F | Wt 240.0 lb

## 2012-09-19 DIAGNOSIS — R39198 Other difficulties with micturition: Secondary | ICD-10-CM

## 2012-09-19 DIAGNOSIS — C8589 Other specified types of non-Hodgkin lymphoma, extranodal and solid organ sites: Secondary | ICD-10-CM

## 2012-09-19 DIAGNOSIS — R05 Cough: Secondary | ICD-10-CM

## 2012-09-19 DIAGNOSIS — R3989 Other symptoms and signs involving the genitourinary system: Secondary | ICD-10-CM

## 2012-09-19 DIAGNOSIS — E538 Deficiency of other specified B group vitamins: Secondary | ICD-10-CM

## 2012-09-19 DIAGNOSIS — B20 Human immunodeficiency virus [HIV] disease: Secondary | ICD-10-CM

## 2012-09-19 DIAGNOSIS — S8991XS Unspecified injury of right lower leg, sequela: Secondary | ICD-10-CM

## 2012-09-19 DIAGNOSIS — R300 Dysuria: Secondary | ICD-10-CM

## 2012-09-19 DIAGNOSIS — IMO0001 Reserved for inherently not codable concepts without codable children: Secondary | ICD-10-CM

## 2012-09-19 DIAGNOSIS — C8590 Non-Hodgkin lymphoma, unspecified, unspecified site: Secondary | ICD-10-CM

## 2012-09-19 MED ORDER — CYANOCOBALAMIN 1000 MCG/ML IJ SOLN
1000.0000 ug | Freq: Once | INTRAMUSCULAR | Status: AC
  Administered 2012-09-19: 1000 ug via INTRAMUSCULAR

## 2012-09-19 MED ORDER — SULFAMETHOXAZOLE-TMP DS 800-160 MG PO TABS: 1.0000 | ORAL_TABLET | Freq: Two times a day (BID) | ORAL | Status: DC

## 2012-09-19 NOTE — Telephone Encounter (Signed)
Refill is for combivent respimat 20/111mcg inh Will you change, please?

## 2012-09-19 NOTE — Progress Notes (Signed)
Subjective:    Patient ID: [name], female    DOB: 1966/03/11, 47 y.o.   MRN: [id number]  HPI   [name] is a 47 y.o. female who is doing superbly well on her  antiviral regimen, Prezista Norvir and Truvada with undetectable viral load and health cd4 count.  She is concerned that she may have a UTI he has burning with urination and apparently difficulty emptying her bladder. She denies fevers or flank pain.  Is also concerned about her weight gain her abdomen swells up fatty deposition that occurred behind her neck. Some of this had improved with exercise and diet but still persists and she was wondering about human growth hormone to treat HIV associated lipodystrophy.  We discussed very soft tissue this but has not yet proceed to prescribe this medication for the patient in part because there is a back order on the prescriptions from the manufacturer. Also do have some hesitancy given her history of prior malignancy.   Review of Systems  Constitutional: Negative for fever, chills, diaphoresis, activity change, appetite change, fatigue and unexpected weight change.  HENT: Negative for congestion, sore throat, facial swelling, rhinorrhea, sneezing, trouble swallowing, postnasal drip and sinus pressure.   Eyes: Negative for photophobia and visual disturbance.  Respiratory: Negative for cough, chest tightness, shortness of breath, wheezing and stridor.   Cardiovascular: Negative for chest pain, palpitations and leg swelling.  Gastrointestinal: Negative for nausea, vomiting, abdominal pain, diarrhea, constipation, blood in stool, abdominal distention and anal bleeding.  Genitourinary: Positive for dysuria and difficulty urinating. Negative for hematuria and flank pain.  Musculoskeletal: Negative for myalgias, back pain, joint swelling, arthralgias and gait problem.  Skin: Negative for color change, pallor, rash and wound.  Neurological: Negative for dizziness, tremors, weakness and  light-headedness.  Hematological: Negative for adenopathy. Does not bruise/bleed easily.  Psychiatric/Behavioral: Negative for behavioral problems, confusion, sleep disturbance, dysphoric mood, decreased concentration and agitation.       Objective:   Physical Exam  Constitutional: She is oriented to person, place, and time. She appears well-developed and well-nourished. No distress.  HENT:  Head: Normocephalic and atraumatic.  Mouth/Throat: No oropharyngeal exudate or posterior oropharyngeal erythema.  Eyes: Conjunctivae and EOM are normal. Pupils are equal, round, and reactive to light. No scleral icterus.  Neck: Normal range of motion. Neck supple. No JVD present.  Cardiovascular: Normal rate, regular rhythm and normal heart sounds.  Exam reveals no gallop and no friction rub.   No murmur heard. Pulmonary/Chest: Effort normal and breath sounds normal. No respiratory distress. She has no wheezes. She has no rales. She exhibits no tenderness.  Abdominal: She exhibits no distension and no mass. There is no tenderness. There is no rebound and no guarding.  Musculoskeletal: She exhibits no edema and no tenderness.  Lymphadenopathy:    She has no cervical adenopathy.  Neurological: She is alert and oriented to person, place, and time. She has normal reflexes. She exhibits normal muscle tone. Coordination normal.  Skin: Skin is warm and dry. She is not diaphoretic. No erythema. No pallor.  Psychiatric: She has a normal mood and affect. Her behavior is normal. Judgment and thought content normal.          Assessment & Plan:  HIV: continue Prezista Norvir and Truvada.  Dysuria: Check urinalysis and culture give three-day treatment course with Bactrim twice daily.  NHL: is about to graduate from Dr. Patsy Lager practice in one year, has been in remission for 9 years  Difficulty  voiding: apparently on flomax and being followed by Dr. Annabell Howells  Righ knee injuary sp surgery and followed  by Dr. Dion Saucier  Vitamin B 12 deficiency: will give dose today

## 2012-09-19 NOTE — Telephone Encounter (Signed)
Why can she not just get the regular combivent?

## 2012-09-19 NOTE — Addendum Note (Signed)
Addended by: Lurlean Leyden on: 09/19/2012 04:40 PM   Modules accepted: Orders

## 2012-09-20 ENCOUNTER — Telehealth: Payer: Self-pay | Admitting: Licensed Clinical Social Worker

## 2012-09-20 ENCOUNTER — Telehealth: Payer: Self-pay | Admitting: *Deleted

## 2012-09-20 LAB — URINALYSIS, ROUTINE W REFLEX MICROSCOPIC
Nitrite: NEGATIVE
Specific Gravity, Urine: <1.005 — ABNORMAL LOW (ref 1.005–1.030)
Urobilinogen, UA: 0.2 mg/dL (ref 0.0–1.0)
pH: 7 (ref 5.0–8.0)

## 2012-09-20 LAB — URINALYSIS, MICROSCOPIC ONLY
Bacteria, UA: NONE SEEN
Casts: NONE SEEN

## 2012-09-20 NOTE — Telephone Encounter (Signed)
Pt calls and states she needs med for restless leg syndrome, she states her urologist does not want her to use benadryl due to the fact she uses flomax, you may call her at [phone]

## 2012-09-20 NOTE — Telephone Encounter (Signed)
Patient would like to get a prescription for b-12 injectable to administer herself at home so she won't have to come to [place] every month. Please advise

## 2012-09-20 NOTE — Telephone Encounter (Signed)
I spoke to her this evening on the telephone. I do not recommend any additional medications at this time.  She is on Xanax and benzo's are also indicated for treatment of restless leg syndrome.  Again, polypharmacy is a BIG issue with [name] and I strongly believe that less is more for her.  I discussed with her my position and current plan and she is in agreement and we will continue discussions on future visits.  We will not fill any requests for mirapex any longer.    Additionally, as I have been trying to taper her xanax dose slowly.  I may transition her to long acting Klonopin in the future.    Discussed with Dr. Kem Kays

## 2012-09-20 NOTE — Telephone Encounter (Signed)
Ok I will let her know 

## 2012-09-20 NOTE — Telephone Encounter (Signed)
That is fine as long as she can be taught how to do this

## 2012-09-22 LAB — URINE CULTURE: Colony Count: 30000

## 2012-10-03 ENCOUNTER — Ambulatory Visit (INDEPENDENT_AMBULATORY_CARE_PROVIDER_SITE_OTHER): Payer: Medicaid Other | Admitting: Neurology

## 2012-10-03 ENCOUNTER — Encounter: Payer: Self-pay | Admitting: Neurology

## 2012-10-03 VITALS — Wt 245.0 lb

## 2012-10-03 DIAGNOSIS — C8589 Other specified types of non-Hodgkin lymphoma, extranodal and solid organ sites: Secondary | ICD-10-CM

## 2012-10-03 DIAGNOSIS — F329 Major depressive disorder, single episode, unspecified: Secondary | ICD-10-CM

## 2012-10-03 DIAGNOSIS — B20 Human immunodeficiency virus [HIV] disease: Secondary | ICD-10-CM

## 2012-10-03 DIAGNOSIS — F3289 Other specified depressive episodes: Secondary | ICD-10-CM

## 2012-10-03 DIAGNOSIS — G894 Chronic pain syndrome: Secondary | ICD-10-CM

## 2012-10-03 DIAGNOSIS — G43909 Migraine, unspecified, not intractable, without status migrainosus: Secondary | ICD-10-CM

## 2012-10-03 DIAGNOSIS — C8590 Non-Hodgkin lymphoma, unspecified, unspecified site: Secondary | ICD-10-CM

## 2012-10-03 MED ORDER — ONABOTULINUMTOXINA 100 UNITS IJ SOLR
200.0000 [IU] | Freq: Once | INTRAMUSCULAR | Status: AC
  Administered 2012-10-03: 200 [IU] via INTRAMUSCULAR

## 2012-10-03 NOTE — Progress Notes (Signed)
HPI: 47 year old Caucasian female with past medical history of HIV, high cholesterol, migraines, depression, and non-Hodgkin lymphoma in 2005 with chemotherapy and radiation.   She has history of migraines since 19. In the last 10 years, she reports having a migraines daily. She is currently taking Topamax and Zomig with relief. She takes approximately 20-25 tablets of her Zomig per month.    She has aura's that look like floating dirt particles, flashing lights, and she sees Christmas tree lights when they are severe, she has nausea, vomiting and sometimes diarrhea when they're severe. She also complains of diplopia, blurry vision, and  photophobia. She also has had a lightning bolt sensation throughout her body.  She describes them as pulsating and throbbing  on a scale of 5-6/10 which she considers mild, when they are severe they can go as high as 10/10. They usually start in the right temporal area, but will also start on the left temporal and are worse.   Any type of smoke or MSG can precipitate a migraine. They generally last 2-9 days. She has been to the emergency department twice in the last year for migraines. She states "I just can't do this anymore I need to do something about her migraines"  She has tried in the past Maxalt, Imitrex, atenolol, verapamil, nortriptyline, Inderal, Depakote without benefit. Atenolol and verapamil caused hypotension. Depakote caused severe vomiting. Topamax causing her worsening headaches, I also put her on Zonegran, she denies significant improvement.  Update April second 2014: Last injection was December 2013, she did very well, which help her headache at best 50% of time, without injection, she has almost daily headaches, now she has headaches 3-4 times each week, she been using Zomig as needed based,   Physical Exam  General: Well groomed and obese female Neck: supple no carotid bruits Respiratory: clear to auscultation bilaterally Cardiovascular:  regular rate rhythm She wear right knee brace  Neurologic Exam  Mental Status: obese, pleasant, awake, alert, cooperative to history, talking, and casual conversation. Cranial Nerves: CN II-XII pupils were equal round reactive to light. Extraocular movements were full.  Visual fields were full on confrontational test.  Facial sensation and strength were normal.   Uvula tongue were midline.  Head turning and shoulder shrugging were normal and symmetric.  Tongue protrusion into the cheeks strength were normal.  Motor: Normal tone, bulk, and strength. Sensory: Normal to light touch, pinprick, proprioception except decreased vibratory sensation to her right lower extremity she relates to peripheral neuropathy.  Decreased vibratory sensation to left upper extremity.   Coordination: There was no dysmetria noticed. Gait and Station: Narrow based and steady,  Reflexes: Deep tendon reflexes: present and symmetric.  Plantar responses are flexor.   Assessment and plan  47 year old Caucasian female with a history of HIV, hypercholesterolemia, Non hodgkin' lymphoma with chemotherapy and radiation and migraines since age 47.   She has tried and failed multiple preventive medications including atenolol, verapamil Maxalt, Imitrex, nortriptyline, and Depakote,Topamax,   She has migraines daily for the last 10 years. Currently taking  zonogram  and Zomig with some relief.   Normal MRI brain, and neurological exam.   BOTOX injection was performed according to protocol by [employer].  was exception of skipping injection to frontalis this time, because of decreased wrinkle frontalis muscle movement  100 units of BOTOX was dissolved into 2 cc NS. (Lot [id number], exp Oct 2016)  Total of 150 units, discard 50 units.    Temporalis 8 sites,  40 units  Occipitalis 6 sites, 30 units Cervical Paraspinal, 8 sites, 40 units Trapezius, 8 sites, 40 units  Patient tolerate the injection well. Will return for repeat  injection in 3 months.

## 2012-10-16 ENCOUNTER — Ambulatory Visit: Payer: Medicaid Other | Admitting: Sports Medicine

## 2012-10-18 ENCOUNTER — Other Ambulatory Visit: Payer: Self-pay | Admitting: *Deleted

## 2012-10-18 DIAGNOSIS — R05 Cough: Secondary | ICD-10-CM

## 2012-10-18 MED ORDER — DICLOFENAC SODIUM 1 % TD GEL: TRANSDERMAL | Status: DC

## 2012-10-18 MED ORDER — IPRATROPIUM-ALBUTEROL 18-103 MCG/ACT IN AERO: 2.0000 | INHALATION_SPRAY | Freq: Four times a day (QID) | RESPIRATORY_TRACT | Status: DC | PRN

## 2012-10-19 ENCOUNTER — Ambulatory Visit: Payer: Medicaid Other | Admitting: Sports Medicine

## 2012-11-14 ENCOUNTER — Ambulatory Visit (INDEPENDENT_AMBULATORY_CARE_PROVIDER_SITE_OTHER): Payer: Medicaid Other | Admitting: Sports Medicine

## 2012-11-14 VITALS — BP 110/70 | Ht 66.0 in | Wt 230.0 lb

## 2012-11-14 DIAGNOSIS — F419 Anxiety disorder, unspecified: Secondary | ICD-10-CM

## 2012-11-14 DIAGNOSIS — M4310 Spondylolisthesis, site unspecified: Secondary | ICD-10-CM

## 2012-11-14 DIAGNOSIS — Z5189 Encounter for other specified aftercare: Secondary | ICD-10-CM

## 2012-11-14 DIAGNOSIS — Q762 Congenital spondylolisthesis: Secondary | ICD-10-CM

## 2012-11-14 DIAGNOSIS — F411 Generalized anxiety disorder: Secondary | ICD-10-CM

## 2012-11-14 DIAGNOSIS — S134XXD Sprain of ligaments of cervical spine, subsequent encounter: Secondary | ICD-10-CM

## 2012-11-14 MED ORDER — AMITRIPTYLINE HCL 50 MG PO TABS: 100.0000 mg | ORAL_TABLET | Freq: Every day | ORAL | Status: DC

## 2012-11-14 MED ORDER — AMITRIPTYLINE HCL 50 MG PO TABS: 50.0000 mg | ORAL_TABLET | Freq: Every day | ORAL | Status: DC

## 2012-11-14 MED ORDER — DICLOFENAC SODIUM 1 % TD GEL: TRANSDERMAL | Status: DC

## 2012-11-14 MED ORDER — CEPHALEXIN 500 MG PO CAPS: 500.0000 mg | ORAL_CAPSULE | Freq: Two times a day (BID) | ORAL | Status: DC

## 2012-11-14 NOTE — Assessment & Plan Note (Signed)
She takes Xanax for what sound like true panic attacks  I think this may also be helpful for her in reducing her low back spasm  However, she still has restless leg type symptoms at night These could be related to her low back  Her primary doctor might consider switching to Klonopin and if these continue as it has a little longer half life and might help with both the anxiety and the restless legs symptoms
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
## 2012-11-14 NOTE — Patient Instructions (Addendum)
Increase amitriptyline 75 mg for 2 weeks- if you do not have problems with the increase and still having symptoms increase to amitriptyline 100 mg at bedtime  Continue doing exercises for low back  Stop forward thrusts for your neck  Concentrate of neck motion  Upper chest rows   Please follow up as needed  Thank you for seeing [place] today!

## 2012-11-15 ENCOUNTER — Other Ambulatory Visit: Payer: Self-pay | Admitting: Internal Medicine

## 2012-11-15 NOTE — Telephone Encounter (Signed)
What does she need this for?

## 2012-11-15 NOTE — Telephone Encounter (Signed)
Pt was called - no answer; message left to call the clinic.

## 2012-11-16 NOTE — Telephone Encounter (Signed)
Pt called again - no answer; message left. 

## 2012-12-11 ENCOUNTER — Encounter: Payer: Self-pay | Admitting: Internal Medicine

## 2012-12-11 ENCOUNTER — Ambulatory Visit (INDEPENDENT_AMBULATORY_CARE_PROVIDER_SITE_OTHER): Payer: Medicaid Other | Admitting: Internal Medicine

## 2012-12-11 VITALS — BP 128/90 | HR 103 | Temp 97.1°F | Ht 65.0 in | Wt 239.6 lb

## 2012-12-11 DIAGNOSIS — F411 Generalized anxiety disorder: Secondary | ICD-10-CM

## 2012-12-11 DIAGNOSIS — Z79899 Other long term (current) drug therapy: Secondary | ICD-10-CM

## 2012-12-11 DIAGNOSIS — F419 Anxiety disorder, unspecified: Secondary | ICD-10-CM

## 2012-12-11 DIAGNOSIS — G894 Chronic pain syndrome: Secondary | ICD-10-CM

## 2012-12-11 MED ORDER — OXYCODONE-ACETAMINOPHEN 5-325 MG PO TABS: 1.0000 | ORAL_TABLET | Freq: Four times a day (QID) | ORAL | Status: DC | PRN

## 2012-12-11 MED ORDER — OXYCODONE-ACETAMINOPHEN 5-325 MG PO TABS: 1.0000 | ORAL_TABLET | ORAL | Status: DC | PRN

## 2012-12-11 MED ORDER — CLONAZEPAM 0.25 MG PO TBDP: 0.2500 mg | ORAL_TABLET | Freq: Two times a day (BID) | ORAL | Status: DC | PRN

## 2012-12-11 NOTE — Patient Instructions (Addendum)
General Instructions:  We have switched you to klonopin today  We have refilled your medications per pain contract for three months until September 2014  Continue to follow up with your specialist appointments  Continue to work on weight loss and diet  Treatment Goals:  Goals (1 Years of Data) as of 12/11/12   None      Progress Toward Treatment Goals:    Self Care Goals & Plans:       Care Management & Community Referrals:      Clonazepam tablets What is this medicine? CLONAZEPAM (kloe NA ze pam) is a benzodiazepine. It is used to treat certain types of seizures. It is also used to treat panic disorder. This medicine may be used for other purposes; ask your health care provider or pharmacist if you have questions. What should I tell my health care provider before I take this medicine? They need to know if you have any of these conditions: -an alcohol or drug abuse problem -bipolar disorder, depression, psychosis or other mental health condition -glaucoma -kidney or liver disease -lung or breathing disease -myasthenia gravis -Parkinson's disease -seizures or a history of seizures -suicidal thoughts -an unusual or allergic reaction to clonazepam, other benzodiazepines, foods, dyes, or preservatives -pregnant or trying to get pregnant -breast-feeding How should I use this medicine? Take this medicine by mouth with a glass of water. Follow the directions on the prescription label. If it upsets your stomach, take it with food or milk. Take your medicine at regular intervals. Do not take it more often than directed. Do not stop taking or change the dose except on the advice of your doctor or health care professional. A special MedGuide will be given to you by the pharmacist with each prescription and refill. Be sure to read this information carefully each time. Talk to your pediatrician regarding the use of this medicine in children. Special care may be  needed. Overdosage: If you think you have taken too much of this medicine contact a poison control center or emergency room at once. NOTE: This medicine is only for you. Do not share this medicine with others. What if I miss a dose? If you miss a dose, take it as soon as you can. If it is almost time for your next dose, take only that dose. Do not take double or extra doses. What may interact with this medicine? -herbal or dietary supplements -medicines for depression, anxiety, or psychotic disturbances -medicines for fungal infections like fluconazole, itraconazole, ketoconazole, voriconazole -medicines for HIV infection or AIDS -medicines for sleep -prescription pain medicines -propantheline -rifampin -sevelamer -some medicines for seizures like carbamazepine, phenobarbital, phenytoin, primidone This list may not describe all possible interactions. Give your health care provider a list of all the medicines, herbs, non-prescription drugs, or dietary supplements you use. Also tell them if you smoke, drink alcohol, or use illegal drugs. Some items may interact with your medicine. What should I watch for while using this medicine? Visit your doctor or health care professional for regular checks on your progress. Your body may become dependent on this medicine. If you have been taking this medicine regularly for some time, do not suddenly stop taking it. You must gradually reduce the dose or you may get severe side effects. Ask your doctor or health care professional for advice before increasing or decreasing the dose. Even after you stop taking this medicine it can still affect your body for several days. If you suffer from several types of seizures,  this medicine may increase the chance of grand mal seizures (epilepsy). Let your doctor or health care professional know, he or she may want to prescribe an additional medicine. You may get drowsy or dizzy. Do not drive, use machinery, or do anything  that needs mental alertness until you know how this medicine affects you. To reduce the risk of dizzy and fainting spells, do not stand or sit up quickly, especially if you are an older patient. Alcohol may increase dizziness and drowsiness. Avoid alcoholic drinks. Do not treat yourself for coughs, colds or allergies without asking your doctor or health care professional for advice. Some ingredients can increase possible side effects. The use of this medicine may increase the chance of suicidal thoughts or actions. Pay special attention to how you are responding while on this medicine. Any worsening of mood, or thoughts of suicide or dying should be reported to your health care professional right away. Women who become pregnant while using this medicine may enroll in the [place] American Antiepileptic Drug Pregnancy Registry by calling [phone]. This registry collects information about the safety of antiepileptic drug use during pregnancy. What side effects may I notice from receiving this medicine? Side effects that you should report to your doctor or health care professional as soon as possible: -allergic reactions like skin rash, itching or hives, swelling of the face, lips, or tongue -changes in vision -confusion -depression -hallucinations -mood changes, excitability or aggressive behavior -movement difficulty, staggering or jerky movements -muscle cramps, weakness -tremors -unusual eye movements Side effects that usually do not require medical attention (report to your doctor or health care professional if they continue or are bothersome): -constipation or diarrhea -difficulty sleeping, nightmares -dizziness, drowsiness -headache -increased saliva from your mouth -nausea, vomiting This list may not describe all possible side effects. Call your doctor for medical advice about side effects. You may report side effects to FDA at 1-800-FDA-1088. Where should I keep my medicine? Keep  out of the reach of children. This medicine can be abused. Keep your medicine in a safe place to protect it from theft. Do not share this medicine with anyone. Selling or giving away this medicine is dangerous and against the law. Store at room temperature between 15 and 30 degrees C (59 and 86 degrees F). Protect from light. Keep container tightly closed. Throw away any unused medicine after the expiration date. NOTE: This sheet is a summary. It may not cover all possible information. If you have questions about this medicine, talk to your doctor, pharmacist, or health care provider.  2013, [employer]vier/[employer] Standard. (05/15/2009 7:16:36 PM)

## 2012-12-12 MED ORDER — OXYCODONE-ACETAMINOPHEN 5-325 MG PO TABS: 1.0000 | ORAL_TABLET | Freq: Four times a day (QID) | ORAL | Status: DC | PRN

## 2012-12-12 MED ORDER — OXYCODONE-ACETAMINOPHEN 5-325 MG PO TABS
1.0000 | ORAL_TABLET | Freq: Four times a day (QID) | ORAL | Status: DC | PRN
Start: 2013-02-17 — End: 2012-12-18

## 2012-12-12 NOTE — Assessment & Plan Note (Signed)
Continuing to taper medications.  Discontinued xanax and transitioned to klonopin for more long term control.  Discussed transitioning percocet to q8h. Needs referral to pain clinic at this time for improved long term pain control that will be most effective.  She is in agreement for referral today.  -pain clinic referral -klonopin -percocet per pain contract
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
## 2012-12-12 NOTE — Assessment & Plan Note (Addendum)
Discontinued xanax today and transitioned to Klonopin today. Longer half-life and will hopefully help with her restless legs as well. Counseled on and reviewed side effects.  Hx of migraine's with Valium but has had no issues with xanax and would like to try klonopin today.  Had migraine's without valium in the past as well but claims the valium triggered them more.    -klonopin 0.25mg  po bid
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
## 2012-12-14 NOTE — Progress Notes (Signed)
Case discussed with Dr. Virgina Organ at the time of visit. We reviewed the resident's history and exam and pertinent patient test results. I agree with the assessment, diagnosis and plan of care documented in the resident's note.

## 2012-12-17 ENCOUNTER — Telehealth: Payer: Self-pay | Admitting: *Deleted

## 2012-12-17 NOTE — Telephone Encounter (Signed)
I talked to [name] at [facility] - pt will need new prescriptions for qty #120 and he will remove the ones for qty#30 from their system. Cannot accept verbal order.

## 2012-12-17 NOTE — Telephone Encounter (Signed)
I talked to pt about picking up new rxs tomorrow per Dr Virgina Organ. She agreed; stated she will go to pharmacy and bring back the old ones if the pharmacist will give them to her.

## 2012-12-17 NOTE — Telephone Encounter (Signed)
Message copied by Hassan Buckler on Mon Dec 17, 2012 12:30 PM ------      Message from: Baltazar Apo      Created: Wed Dec 12, 2012  7:27 PM      Regarding: need to adjust percocet dose       Dear ladies,            I believe Rivka Barbara is on vacation but i recently saw Ms. Blumenthal in clinic and accidentally did not give enough quantity for her percocet.  Can we please call her pharmacy and adjust quantity for June and July 17 prescriptions to 120.  And also, August 17 should be q6 hours with quantity of 120; i made the change to the medications in the chart and made it no print.             Thank you so much,            Dr q ------

## 2012-12-17 NOTE — Telephone Encounter (Signed)
Oh okay,  Please let her know to come back for her new prescriptions.  Please cancel the old prescriptions.  Thanks,  Dr. Jannet Mantis

## 2012-12-18 MED ORDER — OXYCODONE-ACETAMINOPHEN 5-325 MG PO TABS: 1.0000 | ORAL_TABLET | Freq: Four times a day (QID) | ORAL | Status: DC | PRN

## 2012-12-18 NOTE — Addendum Note (Signed)
Addended by: Baltazar Apo on: 12/18/2012 01:21 PM   Modules accepted: Orders

## 2012-12-19 ENCOUNTER — Other Ambulatory Visit: Payer: Self-pay | Admitting: Internal Medicine

## 2012-12-19 ENCOUNTER — Other Ambulatory Visit: Payer: Self-pay | Admitting: Infectious Disease

## 2012-12-26 ENCOUNTER — Ambulatory Visit (INDEPENDENT_AMBULATORY_CARE_PROVIDER_SITE_OTHER): Payer: Medicaid Other | Admitting: Sports Medicine

## 2012-12-26 ENCOUNTER — Encounter: Payer: Self-pay | Admitting: Physical Medicine & Rehabilitation

## 2012-12-26 ENCOUNTER — Encounter: Payer: Self-pay | Admitting: Sports Medicine

## 2012-12-26 VITALS — BP 128/82 | Ht 66.0 in | Wt 240.0 lb

## 2012-12-26 DIAGNOSIS — S134XXS Sprain of ligaments of cervical spine, sequela: Secondary | ICD-10-CM

## 2012-12-26 DIAGNOSIS — IMO0002 Reserved for concepts with insufficient information to code with codable children: Secondary | ICD-10-CM

## 2012-12-26 NOTE — Assessment & Plan Note (Signed)
Patient likely did have chronic ligamentous injuries to the neck and does have some mild hypermobility overall. I think patient though is starting to have more radiculopathy and concerning for maybe I nerve impingement. We did try trapezius trigger point injections today with mild improvement. Patient was also given a soft collar that she will wear for 30-60 minutes multiple times a day to try to give her neck to rest. Patient will continue physical therapy and attempting traction. We'll have patient stop the amitriptyline secondary to her urinary retention. Would like patient to go up on her Klonopin and we'll attempt to get into contact with the pain management Dr. to help. Patient will try these interventions and come back again in 6-8 weeks for further evaluation. At that time if she continues to have pain we may consider getting an MRI to see if epidural steroid injections could be beneficial.
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
## 2012-12-26 NOTE — Patient Instructions (Addendum)
Lets have you decrease your amitriptyline back to 50mg  nightly for one week, then 3 times a week for 1 week then stop it.  We did try the trigger point injections.  I hope these help We will get you a soft collar for your neck and see if it improves.  Try increasing your klonopin to 2 pills and see if this helps your neck. Go back to one pill if it triggers your migraines.  Continue physical therapy if you still have some visits available.  Discuss your lipoma with Dr. Dion Saucier as well and see if he has any ideas. Come back again in 6-8 weeks.

## 2012-12-28 ENCOUNTER — Telehealth: Payer: Self-pay | Admitting: Internal Medicine

## 2012-12-28 NOTE — Telephone Encounter (Signed)
Called [name] in regards to her recent visit with Dr. Darrick Penna.  No answer, left a message to call back on Monday. Will then discuss possible adjusting her klonopin dose if needed.

## 2013-01-02 ENCOUNTER — Ambulatory Visit (INDEPENDENT_AMBULATORY_CARE_PROVIDER_SITE_OTHER): Payer: Medicaid Other | Admitting: Neurology

## 2013-01-02 ENCOUNTER — Encounter: Payer: Self-pay | Admitting: Neurology

## 2013-01-02 VITALS — Ht 65.0 in | Wt 237.0 lb

## 2013-01-02 DIAGNOSIS — G43909 Migraine, unspecified, not intractable, without status migrainosus: Secondary | ICD-10-CM

## 2013-01-02 DIAGNOSIS — G43719 Chronic migraine without aura, intractable, without status migrainosus: Secondary | ICD-10-CM

## 2013-01-02 MED ORDER — ONABOTULINUMTOXINA 100 UNITS IJ SOLR
150.0000 [IU] | Freq: Once | INTRAMUSCULAR | Status: AC
  Administered 2013-01-02: 150 [IU] via INTRAMUSCULAR

## 2013-01-02 NOTE — Progress Notes (Signed)
HPI: 47 year old Caucasian female with past medical history of HIV, high cholesterol, migraines, depression, and non-Hodgkin lymphoma in 2005 with chemotherapy and radiation.   She has history of migraines since 19. In the last 10 years, she reports having a migraines daily. She is currently taking Topamax and Zomig with relief. She takes approximately 20-25 tablets of her Zomig per month.    She has aura's that look like floating dirt particles, flashing lights, and she sees Christmas tree lights when they are severe, she has nausea, vomiting and sometimes diarrhea when they're severe. She also complains of diplopia, blurry vision, and  photophobia. She also has had a lightning bolt sensation throughout her body.  She describes them as pulsating and throbbing  on a scale of 5-6/10 which she considers mild, when they are severe they can go as high as 10/10. They usually start in the right temporal area, but will also start on the left temporal and are worse.   Any type of smoke or MSG can precipitate a migraine. They generally last 2-9 days. She has been to the emergency department twice in the last year for migraines. She states "I just can't do this anymore I need to do something about her migraines"  She has tried in the past Maxalt, Imitrex, atenolol, verapamil, nortriptyline, Inderal, Depakote without benefit. Atenolol and verapamil caused hypotension. Depakote caused severe vomiting. Topamax causing her worsening headaches, I also put her on Zonegran, she denies significant improvement.  Update [birth year] 2[birth year]: Last injection was April 2014, she did very well, which help her headache at best 50% of time, without injection, she has almost daily headaches, now she has headaches 3-4 times each week, she been using Zomig as needed based, office is providing paperwork to expand the allowance   Physical Exam  General: Well groomed and obese female Neck: supple no carotid bruits Respiratory: clear to  auscultation bilaterally Cardiovascular: regular rate rhythm She wear right knee brace  Neurologic Exam  Mental Status: obese, pleasant, awake, alert, cooperative to history, talking, and casual conversation. Cranial Nerves: CN II-XII pupils were equal round reactive to light. Extraocular movements were full.  Visual fields were full on confrontational test.  Facial sensation and strength were normal.   Uvula tongue were midline.  Head turning and shoulder shrugging were normal and symmetric.  Tongue protrusion into the cheeks strength were normal.  Motor: Normal tone, bulk, and strength. Sensory: Normal to light touch, pinprick, proprioception except decreased vibratory sensation to her right lower extremity she relates to peripheral neuropathy.  Decreased vibratory sensation to left upper extremity.   Coordination: There was no dysmetria noticed. Gait and Station: Narrow based and steady,  Reflexes: Deep tendon reflexes: present and symmetric.  Plantar responses are flexor.   Assessment and plan  47 year old Caucasian female with a history of HIV, hypercholesterolemia, Non hodgkin' lymphoma with chemotherapy and radiation and migraines since age 47.   She has tried and failed multiple preventive medications including atenolol, verapamil Maxalt, Imitrex, nortriptyline, and Depakote,Topamax,   She has migraines daily for the last 10 years. Currently taking  zonogram  and Zomig with some relief.   Normal MRI brain, and neurological exam.   BOTOX injection was performed according to protocol by [employer].  was exception of skipping injection to frontalis this time, because of decreased wrinkle frontalis muscle movement  100 units of BOTOX was dissolved into 2 cc NS. (Lot [id number], exp Jan 2017)  Total of 150 units,    Frontalis 4  site 20 units Temporalis 8 sites,  40 units  Occipitalis 6 sites, 30 units Cervical Paraspinal, 6 sites, 30 units Trapezius, 6 sites, 30 units  Patient tolerate  the injection well. Will return for repeat injection in 3 months.

## 2013-01-07 ENCOUNTER — Other Ambulatory Visit: Payer: Self-pay | Admitting: Orthopedic Surgery

## 2013-01-07 DIAGNOSIS — R2231 Localized swelling, mass and lump, right upper limb: Secondary | ICD-10-CM

## 2013-01-09 ENCOUNTER — Other Ambulatory Visit: Payer: Self-pay | Admitting: Internal Medicine

## 2013-01-13 ENCOUNTER — Ambulatory Visit
Admission: RE | Admit: 2013-01-13 | Discharge: 2013-01-13 | Disposition: A | Discharge status: Home / Self Care | Payer: Medicaid Other | Source: Ambulatory Visit | Attending: Orthopedic Surgery | Admitting: Orthopedic Surgery

## 2013-01-13 DIAGNOSIS — R2231 Localized swelling, mass and lump, right upper limb: Secondary | ICD-10-CM

## 2013-01-13 MED ORDER — GADOBENATE DIMEGLUMINE 529 MG/ML IV SOLN
20.0000 mL | Freq: Once | INTRAVENOUS | Status: AC | PRN
  Administered 2013-01-13: 20 mL via INTRAVENOUS

## 2013-01-25 ENCOUNTER — Telehealth: Payer: Self-pay | Admitting: *Deleted

## 2013-01-25 NOTE — Telephone Encounter (Signed)
Pt called to reminded her of Pain Clinic appt on Monday 7/28 per Dr Virgina Organ.; no one home - message left.

## 2013-01-28 ENCOUNTER — Encounter: Payer: Medicaid Other | Attending: Physical Medicine & Rehabilitation

## 2013-01-28 ENCOUNTER — Ambulatory Visit (HOSPITAL_BASED_OUTPATIENT_CLINIC_OR_DEPARTMENT_OTHER): Payer: Medicaid Other | Admitting: Physical Medicine & Rehabilitation

## 2013-01-28 ENCOUNTER — Encounter: Payer: Self-pay | Admitting: Physical Medicine & Rehabilitation

## 2013-01-28 VITALS — HR 88 | Resp 16 | Ht 66.0 in | Wt 245.0 lb

## 2013-01-28 DIAGNOSIS — Z79899 Other long term (current) drug therapy: Secondary | ICD-10-CM | POA: Insufficient documentation

## 2013-01-28 DIAGNOSIS — Z5181 Encounter for therapeutic drug level monitoring: Secondary | ICD-10-CM

## 2013-01-28 DIAGNOSIS — M542 Cervicalgia: Secondary | ICD-10-CM | POA: Insufficient documentation

## 2013-01-28 DIAGNOSIS — M4310 Spondylolisthesis, site unspecified: Secondary | ICD-10-CM

## 2013-01-28 DIAGNOSIS — M549 Dorsalgia, unspecified: Secondary | ICD-10-CM

## 2013-01-28 DIAGNOSIS — M4307 Spondylolysis, lumbosacral region: Secondary | ICD-10-CM

## 2013-01-28 DIAGNOSIS — M25569 Pain in unspecified knee: Secondary | ICD-10-CM | POA: Insufficient documentation

## 2013-01-28 DIAGNOSIS — G894 Chronic pain syndrome: Secondary | ICD-10-CM

## 2013-01-28 DIAGNOSIS — G8929 Other chronic pain: Secondary | ICD-10-CM | POA: Insufficient documentation

## 2013-01-28 DIAGNOSIS — Z86718 Personal history of other venous thrombosis and embolism: Secondary | ICD-10-CM | POA: Insufficient documentation

## 2013-01-28 DIAGNOSIS — C8589 Other specified types of non-Hodgkin lymphoma, extranodal and solid organ sites: Secondary | ICD-10-CM | POA: Insufficient documentation

## 2013-01-28 DIAGNOSIS — M79609 Pain in unspecified limb: Secondary | ICD-10-CM | POA: Insufficient documentation

## 2013-01-28 NOTE — Patient Instructions (Addendum)
If you decide the like to try lumbar injections please call my office so we can order an MRI. Last MRI was 2008 Epidural Steroid Injection An epidural steroid injection is given to relieve pain in the neck, back, or legs. This procedure involves injecting a steroid and numbing medicine (local anesthetic) into the epidural space. The epidural space is the space between the outer covering of the spinal cord and the vertebra. The epidural steroid injection helps in reducing the pain that is caused by the irritation or swelling of the nerve root. However, it does not cure the underlying problem. The injection may be given for the following conditions:  Changes in the soft, gel-like cushion between two vertebrae (disk) due to wear and tear.  A reduction in the space within the spinal canal.  Slipped or herniated disk.  Low back (lumbar) sprain.  Sciatica. This is shooting pain that radiates down the buttocks and the back of the leg due to compression of the nerve.  Traumatic compression fracture of the vertebra.  Pain that develops after a surgery of the spine.  Pain that arises after an attack of viral infection affecting the nerves (shingles). LET YOUR CAREGIVER KNOW ABOUT:   Allergies to food or medicine.  Medicines taken, including vitamins, herbs, eyedrops, over-the-counter medicines, and creams.  Use of steroids (by mouth or creams).  Previous problems with anesthetics or numbing medicines.  History of bleeding problems or blood clots.  Previous surgery.  Other health problems, including diabetes and kidney problems.  Possibility of pregnancy, if this applies.     RISKS AND COMPLICATIONS The complications due to the needle insertion are:  Headache.  Bleeding.  Infection.  Allergic reaction to the medicines.  Damage to the nerves. The complications due to the steroid are:  Weight gain.  Hot flashes.  Mood swings.  Lack of sleep.  Increase in blood sugar  levels, especially if you are diabetic.  Retention of water. The response to this procedure depends on the underlying cause of the pain and its duration. Patients who have long-term (chronic) pain are less likely to benefit from epidural steroids than are patients whose pain comes on strong and suddenly. BEFORE THE PROCEDURE   The caregiver may ask about your symptoms, do a detailed exam, and advise some tests. These tests may include imaging studies. Your caregiver may review the results of your tests and discuss the procedure with you.  Ask your caregiver about changing or stopping your regular medicines. You may be advised to stop taking blood-thinning medicines a few days before the procedure.  You may also be given medicines to reduce your anxiety. PROCEDURE  You will remain awake during the whole procedure. Although, you may receive medicine to make you sleepy. You will be asked to lie on your stomach. The site of the injection is cleansed. Then, the injection site is numbed using a small amount of medicine that numbs the area (local anesthetic). A hollow needle is directed through your skin into the epidural space with the help of an X-ray. The X-ray helps to ensure that the steroid is delivered closest to the affected nerve. You may have some minimal discomfort at this time. Once the needle is in the right position, the local anesthetic and the steroid are injected into the epidural space. The needle is then removed. The skin is cleaned and a bandage is applied. The entire procedure takes only a few minutes, although repeated injections may be required (up to 3 to  4 injections over several weeks).  AFTER THE PROCEDURE   You may be monitored for a short time before you go home.  You may feel weakness or numbness in your arm or leg, which disappears within 1 to 2 hours.  Someone must drive you home or accompany you home if you are taking a taxi.  You may be allowed to eat, drink, and take  your regular medicine.  Your pain may improve or worsen right after the procedure.  You may feel the beneficial effect of the steroid a few days later.  You may have soreness at the site of the injection.  If you have only partial relief of the pain, the injection may be repeated once or even twice within 4 to 8 weeks of the initial injection. Document Released: 09/27/2007 Document Revised: 09/12/2011 Document Reviewed: 10/30/2008 [facility] Patient Information 2014 [place], [place].
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
## 2013-02-06 ENCOUNTER — Encounter: Payer: Self-pay | Admitting: Sports Medicine

## 2013-02-06 ENCOUNTER — Ambulatory Visit (INDEPENDENT_AMBULATORY_CARE_PROVIDER_SITE_OTHER): Payer: Medicaid Other | Admitting: Sports Medicine

## 2013-02-06 ENCOUNTER — Other Ambulatory Visit: Payer: Self-pay

## 2013-02-06 VITALS — BP 110/76 | HR 89 | Ht 66.0 in | Wt 245.0 lb

## 2013-02-06 DIAGNOSIS — M542 Cervicalgia: Secondary | ICD-10-CM

## 2013-02-06 DIAGNOSIS — M62838 Other muscle spasm: Secondary | ICD-10-CM

## 2013-02-06 MED ORDER — ZOLMITRIPTAN 5 MG PO TABS: 5.0000 mg | ORAL_TABLET | ORAL | Status: DC | PRN

## 2013-02-06 MED ORDER — CLONAZEPAM 0.5 MG PO TABS: ORAL_TABLET | ORAL | Status: DC

## 2013-02-06 NOTE — Patient Instructions (Addendum)
Thank you for coming in today Please continue your cymbalta Increase your clonazepam to 0.5 mg 3 x per day Work on making sure your posture is good and work on keeping your chin over your chest. Continue your exercises at therapy.
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
## 2013-02-08 ENCOUNTER — Other Ambulatory Visit: Payer: Self-pay | Admitting: Internal Medicine

## 2013-02-08 ENCOUNTER — Other Ambulatory Visit: Payer: Self-pay | Admitting: *Deleted

## 2013-02-08 DIAGNOSIS — R05 Cough: Secondary | ICD-10-CM

## 2013-02-08 DIAGNOSIS — R059 Cough, unspecified: Secondary | ICD-10-CM

## 2013-02-11 ENCOUNTER — Other Ambulatory Visit: Payer: Self-pay | Admitting: *Deleted

## 2013-02-11 MED ORDER — IPRATROPIUM-ALBUTEROL 18-103 MCG/ACT IN AERO: 2.0000 | INHALATION_SPRAY | Freq: Four times a day (QID) | RESPIRATORY_TRACT | Status: DC | PRN

## 2013-02-11 NOTE — Telephone Encounter (Signed)
I called the pharmacy and this was last ordered 7 / 2013.  She is due for a refill now. She was getting a 3 month supply.

## 2013-02-11 NOTE — Telephone Encounter (Signed)
PLEASE DISREGARD PRIOR DOCUMENTATION ON REFILL REFUSAL ON 02/11/13 AT 11:55AM.  ENTERED IN ERROR. CANNOT FIND ENCOUNTER TO DELETE.  DR. Jannet Mantis

## 2013-02-11 NOTE — Telephone Encounter (Signed)
Her cymbalta was refilled 01/2013 for 90 day supply and thus she should have plenty left over and she also has 1 refill. i am not sure what the other medication refill request was for as i think i accidentally closed both.  Can you please re-send.  Thanks,

## 2013-02-11 NOTE — Telephone Encounter (Signed)
i am not going to refill this at this time since it has not been refilled since February. i did speak with her on the phone and there is risk of serotonin syndrome with flexeril plus her anti-depressant.   

## 2013-02-11 NOTE — Telephone Encounter (Signed)
This was filled on 02/06/13 per epic.

## 2013-02-11 NOTE — Telephone Encounter (Signed)
Dr. Neomia Dear ordered her cymbalta on 02/06/13

## 2013-03-07 ENCOUNTER — Ambulatory Visit: Payer: Medicaid Other | Admitting: Sports Medicine

## 2013-03-07 ENCOUNTER — Ambulatory Visit (INDEPENDENT_AMBULATORY_CARE_PROVIDER_SITE_OTHER): Payer: Medicaid Other | Admitting: Obstetrics & Gynecology

## 2013-03-07 ENCOUNTER — Other Ambulatory Visit: Payer: Medicaid Other

## 2013-03-07 VITALS — BP 135/87 | HR 85 | Temp 98.8°F | Wt 244.0 lb

## 2013-03-07 DIAGNOSIS — B3731 Acute candidiasis of vulva and vagina: Secondary | ICD-10-CM

## 2013-03-07 DIAGNOSIS — R309 Painful micturition, unspecified: Secondary | ICD-10-CM

## 2013-03-07 DIAGNOSIS — N23 Unspecified renal colic: Secondary | ICD-10-CM

## 2013-03-07 DIAGNOSIS — B373 Candidiasis of vulva and vagina: Secondary | ICD-10-CM

## 2013-03-07 LAB — POCT URINALYSIS DIPSTICK
Leukocytes, UA: NEGATIVE
Nitrite, UA: NEGATIVE
Protein, UA: NEGATIVE
pH, UA: 5

## 2013-03-07 MED ORDER — TERCONAZOLE 80 MG VA SUPP: 80.0000 mg | Freq: Every day | VAGINAL | Status: DC

## 2013-03-07 MED ORDER — CIPROFLOXACIN HCL 500 MG PO TABS: 500.0000 mg | ORAL_TABLET | Freq: Two times a day (BID) | ORAL | Status: DC

## 2013-03-07 NOTE — Progress Notes (Signed)
Subjective:     [name] is a 47 y.o. female here for a routine exam.  Current complaints: Patient c/o irritation at urethra and vagina. Patient is having trouble with urination. Personal health questionnaire reviewed: no.   Gynecologic History No LMP recorded. Patient has had an ablation. Contraception: abstinence   Obstetric History OB History  No data available     The following portions of the patient's history were reviewed and updated as appropriate: allergies, current medications, past family history, past medical history, past social history, past surgical history and problem list.  Review of Systems Pertinent items are noted in HPI.    Objective:   SPEC: scant white discharge  Assessment:    Vaginitis/UTI  Plan:    Return prn

## 2013-03-08 ENCOUNTER — Other Ambulatory Visit: Payer: Self-pay | Admitting: Infectious Disease

## 2013-03-08 LAB — WET PREP BY MOLECULAR PROBE: Trichomonas vaginosis: NEGATIVE

## 2013-03-08 LAB — URINE CULTURE: Colony Count: NO GROWTH

## 2013-03-11 ENCOUNTER — Other Ambulatory Visit: Payer: Self-pay | Admitting: *Deleted

## 2013-03-11 DIAGNOSIS — F329 Major depressive disorder, single episode, unspecified: Secondary | ICD-10-CM

## 2013-03-11 DIAGNOSIS — B20 Human immunodeficiency virus [HIV] disease: Secondary | ICD-10-CM

## 2013-03-11 DIAGNOSIS — E785 Hyperlipidemia, unspecified: Secondary | ICD-10-CM

## 2013-03-11 DIAGNOSIS — E538 Deficiency of other specified B group vitamins: Secondary | ICD-10-CM

## 2013-03-11 DIAGNOSIS — G43909 Migraine, unspecified, not intractable, without status migrainosus: Secondary | ICD-10-CM

## 2013-03-11 DIAGNOSIS — R635 Abnormal weight gain: Secondary | ICD-10-CM

## 2013-03-11 MED ORDER — PROMETHAZINE HCL 50 MG PO TABS: 25.0000 mg | ORAL_TABLET | Freq: Four times a day (QID) | ORAL | Status: DC | PRN

## 2013-03-18 ENCOUNTER — Encounter: Payer: Self-pay | Admitting: Obstetrics & Gynecology

## 2013-03-19 ENCOUNTER — Other Ambulatory Visit: Payer: Medicaid Other

## 2013-03-19 DIAGNOSIS — B20 Human immunodeficiency virus [HIV] disease: Secondary | ICD-10-CM

## 2013-03-19 LAB — COMPLETE METABOLIC PANEL WITH GFR
BUN: 11 mg/dL (ref 6–23)
CO2: 34 mEq/L — ABNORMAL HIGH (ref 19–32)
Creat: 0.9 mg/dL (ref 0.50–1.10)
GFR, Est African American: 89 mL/min
GFR, Est Non African American: 77 mL/min
Glucose, Bld: 121 mg/dL — ABNORMAL HIGH (ref 70–99)
Total Bilirubin: 0.3 mg/dL (ref 0.3–1.2)

## 2013-03-19 LAB — CBC WITH DIFFERENTIAL/PLATELET
Basophils Relative: 0 % (ref 0–1)
Hemoglobin: 13 g/dL (ref 12.0–15.0)
Lymphs Abs: 1.6 10*3/uL (ref 0.7–4.0)
MCHC: 33.7 g/dL (ref 30.0–36.0)
Monocytes Relative: 4 % (ref 3–12)
Neutro Abs: 2.3 10*3/uL (ref 1.7–7.7)
Neutrophils Relative %: 56 % (ref 43–77)
RBC: 4.69 MIL/uL (ref 3.87–5.11)

## 2013-03-20 LAB — T-HELPER CELL (CD4) - (RCID CLINIC ONLY)
CD4 % Helper T Cell: 35 % (ref 33–55)
CD4 T Cell Abs: 550 /uL (ref 400–2700)

## 2013-03-21 ENCOUNTER — Ambulatory Visit: Payer: Medicaid Other | Admitting: Obstetrics & Gynecology

## 2013-03-25 ENCOUNTER — Ambulatory Visit: Payer: Medicaid Other | Admitting: Infectious Disease

## 2013-04-02 ENCOUNTER — Ambulatory Visit (INDEPENDENT_AMBULATORY_CARE_PROVIDER_SITE_OTHER): Payer: Medicaid Other | Admitting: Infectious Disease

## 2013-04-02 ENCOUNTER — Encounter: Payer: Self-pay | Admitting: Infectious Disease

## 2013-04-02 VITALS — BP 133/89 | HR 111 | Temp 98.4°F | Ht 66.0 in | Wt 239.0 lb

## 2013-04-02 DIAGNOSIS — G43909 Migraine, unspecified, not intractable, without status migrainosus: Secondary | ICD-10-CM

## 2013-04-02 DIAGNOSIS — E538 Deficiency of other specified B group vitamins: Secondary | ICD-10-CM

## 2013-04-02 DIAGNOSIS — F329 Major depressive disorder, single episode, unspecified: Secondary | ICD-10-CM

## 2013-04-02 DIAGNOSIS — R635 Abnormal weight gain: Secondary | ICD-10-CM

## 2013-04-02 DIAGNOSIS — B20 Human immunodeficiency virus [HIV] disease: Secondary | ICD-10-CM

## 2013-04-02 DIAGNOSIS — Z23 Encounter for immunization: Secondary | ICD-10-CM

## 2013-04-02 DIAGNOSIS — E785 Hyperlipidemia, unspecified: Secondary | ICD-10-CM

## 2013-04-02 MED ORDER — RITONAVIR 100 MG PO TABS: 100.0000 mg | ORAL_TABLET | Freq: Every day | ORAL | Status: DC

## 2013-04-02 MED ORDER — FLUCONAZOLE 100 MG PO TABS: 100.0000 mg | ORAL_TABLET | Freq: Every day | ORAL | Status: DC

## 2013-04-02 MED ORDER — DARUNAVIR ETHANOLATE 800 MG PO TABS: 800.0000 mg | ORAL_TABLET | Freq: Every day | ORAL | Status: DC

## 2013-04-02 MED ORDER — PROMETHAZINE HCL 50 MG PO TABS: 25.0000 mg | ORAL_TABLET | Freq: Four times a day (QID) | ORAL | Status: DC | PRN

## 2013-04-02 MED ORDER — EMTRICITABINE-TENOFOVIR DF 200-300 MG PO TABS: 1.0000 | ORAL_TABLET | Freq: Every day | ORAL | Status: DC
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
## 2013-04-03 ENCOUNTER — Other Ambulatory Visit: Payer: Self-pay

## 2013-04-03 ENCOUNTER — Encounter: Payer: Self-pay | Admitting: Obstetrics & Gynecology

## 2013-04-03 ENCOUNTER — Ambulatory Visit (INDEPENDENT_AMBULATORY_CARE_PROVIDER_SITE_OTHER): Payer: Medicaid Other | Admitting: Obstetrics & Gynecology

## 2013-04-03 ENCOUNTER — Other Ambulatory Visit: Payer: Self-pay | Admitting: *Deleted

## 2013-04-03 VITALS — BP 131/81 | HR 93 | Temp 97.7°F | Ht 66.0 in | Wt 239.8 lb

## 2013-04-03 DIAGNOSIS — G894 Chronic pain syndrome: Secondary | ICD-10-CM

## 2013-04-03 DIAGNOSIS — N760 Acute vaginitis: Secondary | ICD-10-CM

## 2013-04-03 NOTE — Telephone Encounter (Signed)
Call pt when ready, she states she is out of pain med. Pt # [phone]

## 2013-04-03 NOTE — Progress Notes (Deleted)
.   Subjective:     [name] is a 47 y.o. female here for a routine exam.  Current complaints: patient states that she is here for a biopsy.  Personal health questionnaire reviewed: {yes/no:9010}.   Gynecologic History No LMP recorded. Patient has had an ablation. Contraception: none Last Pap: 2013. Results were: normal Last mammogram: 2013. Results were: normal  Obstetric History OB History  No data available     {Common ambulatory SmartLinks:19316}  Review of Systems {ros; complete:30496}    Objective:    {exam; complete:18323}    Assessment:    Healthy female exam.    Plan:    {plan:19193}

## 2013-04-03 NOTE — Patient Instructions (Signed)
Vulva Biopsy Care After These instructions give you information on caring for yourself after your procedure. Your doctor may also give you more specific instructions. Call your doctor if you have any problems or questions after your procedure. HOME CARE   Only take medicine as told by your doctor.  Do not rub the biopsy area after you pee (urinate). Gently pat the area dry. You can use a bottle filled with warm water (peri-bottle) to clean the area. Gently wipe from front to back.  Clean the area using water and mild soap. Gently pat the area dry.  Check the biopsy area every day using a mirror. Make sure it is healing.  Do not have sex (intercourse) for 3 4 days or as told by your doctor.  Avoid exercise (running, biking) for 2 3 days or as told by your doctor.  You may shower after the procedure. Avoid bathing and swimming until the stitches (sutures) dissolve and healing is complete.  Wear loose cotton underwear. Do not wear tight pants.  Keep all follow-up visits with your doctor.  Call your doctor to get your test results. GET HELP RIGHT AWAY IF:   You have pain that gets worse and is not helped by pain medicine.  Your vaginal area becomes puffy (swollen) or red.  You have fluid or an abnormally bad smell coming from your vagina.  You have a fever. MAKE SURE YOU:  Understand these instructions.  Will watch your condition.  Will get help right away if you are not doing well or get worse. Document Released: 09/16/2008 Document Revised: 06/06/2012 Document Reviewed: 04/16/2012 [employer] Patient Information 2014 [employer].  

## 2013-04-03 NOTE — Progress Notes (Signed)
Patient ID: [name], female   DOB: [birth year], 47 y.o.   MRN: [id number]  No chief complaint on file.   HPI [name] is a 47 y.o. female.  She presents for biopsy of the vulva.  Indication: erythema of the vulva Symptoms:   pruritic and not painful  Location: vestibule  Past Medical History  Diagnosis Date  . Anemia 10/2009    macrocytic anemia with baseline MCV 104-106  . PUD (peptic ulcer disease) 07/1997    per EGD report 07/1997 with history of esophagitis  . Migraines     on topamax and triptans, frequent (almost daily) attacks   . Hyperlipidemia   . HIV infection     CD4 = 570 (05/2010), VL undetectation  . Anxiety   . Spondylolisthesis of lumbar region     L5-S1  . Gastroparesis   . Shingles     in lumbar dermatome  . Hypertension     no meds  . Non Hodgkin's lymphoma     stage II, s/p resection, chemotherapy, radiotherapy, Dr. Cyndie Chime is her oncologist.   . Asthma   . DVT (deep venous thrombosis)     history clot lt groin when she had lymphoma-in remision now.  . Traumatic tear of lateral meniscus of right knee 04/20/2012  . 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 Hiatal hernia 01/01/2002  . Acute gastritis without mention of hemorrhage 01/01/2002  . Duodenitis without mention of hemorrhage 01/01/2002  . GERD (gastroesophageal reflux disease)   . IBS (irritable bowel syndrome)     Past Surgical History  Procedure Laterality Date  . Upper gastrointestinal endoscopy  2003, 2007    done by Dr. Corinda Gubler  . Tonsillectomy    . Endometrial ablation    . Tympanostomy tube placement      as child-both  . Tympanoplasty      left  . Cholecystectomy    . Knee arthroscopy  04/20/12    right    Family History  Problem Relation Age of Onset  . Hypertension Mother   . Stroke Mother   . Breast cancer Paternal Grandmother   . Colon cancer Neg Hx   . Kidney cancer Maternal Uncle   . Diabetes Maternal Uncle   . Diabetes Maternal Grandmother     Social  History History  Substance Use Topics  . Smoking status: Never Smoker   . Smokeless tobacco: Never Used  . Alcohol Use: 0.5 oz/week    1 drink(s) per week     Comment: on her birthday     Allergies  Allergen Reactions  . Diazepam     Triggers migraines  . Divalproex Sodium   . Gabapentin Nausea And Vomiting  . Ondansetron Hcl     Triggers migraines  . Penicillins     REACTION: lips swollen to point of bleeding, sever vaginal irritation  . Propoxyphene-Acetaminophen Nausea And Vomiting  . Simvastatin     PATIENT CANNOT BE PRESCRIBED THIS STATIN WHILE RECEIVING NORVIR  . Tramadol Hcl     REACTION: migraines and itching--hx of migraines even without medication   . Zocor [Simvastatin - High Dose]   . Zofran [Ondansetron Hcl]     migraines    Current Outpatient Prescriptions  Medication Sig Dispense Refill  . albuterol-ipratropium (COMBIVENT) 18-103 MCG/ACT inhaler Inhale 2 puffs into the lungs every 6 (six) hours as needed for wheezing.  1 Inhaler  1  . Calcium Carbonate-Vitamin D (CALCIUM-VITAMIN D) 500-200 MG-UNIT per tablet Take 1  tablet by mouth daily.      . clonazePAM (KLONOPIN) 0.5 MG tablet Take 1 tablet 3 times per day for muscle spasm and anxiety.  90 tablet  0  . Darunavir Ethanolate (PREZISTA) 800 MG tablet Take 1 tablet (800 mg total) by mouth daily.  30 tablet  11  . diclofenac sodium (VOLTAREN) 1 % GEL APPLY TOPICALLY TWICE A DAY AS NEEDED FOR PAIN  100 g  3  . DULoxetine (CYMBALTA) 30 MG capsule Take 3 capsules (90 mg total) by mouth daily.  270 capsule  1  . DULoxetine (CYMBALTA) 30 MG capsule Take 90 mg by mouth daily.      Marland Kitchen emtricitabine-tenofovir (TRUVADA) 200-300 MG per tablet Take 1 tablet by mouth daily.  30 tablet  11  . fluconazole (DIFLUCAN) 100 MG tablet Take 1 tablet (100 mg total) by mouth daily.  10 tablet  4  . montelukast (SINGULAIR) 10 MG tablet Take 10 mg by mouth at bedtime as needed.       Marland Kitchen omeprazole (PRILOSEC) 40 MG capsule Take 40 mg by  mouth 2 (two) times daily.      Marland Kitchen oxyCODONE-acetaminophen (PERCOCET/ROXICET) 5-325 MG per tablet Take 1 tablet by mouth every 6 (six) hours as needed. Please do not fill before October 18, 2012  120 tablet  0  . oxyCODONE-acetaminophen (PERCOCET/ROXICET) 5-325 MG per tablet Take 1 tablet by mouth every 6 (six) hours as needed for pain.  120 tablet  0  . oxyCODONE-acetaminophen (PERCOCET/ROXICET) 5-325 MG per tablet Take 1 tablet by mouth every 6 (six) hours as needed for pain.  120 tablet  0  . oxyCODONE-acetaminophen (PERCOCET/ROXICET) 5-325 MG per tablet Take 1 tablet by mouth every 6 (six) hours as needed for pain.  120 tablet  0  . PRESCRIPTION MEDICATION Ear drops- rx name unknown to pt- one drop up to four times a day right ear      . PROAIR HFA 108 (90 BASE) MCG/ACT inhaler TWO PUFFS EVERY SIX HOURS AS NEEDED FOR WHEEZING  8.5 g  2  . promethazine (PHENERGAN) 50 MG tablet Take 0.5-1 tablets (25-50 mg total) by mouth every 6 (six) hours as needed for nausea. Up to 4 times a day as needed for nausea  90 tablet  11  . ritonavir (NORVIR) 100 MG TABS tablet Take 1 tablet (100 mg total) by mouth daily.  30 tablet  11  . rosuvastatin (CRESTOR) 20 MG tablet Take 1 tablet (20 mg total) by mouth daily.  30 tablet  11  . scopolamine (TRANSDERM-SCOP) 1.5 MG Place 1 patch (1.5 mg total) onto the skin every other day as needed.  10 patch  3  . Tamsulosin HCl (FLOMAX) 0.4 MG CAPS Take 0.4 mg by mouth daily.        Marland Kitchen terconazole (TERAZOL 3) 0.8 % vaginal cream Place 1 applicator vaginally at bedtime.  20 g  5  . terconazole (TERAZOL 3) 80 MG vaginal suppository Place 1 suppository (80 mg total) vaginally at bedtime.  3 suppository  0  . zolmitriptan (ZOMIG) 5 MG tablet Take 1 tablet (5 mg total) by mouth as needed for migraine.  15 tablet  6   No current facility-administered medications for this visit.     Blood pressure 131/81, pulse 93, temperature 97.7 F (36.5 C), temperature source Oral, height 5\' 6"   (1.676 m), weight 108.773 kg (239 lb 12.8 oz).  Physical Exam Pelvic: Vestibule--moderate erythema    Assessment   ?Vestibulitis Prepping  with Betadine Local anesthesia with 1% Buffered Lidocaine 4 mm punch biopsy performed per protocol Silver Nitrate applied:  Yes Well tolerated  Specimen appropriately identified and sent to pathology    Plan    Follow-up:  4 weeks      JACKSON-MOORE,Kemond Amorin A 04/03/2013, 4:02 PM

## 2013-04-04 LAB — HSV 2 ANTIBODY, IGG: HSV 2 Glycoprotein G Ab, IgG: 9.3 IV — ABNORMAL HIGH

## 2013-04-05 ENCOUNTER — Encounter: Payer: Self-pay | Admitting: Obstetrics & Gynecology

## 2013-04-05 ENCOUNTER — Telehealth: Payer: Self-pay

## 2013-04-05 DIAGNOSIS — A6000 Herpesviral infection of urogenital system, unspecified: Secondary | ICD-10-CM | POA: Insufficient documentation

## 2013-04-05 NOTE — Telephone Encounter (Signed)
A doctor from [facility] called regarding patient.  Unable to understand name.  She has questions about pain medication.  She can be paged at [phone].

## 2013-04-05 NOTE — Telephone Encounter (Signed)
See previous message. I believe this phone call was probably from the PCP Darden Palmer.  I am uncertain of your prescribing plan for this patient.

## 2013-04-08 NOTE — Telephone Encounter (Signed)
Paged provider regarding patient.

## 2013-04-08 NOTE — Telephone Encounter (Signed)
Spoke with provider regarding patient.  She discussed further questions with Dr Wynn Banker.

## 2013-04-08 NOTE — Telephone Encounter (Signed)
I recommended MRI and spine injections which pt refused Will not be prscribing pain meds

## 2013-04-09 ENCOUNTER — Other Ambulatory Visit: Payer: Self-pay | Admitting: Internal Medicine

## 2013-04-09 ENCOUNTER — Other Ambulatory Visit: Payer: Self-pay

## 2013-04-09 DIAGNOSIS — G894 Chronic pain syndrome: Secondary | ICD-10-CM

## 2013-04-09 DIAGNOSIS — Z1231 Encounter for screening mammogram for malignant neoplasm of breast: Secondary | ICD-10-CM

## 2013-04-09 MED ORDER — OXYCODONE-ACETAMINOPHEN 5-325 MG PO TABS: 1.0000 | ORAL_TABLET | Freq: Four times a day (QID) | ORAL | Status: DC | PRN

## 2013-04-10 ENCOUNTER — Ambulatory Visit: Payer: Medicaid Other | Admitting: Neurology

## 2013-04-10 ENCOUNTER — Other Ambulatory Visit: Payer: Self-pay | Admitting: Internal Medicine

## 2013-04-10 DIAGNOSIS — G894 Chronic pain syndrome: Secondary | ICD-10-CM

## 2013-04-10 MED ORDER — OXYCODONE-ACETAMINOPHEN 5-325 MG PO TABS: 1.0000 | ORAL_TABLET | Freq: Four times a day (QID) | ORAL | Status: DC | PRN

## 2013-04-10 NOTE — Progress Notes (Signed)
Patient never received a prescription for her September percocet.  Prescriptions have been written for the next 3 months starting on October 17th.  I filled an additional #40 to get her through to October 17th.

## 2013-04-11 ENCOUNTER — Other Ambulatory Visit: Payer: Self-pay | Admitting: *Deleted

## 2013-04-11 DIAGNOSIS — R05 Cough: Secondary | ICD-10-CM

## 2013-04-11 MED ORDER — IPRATROPIUM-ALBUTEROL 18-103 MCG/ACT IN AERO: 2.0000 | INHALATION_SPRAY | Freq: Four times a day (QID) | RESPIRATORY_TRACT | Status: DC | PRN

## 2013-04-15 ENCOUNTER — Encounter: Payer: Self-pay | Admitting: Obstetrics & Gynecology

## 2013-04-15 ENCOUNTER — Ambulatory Visit (INDEPENDENT_AMBULATORY_CARE_PROVIDER_SITE_OTHER): Payer: Medicaid Other | Admitting: Obstetrics & Gynecology

## 2013-04-15 VITALS — BP 114/81 | HR 100 | Temp 97.8°F | Ht 66.0 in | Wt 243.0 lb

## 2013-04-15 DIAGNOSIS — L280 Lichen simplex chronicus: Secondary | ICD-10-CM

## 2013-04-15 MED ORDER — CLINDAMYCIN HCL 300 MG PO CAPS: 300.0000 mg | ORAL_CAPSULE | Freq: Three times a day (TID) | ORAL | Status: DC

## 2013-04-15 NOTE — Progress Notes (Signed)
Subjective:     [name] is a 47 y.o. female here for a routine exam.  Current complaints: patient in office today for her vulvar biopsy results. Pt states she is also still having pain in her vaginal area. Pt states the pain has eased up but not went away. Pt states she has been using tylenol and an ice pack. Pt states she has sen a pus like discharge. Pt states that is going away.  Personal health questionnaire reviewed: yes.   Gynecologic History No LMP recorded. Patient has had an ablation. Contraception: abstinence   Obstetric History OB History  No data available     The following portions of the patient's history were reviewed and updated as appropriate: allergies, current medications, past family history, past medical history, past social history, past surgical history and problem list.  Review of Systems Pertinent items are noted in HPI.    Objective:    Pelvic: biopsy site erythematous, minimal tenderness, fibrinous exudate present    Assessment:    ?Cellulitis at biopsy site--vulva  Plan:   Antibiotics/sitz baths Keep previous appointment

## 2013-04-16 ENCOUNTER — Ambulatory Visit: Payer: Medicaid Other

## 2013-04-17 ENCOUNTER — Ambulatory Visit (INDEPENDENT_AMBULATORY_CARE_PROVIDER_SITE_OTHER): Payer: Medicaid Other | Admitting: Sports Medicine

## 2013-04-17 ENCOUNTER — Encounter: Payer: Self-pay | Admitting: Sports Medicine

## 2013-04-17 VITALS — BP 124/79 | Ht 66.0 in | Wt 239.0 lb

## 2013-04-17 DIAGNOSIS — M542 Cervicalgia: Secondary | ICD-10-CM

## 2013-04-17 MED ORDER — LIDOCAINE 5 % EX PTCH: 1.0000 | MEDICATED_PATCH | CUTANEOUS | Status: DC

## 2013-04-17 NOTE — Patient Instructions (Signed)
Thank you for coming in today We will refer you to PM&R We will try lidoderm patch Followup as needed
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
## 2013-04-18 NOTE — Addendum Note (Signed)
Addended by: Jacki Cones C on: 04/18/2013 01:24 PM   Modules accepted: Orders

## 2013-04-19 ENCOUNTER — Other Ambulatory Visit: Payer: Self-pay | Admitting: *Deleted

## 2013-04-19 DIAGNOSIS — M542 Cervicalgia: Secondary | ICD-10-CM

## 2013-04-19 DIAGNOSIS — M62838 Other muscle spasm: Secondary | ICD-10-CM

## 2013-04-22 NOTE — Telephone Encounter (Signed)
i am off today, will refill this in clinic tomorrow.

## 2013-04-22 NOTE — Telephone Encounter (Signed)
i am off today, will fill this tomorrow in clinic. sorry

## 2013-04-22 NOTE — Addendum Note (Signed)
Addended by: Jacki Cones C on: 04/22/2013 12:19 PM   Modules accepted: Orders

## 2013-04-23 ENCOUNTER — Other Ambulatory Visit: Payer: Self-pay | Admitting: Internal Medicine

## 2013-04-23 ENCOUNTER — Other Ambulatory Visit: Payer: Self-pay | Admitting: Family Medicine

## 2013-04-23 DIAGNOSIS — F419 Anxiety disorder, unspecified: Secondary | ICD-10-CM

## 2013-04-23 DIAGNOSIS — M62838 Other muscle spasm: Secondary | ICD-10-CM

## 2013-04-23 DIAGNOSIS — M542 Cervicalgia: Secondary | ICD-10-CM

## 2013-04-23 MED ORDER — CLONAZEPAM 0.5 MG PO TABS: ORAL_TABLET | ORAL | Status: DC

## 2013-04-23 NOTE — Assessment & Plan Note (Signed)
Decreased klonopin down to bid since increase to tid has not had much improvement in neck pain with Dr. Darrick Penna. Discussed with Ms. Latona on the phone on 04/23/13. We will work to titrate back down the dose slowly.

## 2013-04-24 ENCOUNTER — Ambulatory Visit (INDEPENDENT_AMBULATORY_CARE_PROVIDER_SITE_OTHER): Payer: Medicaid Other | Admitting: Neurology

## 2013-04-24 ENCOUNTER — Encounter: Payer: Self-pay | Admitting: Neurology

## 2013-04-24 VITALS — Ht 65.0 in | Wt 244.0 lb

## 2013-04-24 DIAGNOSIS — G43909 Migraine, unspecified, not intractable, without status migrainosus: Secondary | ICD-10-CM

## 2013-04-24 DIAGNOSIS — G43719 Chronic migraine without aura, intractable, without status migrainosus: Secondary | ICD-10-CM

## 2013-04-24 MED ORDER — ONABOTULINUMTOXINA 100 UNITS IJ SOLR
150.0000 [IU] | Freq: Once | INTRAMUSCULAR | Status: AC
  Administered 2013-04-24: 150 [IU] via INTRAMUSCULAR

## 2013-04-24 NOTE — Telephone Encounter (Signed)
Klonopin rx called to [facility].

## 2013-04-24 NOTE — Telephone Encounter (Signed)
New Rx faxed in pharmacy notified of decrease in # of clonazepam.

## 2013-04-24 NOTE — Progress Notes (Signed)
HPI:   [name] is a 47 year old Caucasian female with past medical history of HIV, high cholesterol, migraines, depression, and non-Hodgkin lymphoma in 2005 with chemotherapy and radiation.   She has history of migraines since 19. In the last 10 years, she reports having a migraines daily. She is currently taking Topamax and Zomig with relief. She takes approximately 20-25 tablets of her Zomig per month.    She has aura's that look like floating dirt particles, flashing lights, and she sees Christmas tree lights when they are severe, she has nausea, vomiting and sometimes diarrhea when they're severe. She also complains of diplopia, blurry vision, and  photophobia. She also has had a lightning bolt sensation throughout her body.  She describes them as pulsating and throbbing  on a scale of 5-6/10 which she considers mild, when they are severe they can go as high as 10/10. They usually start in the right temporal area, but will also start on the left temporal and are worse.   Any type of smoke or MSG can precipitate a migraine. They generally last 2-9 days. She has been to the emergency department twice in the last year for migraines. She states "I just can't do this anymore I need to do something about her migraines"  She has tried in the past Maxalt, Imitrex, atenolol, verapamil, nortriptyline, Inderal, Depakote without benefit. Atenolol and verapamil caused hypotension. Depakote caused severe vomiting. Topamax causing her worsening headaches, I also put her on Zonegran, she denies significant improvement.  UPDATE Apr 24, 2013:  Last injection was in July 2014, she responded very well, there was no significant side effect noticed  Physical Exam  General: Well groomed and obese female Neck: supple no carotid bruits Respiratory: clear to auscultation bilaterally Cardiovascular: regular rate rhythm She wear right knee brace  Neurologic Exam  Mental Status: obese, pleasant, awake, alert,  cooperative to history, talking, and casual conversation. Cranial Nerves: CN II-XII pupils were equal round reactive to light. Extraocular movements were full.  Visual fields were full on confrontational test.  Facial sensation and strength were normal.   Uvula tongue were midline.  Head turning and shoulder shrugging were normal and symmetric.  Tongue protrusion into the cheeks strength were normal.  Motor: Normal tone, bulk, and strength. Sensory: Normal to light touch, pinprick, proprioception except decreased vibratory sensation to her right lower extremity she relates to peripheral neuropathy.  Decreased vibratory sensation to left upper extremity.   Coordination: There was no dysmetria noticed. Gait and Station: Narrow based and steady,  Reflexes: Deep tendon reflexes: present and symmetric.  Plantar responses are flexor.   Assessment and plan  47 year-old Caucasian female with a history of HIV, hypercholesterolemia, Non hodgkin' lymphoma with chemotherapy and radiation and migraines since age 65.   She has tried and failed multiple preventive medications including atenolol, verapamil Maxalt, Imitrex, nortriptyline, and Depakote,Topamax,   She has migraines daily for the last 10 years. Currently taking  zonogram  and Zomig with some relief.   Normal MRI brain, and neurological exam.   BOTOX injection was performed according to protocol by [employer].  was exception of skipping injection to frontalis this time, because of decreased wrinkle frontalis muscle movement  100 units of BOTOX was dissolved into 2 cc NS. (Lot [id number], exp May 2017)  Total of 150 units,    Frontalis 4 site 20 units Temporalis 8 sites,  40 units  Occipitalis 6 sites, 30 units Cervical Paraspinal, 6 sites, 30 units Trapezius, 6 sites, 30  units  Patient tolerate the injection well. Will return for repeat injection in 3 months.

## 2013-04-30 ENCOUNTER — Encounter: Payer: Medicaid Other | Admitting: Internal Medicine

## 2013-05-01 ENCOUNTER — Ambulatory Visit: Payer: Medicaid Other

## 2013-05-02 ENCOUNTER — Ambulatory Visit (INDEPENDENT_AMBULATORY_CARE_PROVIDER_SITE_OTHER): Payer: Medicaid Other | Admitting: Obstetrics & Gynecology

## 2013-05-02 ENCOUNTER — Encounter: Payer: Self-pay | Admitting: Obstetrics & Gynecology

## 2013-05-02 VITALS — BP 119/80 | HR 81 | Temp 97.9°F | Ht 66.0 in | Wt 247.0 lb

## 2013-05-02 DIAGNOSIS — Z0000 Encounter for general adult medical examination without abnormal findings: Secondary | ICD-10-CM

## 2013-05-02 NOTE — Progress Notes (Signed)
Subjective:     [name] is a 47 y.o. female here for a routine exam.  Current complaints: Patient is in the office for her annual exam. Patient reports she is doing better today. Personal health questionnaire reviewed: no.   Gynecologic History No LMP recorded. Patient has had an ablation. Contraception: condoms Last Pap: 1 year. Results were: normal Last mammogram: appointment tomorrow. Results were: normal  Obstetric History OB History  No data available     The following portions of the patient's history were reviewed and updated as appropriate: allergies, current medications, past family history, past medical history, past social history, past surgical history and problem list.  Review of Systems Pertinent items are noted in HPI.    Objective:    General appearance: alert Breasts: normal appearance, no masses or tenderness Abdomen: soft, non-tender; bowel sounds normal; no masses,  no organomegaly Pelvic: cervix normal in appearance, external genitalia normal, no adnexal masses or tenderness, uterus normal size, shape, and consistency and vagina normal without discharge    Assessment:    Healthy female exam.    Plan:   Return prn

## 2013-05-02 NOTE — Patient Instructions (Signed)

## 2013-05-03 ENCOUNTER — Ambulatory Visit
Admission: RE | Admit: 2013-05-03 | Discharge: 2013-05-03 | Disposition: A | Discharge status: Home / Self Care | Payer: Medicaid Other | Source: Ambulatory Visit

## 2013-05-03 DIAGNOSIS — Z1231 Encounter for screening mammogram for malignant neoplasm of breast: Secondary | ICD-10-CM

## 2013-05-08 ENCOUNTER — Other Ambulatory Visit: Payer: Self-pay | Admitting: Infectious Disease

## 2013-05-09 ENCOUNTER — Encounter: Payer: Self-pay | Admitting: Obstetrics & Gynecology

## 2013-05-14 ENCOUNTER — Ambulatory Visit (INDEPENDENT_AMBULATORY_CARE_PROVIDER_SITE_OTHER): Payer: Medicaid Other | Admitting: Internal Medicine

## 2013-05-14 ENCOUNTER — Encounter: Payer: Self-pay | Admitting: Internal Medicine

## 2013-05-14 VITALS — BP 119/84 | HR 90 | Temp 97.1°F | Ht 65.0 in | Wt 250.1 lb

## 2013-05-14 DIAGNOSIS — F411 Generalized anxiety disorder: Secondary | ICD-10-CM

## 2013-05-14 DIAGNOSIS — F329 Major depressive disorder, single episode, unspecified: Secondary | ICD-10-CM

## 2013-05-14 DIAGNOSIS — F419 Anxiety disorder, unspecified: Secondary | ICD-10-CM

## 2013-05-14 MED ORDER — SERTRALINE HCL 25 MG PO TABS: 25.0000 mg | ORAL_TABLET | Freq: Every day | ORAL | Status: DC

## 2013-05-14 NOTE — Assessment & Plan Note (Signed)
She "titrated" herself off cymbalta, has not had any for 2 weeks. Would like to try something new, claiming cymbalta does not help. Denies suicidal or homicidal ideation but says anxiety is high and she feels down more than usual even when on cymbalta.   -zoloft, with caution of side effects and possible serotonin syndrome

## 2013-05-14 NOTE — Progress Notes (Signed)
Subjective:   Patient ID: [name] female   DOB: 1966-06-28 47 y.o.   MRN: [id number]  HPI: [name] is a 47 y.o. white female with PMH of HIV well controlled, chronic back pain, s/p right knee surgery 04/2012, and anxiety presenting to clinic today for routine follow up visit. She reports continued anxiety and says the klonopin is not working and that she notices facial and neck swelling each time she takes the klonopin which resolves after a few hours. Therefore she has stopped taking it. She has also stopped taking cymbalta as she says it has not helped her depression anymore. The cymbalta she says she "titrated" herself down and has not had any for the past 2 weeks. Today, she is requesting something new for her anxiety and depression and reports that she has taken zoloft in the past and would be willing to try that. We discussed side effect profile of zoloft and especially possible serotonin syndrome with these medications in addition to the addition of triptans to her medication list by another provider. She says she wishes to keep both medications on board, understands the risk, and will notify us if any concerning symptoms appear. She is instructed to stop medications right away and contact us or go to ED. She denies any suicidal or homicidal ideation and says she just feels more "down" lately with increased anxiety.   Otherwise, she reports improvement in her right knee from before but still has neck pain from whiplash. She follows up with ID and her other specialists for pain as scheduled.   Past Medical History  Diagnosis Date  . Anemia 10/2009    macrocytic anemia with baseline MCV 104-106  . PUD (peptic ulcer disease) 07/1997    per EGD report 07/1997 with history of esophagitis  . Migraines     on topamax and triptans, frequent (almost daily) attacks   . Hyperlipidemia   . HIV infection     CD4 = 570 (05/2010), VL undetectation  . Anxiety   . Spondylolisthesis of lumbar  region     L5-S1  . Gastroparesis   . Shingles     in lumbar dermatome  . Hypertension     no meds  . Non Hodgkin's lymphoma     stage II, s/p resection, chemotherapy, radiotherapy, Dr. Cyndie Chime is her oncologist.   . Asthma   . DVT (deep venous thrombosis)     history clot lt groin when she had lymphoma-in remision now.  . Traumatic tear of lateral meniscus of right knee 04/20/2012  . 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 Hiatal hernia 01/01/2002  . Acute gastritis without mention of hemorrhage 01/01/2002  . Duodenitis without mention of hemorrhage 01/01/2002  . GERD (gastroesophageal reflux disease)   . IBS (irritable bowel syndrome)    Current Outpatient Prescriptions  Medication Sig Dispense Refill  . albuterol-ipratropium (COMBIVENT) 18-103 MCG/ACT inhaler Inhale 2 puffs into the lungs every 6 (six) hours as needed for wheezing.  1 Inhaler  3  . Calcium Carbonate-Vitamin D (CALCIUM-VITAMIN D) 500-200 MG-UNIT per tablet Take 1 tablet by mouth daily.      . CRESTOR 20 MG tablet TAKE 1 TABLET BY MOUTH EVERY DAY  30 tablet  2  . Darunavir Ethanolate (PREZISTA) 800 MG tablet Take 1 tablet (800 mg total) by mouth daily.  30 tablet  11  . diclofenac sodium (VOLTAREN) 1 % GEL APPLY TOPICALLY TWICE A DAY AS NEEDED FOR PAIN  100 g  3  . emtricitabine-tenofovir (TRUVADA) 200-300 MG per tablet Take 1 tablet by mouth daily.  30 tablet  11  . fluconazole (DIFLUCAN) 100 MG tablet Take 1 tablet (100 mg total) by mouth daily.  10 tablet  4  . lidocaine (LIDODERM) 5 % Place 1 patch onto the skin daily. Remove & Discard patch within 12 hours or as directed by MD  30 patch  0  . montelukast (SINGULAIR) 10 MG tablet Take 10 mg by mouth at bedtime as needed.       Marland Kitchen omeprazole (PRILOSEC) 40 MG capsule Take 40 mg by mouth 2 (two) times daily.      Marland Kitchen oxyCODONE-acetaminophen (PERCOCET/ROXICET) 5-325 MG per tablet Take 1 tablet by mouth every 6 (six) hours as needed for pain.  40 tablet  0  . PROAIR  HFA 108 (90 BASE) MCG/ACT inhaler TWO PUFFS EVERY SIX HOURS AS NEEDED FOR WHEEZING  8.5 g  2  . promethazine (PHENERGAN) 50 MG tablet Take 0.5-1 tablets (25-50 mg total) by mouth every 6 (six) hours as needed for nausea. Up to 4 times a day as needed for nausea  90 tablet  11  . ritonavir (NORVIR) 100 MG TABS tablet Take 1 tablet (100 mg total) by mouth daily.  30 tablet  11  . scopolamine (TRANSDERM-SCOP) 1.5 MG Place 1 patch (1.5 mg total) onto the skin every other day as needed.  10 patch  3  . Tamsulosin HCl (FLOMAX) 0.4 MG CAPS Take 0.4 mg by mouth daily.        Marland Kitchen terconazole (TERAZOL 3) 0.8 % vaginal cream Place 1 applicator vaginally at bedtime.  20 g  5  . zolmitriptan (ZOMIG) 5 MG tablet Take 1 tablet (5 mg total) by mouth as needed for migraine.  15 tablet  6  . PRESCRIPTION MEDICATION Ear drops- rx name unknown to pt- one drop up to four times a day right ear      . sertraline (ZOLOFT) 25 MG tablet Take 1 tablet (25 mg total) by mouth daily. Titrate up to 50mg  daily after one week if well tolerated  30 tablet  2   No current facility-administered medications for this visit.   Family History  Problem Relation Age of Onset  . Hypertension Mother   . Stroke Mother   . Breast cancer Paternal Grandmother   . Colon cancer Neg Hx   . Kidney cancer Maternal Uncle   . Diabetes Maternal Uncle   . Diabetes Maternal Grandmother    History   Social History  . Marital Status: Divorced    Spouse Name: N/A    Number of Children: 0  . Years of Education: 12   Occupational History  . disabled    Social History Main Topics  . Smoking status: Never Smoker   . Smokeless tobacco: Never Used  . Alcohol Use: 0.5 oz/week    1 drink(s) per week     Comment: on her birthday   . Drug Use: No  . Sexual Activity: None   Other Topics Concern  . None   Social History Narrative      Patient is disabled.   Right handed.   Caffeine - Diet soda., tea   Education - high school   Review of  Systems:  Constitutional:  Weight gain. Denies fever, chills, diaphoresis, appetite change and fatigue.   HEENT:  Denies congestion, sore throat, rhinorrhea, sneezing, mouth sores, trouble swallowing, neck pain   Respiratory:  Denies SOB, DOE,  cough, and wheezing.   Cardiovascular:  Denies chest pain, palpitations, and leg swelling.   Gastrointestinal:  Obese. Denies nausea, vomiting, abdominal pain, diarrhea, constipation, blood in stool and abdominal distention.   Genitourinary:  Denies dysuria, urgency, frequency, hematuria, flank pain and difficulty urinating.   Musculoskeletal:  Chronic neck and back pain.   Skin:  Denies pallor, rash and wound.   Neurological/Psych:  Depression and anxiety. Denies dizziness, syncope   Objective:  Physical Exam: Filed Vitals:   05/14/13 0859  BP: 119/84  Pulse: 90  Temp: 97.1 F (36.2 C)  TempSrc: Oral  Height: 5\' 5"  (1.651 m)  Weight: 250 lb 1.6 oz (113.445 kg)  SpO2: 98%   Vitals reviewed. General: sitting in chair, NAD HEENT: PERRL, EOMI, no scleral icterus Cardiac: RRR Pulm: clear to auscultation bilaterally, no wheezes, rales, or rhonchi Abd: soft, nontender, nondistended, BS present Ext: warm and well perfused, no pedal edema, +2DP B/L, blue nail polish on nails, mild tenderness to palpation on anterior surface of right knee, well healed scar, reports neck pain on lateral rotation towards the right Neuro: alert and oriented X3, cranial nerves II-XII grossly intact, strength equal in bilateral upper and lower extremities, decreased sensation on right lower and upper extremity, equal on face  Assessment & Plan:  Discussed with Dr. Dalphine Handing -changed from cymbalta to zoloft F/u 1 week

## 2013-05-14 NOTE — Assessment & Plan Note (Addendum)
Refuses to take klonopin anymore, claiming it causes facial and neck swelling and does not work.   -will try zoloft, slowly titrate up from 25mg  and increase to 50mg  in one week -follow up 1 month -advised on side effect profile of zoloft, especially in regards to possible serotonin syndrome with triptan's. She was previously on cymbalta. She understands the risks and wishes to proceed. We discussed possible other medications including wellbutrin but she refused and says she has tried zoloft in the past and would like to try again.  -advised to go to ED and to call or come to clinic if any symptoms noticed including but not limited to fever, rigors, chills stiffness, worsening depression, diaphoresis, chest pain

## 2013-05-14 NOTE — Patient Instructions (Addendum)
Please start zoloft slowly, we will titrate this medication up with time. Increase to 50mg  after one week if 25mg  well tolerated. Please read below of medication side effects as we discussed to monitor for.  Follow up in 1 month  Sertraline tablets What is this medicine? SERTRALINE (SER tra leen) is used to treat depression. It may also be used to treat obsessive compulsive disorder, panic disorder, post-trauma stress, premenstrual dysphoric disorder (PMDD) or social anxiety. This medicine may be used for other purposes; ask your health care provider or pharmacist if you have questions. COMMON BRAND NAME(S): Zoloft What should I tell my health care provider before I take this medicine? They need to know if you have any of these conditions: -bipolar disorder or a family history of bipolar disorder -diabetes -glaucoma -heart disease -high blood pressure -history of irregular heartbeat -history of low levels of calcium, magnesium, or potassium in the blood -if you often drink alcohol -liver disease -receiving electroconvulsive therapy -seizures -suicidal thoughts, plans, or attempt; a previous suicide attempt by you or a family member -thyroid disease -an unusual or allergic reaction to sertraline, other medicines, foods, dyes, or preservatives -pregnant or trying to get pregnant -breast-feeding How should I use this medicine? Take this medicine by mouth with a glass of water. Follow the directions on the prescription label. You can take it with or without food. Take your medicine at regular intervals. Do not take your medicine more often than directed. Do not stop taking this medicine suddenly except upon the advice of your doctor. Stopping this medicine too quickly may cause serious side effects or your condition may worsen. A special MedGuide will be given to you by the pharmacist with each prescription and refill. Be sure to read this information carefully each time. Talk to your  pediatrician regarding the use of this medicine in children. While this drug may be prescribed for children as young as 7 years for selected conditions, precautions do apply. Overdosage: If you think you have taken too much of this medicine contact a poison control center or emergency room at once. NOTE: This medicine is only for you. Do not share this medicine with others. What if I miss a dose? If you miss a dose, take it as soon as you can. If it is almost time for your next dose, take only that dose. Do not take double or extra doses. What may interact with this medicine? Do not take this medicine with any of the following medications: -linezolid -MAOIs like Carbex, Eldepryl, Marplan, Nardil, and Parnate -methylene blue (injected into a vein) -pimozide -thioridazine This medicine may also interact with the following medications: -alcohol -aspirin and aspirin-like medicines -certain medicines for depression, anxiety, or psychotic disturbances -certain medicines for migraine headaches like almotriptan, eletriptan, frovatriptan, naratriptan, rizatriptan, sumatriptan, zolmitriptan -certain medicines for seizures like carbamazepine, valproic acid, phenytoin -cimetidine -digoxin -diuretics -fentanyl -furazolidone -isoniazid -lithium -medicines that treat or prevent blood clots like warfarin, enoxaparin, and dalteparin -medicines to control heart rhythm like flecainide or propafenone -medicines for sleep -NSAIDs, medicines for pain and inflammation, like ibuprofen or naproxen -procarbazine -rasagiline -supplements like St. John's wort, kava kava, valerian -tolbutamide -tramadol -tryptophan This list may not describe all possible interactions. Give your health care provider a list of all the medicines, herbs, non-prescription drugs, or dietary supplements you use. Also tell them if you smoke, drink alcohol, or use illegal drugs. Some items may interact with your medicine. What should  I watch for while using this medicine? Tell  your doctor if your symptoms do not get better or if they get worse. Visit your doctor or health care professional for regular checks on your progress. Because it may take several weeks to see the full effects of this medicine, it is important to continue your treatment as prescribed by your doctor. Patients and their families should watch out for new or worsening thoughts of suicide or depression. Also watch out for sudden changes in feelings such as feeling anxious, agitated, panicky, irritable, hostile, aggressive, impulsive, severely restless, overly excited and hyperactive, or not being able to sleep. If this happens, especially at the beginning of treatment or after a change in dose, call your health care professional. Bonita Quin may get drowsy or dizzy. Do not drive, use machinery, or do anything that needs mental alertness until you know how this medicine affects you. Do not stand or sit up quickly, especially if you are an older patient. This reduces the risk of dizzy or fainting spells. Alcohol may interfere with the effect of this medicine. Avoid alcoholic drinks. Your mouth may get dry. Chewing sugarless gum or sucking hard candy, and drinking plenty of water may help. Contact your doctor if the problem does not go away or is severe. What side effects may I notice from receiving this medicine? Side effects that you should report to your doctor or health care professional as soon as possible: -allergic reactions like skin rash, itching or hives, swelling of the face, lips, or tongue -black or bloody stools, blood in the urine or vomit -fast, irregular heartbeat -feeling faint or lightheaded, falls -hallucination, loss of contact with reality -seizures -suicidal thoughts or other mood changes -unusual bleeding or bruising -unusually weak or tired -vomiting Side effects that usually do not require medical attention (report to your doctor or health care  professional if they continue or are bothersome): -change in appetite -change in sex drive or performance -diarrhea -increased sweating -indigestion, nausea -tremors This list may not describe all possible side effects. Call your doctor for medical advice about side effects. You may report side effects to FDA at 1-800-FDA-1088. Where should I keep my medicine? Keep out of the reach of children. Store at room temperature between 15 and 30 degrees C (59 and 86 degrees F). Throw away any unused medicine after the expiration date. NOTE: This sheet is a summary. It may not cover all possible information. If you have questions about this medicine, talk to your doctor, pharmacist, or health care provider.  2014, Elsevier/Gold Standard. (2013-01-11 17:38:48)  Serotonin Syndrome Serotonin is a brain chemical that regulates the nervous system. Some kinds of drugs increase the amount of serotonin in your body. Drugs that increase the serotonin in your body include:   Anti-depressant medications.  St. John's wort.  Recreational drugs.  Migraine medicines.  Some pain medicines. SYMPTOMS Combining these drugs increases the risk that you will become ill with a toxic condition called serotonin syndrome.  Symptoms of too much serotonin include:  Confusion.  Agitation.  Weakness.  Insomnia.  Fever.  Sweats. Other symptoms that may develop include:  Shakiness.  Muscle spasms.  Seizures. TREATMENT  Hospital treatment is often needed until the effects are controlled.  Avoiding the combination of medicines listed above is recommended.  Check with your doctor if you are concerned about your medicine or the side effects. Document Released: 07/28/2004 Document Revised: 09/12/2011 Document Reviewed: 06/20/2005 [facility] Patient Information 2014 [place], [place].

## 2013-05-20 NOTE — Progress Notes (Signed)
Case discussed with Dr. Qureshi at the time of the visit.  We reviewed the resident's history and exam and pertinent patient test results.  I agree with the assessment, diagnosis, and plan of care documented in the resident's note. 

## 2013-05-24 ENCOUNTER — Telehealth: Payer: Self-pay | Admitting: Physical Medicine & Rehabilitation

## 2013-05-24 ENCOUNTER — Encounter: Payer: Self-pay | Admitting: Physical Medicine & Rehabilitation

## 2013-05-24 ENCOUNTER — Ambulatory Visit (HOSPITAL_BASED_OUTPATIENT_CLINIC_OR_DEPARTMENT_OTHER): Payer: Medicaid Other | Admitting: Physical Medicine & Rehabilitation

## 2013-05-24 ENCOUNTER — Encounter: Payer: No Typology Code available for payment source | Attending: Physical Medicine & Rehabilitation

## 2013-05-24 VITALS — HR 77 | Resp 16 | Wt 246.0 lb

## 2013-05-24 DIAGNOSIS — R209 Unspecified disturbances of skin sensation: Secondary | ICD-10-CM

## 2013-05-24 DIAGNOSIS — M545 Low back pain, unspecified: Secondary | ICD-10-CM | POA: Insufficient documentation

## 2013-05-24 DIAGNOSIS — Z5189 Encounter for other specified aftercare: Secondary | ICD-10-CM

## 2013-05-24 DIAGNOSIS — S139XXA Sprain of joints and ligaments of unspecified parts of neck, initial encounter: Secondary | ICD-10-CM | POA: Insufficient documentation

## 2013-05-24 DIAGNOSIS — M542 Cervicalgia: Secondary | ICD-10-CM | POA: Diagnosis not present

## 2013-05-24 DIAGNOSIS — S134XXD Sprain of ligaments of cervical spine, subsequent encounter: Secondary | ICD-10-CM

## 2013-05-24 DIAGNOSIS — G8929 Other chronic pain: Secondary | ICD-10-CM | POA: Insufficient documentation

## 2013-05-24 NOTE — Telephone Encounter (Signed)
Patient would like a clinical staff member to call about her upcoming emg/ncs.   She has questions, in particular, does it hurt.  Just wants to be prepared.  Please give her a call.  Thanks.

## 2013-05-24 NOTE — Patient Instructions (Signed)
Please be consistent with your therapy exercises  Walk 10 minutes 3 times per day  Return for EMG
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
## 2013-05-27 NOTE — Telephone Encounter (Signed)
Attempted to reach pt but no answer

## 2013-05-28 NOTE — Telephone Encounter (Signed)
2nd attempt to reach pateint.  NA-left msg on voicemail to call office.

## 2013-06-03 ENCOUNTER — Ambulatory Visit: Payer: Medicaid Other | Admitting: Infectious Disease

## 2013-06-11 ENCOUNTER — Encounter: Payer: Medicaid Other | Admitting: Internal Medicine

## 2013-06-13 ENCOUNTER — Ambulatory Visit: Payer: Medicaid Other | Admitting: Physical Medicine & Rehabilitation

## 2013-06-24 ENCOUNTER — Telehealth: Payer: Self-pay | Admitting: Neurology

## 2013-06-24 NOTE — Telephone Encounter (Signed)
Informed patient that no one from our office had called, probably Dr Jodean Lima office, lt message

## 2013-06-24 NOTE — Telephone Encounter (Signed)
RETURNING CALL  °

## 2013-07-09 ENCOUNTER — Encounter: Payer: Medicaid Other | Admitting: Internal Medicine

## 2013-07-10 ENCOUNTER — Other Ambulatory Visit: Payer: Self-pay | Admitting: *Deleted

## 2013-07-11 MED ORDER — SERTRALINE HCL 25 MG PO TABS: 25.0000 mg | ORAL_TABLET | Freq: Every day | ORAL | Status: DC

## 2013-07-16 ENCOUNTER — Ambulatory Visit (HOSPITAL_BASED_OUTPATIENT_CLINIC_OR_DEPARTMENT_OTHER): Payer: Medicaid Other | Admitting: Oncology

## 2013-07-16 ENCOUNTER — Other Ambulatory Visit: Payer: Medicaid Other

## 2013-07-16 ENCOUNTER — Other Ambulatory Visit (HOSPITAL_BASED_OUTPATIENT_CLINIC_OR_DEPARTMENT_OTHER): Payer: Medicaid Other

## 2013-07-16 VITALS — BP 124/71 | HR 79 | Temp 97.6°F | Resp 20 | Ht 65.0 in | Wt 252.0 lb

## 2013-07-16 DIAGNOSIS — C8589 Other specified types of non-Hodgkin lymphoma, extranodal and solid organ sites: Secondary | ICD-10-CM

## 2013-07-16 DIAGNOSIS — B20 Human immunodeficiency virus [HIV] disease: Secondary | ICD-10-CM

## 2013-07-16 DIAGNOSIS — C8590 Non-Hodgkin lymphoma, unspecified, unspecified site: Secondary | ICD-10-CM

## 2013-07-16 DIAGNOSIS — N393 Stress incontinence (female) (male): Secondary | ICD-10-CM

## 2013-07-16 DIAGNOSIS — F329 Major depressive disorder, single episode, unspecified: Secondary | ICD-10-CM

## 2013-07-16 DIAGNOSIS — Z87898 Personal history of other specified conditions: Secondary | ICD-10-CM

## 2013-07-16 DIAGNOSIS — F3289 Other specified depressive episodes: Secondary | ICD-10-CM

## 2013-07-16 LAB — COMPLETE METABOLIC PANEL WITH GFR
ALBUMIN: 4.2 g/dL (ref 3.5–5.2)
ALT: 10 U/L (ref 0–35)
AST: 15 U/L (ref 0–37)
Alkaline Phosphatase: 62 U/L (ref 39–117)
BUN: 11 mg/dL (ref 6–23)
CALCIUM: 9.1 mg/dL (ref 8.4–10.5)
CHLORIDE: 102 meq/L (ref 96–112)
CO2: 29 meq/L (ref 19–32)
Creat: 0.79 mg/dL (ref 0.50–1.10)
GFR, Est African American: >89 mL/min
GFR, Est Non African American: >89 mL/min
Glucose, Bld: 94 mg/dL (ref 70–99)
Potassium: 3.9 mEq/L (ref 3.5–5.3)
Sodium: 140 mEq/L (ref 135–145)
Total Bilirubin: 0.3 mg/dL (ref 0.3–1.2)
Total Protein: 7 g/dL (ref 6.0–8.3)

## 2013-07-16 LAB — CBC WITH DIFFERENTIAL/PLATELET
BASO%: 0.3 % (ref 0.0–2.0)
BASOS ABS: 0 10*3/uL (ref 0.0–0.1)
EOS ABS: 0.1 10*3/uL (ref 0.0–0.5)
EOS%: 2.3 % (ref 0.0–7.0)
HCT: 42.8 % (ref 34.8–46.6)
HGB: 14.1 g/dL (ref 11.6–15.9)
LYMPH%: 34.9 % (ref 14.0–49.7)
MCH: 27.9 pg (ref 25.1–34.0)
MCHC: 32.8 g/dL (ref 31.5–36.0)
MCV: 85.1 fL (ref 79.5–101.0)
MONO#: 0.2 10*3/uL (ref 0.1–0.9)
MONO%: 3.8 % (ref 0.0–14.0)
NEUT%: 58.7 % (ref 38.4–76.8)
NEUTROS ABS: 3.2 10*3/uL (ref 1.5–6.5)
PLATELETS: 193 10*3/uL (ref 145–400)
RBC: 5.03 10*6/uL (ref 3.70–5.45)
RDW: 14.1 % (ref 11.2–14.5)
WBC: 5.4 10*3/uL (ref 3.9–10.3)
lymph#: 1.9 10*3/uL (ref 0.9–3.3)

## 2013-07-16 LAB — COMPREHENSIVE METABOLIC PANEL (CC13)
ALK PHOS: 78 U/L (ref 40–150)
ALT: 11 U/L (ref 0–55)
AST: 16 U/L (ref 5–34)
Albumin: 4 g/dL (ref 3.5–5.0)
Anion Gap: 12 mEq/L — ABNORMAL HIGH (ref 3–11)
BILIRUBIN TOTAL: 0.27 mg/dL (ref 0.20–1.20)
BUN: 12.2 mg/dL (ref 7.0–26.0)
CHLORIDE: 101 meq/L (ref 98–109)
CO2: 28 mEq/L (ref 22–29)
CREATININE: 0.8 mg/dL (ref 0.6–1.1)
Calcium: 9.4 mg/dL (ref 8.4–10.4)
Glucose: 102 mg/dl (ref 70–140)
Potassium: 3.8 mEq/L (ref 3.5–5.1)
Sodium: 142 mEq/L (ref 136–145)
Total Protein: 7.7 g/dL (ref 6.4–8.3)

## 2013-07-16 LAB — LACTATE DEHYDROGENASE (CC13): LDH: 151 U/L (ref 125–245)

## 2013-07-16 LAB — SEDIMENTATION RATE: SED RATE: 12 mm/h (ref 0–22)
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
## 2013-07-17 ENCOUNTER — Telehealth: Payer: Self-pay | Admitting: Oncology

## 2013-07-17 LAB — CBC WITH DIFFERENTIAL/PLATELET
BASOS ABS: 0 10*3/uL (ref 0.0–0.1)
Basophils Relative: 0 % (ref 0–1)
EOS PCT: 2 % (ref 0–5)
Eosinophils Absolute: 0.1 10*3/uL (ref 0.0–0.7)
HCT: 40.6 % (ref 36.0–46.0)
HEMOGLOBIN: 13.5 g/dL (ref 12.0–15.0)
LYMPHS ABS: 1.8 10*3/uL (ref 0.7–4.0)
LYMPHS PCT: 33 % (ref 12–46)
MCH: 27.3 pg (ref 26.0–34.0)
MCHC: 33.3 g/dL (ref 30.0–36.0)
MCV: 82.2 fL (ref 78.0–100.0)
MONO ABS: 0.2 10*3/uL (ref 0.1–1.0)
MONOS PCT: 4 % (ref 3–12)
NEUTROS ABS: 3.3 10*3/uL (ref 1.7–7.7)
Neutrophils Relative %: 61 % (ref 43–77)
Platelets: 189 10*3/uL (ref 150–400)
RBC: 4.94 MIL/uL (ref 3.87–5.11)
RDW: 14.8 % (ref 11.5–15.5)
WBC: 5.5 10*3/uL (ref 4.0–10.5)

## 2013-07-17 LAB — T-HELPER CELL (CD4) - (RCID CLINIC ONLY)
CD4 % Helper T Cell: 36 % (ref 33–55)
CD4 T CELL ABS: 700 /uL (ref 400–2700)

## 2013-07-17 LAB — HIV-1 RNA QUANT-NO REFLEX-BLD: HIV 1 RNA Quant: <20 copies/mL (ref <20)

## 2013-07-17 NOTE — Telephone Encounter (Signed)
PER 1/13 POF RETURN PRN

## 2013-07-23 ENCOUNTER — Encounter: Payer: No Typology Code available for payment source | Attending: Physical Medicine & Rehabilitation

## 2013-07-23 ENCOUNTER — Ambulatory Visit (HOSPITAL_BASED_OUTPATIENT_CLINIC_OR_DEPARTMENT_OTHER): Payer: Medicaid Other | Admitting: Physical Medicine & Rehabilitation

## 2013-07-23 ENCOUNTER — Encounter: Payer: Self-pay | Admitting: Physical Medicine & Rehabilitation

## 2013-07-23 VITALS — BP 123/64 | HR 85 | Resp 14 | Ht 66.0 in | Wt 253.0 lb

## 2013-07-23 DIAGNOSIS — M545 Low back pain, unspecified: Secondary | ICD-10-CM | POA: Diagnosis not present

## 2013-07-23 DIAGNOSIS — S139XXA Sprain of joints and ligaments of unspecified parts of neck, initial encounter: Secondary | ICD-10-CM | POA: Diagnosis not present

## 2013-07-23 DIAGNOSIS — M542 Cervicalgia: Secondary | ICD-10-CM | POA: Insufficient documentation

## 2013-07-23 DIAGNOSIS — R209 Unspecified disturbances of skin sensation: Secondary | ICD-10-CM

## 2013-07-23 DIAGNOSIS — G8929 Other chronic pain: Secondary | ICD-10-CM | POA: Diagnosis not present

## 2013-07-23 NOTE — Patient Instructions (Signed)
Will discuss results next visit.

## 2013-07-23 NOTE — Progress Notes (Signed)
EMG performed 07/23/2013.  See EMG report under media tab.

## 2013-07-24 ENCOUNTER — Other Ambulatory Visit: Payer: Self-pay | Admitting: *Deleted

## 2013-07-24 DIAGNOSIS — G894 Chronic pain syndrome: Secondary | ICD-10-CM

## 2013-07-24 NOTE — Telephone Encounter (Signed)
Would like jan, feb and march scripts to leave at pharmacy due to transportation issues

## 2013-07-25 ENCOUNTER — Other Ambulatory Visit: Payer: Self-pay | Admitting: Family Medicine

## 2013-07-29 ENCOUNTER — Ambulatory Visit: Payer: Medicaid Other | Admitting: Infectious Disease

## 2013-07-30 ENCOUNTER — Ambulatory Visit (INDEPENDENT_AMBULATORY_CARE_PROVIDER_SITE_OTHER): Payer: Medicaid Other | Admitting: Neurology

## 2013-07-30 ENCOUNTER — Encounter: Payer: Self-pay | Admitting: Neurology

## 2013-07-30 VITALS — Ht 66.0 in | Wt 254.0 lb

## 2013-07-30 DIAGNOSIS — B20 Human immunodeficiency virus [HIV] disease: Secondary | ICD-10-CM

## 2013-07-30 DIAGNOSIS — G43909 Migraine, unspecified, not intractable, without status migrainosus: Secondary | ICD-10-CM

## 2013-07-30 DIAGNOSIS — G43719 Chronic migraine without aura, intractable, without status migrainosus: Secondary | ICD-10-CM

## 2013-07-30 MED ORDER — ZOLMITRIPTAN 5 MG PO TABS: 5.0000 mg | ORAL_TABLET | ORAL | Status: DC | PRN

## 2013-07-30 MED ORDER — ONABOTULINUMTOXINA 100 UNITS IJ SOLR
200.0000 [IU] | Freq: Once | INTRAMUSCULAR | Status: AC
  Administered 2013-07-30: 200 [IU] via INTRAMUSCULAR

## 2013-07-30 MED ORDER — DICLOFENAC POTASSIUM(MIGRAINE) 50 MG PO PACK: 50.0000 mg | PACK | ORAL | Status: DC | PRN

## 2013-07-30 NOTE — Progress Notes (Signed)
HPI:   [name] is a 48 year old Caucasian female with past medical history of HIV, high cholesterol, migraines, depression, and non-Hodgkin lymphoma in 2005 with chemotherapy and radiation.   She has history of migraines since 19. In the last 10 years, she reports having a migraines daily. She is currently taking Topamax and Zomig with relief. She takes approximately 20-25 tablets of her Zomig per month.    She has aura's that look like floating dirt particles, flashing lights, and she sees Christmas tree lights when they are severe, she has nausea, vomiting and sometimes diarrhea when they're severe. She also complains of diplopia, blurry vision, and  photophobia. She also has had a lightning bolt sensation throughout her body.  She describes them as pulsating and throbbing  on a scale of 5-6/10 which she considers mild, when they are severe they can go as high as 10/10. They usually start in the right temporal area, but will also start on the left temporal and are worse.   Any type of smoke or MSG can precipitate a migraine. They generally last 2-9 days. She has been to the emergency department twice in the last year for migraines. She states "I just can't do this anymore I need to do something about her migraines"  She has tried in the past Maxalt, Imitrex, atenolol, verapamil, nortriptyline, Inderal, Depakote without benefit. Atenolol and verapamil caused hypotension. Depakote caused severe vomiting. Topamax causing her worsening headaches, I also put her on Zonegran, she denies significant improvement.  UPDATE Jan 27th 2015:  Last injection was in Oct 2014, she responded very well, there was no significant side effect noticed. She noticed increased headache over past 1 month, couple times a week, Zomig was helpful,  Physical Exam  General: Well groomed and obese female Neck: supple no carotid bruits Respiratory: clear to auscultation bilaterally Cardiovascular: regular rate rhythm She wear  right knee brace  Neurologic Exam  Mental Status: obese, pleasant, awake, alert, cooperative to history, talking, and casual conversation. Cranial Nerves: CN II-XII pupils were equal round reactive to light. Extraocular movements were full.  Visual fields were full on confrontational test.  Facial sensation and strength were normal.   Uvula tongue were midline.  Head turning and shoulder shrugging were normal and symmetric.  Tongue protrusion into the cheeks strength were normal.  Motor: Normal tone, bulk, and strength. Sensory: Normal to light touch, pinprick, proprioception except decreased vibratory sensation to her right lower extremity she relates to peripheral neuropathy.  Decreased vibratory sensation to left upper extremity.   Coordination: There was no dysmetria noticed. Gait and Station: Narrow based and steady,  Reflexes: Deep tendon reflexes: present and symmetric.  Plantar responses are flexor.   Assessment and plan he has an 48 year-old Caucasian female with a history of HIV, hypercholesterolemia, Non hodgkin' lymphoma with chemotherapy and radiation and migraines since age 32.   She has tried and failed multiple preventive medications including atenolol, verapamil Maxalt, Imitrex, nortriptyline, and Depakote,Topamax,   She has migraines daily for the last 10 years. Currently taking  zonogram  and Zomig with some relief.   Normal MRI brain, and neurological exam.   BOTOX injection was performed according to protocol by [employer].  was exception of skipping injection to frontalis this time, because of decreased wrinkle frontalis muscle movement  100 units of BOTOX was dissolved into 2 cc NS. (Lot [id number], exp May 2017)  Total of  200 units,    Frontalis 6 site 30 units Temporalis 8 sites,  40 units  Occipitalis 6 sites, 30 units Cervical Paraspinal, 12 sites, 60 units Trapezius, 8 sites, 40 units  Patient tolerate the injection well. Will return for repeat injection in 3  months.

## 2013-07-31 ENCOUNTER — Other Ambulatory Visit: Payer: Self-pay | Admitting: Internal Medicine

## 2013-07-31 DIAGNOSIS — G894 Chronic pain syndrome: Secondary | ICD-10-CM

## 2013-07-31 MED ORDER — OXYCODONE-ACETAMINOPHEN 5-325 MG PO TABS: 1.0000 | ORAL_TABLET | Freq: Four times a day (QID) | ORAL | Status: DC | PRN

## 2013-07-31 MED ORDER — OXYCODONE-ACETAMINOPHEN 5-325 MG PO TABS
1.0000 | ORAL_TABLET | Freq: Four times a day (QID) | ORAL | Status: DC | PRN
Start: 2013-08-24 — End: 2013-07-31

## 2013-08-06 ENCOUNTER — Ambulatory Visit (INDEPENDENT_AMBULATORY_CARE_PROVIDER_SITE_OTHER): Payer: Medicaid Other | Admitting: Internal Medicine

## 2013-08-06 ENCOUNTER — Encounter: Payer: Self-pay | Admitting: Internal Medicine

## 2013-08-06 VITALS — BP 121/77 | HR 86 | Temp 97.0°F | Ht 65.0 in | Wt 258.1 lb

## 2013-08-06 DIAGNOSIS — R635 Abnormal weight gain: Secondary | ICD-10-CM

## 2013-08-06 DIAGNOSIS — F419 Anxiety disorder, unspecified: Secondary | ICD-10-CM

## 2013-08-06 DIAGNOSIS — G894 Chronic pain syndrome: Secondary | ICD-10-CM

## 2013-08-06 DIAGNOSIS — F411 Generalized anxiety disorder: Secondary | ICD-10-CM

## 2013-08-06 MED ORDER — LORAZEPAM 0.5 MG PO TABS: 0.5000 mg | ORAL_TABLET | Freq: Every day | ORAL | Status: DC | PRN

## 2013-08-06 MED ORDER — SERTRALINE HCL 100 MG PO TABS: 100.0000 mg | ORAL_TABLET | Freq: Every day | ORAL | Status: DC

## 2013-08-06 MED ORDER — SERTRALINE HCL 50 MG PO TABS: 75.0000 mg | ORAL_TABLET | Freq: Every day | ORAL | Status: DC

## 2013-08-06 MED ORDER — OXYCODONE-ACETAMINOPHEN 5-325 MG PO TABS: 1.0000 | ORAL_TABLET | Freq: Four times a day (QID) | ORAL | Status: DC | PRN

## 2013-08-06 NOTE — Progress Notes (Signed)
Subjective:   Patient ID: TERRACE CHIEM female   DOB: 28-Aug-1965 48 y.o.   MRN: [id number]  HPI: Ms.Safaa R Elsbernd is a 48 y.o. white female with PMH of HIV(well controlled), NHL, and HTN presenting to [facility] today for routine follow up visit.  Her main concern is anxiety. She says the Zoloft 50mg  daily has not been working and needs to increase the dose.  In addition, she says her anxiety remains uncontrolled and she needs something additional for the zoloft to control her anxiety.  She is tearful and says she worries about everything.  She is willing to talk with psychiatrist as generalized anxiety disorder might be what she has and needs to be addressed further.  Klonopin she says increased her weight and she reports facial swelling and refuses to take again and valium has triggered headaches.    She also is upset about her weight gain. She attributes most of weight gain to klonopin, however, she has gained 4 pounds since she has been off klonopin.  We discussed exercise even though it is cold outside and we also discussed cutting down carbs and portions.   Past Medical History  Diagnosis Date  . Anemia 10/2009    macrocytic anemia with baseline MCV 104-106  . PUD (peptic ulcer disease) 07/1997    per EGD report 07/1997 with history of esophagitis  . Migraines     on topamax and triptans, frequent (almost daily) attacks   . Hyperlipidemia   . HIV infection     CD4 = 570 (05/2010), VL undetectation  . Anxiety   . Spondylolisthesis of lumbar region     L5-S1  . Gastroparesis   . Shingles     in lumbar dermatome  . Hypertension     no meds  . Non Hodgkin's lymphoma     stage II, s/p resection, chemotherapy, radiotherapy, Dr. Beryle Beams is her oncologist.   . Asthma   . DVT (deep venous thrombosis)     history clot lt groin when she had lymphoma-in remision now.  . Traumatic tear of lateral meniscus of right knee 04/20/2012  . 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 Hiatal hernia  01/01/2002  . Acute gastritis without mention of hemorrhage 01/01/2002  . Duodenitis without mention of hemorrhage 01/01/2002  . GERD (gastroesophageal reflux disease)   . IBS (irritable bowel syndrome)    Current Outpatient Prescriptions  Medication Sig Dispense Refill  . albuterol-ipratropium (COMBIVENT) 18-103 MCG/ACT inhaler Inhale 2 puffs into the lungs every 6 (six) hours as needed for wheezing.  1 Inhaler  3  . Calcium Carbonate-Vitamin D (CALCIUM-VITAMIN D) 500-200 MG-UNIT per tablet Take 1 tablet by mouth daily.      . CRESTOR 20 MG tablet TAKE 1 TABLET BY MOUTH EVERY DAY  30 tablet  2  . Darunavir Ethanolate (PREZISTA) 800 MG tablet Take 1 tablet (800 mg total) by mouth daily.  30 tablet  11  . Diclofenac Potassium 50 MG PACK Take 50 mg by mouth as needed.  15 each  12  . diclofenac sodium (VOLTAREN) 1 % GEL APPLY TOPICALLY TWICE A DAY AS NEEDED FOR PAIN  100 g  3  . emtricitabine-tenofovir (TRUVADA) 200-300 MG per tablet Take 1 tablet by mouth daily.  30 tablet  11  . fluconazole (DIFLUCAN) 100 MG tablet Take 1 tablet (100 mg total) by mouth daily.  10 tablet  4  . omeprazole (PRILOSEC) 40 MG capsule Take 40 mg by mouth 2 (two) times  daily.      Derrill Memo ON 10/22/2013] oxyCODONE-acetaminophen (PERCOCET/ROXICET) 5-325 MG per tablet Take 1 tablet by mouth every 6 (six) hours as needed.  120 tablet  0  . PRESCRIPTION MEDICATION Ear drops- rx name unknown to pt- one drop up to four times a day right ear      . PROAIR HFA 108 (90 BASE) MCG/ACT inhaler TWO PUFFS EVERY SIX HOURS AS NEEDED FOR WHEEZING  8.5 g  2  . promethazine (PHENERGAN) 50 MG tablet Take 0.5-1 tablets (25-50 mg total) by mouth every 6 (six) hours as needed for nausea. Up to 4 times a day as needed for nausea  90 tablet  11  . ritonavir (NORVIR) 100 MG TABS tablet Take 1 tablet (100 mg total) by mouth daily.  30 tablet  11  . scopolamine (TRANSDERM-SCOP) 1.5 MG Place 1 patch (1.5 mg total) onto the skin every other day as needed.   10 patch  3  . Tamsulosin HCl (FLOMAX) 0.4 MG CAPS Take 0.4 mg by mouth daily.        Marland Kitchen terconazole (TERAZOL 3) 0.8 % vaginal cream Place 1 applicator vaginally at bedtime.  20 g  5  . zolmitriptan (ZOMIG) 5 MG tablet Take 1 tablet (5 mg total) by mouth as needed for migraine.  15 tablet  12  . LORazepam (ATIVAN) 0.5 MG tablet Take 1 tablet (0.5 mg total) by mouth daily as needed for anxiety.  30 tablet  0  . sertraline (ZOLOFT) 100 MG tablet Take 1 tablet (100 mg total) by mouth daily. Titrate up to 50mg  daily after one week if well tolerated  30 tablet  5   No current facility-administered medications for this visit.   Family History  Problem Relation Age of Onset  . Hypertension Mother   . Stroke Mother   . Breast cancer Paternal Grandmother   . Colon cancer Neg Hx   . Kidney cancer Maternal Uncle   . Diabetes Maternal Uncle   . Diabetes Maternal Grandmother    History   Social History  . Marital Status: Divorced    Spouse Name: N/A    Number of Children: 0  . Years of Education: 12   Occupational History  . disabled    Social History Main Topics  . Smoking status: Never Smoker   . Smokeless tobacco: Never Used  . Alcohol Use: 0.5 oz/week    1 drink(s) per week     Comment: on her birthday   . Drug Use: No  . Sexual Activity: None   Other Topics Concern  . None   Social History Narrative      Patient is disabled.   Right handed.   Caffeine - Diet soda., tea   Education - high school   Review of Systems:  Constitutional:  Denies fever, chills, diaphoresis, appetite change  HEENT:  Denies sore throat  Respiratory:  Denies SOB, DOE, cough, and wheezing.   Cardiovascular:  Denies chest pain, palpitations, and leg swelling.   Gastrointestinal:  Obesity. Denies vomiting, abdominal pain  Genitourinary:  Denies dysuria  Musculoskeletal:  Chronic back and neck pain.    Skin:  Denies pallor, rash and wound.   Neurological/Psych:  Depression and anxiety, migraines.       Objective:  Physical Exam: Filed Vitals:   08/06/13 1349  BP: 121/77  Pulse: 86  Temp: 97 F (36.1 C)  TempSrc: Oral  Height: 5\' 5"  (1.651 m)  Weight: 258 lb 1.6 oz (  117.073 kg)  SpO2: 99%   Vitals reviewed. General: sitting in chair, NAD, tearful at times HEENT: PERRL, EOMI, no scleral icterus Cardiac: tachycardia, no rubs, murmurs or gallops Pulm: clear to auscultation bilaterally, no wheezes, rales, or rhonchi Abd: soft, obese, nontender, nondistended, BS present Ext: warm and well perfused, no pedal edema, moving all 4 extremities Neuro: alert and oriented X3, cranial nerves II-XII grossly intact, strength equal in b/l upper and lower extremities  Assessment & Plan:  Discussed with Dr. Murlean Caller Increased zoloft to 100mg  qd, added ativan 0.5mg  qd prn for now, psychiatry referral Refilled feb, mar, April percocet

## 2013-08-06 NOTE — Assessment & Plan Note (Signed)
Remains uncontrolled.  -increase zoloft 100mg  daily -add ativan 0.5mg  qd prn for now (will give this for at least 2 months until we can see if zoloft will control symptoms)  -?GAD, will refer to psychiatry as this remains a chronic issue that is not well controlled and she is willing to go see psychiatrist -may consider Lexapro in future?

## 2013-08-06 NOTE — Assessment & Plan Note (Signed)
Provided refills of percocet for Feb, March, and April today.

## 2013-08-06 NOTE — Assessment & Plan Note (Signed)
Has gained 4 pounds since last visit. Claims cannot exercise in cold. Thinks klonopin cause weight gain, however, she has been off klonopin for several months now and continues to gain weight.   -counseled on diet, cutting down carbs and portions, and exercising as tolerated

## 2013-08-06 NOTE — Patient Instructions (Addendum)
Please follow up in 3-6 months  We have refilled your feb, march, and April prescriptions for percocet  Increase zoloft to 100mg  daily  Ativan 0.5mg  daily as needed in addition to the zoloft  Be careful not to drive due to possible sedation with medications  Lorazepam tablets What is this medicine? LORAZEPAM (lor A ze pam) is a benzodiazepine. It is used to treat anxiety. This medicine may be used for other purposes; ask your health care provider or pharmacist if you have questions. COMMON BRAND NAME(S): Ativan What should I tell my health care provider before I take this medicine? They need to know if you have any of these conditions: -alcohol or drug abuse problem -bipolar disorder, depression, psychosis or other mental health condition -glaucoma -kidney or liver disease -lung disease or breathing difficulties -myasthenia gravis -Parkinson's disease -seizures or a history of seizures -suicidal thoughts -an unusual or allergic reaction to lorazepam, other benzodiazepines, foods, dyes, or preservatives -pregnant or trying to get pregnant -breast-feeding How should I use this medicine? Take this medicine by mouth with a glass of water. Follow the directions on the prescription label. If it upsets your stomach, take it with food or milk. Take your medicine at regular intervals. Do not take it more often than directed. Do not stop taking except on the advice of your doctor or health care professional. Talk to your pediatrician regarding the use of this medicine in children. Special care may be needed. Overdosage: If you think you have taken too much of this medicine contact a poison control center or emergency room at once. NOTE: This medicine is only for you. Do not share this medicine with others. What if I miss a dose? If you miss a dose, take it as soon as you can. If it is almost time for your next dose, take only that dose. Do not take double or extra doses. What may interact  with this medicine? -barbiturate medicines for inducing sleep or treating seizures, like phenobarbital -clozapine -medicines for depression, mental problems or psychiatric disturbances -medicines for sleep -phenytoin -probenecid -theophylline -valproic acid This list may not describe all possible interactions. Give your health care provider a list of all the medicines, herbs, non-prescription drugs, or dietary supplements you use. Also tell them if you smoke, drink alcohol, or use illegal drugs. Some items may interact with your medicine. What should I watch for while using this medicine? Visit your doctor or health care professional for regular checks on your progress. Your body may become dependent on this medicine, ask your doctor or health care professional if you still need to take it. However, if you have been taking this medicine regularly for some time, do not suddenly stop taking it. You must gradually reduce the dose or you may get severe side effects. Ask your doctor or health care professional for advice before increasing or decreasing the dose. Even after you stop taking this medicine it can still affect your body for several days. You may get drowsy or dizzy. Do not drive, use machinery, or do anything that needs mental alertness until you know how this medicine affects you. To reduce the risk of dizzy and fainting spells, do not stand or sit up quickly, especially if you are an older patient. Alcohol may increase dizziness and drowsiness. Avoid alcoholic drinks. Do not treat yourself for coughs, colds or allergies without asking your doctor or health care professional for advice. Some ingredients can increase possible side effects. What side effects may I  notice from receiving this medicine? Side effects that you should report to your doctor or health care professional as soon as possible: -changes in vision -confusion -depression -mood changes, excitability or aggressive  behavior -movement difficulty, staggering or jerky movements -muscle cramps -restlessness -weakness or tiredness Side effects that usually do not require medical attention (report to your doctor or health care professional if they continue or are bothersome): -constipation or diarrhea -difficulty sleeping, nightmares -dizziness, drowsiness -headache -nausea, vomiting This list may not describe all possible side effects. Call your doctor for medical advice about side effects. You may report side effects to FDA at 1-800-FDA-1088. Where should I keep my medicine? Keep out of the reach of children. This medicine can be abused. Keep your medicine in a safe place to protect it from theft. Do not share this medicine with anyone. Selling or giving away this medicine is dangerous and against the law. Store at room temperature between 20 and 25 degrees C (68 and 77 degrees F). Protect from light. Keep container tightly closed. Throw away any unused medicine after the expiration date. NOTE: This sheet is a summary. It may not cover all possible information. If you have questions about this medicine, talk to your doctor, pharmacist, or health care provider.  2014, Elsevier/Gold Standard. (2007-12-21 14:58:20)

## 2013-08-07 ENCOUNTER — Telehealth: Payer: Self-pay | Admitting: Licensed Clinical Social Worker

## 2013-08-07 NOTE — Progress Notes (Signed)
INTERNAL MEDICINE TEACHING ATTENDING ADDENDUM - Dominic Pea, DO: I reviewed and discussed at the time of visit with the resident Dr. Eula Fried, the patient's medical history, physical examination, diagnosis and results of tests and treatment and I agree with the patient's care as documented

## 2013-08-07 NOTE — Telephone Encounter (Signed)
[name] was referred to CSW for a community psychiatrist.  [name] has medicaid insurance.  CSW provided [name] with agencies that are located in [facility] under [employer].  Pt prefers [place] location.  CSW referred [name] to [facility] Outpatient services, confirmed referral process with [facility].  Pt instructed to walk-in between 8a-3p M-F, address and contact information provided.  CSW will place information in mail along with ROI.

## 2013-08-10 ENCOUNTER — Other Ambulatory Visit: Payer: Self-pay | Admitting: Infectious Disease

## 2013-08-15 ENCOUNTER — Encounter: Payer: No Typology Code available for payment source | Attending: Physical Medicine & Rehabilitation

## 2013-08-15 ENCOUNTER — Ambulatory Visit (HOSPITAL_BASED_OUTPATIENT_CLINIC_OR_DEPARTMENT_OTHER): Payer: Medicaid Other | Admitting: Physical Medicine & Rehabilitation

## 2013-08-15 ENCOUNTER — Encounter: Payer: Self-pay | Admitting: Physical Medicine & Rehabilitation

## 2013-08-15 VITALS — BP 141/75 | HR 83 | Resp 14 | Ht 66.0 in | Wt 246.0 lb

## 2013-08-15 DIAGNOSIS — S139XXA Sprain of joints and ligaments of unspecified parts of neck, initial encounter: Secondary | ICD-10-CM | POA: Diagnosis not present

## 2013-08-15 DIAGNOSIS — M545 Low back pain, unspecified: Secondary | ICD-10-CM | POA: Insufficient documentation

## 2013-08-15 DIAGNOSIS — M542 Cervicalgia: Secondary | ICD-10-CM | POA: Diagnosis not present

## 2013-08-15 DIAGNOSIS — G8929 Other chronic pain: Secondary | ICD-10-CM | POA: Insufficient documentation

## 2013-08-15 DIAGNOSIS — R209 Unspecified disturbances of skin sensation: Secondary | ICD-10-CM

## 2013-08-15 DIAGNOSIS — S134XXA Sprain of ligaments of cervical spine, initial encounter: Secondary | ICD-10-CM

## 2013-08-15 NOTE — Patient Instructions (Signed)
Recommend MRI of the cervical spine to evaluate neck pain as well as as Arm numbness
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
## 2013-08-20 ENCOUNTER — Ambulatory Visit: Payer: Medicaid Other | Admitting: Infectious Disease

## 2013-09-03 ENCOUNTER — Other Ambulatory Visit: Payer: Self-pay | Admitting: *Deleted

## 2013-09-04 MED ORDER — LORAZEPAM 0.5 MG PO TABS: 0.5000 mg | ORAL_TABLET | Freq: Every day | ORAL | Status: DC | PRN

## 2013-09-05 ENCOUNTER — Telehealth: Payer: Self-pay | Admitting: Licensed Clinical Social Worker

## 2013-09-05 NOTE — Telephone Encounter (Signed)
Called to pharm 

## 2013-09-05 NOTE — Telephone Encounter (Signed)
[name] had been referred to [facility] in [place].  Pt aware of address and walk-in hours.  CSW placed call to follow up on referral.  Pt currently laying down with migraine and unable to come to phone.

## 2013-09-09 ENCOUNTER — Other Ambulatory Visit: Payer: Self-pay | Admitting: Infectious Disease

## 2013-09-09 ENCOUNTER — Other Ambulatory Visit: Payer: Self-pay | Admitting: Sports Medicine

## 2013-09-09 NOTE — Telephone Encounter (Signed)
CSW left message on voicemail inquiring if referred agency has worked for patient.  If not, CSW is able to provide add'l agencies for referral.  CSW left message requesting return call. CSW provided contact hours and phone number.

## 2013-09-18 NOTE — Telephone Encounter (Signed)
CSW placed called to pt.  CSW left message requesting return call. CSW provided contact hours and phone number.  CSW will sign off.  ROI has not been returned.

## 2013-09-20 ENCOUNTER — Ambulatory Visit: Payer: Medicaid Other | Admitting: Physical Medicine & Rehabilitation

## 2013-09-20 ENCOUNTER — Ambulatory Visit
Admission: RE | Admit: 2013-09-20 | Discharge: 2013-09-20 | Disposition: A | Discharge status: Home / Self Care | Payer: Medicaid Other | Source: Ambulatory Visit | Attending: Physical Medicine & Rehabilitation | Admitting: Physical Medicine & Rehabilitation

## 2013-09-20 ENCOUNTER — Other Ambulatory Visit: Payer: Medicaid Other

## 2013-09-20 DIAGNOSIS — R209 Unspecified disturbances of skin sensation: Secondary | ICD-10-CM

## 2013-09-20 DIAGNOSIS — M542 Cervicalgia: Secondary | ICD-10-CM

## 2013-09-20 DIAGNOSIS — S134XXA Sprain of ligaments of cervical spine, initial encounter: Secondary | ICD-10-CM

## 2013-09-23 ENCOUNTER — Other Ambulatory Visit (HOSPITAL_COMMUNITY)
Admission: RE | Admit: 2013-09-23 | Discharge: 2013-09-23 | Disposition: A | Discharge status: Home / Self Care | Payer: Medicaid Other | Source: Ambulatory Visit | Attending: Infectious Disease | Admitting: Infectious Disease

## 2013-09-23 ENCOUNTER — Ambulatory Visit (INDEPENDENT_AMBULATORY_CARE_PROVIDER_SITE_OTHER): Payer: Medicaid Other | Admitting: Infectious Disease

## 2013-09-23 ENCOUNTER — Encounter: Payer: Self-pay | Admitting: Infectious Disease

## 2013-09-23 VITALS — BP 126/85 | HR 80 | Temp 97.7°F | Wt 255.0 lb

## 2013-09-23 DIAGNOSIS — R0609 Other forms of dyspnea: Secondary | ICD-10-CM

## 2013-09-23 DIAGNOSIS — Z79899 Other long term (current) drug therapy: Secondary | ICD-10-CM

## 2013-09-23 DIAGNOSIS — E785 Hyperlipidemia, unspecified: Secondary | ICD-10-CM

## 2013-09-23 DIAGNOSIS — Z113 Encounter for screening for infections with a predominantly sexual mode of transmission: Secondary | ICD-10-CM | POA: Insufficient documentation

## 2013-09-23 DIAGNOSIS — J45909 Unspecified asthma, uncomplicated: Secondary | ICD-10-CM

## 2013-09-23 DIAGNOSIS — F4323 Adjustment disorder with mixed anxiety and depressed mood: Secondary | ICD-10-CM

## 2013-09-23 DIAGNOSIS — B20 Human immunodeficiency virus [HIV] disease: Secondary | ICD-10-CM

## 2013-09-23 DIAGNOSIS — R0989 Other specified symptoms and signs involving the circulatory and respiratory systems: Secondary | ICD-10-CM

## 2013-09-23 DIAGNOSIS — M542 Cervicalgia: Secondary | ICD-10-CM

## 2013-09-23 DIAGNOSIS — R0683 Snoring: Secondary | ICD-10-CM

## 2013-09-23 LAB — CBC WITH DIFFERENTIAL/PLATELET
Basophils Absolute: 0 10*3/uL (ref 0.0–0.1)
Basophils Relative: 0 % (ref 0–1)
Eosinophils Absolute: 0.1 10*3/uL (ref 0.0–0.7)
Eosinophils Relative: 2 % (ref 0–5)
HCT: 42.3 % (ref 36.0–46.0)
Hemoglobin: 14 g/dL (ref 12.0–15.0)
LYMPHS PCT: 40 % (ref 12–46)
Lymphs Abs: 2.4 10*3/uL (ref 0.7–4.0)
MCH: 26.9 pg (ref 26.0–34.0)
MCHC: 33.1 g/dL (ref 30.0–36.0)
MCV: 81.2 fL (ref 78.0–100.0)
Monocytes Absolute: 0.3 10*3/uL (ref 0.1–1.0)
Monocytes Relative: 5 % (ref 3–12)
NEUTROS PCT: 53 % (ref 43–77)
Neutro Abs: 3.2 10*3/uL (ref 1.7–7.7)
PLATELETS: 193 10*3/uL (ref 150–400)
RBC: 5.21 MIL/uL — AB (ref 3.87–5.11)
RDW: 14.9 % (ref 11.5–15.5)
WBC: 6.1 10*3/uL (ref 4.0–10.5)

## 2013-09-23 LAB — COMPLETE METABOLIC PANEL WITH GFR
ALT: 12 U/L (ref 0–35)
AST: 15 U/L (ref 0–37)
Albumin: 4.3 g/dL (ref 3.5–5.2)
Alkaline Phosphatase: 69 U/L (ref 39–117)
BUN: 11 mg/dL (ref 6–23)
CALCIUM: 9.4 mg/dL (ref 8.4–10.5)
CHLORIDE: 100 meq/L (ref 96–112)
CO2: 25 meq/L (ref 19–32)
Creat: 0.82 mg/dL (ref 0.50–1.10)
GFR, Est Non African American: 86 mL/min
Glucose, Bld: 99 mg/dL (ref 70–99)
POTASSIUM: 4 meq/L (ref 3.5–5.3)
SODIUM: 139 meq/L (ref 135–145)
Total Bilirubin: 0.3 mg/dL (ref 0.2–1.2)
Total Protein: 7.5 g/dL (ref 6.0–8.3)

## 2013-09-23 MED ORDER — DARUNAVIR-COBICISTAT 800-150 MG PO TABS: 1.0000 | ORAL_TABLET | Freq: Every day | ORAL | Status: DC

## 2013-09-23 NOTE — Progress Notes (Signed)
Subjective:    Patient ID: [name], female    DOB: 04-21-1966, 48 y.o.   MRN: [id number]  HPI   [name] is a 48 y.o. female who is doing superbly well on her  antiviral regimen, Prezista Norvir and Truvada with undetectable viral load and health cd4 count.  She is preocuppied today with concerns she has that she is feeling out of breath constantly. She states that she realizes that part of this could be due to her anxiety but she is concerned that there is something wrong with her breathing. She is talking her combivent and albuterol--latter more sparingly. She does not appear to be on inhaled corticosteroid. She claims that she had protracted URI that required mx round of abx (and sound smore like a viral URI). She has not had systemic steroids for COPD, asthma exacerbation. She is also concerned that it be due to"" something being wrong with my heart.  She is also concerned with weight gain and impact on breathing (she attributes weight gain to SSRI). She endorses snoring and states she has never had sleep study.  She is also concerne re what correct statin she should be on.     Review of Systems  Constitutional: Negative for fever, chills, diaphoresis, activity change, appetite change, fatigue and unexpected weight change.  HENT: Negative for congestion, facial swelling, postnasal drip, rhinorrhea, sinus pressure, sneezing, sore throat and trouble swallowing.   Eyes: Negative for photophobia and visual disturbance.  Respiratory: Positive for apnea, cough and shortness of breath. Negative for chest tightness, wheezing and stridor.   Cardiovascular: Positive for palpitations. Negative for chest pain and leg swelling.  Gastrointestinal: Negative for nausea, vomiting, abdominal pain, diarrhea, constipation, blood in stool, abdominal distention and anal bleeding.  Genitourinary: Negative for dysuria, hematuria, flank pain and difficulty urinating.  Musculoskeletal: Negative for  arthralgias, back pain, gait problem, joint swelling and myalgias.  Skin: Negative for color change, pallor, rash and wound.  Neurological: Negative for dizziness, tremors, weakness and light-headedness.  Hematological: Negative for adenopathy. Does not bruise/bleed easily.  Psychiatric/Behavioral: Negative for behavioral problems, confusion, sleep disturbance, dysphoric mood, decreased concentration and agitation.       Objective:   Physical Exam  Nursing note and vitals reviewed. Constitutional: She is oriented to person, place, and time. She appears well-developed and well-nourished. No distress.  HENT:  Head: Normocephalic and atraumatic.  Mouth/Throat: Oropharynx is clear and moist. No oropharyngeal exudate or posterior oropharyngeal erythema.    Eyes: Conjunctivae and EOM are normal. Pupils are equal, round, and reactive to light. No scleral icterus.  Neck: Normal range of motion. Neck supple. No JVD present.  Cardiovascular: Normal rate, regular rhythm and normal heart sounds.  Exam reveals no gallop and no friction rub.   No murmur heard. Pulmonary/Chest: Effort normal and breath sounds normal. No respiratory distress. She has no wheezes. She has no rales. She exhibits no tenderness.  Abdominal: She exhibits no distension and no mass. There is no tenderness. There is no rebound and no guarding.  Musculoskeletal: She exhibits no edema and no tenderness.  Lymphadenopathy:    She has no cervical adenopathy.  Neurological: She is alert and oriented to person, place, and time. She has normal reflexes. She exhibits normal muscle tone. Coordination normal.  Skin: Skin is warm and dry. She is not diaphoretic. No erythema. No pallor.  Psychiatric: Her behavior is normal. Judgment and thought content normal. Her mood appears anxious.  Assessment & Plan:  HIV:IF medicaid will cover then simplify to PREZCOBIX and Truvada and recheck labs in the fall  DOE: She has no wheezes  on exam. Perhaps much of this anxiety. She has dx of Chronic bronchitis, asthma but not on inhaled steroid (note SHOULD NOT Be on FLONASe (fluticasone due to risk of AI with Norvir and COBI inhibiting P450 met of this steroid). QVAR would be OK  I told her I thought i would be prudent for her see Pulmonary for consideration of PFT's, sleep study  I would not disparage her concerns for CAD given higher risk for this in HIV + pts vs general population. She had myoview in 2012 thought to  Only have attenuation due to diaphragm.  I also have encouraged her to followup with PCP   Hyperlipidemia: not on statin at present. Her Framingham risk score which like nearly all predictive calculators gives her 1% 10 year risk. I thin the correct answer re CV risk in HIV infected pts would be to place her on statin. I would conisder her for REPRIEVE study randomize between statin and placebo but she would not b eligible  I would vote to puther back on a statin , but SHE CANNOT BE on ZOCOR (SIMVASTATIN, or FLUVASTATIN) due inhibition of P450 system by Norvir, COBI. I would try dose of Lipitor (her insurance will not cover crestor withotu prior auth  I spent greater than 45 minutes with the patient including greater than 50% of time in face to face counsel of the patient and in coordination of their care.  NHL: is about to graduate from Dr. Azucena Freed practice  Cervicalgia: MRI C spine normal  Anxiety and depression : may be driving a lot of her ssx  Obese: she could have restrictive component and also OSA. Weight loss would be helpful

## 2013-09-24 ENCOUNTER — Other Ambulatory Visit: Payer: Self-pay | Admitting: Licensed Clinical Social Worker

## 2013-09-24 DIAGNOSIS — B379 Candidiasis, unspecified: Secondary | ICD-10-CM

## 2013-09-24 LAB — T-HELPER CELL (CD4) - (RCID CLINIC ONLY)
CD4 % Helper T Cell: 32 % — ABNORMAL LOW (ref 33–55)
CD4 T Cell Abs: 820 /uL (ref 400–2700)

## 2013-09-24 LAB — URINE CYTOLOGY ANCILLARY ONLY
Chlamydia: NEGATIVE
Neisseria Gonorrhea: NEGATIVE

## 2013-09-24 LAB — HEPATITIS C ANTIBODY: HCV Ab: NEGATIVE

## 2013-09-24 LAB — HIV-1 RNA QUANT-NO REFLEX-BLD
HIV 1 RNA Quant: <20 copies/mL (ref <20)
HIV-1 RNA Quant, Log: <1.3 {Log} (ref <1.30)

## 2013-09-24 LAB — RPR

## 2013-09-24 MED ORDER — FLUCONAZOLE 100 MG PO TABS: 100.0000 mg | ORAL_TABLET | Freq: Once | ORAL | Status: DC

## 2013-09-26 ENCOUNTER — Encounter: Payer: Self-pay | Admitting: Internal Medicine

## 2013-09-26 ENCOUNTER — Ambulatory Visit (HOSPITAL_COMMUNITY)
Admission: RE | Admit: 2013-09-26 | Discharge: 2013-09-26 | Disposition: A | Discharge status: Home / Self Care | Payer: Medicaid Other | Source: Ambulatory Visit | Attending: Internal Medicine | Admitting: Internal Medicine

## 2013-09-26 ENCOUNTER — Ambulatory Visit (INDEPENDENT_AMBULATORY_CARE_PROVIDER_SITE_OTHER): Payer: Medicaid Other | Admitting: Internal Medicine

## 2013-09-26 VITALS — BP 102/64 | HR 81 | Temp 98.7°F | Ht 66.0 in | Wt 256.0 lb

## 2013-09-26 DIAGNOSIS — R05 Cough: Secondary | ICD-10-CM

## 2013-09-26 DIAGNOSIS — R053 Chronic cough: Secondary | ICD-10-CM

## 2013-09-26 DIAGNOSIS — C8589 Other specified types of non-Hodgkin lymphoma, extranodal and solid organ sites: Secondary | ICD-10-CM | POA: Insufficient documentation

## 2013-09-26 DIAGNOSIS — R059 Cough, unspecified: Secondary | ICD-10-CM

## 2013-09-26 MED ORDER — PREDNISONE 10 MG PO TABS: ORAL_TABLET | ORAL | Status: DC

## 2013-09-26 NOTE — Patient Instructions (Addendum)
Prilosec 40 mg Take 30- 60 min before your first and last meals of the day   At bedtime take chlortrimeton 4 mg take 2 at bedtime  For cough take percocet up to 4 hours to stop coughing completely and throat clearing x 3 days  GERD (REFLUX)  is an extremely common cause of respiratory symptoms, many times with no significant heartburn at all.    It can be treated with medication, but also with lifestyle changes including avoidance of late meals, excessive alcohol, smoking cessation, and avoid fatty foods, chocolate, peppermint, colas, red wine, and acidic juices such as orange juice.  NO MINT OR MENTHOL PRODUCTS SO NO COUGH DROPS  USE SUGARLESS CANDY INSTEAD (jolley ranchers or Stover's or life savers) NO OIL BASED VITAMINS - use powdered substitutes.  Prednisone 10 mg take  4 each am x 2 days,   2 each am x 2 days,  1 each am x 2 days and stop   Please remember to go to the  x-ray department  At [facility] dx is cough  - we will call you with the results when they are available.    Please schedule a follow up office visit in 4 weeks, sooner if needed

## 2013-09-26 NOTE — Assessment & Plan Note (Addendum)
The most common causes of chronic cough in immunocompetent adults include the following: upper airway cough syndrome (UACS), previously referred to as postnasal drip syndrome (PNDS), which is caused by variety of rhinosinus conditions; (2) asthma; (3) GERD; (4) chronic bronchitis from cigarette smoking or other inhaled environmental irritants; (5) nonasthmatic eosinophilic bronchitis; and (6) bronchiectasis.   These conditions, singly or in combination, have accounted for up to 94% of the causes of chronic cough in prospective studies.   Other conditions have constituted no >6% of the causes in prospective studies These have included bronchogenic carcinoma, chronic interstitial pneumonia, sarcoidosis, left ventricular failure, ACEI-induced cough, and aspiration from a condition associated with pharyngeal dysfunction.    Chronic cough is often simultaneously caused by more than one condition. A single cause has been found from 38 to 82% of the time, multiple causes from 18 to 62%. Multiply caused cough has been the result of three diseases up to 42% of the time.       Based on hx and exam, this is most likely:  Classic Upper airway cough syndrome, so named because it's frequently impossible to sort out how much is  CR/sinusitis with freq throat clearing (which can be related to primary GERD)   vs  causing  secondary (" extra esophageal")  GERD from wide swings in gastric pressure that occur with throat clearing, often  promoting self use of mint and menthol lozenges that reduce the lower esophageal sphincter tone and exacerbate the problem further in a cyclical fashion.   These are the same pts (now being labeled as having "irritable larynx syndrome" by some cough centers) who not infrequently have a history of having failed to tolerate ace inhibitors,  dry powder inhalers or biphosphonates or report having atypical reflux symptoms that don't respond to standard doses of PPI , and are easily confused as  having aecopd or asthma flares by even experienced allergists/ pulmonologists.   The first step is to maximize acid suppression and eliminate cyclical coughing/pnds with 1st gen h1  then regroup    See instructions for specific recommendations which were reviewed directly with the patient who was given a copy with highlighter outlining the key components.

## 2013-09-26 NOTE — Progress Notes (Addendum)
Subjective:    Patient ID: [name], female    DOB: 24-Dec-1965 MRN: [id number]  HPI  68 yowf never smoker HIV 1992 with tendency to cough with strong smells/fumes etc but then around early 2000's developed more of a chronic mostly dry daytime pm cough  (prev dx's as "chronic bronchtis" per per>   then NHL rx with chemo / RT by [facility] then Nov 2014 developed fever / green mucus/ throat swelling and multiple abx > back to clear but breathing did not recover and cough worse than usual so referred by Wendie Agreste to pulmonary clinic.   09/26/2013 1st [facility] office visit/ [name]  Chief Complaint  Patient presents with  . Pulmonary Consult    Referred per Dr. Tommy Medal. Pt reports having bronchitis "for 10 yrs". She c/o increased SOB since Nov 2014. She states that she is SOB "all the time".   was using combivent or albuterol every few days now using every 6h with no relief in cough or sob at rest which is mostly related to coughing fits worse as day goes on, completely dry now Already on ppi but not timed to meals, using lots of throat lozenges which don't help the constant sense of pnds esp at hs but note this doesn't typically disturb her once asleep.  No obvious other patterns in day to day or daytime variabilty or assoc   cp or chest tightness, subjective wheeze overt   hb symptoms. No unusual exp hx or h/o childhood pna/ asthma or knowledge of premature birth.  Sleeping ok without nocturnal  or early am exacerbation  of respiratory  c/o's or need for noct saba. Also denies any obvious fluctuation of symptoms with weather or environmental changes or other aggravating or alleviating factors except as outlined above   Current Medications, Allergies, Complete Past Medical History, Past Surgical History, Family History, and Social History were reviewed in [employer] record.           Review of Systems  Constitutional: Negative for fever, chills and unexpected  weight change.  HENT: Positive for congestion, ear pain, sneezing and sore throat. Negative for dental problem, nosebleeds, postnasal drip, rhinorrhea, sinus pressure, trouble swallowing and voice change.   Eyes: Negative for visual disturbance.  Respiratory: Positive for cough and shortness of breath. Negative for choking.   Cardiovascular: Positive for chest pain. Negative for leg swelling.  Gastrointestinal: Negative for vomiting, abdominal pain and diarrhea.  Genitourinary: Negative for difficulty urinating.  Musculoskeletal: Negative for arthralgias.  Skin: Negative for rash.  Neurological: Positive for headaches. Negative for tremors and syncope.  Hematological: Does not bruise/bleed easily.       Objective:   Physical Exam   amb obese wf nad with very harsh barking/ choking quality upper airway dry  cough   Wt Readings from Last 3 Encounters:  09/26/13 256 lb (116.121 kg)  09/23/13 255 lb (115.667 kg)  08/15/13 246 lb (111.585 kg)     HEENT: nl dentition, turbinates, and orophanx. Nl external ear canals without cough reflex   NECK :  without JVD/Nodes/TM/ nl carotid upstrokes bilaterally   LUNGS: no acc muscle use, clear to A and P bilaterally without cough on insp or exp maneuvers   CV:  RRR  no s3 or murmur or increase in P2, no edema   ABD:  soft and nontender with nl excursion in the supine position. No bruits or organomegaly, bowel sounds nl  MS:  warm without deformities,  calf tenderness, cyanosis or clubbing  SKIN: warm and dry without lesions    NEURO:  alert, approp, no deficits   CXR  09/26/2013 : No active cardiopulmonary disease.            Assessment & Plan:

## 2013-09-27 NOTE — Progress Notes (Signed)
Quick Note:  Spoke with pt and notified of results per Dr. Wert. Pt verbalized understanding and denied any questions.  ______ 

## 2013-10-11 ENCOUNTER — Other Ambulatory Visit: Payer: Self-pay | Admitting: *Deleted

## 2013-10-11 DIAGNOSIS — R05 Cough: Secondary | ICD-10-CM

## 2013-10-11 DIAGNOSIS — R059 Cough, unspecified: Secondary | ICD-10-CM

## 2013-10-11 MED ORDER — IPRATROPIUM-ALBUTEROL 18-103 MCG/ACT IN AERO: 2.0000 | INHALATION_SPRAY | Freq: Four times a day (QID) | RESPIRATORY_TRACT | Status: DC | PRN

## 2013-10-16 ENCOUNTER — Other Ambulatory Visit: Payer: Self-pay | Admitting: *Deleted

## 2013-10-17 MED ORDER — LORAZEPAM 0.5 MG PO TABS: 0.5000 mg | ORAL_TABLET | Freq: Every day | ORAL | Status: DC | PRN

## 2013-10-17 NOTE — Telephone Encounter (Signed)
Called to pharm 

## 2013-10-18 ENCOUNTER — Ambulatory Visit: Payer: Medicaid Other | Admitting: Physical Medicine & Rehabilitation

## 2013-10-24 ENCOUNTER — Ambulatory Visit (INDEPENDENT_AMBULATORY_CARE_PROVIDER_SITE_OTHER): Payer: Medicaid Other | Admitting: Internal Medicine

## 2013-10-24 ENCOUNTER — Encounter: Payer: Self-pay | Admitting: Internal Medicine

## 2013-10-24 VITALS — BP 132/70 | HR 84 | Ht 66.0 in | Wt 259.0 lb

## 2013-10-24 DIAGNOSIS — R05 Cough: Secondary | ICD-10-CM

## 2013-10-24 DIAGNOSIS — R053 Chronic cough: Secondary | ICD-10-CM

## 2013-10-24 DIAGNOSIS — R059 Cough, unspecified: Secondary | ICD-10-CM

## 2013-10-24 MED ORDER — MOMETASONE FURO-FORMOTEROL FUM 100-5 MCG/ACT IN AERO: INHALATION_SPRAY | RESPIRATORY_TRACT | Status: DC

## 2013-10-24 NOTE — Patient Instructions (Addendum)
Dulera 100 Take 2 puffs first thing in am and then another 2 puffs about 12 hours later- work on smooth deep breathing   Only use your albuterol(red= proair)  as a rescue medication to be used if you can't catch your breath by resting or doing a relaxed purse lip breathing pattern.  - The less you use it, the better it will work when you need it. - Ok to use up to 2 puffs  every 4 hours if you must but call for immediate appointment if use goes up over your usual need - Don't leave home without it !!  (think of it like the spare tire for your car)   Call your GI doctor for follow up of your heartburn and continue the diet  In meantime  Please schedule a follow up office visit in 6 weeks, call sooner if needed

## 2013-10-24 NOTE — Progress Notes (Signed)
Subjective:    Patient ID: [name], female    DOB: 03-24-1966 MRN: [id number]    Brief patient profile:  6 yowf never smoker HIV 1992 with tendency to cough with strong smells/fumes etc but then around early 2000's developed more of a chronic mostly dry daytime pm cough  (prev dx's as "chronic bronchtis" per per>   then NHL rx with chemo / RT by [facility] then Nov 2014 developed fever / green mucus/ throat swelling and multiple abx > back to clear but breathing did not recover and cough worse than usual so referred by Wendie Agreste to pulmonary clinic.   History of Present Illness  09/26/2013 1st [facility] office visit/ Wert  Chief Complaint  Patient presents with  . Pulmonary Consult    Referred per Dr. Tommy Medal. Pt reports having bronchitis "for 10 yrs". She c/o increased SOB since Nov 2014. She states that she is SOB "all the time".   was using combivent or albuterol every few days now using every 6h with no relief in cough or sob at rest which is mostly related to coughing fits worse as day goes on, completely dry now Already on ppi but not timed to meals, using lots of throat lozenges which don't help the constant sense of pnds esp at hs but note this doesn't typically disturb her once asleep. rec Prilosec 40 mg Take 30- 60 min before your first and last meals of the day  At bedtime take chlortrimeton 4 mg take 2 at bedtime For cough take percocet up to 4 hours to stop coughing completely and throat clearing x 3 days GERD (REFLUX)  is an extremely common cause of respiratory symptoms, many times with no significant heartburn at all.  Prednisone 10 mg take  4 each am x 2 days,   2 each am x 2 days,  1 each am x 2 days and stop    10/24/2013 f/u ov/Wert re: cough since Nov 2014  Chief Complaint  Patient presents with  . Follow-up    Pt states her cough is improved but still present.  Also c/o SOB with exertion, heartburn.     much better on prednisone, much less need for  inhalers then off the pred grad onset  chest less tight, some worse cough off it and also cc overt hb desptie ppi and pepcid.  No obvious day to day or daytime variabilty or assoc cp  , subjective wheeze or overt sinus   symptoms. No unusual exp hx or h/o childhood pna/ asthma or knowledge of premature birth.  Sleeping ok without nocturnal  or early am exacerbation  of respiratory  c/o's or need for noct saba. Also denies any obvious fluctuation of symptoms with weather or environmental changes or other aggravating or alleviating factors except as outlined above   Current Medications, Allergies, Complete Past Medical History, Past Surgical History, Family History, and Social History were reviewed in [employer] record.  ROS  The following are not active complaints unless bolded sore throat, dysphagia, dental problems, itching, sneezing,  nasal congestion or excess/ purulent secretions, ear ache,   fever, chills, sweats, unintended wt loss, pleuritic or exertional cp, hemoptysis,  orthopnea pnd or leg swelling, presyncope, palpitations, heartburn, abdominal pain, anorexia, nausea, vomiting, diarrhea  or change in bowel or urinary habits, change in stools or urine, dysuria,hematuria,  rash, arthralgias, visual complaints, headache, numbness weakness or ataxia or problems with walking or coordination,  change in mood/affect or  memory.              Objective:   Physical Exam   amb obese wf nad    10/24/2013       259  Wt Readings from Last 3 Encounters:  09/26/13 256 lb (116.121 kg)  09/23/13 255 lb (115.667 kg)  08/15/13 246 lb (111.585 kg)     HEENT: nl dentition, turbinates, and orophanx. Nl external ear canals without cough reflex   NECK :  without JVD/Nodes/TM/ nl carotid upstrokes bilaterally   LUNGS: no acc muscle use, clear to A and P bilaterally without cough on insp or exp maneuvers   CV:  RRR  no s3 or murmur or increase in P2, no edema   ABD:  soft  and nontender with nl excursion in the supine position. No bruits or organomegaly, bowel sounds nl  MS:  warm without deformities, calf tenderness, cyanosis or clubbing  SKIN: warm and dry without lesions    NEURO:  alert, approp, no deficits   CXR  09/26/2013 : No active cardiopulmonary disease.            Assessment & Plan:

## 2013-10-25 NOTE — Assessment & Plan Note (Signed)
Apparent steroid response suggests cough variant asthma (? Albeit related to underlying poorly controlled gerd) or eos bronchitis/ rhinitis  rec trial of dulera 100 2bid   The proper method of use, as well as anticipated side effects, of a metered-dose inhaler are discussed and demonstrated to the patient. Improved effectiveness after extensive coaching during this visit to a level of approximately  75%

## 2013-10-30 ENCOUNTER — Ambulatory Visit (INDEPENDENT_AMBULATORY_CARE_PROVIDER_SITE_OTHER): Payer: Medicaid Other | Admitting: Neurology

## 2013-10-30 ENCOUNTER — Encounter: Payer: Self-pay | Admitting: Neurology

## 2013-10-30 VITALS — BP 122/87 | HR 65 | Ht 66.0 in | Wt 259.0 lb

## 2013-10-30 DIAGNOSIS — G43719 Chronic migraine without aura, intractable, without status migrainosus: Secondary | ICD-10-CM

## 2013-10-30 DIAGNOSIS — G43909 Migraine, unspecified, not intractable, without status migrainosus: Secondary | ICD-10-CM

## 2013-10-30 DIAGNOSIS — F329 Major depressive disorder, single episode, unspecified: Secondary | ICD-10-CM

## 2013-10-30 DIAGNOSIS — F3289 Other specified depressive episodes: Secondary | ICD-10-CM

## 2013-10-30 NOTE — Progress Notes (Signed)
HPI:   [name] is a 48 year old Caucasian female with past medical history of HIV, high cholesterol, migraines, depression, and non-Hodgkin lymphoma in 2005 with chemotherapy and radiation.   She has history of migraines since 19. In the last 10 years, she reports having a migraines daily. She is currently taking Topamax and Zomig with relief. She takes approximately 20-25 tablets of her Zomig per month.    She has aura's that look like floating dirt particles, flashing lights, and she sees Christmas tree lights when they are severe, she has nausea, vomiting and sometimes diarrhea when they're severe. She also complains of diplopia, blurry vision, and  photophobia. She also has had a lightning bolt sensation throughout her body.  She describes them as pulsating and throbbing  on a scale of 5-6/10 which she considers mild, when they are severe they can go as high as 10/10. They usually start in the right temporal area, but will also start on the left temporal and are worse.   Any type of smoke or MSG can precipitate a migraine. They generally last 2-9 days. She has been to the emergency department twice in the last year for migraines. She states "I just can't do this anymore I need to do something about her migraines"  She has tried in the past Maxalt, Imitrex, atenolol, verapamil, nortriptyline, Inderal, Depakote without benefit. Atenolol and verapamil caused hypotension. Depakote caused severe vomiting. Topamax causing her worsening headaches, I also put her on Zonegran, she denies significant improvement.  UPDATE April 29th 2015:  Last injection was in Jan 2015, she responded very well, there was no significant side effect noticed. She noticed increased headache over few weeks,  couple times a week, Zomig was helpful,  Physical Exam  General: Well groomed and obese female Neck: supple no carotid bruits Respiratory: clear to auscultation bilaterally Cardiovascular: regular rate rhythm She wear  right knee brace  Neurologic Exam  Mental Status: obese, pleasant, awake, alert, cooperative to history, talking, and casual conversation. Cranial Nerves: CN II-XII pupils were equal round reactive to light. Extraocular movements were full.  Visual fields were full on confrontational test.  Facial sensation and strength were normal.   Uvula tongue were midline.  Head turning and shoulder shrugging were normal and symmetric.  Tongue protrusion into the cheeks strength were normal.  Motor: Normal tone, bulk, and strength. Sensory: Normal to light touch, pinprick, proprioception except decreased vibratory sensation to her right lower extremity she relates to peripheral neuropathy.  Decreased vibratory sensation to left upper extremity.   Coordination: There was no dysmetria noticed. Gait and Station: Narrow based and steady,  Reflexes: Deep tendon reflexes: present and symmetric.  Plantar responses are flexor.   Assessment and plan he has an 48 year-old Caucasian female with a history of HIV, hypercholesterolemia, Non hodgkin' lymphoma with chemotherapy and radiation and migraines since age 17.   She has tried and failed multiple preventive medications including atenolol, verapamil Maxalt, Imitrex, nortriptyline, and Depakote,Topamax,   She has migraines daily for the last 10 years. Currently taking  zonogram  and Zomig with some relief.   Normal MRI brain, and neurological exam.   BOTOX injection was performed according to protocol by [facility].  was exception of skipping injection to frontalis this time, because of decreased wrinkle frontalis muscle movement  100 units of BOTOX /2 cc NS.   Total of  200 units,    Temporalis 8 sites,  40 units  Occipitalis 6 sites, 30 units Cervical Paraspinal, 12 sites,  60 units Trapezius, 8 sites, 40 units Cervical paraspinals 30 units  Patient tolerate the injection well. Will return for repeat injection in 3 months.

## 2013-11-05 ENCOUNTER — Ambulatory Visit (INDEPENDENT_AMBULATORY_CARE_PROVIDER_SITE_OTHER): Payer: Medicaid Other | Admitting: Internal Medicine

## 2013-11-05 ENCOUNTER — Encounter: Payer: Self-pay | Admitting: Internal Medicine

## 2013-11-05 VITALS — BP 120/86 | HR 97 | Temp 98.4°F | Ht 65.0 in | Wt 258.1 lb

## 2013-11-05 DIAGNOSIS — F3289 Other specified depressive episodes: Secondary | ICD-10-CM

## 2013-11-05 DIAGNOSIS — F329 Major depressive disorder, single episode, unspecified: Secondary | ICD-10-CM

## 2013-11-05 DIAGNOSIS — E785 Hyperlipidemia, unspecified: Secondary | ICD-10-CM

## 2013-11-05 DIAGNOSIS — G894 Chronic pain syndrome: Secondary | ICD-10-CM

## 2013-11-05 LAB — LIPID PANEL
Cholesterol: 269 mg/dL — ABNORMAL HIGH (ref 0–200)
HDL: 38 mg/dL — AB (ref >39)
Total CHOL/HDL Ratio: 7.1 Ratio
Triglycerides: 456 mg/dL — ABNORMAL HIGH (ref <150)

## 2013-11-05 MED ORDER — OXYCODONE-ACETAMINOPHEN 5-325 MG PO TABS: 1.0000 | ORAL_TABLET | Freq: Four times a day (QID) | ORAL | Status: DC | PRN

## 2013-11-05 MED ORDER — FLUOXETINE HCL 20 MG PO CAPS: 20.0000 mg | ORAL_CAPSULE | Freq: Every day | ORAL | Status: DC

## 2013-11-05 NOTE — Patient Instructions (Addendum)
Please stop zoloft and start prozac. We will start with 20mg  of zoloft daily and may titrate up to 40mg  daily after at least 2 weeks, please call the office if no improvement and we can adjust the dose.   We have reviewed the possibility of serotonin syndrome with the addition of prozac and discontinuation of zoloft in mix with your migraine medicine, zomig. You wish to keep using both. Please let us know if you have confusion, seizures, fever, chills, or any of the other symptoms listed below as we discussed. Stop the medications immediately and call us right away or go to ED if severe.   Fluoxetine capsules or tablets (Depression/Mood Disorders) What is this medicine? FLUOXETINE (floo OX e teen) belongs to a class of drugs known as selective serotonin reuptake inhibitors (SSRIs). It helps to treat mood problems such as depression, obsessive compulsive disorder, and panic attacks. It can also treat certain eating disorders. This medicine may be used for other purposes; ask your health care provider or pharmacist if you have questions. COMMON BRAND NAME(S): Prozac What should I tell my health care provider before I take this medicine? They need to know if you have any of these conditions: -bipolar disorder or mania -diabetes -glaucoma -liver disease -psychosis -seizures -suicidal thoughts or history of attempted suicide -an unusual or allergic reaction to fluoxetine, other medicines, foods, dyes, or preservatives -pregnant or trying to get pregnant -breast-feeding How should I use this medicine? Take this medicine by mouth with a glass of water. Follow the directions on the prescription label. You can take this medicine with or without food. Take your medicine at regular intervals. Do not take it more often than directed. Do not stop taking this medicine suddenly except upon the advice of your doctor. Stopping this medicine too quickly may cause serious side effects or your condition may  worsen. A special MedGuide will be given to you by the pharmacist with each prescription and refill. Be sure to read this information carefully each time. Talk to your pediatrician regarding the use of this medicine in children. While this drug may be prescribed for children as young as 7 years for selected conditions, precautions do apply. Overdosage: If you think you have taken too much of this medicine contact a poison control center or emergency room at once. NOTE: This medicine is only for you. Do not share this medicine with others. What if I miss a dose? If you miss a dose, skip the missed dose and go back to your regular dosing schedule. Do not take double or extra doses. What may interact with this medicine? Do not take fluoxetine with any of the following medications: -other medicines containing fluoxetine, like Sarafem or Symbyax -cisapride -linezolid -MAOIs like Carbex, Eldepryl, Marplan, Nardil, and Parnate -methylene blue (injected into a vein) -pimozide -thioridazine This medicine may also interact with the following medications: -alcohol -aspirin and aspirin-like medicines -carbamazepine -certain medicines for depression, anxiety, or psychotic disturbances -certain medicines for migraine headaches like almotriptan, eletriptan, frovatriptan, naratriptan, rizatriptan, sumatriptan, zolmitriptan -digoxin -diuretics -fentanyl -flecainide -furazolidone -isoniazid -lithium -medicines for sleep -medicines that treat or prevent blood clots like warfarin, enoxaparin, and dalteparin -NSAIDs, medicines for pain and inflammation, like ibuprofen or naproxen -phenytoin -procarbazine -propafenone -rasagiline -ritonavir -supplements like St. John's wort, kava kava, valerian -tramadol -tryptophan -vinblastine This list may not describe all possible interactions. Give your health care provider a list of all the medicines, herbs, non-prescription drugs, or dietary supplements you  use. Also tell them if  you smoke, drink alcohol, or use illegal drugs. Some items may interact with your medicine. What should I watch for while using this medicine? Tell your doctor if your symptoms do not get better or if they get worse. Visit your doctor or health care professional for regular checks on your progress. Because it may take several weeks to see the full effects of this medicine, it is important to continue your treatment as prescribed by your doctor. Patients and their families should watch out for new or worsening thoughts of suicide or depression. Also watch out for sudden changes in feelings such as feeling anxious, agitated, panicky, irritable, hostile, aggressive, impulsive, severely restless, overly excited and hyperactive, or not being able to sleep. If this happens, especially at the beginning of treatment or after a change in dose, call your health care professional. Dennis Bast may get drowsy or dizzy. Do not drive, use machinery, or do anything that needs mental alertness until you know how this medicine affects you. Do not stand or sit up quickly, especially if you are an older patient. This reduces the risk of dizzy or fainting spells. Alcohol may interfere with the effect of this medicine. Avoid alcoholic drinks. Your mouth may get dry. Chewing sugarless gum or sucking hard candy, and drinking plenty of water may help. Contact your doctor if the problem does not go away or is severe. This medicine may affect blood sugar levels. If you have diabetes, check with your doctor or health care professional before you change your diet or the dose of your diabetic medicine. What side effects may I notice from receiving this medicine? Side effects that you should report to your doctor or health care professional as soon as possible: -allergic reactions like skin rash, itching or hives, swelling of the face, lips, or tongue -breathing problems -confusion -fast or irregular heart rate,  palpitations -flu-like fever, chills, cough, muscle or joint aches and pains -seizures -suicidal thoughts or other mood changes -tremors -trouble sleeping -unusual bleeding or bruising -unusually tired or weak -vomiting Side effects that usually do not require medical attention (report to your doctor or health care professional if they continue or are bothersome): -blurred vision -change in sex drive or performance -diarrhea -dry mouth -flushing -headache -increased or decreased appetite -nausea -sweating This list may not describe all possible side effects. Call your doctor for medical advice about side effects. You may report side effects to FDA at 1-800-FDA-1088. Where should I keep my medicine? Keep out of the reach of children. Store at room temperature between 15 and 30 degrees C (59 and 86 degrees F). Throw away any unused medicine after the expiration date. NOTE: This sheet is a summary. It may not cover all possible information. If you have questions about this medicine, talk to your doctor, pharmacist, or health care provider.  2014, Elsevier/Gold Standard. (2013-01-11 12:48:36)  Serotonin Syndrome Serotonin is a brain chemical that regulates the nervous system. Some kinds of drugs increase the amount of serotonin in your body. Drugs that increase the serotonin in your body include:   Anti-depressant medications.  St. John's wort.  Recreational drugs.  Migraine medicines.  Some pain medicines. SYMPTOMS Combining these drugs increases the risk that you will become ill with a toxic condition called serotonin syndrome.  Symptoms of too much serotonin include:  Confusion.  Agitation.  Weakness.  Insomnia.  Fever.  Sweats. Other symptoms that may develop include:  Shakiness.  Muscle spasms.  Seizures. TREATMENT  Hospital treatment is often needed  until the effects are controlled.  Avoiding the combination of medicines listed above is  recommended.  Check with your doctor if you are concerned about your medicine or the side effects. Document Released: 07/28/2004 Document Revised: 09/12/2011 Document Reviewed: 06/20/2005 [facility] Patient Information 2014 [place].

## 2013-11-05 NOTE — Progress Notes (Signed)
Subjective:   Patient ID: [name] female   DOB: [birth year] 48 y.o.   MRN: [id number]  HPI: [name] is a 48 y.o. white female with PMH of HIV(well controlled), NHL, and HTN presenting to [facility] today for routine follow up visit.  She reports her depression to still not be controlled with her main complaint being that she does not wish to interact with people as much lately. She says she is normally a very social and talkative person but lately she is okay being on her own and does not wish to interact much. She denies suicidal or homicidal ideation.  She reports no improvement with increased dose of zoloft, and as a result has brought herself down on the dose since she wishes to switch to prozac. We had discussed prozac on prior visit but at that time she did not wish to try prozac and preferred zoloft. Of note, she was referred to psychiatry in the past but she says when she called they said they did not have a referral. I will have our staff look into this and refer again if needed.   She continues to be frustrated with weight gain but has not been exercising much.  However, she does report recently acquiring gym equipment for her home including a treadmill and bicycle that she is hopeful to use. She is willing to see our health coach.   Past Medical History  Diagnosis Date  . Anemia 10/2009    macrocytic anemia with baseline MCV 104-106  . PUD (peptic ulcer disease) 07/1997    per EGD report 07/1997 with history of esophagitis  . Migraines     on topamax and triptans, frequent (almost daily) attacks   . Hyperlipidemia   . HIV infection     CD4 = 570 (05/2010), VL undetectation  . Anxiety   . Spondylolisthesis of lumbar region     L5-S1  . Gastroparesis   . Shingles     in lumbar dermatome  . Hypertension     no meds  . Non Hodgkin's lymphoma     stage II, s/p resection, chemotherapy, radiotherapy, Dr. Beryle Beams is her oncologist.   . Asthma   . DVT (deep venous  thrombosis)     history clot lt groin when she had lymphoma-in remision now.  . Traumatic tear of lateral meniscus of right knee 04/20/2012  . 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 Hiatal hernia 01/01/2002  . Acute gastritis without mention of hemorrhage 01/01/2002  . Duodenitis without mention of hemorrhage 01/01/2002  . GERD (gastroesophageal reflux disease)   . IBS (irritable bowel syndrome)    Current Outpatient Prescriptions  Medication Sig Dispense Refill  . albuterol-ipratropium (COMBIVENT) 18-103 MCG/ACT inhaler Inhale 2 puffs into the lungs every 6 (six) hours as needed for wheezing.  4 Inhaler  3  . Calcium Carbonate-Vitamin D (CALCIUM-VITAMIN D) 500-200 MG-UNIT per tablet Take 2 tablets by mouth daily.       . Darunavir-Cobicistat (PREZCOBIX) 800-150 MG TABS Take 1 tablet by mouth daily.  30 tablet  11  . Diclofenac Potassium 50 MG PACK Take 50 mg by mouth as needed.  15 each  12  . emtricitabine-tenofovir (TRUVADA) 200-300 MG per tablet Take 1 tablet by mouth daily.  30 tablet  11  . fluconazole (DIFLUCAN) 100 MG tablet Take 1 tablet (100 mg total) by mouth once.  10 tablet  2  . LORazepam (ATIVAN) 0.5 MG tablet Take 1 tablet (0.5 mg total)  by mouth daily as needed for anxiety.  30 tablet  0  . mometasone-formoterol (DULERA) 100-5 MCG/ACT AERO Take 2 puffs first thing in am and then another 2 puffs about 12 hours later.  1 Inhaler  11  . omeprazole (PRILOSEC) 40 MG capsule Take 40 mg by mouth 2 (two) times daily.      Marland Kitchen oxyCODONE-acetaminophen (PERCOCET/ROXICET) 5-325 MG per tablet Take 1 tablet by mouth every 6 (six) hours as needed.  120 tablet  0  . PROAIR HFA 108 (90 BASE) MCG/ACT inhaler TWO PUFFS EVERY SIX HOURS AS NEEDED FOR WHEEZING  8.5 g  0  . promethazine (PHENERGAN) 50 MG tablet Take 0.5-1 tablets (25-50 mg total) by mouth every 6 (six) hours as needed for nausea. Up to 4 times a day as needed for nausea  90 tablet  11  . scopolamine (TRANSDERM-SCOP) 1.5 MG Place 1  patch (1.5 mg total) onto the skin every other day as needed.  10 patch  3  . sertraline (ZOLOFT) 100 MG tablet Take 1 tablet (100 mg total) by mouth daily. Titrate up to 72m daily after one week if well tolerated  30 tablet  5  . Tamsulosin HCl (FLOMAX) 0.4 MG CAPS Take 0.4 mg by mouth daily. 1-2 daily      . terconazole (TERAZOL 3) 0.8 % vaginal cream Place 1 applicator vaginally at bedtime.  20 g  5  . VOLTAREN 1 % GEL APPLY TOPICALLY TWICE A DAY AS NEEDED FOR PAIN.  100 g  3  . zolmitriptan (ZOMIG) 5 MG tablet Take 1 tablet (5 mg total) by mouth as needed for migraine.  15 tablet  12   No current facility-administered medications for this visit.   Family History  Problem Relation Age of Onset  . Hypertension Mother   . Stroke Mother   . Breast cancer Paternal Grandmother   . Kidney cancer Maternal Uncle   . Diabetes Maternal Uncle   . Diabetes Maternal Grandmother   . Emphysema Paternal Uncle     never smoker  . Emphysema Paternal Grandfather     never smoker  . Colon cancer Paternal Aunt     with met to lung   . Non-Hodgkin's lymphoma Maternal Uncle    History   Social History  . Marital Status: Divorced    Spouse Name: N/A    Number of Children: 0  . Years of Education: 12   Occupational History  . disabled    Social History Main Topics  . Smoking status: Never Smoker   . Smokeless tobacco: Never Used  . Alcohol Use: 0.5 oz/week    1 drink(s) per week     Comment: on her birthday   . Drug Use: No  . Sexual Activity: None   Other Topics Concern  . None   Social History Narrative      Patient is disabled.   Right handed.   Caffeine - Diet soda., tea   Education - high school   Review of Systems:  Constitutional:  Denies fever, chills   HEENT:  Denies sore throat, rhinorrhea  Respiratory:  SOB and cough intermittent  Cardiovascular:  Denies leg swelling.   Gastrointestinal:  Denies nausea, vomiting  Genitourinary:  Denies dysuria  Musculoskeletal:   Denies gait problem.   Skin:  Denies pallor, rash and wound.   Neurological/Psych:  Depression and anxiety, migraines.   Objective:  Physical Exam: Filed Vitals:   11/05/13 1320  Pulse: 97  Temp: 98.4  F (36.9 C)  TempSrc: Oral  Height: 5' 5"  (1.651 m)  Weight: 258 lb 1.6 oz (117.073 kg)  SpO2: 95%   Vitals reviewed. General: sitting in chair, NAD HEENT: EOMI Cardiac: RRR Pulm: clear to auscultation bilaterally, no wheezes, rales, or rhonchi Abd: soft, obese, tenderness to palpation left mid abdomen to point tenderness at site of prior surgical scar, BS present Ext: warm and well perfused, no pedal edema Neuro: alert and oriented X3, cranial nerves II-XII grossly intact, strength and sensation to light touch equal in bilateral upper and lower extremities  Assessment & Plan:  Discussed with Dr. Ellwood Dense Change to prozac, start at 15m qd and increase to 467mGiven 3 months of percocet refills Health coach referral

## 2013-11-05 NOTE — Assessment & Plan Note (Signed)
Recent weight gain  -check lipid panel

## 2013-11-05 NOTE — Assessment & Plan Note (Signed)
Has reduced her dose of zoloft gradually from 100 to now 25mg  daily. Wishes to transition to prozac  -d/c zoloft -start prozac, 20mg  daily initially and titrate up as needed -cautioned again on possible interactions with triptans including serotonin syndrome. She understands the risks and wishes to proceed with both medications.

## 2013-11-05 NOTE — Assessment & Plan Note (Signed)
Refilled percocet until August

## 2013-11-06 ENCOUNTER — Telehealth: Payer: Self-pay | Admitting: Dietician

## 2013-11-06 ENCOUNTER — Telehealth: Payer: Self-pay | Admitting: Licensed Clinical Social Worker

## 2013-11-06 LAB — LDL CHOLESTEROL, DIRECT: LDL DIRECT: 131 mg/dL — AB

## 2013-11-06 NOTE — Telephone Encounter (Signed)
CSW placed called to pt.  CSW left message requesting return call. CSW provided contact hours and phone number. 

## 2013-11-06 NOTE — Telephone Encounter (Signed)
Left message requesting return call to schedule appointment for health coaching/MNt for weight problems.

## 2013-11-06 NOTE — Addendum Note (Signed)
Addended by: Wilber Oliphant on: 11/06/2013 09:47 AM   Modules accepted: Orders

## 2013-11-07 NOTE — Progress Notes (Signed)
Case discussed with Dr. Qureshi at the time of the visit.  We reviewed the resident's history and exam and pertinent patient test results.  I agree with the assessment, diagnosis, and plan of care documented in the resident's note. 

## 2013-11-08 NOTE — Telephone Encounter (Signed)
CSW placed called to pt.  CSW left message requesting return call. CSW provided contact hours and phone number. 

## 2013-11-11 NOTE — Telephone Encounter (Signed)
Pt answered and then line was disconnected.  CSW called back and received voicemail.  CSW left message requesting return call. CSW provided contact hours and phone number.  CSW has not received return call from pt.  CSW will send letter and sign off at this time.

## 2013-11-12 ENCOUNTER — Encounter: Payer: Self-pay | Admitting: Physical Medicine & Rehabilitation

## 2013-11-12 ENCOUNTER — Ambulatory Visit (HOSPITAL_BASED_OUTPATIENT_CLINIC_OR_DEPARTMENT_OTHER): Payer: Medicaid Other | Admitting: Physical Medicine & Rehabilitation

## 2013-11-12 ENCOUNTER — Encounter: Payer: No Typology Code available for payment source | Attending: Physical Medicine & Rehabilitation

## 2013-11-12 VITALS — BP 127/69 | HR 86 | Resp 14 | Ht 65.0 in | Wt 239.6 lb

## 2013-11-12 DIAGNOSIS — M545 Low back pain, unspecified: Secondary | ICD-10-CM | POA: Diagnosis not present

## 2013-11-12 DIAGNOSIS — M542 Cervicalgia: Secondary | ICD-10-CM | POA: Diagnosis present

## 2013-11-12 DIAGNOSIS — S134XXA Sprain of ligaments of cervical spine, initial encounter: Secondary | ICD-10-CM

## 2013-11-12 DIAGNOSIS — S139XXA Sprain of joints and ligaments of unspecified parts of neck, initial encounter: Secondary | ICD-10-CM

## 2013-11-12 DIAGNOSIS — G8929 Other chronic pain: Secondary | ICD-10-CM | POA: Diagnosis not present

## 2013-11-12 NOTE — Progress Notes (Signed)
Subjective:    Patient ID: [name], female    DOB: March 05, 1966, 48 y.o.   MRN: [id number]  HPI  Pain Inventory Average Pain 6 Pain Right Now 9 My pain is sharp, burning, stabbing and aching  In the last 24 hours, has pain interfered with the following? General activity 0 Relation with others 0 Enjoyment of life 0 What TIME of day is your pain at its worst? all Sleep (in general) Poor  Pain is worse with: walking, bending, sitting, inactivity, standing, unsure and some activites Pain improves with: rest, heat/ice and medication Relief from Meds: 0  Mobility walk without assistance  Function Do you have any goals in this area?  no  Neuro/Psych bowel control problems weakness numbness tingling spasms depression anxiety  Prior Studies Any changes since last visit?  no  Physicians involved in your care Any changes since last visit?  no   Family History  Problem Relation Age of Onset  . Hypertension Mother   . Stroke Mother   . Breast cancer Paternal Grandmother   . Kidney cancer Maternal Uncle   . Diabetes Maternal Uncle   . Diabetes Maternal Grandmother   . Emphysema Paternal Uncle     never smoker  . Emphysema Paternal Grandfather     never smoker  . Colon cancer Paternal Aunt     with met to lung   . Non-Hodgkin's lymphoma Maternal Uncle    History   Social History  . Marital Status: Divorced    Spouse Name: N/A    Number of Children: 0  . Years of Education: 12   Occupational History  . disabled    Social History Main Topics  . Smoking status: Never Smoker   . Smokeless tobacco: Never Used  . Alcohol Use: 0.5 oz/week    1 drink(s) per week     Comment: on her birthday   . Drug Use: No  . Sexual Activity: None   Other Topics Concern  . None   Social History Narrative      Patient is disabled.   Right handed.   Caffeine - Diet soda., tea   Education - high school   Past Surgical History  Procedure Laterality Date  . Upper  gastrointestinal endoscopy  2003, 2007    done by Dr. Velora Heckler  . Tonsillectomy    . Endometrial ablation    . Tympanostomy tube placement      as child-both  . Tympanoplasty      left  . Cholecystectomy    . Knee arthroscopy  04/20/12    right   Past Medical History  Diagnosis Date  . Anemia 10/2009    macrocytic anemia with baseline MCV 104-106  . PUD (peptic ulcer disease) 07/1997    per EGD report 07/1997 with history of esophagitis  . Migraines     on topamax and triptans, frequent (almost daily) attacks   . Hyperlipidemia   . HIV infection     CD4 = 570 (05/2010), VL undetectation  . Anxiety   . Spondylolisthesis of lumbar region     L5-S1  . Gastroparesis   . Shingles     in lumbar dermatome  . Hypertension     no meds  . Non Hodgkin's lymphoma     stage II, s/p resection, chemotherapy, radiotherapy, Dr. Beryle Beams is her oncologist.   . Asthma   . DVT (deep venous thrombosis)     history clot lt groin when she had lymphoma-in  remision now.  . Traumatic tear of lateral meniscus of right knee 04/20/2012  . 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 Hiatal hernia 01/01/2002  . Acute gastritis without mention of hemorrhage 01/01/2002  . Duodenitis without mention of hemorrhage 01/01/2002  . GERD (gastroesophageal reflux disease)   . IBS (irritable bowel syndrome)    BP 127/69  Pulse 86  Resp 14  Ht _0  (1.651 m)  Wt 239 lb 9.6 oz (108.682 kg)  BMI 39.87 kg/m2  SpO2 98%  Opioid Risk Score:   Fall Risk Score: Low Fall Risk (0-5 points)  Review of Systems  All other systems reviewed and are negative.      Objective:   Physical Exam        Assessment & Plan:
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
## 2013-11-13 ENCOUNTER — Other Ambulatory Visit: Payer: Self-pay | Admitting: Neurology

## 2013-11-14 ENCOUNTER — Telehealth: Payer: Self-pay | Admitting: Neurology

## 2013-11-14 DIAGNOSIS — G43909 Migraine, unspecified, not intractable, without status migrainosus: Secondary | ICD-10-CM

## 2013-11-14 MED ORDER — SCOPOLAMINE 1 MG/3DAYS TD PT72
1.0000 | MEDICATED_PATCH | TRANSDERMAL | Status: DC | PRN
Start: 2013-11-14 — End: 2015-02-19

## 2013-11-14 NOTE — Telephone Encounter (Signed)
Patient calling because she needs a refill on scopolamine (TRANSDERM-SCOP) 1.5 MG states Dr. Krista Blue was suppose to fill it but when she called it in it was denied

## 2013-11-14 NOTE — Telephone Encounter (Signed)
Patient is requesting a Rx for Transderm Scope Patches.  It does not appear we have prescribed this before.  Would you like to fill?  Please advise.

## 2013-11-14 NOTE — Telephone Encounter (Signed)
[name], let her know, I have refilled her scopolamine patch

## 2013-11-14 NOTE — Telephone Encounter (Signed)
No response back at this time.

## 2013-11-15 NOTE — Telephone Encounter (Signed)
I called the patient back.  Got no answer.  Left message. 

## 2013-11-21 ENCOUNTER — Encounter: Payer: Self-pay | Admitting: Internal Medicine

## 2013-12-05 ENCOUNTER — Ambulatory Visit (INDEPENDENT_AMBULATORY_CARE_PROVIDER_SITE_OTHER): Payer: Medicaid Other | Admitting: Internal Medicine

## 2013-12-05 ENCOUNTER — Encounter: Payer: Self-pay | Admitting: Internal Medicine

## 2013-12-05 VITALS — BP 106/62 | HR 74 | Temp 97.0°F | Ht 66.0 in | Wt 264.0 lb

## 2013-12-05 DIAGNOSIS — R053 Chronic cough: Secondary | ICD-10-CM

## 2013-12-05 DIAGNOSIS — R05 Cough: Secondary | ICD-10-CM

## 2013-12-05 DIAGNOSIS — J310 Chronic rhinitis: Secondary | ICD-10-CM

## 2013-12-05 DIAGNOSIS — R059 Cough, unspecified: Secondary | ICD-10-CM

## 2013-12-05 MED ORDER — CETIRIZINE HCL 10 MG PO TABS: 10.0000 mg | ORAL_TABLET | Freq: Every day | ORAL | Status: DC

## 2013-12-05 MED ORDER — ALBUTEROL SULFATE HFA 108 (90 BASE) MCG/ACT IN AERS: INHALATION_SPRAY | RESPIRATORY_TRACT | Status: DC

## 2013-12-05 MED ORDER — MOMETASONE FURO-FORMOTEROL FUM 100-5 MCG/ACT IN AERO: INHALATION_SPRAY | RESPIRATORY_TRACT | Status: DC

## 2013-12-05 NOTE — Progress Notes (Addendum)
Subjective:    Patient ID: [name], female    DOB: Sep 27, 1965 MRN: [id number]    Brief patient profile:  34 yowf never smoker HIV 1992 with tendency to cough with strong smells/fumes etc but then around early 2000's developed more of a chronic mostly dry daytime pm cough  (prev dx's as "chronic bronchtis" per per>   then NHL rx with chemo / RT by [facility] then Nov 2014 developed fever / green mucus/ throat swelling and multiple abx > back to clear but breathing did not recover and cough worse than usual so referred by Wendie Agreste to pulmonary clinic.   History of Present Illness  09/26/2013 1st [facility] office visit/ [facility]  Chief Complaint  Patient presents with  . Pulmonary Consult    Referred per Dr. Tommy Medal. Pt reports having bronchitis "for 10 yrs". She c/o increased SOB since Nov 2014. She states that she is SOB "all the time".   was using combivent or albuterol every few days now using every 6h with no relief in cough or sob at rest which is mostly related to coughing fits worse as day goes on, completely dry now Already on ppi but not timed to meals, using lots of throat lozenges which don't help the constant sense of pnds esp at hs but note this doesn't typically disturb her once asleep. rec Prilosec 40 mg Take 30- 60 min before your first and last meals of the day  At bedtime take chlortrimeton 4 mg take 2 at bedtime For cough take percocet up to 4 hours to stop coughing completely and throat clearing x 3 days GERD (REFLUX)  is an extremely common cause of respiratory symptoms, many times with no significant heartburn at all.  Prednisone 10 mg take  4 each am x 2 days,   2 each am x 2 days,  1 each am x 2 days and stop    10/24/2013 f/u ov/[facility] re: cough since Nov 2014  Chief Complaint  Patient presents with  . Follow-up    Pt states her cough is improved but still present.  Also c/o SOB with exertion, heartburn.    much better on prednisone, much less need for  inhalers then off the pred grad onset  chest less tight, some worse cough off it and also cc overt hb desptie ppi and pepcid. rec Dulera 100 Take 2 puffs first thing in am and then another 2 puffs about 12 hours later- work on smooth deep breathing  Only use your albuterol(red= proair)    12/05/2013 f/u ov/[facility] re: asthma well controlled on dulera 100 2bid  Chief Complaint  Patient presents with  . Follow-up    Pt states that her cough continues to improve, and her SOB is some better.   Using rescue inhaler 4 x in last 5 days  Cough mostly in afternoons attributed to drainage from allergies    No obvious day to day or daytime variabilty or assoc  cp or chest tightness, subjective wheeze overt sinus or hb symptoms. No unusual exp hx or h/o childhood pna/ asthma or knowledge of premature birth.  Sleeping ok without nocturnal  or early am exacerbation  of respiratory  c/o's or need for noct saba. Also denies any obvious fluctuation of symptoms with weather or environmental changes or other aggravating or alleviating factors except as outlined above   Current Medications, Allergies, Complete Past Medical History, Past Surgical History, Family History, and Social History were reviewed in [facility] electronic medical record.  ROS  The following are not active complaints unless bolded sore throat, dysphagia, dental problems, itching, sneezing,  nasal congestion or excess/ purulent secretions, ear ache,   fever, chills, sweats, unintended wt loss, pleuritic or exertional cp, hemoptysis,  orthopnea pnd or leg swelling, presyncope, palpitations, heartburn, abdominal pain, anorexia, nausea, vomiting, diarrhea  or change in bowel or urinary habits, change in stools or urine, dysuria,hematuria,  rash, arthralgias, visual complaints, headache, numbness weakness or ataxia or problems with walking or coordination,  change in mood/affect or memory.                    Objective:   Physical  Exam   amb obese wf nad    10/24/2013       259  > 12/05/2013 264  Wt Readings from Last 3 Encounters:  09/26/13 256 lb (116.121 kg)  09/23/13 255 lb (115.667 kg)  08/15/13 246 lb (111.585 kg)     HEENT: nl dentition, turbinates, and orophanx. Nl external ear canals without cough reflex   NECK :  without JVD/Nodes/TM/ nl carotid upstrokes bilaterally   LUNGS: no acc muscle use, clear to A and P bilaterally without cough on insp or exp maneuvers   CV:  RRR  no s3 or murmur or increase in P2, no edema   ABD:  soft and nontender with nl excursion in the supine position. No bruits or organomegaly, bowel sounds nl  MS:  warm without deformities, calf tenderness, cyanosis or clubbing  SKIN: warm and dry without lesions    NEURO:  alert, approp, no deficits   CXR  09/26/2013 : No active cardiopulmonary disease.            Assessment & Plan:   Outpatient Encounter Prescriptions as of 12/05/2013  Medication Sig  . albuterol (PROAIR HFA) 108 (90 BASE) MCG/ACT inhaler use up to 2 puffs  every 4 hours as need for short of breath/ wheezing  . Calcium Carbonate-Vitamin D (CALCIUM-VITAMIN D) 500-200 MG-UNIT per tablet Take 2 tablets by mouth daily.   . ciprofloxacin (CIPRO) 500 MG tablet Take 500 mg by mouth 2 (two) times daily.  . Darunavir-Cobicistat (PREZCOBIX) 800-150 MG TABS Take 1 tablet by mouth daily.  . Diclofenac Potassium 50 MG PACK Take 50 mg by mouth as needed.  Marland Kitchen emtricitabine-tenofovir (TRUVADA) 200-300 MG per tablet Take 1 tablet by mouth daily.  . fluconazole (DIFLUCAN) 100 MG tablet Take 1 tablet (100 mg total) by mouth once.  Marland Kitchen FLUoxetine (PROZAC) 20 MG capsule Take 1 capsule (20 mg total) by mouth daily.  Marland Kitchen LORazepam (ATIVAN) 0.5 MG tablet Take 1 tablet (0.5 mg total) by mouth daily as needed for anxiety.  Marland Kitchen omeprazole (PRILOSEC) 40 MG capsule Take 40 mg by mouth 2 (two) times daily.  Derrill Memo ON 01/21/2014] oxyCODONE-acetaminophen (PERCOCET/ROXICET) 5-325 MG per  tablet Take 1 tablet by mouth every 6 (six) hours as needed.  . promethazine (PHENERGAN) 50 MG tablet Take 0.5-1 tablets (25-50 mg total) by mouth every 6 (six) hours as needed for nausea. Up to 4 times a day as needed for nausea  . scopolamine (TRANSDERM-SCOP) 1 MG/3DAYS Place 1 patch (1.5 mg total) onto the skin every other day as needed.  . Tamsulosin HCl (FLOMAX) 0.4 MG CAPS Take 0.4 mg by mouth daily. 1-2 daily  . terconazole (TERAZOL 3) 0.8 % vaginal cream Place 1 applicator vaginally at bedtime.  . VOLTAREN 1 % GEL APPLY TOPICALLY TWICE A DAY  AS NEEDED FOR PAIN.  Marland Kitchen zolmitriptan (ZOMIG) 5 MG tablet Take 1 tablet (5 mg total) by mouth as needed for migraine.  . [DISCONTINUED] PROAIR HFA 108 (90 BASE) MCG/ACT inhaler TWO PUFFS EVERY SIX HOURS AS NEEDED FOR WHEEZING  . cetirizine (ZYRTEC) 10 MG tablet Take 1 tablet (10 mg total) by mouth daily.  . mometasone-formoterol (DULERA) 100-5 MCG/ACT AERO Take 2 puffs first thing in am and then another 2 puffs about 12 hours later.  . [DISCONTINUED] mometasone-formoterol (DULERA) 100-5 MCG/ACT AERO Take 2 puffs first thing in am and then another 2 puffs about 12 hours later.

## 2013-12-05 NOTE — Patient Instructions (Addendum)
For drainage take chlortrimeton (chlorpheniramine) 4 mg every 4 hours available over the counter (may cause drowsiness) . Or can take Zyrtec 10 mg at bedtime could be used instead    If doing great at night ok to leave off the dulera pm doses but stay on it 2 puffs each am.   If you are satisfied with your treatment plan let your doctor know and he/she can either refill your medications or you can return here when your prescription runs out.     If in any way you are not 100% satisfied,  please tell us.  If 100% better, tell your friends!   Pulmonary follow up can be as needed

## 2013-12-06 NOTE — Assessment & Plan Note (Addendum)
-   trial of dulera 100 2bid 10/24/13 > improved signifincantly > Suggests this is cough variant asthma.  Since has no noct symptoms ok to leave off pm dose of dulera if continues to do well

## 2013-12-07 DIAGNOSIS — J310 Chronic rhinitis: Secondary | ICD-10-CM | POA: Insufficient documentation

## 2013-12-07 NOTE — Assessment & Plan Note (Signed)
Reviewed use of 1st gen H1 as best options for drainage but advised though they are the most effective, they are also the most sedating so can use zyrtec as an alternative.   See instructions for specific recommendations which were reviewed directly with the patient who was given a copy with highlighter outlining the key components.

## 2013-12-10 ENCOUNTER — Other Ambulatory Visit: Payer: Self-pay | Admitting: Sports Medicine

## 2013-12-10 ENCOUNTER — Other Ambulatory Visit: Payer: Self-pay | Admitting: Infectious Disease

## 2013-12-12 ENCOUNTER — Ambulatory Visit (INDEPENDENT_AMBULATORY_CARE_PROVIDER_SITE_OTHER): Payer: Medicaid Other | Admitting: Gastroenterology

## 2013-12-12 ENCOUNTER — Encounter: Payer: Self-pay | Admitting: Gastroenterology

## 2013-12-12 ENCOUNTER — Other Ambulatory Visit: Payer: Self-pay | Admitting: *Deleted

## 2013-12-12 VITALS — BP 108/78 | HR 84 | Ht 65.0 in | Wt 262.5 lb

## 2013-12-12 DIAGNOSIS — K5900 Constipation, unspecified: Secondary | ICD-10-CM

## 2013-12-12 DIAGNOSIS — K219 Gastro-esophageal reflux disease without esophagitis: Secondary | ICD-10-CM

## 2013-12-12 DIAGNOSIS — F3289 Other specified depressive episodes: Secondary | ICD-10-CM

## 2013-12-12 DIAGNOSIS — F329 Major depressive disorder, single episode, unspecified: Secondary | ICD-10-CM

## 2013-12-12 DIAGNOSIS — K649 Unspecified hemorrhoids: Secondary | ICD-10-CM

## 2013-12-12 MED ORDER — LORAZEPAM 0.5 MG PO TABS: 0.5000 mg | ORAL_TABLET | Freq: Every day | ORAL | Status: DC | PRN

## 2013-12-12 MED ORDER — ESOMEPRAZOLE MAGNESIUM 40 MG PO CPDR: 40.0000 mg | DELAYED_RELEASE_CAPSULE | Freq: Two times a day (BID) | ORAL | Status: DC

## 2013-12-12 NOTE — Progress Notes (Signed)
    History of Present Illness: This is a 48 year old female who is a former patient of Dr. Buel Ream. She saw Dr. Ardis Hughs 4 years ago and underwent upper endoscopy and colonoscopy which were normal except for internal and external hemorrhoids. She has chronic GERD and recently has had frequent heartburn symptoms. She states that Nexium 40 mg twice daily worked better for her than her current regimen. She has frequent heartburn symptoms. She has chronic constipation on chronic opioids and takes Senokot as needed. She has frequent hemorrhoidal symptoms after straining with a constipated stool. She tried Linzess in 2013 but this was not effective.  Current Medications, Allergies, Past Medical History, Past Surgical History, Family History and Social History were reviewed in [employer] record.  Physical Exam: General: Well developed , well nourished, obese, no acute distress Head: Normocephalic and atraumatic Eyes:  sclerae anicteric, EOMI Ears: Normal auditory acuity Mouth: No deformity or lesions Lungs: Clear throughout to auscultation Heart: Regular rate and rhythm; no murmurs, rubs or bruits Abdomen: Soft, non tender and non distended. No masses, hepatosplenomegaly or hernias noted. Normal Bowel sounds Musculoskeletal: Symmetrical with no gross deformities  Pulses:  Normal pulses noted Extremities: No clubbing, cyanosis, edema or deformities noted Neurological: Alert oriented x 4, grossly nonfocal Psychological:  Alert and cooperative. Normal mood and affect  Assessment and Recommendations:  1. GERD. Change to Nexium 40 mg twice daily. Reintensify all standard antireflux measures. Since she was previously evaluated by Dr. Ardis Hughs followup will be arranged with him. REV in 1 year.  2. Chronic constipation. Daily or every other day laxative regimen with either Senokot or MiraLax per her preference.  3. Internal/external hemorrhoids. Standard rectal care instructions  and Analpram twice a day when necessary.

## 2013-12-12 NOTE — Patient Instructions (Addendum)
We have sent the following medications to your pharmacy for you to pick up at your convenience: Nexium 40 mg to take one tablet by mouth twice daily.   You have been given samples of Analpram to use rectally daily as needed.  Patient advised to avoid spicy, acidic, citrus, chocolate, mints, fruit and fruit juices.  Limit the intake of caffeine, alcohol and Soda.  Don't exercise too soon after eating.  Don't lie down within 3-4 hours of eating.  Elevate the head of your bed.  RECTAL CARE INSTRUCTIONS:  1. Sitz Baths twice a day for 10 minutes each. 2. Thoroughly clean and dry the rectum. 3. Put Tucks pad against the rectum at night. 4. Clean the rectum with Balenol lotion after each bowel movement.  Thank you for choosing me and [facility].  Pricilla Riffle. Dagoberto Ligas., MD., Mar[facility] Regal

## 2013-12-12 NOTE — Telephone Encounter (Signed)
Lorazepam rx called to [facility].

## 2013-12-23 NOTE — Addendum Note (Signed)
Addended by: Hulan Fray on: 12/23/2013 06:49 PM   Modules accepted: Orders

## 2013-12-31 ENCOUNTER — Telehealth: Payer: Self-pay | Admitting: Dietician

## 2013-12-31 NOTE — Telephone Encounter (Signed)
Called to offer health coaching for weight loss. Patient agreed to schedule when she makes her next appointment.

## 2014-01-09 ENCOUNTER — Other Ambulatory Visit: Payer: Self-pay | Admitting: Infectious Disease

## 2014-01-09 DIAGNOSIS — B370 Candidal stomatitis: Secondary | ICD-10-CM

## 2014-01-10 ENCOUNTER — Other Ambulatory Visit: Payer: Self-pay | Admitting: *Deleted

## 2014-01-10 MED ORDER — LORAZEPAM 0.5 MG PO TABS: 0.5000 mg | ORAL_TABLET | Freq: Every day | ORAL | Status: DC | PRN

## 2014-01-10 NOTE — Telephone Encounter (Signed)
Lorazepam rx called to [facility].

## 2014-01-16 ENCOUNTER — Other Ambulatory Visit: Payer: Self-pay | Admitting: Neurology

## 2014-01-17 ENCOUNTER — Other Ambulatory Visit: Payer: Self-pay | Admitting: Neurology

## 2014-01-24 ENCOUNTER — Other Ambulatory Visit: Payer: Self-pay | Admitting: Neurology

## 2014-01-24 NOTE — Telephone Encounter (Signed)
Dr Krista Blue already approved this Rx on 07/17

## 2014-01-29 ENCOUNTER — Ambulatory Visit: Payer: Medicaid Other | Admitting: Neurology

## 2014-02-05 ENCOUNTER — Encounter: Payer: Self-pay | Admitting: Neurology

## 2014-02-05 ENCOUNTER — Ambulatory Visit (INDEPENDENT_AMBULATORY_CARE_PROVIDER_SITE_OTHER): Payer: Medicaid Other | Admitting: Neurology

## 2014-02-05 VITALS — Wt 262.0 lb

## 2014-02-05 DIAGNOSIS — G43719 Chronic migraine without aura, intractable, without status migrainosus: Secondary | ICD-10-CM

## 2014-02-05 DIAGNOSIS — G43909 Migraine, unspecified, not intractable, without status migrainosus: Secondary | ICD-10-CM

## 2014-02-05 MED ORDER — ZOLMITRIPTAN 5 MG PO TABS: 5.0000 mg | ORAL_TABLET | ORAL | Status: DC | PRN

## 2014-02-05 MED ORDER — DICLOFENAC POTASSIUM(MIGRAINE) 50 MG PO PACK: 50.0000 mg | PACK | ORAL | Status: DC | PRN

## 2014-02-05 NOTE — Progress Notes (Signed)
HPI:   [name] is a 48 year old Caucasian female with past medical history of HIV, high cholesterol, migraines, depression, and non-Hodgkin lymphoma in 2005 with chemotherapy and radiation.   She has history of migraines since 19. In the last 10 years, she reports having a migraines daily. She is currently taking Topamax and Zomig with relief. She takes approximately 20-25 tablets of her Zomig per month.    She has aura's that look like floating dirt particles, flashing lights, and she sees Christmas tree lights when they are severe, she has nausea, vomiting and sometimes diarrhea when they're severe. She also complains of diplopia, blurry vision, and  photophobia. She also has had a lightning bolt sensation throughout her body.  She describes them as pulsating and throbbing  on a scale of 5-6/10 which she considers mild, when they are severe they can go as high as 10/10. They usually start in the right temporal area, but will also start on the left temporal and are worse.   Any type of smoke or MSG can precipitate a migraine. They generally last 2-9 days. She has been to the emergency department twice in the last year for migraines. She states "I just can't do this anymore I need to do something about her migraines"  She has tried in the past Maxalt, Imitrex, atenolol, verapamil, nortriptyline, Inderal, Depakote without benefit. Atenolol and verapamil caused hypotension. Depakote caused severe vomiting. Topamax causing her worsening headaches, I also put her on Zonegran, she denies significant improvement.  I started to give her BOTOX injection since May 2013, she responded very well,   Previous MRI brain was normal in 2013.  UPDATE April 29th 2015:  Last injection was in Jan 2015, she responded very well, there was no significant side effect noticed. She noticed increased headache over few weeks,  couple times a week, Zomig was helpful,  Physical Exam  General: Well groomed and obese  female Neck: supple no carotid bruits Respiratory: clear to auscultation bilaterally Cardiovascular: regular rate rhythm She wear right knee brace  Neurologic Exam  Mental Status: obese, pleasant, awake, alert, cooperative to history, talking, and casual conversation. Cranial Nerves: CN II-XII pupils were equal round reactive to light. Extraocular movements were full.  Visual fields were full on confrontational test.  Facial sensation and strength were normal.   Uvula tongue were midline.  Head turning and shoulder shrugging were normal and symmetric.  Tongue protrusion into the cheeks strength were normal.  Motor: Normal tone, bulk, and strength. Sensory: Normal to light touch, pinprick, proprioception except decreased vibratory sensation to her right lower extremity she relates to peripheral neuropathy.  Decreased vibratory sensation to left upper extremity.   Coordination: There was no dysmetria noticed. Gait and Station: Narrow based and steady,  Reflexes: Deep tendon reflexes: present and symmetric.  Plantar responses are flexor.   Assessment and plan he has an 48 year-old Caucasian female with a history of HIV, hypercholesterolemia, Non hodgkin' lymphoma with chemotherapy and radiation and migraines since age 11.   She has tried and failed multiple preventive medications including atenolol, verapamil Maxalt, Imitrex, nortriptyline, and Depakote,Topamax,   She has migraines daily for the last 10 years. Currently taking  zonogram  and Zomig with some relief.   Normal MRI brain, and neurological exam.   BOTOX injection was performed according to protocol by [facility].  was exception of skipping injection to frontalis this time, because of decreased wrinkle frontalis muscle movement  100 units of BOTOX /2 cc NS.  Total of  200 units,    Temporalis 8 sites,  40 units  Occipitalis 6 sites, 30 units Cervical Paraspinal, 12 sites, 60 units Trapezius, 8 sites, 40 units Cervical  paraspinals 30 units  Patient tolerate the injection well. Will return for repeat injection in 3 months.

## 2014-02-10 ENCOUNTER — Other Ambulatory Visit: Payer: Self-pay | Admitting: Infectious Disease

## 2014-02-11 ENCOUNTER — Other Ambulatory Visit: Payer: Self-pay | Admitting: Internal Medicine

## 2014-02-11 ENCOUNTER — Other Ambulatory Visit: Payer: Self-pay | Admitting: *Deleted

## 2014-02-11 MED ORDER — ALBUTEROL SULFATE HFA 108 (90 BASE) MCG/ACT IN AERS: INHALATION_SPRAY | RESPIRATORY_TRACT | Status: DC

## 2014-02-11 MED ORDER — LORAZEPAM 0.5 MG PO TABS: 0.5000 mg | ORAL_TABLET | Freq: Every day | ORAL | Status: DC | PRN

## 2014-02-12 NOTE — Telephone Encounter (Signed)
Called to pharm 

## 2014-02-13 ENCOUNTER — Other Ambulatory Visit: Payer: Self-pay | Admitting: *Deleted

## 2014-02-13 DIAGNOSIS — G894 Chronic pain syndrome: Secondary | ICD-10-CM

## 2014-02-13 NOTE — Telephone Encounter (Signed)
Would like 3 scripts, pt has appt 9/22

## 2014-02-14 ENCOUNTER — Telehealth: Payer: Self-pay | Admitting: Internal Medicine

## 2014-02-14 DIAGNOSIS — G894 Chronic pain syndrome: Secondary | ICD-10-CM

## 2014-02-14 MED ORDER — OXYCODONE-ACETAMINOPHEN 5-325 MG PO TABS: 1.0000 | ORAL_TABLET | Freq: Four times a day (QID) | ORAL | Status: DC | PRN

## 2014-02-14 NOTE — Telephone Encounter (Signed)
Refilled percocet for only one month until her follow up visit with me next month. Prescription dropped off in office.   Thanks,  Dr q

## 2014-02-24 ENCOUNTER — Telehealth: Payer: Self-pay | Admitting: *Deleted

## 2014-02-24 DIAGNOSIS — G894 Chronic pain syndrome: Secondary | ICD-10-CM

## 2014-02-24 NOTE — Telephone Encounter (Signed)
Pt is picking up her narcotic script tomorrow, she states she will be seeing you in September but now her appt leaves a gap of appr 4 days between script ending and visit, could you do her sept script now? She just didn't want to make that long trip twice that week. Please advise

## 2014-02-25 MED ORDER — OXYCODONE-ACETAMINOPHEN 5-325 MG PO TABS: 1.0000 | ORAL_TABLET | Freq: Four times a day (QID) | ORAL | Status: DC | PRN

## 2014-02-25 NOTE — Telephone Encounter (Signed)
Pt.notified

## 2014-02-25 NOTE — Telephone Encounter (Signed)
Refilled for September today as well.   Thanks, dr q

## 2014-03-05 ENCOUNTER — Telehealth: Payer: Self-pay | Admitting: Neurology

## 2014-03-05 NOTE — Telephone Encounter (Signed)
All requested info has been submitted to ins.  I called the patient back, got no answer.  Left message.

## 2014-03-05 NOTE — Telephone Encounter (Signed)
Patient stated zolmitriptan (ZOMIG) 5 MG tablet needs prior authorization.  Please call anytime and may leave detailed message on voice mail.

## 2014-03-08 ENCOUNTER — Other Ambulatory Visit: Payer: Self-pay | Admitting: Sports Medicine

## 2014-03-25 ENCOUNTER — Encounter: Payer: Medicaid Other | Admitting: Internal Medicine

## 2014-04-01 ENCOUNTER — Encounter: Payer: Self-pay | Admitting: Internal Medicine

## 2014-04-01 ENCOUNTER — Telehealth: Payer: Self-pay | Admitting: *Deleted

## 2014-04-01 ENCOUNTER — Other Ambulatory Visit: Payer: Self-pay

## 2014-04-01 ENCOUNTER — Ambulatory Visit (INDEPENDENT_AMBULATORY_CARE_PROVIDER_SITE_OTHER): Payer: Medicaid Other | Admitting: Internal Medicine

## 2014-04-01 VITALS — BP 123/68 | HR 87 | Temp 98.1°F | Wt 265.1 lb

## 2014-04-01 DIAGNOSIS — Z23 Encounter for immunization: Secondary | ICD-10-CM

## 2014-04-01 DIAGNOSIS — G894 Chronic pain syndrome: Secondary | ICD-10-CM

## 2014-04-01 DIAGNOSIS — G5601 Carpal tunnel syndrome, right upper limb: Secondary | ICD-10-CM

## 2014-04-01 DIAGNOSIS — Z0000 Encounter for general adult medical examination without abnormal findings: Secondary | ICD-10-CM

## 2014-04-01 DIAGNOSIS — Z1231 Encounter for screening mammogram for malignant neoplasm of breast: Secondary | ICD-10-CM

## 2014-04-01 DIAGNOSIS — F329 Major depressive disorder, single episode, unspecified: Secondary | ICD-10-CM

## 2014-04-01 DIAGNOSIS — F3289 Other specified depressive episodes: Secondary | ICD-10-CM

## 2014-04-01 DIAGNOSIS — G5600 Carpal tunnel syndrome, unspecified upper limb: Secondary | ICD-10-CM

## 2014-04-01 MED ORDER — OXYCODONE-ACETAMINOPHEN 5-325 MG PO TABS: 1.0000 | ORAL_TABLET | Freq: Four times a day (QID) | ORAL | Status: DC | PRN

## 2014-04-01 MED ORDER — FLUOXETINE HCL 20 MG PO CAPS: 40.0000 mg | ORAL_CAPSULE | Freq: Every day | ORAL | Status: DC

## 2014-04-01 MED ORDER — LORAZEPAM 0.5 MG PO TABS: 0.5000 mg | ORAL_TABLET | Freq: Every day | ORAL | Status: DC | PRN

## 2014-04-01 NOTE — Patient Instructions (Signed)
General Instructions: We have refilled your pain medication for the next three months Please get a wrist splint for your right wrist and try to wear at night for support  Please bring your medicines with you each time you come to clinic.  Medicines may include prescription medications, over-the-counter medications, herbal remedies, eye drops, vitamins, or other pills.   Progress Toward Treatment Goals:  Treatment Goal 08/06/2013  Prevent falls at goal    Self Care Goals & Plans:  Self Care Goal 08/06/2013  Manage my medications take my medicines as prescribed    No flowsheet data found.   Care Management & Community Referrals:  No flowsheet data found.

## 2014-04-01 NOTE — Telephone Encounter (Signed)
Patient wants to schedule appt for Cervical Injection

## 2014-04-01 NOTE — Progress Notes (Signed)
Subjective:   Patient ID: [name] female   DOB: September 16, 1965 48 y.o.   MRN: [id number]  HPI: [name] is a 48 y.o. female with HIV and hx of NHL and anxiety presenting to opc today for routine follow up visit.   Depression/Anxiety: prozac initially helped, improved mood and anxiety but then for the past month feels like the effect has wore off. She would like to stay on prozac but is interested in change of dose as we discussed.   She is also endorsing right wrist pain x1.5 week. She thinks she may have hurt it but unclear of injury mechanism. Pain noted worse with movement and difficult grabbing door knobs but mobility maintained. No obvious inflammation, bruising, or mass of wrist. She does type often. Occasional tingling sensation in fingers of right hand.   Past Medical History  Diagnosis Date  . Anemia 10/2009    macrocytic anemia with baseline MCV 104-106  . PUD (peptic ulcer disease) 07/1997    per EGD report 07/1997 with history of esophagitis  . Migraines     on topamax and triptans, frequent (almost daily) attacks   . Hyperlipidemia   . HIV infection     CD4 = 570 (05/2010), VL undetectation  . Anxiety   . Spondylolisthesis of lumbar region     L5-S1  . Gastroparesis   . Shingles     in lumbar dermatome  . Hypertension     no meds  . Non Hodgkin's lymphoma     stage II, s/p resection, chemotherapy, radiotherapy, Dr. Beryle Beams is her oncologist.   . Asthma   . DVT (deep venous thrombosis)     history clot lt groin when she had lymphoma-in remision now.  . Traumatic tear of lateral meniscus of right knee 04/20/2012  . 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 Hiatal hernia 01/01/2002  . Acute gastritis without mention of hemorrhage 01/01/2002  . Duodenitis without mention of hemorrhage 01/01/2002  . GERD (gastroesophageal reflux disease)   . IBS (irritable bowel syndrome)    Current Outpatient Prescriptions  Medication Sig Dispense Refill  . albuterol  (PROAIR HFA) 108 (90 BASE) MCG/ACT inhaler use up to 2 puffs  every 4 hours as need for short of breath/ wheezing  8.5 g  0  . BOTOX 100 UNITS SOLR injection INJECT 200 UNITS IM INTO  HEAD NECK EVERY 3 MONTHS  2 each  3  . Calcium Carbonate-Vitamin D (CALCIUM-VITAMIN D) 500-200 MG-UNIT per tablet Take 2 tablets by mouth daily.       . cetirizine (ZYRTEC) 10 MG tablet Take 1 tablet (10 mg total) by mouth daily.  30 tablet  11  . ciprofloxacin (CIPRO) 500 MG tablet Take 500 mg by mouth 2 (two) times daily.      . Darunavir-Cobicistat (PREZCOBIX) 800-150 MG TABS Take 1 tablet by mouth daily.  30 tablet  11  . Diclofenac Potassium 50 MG PACK Take 50 mg by mouth as needed.  15 each  11  . emtricitabine-tenofovir (TRUVADA) 200-300 MG per tablet Take 1 tablet by mouth daily.  30 tablet  11  . esomeprazole (NEXIUM) 40 MG capsule Take 1 capsule (40 mg total) by mouth 2 (two) times daily before a meal.  60 capsule  11  . fluconazole (DIFLUCAN) 100 MG tablet TAKE 1 TABLET BY MOUTH EVERY DAY OR AS DIRECTED  10 tablet  0  . FLUoxetine (PROZAC) 20 MG capsule Take 1 capsule (20 mg total) by  mouth daily.  60 capsule  3  . LORazepam (ATIVAN) 0.5 MG tablet Take 1 tablet (0.5 mg total) by mouth daily as needed for anxiety.  30 tablet  0  . mometasone-formoterol (DULERA) 100-5 MCG/ACT AERO Take 2 puffs first thing in am and then another 2 puffs about 12 hours later.  1 Inhaler  11  . oxyCODONE-acetaminophen (PERCOCET/ROXICET) 5-325 MG per tablet Take 1 tablet by mouth every 6 (six) hours as needed for severe pain.  120 tablet  0  . promethazine (PHENERGAN) 50 MG tablet Take 0.5-1 tablets (25-50 mg total) by mouth every 6 (six) hours as needed for nausea. Up to 4 times a day as needed for nausea  90 tablet  11  . scopolamine (TRANSDERM-SCOP) 1 MG/3DAYS Place 1 patch (1.5 mg total) onto the skin every other day as needed.  15 patch  6  . Tamsulosin HCl (FLOMAX) 0.4 MG CAPS Take 0.4 mg by mouth daily. 1-2 daily      .  terconazole (TERAZOL 3) 0.8 % vaginal cream Place 1 applicator vaginally at bedtime.  20 g  5  . VOLTAREN 1 % GEL APPLY TOPICALLY TWICE DAILY AS NEEDED FOR PAIN  100 g  1  . zolmitriptan (ZOMIG) 5 MG tablet Take 1 tablet (5 mg total) by mouth as needed for migraine.  15 tablet  11   No current facility-administered medications for this visit.   Family History  Problem Relation Age of Onset  . Hypertension Mother   . Stroke Mother   . Breast cancer Paternal Grandmother   . Kidney cancer Maternal Uncle   . Diabetes Maternal Uncle   . Diabetes Maternal Grandmother   . Emphysema Paternal Uncle     never smoker  . Emphysema Paternal Grandfather     never smoker  . Colon cancer Paternal Aunt     with met to lung   . Non-Hodgkin's lymphoma Maternal Uncle    History   Social History  . Marital Status: Divorced    Spouse Name: N/A    Number of Children: 0  . Years of Education: 12   Occupational History  . disabled    Social History Main Topics  . Smoking status: Never Smoker   . Smokeless tobacco: Never Used  . Alcohol Use: Yes  . Drug Use: No  . Sexual Activity: Not on file   Other Topics Concern  . Not on file   Social History Narrative      Patient is disabled.   Right handed.   Caffeine - Diet soda., tea   Education - high school   Review of Systems:  Constitutional:  Denies fever, chills  HEENT:  Chronic Congestion  Respiratory:  Denies SOB  Cardiovascular:  Denies chest pain  Gastrointestinal:  Denies nausea, vomiting, abdominal pain  Genitourinary:  Denies dysuria  Musculoskeletal:  R wrist pain  Skin:  Denies pallor, rash and wound.   Neurological:  Chronic migraines   Objective:  Physical Exam: Filed Vitals:   04/01/14 1427  BP: 123/68  Pulse: 87  Temp: 98.1 F (36.7 C)  TempSrc: Oral  Weight: 265 lb 1.6 oz (120.249 kg)  SpO2: 98%   Vitals reviewed. General: sitting in chair, NAD HEENT: EOMI Cardiac: RRR Pulm: clear to auscultation  bilaterally, no wheezes, rales, or rhonchi Abd: soft, obese, BS present Ext: warm and well perfused, no edema, moving all 4 extremities. R wrist pain on movement but rotation intact. Strength of R hand slightly decreased  due to pain but able to squeeze evenly when directed and tolerating pain. Slight pain on phalen testing of right wrist. She did endorse some tingling with tinel test but not full median nerve territory on exam. Mild tenderness to palpation of inner wrist. No obvious fracture, mass, inflammation, or erythema in region Neuro: alert and oriented X3, strength and sensation to light touch equal in bilateral upper and lower extremities   Assessment & Plan:  Discussed with Dr. Dareen Piano

## 2014-04-02 DIAGNOSIS — G5600 Carpal tunnel syndrome, unspecified upper limb: Secondary | ICD-10-CM | POA: Insufficient documentation

## 2014-04-02 DIAGNOSIS — Z0000 Encounter for general adult medical examination without abnormal findings: Secondary | ICD-10-CM | POA: Insufficient documentation

## 2014-04-02 NOTE — Assessment & Plan Note (Signed)
Refilled percocet for next three months--October, November, and December

## 2014-04-02 NOTE — Progress Notes (Signed)
INTERNAL MEDICINE TEACHING ATTENDING ADDENDUM - Tamotsu Wiederholt, MD: I reviewed and discussed at the time of visit with the resident Dr. Qureshi, the patient's medical history, physical examination, diagnosis and results of pertinent tests and treatment and I agree with the patient's care as documented.  

## 2014-04-02 NOTE — Assessment & Plan Note (Addendum)
Immunizations today: flu and prevnar vaccine

## 2014-04-02 NOTE — Assessment & Plan Note (Signed)
Improved with prozac but feels like not as effective over the past month.   -will increase prozac from 20mg  to 40mg  and consider titration up as needed -continue ativan prn

## 2014-04-02 NOTE — Assessment & Plan Note (Signed)
X1 week. 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   Will try conservative measures for now. Recommended wrist splint for now and see if any improvement.

## 2014-04-03 ENCOUNTER — Telehealth: Payer: Self-pay | Admitting: *Deleted

## 2014-04-03 NOTE — Telephone Encounter (Signed)
Given location and timing this likely represents an acute local inflammatory reaction to the vaccination.  If ice was not helpful I would recommend taking over the counter non-steroidal anti-inflammatories such as ibuprofen or naprosyn.  If after 24 hours the area has not started to improve she should call the clinic to let us know and for additional advice.  Thank You.

## 2014-04-03 NOTE — Telephone Encounter (Signed)
Pt informed and voices understanding.  She will call back tomorrow.

## 2014-04-03 NOTE — Telephone Encounter (Signed)
Pt called stating she received a flu and pneumonia vaccine on 9/29, now her arm is swollen and she has a red spot the size of a 50 cent piece, it's red and burning Right arm is where she received the pneumonia vaccine. She describes a knot, whelp and bright red area.  It's warm to touch, hurts to move the arm and she feels soreness up to her shoulder blade. She has used ice to treat but the area is getting worst.   Pt # [phone]

## 2014-04-04 ENCOUNTER — Encounter: Payer: Self-pay | Admitting: Internal Medicine

## 2014-04-04 ENCOUNTER — Other Ambulatory Visit: Payer: Self-pay | Admitting: Infectious Disease

## 2014-04-04 ENCOUNTER — Other Ambulatory Visit: Payer: Self-pay | Admitting: Sports Medicine

## 2014-04-04 ENCOUNTER — Ambulatory Visit (INDEPENDENT_AMBULATORY_CARE_PROVIDER_SITE_OTHER): Payer: Medicaid Other | Admitting: Internal Medicine

## 2014-04-04 VITALS — BP 107/58 | HR 80 | Temp 98.4°F | Wt 263.3 lb

## 2014-04-04 DIAGNOSIS — T50Z95A Adverse effect of other vaccines and biological substances, initial encounter: Secondary | ICD-10-CM

## 2014-04-04 DIAGNOSIS — B20 Human immunodeficiency virus [HIV] disease: Secondary | ICD-10-CM

## 2014-04-04 NOTE — Assessment & Plan Note (Addendum)
Pt received Prevnar vaccine in RUE and flu vaccine in her LUE on 04/02/14. Since that time she has had increasing erythema, warmth, and pain at the injection site. She initially stated that she tried icing the site and taking ibuprofen without improvement; however, later she endorsed to me that she did have improvement of the swelling at the site with cold compresses. She denies hives, swelling of the face, mouth, or throat. I suspect that this is just a local reaction from the Prevnar vaccine, which according to UpToDate, has a >10% risk of local reaction. I do not feel that this is cellulitis. - Advised pt to continue cold compresses - Continue Ibuprofen 400mg  q4-6h PRN for pain and erythema - Call the clinic on Monday if the site is worsening.   Addendum: 04/08/14: Pt has called the clinic a number of times stating that the site looks worse. However, she describes the site to be as large as the width of 2 fingers to nursing. It was 5x5cm on Fri. I called the patient back and the pt states that the site is the length of 2 fingers, "the thumb and index fingers", which would still be approx 5cm. She states that it is still warm and tender to the touch, but is larger and has changed in character, now with "white spots" located within the erythema that are somewhat raised. She denies any drainage. I had advised her on Friday that if the erythema worsened over the weekend, I would call in an antibiotic for her for possible cellulitis. However with this change in character/quality of the site, I advised her that she needed to be seen before any new medication could be prescribed. I told her that we would see her here in the clinic, or she could go to a local urgent care or ED. She declined to seek further medical care, despite my strong encouragement for the site to be further evaluated. Hopefully she will call the clinic to schedule an appointment. She did however state that she would call her ID doctor to get an  antibiotic, but by her description of the site, she should be evaluated 1st.

## 2014-04-04 NOTE — Progress Notes (Signed)
Patient ID: [name], female   DOB: 12-27-1965, 48 y.o.   MRN: [id number]  Subjective:   Patient ID: [name] female   DOB: [birth year] 48 y.o.   MRN: [id number]  HPI: [name] is a 48 y.o. F who was seen 9/30 and received a flu vaccine and the Prevnar vaccine and now c/o worsening edema and pain at the Prevnar injection site.   She states that after the Prevnar injection in her right arm, she developed a dime sized area of erythema and swelling with pain, which as worsened every day and is now causing pain that is radiating into her shoulder. She has been using ibuprofen, 2 tablets 4x daily and is applying ice packs to the site. She states that the swelling has improved somewhat with the ice packs.   She denies any SOB, throat closing, itching, or hives. Endorses low grade temps around 100.24F   Past Medical History  Diagnosis Date  . Anemia 10/2009    macrocytic anemia with baseline MCV 104-106  . PUD (peptic ulcer disease) 07/1997    per EGD report 07/1997 with history of esophagitis  . Migraines     on topamax and triptans, frequent (almost daily) attacks   . Hyperlipidemia   . HIV infection     CD4 = 570 (05/2010), VL undetectation  . Anxiety   . Spondylolisthesis of lumbar region     L5-S1  . Gastroparesis   . Shingles     in lumbar dermatome  . Hypertension     no meds  . Non Hodgkin's lymphoma     stage II, s/p resection, chemotherapy, radiotherapy, Dr. Beryle Beams is her oncologist.   . Asthma   . DVT (deep venous thrombosis)     history clot lt groin when she had lymphoma-in remision now.  . Traumatic tear of lateral meniscus of right knee 04/20/2012  . 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 Hiatal hernia 01/01/2002  . Acute gastritis without mention of hemorrhage 01/01/2002  . Duodenitis without mention of hemorrhage 01/01/2002  . GERD (gastroesophageal reflux disease)   . IBS (irritable bowel syndrome)    Current Outpatient Prescriptions  Medication Sig  Dispense Refill  . albuterol (PROAIR HFA) 108 (90 BASE) MCG/ACT inhaler use up to 2 puffs  every 4 hours as need for short of breath/ wheezing  8.5 g  0  . BOTOX 100 UNITS SOLR injection INJECT 200 UNITS IM INTO  HEAD NECK EVERY 3 MONTHS  2 each  3  . Calcium Carbonate-Vitamin D (CALCIUM-VITAMIN D) 500-200 MG-UNIT per tablet Take 2 tablets by mouth daily.       . cetirizine (ZYRTEC) 10 MG tablet Take 1 tablet (10 mg total) by mouth daily.  30 tablet  11  . Darunavir-Cobicistat (PREZCOBIX) 800-150 MG TABS Take 1 tablet by mouth daily.  30 tablet  11  . Diclofenac Potassium 50 MG PACK Take 50 mg by mouth as needed.  15 each  11  . emtricitabine-tenofovir (TRUVADA) 200-300 MG per tablet Take 1 tablet by mouth daily.  30 tablet  11  . esomeprazole (NEXIUM) 40 MG capsule Take 1 capsule (40 mg total) by mouth 2 (two) times daily before a meal.  60 capsule  11  . fluconazole (DIFLUCAN) 100 MG tablet TAKE 1 TABLET BY MOUTH EVERY DAY OR AS DIRECTED  10 tablet  0  . FLUoxetine (PROZAC) 20 MG capsule Take 2 capsules (40 mg total) by mouth daily.  60 capsule  3  . LORazepam (ATIVAN) 0.5 MG tablet Take 1 tablet (0.5 mg total) by mouth daily as needed for anxiety.  30 tablet  1  . mometasone-formoterol (DULERA) 100-5 MCG/ACT AERO Take 2 puffs first thing in am and then another 2 puffs about 12 hours later.  1 Inhaler  11  . [START ON 06/23/2014] oxyCODONE-acetaminophen (PERCOCET/ROXICET) 5-325 MG per tablet Take 1 tablet by mouth every 6 (six) hours as needed for severe pain.  120 tablet  0  . promethazine (PHENERGAN) 50 MG tablet Take 0.5-1 tablets (25-50 mg total) by mouth every 6 (six) hours as needed for nausea. Up to 4 times a day as needed for nausea  90 tablet  11  . scopolamine (TRANSDERM-SCOP) 1 MG/3DAYS Place 1 patch (1.5 mg total) onto the skin every other day as needed.  15 patch  6  . Tamsulosin HCl (FLOMAX) 0.4 MG CAPS Take 0.4 mg by mouth daily. 1-2 daily      . terconazole (TERAZOL 3) 0.8 %  vaginal cream Place 1 applicator vaginally at bedtime.  20 g  5  . VOLTAREN 1 % GEL APPLY TOPICALLY TWICE DAILY AS NEEDED FOR PAIN  100 g  1  . zolmitriptan (ZOMIG) 5 MG tablet Take 1 tablet (5 mg total) by mouth as needed for migraine.  15 tablet  11   No current facility-administered medications for this visit.   Family History  Problem Relation Age of Onset  . Hypertension Mother   . Stroke Mother   . Breast cancer Paternal Grandmother   . Kidney cancer Maternal Uncle   . Diabetes Maternal Uncle   . Diabetes Maternal Grandmother   . Emphysema Paternal Uncle     never smoker  . Emphysema Paternal Grandfather     never smoker  . Colon cancer Paternal Aunt     with met to lung   . Non-Hodgkin's lymphoma Maternal Uncle    History   Social History  . Marital Status: Divorced    Spouse Name: N/A    Number of Children: 0  . Years of Education: 12   Occupational History  . disabled    Social History Main Topics  . Smoking status: Never Smoker   . Smokeless tobacco: Never Used  . Alcohol Use: Yes  . Drug Use: No  . Sexual Activity: None   Other Topics Concern  . None   Social History Narrative      Patient is disabled.   Right handed.   Caffeine - Diet soda., tea   Education - high school   Review of Systems: A 12 point ROS was performed; pertinent positives and negatives were noted in the HPI   Objective:  Physical Exam: Filed Vitals:   04/04/14 1404  BP: 107/58  Pulse: 80  Temp: 98.4 F (36.9 C)  TempSrc: Oral  Weight: 263 lb 4.8 oz (119.432 kg)  SpO2: 98%   Constitutional: Vital signs reviewed.  Patient is a well-developed and well-nourished female in no acute distress and cooperative with exam. Alert and oriented x3.  Head: Normocephalic and atraumatic Eyes: PERRL, EOMI. No scleral icterus.  Cardiovascular: RRR, no MRG Pulmonary/Chest: Normal respiratory effort, CTAB, no wheezes, rales, or rhonchi Abdominal: Soft. Non-tender,  non-distended Musculoskeletal: Moves all 4 extremities. Neurological: A&O x3, no focal deficits, CN 2-12 intact.  Skin: Right arm with large 5x5 cm well circumscribed area of erythema and warmth, which is tender to palpation, no fluctuance appreciated.  Psychiatric: Normal mood and affect. speech  and behavior is normal.   Assessment & Plan:   Please refer to Problem List based Assessment and Plan

## 2014-04-04 NOTE — Patient Instructions (Signed)
Continue to apply ice packs to the injection site. Continue to take the Ibuprofen 400mg  every 4-6 hours as needed for paina dn to help reduce the inflammation in your arm.  If the redness and swelling worsen over the weekend, please give me a call on Monday.    General Instructions:   Please bring your medicines with you each time you come to clinic.  Medicines may include prescription medications, over-the-counter medications, herbal remedies, eye drops, vitamins, or other pills.   Progress Toward Treatment Goals:  Treatment Goal 08/06/2013  Prevent falls at goal    Self Care Goals & Plans:  Self Care Goal 08/06/2013  Manage my medications take my medicines as prescribed    No flowsheet data found.   Care Management & Community Referrals:  No flowsheet data found.

## 2014-04-07 ENCOUNTER — Telehealth: Payer: Self-pay | Admitting: *Deleted

## 2014-04-07 NOTE — Telephone Encounter (Signed)
Pt called injection site on arm is still red and swollen. Pt states looks worse. Uses [facility]. Hilda Blades Deloyce Walthers RN 04/07/14 11:20AM

## 2014-04-07 NOTE — Progress Notes (Signed)
INTERNAL MEDICINE TEACHING ATTENDING ADDENDUM - Tadd Holtmeyer, MD: I reviewed and discussed at the time of visit with the resident Dr. Glenn, the patient's medical history, physical examination, diagnosis and results of pertinent tests and treatment and I agree with the patient's care as documented.  

## 2014-04-08 NOTE — Telephone Encounter (Signed)
Please call her and ask her to describe the redness and measure it. The site appeared to be an injection reaction and not cellulitis, so I am hesitant to prescribe antibiotics. Thanks!

## 2014-04-14 ENCOUNTER — Other Ambulatory Visit: Payer: Medicaid Other

## 2014-04-14 ENCOUNTER — Ambulatory Visit: Payer: Medicaid Other

## 2014-04-17 ENCOUNTER — Telehealth: Payer: Self-pay | Admitting: *Deleted

## 2014-04-17 NOTE — Telephone Encounter (Signed)
Pt request refill on Terconazole and [facility] will not let my enter.  Will you refill?

## 2014-04-17 NOTE — Telephone Encounter (Signed)
What are her  symptoms?  

## 2014-04-18 ENCOUNTER — Other Ambulatory Visit: Payer: Medicaid Other

## 2014-04-21 ENCOUNTER — Other Ambulatory Visit: Payer: Self-pay | Admitting: *Deleted

## 2014-04-21 DIAGNOSIS — B20 Human immunodeficiency virus [HIV] disease: Secondary | ICD-10-CM

## 2014-04-21 DIAGNOSIS — R339 Retention of urine, unspecified: Secondary | ICD-10-CM

## 2014-04-21 MED ORDER — TERCONAZOLE 0.8 % VA CREA: 1.0000 | TOPICAL_CREAM | Freq: Every day | VAGINAL | Status: DC

## 2014-04-21 NOTE — Telephone Encounter (Signed)
Pt called, no answer.  Message left to call clinic

## 2014-04-21 NOTE — Telephone Encounter (Signed)
Another call to pt, she was at home but not able to talk.  Will call back.

## 2014-04-21 NOTE — Telephone Encounter (Signed)
C/o vaginal itching d/t HIV medications - uses on the outside; which she states does not happens often (itching). Thanks

## 2014-04-23 ENCOUNTER — Other Ambulatory Visit: Payer: Medicaid Other

## 2014-04-23 DIAGNOSIS — B20 Human immunodeficiency virus [HIV] disease: Secondary | ICD-10-CM

## 2014-04-23 LAB — CBC WITH DIFFERENTIAL/PLATELET
Basophils Absolute: 0 10*3/uL (ref 0.0–0.1)
Basophils Relative: 0 % (ref 0–1)
EOS ABS: 0.1 10*3/uL (ref 0.0–0.7)
Eosinophils Relative: 2 % (ref 0–5)
HCT: 37.8 % (ref 36.0–46.0)
Hemoglobin: 12.5 g/dL (ref 12.0–15.0)
LYMPHS PCT: 38 % (ref 12–46)
Lymphs Abs: 2.2 10*3/uL (ref 0.7–4.0)
MCH: 26.9 pg (ref 26.0–34.0)
MCHC: 33.1 g/dL (ref 30.0–36.0)
MCV: 81.3 fL (ref 78.0–100.0)
MONOS PCT: 6 % (ref 3–12)
Monocytes Absolute: 0.3 10*3/uL (ref 0.1–1.0)
NEUTROS ABS: 3.1 10*3/uL (ref 1.7–7.7)
NEUTROS PCT: 54 % (ref 43–77)
PLATELETS: 174 10*3/uL (ref 150–400)
RBC: 4.65 MIL/uL (ref 3.87–5.11)
RDW: 14.8 % (ref 11.5–15.5)
WBC: 5.8 10*3/uL (ref 4.0–10.5)

## 2014-04-23 LAB — COMPLETE METABOLIC PANEL WITH GFR
ALT: 13 U/L (ref 0–35)
AST: 17 U/L (ref 0–37)
Albumin: 4 g/dL (ref 3.5–5.2)
Alkaline Phosphatase: 66 U/L (ref 39–117)
BILIRUBIN TOTAL: 0.4 mg/dL (ref 0.2–1.2)
BUN: 9 mg/dL (ref 6–23)
CALCIUM: 8.8 mg/dL (ref 8.4–10.5)
CO2: 24 mEq/L (ref 19–32)
CREATININE: 0.78 mg/dL (ref 0.50–1.10)
Chloride: 102 mEq/L (ref 96–112)
GFR, Est African American: >89 mL/min
Glucose, Bld: 112 mg/dL — ABNORMAL HIGH (ref 70–99)
Potassium: 3.7 mEq/L (ref 3.5–5.3)
Sodium: 139 mEq/L (ref 135–145)
Total Protein: 6.6 g/dL (ref 6.0–8.3)

## 2014-04-24 LAB — HIV-1 RNA QUANT-NO REFLEX-BLD: HIV-1 RNA Quant, Log: <1.3 {Log} (ref <1.30)

## 2014-04-24 LAB — T-HELPER CELL (CD4) - (RCID CLINIC ONLY)
CD4 T CELL HELPER: 37 % (ref 33–55)
CD4 T Cell Abs: 810 /uL (ref 400–2700)

## 2014-04-28 ENCOUNTER — Ambulatory Visit: Payer: Medicaid Other | Admitting: Infectious Disease

## 2014-04-29 LAB — HLA B*5701: HLA-B 5701 W/RFLX HLA-B HIGH: NEGATIVE

## 2014-04-30 ENCOUNTER — Ambulatory Visit: Payer: Medicaid Other | Admitting: Sports Medicine

## 2014-05-02 ENCOUNTER — Ambulatory Visit: Payer: Medicaid Other | Admitting: Infectious Disease

## 2014-05-05 ENCOUNTER — Ambulatory Visit: Payer: Medicaid Other | Admitting: Obstetrics & Gynecology

## 2014-05-06 ENCOUNTER — Encounter (INDEPENDENT_AMBULATORY_CARE_PROVIDER_SITE_OTHER): Payer: Self-pay

## 2014-05-06 ENCOUNTER — Ambulatory Visit
Admission: RE | Admit: 2014-05-06 | Discharge: 2014-05-06 | Disposition: A | Discharge status: Home / Self Care | Payer: Medicaid Other | Source: Ambulatory Visit

## 2014-05-06 DIAGNOSIS — Z1231 Encounter for screening mammogram for malignant neoplasm of breast: Secondary | ICD-10-CM

## 2014-05-06 NOTE — Telephone Encounter (Signed)
No further calls about site of injection.

## 2014-05-07 ENCOUNTER — Ambulatory Visit: Payer: Medicaid Other | Admitting: Infectious Disease

## 2014-05-09 ENCOUNTER — Other Ambulatory Visit: Payer: Self-pay | Admitting: *Deleted

## 2014-05-09 DIAGNOSIS — B20 Human immunodeficiency virus [HIV] disease: Secondary | ICD-10-CM

## 2014-05-14 ENCOUNTER — Ambulatory Visit (INDEPENDENT_AMBULATORY_CARE_PROVIDER_SITE_OTHER): Payer: Medicaid Other | Admitting: Neurology

## 2014-05-14 DIAGNOSIS — G43709 Chronic migraine without aura, not intractable, without status migrainosus: Secondary | ICD-10-CM

## 2014-05-14 DIAGNOSIS — IMO0002 Reserved for concepts with insufficient information to code with codable children: Secondary | ICD-10-CM

## 2014-05-14 DIAGNOSIS — G43719 Chronic migraine without aura, intractable, without status migrainosus: Secondary | ICD-10-CM

## 2014-05-14 MED ORDER — PROPRANOLOL HCL 40 MG PO TABS: 40.0000 mg | ORAL_TABLET | Freq: Three times a day (TID) | ORAL | Status: DC

## 2014-05-14 MED ORDER — ZOLMITRIPTAN 5 MG PO TABS: 5.0000 mg | ORAL_TABLET | ORAL | Status: DC | PRN

## 2014-05-14 NOTE — Progress Notes (Signed)
HPI:   [name] is a 48 year old Caucasian female with past medical history of HIV, high cholesterol, migraines, depression, and non-Hodgkin lymphoma in 2005 with chemotherapy and radiation.   She has history of migraines since 19. In the last 10 years, she reports having a migraines daily. She is currently taking Topamax and Zomig with relief. She takes approximately 20-25 tablets of her Zomig per month.    She has aura's that look like floating dirt particles, flashing lights, and she sees Christmas tree lights when they are severe, she has nausea, vomiting and sometimes diarrhea when they're severe. She also complains of diplopia, blurry vision, and  photophobia. She also has had a lightning bolt sensation throughout her body.  She describes them as pulsating and throbbing  on a scale of 5-6/10 which she considers mild, when they are severe they can go as high as 10/10. They usually start in the right temporal area, but will also start on the left temporal and are worse.   Any type of smoke or MSG can precipitate a migraine. They generally last 2-9 days. She has been to the emergency department twice in the last year for migraines. She states "I just can't do this anymore I need to do something about her migraines"  She has tried in the past Maxalt, Imitrex, atenolol, verapamil, nortriptyline, Inderal, Depakote without benefit. Atenolol and verapamil caused hypotension. Depakote caused severe vomiting. Topamax causing her worsening headaches, I also put her on Zonegran, she denies significant improvement.  I started to give her BOTOX injection since May 2013, she responded very well,   Previous MRI brain was normal in 2013.  UPDATE Nov 11th 2015:    Last injection was in August 2015, she responded very well, there was no significant side effect noticed. But it took longer to take effect and benefit last shorter, only about 2 months, She noticed increased headache over few weeks,  couple times a  week, Zomig was helpful,  Physical Exam  General: Well groomed and obese female Neck: supple no carotid bruits Respiratory: clear to auscultation bilaterally Cardiovascular: regular rate rhythm She wear right knee brace  Neurologic Exam  Mental Status: obese, pleasant, awake, alert, cooperative to history, talking, and casual conversation. Cranial Nerves: CN II-XII pupils were equal round reactive to light. Extraocular movements were full.  Visual fields were full on confrontational test.  Facial sensation and strength were normal.   Uvula tongue were midline.  Head turning and shoulder shrugging were normal and symmetric.  Tongue protrusion into the cheeks strength were normal.  Motor: Normal tone, bulk, and strength. Sensory: Normal to light touch, pinprick, proprioception except decreased vibratory sensation to her right lower extremity she relates to peripheral neuropathy.  Decreased vibratory sensation to left upper extremity.   Coordination: There was no dysmetria noticed. Gait and Station: Narrow based and steady,  Reflexes: Deep tendon reflexes: present and symmetric.  Plantar responses are flexor.   Assessment and plan: 48 year-old Caucasian female with a history of HIV, hypercholesterolemia, Non hodgkin' lymphoma with chemotherapy and radiation and migraines since age 69.   She has tried and failed multiple preventive medications including atenolol, verapamil Maxalt, Imitrex, nortriptyline, and Depakote,Topamax,   She has migraines daily for more than  10 years. She is currently taking  zonogram  and Zomig with some relief.   Normal MRI brain, and neurological exam.  BOTOX injection was performed according to protocol by [employer]. 100 units of BOTOX was dissolved into 2 cc NS.  Total of 200 units  Corrugator 2 sites, 10 units Procerus 1 site, 5 unit Frontalis 4 sites,  20 units, Temporalis 8 sites,  40 units  Occipitalis 6 sites, 30 units Cervical Paraspinal, 4 sites, 20  units Trapezius, 6 sites, 30 units  Extra 45 units to cervical paraspinals  Patient tolerate the injection well. Will return for repeat injection in 3 months. I have started her on Inderal 40 mg twice a day, refill zomig today

## 2014-05-21 ENCOUNTER — Ambulatory Visit: Payer: Medicaid Other | Admitting: Infectious Disease

## 2014-06-11 ENCOUNTER — Ambulatory Visit
Admission: RE | Admit: 2014-06-11 | Discharge: 2014-06-11 | Disposition: A | Discharge status: Home / Self Care | Payer: Medicaid Other | Source: Ambulatory Visit | Attending: Sports Medicine | Admitting: Sports Medicine

## 2014-06-11 ENCOUNTER — Encounter: Payer: Self-pay | Admitting: Sports Medicine

## 2014-06-11 ENCOUNTER — Ambulatory Visit (INDEPENDENT_AMBULATORY_CARE_PROVIDER_SITE_OTHER): Payer: Medicaid Other | Admitting: Sports Medicine

## 2014-06-11 VITALS — BP 133/79 | HR 93 | Ht 66.0 in | Wt 265.0 lb

## 2014-06-11 DIAGNOSIS — M25552 Pain in left hip: Secondary | ICD-10-CM

## 2014-06-11 DIAGNOSIS — M2550 Pain in unspecified joint: Secondary | ICD-10-CM | POA: Insufficient documentation

## 2014-06-11 DIAGNOSIS — F419 Anxiety disorder, unspecified: Secondary | ICD-10-CM

## 2014-06-11 MED ORDER — DICLOFENAC SODIUM 1 % TD GEL: 2.0000 g | Freq: Two times a day (BID) | TRANSDERMAL | Status: DC

## 2014-06-11 NOTE — Assessment & Plan Note (Signed)
This worsens her mSK issues  I suggested cont to work with her PCP to control this  Zoloft helps

## 2014-06-11 NOTE — Assessment & Plan Note (Signed)
I added a compression wrap for RT wrist Use ofr 6 weeks for lifting or carrying  Xrays of Hip show mild early DJD I suggested HEP for hips and try to return to biking at gym  WT loss is critical because with her HM she probably subluxes joints at hip and knee/  Rt knee already had surgery  She will return in 1 mo

## 2014-06-11 NOTE — Progress Notes (Signed)
Patient ID: [name], female   DOB: 05/04/1966, 48 y.o.   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  Comes for RT wrist pain No specific injury but hurts with lifting and activity Tighter than Left  Lt hip pain C/o of popping and swelling into upper thigh Again no specific injury Long hx of popping at times  Hx of DDD lumbar spine but no radiating sxs now  Long standing morbid obesity  PExam NAD BP 133/79 mmHg  Pulse 93  Ht 5\' 6"  (1.676 m)  Wt 265 lb (120.203 kg)  BMI 42.79 kg/m2  RT wrist shows some limitation of extension and swelling along dorsum Note ROM is normal but less than left  Hypermobility screen + with excess ROM several joints  Left hip popping on rotation and on ER with abduction ROM is 100 deg rotation on RT with 90 deg left Strength is average to flexion / abduction/ rotation  Arches pronated bilat  Walking gait with no trendelenburg  XRay of hip ; Mild degenerative change Lucency in femur - this may be artifact and plan repeat film  [place] of RT wrist shows mild fluid in extensor CPT 4

## 2014-06-11 NOTE — Patient Instructions (Signed)
Once you can get back to the gym  Cycling is a good exercise Also isometrics in each direction for hip - hold for count of 5 x 5  Use the wrist wrap during day Do ball squeezes in warm water 10 squeezes hold for count of 5  Use wrist splint at night  We'll Xray hip to be sure OK  Recheck in January

## 2014-06-12 ENCOUNTER — Ambulatory Visit (INDEPENDENT_AMBULATORY_CARE_PROVIDER_SITE_OTHER): Payer: Medicaid Other | Admitting: Obstetrics & Gynecology

## 2014-06-12 ENCOUNTER — Encounter: Payer: Self-pay | Admitting: Obstetrics & Gynecology

## 2014-06-12 VITALS — BP 129/76 | HR 70 | Temp 98.0°F

## 2014-06-12 DIAGNOSIS — N951 Menopausal and female climacteric states: Secondary | ICD-10-CM

## 2014-06-12 DIAGNOSIS — Z01419 Encounter for gynecological examination (general) (routine) without abnormal findings: Secondary | ICD-10-CM

## 2014-06-12 DIAGNOSIS — Z0000 Encounter for general adult medical examination without abnormal findings: Secondary | ICD-10-CM

## 2014-06-12 MED ORDER — NORETHINDRONE 0.35 MG PO TABS: 1.0000 | ORAL_TABLET | Freq: Every day | ORAL | Status: DC

## 2014-06-12 NOTE — Patient Instructions (Signed)

## 2014-06-12 NOTE — Progress Notes (Signed)
Subjective:     [name] is a 48 y.o. female here for a routine exam.       Personal health questionnaire:  Is patient [name], have a family history of breast and/or ovarian cancer: yes Is there a family history of uterine cancer diagnosed at age < 68, gastrointestinal cancer, urinary tract cancer, family member who is a Field seismologist syndrome-associated carrier: no Is the patient overweight and hypertensive, family history of diabetes, personal history of gestational diabetes or PCOS: yes Is patient over 28, have PCOS,  family history of premature CHD under age 39, diabetes, smoke, have hypertension or peripheral artery disease:  no At any time, has a partner hit, kicked or otherwise hurt or frightened you?: no Over the past 2 weeks, have you felt down, depressed or hopeless?: yes Over the past 2 weeks, have you felt little interest or pleasure in doing things?:not asked   Gynecologic History No LMP recorded. Patient has had an ablation. Contraception: condoms, BTL Last Pap results were: normal Last mammogram: 11/15. Results were: normal  Obstetric History OB History  No data available    Past Medical History  Diagnosis Date  . Anemia 10/2009    macrocytic anemia with baseline MCV 104-106  . PUD (peptic ulcer disease) 07/1997    per EGD report 07/1997 with history of esophagitis  . Migraines     on topamax and triptans, frequent (almost daily) attacks   . Hyperlipidemia   . HIV infection     CD4 = 570 (05/2010), VL undetectation  . Anxiety   . Spondylolisthesis of lumbar region     L5-S1  . Gastroparesis   . Shingles     in lumbar dermatome  . Hypertension     no meds  . Non Hodgkin's lymphoma     stage II, s/p resection, chemotherapy, radiotherapy, Dr. Beryle Beams is her oncologist.   . Asthma   . DVT (deep venous thrombosis)     history clot lt groin when she had lymphoma-in remision now.  . Traumatic tear of lateral meniscus of right knee 04/20/2012  .  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 Hiatal hernia 01/01/2002  . Acute gastritis without mention of hemorrhage 01/01/2002  . Duodenitis without mention of hemorrhage 01/01/2002  . GERD (gastroesophageal reflux disease)   . IBS (irritable bowel syndrome)     Past Surgical History  Procedure Laterality Date  . Upper gastrointestinal endoscopy  2003, 2007    done by Dr. Velora Heckler  . Tonsillectomy    . Endometrial ablation    . Tympanostomy tube placement      as child-both  . Tympanoplasty Left   . Cholecystectomy    . Knee arthroscopy Right 04/20/12    Current outpatient prescriptions: albuterol (PROAIR HFA) 108 (90 BASE) MCG/ACT inhaler, use up to 2 puffs  every 4 hours as need for short of breath/ wheezing, Disp: 8.5 g, Rfl: 0;  botulinum toxin Type A (BOTOX) 100 UNITS SOLR injection, INJECT 200 UNITS IM INTO  HEAD NECK EVERY 3 MONTHS, Disp: , Rfl: ;  Calcium Carbonate-Vitamin D (CALCIUM-VITAMIN D) 500-200 MG-UNIT per tablet, Take 2 tablets by mouth daily. , Disp: , Rfl:  cetirizine (ZYRTEC) 10 MG tablet, Take 1 tablet (10 mg total) by mouth daily., Disp: 30 tablet, Rfl: 11;  Darunavir-Cobicistat (PREZCOBIX) 800-150 MG TABS, Take 1 tablet by mouth daily., Disp: 30 tablet, Rfl: 11;  diclofenac sodium (VOLTAREN) 1 % GEL, Apply 2 g topically 2 (two) times daily. APPLY  TO AFFECTED AREA TWICE A DAY FOR PAIN, Disp: 2 Tube, Rfl: 2 emtricitabine-tenofovir (TRUVADA) 200-300 MG per tablet, TAKE 1 TABLET BY MOUTH EVERY DAY, Disp: , Rfl: ;  esomeprazole (NEXIUM) 40 MG capsule, Take 1 capsule (40 mg total) by mouth 2 (two) times daily before a meal., Disp: 60 capsule, Rfl: 11;  FLUoxetine (PROZAC) 20 MG capsule, Take 2 capsules (40 mg total) by mouth daily., Disp: 60 capsule, Rfl: 3 mometasone-formoterol (DULERA) 100-5 MCG/ACT AERO, Take 2 puffs first thing in am and then another 2 puffs about 12 hours later., Disp: 1 Inhaler, Rfl: 11;  [START ON 06/23/2014] oxyCODONE-acetaminophen (PERCOCET/ROXICET) 5-325 MG  per tablet, Take 1 tablet by mouth every 6 (six) hours as needed for severe pain., Disp: 120 tablet, Rfl: 0 promethazine (PHENERGAN) 50 MG tablet, TAKE 1/2 TO 1 TABLET EVERY 6 HOURS AS NEEDED FOR NAUSEA UP TO 4 TIMESA DAY, Disp: 90 tablet, Rfl: 1;  propranolol (INDERAL) 40 MG tablet, Take 1 tablet (40 mg total) by mouth 3 (three) times daily. (Patient taking differently: Take 40 mg by mouth 3 (three) times daily. ), Disp: 60 tablet, Rfl: 11 scopolamine (TRANSDERM-SCOP) 1 MG/3DAYS, Place 1 patch (1.5 mg total) onto the skin every other day as needed., Disp: 15 patch, Rfl: 6;  Tamsulosin HCl (FLOMAX) 0.4 MG CAPS, Take 0.4 mg by mouth daily. 1-2 daily, Disp: , Rfl: ;  zolmitriptan (ZOMIG) 5 MG tablet, Take 1 tablet (5 mg total) by mouth as needed for migraine., Disp: 15 tablet, Rfl: 11 fluconazole (DIFLUCAN) 100 MG tablet, TAKE 1 TABLET BY MOUTH EVERY DAY OR AS DIRECTED (Patient not taking: Reported on 06/12/2014), Disp: 10 tablet, Rfl: 0;  LORazepam (ATIVAN) 0.5 MG tablet, Take 1 tablet (0.5 mg total) by mouth daily as needed for anxiety., Disp: 30 tablet, Rfl: 1;  norethindrone (MICRONOR,CAMILA,ERRIN) 0.35 MG tablet, Take 1 tablet (0.35 mg total) by mouth daily., Disp: 1 Package, Rfl: 11 predniSONE (DELTASONE) 20 MG tablet, Take 1 with breakfast and 1 with lunch for 4 days., Disp: , Rfl: ;  terconazole (TERAZOL 3) 0.8 % vaginal cream, Place 1 applicator vaginally at bedtime. (Patient not taking: Reported on 06/12/2014), Disp: 20 g, Rfl: 0;  TRUVADA 200-300 MG per tablet, TAKE 1 TABLET BY MOUTH EVERY DAY, Disp: 30 tablet, Rfl: 11;  VOLTAREN 1 % GEL, APPLY TO AFFECTED AREA TWICE A DAY FOR PAIN, Disp: 100 g, Rfl: 1 Allergies  Allergen Reactions  . Diazepam     Triggers migraines  . Divalproex Sodium   . Gabapentin Nausea And Vomiting  . Ondansetron Hcl     Triggers migraines, "makes me vomit"  . Penicillins     REACTION: lips swollen to point of bleeding, sever vaginal irritation  . Propoxyphene  N-Acetaminophen Nausea And Vomiting  . Simvastatin     PATIENT CANNOT BE PRESCRIBED THIS STATIN WHILE RECEIVING NORVIR  . Tramadol Hcl     REACTION: migraines and itching--hx of migraines even without medication   . Zocor [Simvastatin - High Dose]     History  Substance Use Topics  . Smoking status: Never Smoker   . Smokeless tobacco: Never Used  . Alcohol Use: No    Family History  Problem Relation Age of Onset  . Hypertension Mother   . Stroke Mother   . Breast cancer Paternal Grandmother   . Kidney cancer Maternal Uncle   . Diabetes Maternal Uncle   . Diabetes Maternal Grandmother   . Emphysema Paternal Uncle     never smoker  .  Emphysema Paternal Grandfather     never smoker  . Colon cancer Paternal Aunt     with met to lung   . Non-Hodgkin's lymphoma Maternal Uncle       Review of Systems  Constitutional: negative for fatigue and weight loss Respiratory: negative for cough and wheezing Cardiovascular: negative for chest pain, fatigue and palpitations Gastrointestinal: negative for abdominal pain and change in bowel habits Musculoskeletal:negative for myalgias Neurological: negative for gait problems and tremors Behavioral/Psych: negative for abusive relationship, depression Endocrine: negative for temperature intolerance   Genitourinary:negative for abnormal menstrual periods, genital lesions; positive for hot flashes and vaginal discharge Integument/breast: negative for breast lump, breast tenderness, nipple discharge and skin lesion(s)    Objective:       BP 129/76 mmHg  Pulse 70  Temp(Src) 98 F (36.7 C) General:   alert  Skin:   no rash or abnormalities  Lungs:   clear to auscultation bilaterally  Heart:   regular rate and rhythm, S1, S2 normal, no murmur, click, rub or gallop  Breasts:   normal without suspicious masses, skin or nipple changes or axillary nodes  Abdomen:  normal findings: no organomegaly, soft, non-tender and no hernia  Pelvis:   External genitalia: normal general appearance Urinary system: urethral meatus normal and bladder without fullness, nontender Vaginal: normal without tenderness, induration or masses Cervix: normal appearance Adnexa: normal bimanual exam Uterus: anteverted and non-tender, normal size   Lab Review  Labs reviewed no Radiologic studies reviewed no    Assessment:    Healthy female exam.   Perimenopausal Plan:    Education reviewed: calcium supplements, low fat, low cholesterol diet, safe sex/STD prevention and weight bearing exercise.   Meds ordered this encounter  Medications  . norethindrone (MICRONOR,CAMILA,ERRIN) 0.35 MG tablet    Sig: Take 1 tablet (0.35 mg total) by mouth daily.    Dispense:  1 Package    Refill:  11   Orders Placed This Encounter  Procedures  . SureSwab, Vaginosis/Vaginitis Plus  . Urine culture  . Urinalysis, Routine w reflex microscopic  . Follicle stimulating hormone    Possible management options include: alternative SSRI w/efficacy for treatment of hot flushes, weight loss Follow up as needed.

## 2014-06-13 ENCOUNTER — Other Ambulatory Visit: Payer: Self-pay | Admitting: Infectious Disease

## 2014-06-13 ENCOUNTER — Ambulatory Visit
Admission: RE | Admit: 2014-06-13 | Discharge: 2014-06-13 | Disposition: A | Discharge status: Home / Self Care | Payer: Medicaid Other | Source: Ambulatory Visit | Attending: Sports Medicine | Admitting: Sports Medicine

## 2014-06-13 ENCOUNTER — Other Ambulatory Visit: Payer: Self-pay | Admitting: *Deleted

## 2014-06-13 ENCOUNTER — Telehealth: Payer: Self-pay | Admitting: Licensed Clinical Social Worker

## 2014-06-13 DIAGNOSIS — M25552 Pain in left hip: Secondary | ICD-10-CM

## 2014-06-13 DIAGNOSIS — R110 Nausea: Secondary | ICD-10-CM

## 2014-06-13 DIAGNOSIS — N951 Menopausal and female climacteric states: Secondary | ICD-10-CM | POA: Insufficient documentation

## 2014-06-13 LAB — URINALYSIS, ROUTINE W REFLEX MICROSCOPIC
Bilirubin Urine: NEGATIVE
Glucose, UA: NEGATIVE mg/dL
Hgb urine dipstick: NEGATIVE
Ketones, ur: NEGATIVE mg/dL
Leukocytes, UA: NEGATIVE
Nitrite: NEGATIVE
PH: 6 (ref 5.0–8.0)
PROTEIN: NEGATIVE mg/dL
Specific Gravity, Urine: 1.008 (ref 1.005–1.030)
UROBILINOGEN UA: 0.2 mg/dL (ref 0.0–1.0)

## 2014-06-13 LAB — FOLLICLE STIMULATING HORMONE: FSH: 48.7 m[IU]/mL

## 2014-06-13 LAB — URINE CULTURE
COLONY COUNT: NO GROWTH
ORGANISM ID, BACTERIA: NO GROWTH

## 2014-06-13 MED ORDER — LORAZEPAM 0.5 MG PO TABS: 0.5000 mg | ORAL_TABLET | Freq: Every day | ORAL | Status: DC | PRN

## 2014-06-13 NOTE — Telephone Encounter (Signed)
Rx called in to pharmacy. 

## 2014-06-13 NOTE — Telephone Encounter (Signed)
Patient requesting phenergan and has been getting it for awhile, just wanting to see if it is ok to fill.

## 2014-06-13 NOTE — Telephone Encounter (Signed)
That is fine thanks Tamika!

## 2014-06-16 LAB — PAP IG AND HPV HIGH-RISK: HPV DNA High Risk: NOT DETECTED

## 2014-06-17 LAB — SURESWAB, VAGINOSIS/VAGINITIS PLUS
Atopobium vaginae: NOT DETECTED Log (cells/mL)
C. TROPICALIS, DNA: NOT DETECTED
C. albicans, DNA: NOT DETECTED
C. glabrata, DNA: DETECTED — AB
C. parapsilosis, DNA: NOT DETECTED
C. trachomatis RNA, TMA: NOT DETECTED
Gardnerella vaginalis: NOT DETECTED Log (cells/mL)
LACTOBACILLUS SPECIES: 6.7 Log (cells/mL)
MEGASPHAERA SPECIES: NOT DETECTED Log (cells/mL)
N. GONORRHOEAE RNA, TMA: NOT DETECTED
T. vaginalis RNA, QL TMA: NOT DETECTED

## 2014-06-30 ENCOUNTER — Encounter: Payer: Self-pay | Admitting: *Deleted

## 2014-07-01 ENCOUNTER — Encounter: Payer: Self-pay | Admitting: Obstetrics & Gynecology

## 2014-07-22 ENCOUNTER — Other Ambulatory Visit: Payer: Self-pay | Admitting: *Deleted

## 2014-07-22 DIAGNOSIS — G894 Chronic pain syndrome: Secondary | ICD-10-CM

## 2014-07-22 NOTE — Telephone Encounter (Signed)
Could she have 3?

## 2014-07-23 ENCOUNTER — Other Ambulatory Visit: Payer: Self-pay | Admitting: Internal Medicine

## 2014-07-23 ENCOUNTER — Ambulatory Visit (INDEPENDENT_AMBULATORY_CARE_PROVIDER_SITE_OTHER): Payer: Medicaid Other | Admitting: Sports Medicine

## 2014-07-23 ENCOUNTER — Encounter: Payer: Self-pay | Admitting: Sports Medicine

## 2014-07-23 VITALS — BP 115/80 | HR 86 | Ht 66.0 in | Wt 265.0 lb

## 2014-07-23 DIAGNOSIS — M2550 Pain in unspecified joint: Secondary | ICD-10-CM

## 2014-07-23 DIAGNOSIS — G894 Chronic pain syndrome: Secondary | ICD-10-CM

## 2014-07-23 MED ORDER — OXYCODONE-ACETAMINOPHEN 5-325 MG PO TABS: 1.0000 | ORAL_TABLET | Freq: Four times a day (QID) | ORAL | Status: DC | PRN

## 2014-07-23 MED ORDER — DICLOFENAC SODIUM 1 % TD GEL: 2.0000 g | Freq: Two times a day (BID) | TRANSDERMAL | Status: DC

## 2014-07-23 NOTE — Assessment & Plan Note (Addendum)
Left Hip with subluxation and external snapping. 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 XR with mild OA. -Further modify exercises to more isometric exercise, seated in a chair, pressing against a wall with foot. -stationary bike -Compression shorts to try to splint hips.  -Follow-up in 2-3 months or sooner prn

## 2014-07-23 NOTE — Telephone Encounter (Signed)
notified

## 2014-07-23 NOTE — Progress Notes (Signed)
[name] is a 49 year old female with a complex past medical history who presents for follow-up of right wrist and left hip pain. 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 Her right wrist pain has improved since we last saw her with wearing the compression sleeve and using topical Voltaren. She still notices some limited motion with extension, but says that her swelling has significantly improved. As far as the left hip is concerned, she still has frequent painful snapping located laterally. She would like to get back to the gym where she works out, she has done significantly better when she is able to get into the gym in the past. She will frequently noticed that her left lateral hip will snap and create significant pain. She also notices sensation of left hip subluxation. She frequently has to pad pillows around her when she is sleeping due to pain. Symptoms are aggravated if she lays on that side as well. X-rays were performed of the left hip following her last visit which showed mild osteoarthritis. She requests a refill of her Voltaren gel for one year supply.  Past medical history, social history, medications, and allergies were reviewed and are up to date in the chart. 7 point review of systems was performed and was otherwise negative unless noted in the history of present illness.  Exam BP 115/80 mmHg  Pulse 86  Ht 5\' 6"  (1.676 m)  Wt 265 lb (120.203 kg)  BMI 42.79 kg/m2 GEN: The patient is well-developed well-nourished female and in no acute distress.  She is awake alert and oriented x3. SKIN: warm and well-perfused, no rash  EXTR: No lower extremity edema or calf tenderness Neuro: Strength 5/5 globally. Sensation intact throughout. No focal deficits. Vasc: +2 bilateral distal pulses. No edema.  MSK: As previously, her hypermobility screen is positive with excess range of motion in several joints. Examination of the right wrist reveals 5-10 of extension  lacking compared to the left. No significant swelling, erythema, or induration. Strength intact. Examination of the left hip reveals minimal pain with internal and external rotation, however Corky Sox and Fadir testing re-creates snapping and pain located in the lateral hip over the iliotibial/greater trochanteric complex. Thigh thrust is negative for subluxation. Weakness of hip abductors bilaterally.

## 2014-07-23 NOTE — Telephone Encounter (Signed)
She has an appt with me on 2/3. I will just give her one month for now, and she can get 3 months when she comes for her appt.   Thanks!  Dr q

## 2014-07-23 NOTE — Patient Instructions (Addendum)
-  Modify home exercises as follows -Add the following to your routine at the gym:  1. Seated in a chair with a small ball between your knees. Squeeze the ball with your knees, hold for 5-10 seconds. Repeat 3-5 times, 3 times weekly.  2. Seated in a chair, pull your knees to the left, grab right knee with your left hand, push your right knee to the right against your own resistance, hold for 5-10 seconds. Repeat 3-5 times, 3 times weekly.  3. Standing against a wall with the wall to your left side, press left foot to your left side into the wall, hold for 5-10 seconds. Repeat 3-5 times, 3 times weekly. -Wear compression shorts if active. -Use compression sleeve for right wrist and left arm if active or for a couple hours a day.

## 2014-07-29 ENCOUNTER — Ambulatory Visit: Payer: Medicaid Other | Admitting: Infectious Disease

## 2014-08-04 ENCOUNTER — Telehealth: Payer: Self-pay | Admitting: *Deleted

## 2014-08-04 NOTE — Telephone Encounter (Signed)
Pharmacy was called for refill on Boric acid vaginal caps, 600mg .  Rx message was left for phamacy.

## 2014-08-05 ENCOUNTER — Telehealth: Payer: Self-pay | Admitting: Internal Medicine

## 2014-08-05 ENCOUNTER — Ambulatory Visit: Payer: Medicaid Other | Admitting: Internal Medicine

## 2014-08-05 NOTE — Telephone Encounter (Signed)
Patient Confirmed

## 2014-08-05 NOTE — Telephone Encounter (Signed)
Call to patient to confirm appointment for 08/06/14 at 10:45.LMTCB

## 2014-08-06 ENCOUNTER — Ambulatory Visit: Payer: Medicaid Other | Admitting: Internal Medicine

## 2014-08-07 ENCOUNTER — Telehealth: Payer: Self-pay | Admitting: Internal Medicine

## 2014-08-07 NOTE — Telephone Encounter (Signed)
Call to patient to confirm appointment for 08/08/14 at 10:45, [facility]

## 2014-08-08 ENCOUNTER — Ambulatory Visit (INDEPENDENT_AMBULATORY_CARE_PROVIDER_SITE_OTHER): Payer: Medicaid Other | Admitting: Internal Medicine

## 2014-08-08 ENCOUNTER — Encounter: Payer: Self-pay | Admitting: Internal Medicine

## 2014-08-08 VITALS — BP 132/74 | HR 85 | Temp 98.1°F | Wt 265.4 lb

## 2014-08-08 DIAGNOSIS — J329 Chronic sinusitis, unspecified: Secondary | ICD-10-CM

## 2014-08-08 DIAGNOSIS — G894 Chronic pain syndrome: Secondary | ICD-10-CM

## 2014-08-08 DIAGNOSIS — F419 Anxiety disorder, unspecified: Secondary | ICD-10-CM

## 2014-08-08 DIAGNOSIS — B9689 Other specified bacterial agents as the cause of diseases classified elsewhere: Secondary | ICD-10-CM

## 2014-08-08 MED ORDER — LORAZEPAM 0.5 MG PO TABS: 0.5000 mg | ORAL_TABLET | Freq: Every day | ORAL | Status: DC | PRN

## 2014-08-08 MED ORDER — OXYCODONE-ACETAMINOPHEN 5-325 MG PO TABS: 1.0000 | ORAL_TABLET | Freq: Four times a day (QID) | ORAL | Status: DC | PRN

## 2014-08-08 MED ORDER — AMOXICILLIN-POT CLAVULANATE 875-125 MG PO TABS: 1.0000 | ORAL_TABLET | Freq: Two times a day (BID) | ORAL | Status: DC

## 2014-08-08 NOTE — Progress Notes (Signed)
Lorazepam rx called to [facility].

## 2014-08-08 NOTE — Patient Instructions (Signed)
General Instructions:  Please bring your medicines with you each time you come to clinic.  Medicines may include prescription medications, over-the-counter medications, herbal remedies, eye drops, vitamins, or other pills.  Please try getting a vaporizer at home to help with your sinuses  Start augmentin twice a day for 7 days for your sinus infection, if symptoms persist or get worse call and let us know. If you have any signs of allergic reaction, stop medicine immediately and call me! Or go directly to ED. Any signs of rash, shortness of breath or lip swelling or other allergic symptoms  Follow up with your other specialist appointments as planned  Work on weight loss, great job on getting back at the gym  Progress Toward Treatment Goals:  Treatment Goal 08/06/2013  Prevent falls at goal    [place]:  Self Care Goal 08/06/2013  Manage my medications take my medicines as prescribed    No flowsheet data found.   Care Management & Community Referrals:  No flowsheet data found.   Amoxicillin; Clavulanic Acid chewable tablets What is this medicine? AMOXICILLIN; CLAVULANIC ACID (a mox i SIL in; KLAV yoo lan ic AS id) is a penicillin antibiotic. It is used to treat certain kinds of bacterial infections. It It will not work for colds, flu, or other viral infections. This medicine may be used for other purposes; ask your health care provider or pharmacist if you have questions. COMMON BRAND NAME(S): Augmentin What should I tell my health care provider before I take this medicine? They need to know if you have any of these conditions: -bowel disease, like colitis -kidney disease -liver disease -mononucleosis -phenylketonuria -an unusual or allergic reaction to amoxicillin, penicillin, cephalosporin, other antibiotics, clavulanic acid, other medicines, foods, dyes, or preservatives -pregnant or trying to get pregnant -breast-feeding How should I use this  medicine? Take this medicine by mouth. Chew it completely before swallowing. Follow the directions on the prescription label. Take this medicine at the start of a meal or snack. Take your medicine at regular intervals. Do not take your medicine more often than directed. Take all of your medicine as directed even if you think you are better. Do not skip doses or stop your medicine early. Talk to your pediatrician regarding the use of this medicine in children. While this drug may be prescribed for selected conditions, precautions do apply. Overdosage: If you think you have taken too much of this medicine contact a poison control center or emergency room at once. NOTE: This medicine is only for you. Do not share this medicine with others. What if I miss a dose? If you miss a dose, take it as soon as you can. If it is almost time for your next dose, take only that dose. Do not take double or extra doses. What may interact with this medicine? -allopurinol -anticoagulants -birth control pills -methotrexate -probenecid This list may not describe all possible interactions. Give your health care provider a list of all the medicines, herbs, non-prescription drugs, or dietary supplements you use. Also tell them if you smoke, drink alcohol, or use illegal drugs. Some items may interact with your medicine. What should I watch for while using this medicine? Tell your doctor or health care professional if your symptoms do not improve. Do not treat diarrhea with over the counter products. Contact your doctor if you have diarrhea that lasts more than 2 days or if it is severe and watery. If you have diabetes, you may  get a false-positive result for sugar in your urine. Check with your doctor or health care professional. Birth control pills may not work properly while you are taking this medicine. Talk to your doctor about using an extra method of birth control. What side effects may I notice from receiving this  medicine? Side effects that you should report to your doctor or health care professional as soon as possible: -allergic reactions like skin rash, itching or hives, swelling of the face, lips, or tongue -breathing problems -dark urine -fever or chills, sore throat -redness, blistering, peeling or loosening of the skin, including inside the mouth -seizures -trouble passing urine or change in the amount of urine -unusual bleeding, bruising -unusually weak or tired -white patches or sores in the mouth or throat Side effects that usually do not require medical attention (report to your doctor or health care professional if they continue or are bothersome): -diarrhea -dizziness -headache -nausea, vomiting -stomach upset -vaginal or anal irritation This list may not describe all possible side effects. Call your doctor for medical advice about side effects. You may report side effects to FDA at 1-800-FDA-1088. Where should I keep my medicine? Keep out of the reach of children. Store at room temperature below 25 degrees C (77 degrees F). Keep container tightly closed. Throw away any unused medicine after the expiration date. NOTE: This sheet is a summary. It may not cover all possible information. If you have questions about this medicine, talk to your doctor, pharmacist, or health care provider.  2015, [employer]vier/Gold Standard. (2007-09-13 11:38:22)

## 2014-08-09 NOTE — Progress Notes (Signed)
Subjective:   Patient ID: [name] female   DOB: 10/11/1965 49 y.o.   MRN: [id number]  HPI: [name] is a 49 y.o. female with PMH as listed below presenting to opc today for routine follow up visit.   Recurrent sinusitis--feels like she has another sinus infection x2-3 weeks. Lots of pain in face around eyes, forehead, and nose. She has tried all otc medications including mucinex with no relief. She normally sees ENT but has not been able to go there. She does report success with augmentin for the infections in the past despite having a PCN allergy listed in the chart. She says that is ONLY to PCN but that she definitely tolerates Augmentin without complaints and has taken it several time. No sinus congestion at this time but actually feels very dry in her nose.   She is following up with sports medicine for her chronic pain and arthralgias.   She is started going to the gym to try to work on weight loss.   Pain medication refills requested  She has been having her migraine's again and did not like the beta blocker. She has been discussing this with her specialist and they may change therapy. Sinus pressure may be causing more of the headache.   Past Medical History  Diagnosis Date  . Anemia 10/2009    macrocytic anemia with baseline MCV 104-106  . PUD (peptic ulcer disease) 07/1997    per EGD report 07/1997 with history of esophagitis  . Migraines     on topamax and triptans, frequent (almost daily) attacks   . Hyperlipidemia   . HIV infection     CD4 = 570 (05/2010), VL undetectation  . Anxiety   . Spondylolisthesis of lumbar region     L5-S1  . Gastroparesis   . Shingles     in lumbar dermatome  . Hypertension     no meds  . Non Hodgkin's lymphoma     stage II, s/p resection, chemotherapy, radiotherapy, Dr. Beryle Beams is her oncologist.   . Asthma   . DVT (deep venous thrombosis)     history clot lt groin when she had lymphoma-in remision now.  . Traumatic  tear of lateral meniscus of right knee 04/20/2012  . 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 Hiatal hernia 01/01/2002  . Acute gastritis without mention of hemorrhage 01/01/2002  . Duodenitis without mention of hemorrhage 01/01/2002  . GERD (gastroesophageal reflux disease)   . IBS (irritable bowel syndrome)    Current Outpatient Prescriptions  Medication Sig Dispense Refill  . albuterol (PROAIR HFA) 108 (90 BASE) MCG/ACT inhaler use up to 2 puffs  every 4 hours as need for short of breath/ wheezing 8.5 g 0  . amoxicillin-clavulanate (AUGMENTIN) 875-125 MG per tablet Take 1 tablet by mouth 2 (two) times daily. 14 tablet 0  . botulinum toxin Type A (BOTOX) 100 UNITS SOLR injection INJECT 200 UNITS IM INTO  HEAD NECK EVERY 3 MONTHS    . Calcium Carbonate-Vitamin D (CALCIUM-VITAMIN D) 500-200 MG-UNIT per tablet Take 2 tablets by mouth daily.     . cetirizine (ZYRTEC) 10 MG tablet Take 1 tablet (10 mg total) by mouth daily. 30 tablet 11  . Darunavir-Cobicistat (PREZCOBIX) 800-150 MG TABS Take 1 tablet by mouth daily. 30 tablet 11  . diclofenac sodium (VOLTAREN) 1 % GEL Apply 2 g topically 2 (two) times daily. APPLY TO AFFECTED AREA TWICE A DAY FOR PAIN 2 Tube 11  . emtricitabine-tenofovir (TRUVADA)  200-300 MG per tablet TAKE 1 TABLET BY MOUTH EVERY DAY    . esomeprazole (NEXIUM) 40 MG capsule Take 1 capsule (40 mg total) by mouth 2 (two) times daily before a meal. 60 capsule 11  . fluconazole (DIFLUCAN) 100 MG tablet TAKE 1 TABLET BY MOUTH EVERY DAY OR AS DIRECTED (Patient not taking: Reported on 06/12/2014) 10 tablet 0  . FLUoxetine (PROZAC) 20 MG capsule Take 2 capsules (40 mg total) by mouth daily. 60 capsule 3  . LORazepam (ATIVAN) 0.5 MG tablet Take 1 tablet (0.5 mg total) by mouth daily as needed for anxiety. 30 tablet 3  . mometasone-formoterol (DULERA) 100-5 MCG/ACT AERO Take 2 puffs first thing in am and then another 2 puffs about 12 hours later. 1 Inhaler 11  . norethindrone  (MICRONOR,CAMILA,ERRIN) 0.35 MG tablet Take 1 tablet (0.35 mg total) by mouth daily. 1 Package 11  . [START ON 10/23/2014] oxyCODONE-acetaminophen (PERCOCET/ROXICET) 5-325 MG per tablet Take 1 tablet by mouth every 6 (six) hours as needed for severe pain. 120 tablet 0  . predniSONE (DELTASONE) 20 MG tablet Take 1 with breakfast and 1 with lunch for 4 days.    . promethazine (PHENERGAN) 50 MG tablet TAKE 1/2 TO 1 TABLET BY MOUTH EVERY 6 HOURS AS NEEDED FOR NAUSEA UP TO 4 TIMESDAILY 90 tablet 2  . propranolol (INDERAL) 40 MG tablet Take 1 tablet (40 mg total) by mouth 3 (three) times daily. (Patient taking differently: Take 40 mg by mouth 3 (three) times daily. ) 60 tablet 11  . scopolamine (TRANSDERM-SCOP) 1 MG/3DAYS Place 1 patch (1.5 mg total) onto the skin every other day as needed. 15 patch 6  . Tamsulosin HCl (FLOMAX) 0.4 MG CAPS Take 0.4 mg by mouth daily. 1-2 daily    . terconazole (TERAZOL 3) 0.8 % vaginal cream Place 1 applicator vaginally at bedtime. (Patient not taking: Reported on 06/12/2014) 20 g 0  . TRUVADA 200-300 MG per tablet TAKE 1 TABLET BY MOUTH EVERY DAY 30 tablet 11  . VOLTAREN 1 % GEL APPLY TO AFFECTED AREA TWICE A DAY FOR PAIN 100 g 1  . zolmitriptan (ZOMIG) 5 MG tablet Take 1 tablet (5 mg total) by mouth as needed for migraine. 15 tablet 11   No current facility-administered medications for this visit.   Family History  Problem Relation Age of Onset  . Hypertension Mother   . Stroke Mother   . Breast cancer Paternal Grandmother   . Kidney cancer Maternal Uncle   . Diabetes Maternal Uncle   . Diabetes Maternal Grandmother   . Emphysema Paternal Uncle     never smoker  . Emphysema Paternal Grandfather     never smoker  . Colon cancer Paternal Aunt     with met to lung   . Non-Hodgkin's lymphoma Maternal Uncle    History   Social History  . Marital Status: Divorced    Spouse Name: N/A    Number of Children: 0  . Years of Education: 12   Occupational History    . disabled    Social History Main Topics  . Smoking status: Never Smoker   . Smokeless tobacco: Never Used  . Alcohol Use: No  . Drug Use: No  . Sexual Activity: None   Other Topics Concern  . None   Social History Narrative      Patient is disabled.   Right handed.   Caffeine - Diet soda., tea   Education - high school   Review  of Systems:  Constitutional:  Denies fever, chills  HEENT:  Sinus pressure  Respiratory:  Denies SOB  Cardiovascular:  Denies chest pain  Gastrointestinal:  Denies nausea, vomiting, abdominal pain  Genitourinary:  Denies dysuria  Musculoskeletal:  Chronic diffuse body pain  Neurological:  Chronic intermittent migraines   Objective:  Physical Exam: Filed Vitals:   08/08/14 1105  BP: 132/74  Pulse: 85  Temp: 98.1 F (36.7 C)  TempSrc: Oral  Weight: 265 lb 6.4 oz (120.385 kg)  SpO2: 98%   Vitals reviewed. General: sitting in chair, NAD HEENT: EOMI, no nasal congestion, tenderness to light palpation of frontal, maxillary, and ethmoid sinuses Cardiac: RRR Pulm: clear to auscultation bilaterally, no wheezes, rales, or rhonchi Abd: soft, obese, BS present Ext: moving all extremities, wearing wrist brace Neuro: alert and oriented X3  Assessment & Plan:  Discussed with Dr. Daryll Drown

## 2014-08-09 NOTE — Assessment & Plan Note (Signed)
Refilled ativan until next visit. Doing well on low dose. Continues on prozac.

## 2014-08-09 NOTE — Assessment & Plan Note (Addendum)
Refilled percocet for February, march, and April Following with sports medicine Will plan on random tox screen next visit

## 2014-08-09 NOTE — Assessment & Plan Note (Addendum)
Recurrent, normally follows with ENT but unable to make appointment at this time. Tenderness and discomfort with increased sinus pressure on physical exam. Reports taking augmentin in the past that usually helps. PCN allergy listed in chart and discussed with patient, but she insists that augmentin does not cause any issues and tolerates well and prefers it to other antibiotics. She has failed over the counter medications.   -will give 7 day course of augmentin--cautioned on possibility of allergic reaction including but not limited to rash, sob, difficulty breathing, swelling to stop the medication immediately and notify us and go directly to the emergency room. She voices understanding and says she tolerates it fine without any issues.  -follow up with ENT as recommended--Dr. Erik Obey -try vaporizer at home

## 2014-08-12 ENCOUNTER — Other Ambulatory Visit: Payer: Self-pay | Admitting: Infectious Disease

## 2014-08-12 DIAGNOSIS — B20 Human immunodeficiency virus [HIV] disease: Secondary | ICD-10-CM

## 2014-08-12 NOTE — Progress Notes (Signed)
Internal Medicine Clinic Attending  Case discussed with Dr. Qureshi soon after the resident saw the patient.  We reviewed the resident's history and exam and pertinent patient test results.  I agree with the assessment, diagnosis, and plan of care documented in the resident's note. 

## 2014-08-13 ENCOUNTER — Ambulatory Visit (INDEPENDENT_AMBULATORY_CARE_PROVIDER_SITE_OTHER): Payer: Medicaid Other | Admitting: Neurology

## 2014-08-13 ENCOUNTER — Encounter: Payer: Self-pay | Admitting: *Deleted

## 2014-08-13 ENCOUNTER — Encounter: Payer: Self-pay | Admitting: Neurology

## 2014-08-13 VITALS — BP 126/74 | HR 83 | Ht 66.0 in | Wt 265.0 lb

## 2014-08-13 DIAGNOSIS — G43719 Chronic migraine without aura, intractable, without status migrainosus: Secondary | ICD-10-CM

## 2014-08-13 MED ORDER — ONABOTULINUMTOXINA 100 UNITS IJ SOLR
200.0000 [IU] | Freq: Once | INTRAMUSCULAR | Status: AC
  Administered 2014-08-13: 200 [IU] via INTRAMUSCULAR

## 2014-08-13 MED ORDER — VERAPAMIL HCL ER 120 MG PO CP24: 120.0000 mg | ORAL_CAPSULE | Freq: Every day | ORAL | Status: DC

## 2014-08-13 NOTE — Progress Notes (Signed)
HPI:   [name] is a 49 year old Caucasian female with past medical history of HIV, high cholesterol, migraines, depression, and non-Hodgkin lymphoma in 2005 with chemotherapy and radiation.   She has history of migraines since 19. In the last 10 years, she reports having a migraines daily. She is currently taking Topamax and Zomig with relief. She takes approximately 20-25 tablets of her Zomig per month.    She has aura's that look like floating dirt particles, flashing lights, and she sees Christmas tree lights when they are severe, she has nausea, vomiting and sometimes diarrhea when they're severe. She also complains of diplopia, blurry vision, and  photophobia. She also has had a lightning bolt sensation throughout her body.  She describes them as pulsating and throbbing  on a scale of 5-6/10 which she considers mild, when they are severe they can go as high as 10/10. They usually start in the right temporal area, but will also start on the left temporal and are worse.   Any type of smoke or MSG can precipitate a migraine. They generally last 2-9 days. She has been to the emergency department twice in the last year for migraines. She states "I just can't do this anymore I need to do something about her migraines"  She has tried in the past Maxalt, Imitrex, atenolol, verapamil, nortriptyline, Inderal, Depakote without benefit. Atenolol and verapamil caused hypotension. Depakote caused severe vomiting. Topamax causing her worsening headaches, I also put her on Zonegran, she denies significant improvement.  I started to give her BOTOX injection since May 2013, she responded very well,   Previous MRI brain was normal in 2013.  UPDATE Feb 10th 2016: Last Botox injection was in November 2015, overall, she responded very well, no significant side effect, she did have increased headaches over the past couple weeks because of her sinus infection, she is taking Zomig as needed, which has been  helpful,  She has tried Inderal as preventive medication, complains of fatigue, at the same time difficulty falling to sleep, verapamil was helpful to her migraine the past,  Physical Exam  General: Well groomed and obese female Neck: supple no carotid bruits Respiratory: clear to auscultation bilaterally Cardiovascular: regular rate rhythm She wear right knee brace  Neurologic Exam  Mental Status: obese, pleasant, awake, alert, cooperative to history, talking, and casual conversation. Cranial Nerves: CN II-XII pupils were equal round reactive to light. Extraocular movements were full.  Visual fields were full on confrontational test.  Facial sensation and strength were normal.   Uvula tongue were midline.  Head turning and shoulder shrugging were normal and symmetric.  Tongue protrusion into the cheeks strength were normal.  Motor: Normal tone, bulk, and strength. Sensory: Normal to light touch, pinprick, proprioception except decreased vibratory sensation to her right lower extremity she relates to peripheral neuropathy.  Decreased vibratory sensation to left upper extremity.   Coordination: There was no dysmetria noticed. Gait and Station: Narrow based and steady,  Reflexes: Deep tendon reflexes: present and symmetric.  Plantar responses are flexor.   Assessment and plan: 49 year-old Caucasian female with a history of HIV, hypercholesterolemia, Non hodgkin' lymphoma with chemotherapy and radiation and migraines since age 4.   She has tried and failed multiple preventive medications including atenolol, verapamil Maxalt, Imitrex, nortriptyline, and Depakote,Topamax,   She has migraines daily for more than  10 years. She is currently taking  zonogram  and Zomig with some relief.   Normal MRI brain, and neurological exam.  BOTOX injection  was performed according to protocol by [employer]. 100 units of BOTOX was dissolved into 2 cc NS.   Total of 200 units  Corrugator 2 sites, 10  units Procerus 1 site, 5 unit Frontalis 4 sites,  20 units, Temporalis 8 sites,  40 units  Occipitalis 6 sites, 30 units Cervical Paraspinal, 4 sites, 20 units Trapezius, 6 sites, 30 units  Extra 45 units to cervical paraspinals  Patient tolerate the injection well. Will return for repeat injection in 3 months.  She continue have frequent headaches, despite repeat Botox injection, could not tolerate Inderal due to side effect of fatigue, difficulty sleeping, previously responded to verapamil, I have add on verapamil ER 120 mg every day, Continue Zomig as needed

## 2014-09-01 ENCOUNTER — Encounter: Payer: Self-pay | Admitting: Infectious Disease

## 2014-09-01 ENCOUNTER — Ambulatory Visit (INDEPENDENT_AMBULATORY_CARE_PROVIDER_SITE_OTHER): Payer: Medicaid Other | Admitting: Infectious Disease

## 2014-09-01 VITALS — BP 117/79 | HR 92 | Temp 98.3°F | Wt 269.0 lb

## 2014-09-01 DIAGNOSIS — B20 Human immunodeficiency virus [HIV] disease: Secondary | ICD-10-CM

## 2014-09-01 DIAGNOSIS — J302 Other seasonal allergic rhinitis: Secondary | ICD-10-CM

## 2014-09-01 DIAGNOSIS — F331 Major depressive disorder, recurrent, moderate: Secondary | ICD-10-CM | POA: Insufficient documentation

## 2014-09-01 DIAGNOSIS — J3489 Other specified disorders of nose and nasal sinuses: Secondary | ICD-10-CM

## 2014-09-01 LAB — CBC WITH DIFFERENTIAL/PLATELET
Basophils Absolute: 0 10*3/uL (ref 0.0–0.1)
Basophils Relative: 0 % (ref 0–1)
EOS ABS: 0.1 10*3/uL (ref 0.0–0.7)
EOS PCT: 2 % (ref 0–5)
HEMATOCRIT: 39.4 % (ref 36.0–46.0)
HEMOGLOBIN: 13 g/dL (ref 12.0–15.0)
LYMPHS ABS: 2.1 10*3/uL (ref 0.7–4.0)
Lymphocytes Relative: 41 % (ref 12–46)
MCH: 27.2 pg (ref 26.0–34.0)
MCHC: 33 g/dL (ref 30.0–36.0)
MCV: 82.4 fL (ref 78.0–100.0)
MONOS PCT: 5 % (ref 3–12)
MPV: 9.2 fL (ref 8.6–12.4)
Monocytes Absolute: 0.3 10*3/uL (ref 0.1–1.0)
Neutro Abs: 2.7 10*3/uL (ref 1.7–7.7)
Neutrophils Relative %: 52 % (ref 43–77)
Platelets: 177 10*3/uL (ref 150–400)
RBC: 4.78 MIL/uL (ref 3.87–5.11)
RDW: 14.5 % (ref 11.5–15.5)
WBC: 5.2 10*3/uL (ref 4.0–10.5)

## 2014-09-01 MED ORDER — BECLOMETHASONE DIPROPIONATE 80 MCG/ACT NA AERS: 2.0000 | INHALATION_SPRAY | Freq: Every day | NASAL | Status: DC

## 2014-09-01 MED ORDER — FLUCONAZOLE 100 MG PO TABS: 100.0000 mg | ORAL_TABLET | Freq: Every day | ORAL | Status: DC

## 2014-09-01 NOTE — Progress Notes (Signed)
Subjective:    Patient ID: [name], female    DOB: Feb 25, 1966, 49 y.o.   MRN: [id number]  HPI   LORRINE KILLILEA is a 49 y.o. female who is doing superbly well on her  antiviral regimen, Prezcobix and Truvada with undetectable viral load and health cd4 count.  Lab Results  Component Value Date   HIV1RNAQUANT <20 04/23/2014   Lab Results  Component Value Date   CD4TABS 810 04/23/2014   CD4TABS 820 09/23/2013   CD4TABS 700 07/16/2013    She has been suffering from what sounds like seasonal allergies with +/- viral, +/- bacterial sinus infections having recently completed augmentin and now with "thrush because of my antivirals."  She wanted multiple labs checked including her a1c, magnesium.     Review of Systems  Constitutional: Negative for fever, chills, diaphoresis, activity change, appetite change, fatigue and unexpected weight change.  HENT: Positive for postnasal drip, sinus pressure and sore throat. Negative for congestion, facial swelling, rhinorrhea, sneezing and trouble swallowing.   Eyes: Negative for photophobia and visual disturbance.  Respiratory: Positive for apnea and cough. Negative for chest tightness, shortness of breath, wheezing and stridor.   Cardiovascular: Positive for palpitations. Negative for chest pain and leg swelling.  Gastrointestinal: Negative for nausea, vomiting, abdominal pain, diarrhea, constipation, blood in stool, abdominal distention and anal bleeding.  Genitourinary: Negative for dysuria, hematuria, flank pain and difficulty urinating.  Musculoskeletal: Positive for myalgias and back pain. Negative for joint swelling, arthralgias and gait problem.  Skin: Negative for color change, pallor, rash and wound.  Neurological: Negative for dizziness, tremors, weakness and light-headedness.  Hematological: Negative for adenopathy. Does not bruise/bleed easily.  Psychiatric/Behavioral: Positive for dysphoric mood and decreased concentration. Negative  for suicidal ideas, hallucinations, behavioral problems, confusion, sleep disturbance, self-injury and agitation. The patient is nervous/anxious. The patient is not hyperactive.        Objective:   Physical Exam  Constitutional: She is oriented to person, place, and time. She appears well-developed and well-nourished. No distress.  HENT:  Head: Normocephalic and atraumatic.  Mouth/Throat: Oropharynx is clear and moist. No oropharyngeal exudate or posterior oropharyngeal erythema.    Eyes: Conjunctivae and EOM are normal. Pupils are equal, round, and reactive to light. No scleral icterus.  Neck: Normal range of motion. Neck supple. No JVD present.  Cardiovascular: Normal rate, regular rhythm and normal heart sounds.  Exam reveals no gallop and no friction rub.   No murmur heard. Pulmonary/Chest: Effort normal and breath sounds normal. No respiratory distress. She has no wheezes. She has no rales. She exhibits no tenderness.  Abdominal: She exhibits no distension and no mass. There is no tenderness. There is no rebound and no guarding.  Musculoskeletal: She exhibits no edema or tenderness.  Lymphadenopathy:    She has no cervical adenopathy.  Neurological: She is alert and oriented to person, place, and time. She has normal reflexes. She exhibits normal muscle tone. Coordination normal.  Skin: Skin is warm and dry. She is not diaphoretic. No erythema. No pallor.  Psychiatric: Her speech is normal. Judgment and thought content normal. Her mood appears anxious. She is withdrawn.  Nursing note and vitals reviewed.         Assessment & Plan:  HIV Prezcobix and truvada to be continued, and will check labs today and in 6 months. Will consider changing her to T[place]ay based regimen without a booster to avoid all of the drug drug interactions she has due to her COBI.  Seasonal allergies: I think a bulk of her sinus ssx are due to seasonal allergies and she may benefit from INH  corticosteroid whose metablism is not as dramatically INH by COBI (for ex FLUTICASONE contraindicated due to risk for AI from inhibition of metabolism of systemically absorbed flonase. Instead will try qnasal. I spent greater than 25 minutes with the patient including greater than 50% of time in face to face counsel of the patient and in coordination of their care.   Anxiety and depression : still a prominent issue will defer to primary  Obese: she asked that I check a1c which I will do--it has been normal in the past  Thrush: not overwhelmed by her exam and I think her anxiety is playing a role. Perhaps antibiotics could have caused a bit of thrush but it is very unimpressive.

## 2014-09-02 LAB — COMPLETE METABOLIC PANEL WITH GFR
ALT: 14 U/L (ref 0–35)
AST: 18 U/L (ref 0–37)
Albumin: 4.1 g/dL (ref 3.5–5.2)
Alkaline Phosphatase: 67 U/L (ref 39–117)
BUN: 16 mg/dL (ref 6–23)
CALCIUM: 9.3 mg/dL (ref 8.4–10.5)
CO2: 29 mEq/L (ref 19–32)
CREATININE: 0.87 mg/dL (ref 0.50–1.10)
Chloride: 103 mEq/L (ref 96–112)
GFR, Est African American: >89 mL/min
GFR, Est Non African American: 80 mL/min
GLUCOSE: 108 mg/dL — AB (ref 70–99)
Potassium: 4.7 mEq/L (ref 3.5–5.3)
Sodium: 141 mEq/L (ref 135–145)
Total Bilirubin: 0.2 mg/dL (ref 0.2–1.2)
Total Protein: 6.8 g/dL (ref 6.0–8.3)

## 2014-09-02 LAB — LIPID PANEL
CHOLESTEROL: 250 mg/dL — AB (ref 0–200)
HDL: 36 mg/dL — AB (ref >=46)
Total CHOL/HDL Ratio: 6.9 Ratio
Triglycerides: 434 mg/dL — ABNORMAL HIGH (ref <150)

## 2014-09-02 LAB — HEMOGLOBIN A1C
Hgb A1c MFr Bld: 5.8 % — ABNORMAL HIGH (ref <5.7)
Mean Plasma Glucose: 120 mg/dL — ABNORMAL HIGH (ref <117)

## 2014-09-02 LAB — RPR

## 2014-09-03 LAB — HIV-1 RNA QUANT-NO REFLEX-BLD
HIV 1 RNA Quant: <20 copies/mL (ref <20)
HIV-1 RNA Quant, Log: <1.3 {Log} (ref <1.30)

## 2014-09-03 LAB — T-HELPER CELL (CD4) - (RCID CLINIC ONLY)
CD4 % Helper T Cell: 33 % (ref 33–55)
CD4 T Cell Abs: 680 /uL (ref 400–2700)

## 2014-09-04 ENCOUNTER — Other Ambulatory Visit: Payer: Self-pay | Admitting: *Deleted

## 2014-09-04 ENCOUNTER — Telehealth: Payer: Self-pay | Admitting: *Deleted

## 2014-09-04 ENCOUNTER — Other Ambulatory Visit: Payer: Self-pay | Admitting: Infectious Disease

## 2014-09-04 DIAGNOSIS — R110 Nausea: Secondary | ICD-10-CM

## 2014-09-04 DIAGNOSIS — F329 Major depressive disorder, single episode, unspecified: Secondary | ICD-10-CM

## 2014-09-04 DIAGNOSIS — F32A Depression, unspecified: Secondary | ICD-10-CM

## 2014-09-04 NOTE — Telephone Encounter (Signed)
Pt called and stated that her prozac was supposed to change to 60mg  daily, states she really needs them, depression is not any better

## 2014-09-04 NOTE — Telephone Encounter (Signed)
Patient calling, asked [place] to contact her pharmacy re: issue with a nasal spray. RN spoke with pharmacist, they need a prior authorization.  Pharmacy had incorrect fax number for our office.  RN corrected Psychologist, counselling, pharmacy will fax the PA request. Landis Gandy, RN

## 2014-09-05 ENCOUNTER — Telehealth: Payer: Self-pay | Admitting: *Deleted

## 2014-09-05 MED ORDER — FLUOXETINE HCL 60 MG PO TABS
60.0000 mg | ORAL_TABLET | Freq: Every day | ORAL | Status: DC
Start: 2014-09-05 — End: 2014-11-14

## 2014-09-05 NOTE — Telephone Encounter (Signed)
PA for Qnasl approved through 08/31/15, [place] # [id number]. Patient and pharmacy notified. [name]

## 2014-09-05 NOTE — Telephone Encounter (Signed)
I received a message from Triage that [name] wishes to start increase dose of prozac. She is currently taking 40mg  but wishes to increase to 60mg . She denies suicidal or homicidal ideation but does report decreased mood lately. She feels like the prozac is helping but a little less lately. She has also got back in the gym but sometimes doesn't feel like going and is frustrated with her weight gain after her accident. I encouraged her to continue her work out routine and think it is reasonable to titrate up the dose at this time to 60mg  daily. She is advised to monitor for side effects including but not limited to possible worsening of depression or any suicidal or homicidal thoughts and to notify me if it is working or not then we can stop and adjust as needed right away. She is happy with increasing her dose and taking just 1 tablet daily now. She will return to see me either in May or June.   She also asked about the side effects of nexium which were read to her over the phone using up to date.  She specifically asked about cardiac side effects which do not seem to be mentioned in the adverse effects but does include chest pain which I mentioned to her in the <1% group.   She thanked me for the call.

## 2014-09-23 ENCOUNTER — Encounter: Payer: Self-pay | Admitting: Infectious Disease

## 2014-09-23 ENCOUNTER — Telehealth: Payer: Self-pay | Admitting: *Deleted

## 2014-09-23 ENCOUNTER — Ambulatory Visit (INDEPENDENT_AMBULATORY_CARE_PROVIDER_SITE_OTHER): Payer: Medicaid Other | Admitting: Infectious Disease

## 2014-09-23 VITALS — BP 126/81 | HR 76 | Temp 98.6°F | Ht 66.0 in

## 2014-09-23 DIAGNOSIS — B20 Human immunodeficiency virus [HIV] disease: Secondary | ICD-10-CM

## 2014-09-23 DIAGNOSIS — J302 Other seasonal allergic rhinitis: Secondary | ICD-10-CM

## 2014-09-23 DIAGNOSIS — E781 Pure hyperglyceridemia: Secondary | ICD-10-CM

## 2014-09-23 DIAGNOSIS — B3781 Candidal esophagitis: Secondary | ICD-10-CM | POA: Diagnosis not present

## 2014-09-23 DIAGNOSIS — E881 Lipodystrophy, not elsewhere classified: Secondary | ICD-10-CM

## 2014-09-23 DIAGNOSIS — E785 Hyperlipidemia, unspecified: Secondary | ICD-10-CM

## 2014-09-23 DIAGNOSIS — D539 Nutritional anemia, unspecified: Secondary | ICD-10-CM

## 2014-09-23 MED ORDER — ROSUVASTATIN CALCIUM 10 MG PO TABS: 10.0000 mg | ORAL_TABLET | Freq: Every day | ORAL | Status: DC

## 2014-09-23 NOTE — Progress Notes (Signed)
Subjective:    Patient ID: [name], female    DOB: Aug 03, 1965, 49 y.o.   MRN: [id number]  HPI   [name] is a 49 y.o. female who is doing superbly well on her  antiviral regimen, Prezcobix and Truvada with undetectable viral load and health cd4 count.  Lab Results  Component Value Date   HIV1RNAQUANT <20 09/01/2014   Lab Results  Component Value Date   CD4TABS 680 09/01/2014   CD4TABS 810 04/23/2014   CD4TABS 820 09/23/2013    She had  been suffering from what sounds like seasonal allergies with +/- viral, +/- bacterial sinus infections having recently completed augmentin and now with "thrush because of my antivirals."  She wanted multiple labs checked including her a1c, magnesium at last visit and these were ALL reviewed with her including her TGlycerides, her A1C, her MCV.      Review of Systems  Constitutional: Negative for fever, chills, diaphoresis, activity change, appetite change, fatigue and unexpected weight change.  HENT: Negative for congestion, facial swelling, postnasal drip, rhinorrhea, sinus pressure, sneezing, sore throat and trouble swallowing.   Eyes: Negative for photophobia and visual disturbance.  Respiratory: Negative for apnea, cough, chest tightness, shortness of breath, wheezing and stridor.   Cardiovascular: Positive for palpitations. Negative for chest pain and leg swelling.  Gastrointestinal: Negative for nausea, vomiting, abdominal pain, diarrhea, constipation, blood in stool, abdominal distention and anal bleeding.  Genitourinary: Negative for dysuria, hematuria, flank pain and difficulty urinating.  Musculoskeletal: Positive for myalgias and back pain. Negative for joint swelling, arthralgias and gait problem.  Skin: Negative for color change, pallor, rash and wound.  Neurological: Negative for dizziness, tremors, weakness and light-headedness.  Hematological: Negative for adenopathy. Does not bruise/bleed easily.  Psychiatric/Behavioral:  Positive for dysphoric mood and decreased concentration. Negative for suicidal ideas, hallucinations, behavioral problems, confusion, sleep disturbance, self-injury and agitation. The patient is nervous/anxious. The patient is not hyperactive.        Objective:   Physical Exam  Constitutional: She is oriented to person, place, and time. She appears well-developed and well-nourished. No distress.  HENT:  Head: Normocephalic and atraumatic.  Mouth/Throat: Oropharynx is clear and moist. No oropharyngeal exudate, posterior oropharyngeal edema, posterior oropharyngeal erythema or tonsillar abscesses.  Eyes: Conjunctivae and EOM are normal. Pupils are equal, round, and reactive to light. No scleral icterus.  Neck: Normal range of motion. Neck supple. No JVD present.  Cardiovascular: Normal rate, regular rhythm and normal heart sounds.  Exam reveals no gallop and no friction rub.   No murmur heard. Pulmonary/Chest: Effort normal and breath sounds normal. No respiratory distress. She has no wheezes. She has no rales. She exhibits no tenderness.  Abdominal: She exhibits no distension and no mass. There is no tenderness. There is no rebound and no guarding.  Musculoskeletal: She exhibits no edema or tenderness.  Lymphadenopathy:    She has no cervical adenopathy.  Neurological: She is alert and oriented to person, place, and time. She has normal reflexes. She exhibits normal muscle tone. Coordination normal.  Skin: Skin is warm and dry. She is not diaphoretic. No erythema. No pallor.  Psychiatric: Her speech is normal. Judgment and thought content normal. Her mood appears anxious. She is withdrawn.  Nursing note and vitals reviewed.         Assessment & Plan:  HIV Prezcobix and truvada to be continued, and will check labs today and in 6 months. Will consider changing her to [place] based regimen without a  booster to avoid all of the drug drug interactions she has due to her COBI in future RTC in  6 months I spent greater than 25 minutes with the patient including greater than 50% of time in face to face counsel of the patient and in coordination of their care. We discussed nature of her current ARV as well as her blood sugars, her risk for DM, her obesity, her ? b12 deficiency hx.  Hypertriglyceridemia: will start low dose crestor per Pharmacy recs   Seasonal allergies: continue qnasal.  Anxiety and depression : still a prominent issue will defer to primary  Obese: while she has several relative with DM she is not succumbed to it. Still very good idea for her to lose weight  Thrush: asking for this med "in case" I think this is a psychosomatic issue rather than genuine thrush that we are dealing with.

## 2014-09-23 NOTE — Progress Notes (Signed)
HPI: [name] is a 49 y.o. female who presents to the [facility] today for follow-up of her HIV infection.  Allergies: Allergies  Allergen Reactions  . Diazepam     Triggers migraines  . Divalproex Sodium   . Gabapentin Nausea And Vomiting  . Ondansetron Hcl     Triggers migraines, "makes me vomit"  . Penicillins     REACTION: lips swollen to point of bleeding, sever vaginal irritation  Reports tolerating augmentin well without any complaints or reaction  . Propoxyphene N-Acetaminophen Nausea And Vomiting  . Simvastatin     PATIENT CANNOT BE PRESCRIBED THIS STATIN WHILE RECEIVING NORVIR  . Tramadol Hcl     REACTION: migraines and itching--hx of migraines even without medication   . Zocor [Simvastatin - High Dose]     Vitals: Temp: 98.6 F (37 C) (03/22 1055) Temp Source: Oral (03/22 1055) BP: 126/81 mmHg (03/22 1055) Pulse Rate: 76 (03/22 1055)  Past Medical History: Past Medical History  Diagnosis Date  . Anemia 10/2009    macrocytic anemia with baseline MCV 104-106  . PUD (peptic ulcer disease) 07/1997    per EGD report 07/1997 with history of esophagitis  . Migraines     on topamax and triptans, frequent (almost daily) attacks   . Hyperlipidemia   . HIV infection     CD4 = 570 (05/2010), VL undetectation  . Anxiety   . Spondylolisthesis of lumbar region     L5-S1  . Gastroparesis   . Shingles     in lumbar dermatome  . Hypertension     no meds  . Non Hodgkin's lymphoma     stage II, s/p resection, chemotherapy, radiotherapy, Dr. Beryle Beams is her oncologist.   . Asthma   . DVT (deep venous thrombosis)     history clot lt groin when she had lymphoma-in remision now.  . Traumatic tear of lateral meniscus of right knee 04/20/2012  . 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 Hiatal hernia 01/01/2002  . Acute gastritis without mention of hemorrhage 01/01/2002  . Duodenitis without mention of hemorrhage 01/01/2002  . GERD (gastroesophageal reflux disease)   . IBS  (irritable bowel syndrome)     Social History: History   Social History  . Marital Status: Divorced    Spouse Name: N/A  . Number of Children: 0  . Years of Education: 12   Occupational History  . disabled    Social History Main Topics  . Smoking status: Never Smoker   . Smokeless tobacco: Never Used  . Alcohol Use: No  . Drug Use: No  . Sexual Activity: Not on file   Other Topics Concern  . None   Social History Narrative      Patient is disabled.   Right handed.   Caffeine - Diet soda., tea   Education - high school   Current Regimen: Prezcobix + Truvada  Labs: HIV 1 RNA QUANT (copies/mL)  Date Value  09/01/2014 <20  04/23/2014 <20  09/23/2013 <20   CD4 T CELL ABS (/uL)  Date Value  09/01/2014 680  04/23/2014 810  09/23/2013 820   HEP B S AB (no units)  Date Value  10/22/2009 NEG   HEPATITIS B SURFACE AG (no units)  Date Value  10/22/2009 NEG   HCV AB (no units)  Date Value  09/23/2013 NEGATIVE    CrCl: CrCl cannot be calculated (Unknown ideal weight.).  Lipids:    Component Value Date/Time   CHOL 250* 09/01/2014 1702  TRIG 434* 09/01/2014 1702   HDL 36* 09/01/2014 1702   CHOLHDL 6.9 09/01/2014 1702   VLDL NOT CALC 09/01/2014 1702   LDLCALC NOT CALC 09/01/2014 1702    Assessment: 49 yo f who presents to the [facility] for HIV follow-up.  She is currently taking Prezcobix + Truvada.  Discussed the patient's triglyceride level with her.  The patient's triglycerides are 434.  Discussed current options with her and decided to restart the patient on Crestor 10 mg daily.  Recommendations: Continue Truvada + Prezcobix Start Crestor 10 mg PO daily  Shron Ozer L. Nicole Kindred, PharmD Clinical Infectious Disease [facility] 09/23/2014, 2:53 PM

## 2014-09-23 NOTE — Telephone Encounter (Signed)
Call from [facility] in [place] clinic - [phone]  Patient was seen in [place] today and [place] asked Dr. Tommy Medal to refer her to a Cardiologist.   Doctor is not sure why she needs to go to [facility] and feels PCP needs to do the referral. Pt  Last visit here was 08/08/14, no scheduled visit for future.  Please advise

## 2014-09-24 ENCOUNTER — Telehealth: Payer: Self-pay | Admitting: *Deleted

## 2014-09-24 NOTE — Telephone Encounter (Signed)
Thank you for letting me know. I called her today to discuss and she said she had mentioned and discussed with him about in the past, similar to when she saw a pulmonologist. She just wanted to get her heart checked out given that she has family history of heart disease. I informed her that we can certainly take a look in the IM clinic prior to any referral and then if need be refer if appropriate and she is in agreement. She denies any chest pain at this time but does say that she has some discomfort occasionally with exertion or lifting but also has anxiety and asthma that she thinks may be contributing. She also reports having chronic lower extremity swelling that is off and on.   I have recommended for her to be seen in [facility] when she is next available. She has requested for me to have the front desk call her to set up an appointment.

## 2014-09-24 NOTE — Telephone Encounter (Signed)
PA for Crestor completed, signed by Dr. Tommy Medal and faxed back to [facility].

## 2014-09-25 NOTE — Telephone Encounter (Signed)
Message to front desk to schedule appointment

## 2014-10-09 ENCOUNTER — Other Ambulatory Visit: Payer: Self-pay | Admitting: Infectious Disease

## 2014-10-09 DIAGNOSIS — B20 Human immunodeficiency virus [HIV] disease: Secondary | ICD-10-CM

## 2014-10-15 NOTE — Telephone Encounter (Signed)
PA approved.

## 2014-11-10 ENCOUNTER — Telehealth: Payer: Self-pay | Admitting: Internal Medicine

## 2014-11-10 NOTE — Telephone Encounter (Signed)
Call to patient to confirm appointment for 11/11/14 at 2:45 lmtcb

## 2014-11-11 ENCOUNTER — Encounter: Payer: Medicaid Other | Admitting: Internal Medicine

## 2014-11-12 ENCOUNTER — Telehealth: Payer: Self-pay | Admitting: *Deleted

## 2014-11-12 ENCOUNTER — Ambulatory Visit: Payer: Medicaid Other | Admitting: Neurology

## 2014-11-12 ENCOUNTER — Encounter: Payer: Self-pay | Admitting: *Deleted

## 2014-11-12 DIAGNOSIS — G43719 Chronic migraine without aura, intractable, without status migrainosus: Secondary | ICD-10-CM

## 2014-11-12 NOTE — Telephone Encounter (Signed)
Patient no showed her appt for Botox injections.

## 2014-11-13 ENCOUNTER — Other Ambulatory Visit: Payer: Self-pay | Admitting: Infectious Disease

## 2014-11-13 ENCOUNTER — Encounter: Payer: Self-pay | Admitting: Neurology

## 2014-11-13 ENCOUNTER — Encounter: Payer: Self-pay | Admitting: *Deleted

## 2014-11-14 ENCOUNTER — Ambulatory Visit (INDEPENDENT_AMBULATORY_CARE_PROVIDER_SITE_OTHER): Payer: Medicaid Other | Admitting: Internal Medicine

## 2014-11-14 ENCOUNTER — Encounter: Payer: Self-pay | Admitting: Internal Medicine

## 2014-11-14 ENCOUNTER — Ambulatory Visit (HOSPITAL_COMMUNITY)
Admission: RE | Admit: 2014-11-14 | Discharge: 2014-11-14 | Disposition: A | Discharge status: Home / Self Care | Payer: Medicaid Other | Source: Ambulatory Visit

## 2014-11-14 ENCOUNTER — Ambulatory Visit (HOSPITAL_COMMUNITY)
Admission: RE | Admit: 2014-11-14 | Discharge: 2014-11-14 | Disposition: A | Discharge status: Home / Self Care | Payer: Medicaid Other | Source: Ambulatory Visit | Attending: Internal Medicine | Admitting: Internal Medicine

## 2014-11-14 ENCOUNTER — Encounter (HOSPITAL_COMMUNITY): Payer: Medicaid Other

## 2014-11-14 ENCOUNTER — Other Ambulatory Visit: Payer: Self-pay | Admitting: *Deleted

## 2014-11-14 VITALS — BP 136/81 | HR 81 | Temp 97.4°F | Wt 268.2 lb

## 2014-11-14 DIAGNOSIS — F32A Depression, unspecified: Secondary | ICD-10-CM

## 2014-11-14 DIAGNOSIS — R0789 Other chest pain: Secondary | ICD-10-CM | POA: Diagnosis not present

## 2014-11-14 DIAGNOSIS — G894 Chronic pain syndrome: Secondary | ICD-10-CM | POA: Diagnosis present

## 2014-11-14 DIAGNOSIS — M79605 Pain in left leg: Secondary | ICD-10-CM

## 2014-11-14 DIAGNOSIS — M79604 Pain in right leg: Secondary | ICD-10-CM | POA: Diagnosis not present

## 2014-11-14 DIAGNOSIS — M79606 Pain in leg, unspecified: Secondary | ICD-10-CM | POA: Diagnosis not present

## 2014-11-14 DIAGNOSIS — F329 Major depressive disorder, single episode, unspecified: Secondary | ICD-10-CM | POA: Diagnosis not present

## 2014-11-14 DIAGNOSIS — Z6841 Body Mass Index (BMI) 40.0 and over, adult: Secondary | ICD-10-CM | POA: Diagnosis not present

## 2014-11-14 DIAGNOSIS — R296 Repeated falls: Secondary | ICD-10-CM

## 2014-11-14 DIAGNOSIS — F419 Anxiety disorder, unspecified: Secondary | ICD-10-CM

## 2014-11-14 MED ORDER — OXYCODONE-ACETAMINOPHEN 5-325 MG PO TABS: 1.0000 | ORAL_TABLET | Freq: Four times a day (QID) | ORAL | Status: DC | PRN

## 2014-11-14 MED ORDER — LORAZEPAM 0.5 MG PO TABS: 0.5000 mg | ORAL_TABLET | Freq: Every day | ORAL | Status: DC | PRN

## 2014-11-14 MED ORDER — FLUOXETINE HCL 60 MG PO TABS: 60.0000 mg | ORAL_TABLET | Freq: Every day | ORAL | Status: DC

## 2014-11-14 NOTE — Assessment & Plan Note (Signed)
Weight down 1 pound this visit and she has been walking more trying to get back onto exercise routine

## 2014-11-14 NOTE — Assessment & Plan Note (Addendum)
She is up to prozac 60mg  daily and satisfied with her dose currently--she was prescribed 1 tablet to ease the pill burden, however, her pharmacy has been still giving her 20mg  tablets and she will discuss with them if they can give her the 60mg  tablet instead.She feels more anxious lately due to parents being recently hospitalized.  Refilled ativan and prozac

## 2014-11-14 NOTE — Progress Notes (Signed)
Subjective:   Patient ID: [name] female   DOB: 10/03/1965 49 y.o.   MRN: [id number]  HPI: SherryAmeliah R May is a 49 y.o. female with PMH as listed below presenting to clinic today for routine follow up visit.   Requesting refills for her medications for pain and anxiety. She feels more anxious lately given recent hospitalizations of her parents.   Chest tightness/heaviness--asked ID doctor for cardiology referral and then discussed with me on the phone in March that she wanted to get her heart checked out and she was advised to make a clinic appointment, however that has not happened and she says the clinic never called her.  She does endorse occasional tightness in the substernal region up to neck, lasting a few minutes, associated mostly with exertion, resolves spontaneously, occasionally radiates down right arm, denies tenderness to palpation. Last time she noticed it was when climbing a lot of stairs a couple of weeks ago. Associates the sensation with her anxiety aswell and sensation of palpitations. No SOB at rest and endorses DOE occasionally. She feels like she can walk from here to the parking lot without getting short of breath.  No chest pain or pressure at this time.   Family history of heart disease: mom just got out of the hospital for a heart "blockage" per patient last month, she is in her 2s. She has PAD. Maternal grandmother also had heart attacks. Strokes also run in the family. Maternal grandfather and uncles on mom's side with heart disease.   B/l leg pain for a couple of months--notes throbbing sensation in right calf more than left. Pain with walking and at rest. Improving throbbing pain since walking to try to exercise more. Using ice and heat ot area to help.  Hx of left groin blood clot per patient and was on coumadin.   [place] twice this past week--trip and fall, hitting mostly right leg when she tripped. Denies hitting head or LOC.   Past Medical History  Diagnosis  Date  . Anemia 10/2009    macrocytic anemia with baseline MCV 104-106  . PUD (peptic ulcer disease) 07/1997    per EGD report 07/1997 with history of esophagitis  . Migraines     on topamax and triptans, frequent (almost daily) attacks   . Hyperlipidemia   . HIV infection     CD4 = 570 (05/2010), VL undetectation  . Anxiety   . Spondylolisthesis of lumbar region     L5-S1  . Gastroparesis   . Shingles     in lumbar dermatome  . Hypertension     no meds  . Non Hodgkin's lymphoma     stage II, s/p resection, chemotherapy, radiotherapy, Dr. Beryle Beams is her oncologist.   . Asthma   . DVT (deep venous thrombosis)     history clot lt groin when she had lymphoma-in remision now.  . Traumatic tear of lateral meniscus of right knee 04/20/2012  . 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 Hiatal hernia 01/01/2002  . Acute gastritis without mention of hemorrhage 01/01/2002  . Duodenitis without mention of hemorrhage 01/01/2002  . GERD (gastroesophageal reflux disease)   . IBS (irritable bowel syndrome)    Current Outpatient Prescriptions  Medication Sig Dispense Refill  . Beclomethasone Dipropionate 80 MCG/ACT AERS Place 2 Inhalers into the nose daily. 8.7 g 10  . botulinum toxin Type A (BOTOX) 100 UNITS SOLR injection INJECT 200 UNITS IM INTO  HEAD NECK EVERY 3 MONTHS    .  Calcium Carbonate-Vitamin D (CALCIUM-VITAMIN D) 500-200 MG-UNIT per tablet Take 2 tablets by mouth daily.     . cetirizine (ZYRTEC) 10 MG tablet Take 1 tablet (10 mg total) by mouth daily. 30 tablet 11  . emtricitabine-tenofovir (TRUVADA) 200-300 MG per tablet TAKE 1 TABLET BY MOUTH EVERY DAY    . esomeprazole (NEXIUM) 40 MG capsule Take 1 capsule (40 mg total) by mouth 2 (two) times daily before a meal. 60 capsule 11  . fluconazole (DIFLUCAN) 100 MG tablet Take 1 tablet (100 mg total) by mouth daily. 14 tablet 0  . FLUoxetine HCl 60 MG TABS Take 60 mg by mouth daily. 30 tablet 2  . LORazepam (ATIVAN) 0.5 MG tablet Take  1 tablet (0.5 mg total) by mouth daily as needed for anxiety. 30 tablet 3  . mometasone-formoterol (DULERA) 100-5 MCG/ACT AERO Take 2 puffs first thing in am and then another 2 puffs about 12 hours later. 1 Inhaler 11  . oxyCODONE-acetaminophen (PERCOCET/ROXICET) 5-325 MG per tablet Take 1 tablet by mouth every 6 (six) hours as needed for severe pain. 120 tablet 0  . PREZCOBIX 800-150 MG per tablet TAKE 1 TABLET BY MOUTH EVERY DAY 30 tablet 5  . PROAIR HFA 108 (90 BASE) MCG/ACT inhaler INHALE 1 TO 2 PUFFS EVERY 4 HOURS AS NEEDED FOR WHEEZING & SHORTNESS OF BREATH 8.5 g 2  . promethazine (PHENERGAN) 50 MG tablet TAKE 1/2 TO 1 TABLET BY MOUTH EVERY 6 HOURS AS NEEDED NAUSEA 90 tablet 3  . rosuvastatin (CRESTOR) 10 MG tablet Take 1 tablet (10 mg total) by mouth daily. 30 tablet 5  . scopolamine (TRANSDERM-SCOP) 1 MG/3DAYS Place 1 patch (1.5 mg total) onto the skin every other day as needed. 15 patch 6  . Tamsulosin HCl (FLOMAX) 0.4 MG CAPS Take 0.4 mg by mouth daily. 1-2 daily    . TRUVADA 200-300 MG per tablet TAKE 1 TABLET BY MOUTH EVERY DAY 30 tablet 11  . verapamil (VERELAN PM) 120 MG 24 hr capsule Take 1 capsule (120 mg total) by mouth at bedtime. 30 capsule 11  . VOLTAREN 1 % GEL APPLY TO AFFECTED AREA TWICE A DAY FOR PAIN 100 g 1  . zolmitriptan (ZOMIG) 5 MG tablet Take 1 tablet (5 mg total) by mouth as needed for migraine. 15 tablet 11   No current facility-administered medications for this visit.   Family History  Problem Relation Age of Onset  . Hypertension Mother   . Stroke Mother   . Breast cancer Paternal Grandmother   . Kidney cancer Maternal Uncle   . Diabetes Maternal Uncle   . Diabetes Maternal Grandmother   . Emphysema Paternal Uncle     never smoker  . Emphysema Paternal Grandfather     never smoker  . Colon cancer Paternal Aunt     with met to lung   . Non-Hodgkin's lymphoma Maternal Uncle    History   Social History  . Marital Status: Divorced    Spouse Name: N/A   . Number of Children: 0  . Years of Education: 12   Occupational History  . disabled    Social History Main Topics  . Smoking status: Never Smoker   . Smokeless tobacco: Never Used  . Alcohol Use: No  . Drug Use: No  . Sexual Activity: Not on file   Other Topics Concern  . Not on file   Social History Narrative      Patient is disabled.   Right handed.  Caffeine - Diet soda., tea   Education - high school   Review of Systems:  Constitutional:  Denies fever, chills.  HEENT:  Denies congestion  Respiratory:  Denies SOB at this time. Occasional DOE.   Cardiovascular:  Occasional chest tightness  Gastrointestinal:  Denies abdominal pain  Genitourinary:  Denies dysuria  Musculoskeletal:  B/l calf pain  Neurological:  Chronic migraines   Objective:  Physical Exam: Filed Vitals:   11/14/14 1540  BP: 136/81  Pulse: 81  Temp: 97.4 F (36.3 C)  TempSrc: Oral  Weight: 268 lb 3.2 oz (121.655 kg)   Vitals reviewed. General: sitting in chair, using some papers as a fan, feeling hot HEENT: EOMI Cardiac: RRR Pulm: clear to auscultation bilaterally, no wheezes, rales, or rhonchi Abd: soft, obese, nontender, BS present Ext: warm and well perfused, no pedal edema, +2DP B/L, moving all extremities, mild tenderness to palpation of anterior lower legs b/l. No visible abrasion,ecchymosis on extremities, no erythema. Neuro: alert and oriented X3, strength and sensation to light touch equal in bilateral upper and lower extremities, stable steady gait, walking unassisted  Assessment & Plan:  Discussed with Dr. Lynnae January

## 2014-11-14 NOTE — Progress Notes (Signed)
*  Preliminary Results* Bilateral lower extremity venous duplex completed. Bilateral lower extremities are negative for deep vein thrombosis. There is no evidence of Baker's cyst bilaterally.  Attempted to page Dr. Eula Fried at [phone] with preliminary results, however there was no call returned. The patient will be discharged and can be reached by phone if necessary.  11/14/2014  Maudry Mayhew, RVT, RDCS, RDMS

## 2014-11-14 NOTE — Patient Instructions (Addendum)
Dear [name],  Thank you for coming in today. Please get your lower extremity ultrasound done.   If you have any more chest pain or worsening swelling or pain of your legs go directly to the emergency room.   Please follow up in July with your new pcp otherwise.

## 2014-11-14 NOTE — Assessment & Plan Note (Addendum)
Stable chronic pain controlled on current regimen. Refilled percocet for next 2 months. Cautioned on possible drowsiness and to avoid driving or operating heavy machinery while on medication.  I requested a urine sample for UDS this visit; however, patient was unable to provide a sample (she did go to the bathroom to try) and then had to leave for lower extremity duplex.  -recheck UDS on follow up visit

## 2014-11-14 NOTE — Assessment & Plan Note (Signed)
Feeling more anxious lately, mom and dad both recently out of the hospital and she fell twice last week after tripping and is stressed. Also endorses occasional chest tightness with anxiety.   Refilled ativan 0.5mg  daily prn in addition to prozac for next 2 months

## 2014-11-14 NOTE — Assessment & Plan Note (Addendum)
Occasional, substernal radiates up to neck and sometimes down right arm. Usually noticed with her anxiety but also claims with severe exertion lasting a few minutes and spontaneously resolving. She has family history of heart disease, is obese, has HIV, and hyperlipidemia. Currently denied any chest pain or SOB.  EKG done in clinic today and reviewed with Dr. Cleda Clarks EKG in clinic 78bpm, NSR with TWI leads I, II, avL and upright in aVR with flattening in III but repeated shortly after, 81bpm, NSR, TWI leads III and flattening in aVF. These tracings were compared to 2012 EKG with 82bpm, T wave flattening in lead III and aVF. On the 10 year CVD risk calculator, she has a 2% risk score.   We discussed need for weight loss and she is starting to exercise more now which is encouraging. She has also been started on a statin by ID. Her chest pain likely has a significant anxiety component as well given that seems to be a trigger for her and lately she has been more stressed out than usual. We will continue to work on lifestyle modifications and we discussed alarm symptoms including worsening chest pain or constant pressure, sob, DOE, syncope to notify us right away or go to ED. We did refill her anxiety and depression medications today as well. If her symptoms persist, would consider cardiology referral and possible stress testing.

## 2014-11-16 DIAGNOSIS — M79604 Pain in right leg: Secondary | ICD-10-CM | POA: Insufficient documentation

## 2014-11-16 DIAGNOSIS — M79605 Pain in left leg: Secondary | ICD-10-CM

## 2014-11-16 NOTE — Assessment & Plan Note (Addendum)
[name] reports b/l leg pain for the past month at least and says right calf is more painful than the left. On exam she endorsed soreness on b/l anterior lower legs however no edema or erythema present on b/l legs. She endorses a hx of DVT in left groin and is concerned she may have another clot. Given her hx and reports of pain worse in right than left, we proceeded with a lower extremity duplex which was negative for DVT or baker's cyst. She did mention to the tech she would like compressions socks which I will need to discuss with her as that was not mentioned to me during the visit. Her leg pain may be due to the fact that she has started walking more and is perhaps sore from the exercise. Additionally, she reports tripping and falling last week and landing on right leg that could also be contributing to pain but based on exam today I doubt any fracture. She is able to walk on her own with steady gait and no abrasions noted on lower extremities. Will continue to monitor for now. She is using heat and ice occasionally that she can continue if providing relief and to continue exercise as tolerated. Fall precautions.

## 2014-11-18 NOTE — Progress Notes (Signed)
Internal Medicine Clinic Attending  Case discussed with Dr. Qureshi soon after the resident saw the patient.  We reviewed the resident's history and exam and pertinent patient test results.  I agree with the assessment, diagnosis, and plan of care documented in the resident's note. 

## 2014-11-19 NOTE — Progress Notes (Signed)
Patient was unable to provide urine sample during clinic visit. Will cancel uds and to get it done next visit.

## 2014-11-25 ENCOUNTER — Encounter: Payer: Self-pay | Admitting: *Deleted

## 2014-11-26 ENCOUNTER — Ambulatory Visit (INDEPENDENT_AMBULATORY_CARE_PROVIDER_SITE_OTHER): Payer: Medicaid Other | Admitting: Neurology

## 2014-11-26 ENCOUNTER — Ambulatory Visit: Payer: Medicaid Other | Admitting: Neurology

## 2014-11-26 ENCOUNTER — Encounter: Payer: Self-pay | Admitting: Neurology

## 2014-11-26 DIAGNOSIS — G43719 Chronic migraine without aura, intractable, without status migrainosus: Secondary | ICD-10-CM

## 2014-11-26 MED ORDER — TOPIRAMATE ER 50 MG PO CAP24: 50.0000 mg | ORAL_CAPSULE | Freq: Every day | ORAL | Status: DC

## 2014-11-26 MED ORDER — ZOLMITRIPTAN 5 MG PO TABS: 5.0000 mg | ORAL_TABLET | ORAL | Status: DC | PRN

## 2014-11-26 NOTE — Progress Notes (Signed)
**  Botox [id number], exp 05/2017**mck,rn

## 2014-11-26 NOTE — Progress Notes (Signed)
HPI:   [name] is a 49 year old Caucasian female with past medical history of HIV, hyperlipidemia, migraines, depression, and non-Hodgkin lymphoma in 2005 with chemotherapy and radiation.   She has history of migraines since 19. In the last 10 years, she reports having daily migraines. She is currently taking Topamax and Zomig with relief. She takes approximately 20-25 tablets of her Zomig per month.    She has aura's that look like floating dirt particles, flashing lights, and she sees Christmas tree lights when they are severe, she has nausea, vomiting and sometimes diarrhea when they're severe. She also complains of diplopia, blurry vision, and  photophobia. She also has had a lightning bolt sensation throughout her body.  She describes them as pulsating and throbbing  on a scale of 5-6/10 which she considers mild, when they are severe they can go as high as 10/10. They usually start in the right temporal area, but will also start on the left temporal and are worse.   Any type of smoke or MSG can precipitate a migraine. They generally last 2-9 days. She has been to the emergency department twice in the last year for migraines. She states "I just can't do this anymore I need to do something about her migraines"  She has tried in the past Maxalt, Imitrex, atenolol, verapamil, nortriptyline, Inderal, Depakote without benefit. Atenolol and verapamil caused hypotension. Depakote caused severe vomiting. Topamax causing her worsening headaches, I also put her on Zonegran, she denies significant improvement.  I started to give her BOTOX injection since May 2013, she responded very well,   Previous MRI brain was normal in 2013.  UPDATE May 25th 2016: Last Botox injection was in Feb 2016, complains of soreness all over her head, lasting for 3 days, which has helped her headaches, she still gets headaches at least 6 each month, she is taking Zomig as needed, which has been helpful,  She has tried Inderal as  preventive medication, complains of fatigue, at the same time difficulty falling to sleep, verapamil was helpful to her migraine the past I started verapamil 120 mg every day since February, which did not help, she is no longer taking it.  Physical Exam  General: Well groomed and obese female Neck: supple no carotid bruits Respiratory: clear to auscultation bilaterally Cardiovascular: regular rate rhythm She wear right knee brace  Neurologic Exam  Mental Status: obese, pleasant, awake, alert, cooperative to history, talking, and casual conversation. Cranial Nerves: CN II-XII pupils were equal round reactive to light. Extraocular movements were full.  Visual fields were full on confrontational test.  Facial sensation and strength were normal.   Uvula tongue were midline.  Head turning and shoulder shrugging were normal and symmetric.  Tongue protrusion into the cheeks strength were normal.  Motor: Normal tone, bulk, and strength. Sensory: Normal to light touch, pinprick, proprioception except decreased vibratory sensation to her right lower extremity she relates to peripheral neuropathy.  Decreased vibratory sensation to left upper extremity.   Coordination: There was no dysmetria noticed. Gait and Station: Narrow based and steady,  Reflexes: Deep tendon reflexes: present and symmetric.  Plantar responses are flexor.   Assessment and plan: 49 year-old Caucasian female with a history of HIV, hypercholesterolemia, Non hodgkin' lymphoma with chemotherapy and radiation and migraines since age 49.   She has tried and failed multiple preventive medications including atenolol, verapamil Maxalt, Imitrex, nortriptyline, and Depakote,Topamax,   She has migraines daily for more than  10 years. She is currently taking  zonogram  and Zomig with some relief.   Normal MRI brain, and neurological exam.  BOTOX injection was performed according to protocol by [employer]. 100 units of BOTOX was dissolved into 2  cc NS.   Total of 200 units  Corrugator 2 sites, 10 units Procerus 1 site, 5 unit Frontalis 4 sites,  20 units, Temporalis 8 sites,  40 units  Occipitalis 6 sites, 30 units Cervical Paraspinal, 4 sites, 20 units Trapezius, 6 sites, 30 units  Extra 45 units to cervical paraspinals  Patient tolerate the injection well. Will return for repeat injection in 3 months.  She continue have frequent headaches, despite repeat Botox injection,  I have add on Topamax ER 50 mg every day, Continue Zomig as needed

## 2014-12-10 ENCOUNTER — Telehealth: Payer: Self-pay

## 2014-12-10 NOTE — Telephone Encounter (Signed)
Called patient left message patient needs to be scheduled first of September.

## 2014-12-17 ENCOUNTER — Other Ambulatory Visit: Payer: Self-pay

## 2014-12-17 MED ORDER — ESOMEPRAZOLE MAGNESIUM 40 MG PO CPDR: 40.0000 mg | DELAYED_RELEASE_CAPSULE | Freq: Two times a day (BID) | ORAL | Status: DC

## 2014-12-19 ENCOUNTER — Other Ambulatory Visit: Payer: Self-pay | Admitting: Internal Medicine

## 2014-12-19 MED ORDER — MOMETASONE FURO-FORMOTEROL FUM 100-5 MCG/ACT IN AERO: INHALATION_SPRAY | RESPIRATORY_TRACT | Status: DC

## 2015-01-21 ENCOUNTER — Other Ambulatory Visit: Payer: Self-pay | Admitting: *Deleted

## 2015-01-21 ENCOUNTER — Telehealth: Payer: Self-pay | Admitting: Internal Medicine

## 2015-01-21 DIAGNOSIS — G894 Chronic pain syndrome: Secondary | ICD-10-CM

## 2015-01-21 NOTE — Telephone Encounter (Signed)
Patient requesting refill for pain med she picks up here in clinic

## 2015-01-21 NOTE — Telephone Encounter (Signed)
Last filled at [facility] 12/24/2014 Last uds 2014 Last appt in [facility] 08/08/2014, no future appt scheduled, will schedule when pt picks up script

## 2015-01-23 MED ORDER — OXYCODONE-ACETAMINOPHEN 5-325 MG PO TABS: 1.0000 | ORAL_TABLET | Freq: Four times a day (QID) | ORAL | Status: DC | PRN

## 2015-01-23 NOTE — Telephone Encounter (Signed)
Rx ready to be picked up; pt called and message left/

## 2015-01-23 NOTE — Telephone Encounter (Signed)
Call from pt - requesting refill on Percocet; I will inform Dr Redmond Pulling.

## 2015-01-26 NOTE — Telephone Encounter (Signed)
Done, pt notified.

## 2015-01-27 ENCOUNTER — Telehealth: Payer: Self-pay | Admitting: Internal Medicine

## 2015-01-27 NOTE — Telephone Encounter (Signed)
Call to patient to confirm appointment for 01/28/15 at 2:15 lmtcb

## 2015-01-28 ENCOUNTER — Ambulatory Visit (INDEPENDENT_AMBULATORY_CARE_PROVIDER_SITE_OTHER): Payer: Medicaid Other | Admitting: Internal Medicine

## 2015-01-28 ENCOUNTER — Encounter: Payer: Self-pay | Admitting: Internal Medicine

## 2015-01-28 ENCOUNTER — Ambulatory Visit (HOSPITAL_COMMUNITY)
Admission: RE | Admit: 2015-01-28 | Discharge: 2015-01-28 | Disposition: A | Discharge status: Home / Self Care | Payer: Medicaid Other | Source: Ambulatory Visit | Attending: Internal Medicine | Admitting: Internal Medicine

## 2015-01-28 VITALS — BP 126/86 | HR 101 | Temp 97.6°F | Ht 66.0 in | Wt 262.5 lb

## 2015-01-28 DIAGNOSIS — R300 Dysuria: Secondary | ICD-10-CM

## 2015-01-28 DIAGNOSIS — Z79891 Long term (current) use of opiate analgesic: Secondary | ICD-10-CM

## 2015-01-28 DIAGNOSIS — R000 Tachycardia, unspecified: Secondary | ICD-10-CM | POA: Insufficient documentation

## 2015-01-28 DIAGNOSIS — F32A Depression, unspecified: Secondary | ICD-10-CM

## 2015-01-28 DIAGNOSIS — F329 Major depressive disorder, single episode, unspecified: Secondary | ICD-10-CM

## 2015-01-28 DIAGNOSIS — Z21 Asymptomatic human immunodeficiency virus [HIV] infection status: Secondary | ICD-10-CM | POA: Diagnosis not present

## 2015-01-28 DIAGNOSIS — B20 Human immunodeficiency virus [HIV] disease: Secondary | ICD-10-CM

## 2015-01-28 DIAGNOSIS — G43919 Migraine, unspecified, intractable, without status migrainosus: Secondary | ICD-10-CM

## 2015-01-28 DIAGNOSIS — R6889 Other general symptoms and signs: Secondary | ICD-10-CM | POA: Diagnosis not present

## 2015-01-28 DIAGNOSIS — G43719 Chronic migraine without aura, intractable, without status migrainosus: Secondary | ICD-10-CM

## 2015-01-28 DIAGNOSIS — F418 Other specified anxiety disorders: Secondary | ICD-10-CM | POA: Diagnosis not present

## 2015-01-28 DIAGNOSIS — G894 Chronic pain syndrome: Secondary | ICD-10-CM

## 2015-01-28 DIAGNOSIS — F419 Anxiety disorder, unspecified: Secondary | ICD-10-CM

## 2015-01-28 DIAGNOSIS — R638 Other symptoms and signs concerning food and fluid intake: Secondary | ICD-10-CM

## 2015-01-28 LAB — BASIC METABOLIC PANEL WITH GFR
BUN: 10 mg/dL (ref 7–25)
CALCIUM: 9.4 mg/dL (ref 8.6–10.2)
CO2: 28 meq/L (ref 20–31)
Chloride: 99 mEq/L (ref 98–110)
Creat: 0.95 mg/dL (ref 0.50–1.10)
GFR, EST NON AFRICAN AMERICAN: 71 mL/min (ref >=60)
GFR, Est African American: 82 mL/min (ref >=60)
Glucose, Bld: 90 mg/dL (ref 65–99)
POTASSIUM: 4 meq/L (ref 3.5–5.3)
Sodium: 138 mEq/L (ref 135–146)

## 2015-01-28 LAB — TSH: TSH: 1.9 u[IU]/mL (ref 0.350–4.500)

## 2015-01-28 MED ORDER — OXYCODONE-ACETAMINOPHEN 5-325 MG PO TABS: 1.0000 | ORAL_TABLET | Freq: Four times a day (QID) | ORAL | Status: DC | PRN

## 2015-01-28 MED ORDER — LORAZEPAM 0.5 MG PO TABS: 0.5000 mg | ORAL_TABLET | Freq: Every day | ORAL | Status: DC | PRN

## 2015-01-28 NOTE — Patient Instructions (Signed)
1. I will call you if there are problem with your labs.  Please come back to see me in mid September.   2. Please take all medications as prescribed.    3. If you have worsening of your symptoms or new symptoms arise, please call the clinic ([phone]), or go to the ER immediately if symptoms are severe.

## 2015-01-28 NOTE — Progress Notes (Signed)
Subjective:    Patient ID: [name], female    DOB: 1965/11/24, 49 y.o.   MRN: [id number]  HPI Comments: [name] is a 49 year old woman with PMH as below here for follow-up of chronic conditions.  Please see problem based charting for assessment and plan.     Past Medical History  Diagnosis Date  . Anemia 10/2009    macrocytic anemia with baseline MCV 104-106  . PUD (peptic ulcer disease) 07/1997    per EGD report 07/1997 with history of esophagitis  . Migraines     on topamax and triptans, frequent (almost daily) attacks   . Hyperlipidemia   . HIV infection     CD4 = 570 (05/2010), VL undetectation  . Anxiety   . Spondylolisthesis of lumbar region     L5-S1  . Gastroparesis   . Shingles     in lumbar dermatome  . Hypertension     no meds  . Non Hodgkin's lymphoma     stage II, s/p resection, chemotherapy, radiotherapy, Dr. Beryle Beams is her oncologist.   . Asthma   . DVT (deep venous thrombosis)     history clot lt groin when she had lymphoma-in remision now.  . Traumatic tear of lateral meniscus of right knee 04/20/2012  . 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 Hiatal hernia 01/01/2002  . Acute gastritis without mention of hemorrhage 01/01/2002  . Duodenitis without mention of hemorrhage 01/01/2002  . GERD (gastroesophageal reflux disease)   . IBS (irritable bowel syndrome)    Current Outpatient Prescriptions on File Prior to Visit  Medication Sig Dispense Refill  . Beclomethasone Dipropionate 80 MCG/ACT AERS Place 2 Inhalers into the nose daily. 8.7 g 10  . botulinum toxin Type A (BOTOX) 100 UNITS SOLR injection INJECT 200 UNITS IM INTO  HEAD NECK EVERY 3 MONTHS    . Calcium Carbonate-Vitamin D (CALCIUM-VITAMIN D) 500-200 MG-UNIT per tablet Take 2 tablets by mouth daily.     Marland Kitchen esomeprazole (NEXIUM) 40 MG capsule Take 1 capsule (40 mg total) by mouth 2 (two) times daily before a meal. 60 capsule 11  . FLUoxetine HCl 60 MG TABS Take 60 mg by mouth daily. 30 tablet  2  . LORazepam (ATIVAN) 0.5 MG tablet Take 1 tablet (0.5 mg total) by mouth daily as needed for anxiety. 30 tablet 1  . mometasone-formoterol (DULERA) 100-5 MCG/ACT AERO Take 2 puffs first thing in am and then another 2 puffs about 12 hours later. 1 Inhaler 0  . oxyCODONE-acetaminophen (PERCOCET/ROXICET) 5-325 MG per tablet Take 1 tablet by mouth every 6 (six) hours as needed for severe pain. 120 tablet 0  . PREZCOBIX 800-150 MG per tablet TAKE 1 TABLET BY MOUTH EVERY DAY 30 tablet 5  . PROAIR HFA 108 (90 BASE) MCG/ACT inhaler INHALE 1 TO 2 PUFFS EVERY 4 HOURS AS NEEDED FOR WHEEZING & SHORTNESS OF BREATH 8.5 g 2  . promethazine (PHENERGAN) 50 MG tablet TAKE 1/2 TO 1 TABLET BY MOUTH EVERY 6 HOURS AS NEEDED NAUSEA 90 tablet 3  . rosuvastatin (CRESTOR) 10 MG tablet Take 1 tablet (10 mg total) by mouth daily. 30 tablet 5  . scopolamine (TRANSDERM-SCOP) 1 MG/3DAYS Place 1 patch (1.5 mg total) onto the skin every other day as needed. 15 patch 6  . Tamsulosin HCl (FLOMAX) 0.4 MG CAPS Take 0.4 mg by mouth daily. 1-2 daily    . Topiramate ER 50 MG CP24 Take 50 mg by mouth at bedtime. [place]  capsule 11  . TRUVADA 200-300 MG per tablet TAKE 1 TABLET BY MOUTH EVERY DAY 30 tablet 11  . VOLTAREN 1 % GEL APPLY TO AFFECTED AREA TWICE A DAY FOR PAIN 100 g 1  . zolmitriptan (ZOMIG) 5 MG tablet Take 1 tablet (5 mg total) by mouth as needed for migraine. 15 tablet 11   No current facility-administered medications on file prior to visit.    Review of Systems  Constitutional: Negative for fever, chills and unexpected weight change.  HENT: Positive for postnasal drip, rhinorrhea and sore throat.   Respiratory: Negative for shortness of breath and wheezing.        Uses rescue inhaler about 3 times per week.  Gastrointestinal: Positive for constipation. Negative for abdominal pain, diarrhea and blood in stool.       Occasional constipation responds to Senokot.  Genitourinary: Positive for dysuria. Negative for frequency  and difficulty urinating.  Neurological: Positive for headaches.       Migraines improving with botox  Psychiatric/Behavioral: Negative for suicidal ideas and dysphoric mood.       Filed Vitals:   01/28/15 1441 01/28/15 1637  BP: 132/95 126/86  Pulse: 101 101  Temp: 97.6 F (36.4 C)   TempSrc: Oral   Height: 5\' 6"  (1.676 m)   Weight: 262 lb 8 oz (119.069 kg)   SpO2: 100%      Objective:   Physical Exam  Constitutional: She is oriented to person, place, and time. She appears well-developed. No distress.  HENT:  Head: Normocephalic and atraumatic.  Mouth/Throat: Oropharynx is clear and moist. No oropharyngeal exudate.  Eyes: EOM are normal. Pupils are equal, round, and reactive to light.  Neck: Neck supple.  Cardiovascular: Normal rate, regular rhythm and normal heart sounds.  Exam reveals no gallop and no friction rub.   No murmur heard. Pulmonary/Chest: Breath sounds normal. No respiratory distress. She has no wheezes. She has no rales.  Abdominal: Soft. Bowel sounds are normal. She exhibits no distension and no mass. There is no tenderness. There is no rebound and no guarding.  Musculoskeletal: Normal range of motion. She exhibits no edema or tenderness.  Neurological: She is alert and oriented to person, place, and time. No cranial nerve deficit.  Skin: Skin is warm. She is not diaphoretic.  Psychiatric: She has a normal mood and affect. Her behavior is normal.  Vitals reviewed.         Assessment & Plan:  Please see problem based charting for assessment and plan.

## 2015-01-29 LAB — URINALYSIS, ROUTINE W REFLEX MICROSCOPIC
BILIRUBIN URINE: NEGATIVE
Glucose, UA: NEGATIVE
HGB URINE DIPSTICK: NEGATIVE
Ketones, ur: NEGATIVE
Leukocytes, UA: NEGATIVE
NITRITE: NEGATIVE
PROTEIN: NEGATIVE
Specific Gravity, Urine: 1.001 (ref 1.001–1.035)
pH: 6 (ref 5.0–8.0)

## 2015-01-29 LAB — PRESCRIPTION ABUSE MONITORING 15P, URINE
Amphetamine/Meth: NEGATIVE ng/mL
BARBITURATE SCREEN, URINE: NEGATIVE ng/mL
BUPRENORPHINE, URINE: NEGATIVE ng/mL
CANNABINOID SCRN UR: NEGATIVE ng/mL
Carisoprodol, Urine: NEGATIVE ng/mL
Cocaine Metabolites: NEGATIVE ng/mL
Creatinine, Urine: 30.93 mg/dL (ref >20.0)
FENTANYL URINE: NEGATIVE ng/mL
Meperidine, Ur: NEGATIVE ng/mL
Methadone Screen, Urine: NEGATIVE ng/mL
Opiate Screen, Urine: NEGATIVE ng/mL
Propoxyphene: NEGATIVE ng/mL
TRAMADOL UR: NEGATIVE ng/mL
ZOLPIDEM, URINE: NEGATIVE ng/mL

## 2015-01-30 DIAGNOSIS — R300 Dysuria: Secondary | ICD-10-CM | POA: Insufficient documentation

## 2015-01-30 DIAGNOSIS — R638 Other symptoms and signs concerning food and fluid intake: Secondary | ICD-10-CM | POA: Insufficient documentation

## 2015-01-30 DIAGNOSIS — R000 Tachycardia, unspecified: Secondary | ICD-10-CM | POA: Insufficient documentation

## 2015-01-30 NOTE — Progress Notes (Signed)
Medicine attending: Medical history, presenting problems, physical findings, and medications, reviewed with Dr Alex Wilson on the day of the patient visit   and I concur with her evaluation and management plan. 

## 2015-01-30 NOTE — Assessment & Plan Note (Signed)
When I ask her to give urine for UDS she requested I also check for UTI.  I explained that if she is not having symptoms we do not need to check for UTI.  She then tells me she is having dysuria. - UA - negative

## 2015-01-30 NOTE — Assessment & Plan Note (Addendum)
Feels her symptoms are stable on Prozac 60mg  daily and 0.5mg  Ativan prn (she uses daily).  She says was originally on Xanax and tapered to this low dose of Ativan.  When I discuss tapering further she is resistant.  I explained that this is not a long term medication.  She also tells me she is not interested in going to therapy.   - continue Prozac 60mg  daily - refilled Ativan 0.5mg  daily prn #30, + 1 refill rx to be fill on or after Feb 28, 2015. - RTC in 2 months - will continue to offer counseling - will continue to taper off Ativan

## 2015-01-30 NOTE — Assessment & Plan Note (Signed)
Stable on Prozac.  Denies SI. - continue Prozac 60mg  daily

## 2015-01-30 NOTE — Assessment & Plan Note (Addendum)
She tells me she has been craving salt lately.  No hypotension, hypoglycemia or hyperpigmentation to suggest adrenal insufficiency.  She says she has been staying hydrated in the heat (drinking Gatorade). - BMP to check electrolytes

## 2015-01-30 NOTE — Assessment & Plan Note (Signed)
Stable on current regimen.  She says she needs to Percocet q6h to function. - UDS this visit - wanted to sign new pain contract this visit but visit ran over; will get contract signed at next visit - attempt to wean down in the future

## 2015-01-30 NOTE — Assessment & Plan Note (Signed)
Well controlled and reports compliance with HAART.  Follows with [employer] and scheduled to see him next month.

## 2015-01-30 NOTE — Assessment & Plan Note (Signed)
She notes some improvement with Botox.

## 2015-01-30 NOTE — Assessment & Plan Note (Addendum)
EKG NSR.  Does not seem dehydrated.   Possibly anxiety related. - check BMP, TSH

## 2015-02-03 LAB — BENZODIAZEPINES (GC/LC/MS), URINE
ALPRAZOLAMU: NEGATIVE ng/mL (ref <25)
Clonazepam metabolite (GC/LC/MS), ur confirm: NEGATIVE ng/mL (ref <25)
FLURAZEPAMU: NEGATIVE ng/mL (ref <50)
Lorazepam (GC/LC/MS), ur confirm: 280 ng/mL — ABNORMAL HIGH (ref <50)
MIDAZOLAMU: NEGATIVE ng/mL (ref <50)
NORDIAZEPAMU: NEGATIVE ng/mL (ref <50)
Oxazepam (GC/LC/MS), ur confirm: NEGATIVE ng/mL (ref <50)
Temazepam (GC/LC/MS), ur confirm: NEGATIVE ng/mL (ref <50)
Triazolam metabolite (GC/LC/MS), ur confirm: NEGATIVE ng/mL (ref <50)

## 2015-02-03 LAB — OXYCODONE, URINE (LC/MS-MS)
Noroxycodone, Ur: 302 ng/mL — ABNORMAL HIGH (ref <50)
Oxycodone, ur: 328 ng/mL — ABNORMAL HIGH (ref <50)
Oxymorphone: NEGATIVE ng/mL (ref <50)

## 2015-02-16 ENCOUNTER — Other Ambulatory Visit: Payer: Medicaid Other

## 2015-02-19 ENCOUNTER — Other Ambulatory Visit: Payer: Self-pay | Admitting: Neurology

## 2015-02-19 ENCOUNTER — Other Ambulatory Visit: Payer: Self-pay | Admitting: *Deleted

## 2015-02-19 MED ORDER — FLUOXETINE HCL 60 MG PO TABS: 60.0000 mg | ORAL_TABLET | Freq: Every day | ORAL | Status: DC

## 2015-02-20 NOTE — Telephone Encounter (Signed)
Per note from 05/14

## 2015-02-25 ENCOUNTER — Telehealth: Payer: Self-pay

## 2015-02-25 NOTE — Telephone Encounter (Signed)
Called pt to confirm she will keep scheduled Botox appt due to cancellations and no shows in the past. No answer.

## 2015-03-02 ENCOUNTER — Ambulatory Visit: Payer: Medicaid Other | Admitting: Infectious Disease

## 2015-03-02 ENCOUNTER — Encounter: Payer: Self-pay | Admitting: *Deleted

## 2015-03-02 NOTE — Telephone Encounter (Signed)
Patient called back and I spoke with her. Initially she requested to change her apt time but when I gave her available apt times to r/s, she decided to keep the current apt on 9/1.

## 2015-03-03 ENCOUNTER — Encounter: Payer: Self-pay | Admitting: Internal Medicine

## 2015-03-03 ENCOUNTER — Encounter: Payer: Medicaid Other | Admitting: Internal Medicine

## 2015-03-03 ENCOUNTER — Other Ambulatory Visit: Payer: Medicaid Other

## 2015-03-03 DIAGNOSIS — Z79899 Other long term (current) drug therapy: Secondary | ICD-10-CM

## 2015-03-03 DIAGNOSIS — B20 Human immunodeficiency virus [HIV] disease: Secondary | ICD-10-CM

## 2015-03-03 DIAGNOSIS — Z113 Encounter for screening for infections with a predominantly sexual mode of transmission: Secondary | ICD-10-CM

## 2015-03-03 LAB — CBC WITH DIFFERENTIAL/PLATELET
BASOS ABS: 0 10*3/uL (ref 0.0–0.1)
Basophils Relative: 0 % (ref 0–1)
EOS ABS: 0.1 10*3/uL (ref 0.0–0.7)
EOS PCT: 1 % (ref 0–5)
HCT: 40.2 % (ref 36.0–46.0)
Hemoglobin: 13.3 g/dL (ref 12.0–15.0)
LYMPHS PCT: 36 % (ref 12–46)
Lymphs Abs: 2.1 10*3/uL (ref 0.7–4.0)
MCH: 26.8 pg (ref 26.0–34.0)
MCHC: 33.1 g/dL (ref 30.0–36.0)
MCV: 81 fL (ref 78.0–100.0)
MPV: 8.6 fL (ref 8.6–12.4)
Monocytes Absolute: 0.3 10*3/uL (ref 0.1–1.0)
Monocytes Relative: 6 % (ref 3–12)
Neutro Abs: 3.3 10*3/uL (ref 1.7–7.7)
Neutrophils Relative %: 57 % (ref 43–77)
PLATELETS: 218 10*3/uL (ref 150–400)
RBC: 4.96 MIL/uL (ref 3.87–5.11)
RDW: 15.6 % — AB (ref 11.5–15.5)
WBC: 5.8 10*3/uL (ref 4.0–10.5)

## 2015-03-03 LAB — COMPLETE METABOLIC PANEL WITH GFR
ALT: 13 U/L (ref 6–29)
AST: 15 U/L (ref 10–35)
Albumin: 4.1 g/dL (ref 3.6–5.1)
Alkaline Phosphatase: 68 U/L (ref 33–115)
BILIRUBIN TOTAL: 0.3 mg/dL (ref 0.2–1.2)
BUN: 13 mg/dL (ref 7–25)
CALCIUM: 9.2 mg/dL (ref 8.6–10.2)
CHLORIDE: 100 mmol/L (ref 98–110)
CO2: 27 mmol/L (ref 20–31)
CREATININE: 1.01 mg/dL (ref 0.50–1.10)
GFR, EST AFRICAN AMERICAN: 76 mL/min (ref >=60)
GFR, Est Non African American: 66 mL/min (ref >=60)
Glucose, Bld: 96 mg/dL (ref 65–99)
Potassium: 4.1 mmol/L (ref 3.5–5.3)
Sodium: 137 mmol/L (ref 135–146)
Total Protein: 7 g/dL (ref 6.1–8.1)

## 2015-03-03 LAB — LIPID PANEL
CHOLESTEROL: 270 mg/dL — AB (ref 125–200)
HDL: 39 mg/dL — AB (ref >=46)
TRIGLYCERIDES: 505 mg/dL — AB (ref <150)
Total CHOL/HDL Ratio: 6.9 Ratio — ABNORMAL HIGH (ref <=5.0)

## 2015-03-04 LAB — HIV-1 RNA QUANT-NO REFLEX-BLD
HIV 1 RNA Quant: <20 copies/mL (ref <20)
HIV-1 RNA Quant, Log: <1.3 {Log} (ref <1.30)

## 2015-03-04 LAB — T-HELPER CELL (CD4) - (RCID CLINIC ONLY)
CD4 % Helper T Cell: 35 % (ref 33–55)
CD4 T CELL ABS: 790 /uL (ref 400–2700)

## 2015-03-04 LAB — HEPATITIS C ANTIBODY: HCV Ab: NEGATIVE

## 2015-03-05 ENCOUNTER — Ambulatory Visit: Payer: Self-pay | Admitting: Neurology

## 2015-03-05 ENCOUNTER — Ambulatory Visit (INDEPENDENT_AMBULATORY_CARE_PROVIDER_SITE_OTHER): Payer: Medicaid Other | Admitting: Neurology

## 2015-03-05 ENCOUNTER — Encounter: Payer: Self-pay | Admitting: Neurology

## 2015-03-05 VITALS — BP 118/78 | HR 74 | Ht 66.0 in | Wt 259.0 lb

## 2015-03-05 DIAGNOSIS — G43719 Chronic migraine without aura, intractable, without status migrainosus: Secondary | ICD-10-CM

## 2015-03-05 NOTE — Progress Notes (Signed)
**  Botox, Lot [id number], Exp 10/2017, [id number]**VUY, rn

## 2015-03-05 NOTE — Progress Notes (Signed)
HPI:   [name] is a 49 year old Caucasian female with past medical history of HIV, hyperlipidemia, migraines, depression, and non-Hodgkin lymphoma in 2005 with chemotherapy and radiation.   She has history of migraines since 19. In the last 10 years, she reports having daily migraines. She is currently taking Topamax and Zomig with relief. She takes approximately 20-25 tablets of her Zomig per month.    She has aura's that look like floating dirt particles, flashing lights, and she sees Christmas tree lights when they are severe, she has nausea, vomiting and sometimes diarrhea when they're severe. She also complains of diplopia, blurry vision, and  photophobia. She also has had a lightning bolt sensation throughout her body.  She describes them as pulsating and throbbing  on a scale of 5-6/10 which she considers mild, when they are severe they can go as high as 10/10. They usually start in the right temporal area, but will also start on the left temporal and are worse.   Any type of smoke or MSG can precipitate a migraine. They generally last 2-9 days. She has been to the emergency department twice in the last year for migraines. She states "I just can't do this anymore I need to do something about her migraines"  She has tried and failed different preventive medications:  atenolol, verapamil, nortriptyline, Inderal, Depakote without benefit. Atenolol and verapamil caused hypotension. Depakote caused severe vomiting. Topamax causing her worsening headaches, will show tried Zonegran, Topamax ER, she denies significant improvement.  For abortive treatment, she has tried Imitrex, Maxalt with suboptimal response, Zomig as needed since to work best,  she also takes transderm scophalamine patch for nause  I started to give her BOTOX injection since May 2013, every 3 months, she responded very well,   Previous MRI brain was normal in 2013.  UPDATE May 25th 2016: Last Botox injection was in Feb 2016,  complains of soreness all over her head, lasting for 3 days, which has helped her headaches, she still gets headaches at least 6 each month, she is taking Zomig as needed, which has been helpful,  She has tried Inderal as preventive medication, complains of fatigue, at the same time difficulty falling to sleep, verapamil was helpful to her migraine the past I started verapamil 120 mg every day since February, which did not help, she is no longer taking it.  UPDATE Mar 05 2015: She is under a lot stress, is tearful during today's interview, her father passed away in Feb 07, 2015, she had frequent headaches over the past few weeks, but overall, previous Botox injection in Nov 26 2014 has been very helpful, she gets milder headaches has been taking frequent aspirin, also Zomig as needed, she also takes transderm scophalamine patch for nause    Physical Exam  General: Well groomed and obese female Neck: supple no carotid bruits Respiratory: clear to auscultation bilaterally Cardiovascular: regular rate rhythm She wear right knee brace  Neurologic Exam  Mental Status: obese, pleasant, awake, alert, cooperative to history, talking, and casual conversation. Cranial Nerves: CN II-XII pupils were equal round reactive to light. Extraocular movements were full.  Visual fields were full on confrontational test.  Facial sensation and strength were normal.   Uvula tongue were midline.  Head turning and shoulder shrugging were normal and symmetric.  Tongue protrusion into the cheeks strength were normal.  Motor: Normal tone, bulk, and strength. Sensory: Normal to light touch, pinprick, proprioception except decreased vibratory sensation to her right lower extremity she relates to  peripheral neuropathy.  Decreased vibratory sensation to left upper extremity.   Coordination: There was no dysmetria noticed. Gait and Station: Narrow based and steady,  Reflexes: Deep tendon reflexes: present and symmetric.  Plantar  responses are flexor.   Assessment and plan: 49 year-old Caucasian female with a history of HIV, hypercholesterolemia, Non hodgkin' lymphoma with chemotherapy and radiation and migraines since age 38.   She has tried and failed multiple preventive medications including atenolol, verapamil, nortriptyline, and Depakote,Topamax, Topamax ER,   She has migraines daily for more than  10 years. She is currently taking  zonogram  and Zomig with some relief. Previously tried imitrex and Maxalt with suboptimal response,  Normal MRI brain, and neurological exam.  BOTOX injection was performed according to protocol by [employer]. 100 units of BOTOX was dissolved into 2 cc NS.   Total of 200 units  Frontalis 4 sites,  20 units, Temporalis 8 sites,  40 units  Occipitalis 6 sites, 30 units Cervical Paraspinal, 4 sites, 20 units Trapezius, 6 sites, 30 units  Extra 60 units to cervical paraspinals  Patient tolerate the injection well. Will return for repeat injection in 3 months.  Continue Zomig as needed  Marcial Pacas, M.D. Ph.D.  [facility] [place], [place] [address] Phone: [phone] Fax:      [phone]

## 2015-03-10 ENCOUNTER — Telehealth: Payer: Self-pay | Admitting: Neurology

## 2015-03-10 NOTE — Telephone Encounter (Signed)
Patient requested during check out that we call her to set up next botox apt instead of scheduling with check out staff. Called the patient to schedule next apt but she did not answer. Left her a message requesting for her to return my call.   CALL CENTER: If she returns the call you can schedule her for 3 months out. Thank you!

## 2015-03-18 ENCOUNTER — Other Ambulatory Visit: Payer: Self-pay | Admitting: Infectious Disease

## 2015-03-18 DIAGNOSIS — B20 Human immunodeficiency virus [HIV] disease: Secondary | ICD-10-CM

## 2015-03-25 ENCOUNTER — Encounter: Payer: Self-pay | Admitting: Infectious Disease

## 2015-03-25 ENCOUNTER — Ambulatory Visit (INDEPENDENT_AMBULATORY_CARE_PROVIDER_SITE_OTHER): Payer: Medicaid Other | Admitting: Infectious Disease

## 2015-03-25 VITALS — BP 117/80 | HR 74 | Temp 98.1°F | Ht 65.0 in | Wt 258.0 lb

## 2015-03-25 DIAGNOSIS — B20 Human immunodeficiency virus [HIV] disease: Secondary | ICD-10-CM

## 2015-03-25 DIAGNOSIS — B373 Candidiasis of vulva and vagina: Secondary | ICD-10-CM

## 2015-03-25 DIAGNOSIS — F331 Major depressive disorder, recurrent, moderate: Secondary | ICD-10-CM | POA: Diagnosis not present

## 2015-03-25 DIAGNOSIS — M222X1 Patellofemoral disorders, right knee: Secondary | ICD-10-CM

## 2015-03-25 DIAGNOSIS — F419 Anxiety disorder, unspecified: Secondary | ICD-10-CM

## 2015-03-25 DIAGNOSIS — E781 Pure hyperglyceridemia: Secondary | ICD-10-CM | POA: Diagnosis not present

## 2015-03-25 DIAGNOSIS — G609 Hereditary and idiopathic neuropathy, unspecified: Secondary | ICD-10-CM

## 2015-03-25 DIAGNOSIS — M222X2 Patellofemoral disorders, left knee: Secondary | ICD-10-CM

## 2015-03-25 DIAGNOSIS — G43719 Chronic migraine without aura, intractable, without status migrainosus: Secondary | ICD-10-CM | POA: Diagnosis not present

## 2015-03-25 DIAGNOSIS — Z6841 Body Mass Index (BMI) 40.0 and over, adult: Secondary | ICD-10-CM

## 2015-03-25 DIAGNOSIS — B3731 Acute candidiasis of vulva and vagina: Secondary | ICD-10-CM

## 2015-03-25 DIAGNOSIS — Z23 Encounter for immunization: Secondary | ICD-10-CM

## 2015-03-25 DIAGNOSIS — G894 Chronic pain syndrome: Secondary | ICD-10-CM

## 2015-03-25 MED ORDER — FLUCONAZOLE 100 MG PO TABS: 100.0000 mg | ORAL_TABLET | Freq: Every day | ORAL | Status: DC

## 2015-03-25 MED ORDER — EMTRICITABINE-TENOFOVIR AF 200-25 MG PO TABS: 1.0000 | ORAL_TABLET | Freq: Every day | ORAL | Status: DC

## 2015-03-25 NOTE — Progress Notes (Signed)
Subjective:    Patient ID: [name], female    DOB: 08-09-1965, 49 y.o.   MRN: [id number]  HPI   [name] is a 49 y.o. female who is doing superbly well on her  antiviral regimen, Prezcobix and Truvada with undetectable viral load and health cd4 count. She has been continuing to receive botox injections for her intractable migraine headaches. She has multiple questions today about her cholesterol her statin and we had extensive discussion re nature of HIV and HIV cure research, reservoir and inflammation.   She states that she has been suffering from recurrent vaginal yeast infections and requested 10 day supply of fluconazole with refills.  Lab Results  Component Value Date   HIV1RNAQUANT <20 03/03/2015   Lab Results  Component Value Date   CD4TABS 790 03/03/2015   CD4TABS 680 09/01/2014   CD4TABS 810 04/23/2014      Past Medical History  Diagnosis Date  . Anemia 10/2009    macrocytic anemia with baseline MCV 104-106  . PUD (peptic ulcer disease) 07/1997    per EGD report 07/1997 with history of esophagitis  . Migraines     on topamax and triptans, frequent (almost daily) attacks   . Hyperlipidemia   . HIV infection     CD4 = 570 (05/2010), VL undetectation  . Anxiety   . Spondylolisthesis of lumbar region     L5-S1  . Gastroparesis   . Shingles     in lumbar dermatome  . Hypertension     no meds  . Non Hodgkin's lymphoma     stage II, s/p resection, chemotherapy, radiotherapy, Dr. Beryle Beams is her oncologist.   . Asthma   . DVT (deep venous thrombosis)     history clot lt groin when she had lymphoma-in remision now.  . Traumatic tear of lateral meniscus of right knee 04/20/2012  . 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 Hiatal hernia 01/01/2002  . Acute gastritis without mention of hemorrhage 01/01/2002  . Duodenitis without mention of hemorrhage 01/01/2002  . GERD (gastroesophageal reflux disease)   . IBS (irritable bowel syndrome)    Past Surgical  History  Procedure Laterality Date  . Upper gastrointestinal endoscopy  2003, 2007    done by Dr. Velora Heckler  . Tonsillectomy    . Endometrial ablation    . Tympanostomy tube placement      as child-both  . Tympanoplasty Left   . Cholecystectomy    . Knee arthroscopy Right 04/20/12   Family History  Problem Relation Age of Onset  . Hypertension Mother   . Stroke Mother   . Heart disease Mother   . Breast cancer Paternal Grandmother   . Kidney cancer Maternal Uncle   . Heart disease Maternal Uncle   . Diabetes Maternal Uncle   . Diabetes Maternal Grandmother   . Heart disease Maternal Grandmother   . Emphysema Paternal Uncle     never smoker  . Emphysema Paternal Grandfather     never smoker  . Colon cancer Paternal Aunt     with met to lung   . Non-Hodgkin's lymphoma Maternal Uncle   . Heart disease Maternal Grandfather    Social History  Substance Use Topics  . Smoking status: Never Smoker   . Smokeless tobacco: Never Used  . Alcohol Use: No   Allergies  Allergen Reactions  . Diazepam     Triggers migraines  . Divalproex Sodium   . Gabapentin Nausea And Vomiting  . Ondansetron  Hcl     Triggers migraines, "makes me vomit"  . Penicillins     REACTION: lips swollen to point of bleeding, sever vaginal irritation  Reports tolerating augmentin well without any complaints or reaction  . Propoxyphene N-Acetaminophen Nausea And Vomiting  . Simvastatin     PATIENT CANNOT BE PRESCRIBED THIS STATIN WHILE RECEIVING NORVIR  . Tramadol Hcl     REACTION: migraines and itching--hx of migraines even without medication   . Zocor [Simvastatin - High Dose]      Current outpatient prescriptions:  .  Beclomethasone Dipropionate 80 MCG/ACT AERS, Place 2 Inhalers into the nose daily., Disp: 8.7 g, Rfl: 10 .  botulinum toxin Type A (BOTOX) 100 UNITS SOLR injection, INJECT 200 UNITS IM INTO  HEAD NECK EVERY 3 MONTHS, Disp: , Rfl:  .  Calcium Carbonate-Vitamin D (CALCIUM-VITAMIN D)  500-200 MG-UNIT per tablet, Take 2 tablets by mouth daily. , Disp: , Rfl:  .  esomeprazole (NEXIUM) 40 MG capsule, Take 1 capsule (40 mg total) by mouth 2 (two) times daily before a meal., Disp: 60 capsule, Rfl: 11 .  FLUoxetine HCl 60 MG TABS, Take 60 mg by mouth daily., Disp: 30 tablet, Rfl: 3 .  LORazepam (ATIVAN) 0.5 MG tablet, Take 1 tablet (0.5 mg total) by mouth daily as needed for anxiety., Disp: 30 tablet, Rfl: 0 .  mometasone-formoterol (DULERA) 100-5 MCG/ACT AERO, Take 2 puffs first thing in am and then another 2 puffs about 12 hours later., Disp: 1 Inhaler, Rfl: 0 .  oxyCODONE-acetaminophen (PERCOCET/ROXICET) 5-325 MG per tablet, Take 1 tablet by mouth every 6 (six) hours as needed for severe pain., Disp: 120 tablet, Rfl: 0 .  PREZCOBIX 800-150 MG per tablet, TAKE 1 TABLET BY MOUTH EVERY DAY, Disp: 30 tablet, Rfl: 5 .  PROAIR HFA 108 (90 BASE) MCG/ACT inhaler, INHALE 1 TO 2 PUFFS EVERY 4 HOURS AS NEEDED FOR WHEEZING & SHORTNESS OF BREATH, Disp: 8.5 g, Rfl: 2 .  rosuvastatin (CRESTOR) 10 MG tablet, Take 1 tablet (10 mg total) by mouth daily., Disp: 30 tablet, Rfl: 5 .  Tamsulosin HCl (FLOMAX) 0.4 MG CAPS, Take 0.4 mg by mouth daily. 1-2 daily, Disp: , Rfl:  .  TRANSDERM-SCOP, 1.5 MG, 1 MG/3DAYS, APPLY 1 PATCH ON SKIN EVERY OTHER DAY ASNEEDED, Disp: 15 patch, Rfl: 6 .  TRUVADA 200-300 MG per tablet, TAKE 1 TABLET BY MOUTH EVERY DAY, Disp: 30 tablet, Rfl: 11 .  VOLTAREN 1 % GEL, APPLY TO AFFECTED AREA TWICE A DAY FOR PAIN, Disp: 100 g, Rfl: 1 .  zolmitriptan (ZOMIG) 5 MG tablet, Take 1 tablet (5 mg total) by mouth as needed for migraine., Disp: 15 tablet, Rfl: 11     Review of Systems  Constitutional: Negative for fever, chills, diaphoresis, activity change, appetite change, fatigue and unexpected weight change.  HENT: Negative for congestion, facial swelling, postnasal drip, rhinorrhea, sinus pressure, sneezing, sore throat and trouble swallowing.   Eyes: Negative for photophobia and  visual disturbance.  Respiratory: Negative for apnea, cough, chest tightness, shortness of breath, wheezing and stridor.   Cardiovascular: Negative for chest pain and leg swelling.  Gastrointestinal: Negative for nausea, vomiting, abdominal pain, diarrhea, constipation, blood in stool, abdominal distention and anal bleeding.  Genitourinary: Negative for dysuria, hematuria, flank pain and difficulty urinating.  Musculoskeletal: Positive for myalgias and back pain. Negative for joint swelling, arthralgias and gait problem.  Skin: Negative for color change, pallor, rash and wound.  Neurological: Positive for headaches. Negative for dizziness, tremors,  weakness and light-headedness.  Hematological: Negative for adenopathy. Does not bruise/bleed easily.  Psychiatric/Behavioral: Positive for dysphoric mood. Negative for suicidal ideas, hallucinations, behavioral problems, confusion, sleep disturbance, self-injury and agitation. The patient is not hyperactive.        Objective:   Physical Exam  Constitutional: She is oriented to person, place, and time. She appears well-developed and well-nourished. No distress.  HENT:  Head: Normocephalic and atraumatic.  Mouth/Throat: Oropharynx is clear and moist. No oropharyngeal exudate, posterior oropharyngeal edema, posterior oropharyngeal erythema or tonsillar abscesses.  Eyes: Conjunctivae and EOM are normal. Pupils are equal, round, and reactive to light. No scleral icterus.  Neck: Normal range of motion. Neck supple. No JVD present.  Cardiovascular: Normal rate, regular rhythm and normal heart sounds.  Exam reveals no gallop and no friction rub.   No murmur heard. Pulmonary/Chest: Effort normal and breath sounds normal. No respiratory distress. She has no wheezes. She has no rales. She exhibits no tenderness.  Abdominal: She exhibits no distension and no mass. There is no tenderness. There is no rebound and no guarding.  Musculoskeletal: She exhibits no  edema or tenderness.  Lymphadenopathy:    She has no cervical adenopathy.  Neurological: She is alert and oriented to person, place, and time. She has normal reflexes. She exhibits normal muscle tone. Coordination normal.  Skin: Skin is warm and dry. She is not diaphoretic. No erythema. No pallor.  Psychiatric: Her speech is normal. Judgment and thought content normal.  Nursing note and vitals reviewed.         Assessment & Plan:  HIV: continue  Prezcobix and switch to Descovy (for better bone and kidney health   Hypertriglyceridemia: continue low dose crestor and would check an actual fasting lipid panel plus direct LDL (she has not had a fasting lipid panel in years)  Migraines: followed closely by Neurology  Anxiety and depression : still a prominent issue will defer to primary  Obese: while she has several relative with DM she is not succumbed to it. Still very good idea for her to lose weight  Vaginal yeast infection : In the past was perseverating on perceive thrush in oropharynx now  asking for this fluconazole for vaginal yeast infections with refills again  "in case" I think this is a psychosomatic component but will fill meds  I spent greater than 25 minutes with the patient including greater than 50% of time in face to face counsel of the patient re her HIV, migraines, anxiety, depression, hypertriglyceridiemia, obesity, and yeast infection  and in coordination of their care.

## 2015-03-26 ENCOUNTER — Other Ambulatory Visit: Payer: Self-pay | Admitting: Internal Medicine

## 2015-03-26 DIAGNOSIS — G894 Chronic pain syndrome: Secondary | ICD-10-CM

## 2015-03-26 MED ORDER — OXYCODONE-ACETAMINOPHEN 5-325 MG PO TABS: 1.0000 | ORAL_TABLET | Freq: Four times a day (QID) | ORAL | Status: DC | PRN

## 2015-03-26 NOTE — Telephone Encounter (Signed)
Has appt 10/19 therefore, only needs one month worth

## 2015-03-26 NOTE — Telephone Encounter (Signed)
Pt called requesting percocet  to be filled. °

## 2015-03-26 NOTE — Telephone Encounter (Signed)
Pt informed Rx is ready 

## 2015-03-26 NOTE — Telephone Encounter (Signed)
Last refill 8/22 Last OV 7/27 UDS 7/27

## 2015-03-30 NOTE — Addendum Note (Signed)
Addended by: Landis Gandy on: 03/30/2015 12:22 PM   Modules accepted: Orders

## 2015-04-02 ENCOUNTER — Encounter: Payer: Self-pay | Admitting: Certified Nurse Midwife

## 2015-04-02 ENCOUNTER — Ambulatory Visit (INDEPENDENT_AMBULATORY_CARE_PROVIDER_SITE_OTHER): Payer: Medicaid Other | Admitting: Certified Nurse Midwife

## 2015-04-02 VITALS — Temp 98.3°F | Wt 254.0 lb

## 2015-04-02 DIAGNOSIS — N760 Acute vaginitis: Secondary | ICD-10-CM

## 2015-04-02 DIAGNOSIS — L739 Follicular disorder, unspecified: Secondary | ICD-10-CM | POA: Diagnosis not present

## 2015-04-02 DIAGNOSIS — N390 Urinary tract infection, site not specified: Secondary | ICD-10-CM | POA: Diagnosis not present

## 2015-04-02 LAB — POCT URINALYSIS DIPSTICK
BILIRUBIN UA: NEGATIVE
GLUCOSE UA: NEGATIVE
Ketones, UA: NEGATIVE
Leukocytes, UA: NEGATIVE
NITRITE UA: NEGATIVE
Protein, UA: NEGATIVE
RBC UA: 50
Spec Grav, UA: <=1.005
Urobilinogen, UA: NEGATIVE
pH, UA: 7

## 2015-04-02 MED ORDER — FLUCONAZOLE 200 MG PO TABS
200.0000 mg | ORAL_TABLET | Freq: Once | ORAL | Status: DC
Start: 2015-04-02 — End: 2015-07-17

## 2015-04-02 MED ORDER — HYDROCORTISONE 1 % EX CREA: TOPICAL_CREAM | CUTANEOUS | Status: DC

## 2015-04-02 MED ORDER — NITROFURANTOIN MONOHYD MACRO 100 MG PO CAPS: 100.0000 mg | ORAL_CAPSULE | Freq: Two times a day (BID) | ORAL | Status: AC

## 2015-04-02 MED ORDER — TERCONAZOLE 0.4 % VA CREA: 1.0000 | TOPICAL_CREAM | Freq: Every day | VAGINAL | Status: DC

## 2015-04-02 MED ORDER — TRIPLE ANTIBIOTIC 5-400-5000 EX OINT: TOPICAL_OINTMENT | Freq: Four times a day (QID) | CUTANEOUS | Status: DC

## 2015-04-02 NOTE — Progress Notes (Signed)
Patient ID: [name], female   DOB: [birth year], 49 y.o.   MRN: [id number]   Chief Complaint  Patient presents with  . Vaginitis    vaginal discomfort with possible UTI    HPI [name] is a 49 y.o. female.  For yeast symptoms of itching and odorous vaginal discharge for about a month.  Has tried OTC monistat cream and Diflucan 100 mg; states that did not help the itching.  Has excoriation from scratching her bottom by her rectum.  Has also been shaving her pubic hair and has current folliculitis.  Has had UTI symptoms for a few days, states that she gets frequent UTIs and cannot get into her urologist for 3 months, does not want it to progress to a kidney infection.      HPI  Past Medical History  Diagnosis Date  . Anemia 10/2009    macrocytic anemia with baseline MCV 104-106  . PUD (peptic ulcer disease) 07/1997    per EGD report 07/1997 with history of esophagitis  . Migraines     on topamax and triptans, frequent (almost daily) attacks   . Hyperlipidemia   . HIV infection     CD4 = 570 (05/2010), VL undetectation  . Anxiety   . Spondylolisthesis of lumbar region     L5-S1  . Gastroparesis   . Shingles     in lumbar dermatome  . Hypertension     no meds  . Non Hodgkin's lymphoma     stage II, s/p resection, chemotherapy, radiotherapy, Dr. Beryle Beams is her oncologist.   . Asthma   . DVT (deep venous thrombosis)     history clot lt groin when she had lymphoma-in remision now.  . Traumatic tear of lateral meniscus of right knee 04/20/2012  . 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 Hiatal hernia 01/01/2002  . Acute gastritis without mention of hemorrhage 01/01/2002  . Duodenitis without mention of hemorrhage 01/01/2002  . GERD (gastroesophageal reflux disease)   . IBS (irritable bowel syndrome)     Past Surgical History  Procedure Laterality Date  . Upper gastrointestinal endoscopy  2003, 2007    done by Dr. Velora Heckler  . Tonsillectomy    . Endometrial ablation    .  Tympanostomy tube placement      as child-both  . Tympanoplasty Left   . Cholecystectomy    . Knee arthroscopy Right 04/20/12    Family History  Problem Relation Age of Onset  . Hypertension Mother   . Stroke Mother   . Heart disease Mother   . Breast cancer Paternal Grandmother   . Kidney cancer Maternal Uncle   . Heart disease Maternal Uncle   . Diabetes Maternal Uncle   . Diabetes Maternal Grandmother   . Heart disease Maternal Grandmother   . Emphysema Paternal Uncle     never smoker  . Emphysema Paternal Grandfather     never smoker  . Colon cancer Paternal Aunt     with met to lung   . Non-Hodgkin's lymphoma Maternal Uncle   . Heart disease Maternal Grandfather     Social History Social History  Substance Use Topics  . Smoking status: Never Smoker   . Smokeless tobacco: Never Used  . Alcohol Use: No    Allergies  Allergen Reactions  . Diazepam     Triggers migraines  . Divalproex Sodium   . Gabapentin Nausea And Vomiting  . Ondansetron Hcl     Triggers migraines, "makes me  vomit"  . Penicillins     REACTION: lips swollen to point of bleeding, sever vaginal irritation  Reports tolerating augmentin well without any complaints or reaction  . Propoxyphene N-Acetaminophen Nausea And Vomiting  . Simvastatin     PATIENT CANNOT BE PRESCRIBED THIS STATIN WHILE RECEIVING NORVIR  . Topamax [Topiramate]     Migraines   . Tramadol Hcl     REACTION: migraines and itching--hx of migraines even without medication   . Zocor [Simvastatin - High Dose]     Current Outpatient Prescriptions  Medication Sig Dispense Refill  . Beclomethasone Dipropionate 80 MCG/ACT AERS Place 2 Inhalers into the nose daily. 8.7 g 10  . botulinum toxin Type A (BOTOX) 100 UNITS SOLR injection INJECT 200 UNITS IM INTO  HEAD NECK EVERY 3 MONTHS    . Calcium Carbonate-Vitamin D (CALCIUM-VITAMIN D) 500-200 MG-UNIT per tablet Take 2 tablets by mouth daily.     Marland Kitchen emtricitabine-tenofovir AF  (DESCOVY) 200-25 MG per tablet Take 1 tablet by mouth daily. 30 tablet 11  . esomeprazole (NEXIUM) 40 MG capsule Take 1 capsule (40 mg total) by mouth 2 (two) times daily before a meal. 60 capsule 11  . fluconazole (DIFLUCAN) 200 MG tablet Take 1 tablet (200 mg total) by mouth once. Repeat in 48-72 hours. 4 tablet 0  . FLUoxetine HCl 60 MG TABS Take 60 mg by mouth daily. 30 tablet 3  . hydrocortisone cream 1 % Apply to affected area 2 times daily 30 g 1  . LORazepam (ATIVAN) 0.5 MG tablet Take 1 tablet (0.5 mg total) by mouth daily as needed for anxiety. 30 tablet 0  . mometasone-formoterol (DULERA) 100-5 MCG/ACT AERO Take 2 puffs first thing in am and then another 2 puffs about 12 hours later. 1 Inhaler 0  . neomycin-bacitracin-polymyxin (NEOSPORIN) [id number] ointment Apply topically 4 (four) times daily. 28.3 g 0  . nitrofurantoin, macrocrystal-monohydrate, (MACROBID) 100 MG capsule Take 1 capsule (100 mg total) by mouth 2 (two) times daily. 14 capsule 0  . oxyCODONE-acetaminophen (PERCOCET/ROXICET) 5-325 MG per tablet Take 1 tablet by mouth every 6 (six) hours as needed for severe pain. 120 tablet 0  . PREZCOBIX 800-150 MG per tablet TAKE 1 TABLET BY MOUTH EVERY DAY 30 tablet 5  . PROAIR HFA 108 (90 BASE) MCG/ACT inhaler INHALE 1 TO 2 PUFFS EVERY 4 HOURS AS NEEDED FOR WHEEZING & SHORTNESS OF BREATH 8.5 g 2  . rosuvastatin (CRESTOR) 10 MG tablet Take 1 tablet (10 mg total) by mouth daily. 30 tablet 5  . Tamsulosin HCl (FLOMAX) 0.4 MG CAPS Take 0.4 mg by mouth daily. 1-2 daily    . terconazole (TERAZOL 7) 0.4 % vaginal cream Place 1 applicator vaginally at bedtime. 45 g 0  . TRANSDERM-SCOP, 1.5 MG, 1 MG/3DAYS APPLY 1 PATCH ON SKIN EVERY OTHER DAY ASNEEDED 15 patch 6  . VOLTAREN 1 % GEL APPLY TO AFFECTED AREA TWICE A DAY FOR PAIN 100 g 1  . zolmitriptan (ZOMIG) 5 MG tablet Take 1 tablet (5 mg total) by mouth as needed for migraine. 15 tablet 11   No current facility-administered medications for  this visit.    Review of Systems Review of Systems Constitutional: negative for fatigue and weight loss Respiratory: negative for cough and wheezing Cardiovascular: negative for chest pain, fatigue and palpitations Gastrointestinal: negative for abdominal pain and change in bowel habits Genitourinary:negative Integument/breast: negative for nipple discharge Musculoskeletal:negative for myalgias Neurological: negative for gait problems and tremors Behavioral/Psych: negative for abusive relationship,  depression Endocrine: negative for temperature intolerance     Temperature 98.3 F (36.8 C), weight 254 lb (115.214 kg).  Physical Exam Physical Exam General:   alert  Skin:   no rash or abnormalities  Lungs:   clear to auscultation bilaterally  Heart:   regular rate and rhythm, S1, S2 normal, no murmur, click, rub or gallop  Breasts:   normal without suspicious masses, skin or nipple changes or axillary nodes  Abdomen:  normal findings: no organomegaly, soft, non-tender and no hernia  Pelvis:  External genitalia: normal general appearance Urinary system: urethral meatus normal and bladder without fullness, nontender Vaginal: normal without tenderness, induration or masses Cervix: normal appearance Adnexa: normal bimanual exam Uterus: anteverted and non-tender, normal size    50% of 15 min visit spent on counseling and coordination of care.   Data Reviewed Previous medical hx, labs, meds  Assessment     Folliculitis Vulvovaginitis ?UTI vs urinary cystitis     Plan   Samples of uribel given to patient  Orders Placed This Encounter  Procedures  . SureSwab, Vaginosis/Vaginitis Plus  . Urine culture  . POCT urinalysis dipstick   Meds ordered this encounter  Medications  . nitrofurantoin, macrocrystal-monohydrate, (MACROBID) 100 MG capsule    Sig: Take 1 capsule (100 mg total) by mouth 2 (two) times daily.    Dispense:  14 capsule    Refill:  0  . fluconazole  (DIFLUCAN) 200 MG tablet    Sig: Take 1 tablet (200 mg total) by mouth once. Repeat in 48-72 hours.    Dispense:  4 tablet    Refill:  0  . terconazole (TERAZOL 7) 0.4 % vaginal cream    Sig: Place 1 applicator vaginally at bedtime.    Dispense:  45 g    Refill:  0  . hydrocortisone cream 1 %    Sig: Apply to affected area 2 times daily    Dispense:  30 g    Refill:  1  . neomycin-bacitracin-polymyxin (NEOSPORIN) [phone] ointment    Sig: Apply topically 4 (four) times daily.    Dispense:  28.3 g    Refill:  0     Follow up as needed or for annual exam.

## 2015-04-03 LAB — URINE CULTURE
COLONY COUNT: NO GROWTH
ORGANISM ID, BACTERIA: NO GROWTH

## 2015-04-06 LAB — SURESWAB, VAGINOSIS/VAGINITIS PLUS
ATOPOBIUM VAGINAE: NOT DETECTED Log (cells/mL)
C. ALBICANS, DNA: NOT DETECTED
C. PARAPSILOSIS, DNA: NOT DETECTED
C. TRACHOMATIS RNA, TMA: NOT DETECTED
C. TROPICALIS, DNA: NOT DETECTED
C. glabrata, DNA: NOT DETECTED
LACTOBACILLUS SPECIES: 5.6 Log (cells/mL)
MEGASPHAERA SPECIES: NOT DETECTED Log (cells/mL)
N. gonorrhoeae RNA, TMA: NOT DETECTED
T. VAGINALIS RNA, QL TMA: NOT DETECTED

## 2015-04-09 ENCOUNTER — Telehealth: Payer: Self-pay | Admitting: Neurology

## 2015-04-09 NOTE — Telephone Encounter (Signed)
Pt called and needs prior auth on zolmitriptan (ZOMIG) 5 MG tablet. Pt is leaving to go out of town on Saturday. May call [phone]

## 2015-04-09 NOTE — Telephone Encounter (Addendum)
Ins has been provided with clinical info.  Request is currently under review.   Ins approved the request for coverage effective until 04/04/2016

## 2015-04-17 ENCOUNTER — Other Ambulatory Visit: Payer: Self-pay | Admitting: Certified Nurse Midwife

## 2015-04-17 ENCOUNTER — Other Ambulatory Visit: Payer: Self-pay | Admitting: *Deleted

## 2015-04-17 DIAGNOSIS — F419 Anxiety disorder, unspecified: Secondary | ICD-10-CM

## 2015-04-17 DIAGNOSIS — L309 Dermatitis, unspecified: Secondary | ICD-10-CM

## 2015-04-17 MED ORDER — LORAZEPAM 0.5 MG PO TABS: 0.5000 mg | ORAL_TABLET | Freq: Every day | ORAL | Status: DC | PRN

## 2015-04-17 NOTE — Telephone Encounter (Signed)
Last appt 7/27. Next appt 10/19.

## 2015-04-20 NOTE — Telephone Encounter (Signed)
Lorazepam rx called to [facility]. 

## 2015-04-22 ENCOUNTER — Ambulatory Visit (INDEPENDENT_AMBULATORY_CARE_PROVIDER_SITE_OTHER): Payer: Medicaid Other | Admitting: Internal Medicine

## 2015-04-22 ENCOUNTER — Encounter: Payer: Self-pay | Admitting: Internal Medicine

## 2015-04-22 VITALS — BP 120/68 | HR 88 | Temp 98.5°F | Ht 65.0 in | Wt 264.5 lb

## 2015-04-22 DIAGNOSIS — F329 Major depressive disorder, single episode, unspecified: Secondary | ICD-10-CM

## 2015-04-22 DIAGNOSIS — F32A Depression, unspecified: Secondary | ICD-10-CM

## 2015-04-22 DIAGNOSIS — F418 Other specified anxiety disorders: Secondary | ICD-10-CM | POA: Diagnosis not present

## 2015-04-22 DIAGNOSIS — F419 Anxiety disorder, unspecified: Secondary | ICD-10-CM

## 2015-04-22 DIAGNOSIS — G894 Chronic pain syndrome: Secondary | ICD-10-CM

## 2015-04-22 DIAGNOSIS — G43719 Chronic migraine without aura, intractable, without status migrainosus: Secondary | ICD-10-CM | POA: Diagnosis present

## 2015-04-22 MED ORDER — OXYCODONE-ACETAMINOPHEN 5-325 MG PO TABS: 1.0000 | ORAL_TABLET | Freq: Four times a day (QID) | ORAL | Status: DC | PRN

## 2015-04-22 MED ORDER — LORAZEPAM 0.5 MG PO TABS: 0.2500 mg | ORAL_TABLET | Freq: Every day | ORAL | Status: DC | PRN

## 2015-04-22 NOTE — Progress Notes (Signed)
Subjective:    Patient ID: [name], female    DOB: 11/28/1965, 49 y.o.   MRN: [id number]  HPI Comments: [name] is a 49 year old with PMH as below here for follow-up of chronic conditions.  Please see problem based charting for status of chronic medical conditions.     Past Medical History  Diagnosis Date  . Anemia 10/2009    macrocytic anemia with baseline MCV 104-106  . PUD (peptic ulcer disease) 07/1997    per EGD report 07/1997 with history of esophagitis  . Migraines     on topamax and triptans, frequent (almost daily) attacks   . Hyperlipidemia   . HIV infection ([place])     CD4 = 570 (05/2010), VL undetectation  . Anxiety   . Spondylolisthesis of lumbar region     L5-S1  . Gastroparesis   . Shingles     in lumbar dermatome  . Hypertension     no meds  . Non Hodgkin's lymphoma ([place])     stage II, s/p resection, chemotherapy, radiotherapy, Dr. Beryle Beams is her oncologist.   . Asthma   . DVT (deep venous thrombosis) ([facility])     history clot lt groin when she had lymphoma-in remision now.  . Traumatic tear of lateral meniscus of right knee 04/20/2012  . 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 Hiatal hernia 01/01/2002  . Acute gastritis without mention of hemorrhage 01/01/2002  . Duodenitis without mention of hemorrhage 01/01/2002  . GERD (gastroesophageal reflux disease)   . IBS (irritable bowel syndrome)    Current Outpatient Prescriptions on File Prior to Visit  Medication Sig Dispense Refill  . Beclomethasone Dipropionate 80 MCG/ACT AERS Place 2 Inhalers into the nose daily. 8.7 g 10  . botulinum toxin Type A (BOTOX) 100 UNITS SOLR injection INJECT 200 UNITS IM INTO  HEAD NECK EVERY 3 MONTHS    . Calcium Carbonate-Vitamin D (CALCIUM-VITAMIN D) 500-200 MG-UNIT per tablet Take 2 tablets by mouth daily.     Marland Kitchen emtricitabine-tenofovir AF (DESCOVY) 200-25 MG per tablet Take 1 tablet by mouth daily. 30 tablet 11  . esomeprazole (NEXIUM) 40 MG capsule Take 1 capsule (40  mg total) by mouth 2 (two) times daily before a meal. 60 capsule 11  . fluconazole (DIFLUCAN) 200 MG tablet Take 1 tablet (200 mg total) by mouth once. Repeat in 48-72 hours. 4 tablet 0  . FLUoxetine HCl 60 MG TABS Take 60 mg by mouth daily. 30 tablet 3  . hydrocortisone cream 1 % APPLY TO AFFECTED AREA TWICE A DAY 28 g 2  . LORazepam (ATIVAN) 0.5 MG tablet Take 1 tablet (0.5 mg total) by mouth daily as needed for anxiety. 30 tablet 0  . mometasone-formoterol (DULERA) 100-5 MCG/ACT AERO Take 2 puffs first thing in am and then another 2 puffs about 12 hours later. 1 Inhaler 0  . neomycin-bacitracin-polymyxin (NEOSPORIN) [phone] ointment Apply topically 4 (four) times daily. 28.3 g 0  . oxyCODONE-acetaminophen (PERCOCET/ROXICET) 5-325 MG per tablet Take 1 tablet by mouth every 6 (six) hours as needed for severe pain. 120 tablet 0  . PREZCOBIX 800-150 MG per tablet TAKE 1 TABLET BY MOUTH EVERY DAY 30 tablet 5  . PROAIR HFA 108 (90 BASE) MCG/ACT inhaler INHALE 1 TO 2 PUFFS EVERY 4 HOURS AS NEEDED FOR WHEEZING & SHORTNESS OF BREATH 8.5 g 2  . rosuvastatin (CRESTOR) 10 MG tablet Take 1 tablet (10 mg total) by mouth daily. 30 tablet 5  . Tamsulosin  HCl (FLOMAX) 0.4 MG CAPS Take 0.4 mg by mouth daily. 1-2 daily    . terconazole (TERAZOL 7) 0.4 % vaginal cream Place 1 applicator vaginally at bedtime. 45 g 0  . TRANSDERM-SCOP, 1.5 MG, 1 MG/3DAYS APPLY 1 PATCH ON SKIN EVERY OTHER DAY ASNEEDED 15 patch 6  . VOLTAREN 1 % GEL APPLY TO AFFECTED AREA TWICE A DAY FOR PAIN 100 g 1  . zolmitriptan (ZOMIG) 5 MG tablet Take 1 tablet (5 mg total) by mouth as needed for migraine. 15 tablet 11   No current facility-administered medications on file prior to visit.    Review of Systems  Constitutional: Positive for appetite change. Negative for fever and chills.       Appetite comes and goes.  HENT: Negative for ear discharge, ear pain and hearing loss.   Respiratory: Negative for cough, shortness of breath and  wheezing.        No increased rescue inhaler use.  Cardiovascular: Positive for palpitations and leg swelling. Negative for chest pain.       Occasional leg swelling that responds to elevation and compression.  Neurological: Positive for headaches.       H/A improving with Botox relief x 6 weeks or so  Psychiatric/Behavioral: Positive for dysphoric mood. Negative for suicidal ideas.       Filed Vitals:   04/22/15 1346  BP: 120/68  Pulse: 88  Temp: 98.5 F (36.9 C)  TempSrc: Oral  Height: 5\' 5"  (1.651 m)  Weight: 264 lb 8 oz (119.976 kg)  SpO2: 97%    Objective:   Physical Exam  Constitutional: She is oriented to person, place, and time. She appears well-developed. No distress.  HENT:  Head: Normocephalic and atraumatic.  Mouth/Throat: Oropharynx is clear and moist. No oropharyngeal exudate.  Eyes: EOM are normal. Pupils are equal, round, and reactive to light.  Neck: Neck supple.  Cardiovascular: Normal rate, regular rhythm and normal heart sounds.  Exam reveals no gallop and no friction rub.   No murmur heard. Pulmonary/Chest: Effort normal and breath sounds normal. No respiratory distress. She has no wheezes. She has no rales.  Abdominal: Soft. She exhibits no distension. There is no tenderness. There is no rebound.  Musculoskeletal: Normal range of motion. She exhibits no edema or tenderness.  Neurological: She is alert and oriented to person, place, and time. No cranial nerve deficit.  Skin: Skin is warm. She is not diaphoretic.  Psychiatric: She has a normal mood and affect. Her behavior is normal.  Vitals reviewed.         Assessment & Plan:  Please problem based charting for assessment and plan.

## 2015-04-22 NOTE — Patient Instructions (Signed)
1. Please take care of yourself as you deal with your recent loss.  I agree with your decision to try and taper your Ativan.  Please start taking 1/2 pill (0.25mg ) daily if you need it.  Keep taking Prozac.  Call me if you have problems with this med change.  Please come back to see me in 1 month so we can see how your mood is doing.    2. Please take all medications as prescribed.    3. If you have worsening of your symptoms or new symptoms arise, please call the clinic ([phone]), or go to the ER immediately if symptoms are severe.

## 2015-04-23 ENCOUNTER — Other Ambulatory Visit: Payer: Self-pay

## 2015-04-23 ENCOUNTER — Telehealth: Payer: Self-pay | Admitting: Neurology

## 2015-04-23 DIAGNOSIS — Z1231 Encounter for screening mammogram for malignant neoplasm of breast: Secondary | ICD-10-CM

## 2015-04-23 NOTE — Assessment & Plan Note (Signed)
Mood stable.  She wants to start tapering Ativan.   - continue Prozac 60mg  daily - decrease Ativan to 0.25mg  daily prn (half of current dose); these were just filled a few days ago so she should now have enough for two months - RTC in 1 month to see how her mood is with reduced dose

## 2015-04-23 NOTE — Assessment & Plan Note (Addendum)
Stable on Prozac 60mg  daily but asking about increasing it.  She denies increased depression but is lending a lot of support to her mom recently after death of her step-father two months ago.  She says she feels her mood is stable.  Denies anhedonia or SI.  She says she just returned from a trip to the mountains.  She has support in the form of family (lives with her mom and says she is her best friend and they have good relationship).  She says she is not interested in therapy.  She is aware of grief counseling available through hospice but does not feel like she needs it right now.  She has appropriate mood/affect, good eye contact.  After discussion, she agrees that it is not necessary to increase Prozac if symptoms are stable.  She will let me know if depressive symptoms re-emerge and we can increase if necessary.  RTC in 1 month for follow-up of depression and anxiety.

## 2015-04-23 NOTE — Assessment & Plan Note (Signed)
Stable on current regimen.  Forgot to get contract signed.  UDS appropriate last visit.  [place] narcotic database checked last week when I refilled Ativan and there was no suspicious activity. - rx provided for Percocet q6h prn #120 no refills - sign contract next visit - attempt wean in the near future

## 2015-04-23 NOTE — Telephone Encounter (Signed)
Please refer to previous phone note.   Returned patients call left a VM asking her to return my call.

## 2015-04-23 NOTE — Telephone Encounter (Signed)
Patient called to confirm Botox appointment. No future Botox appointments. Please call patient to schedule [phone].

## 2015-04-23 NOTE — Assessment & Plan Note (Signed)
She says Botox is providing relief for about 6 weeks.  She also continues on other migraine meds.

## 2015-04-28 NOTE — Progress Notes (Signed)
Case discussed with Dr. Wilson soon after the resident saw the patient. We reviewed the resident's history and exam and pertinent patient test results. I agree with the assessment, diagnosis, and plan of care documented in the resident's note. 

## 2015-05-06 ENCOUNTER — Ambulatory Visit (INDEPENDENT_AMBULATORY_CARE_PROVIDER_SITE_OTHER): Payer: Medicaid Other | Admitting: Sports Medicine

## 2015-05-06 ENCOUNTER — Encounter: Payer: Self-pay | Admitting: Sports Medicine

## 2015-05-06 VITALS — BP 115/60 | Ht 66.0 in | Wt 255.0 lb

## 2015-05-06 DIAGNOSIS — M25552 Pain in left hip: Secondary | ICD-10-CM | POA: Diagnosis not present

## 2015-05-06 DIAGNOSIS — S76019A Strain of muscle, fascia and tendon of unspecified hip, initial encounter: Secondary | ICD-10-CM | POA: Insufficient documentation

## 2015-05-06 DIAGNOSIS — M2550 Pain in unspecified joint: Secondary | ICD-10-CM | POA: Diagnosis not present

## 2015-05-06 DIAGNOSIS — F419 Anxiety disorder, unspecified: Secondary | ICD-10-CM | POA: Diagnosis not present

## 2015-05-06 DIAGNOSIS — S73199A Other sprain of unspecified hip, initial encounter: Secondary | ICD-10-CM

## 2015-05-06 MED ORDER — DICLOFENAC SODIUM 1 % TD GEL: 2.0000 g | Freq: Four times a day (QID) | TRANSDERMAL | Status: DC

## 2015-05-06 NOTE — Assessment & Plan Note (Signed)
I suggested a HEP Focus on 3 exercises and HO given  Note this will benefit as she gets more active and gets the weight loss started again Easy walking program  No change in meds except add Top Voltaren gel to massage ITB bilat tid to qid  Reck in 6 to 8 wks

## 2015-05-06 NOTE — Progress Notes (Signed)
Patient ID: [name], female   DOB: 1965/10/07, 49 y.o.   MRN: [id number]  Lots of family stress Brain surg in April for step dad Step father died 2 mos ago and she took care of him/ grieving now Original dad suicide when she was 63 Mother has health issues  Not able to go to gym Gained back 40 lbs Now starting to lose the weight again  Chronic hip pain We xrayed left hip 2013 Now hurting with the left > RT  In past she responded to exercise program Her morbid obesity causes a sway in her gait that causes hip pain and this is worse when she is tired or heavier  Past Hx: HIV status followed by ID and is stable  ROS No recent fever, chills No generalized joint swelling No redness over joints No warmth  EXAM NAD Morbidly obese and appears somewhat down BP 115/60 mmHg  Ht 5\' 6"  (1.676 m)  Wt 255 lb (115.667 kg)  BMI 41.18 kg/m2   RT and LT hip show excellent ROM AT least 30 deg IR bilat strehgth quads is good Hip flexor is 5/5 Hip abductor 4/5 on RT and on left this is weaker 4-/5 Pain on testing left hip abduct  TTP over lateral hip and grt troch left Tightness and mild TTP along ITB bilat

## 2015-05-06 NOTE — Patient Instructions (Addendum)
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

## 2015-05-08 ENCOUNTER — Other Ambulatory Visit: Payer: Self-pay | Admitting: Obstetrics

## 2015-05-08 ENCOUNTER — Ambulatory Visit
Admission: RE | Admit: 2015-05-08 | Discharge: 2015-05-08 | Disposition: A | Discharge status: Home / Self Care | Payer: Medicaid Other | Source: Ambulatory Visit

## 2015-05-08 DIAGNOSIS — Z1231 Encounter for screening mammogram for malignant neoplasm of breast: Secondary | ICD-10-CM

## 2015-05-08 DIAGNOSIS — N644 Mastodynia: Secondary | ICD-10-CM

## 2015-05-18 ENCOUNTER — Other Ambulatory Visit: Payer: Medicaid Other

## 2015-05-25 ENCOUNTER — Ambulatory Visit
Admission: RE | Admit: 2015-05-25 | Discharge: 2015-05-25 | Disposition: A | Discharge status: Home / Self Care | Payer: Medicaid Other | Source: Ambulatory Visit | Attending: Obstetrics | Admitting: Obstetrics

## 2015-05-25 DIAGNOSIS — N644 Mastodynia: Secondary | ICD-10-CM

## 2015-05-27 ENCOUNTER — Encounter: Payer: Self-pay | Admitting: Internal Medicine

## 2015-05-27 ENCOUNTER — Ambulatory Visit (INDEPENDENT_AMBULATORY_CARE_PROVIDER_SITE_OTHER): Payer: Medicaid Other | Admitting: Internal Medicine

## 2015-05-27 VITALS — BP 120/59 | HR 78 | Temp 97.7°F | Ht 66.0 in | Wt 266.7 lb

## 2015-05-27 DIAGNOSIS — F32A Depression, unspecified: Secondary | ICD-10-CM

## 2015-05-27 DIAGNOSIS — K5900 Constipation, unspecified: Secondary | ICD-10-CM | POA: Diagnosis not present

## 2015-05-27 DIAGNOSIS — G894 Chronic pain syndrome: Secondary | ICD-10-CM

## 2015-05-27 DIAGNOSIS — F418 Other specified anxiety disorders: Secondary | ICD-10-CM

## 2015-05-27 DIAGNOSIS — Z79899 Other long term (current) drug therapy: Secondary | ICD-10-CM | POA: Diagnosis not present

## 2015-05-27 DIAGNOSIS — F329 Major depressive disorder, single episode, unspecified: Secondary | ICD-10-CM

## 2015-05-27 DIAGNOSIS — F419 Anxiety disorder, unspecified: Secondary | ICD-10-CM

## 2015-05-27 MED ORDER — LORAZEPAM 0.5 MG PO TABS: 0.5000 mg | ORAL_TABLET | Freq: Every day | ORAL | Status: DC | PRN

## 2015-05-27 MED ORDER — OXYCODONE-ACETAMINOPHEN 5-325 MG PO TABS: 1.0000 | ORAL_TABLET | Freq: Four times a day (QID) | ORAL | Status: DC | PRN

## 2015-05-27 NOTE — Assessment & Plan Note (Addendum)
Assessment:  She did decrease to 0.25mg  Ativan daily prn (which she takes daily) but feels she is more anxious on the lower dose.  She feels anxiety is related to the stress of the holidays.  Review of narcotic database reveals Ativan was filled on 10/17, two days before our last visit on 10/19 so based on my calculation, if she has been taking 0.25mg  since our last visit she should have about 10.5 pills of Ativan left.  She tells me she thinks she has about 12 pills left so I think that is accurate and she has enough to last through the end of this month.   Plan:  Will increase back to 0.5mg  since she is likely more anxious at this time of year and with recent family stress (loss of her step-dad and now her grandfather is terminally ill).  She should have enough pills to last until around Dec 2.  Paper rx printed for Ativan 0.5mg  daily prn #30 to be fill on or after 06/05/15 and one refill rx for Ativan 0.5mg  daily prn #30 printed to be filled on or after 07/06/15.  Will attempt to wean after the new year.  Follow-up in 2 months.

## 2015-05-27 NOTE — Assessment & Plan Note (Signed)
Assessment:  Likely 2/2 to opioids.  She also reports not drinking enough water and increased in "wrong" foods during this holiday season so that is likely contributing.  No abd pain or vomiting.  She says it feels like usual constipation.  She says she has senokot and miralax to use prn and these have worked before.   Plan:  Prn senokot or miralax

## 2015-05-27 NOTE — Patient Instructions (Signed)
1. We will increase your ativan back to 0.5mg  daily for now while you are anxious during the holiday season.  We will try to taper in the future.  Continue your current Prozac dose of 60mg  for now.   2. Please take all medications as prescribed.    3. If you have worsening of your symptoms or new symptoms arise, please call the clinic [phone]), or go to the ER immediately if symptoms are severe.  Please come back to see me in 2 months or sooner if you needed.

## 2015-05-27 NOTE — Progress Notes (Signed)
Subjective:    Patient ID: [name], female    DOB: Aug 25, 1965, 49 y.o.   MRN: [id number]  HPI Comments: [name] is a 49 year old woman with PMH as below here for follow-up of anxiety and depression.  Please see problem based charting for the status of her chronic conditions.     Past Medical History  Diagnosis Date  . Anemia 10/2009    macrocytic anemia with baseline MCV 104-106  . PUD (peptic ulcer disease) 07/1997    per EGD report 07/1997 with history of esophagitis  . Migraines     on topamax and triptans, frequent (almost daily) attacks   . Hyperlipidemia   . HIV infection ([place])     CD4 = 570 (05/2010), VL undetectation  . Anxiety   . Spondylolisthesis of lumbar region     L5-S1  . Gastroparesis   . Shingles     in lumbar dermatome  . Hypertension     no meds  . Non Hodgkin's lymphoma ([place])     stage II, s/p resection, chemotherapy, radiotherapy, Dr. Beryle Beams is her oncologist.   . Asthma   . DVT (deep venous thrombosis) ([facility])     history clot lt groin when she had lymphoma-in remision now.  . Traumatic tear of lateral meniscus of right knee 04/20/2012  . 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 Hiatal hernia 01/01/2002  . Acute gastritis without mention of hemorrhage 01/01/2002  . Duodenitis without mention of hemorrhage 01/01/2002  . GERD (gastroesophageal reflux disease)   . IBS (irritable bowel syndrome)    Current Outpatient Prescriptions on File Prior to Visit  Medication Sig Dispense Refill  . Beclomethasone Dipropionate 80 MCG/ACT AERS Place 2 Inhalers into the nose daily. 8.7 g 10  . botulinum toxin Type A (BOTOX) 100 UNITS SOLR injection INJECT 200 UNITS IM INTO  HEAD NECK EVERY 3 MONTHS    . Calcium Carbonate-Vitamin D (CALCIUM-VITAMIN D) 500-200 MG-UNIT per tablet Take 2 tablets by mouth daily.     . diclofenac sodium (VOLTAREN) 1 % GEL Apply 2 g topically 4 (four) times daily. 100 g 2  . emtricitabine-tenofovir AF (DESCOVY) 200-25 MG per tablet  Take 1 tablet by mouth daily. 30 tablet 11  . esomeprazole (NEXIUM) 40 MG capsule Take 1 capsule (40 mg total) by mouth 2 (two) times daily before a meal. 60 capsule 11  . fluconazole (DIFLUCAN) 200 MG tablet Take 1 tablet (200 mg total) by mouth once. Repeat in 48-72 hours. 4 tablet 0  . FLUoxetine HCl 60 MG TABS Take 60 mg by mouth daily. 30 tablet 3  . hydrocortisone cream 1 % APPLY TO AFFECTED AREA TWICE A DAY 28 g 2  . LORazepam (ATIVAN) 0.5 MG tablet Take 0.5 tablets (0.25 mg total) by mouth daily as needed for anxiety. 15 tablet 0  . mometasone-formoterol (DULERA) 100-5 MCG/ACT AERO Take 2 puffs first thing in am and then another 2 puffs about 12 hours later. 1 Inhaler 0  . neomycin-bacitracin-polymyxin (NEOSPORIN) [phone] ointment Apply topically 4 (four) times daily. 28.3 g 0  . oxyCODONE-acetaminophen (PERCOCET/ROXICET) 5-325 MG tablet Take 1 tablet by mouth every 6 (six) hours as needed for severe pain. 120 tablet 0  . PREZCOBIX 800-150 MG per tablet TAKE 1 TABLET BY MOUTH EVERY DAY 30 tablet 5  . PROAIR HFA 108 (90 BASE) MCG/ACT inhaler INHALE 1 TO 2 PUFFS EVERY 4 HOURS AS NEEDED FOR WHEEZING & SHORTNESS OF BREATH 8.5 g 2  .  rosuvastatin (CRESTOR) 10 MG tablet Take 1 tablet (10 mg total) by mouth daily. 30 tablet 5  . Tamsulosin HCl (FLOMAX) 0.4 MG CAPS Take 0.4 mg by mouth daily. 1-2 daily    . terconazole (TERAZOL 7) 0.4 % vaginal cream Place 1 applicator vaginally at bedtime. 45 g 0  . TRANSDERM-SCOP, 1.5 MG, 1 MG/3DAYS APPLY 1 PATCH ON SKIN EVERY OTHER DAY ASNEEDED 15 patch 6  . zolmitriptan (ZOMIG) 5 MG tablet Take 1 tablet (5 mg total) by mouth as needed for migraine. 15 tablet 11   No current facility-administered medications on file prior to visit.    Review of Systems  Constitutional: Negative for fever and chills.  Respiratory: Negative for shortness of breath and wheezing.   Gastrointestinal: Positive for constipation. Negative for vomiting, abdominal pain, diarrhea  and blood in stool.       Last normal BM 3 days ago.  No blood in stool.    Psychiatric/Behavioral: Negative for suicidal ideas and dysphoric mood. The patient is nervous/anxious.        Increased anxiety around holidays and with recent loss of step-dad       Filed Vitals:   05/27/15 1431  BP: 120/59  Pulse: 78  Temp: 97.7 F (36.5 C)  TempSrc: Oral  Height: 5\' 6"  (1.676 m)  Weight: 266 lb 11.2 oz (120.974 kg)  SpO2: 97%    Objective:   Physical Exam  Constitutional: She is oriented to person, place, and time. She appears well-developed. No distress.  HENT:  Head: Normocephalic and atraumatic.  Mouth/Throat: Oropharynx is clear and moist. No oropharyngeal exudate.  Eyes: EOM are normal. Pupils are equal, round, and reactive to light.  Neck: Neck supple.  Cardiovascular: Normal rate, regular rhythm and normal heart sounds.  Exam reveals no gallop and no friction rub.   No murmur heard. Pulmonary/Chest: Effort normal and breath sounds normal. No respiratory distress. She has no wheezes. She has no rales.  Abdominal: Soft. Bowel sounds are normal. She exhibits no distension. There is no tenderness. There is no rebound.  Neurological: She is alert and oriented to person, place, and time. No cranial nerve deficit.  Skin: Skin is warm. She is not diaphoretic.  Psychiatric: She has a normal mood and affect. Her behavior is normal. Judgment and thought content normal.  Vitals reviewed.         Assessment & Plan:  Please see problem based charting for A&P.

## 2015-05-27 NOTE — Assessment & Plan Note (Addendum)
Assessment:  Still with stable chronic pain.  She says she needs the Vicodin q6h for adequate pain control.  She is adamant about not decreasing.  UDS recently appropriate.  Narcotic database checked today and there is no red flag behavior. Plan:  Paper rx for Vicodin 5/325 #120, take 1 pill q6h prn provided to fill now.  Refill rx provided for Vicodin 5/325 #120, take 1 pill q6h prn fill on or after 06/26/15.  New controlled substance agreement signed today.  Attempt wean in future.  Follow-up in 2 months.

## 2015-05-27 NOTE — Assessment & Plan Note (Addendum)
Assessment:  She reports depression surrounding weight gain and grief due to family loss (step-dad recently passed) and grandfather terminal.  She denies SI and reports good support system.  She is resistant to counseling or seeking outside help.  Initially, she asks about increasing Prozac but then decides to continue current dose. Plan:  Continue Prozac 60mg  daily, can increase to 80mg  in future if needed.  Continue to offer counseling in the future.  Follow-up in 2 months.

## 2015-05-28 NOTE — Progress Notes (Signed)
Internal Medicine Clinic Attending  Case discussed with Dr. Wilson soon after the resident saw the patient.  We reviewed the resident's history and exam and pertinent patient test results.  I agree with the assessment, diagnosis, and plan of care documented in the resident's note.  

## 2015-06-08 ENCOUNTER — Ambulatory Visit (INDEPENDENT_AMBULATORY_CARE_PROVIDER_SITE_OTHER): Payer: Medicaid Other | Admitting: Neurology

## 2015-06-08 VITALS — BP 105/73 | HR 82 | Ht 66.0 in | Wt 267.0 lb

## 2015-06-08 DIAGNOSIS — G43719 Chronic migraine without aura, intractable, without status migrainosus: Secondary | ICD-10-CM | POA: Diagnosis not present

## 2015-06-08 MED ORDER — ZOLMITRIPTAN 5 MG PO TABS
5.0000 mg | ORAL_TABLET | ORAL | Status: DC | PRN
Start: 2015-06-08 — End: 2015-12-24

## 2015-06-08 NOTE — Progress Notes (Signed)
**  Botox Lot#[id number], Exp 01/2018, [facility] [id number], [facility]**[email or web address].

## 2015-06-08 NOTE — Progress Notes (Signed)
HPI:   [name] is a 49 year old Caucasian female with past medical history of HIV, hyperlipidemia, migraines, depression, and non-Hodgkin lymphoma in 2005 with chemotherapy and radiation.   She has history of migraines since 19. In the last 10 years, she reports having daily migraines. She is currently taking Topamax and Zomig with relief. She takes approximately 20-25 tablets of her Zomig per month.    She has aura's that look like floating dirt particles, flashing lights, and she sees Christmas tree lights when they are severe, she has nausea, vomiting and sometimes diarrhea when they're severe. She also complains of diplopia, blurry vision, and  photophobia. She also has had a lightning bolt sensation throughout her body.  She describes them as pulsating and throbbing  on a scale of 5-6/10 which she considers mild, when they are severe they can go as high as 10/10. They usually start in the right temporal area, but will also start on the left temporal and are worse.   Any type of smoke or MSG can precipitate a migraine. They generally last 2-9 days. She has been to the emergency department twice in the last year for migraines. She states "I just can't do this anymore I need to do something about her migraines"  She has tried and failed different preventive medications:  atenolol, verapamil, nortriptyline, Inderal, Depakote without benefit. Atenolol and verapamil caused hypotension. Depakote caused severe vomiting. Topamax causing her worsening headaches, will show tried Zonegran, Topamax ER, she denies significant improvement.  For abortive treatment, she has tried Imitrex, Maxalt with suboptimal response, Zomig as needed since to work best,  she also takes transderm scophalamine patch for nause  I started to give her BOTOX injection since May 2013, every 3 months, she responded very well,   Previous MRI brain was normal in 2013.  UPDATE May 25th 2016: Last Botox injection was in Feb 2016,  complains of soreness all over her head, lasting for 3 days, which has helped her headaches, she still gets headaches at least 6 each month, she is taking Zomig as needed, which has been helpful,  She has tried Inderal as preventive medication, complains of fatigue, at the same time difficulty falling to sleep, verapamil was helpful to her migraine the past I started verapamil 120 mg every day since February, which did not help, she is no longer taking it.  UPDATE Mar 05 2015: She is under a lot stress, is tearful during today's interview, her father passed away in Feb 07, 2015, she had frequent headaches over the past few weeks, but overall, previous Botox injection in Nov 26 2014 has been very helpful, she gets milder headaches has been taking frequent aspirin, also Zomig as needed, she also takes transderm scophalamine patch for nause    Physical Exam  General: Well groomed and obese female Neck: supple no carotid bruits Respiratory: clear to auscultation bilaterally Cardiovascular: regular rate rhythm She wear right knee brace  Neurologic Exam  Mental Status: obese, pleasant, awake, alert, cooperative to history, talking, and casual conversation. Cranial Nerves: CN II-XII pupils were equal round reactive to light. Extraocular movements were full.  Visual fields were full on confrontational test.  Facial sensation and strength were normal.   Uvula tongue were midline.  Head turning and shoulder shrugging were normal and symmetric.  Tongue protrusion into the cheeks strength were normal.  Motor: Normal tone, bulk, and strength. Sensory: Normal to light touch, pinprick, proprioception except decreased vibratory sensation to her right lower extremity she relates to  peripheral neuropathy.  Decreased vibratory sensation to left upper extremity.   Coordination: There was no dysmetria noticed. Gait and Station: Narrow based and steady,  Reflexes: Deep tendon reflexes: present and symmetric.  Plantar  responses are flexor.   Assessment and plan: 49 year-old Caucasian female with a history of HIV, hypercholesterolemia, Non hodgkin' lymphoma with chemotherapy and radiation and migraines since age 36.   She has tried and failed multiple preventive medications including atenolol, verapamil, nortriptyline, and Depakote,Topamax, Topamax ER,   She has migraines daily for more than  10 years. She is currently taking  zonogram  and Zomig with some relief. Previously tried imitrex and Maxalt with suboptimal response,  Normal MRI brain, and neurological exam.  BOTOX injection was performed according to protocol by [employer]. 100 units of BOTOX was dissolved into 2 cc NS.   Total of 200 units  Frontalis 4 sites,  20 units, Temporalis 8 sites,  40 units  Occipitalis 6 sites, 30 units Cervical Paraspinal, 4 sites, 20 units Trapezius, 6 sites, 30 units  Extra 60 units to cervical paraspinals  Patient tolerate the injection well. Will return for repeat injection in 3 months.  Continue Zomig as needed  Marcial Pacas, M.D. Ph.D.  [facility] [place], [place] [address] Phone: [phone] Fax:      [phone] HPI:   [name] is a 49 year old Caucasian female with past medical history of HIV, hyperlipidemia, migraines, depression, and non-Hodgkin lymphoma in 2005 with chemotherapy and radiation.   She has history of migraines since 19. In the last 10 years, she reports having daily migraines. She is currently taking Topamax and Zomig with relief. She takes approximately 20-25 tablets of her Zomig per month.    She has aura's that look like floating dirt particles, flashing lights, and she sees Christmas tree lights when they are severe, she has nausea, vomiting and sometimes diarrhea when they're severe. She also complains of diplopia, blurry vision, and  photophobia. She also has had a lightning bolt sensation throughout her body.  She describes them as pulsating and  throbbing  on a scale of 5-6/10 which she considers mild, when they are severe they can go as high as 10/10. They usually start in the right temporal area, but will also start on the left temporal and are worse.   Any type of smoke or MSG can precipitate a migraine. They generally last 2-9 days. She has been to the emergency department twice in the last year for migraines. She states "I just can't do this anymore I need to do something about her migraines"  She has tried and failed different preventive medications:  atenolol, verapamil, nortriptyline, Inderal, Depakote without benefit. Atenolol and verapamil caused hypotension. Depakote caused severe vomiting. Topamax causing her worsening headaches, will show tried Zonegran, Topamax ER, she denies significant improvement.  For abortive treatment, she has tried Imitrex, Maxalt with suboptimal response, Zomig as needed since to work best,  she also takes transderm scophalamine patch for nause  I started to give her BOTOX injection since May 2013, every 3 months, she responded very well,   Previous MRI brain was normal in 2013.  UPDATE May 25th 2016: Last Botox injection was in Feb 2016, complains of soreness all over her head, lasting for 3 days, which has helped her headaches, she still gets headaches at least 6 each month, she is taking Zomig as needed, which has been helpful,  She has tried Inderal as preventive medication, complains of fatigue, at  the same time difficulty falling to sleep, verapamil was helpful to her migraine the past I started verapamil 120 mg every day since February, which did not help, she is no longer taking it.  UPDATE Mar 05 2015: She is under a lot stress, is tearful during today's interview, her father passed away in 02-11-15, she had frequent headaches over the past few weeks, but overall, previous Botox injection in Nov 26 2014 has been very helpful, she gets milder headaches has been taking frequent aspirin, also  Zomig as needed, she also takes transderm scophalamine patch for nause  UPDATE Jun 08 2015: She responded to injection in September 2016 very well, but noticed wearing of in the last couple weeks, having daily headaches in December 2016, she has lost a lot of family members, going through a lot of emotional stress    Physical Exam  General: Well groomed and obese female Neck: supple no carotid bruits Respiratory: clear to auscultation bilaterally Cardiovascular: regular rate rhythm She wear right knee brace  Neurologic Exam  Mental Status: obese, pleasant, awake, alert, cooperative to history, talking, and casual conversation. Cranial Nerves: CN II-XII pupils were equal round reactive to light. Extraocular movements were full.  Visual fields were full on confrontational test.  Facial sensation and strength were normal.   Uvula tongue were midline.  Head turning and shoulder shrugging were normal and symmetric.  Tongue protrusion into the cheeks strength were normal.  Motor: Normal tone, bulk, and strength. Sensory: Normal to light touch, pinprick, proprioception except decreased vibratory sensation to her right lower extremity she relates to peripheral neuropathy.  Decreased vibratory sensation to left upper extremity.   Coordination: There was no dysmetria noticed. Gait and Station: Narrow based and steady,  Reflexes: Deep tendon reflexes: present and symmetric.  Plantar responses are flexor.   Assessment and plan: 49 year-old Caucasian female with a history of HIV, hypercholesterolemia, Non hodgkin' lymphoma with chemotherapy and radiation and migraines since age 89.   She has tried and failed multiple preventive medications including atenolol, verapamil, nortriptyline, and Depakote,Topamax, Topamax ER,   She has migraines daily for more than  10 years. She is currently taking  zonogram  and Zomig with some relief. Previously tried imitrex and Maxalt with suboptimal response,  Normal MRI  brain, and neurological exam.  BOTOX injection was performed according to protocol by [employer]. 100 units of BOTOX was dissolved into 2 cc NS  Used total 200 units  Corrugator 2 sites, 10 units Procerus 1 site, 5 unit Frontalis 4 sites,  20 units, Temporalis 8 sites,  40 units  Occipitalis 6 sites, 30 units Cervical Paraspinal, 4 sites, 20 units Trapezius, 6 sites, 30 units  Extra 45 units were injected along bilateral cervical paraspinal muscles  Patient tolerate the injection well. Will return for repeat injection in 3 months.  Patient tolerate the injection well. Will return for repeat injection in 3 months.  I refilled her Zomig as needed  Marcial Pacas, M.D. Ph.D.  [facility] [place], [place] [address] Phone: [phone] Fax:      [phone]

## 2015-06-12 ENCOUNTER — Other Ambulatory Visit: Payer: Self-pay | Admitting: Infectious Disease

## 2015-07-08 ENCOUNTER — Other Ambulatory Visit: Payer: Self-pay | Admitting: Internal Medicine

## 2015-07-08 DIAGNOSIS — R0989 Other specified symptoms and signs involving the circulatory and respiratory systems: Secondary | ICD-10-CM

## 2015-07-16 ENCOUNTER — Other Ambulatory Visit: Payer: Self-pay | Admitting: Infectious Disease

## 2015-07-23 ENCOUNTER — Other Ambulatory Visit: Payer: Self-pay | Admitting: Internal Medicine

## 2015-07-23 DIAGNOSIS — G894 Chronic pain syndrome: Secondary | ICD-10-CM

## 2015-07-23 NOTE — Telephone Encounter (Signed)
Pt requesting percocet to be filled. °

## 2015-07-23 NOTE — Telephone Encounter (Signed)
Could pt have 3 scripts, she comes from [facility]

## 2015-07-24 MED ORDER — OXYCODONE-ACETAMINOPHEN 5-325 MG PO TABS: 1.0000 | ORAL_TABLET | Freq: Four times a day (QID) | ORAL | Status: DC | PRN

## 2015-07-24 NOTE — Telephone Encounter (Signed)
Dr Redmond Pulling in clinic.  Will ask to refill

## 2015-07-24 NOTE — Telephone Encounter (Signed)
Pt informed Rx is ready 

## 2015-07-27 ENCOUNTER — Other Ambulatory Visit: Payer: Medicaid Other

## 2015-07-28 ENCOUNTER — Other Ambulatory Visit: Payer: Medicaid Other

## 2015-07-28 DIAGNOSIS — B20 Human immunodeficiency virus [HIV] disease: Secondary | ICD-10-CM

## 2015-07-28 LAB — CBC WITH DIFFERENTIAL/PLATELET
BASOS PCT: 0 % (ref 0–1)
Basophils Absolute: 0 10*3/uL (ref 0.0–0.1)
EOS PCT: 3 % (ref 0–5)
Eosinophils Absolute: 0.2 10*3/uL (ref 0.0–0.7)
HEMATOCRIT: 41.9 % (ref 36.0–46.0)
HEMOGLOBIN: 13.7 g/dL (ref 12.0–15.0)
LYMPHS PCT: 40 % (ref 12–46)
Lymphs Abs: 2.7 10*3/uL (ref 0.7–4.0)
MCH: 27.1 pg (ref 26.0–34.0)
MCHC: 32.7 g/dL (ref 30.0–36.0)
MCV: 82.8 fL (ref 78.0–100.0)
MONO ABS: 0.4 10*3/uL (ref 0.1–1.0)
MONOS PCT: 6 % (ref 3–12)
MPV: 8.7 fL (ref 8.6–12.4)
NEUTROS ABS: 3.4 10*3/uL (ref 1.7–7.7)
Neutrophils Relative %: 51 % (ref 43–77)
Platelets: 231 10*3/uL (ref 150–400)
RBC: 5.06 MIL/uL (ref 3.87–5.11)
RDW: 15.9 % — AB (ref 11.5–15.5)
WBC: 6.7 10*3/uL (ref 4.0–10.5)

## 2015-07-28 LAB — COMPLETE METABOLIC PANEL WITH GFR
ALBUMIN: 4.3 g/dL (ref 3.6–5.1)
ALK PHOS: 63 U/L (ref 33–115)
ALT: 13 U/L (ref 6–29)
AST: 16 U/L (ref 10–35)
BUN: 10 mg/dL (ref 7–25)
CALCIUM: 9.6 mg/dL (ref 8.6–10.2)
CHLORIDE: 99 mmol/L (ref 98–110)
CO2: 31 mmol/L (ref 20–31)
Creat: 0.89 mg/dL (ref 0.50–1.10)
GFR, EST AFRICAN AMERICAN: 89 mL/min (ref >=60)
GFR, EST NON AFRICAN AMERICAN: 77 mL/min (ref >=60)
Glucose, Bld: 108 mg/dL — ABNORMAL HIGH (ref 65–99)
POTASSIUM: 4.2 mmol/L (ref 3.5–5.3)
Sodium: 140 mmol/L (ref 135–146)
Total Bilirubin: 0.3 mg/dL (ref 0.2–1.2)
Total Protein: 7.4 g/dL (ref 6.1–8.1)

## 2015-07-29 LAB — HIV-1 RNA QUANT-NO REFLEX-BLD
HIV 1 RNA Quant: <20 copies/mL (ref <20)
HIV-1 RNA Quant, Log: <1.3 Log copies/mL (ref <1.30)

## 2015-07-29 LAB — T-HELPER CELL (CD4) - (RCID CLINIC ONLY)
CD4 % Helper T Cell: 35 % (ref 33–55)
CD4 T CELL ABS: 970 /uL (ref 400–2700)

## 2015-07-30 ENCOUNTER — Other Ambulatory Visit: Payer: Self-pay | Admitting: *Deleted

## 2015-07-30 MED ORDER — DICLOFENAC SODIUM 1 % TD GEL: 2.0000 g | Freq: Four times a day (QID) | TRANSDERMAL | Status: DC

## 2015-08-04 ENCOUNTER — Ambulatory Visit (INDEPENDENT_AMBULATORY_CARE_PROVIDER_SITE_OTHER): Payer: Medicaid Other | Admitting: Internal Medicine

## 2015-08-04 ENCOUNTER — Encounter: Payer: Self-pay | Admitting: Internal Medicine

## 2015-08-04 VITALS — BP 122/74 | HR 84 | Temp 97.6°F | Wt 268.8 lb

## 2015-08-04 DIAGNOSIS — Z79891 Long term (current) use of opiate analgesic: Secondary | ICD-10-CM

## 2015-08-04 DIAGNOSIS — B373 Candidiasis of vulva and vagina: Secondary | ICD-10-CM | POA: Diagnosis not present

## 2015-08-04 DIAGNOSIS — F419 Anxiety disorder, unspecified: Secondary | ICD-10-CM

## 2015-08-04 DIAGNOSIS — G894 Chronic pain syndrome: Secondary | ICD-10-CM

## 2015-08-04 DIAGNOSIS — F1190 Opioid use, unspecified, uncomplicated: Secondary | ICD-10-CM

## 2015-08-04 DIAGNOSIS — B379 Candidiasis, unspecified: Secondary | ICD-10-CM

## 2015-08-04 MED ORDER — OXYCODONE-ACETAMINOPHEN 5-325 MG PO TABS: 1.0000 | ORAL_TABLET | Freq: Four times a day (QID) | ORAL | Status: DC | PRN

## 2015-08-04 MED ORDER — LORAZEPAM 0.5 MG PO TABS: 0.5000 mg | ORAL_TABLET | Freq: Every day | ORAL | Status: DC | PRN

## 2015-08-04 NOTE — Progress Notes (Signed)
Subjective:    Patient ID: [name], female    DOB: [birth year], 50 y.o.   MRN: [id number]  HPI Comments: [name] is a 50 year old woman with PMH as below here for follow-up of chronic pain.  Please see problem based charting for status of this and other chronic conditions.     Past Medical History  Diagnosis Date  . Anemia 10/2009    macrocytic anemia with baseline MCV 104-106  . PUD (peptic ulcer disease) 07/1997    per EGD report 07/1997 with history of esophagitis  . Migraines     on topamax and triptans, frequent (almost daily) attacks   . Hyperlipidemia   . HIV infection ([facility])     CD4 = 570 (05/2010), VL undetectation  . Anxiety   . Spondylolisthesis of lumbar region     L5-S1  . Gastroparesis   . Shingles     in lumbar dermatome  . Hypertension     no meds  . Non Hodgkin's lymphoma ([facility])     stage II, s/p resection, chemotherapy, radiotherapy, Dr. Beryle Beams is her oncologist.   . Asthma   . DVT (deep venous thrombosis) ([facility])     history clot lt groin when she had lymphoma-in remision now.  . Traumatic tear of lateral meniscus of right knee 04/20/2012  . 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 Hiatal hernia 01/01/2002  . Acute gastritis without mention of hemorrhage 01/01/2002  . Duodenitis without mention of hemorrhage 01/01/2002  . GERD (gastroesophageal reflux disease)   . IBS (irritable bowel syndrome)    Current Outpatient Prescriptions on File Prior to Visit  Medication Sig Dispense Refill  . Beclomethasone Dipropionate 80 MCG/ACT AERS Place 2 Inhalers into the nose daily. 8.7 g 10  . botulinum toxin Type A (BOTOX) 100 UNITS SOLR injection INJECT 200 UNITS IM INTO  HEAD NECK EVERY 3 MONTHS    . Calcium Carbonate-Vitamin D (CALCIUM-VITAMIN D) 500-200 MG-UNIT per tablet Take 2 tablets by mouth daily.     . diclofenac sodium (VOLTAREN) 1 % GEL Apply 2 g topically 4 (four) times daily. 100 g 2  . DULERA 100-5 MCG/ACT AERO INHALE 2 PUFFS FIRST THING IN THE  MORNING AND THEN ANOTHER 2 PUFFS ABOUT 12 HOURS LATER 13 g 11  . emtricitabine-tenofovir AF (DESCOVY) 200-25 MG per tablet Take 1 tablet by mouth daily. 30 tablet 11  . esomeprazole (NEXIUM) 40 MG capsule Take 1 capsule (40 mg total) by mouth 2 (two) times daily before a meal. 60 capsule 11  . fluconazole (DIFLUCAN) 100 MG tablet TAKE 1 TABLET BY MOUTH EVERY DAY 10 tablet 1  . FLUoxetine HCl 60 MG TABS Take 60 mg by mouth daily. 30 tablet 3  . hydrocortisone cream 1 % APPLY TO AFFECTED AREA TWICE A DAY 28 g 2  . LORazepam (ATIVAN) 0.5 MG tablet Take 1 tablet (0.5 mg total) by mouth daily as needed for anxiety. 30 tablet 0  . LORazepam (ATIVAN) 0.5 MG tablet Take 1 tablet (0.5 mg total) by mouth daily as needed for anxiety. 30 tablet 0  . neomycin-bacitracin-polymyxin (NEOSPORIN) [phone] ointment Apply topically 4 (four) times daily. 28.3 g 0  . oxyCODONE-acetaminophen (PERCOCET/ROXICET) 5-325 MG tablet Take 1 tablet by mouth every 6 (six) hours as needed for severe pain. 120 tablet 0  . PREZCOBIX 800-150 MG per tablet TAKE 1 TABLET BY MOUTH EVERY DAY 30 tablet 5  . PROAIR HFA 108 (90 Base) MCG/ACT inhaler USE 1 TO  2 PUFFS EVERY 4 TO 6 HOURS AS NEEDED SHORTNESS OF BREATH OR WHEEZING 8.5 g 11  . rosuvastatin (CRESTOR) 10 MG tablet Take 1 tablet (10 mg total) by mouth daily. 30 tablet 5  . Tamsulosin HCl (FLOMAX) 0.4 MG CAPS Take 0.4 mg by mouth daily. 1-2 daily    . terconazole (TERAZOL 7) 0.4 % vaginal cream Place 1 applicator vaginally at bedtime. 45 g 0  . TRANSDERM-SCOP, 1.5 MG, 1 MG/3DAYS APPLY 1 PATCH ON SKIN EVERY OTHER DAY ASNEEDED 15 patch 6  . zolmitriptan (ZOMIG) 5 MG tablet Take 1 tablet (5 mg total) by mouth as needed for migraine. 15 tablet 11   No current facility-administered medications on file prior to visit.    Review of Systems  Constitutional: Negative for fever, chills and appetite change.  Gastrointestinal: Negative for nausea and vomiting.  Genitourinary: Positive for  vaginal discharge. Negative for dysuria and difficulty urinating.       White vaginal discharge since starting abx  Musculoskeletal: Positive for back pain.       Chronic back pain  Psychiatric/Behavioral:       Depression and anxiety stable.       Filed Vitals:   08/04/15 1108  BP: 122/74  Pulse: 84  Temp: 97.6 F (36.4 C)  TempSrc: Oral  Weight: 268 lb 12.8 oz (121.927 kg)  SpO2: 97%     Objective:   Physical Exam  Constitutional: She is oriented to person, place, and time. She appears well-developed. No distress.  HENT:  Head: Normocephalic and atraumatic.  Mouth/Throat: Oropharynx is clear and moist. No oropharyngeal exudate.  + maxillary sinus TTP  Eyes: Conjunctivae and EOM are normal. Pupils are equal, round, and reactive to light. Right eye exhibits no discharge. Left eye exhibits no discharge. No scleral icterus.  Neck: Normal range of motion. Neck supple.  Cardiovascular: Normal rate, regular rhythm and normal heart sounds.  Exam reveals no gallop and no friction rub.   No murmur heard. Pulmonary/Chest: Breath sounds normal. No respiratory distress. She has no wheezes. She has no rales.  Abdominal: Soft. Bowel sounds are normal. She exhibits no distension. There is no tenderness. There is no rebound and no guarding.  Musculoskeletal: Normal range of motion. She exhibits no edema or tenderness.  Neurological: She is alert and oriented to person, place, and time. No cranial nerve deficit.  Skin: Skin is warm. She is not diaphoretic.  Psychiatric: She has a normal mood and affect. Her behavior is normal. Judgment and thought content normal.  Vitals reviewed.         Assessment & Plan:  Please see problem based charting A&P.

## 2015-08-04 NOTE — Patient Instructions (Signed)
1. I will send in your cream.  If you are still having symptoms of yeast after antibiotic please call and let us know.   2. Please take all medications as prescribed.    3. If you have worsening of your symptoms or new symptoms arise, please call the clinic [phone] or go to the ER immediately if symptoms are severe.   Come back for med refills in April.  Come back to see me in June.

## 2015-08-06 ENCOUNTER — Other Ambulatory Visit: Payer: Self-pay | Admitting: Internal Medicine

## 2015-08-06 DIAGNOSIS — N760 Acute vaginitis: Secondary | ICD-10-CM

## 2015-08-06 MED ORDER — TERCONAZOLE 0.4 % VA CREA: 1.0000 | TOPICAL_CREAM | Freq: Every day | VAGINAL | Status: DC

## 2015-08-06 NOTE — Telephone Encounter (Signed)
Pt requesting Terazol 7 to be filled @ [facility].

## 2015-08-09 DIAGNOSIS — B379 Candidiasis, unspecified: Secondary | ICD-10-CM | POA: Insufficient documentation

## 2015-08-09 MED ORDER — NALOXONE HCL 0.4 MG/ML IJ SOLN: 0.4000 mg | INTRAMUSCULAR | Status: DC | PRN

## 2015-08-09 NOTE — Assessment & Plan Note (Addendum)
Assessment:  Chronic stable pain.  She continues to require opiates for daily activities. Last dose was today.  [facility] database checked and ok last week when I refilled rx (07/24/15 Percocet 5/325 1 tab q6h prn #120).  UDS appropriate in July 2016.  Pain contract signed 05/27/15.  I inadvertently listed Vicodin in that note but she is actually on Percocet. Plan:  Two paper rx given for Percocet 5/325 1 tab q6h prn #120 to fill on or after 08/24/15 and 09/21/15, respectively.  She already picked up rx last week written on 07/24/15 for #120.  Monitor closely for ADRs, worsening depression/anxiety.  Periodic UDS, at least yearly or if red flags.  Attempt to wean in future.  Return to clinic in two months for follow-up and refill.  She is on relatively low total daily dose (20mg  oxycodone = 30 mg morphine) however risk of CNS/resp depression is increased with concurrent benzo use (albeit low dose) so I will send in rx for Narcan for family to have available in case of emergency.  I will ask Dr. Maudie Mercury if she can provide teaching on Narcan administration or perhaps local pharmacy can do this.

## 2015-08-09 NOTE — Assessment & Plan Note (Addendum)
Assessment:  She reports white vaginal discharge she feels is c/w prior yeast infections.  She associates this with recently starting course of azithro prescribed by ENT for sinusitis.  She is requesting terazol vaginal cream which she says has worked well for her in the past.  I see that Diflucan was prescribed by her ID physician three weeks ago.  She says she has completed that course. Plan:  rx sent for Terazol cream x 7 days since she prefers the topical.  Advised she return to clinic if no improvement since she may require extended therapy.

## 2015-08-09 NOTE — Assessment & Plan Note (Addendum)
Assessment:  Mood okay throughout the holidays.  Compliant with Prozac and using prn Ativan.  Had difficulty with trying to taper just before the holidays.  Back on 0.5mg  daily prn. Plan:  Two paper rx provided for Ativan 0.5mg  prn daily #30 for fill now and on or after 09/02/2015, respectively.  Follow-up in two months.

## 2015-08-10 ENCOUNTER — Ambulatory Visit (INDEPENDENT_AMBULATORY_CARE_PROVIDER_SITE_OTHER): Payer: Medicaid Other | Admitting: Infectious Disease

## 2015-08-10 ENCOUNTER — Encounter: Payer: Self-pay | Admitting: Infectious Disease

## 2015-08-10 VITALS — BP 126/76 | HR 74 | Temp 97.6°F | Wt 288.0 lb

## 2015-08-10 DIAGNOSIS — E781 Pure hyperglyceridemia: Secondary | ICD-10-CM | POA: Diagnosis not present

## 2015-08-10 DIAGNOSIS — Z6841 Body Mass Index (BMI) 40.0 and over, adult: Secondary | ICD-10-CM | POA: Diagnosis not present

## 2015-08-10 DIAGNOSIS — B379 Candidiasis, unspecified: Secondary | ICD-10-CM

## 2015-08-10 DIAGNOSIS — A499 Bacterial infection, unspecified: Secondary | ICD-10-CM

## 2015-08-10 DIAGNOSIS — B9689 Other specified bacterial agents as the cause of diseases classified elsewhere: Secondary | ICD-10-CM

## 2015-08-10 DIAGNOSIS — B20 Human immunodeficiency virus [HIV] disease: Secondary | ICD-10-CM | POA: Diagnosis present

## 2015-08-10 DIAGNOSIS — J329 Chronic sinusitis, unspecified: Secondary | ICD-10-CM | POA: Diagnosis not present

## 2015-08-10 MED ORDER — FLUCONAZOLE 100 MG PO TABS: 100.0000 mg | ORAL_TABLET | Freq: Every day | ORAL | Status: DC

## 2015-08-10 NOTE — Progress Notes (Signed)
Chief complaints: recent URI, apparent bacterial sinusitis, bronchitis O media, vaginal yeast infection Subjective:    Patient ID: [name], female    DOB: 23-Aug-1965, 50 y.o.   MRN: [id number]  HPI   [name] is a 50 y.o. female who is doing superbly well on her  antiviral regimen, Prezcobix and Descovy with undetectable viral load and health cd4 count.   Once again [facility] had myriad of ID complaints. I suspect that many of her URI infections have been protracted viral syndromes rather than actual bacterial ones though along the way she did apparently need to see Dr Erik Obey with ENT.  She states that she is currently suffering from another vaginal yeast infection.  Lab Results  Component Value Date   HIV1RNAQUANT <20 07/28/2015   Lab Results  Component Value Date   CD4TABS 970 07/28/2015   CD4TABS 790 03/03/2015   CD4TABS 680 09/01/2014      Past Medical History  Diagnosis Date  . Anemia 10/2009    macrocytic anemia with baseline MCV 104-106  . PUD (peptic ulcer disease) 07/1997    per EGD report 07/1997 with history of esophagitis  . Migraines     on topamax and triptans, frequent (almost daily) attacks   . Hyperlipidemia   . HIV infection ([place])     CD4 = 570 (05/2010), VL undetectation  . Anxiety   . Spondylolisthesis of lumbar region     L5-S1  . Gastroparesis   . Shingles     in lumbar dermatome  . Hypertension     no meds  . Non Hodgkin's lymphoma ([place])     stage II, s/p resection, chemotherapy, radiotherapy, Dr. Beryle Beams is her oncologist.   . Asthma   . DVT (deep venous thrombosis) (H[facility])     history clot lt groin when she had lymphoma-in remision now.  . Traumatic tear of lateral meniscus of right knee 04/20/2012  . 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 Hiatal hernia 01/01/2002  . Acute gastritis without mention of hemorrhage 01/01/2002  . Duodenitis without mention of hemorrhage 01/01/2002  . GERD (gastroesophageal reflux disease)   . IBS  (irritable bowel syndrome)    Past Surgical History  Procedure Laterality Date  . Upper gastrointestinal endoscopy  2003, 2007    done by Dr. Velora Heckler  . Tonsillectomy    . Endometrial ablation    . Tympanostomy tube placement      as child-both  . Tympanoplasty Left   . Cholecystectomy    . Knee arthroscopy Right 04/20/12   Family History  Problem Relation Age of Onset  . Hypertension Mother   . Stroke Mother   . Heart disease Mother   . Breast cancer Paternal Grandmother   . Kidney cancer Maternal Uncle   . Heart disease Maternal Uncle   . Diabetes Maternal Uncle   . Diabetes Maternal Grandmother   . Heart disease Maternal Grandmother   . Emphysema Paternal Uncle     never smoker  . Emphysema Paternal Grandfather     never smoker  . Colon cancer Paternal Aunt     with met to lung   . Non-Hodgkin's lymphoma Maternal Uncle   . Heart disease Maternal Grandfather    Social History  Substance Use Topics  . Smoking status: Never Smoker   . Smokeless tobacco: Never Used  . Alcohol Use: No   Allergies  Allergen Reactions  . Diazepam     Triggers migraines  . Divalproex Sodium   .  Gabapentin Nausea And Vomiting  . Ondansetron Hcl     Triggers migraines, "makes me vomit"  . Penicillins     REACTION: lips swollen to point of bleeding, sever vaginal irritation  Reports tolerating augmentin well without any complaints or reaction  . Propoxyphene N-Acetaminophen Nausea And Vomiting  . Simvastatin     PATIENT CANNOT BE PRESCRIBED THIS STATIN WHILE RECEIVING NORVIR  . Topamax [Topiramate]     Migraines   . Tramadol Hcl     REACTION: migraines and itching--hx of migraines even without medication   . Zocor [Simvastatin - High Dose]      Current outpatient prescriptions:  .  Beclomethasone Dipropionate 80 MCG/ACT AERS, Place 2 Inhalers into the nose daily., Disp: 8.7 g, Rfl: 10 .  botulinum toxin Type A (BOTOX) 100 UNITS SOLR injection, INJECT 200 UNITS IM INTO  HEAD  NECK EVERY 3 MONTHS, Disp: , Rfl:  .  Calcium Carbonate-Vitamin D (CALCIUM-VITAMIN D) 500-200 MG-UNIT per tablet, Take 2 tablets by mouth daily. , Disp: , Rfl:  .  diclofenac sodium (VOLTAREN) 1 % GEL, Apply 2 g topically 4 (four) times daily., Disp: 100 g, Rfl: 2 .  DULERA 100-5 MCG/ACT AERO, INHALE 2 PUFFS FIRST THING IN THE MORNING AND THEN ANOTHER 2 PUFFS ABOUT 12 HOURS LATER, Disp: 13 g, Rfl: 11 .  emtricitabine-tenofovir AF (DESCOVY) 200-25 MG per tablet, Take 1 tablet by mouth daily., Disp: 30 tablet, Rfl: 11 .  esomeprazole (NEXIUM) 40 MG capsule, Take 1 capsule (40 mg total) by mouth 2 (two) times daily before a meal., Disp: 60 capsule, Rfl: 11 .  FLUoxetine HCl 60 MG TABS, Take 60 mg by mouth daily., Disp: 30 tablet, Rfl: 3 .  hydrocortisone cream 1 %, APPLY TO AFFECTED AREA TWICE A DAY, Disp: 28 g, Rfl: 2 .  LORazepam (ATIVAN) 0.5 MG tablet, Take 1 tablet (0.5 mg total) by mouth daily as needed for anxiety., Disp: 30 tablet, Rfl: 0 .  LORazepam (ATIVAN) 0.5 MG tablet, Take 1 tablet (0.5 mg total) by mouth daily as needed for anxiety., Disp: 30 tablet, Rfl: 0 .  naloxone (NARCAN) 0.4 MG/ML injection, Inject 1 mL (0.4 mg total) into the muscle as needed., Disp: 1 mL, Rfl: 3 .  neomycin-bacitracin-polymyxin (NEOSPORIN) [phone] ointment, Apply topically 4 (four) times daily., Disp: 28.3 g, Rfl: 0 .  oxyCODONE-acetaminophen (PERCOCET/ROXICET) 5-325 MG tablet, Take 1 tablet by mouth every 6 (six) hours as needed for severe pain., Disp: 120 tablet, Rfl: 0 .  PREZCOBIX 800-150 MG per tablet, TAKE 1 TABLET BY MOUTH EVERY DAY, Disp: 30 tablet, Rfl: 5 .  PROAIR HFA 108 (90 Base) MCG/ACT inhaler, USE 1 TO 2 PUFFS EVERY 4 TO 6 HOURS AS NEEDED SHORTNESS OF BREATH OR WHEEZING, Disp: 8.5 g, Rfl: 11 .  rosuvastatin (CRESTOR) 10 MG tablet, Take 1 tablet (10 mg total) by mouth daily., Disp: 30 tablet, Rfl: 5 .  Tamsulosin HCl (FLOMAX) 0.4 MG CAPS, Take 0.4 mg by mouth daily. 1-2 daily, Disp: , Rfl:  .   terconazole (TERAZOL 7) 0.4 % vaginal cream, Place 1 applicator vaginally at bedtime. Use for 7 days., Disp: 45 g, Rfl: 0 .  TRANSDERM-SCOP, 1.5 MG, 1 MG/3DAYS, APPLY 1 PATCH ON SKIN EVERY OTHER DAY ASNEEDED, Disp: 15 patch, Rfl: 6 .  zolmitriptan (ZOMIG) 5 MG tablet, Take 1 tablet (5 mg total) by mouth as needed for migraine., Disp: 15 tablet, Rfl: 11 .  fluconazole (DIFLUCAN) 100 MG tablet, Take 1 tablet (100 mg  total) by mouth daily., Disp: 14 tablet, Rfl: 4     Review of Systems  Constitutional: Positive for diaphoresis and fatigue. Negative for fever, chills, activity change, appetite change and unexpected weight change.  HENT: Negative for congestion, facial swelling, postnasal drip, rhinorrhea, sinus pressure, sneezing, sore throat and trouble swallowing.   Eyes: Negative for photophobia and visual disturbance.  Respiratory: Positive for cough, shortness of breath and wheezing. Negative for apnea, chest tightness and stridor.   Cardiovascular: Negative for chest pain and leg swelling.  Gastrointestinal: Negative for nausea, vomiting, abdominal pain, diarrhea, constipation, blood in stool, abdominal distention and anal bleeding.  Genitourinary: Negative for dysuria, hematuria, flank pain and difficulty urinating.  Musculoskeletal: Positive for myalgias and back pain. Negative for joint swelling, arthralgias and gait problem.  Skin: Negative for color change, pallor, rash and wound.  Neurological: Positive for headaches. Negative for dizziness, tremors, weakness and light-headedness.  Hematological: Negative for adenopathy. Does not bruise/bleed easily.  Psychiatric/Behavioral: Positive for dysphoric mood. Negative for suicidal ideas, hallucinations, behavioral problems, confusion, sleep disturbance, self-injury and agitation. The patient is not hyperactive.        Objective:   Physical Exam  Constitutional: She is oriented to person, place, and time. She appears well-developed and  well-nourished. No distress.  HENT:  Head: Normocephalic and atraumatic.  Mouth/Throat: Oropharynx is clear and moist. No oropharyngeal exudate, posterior oropharyngeal edema, posterior oropharyngeal erythema or tonsillar abscesses.  Eyes: Conjunctivae and EOM are normal. Pupils are equal, round, and reactive to light. No scleral icterus.  Neck: Normal range of motion. Neck supple. No JVD present.  Cardiovascular: Normal rate, regular rhythm and normal heart sounds.  Exam reveals no gallop and no friction rub.   No murmur heard. Pulmonary/Chest: Effort normal and breath sounds normal. No respiratory distress. She has no wheezes. She has no rales. She exhibits no tenderness.  Abdominal: She exhibits no distension and no mass. There is no tenderness. There is no rebound and no guarding.  Musculoskeletal: She exhibits no edema or tenderness.  Lymphadenopathy:    She has no cervical adenopathy.  Neurological: She is alert and oriented to person, place, and time. She has normal reflexes. She exhibits normal muscle tone. Coordination normal.  Skin: Skin is warm and dry. She is not diaphoretic. No erythema. No pallor.  Psychiatric: Her speech is normal. Judgment and thought content normal.  Nursing note and vitals reviewed.         Assessment & Plan:  HIV: continue  Prezcobix and to Descovy (for better bone and kidney health an RTC in October. Because [name] has now for nearly 8 years had consistent control of her virus after failing Atripla with resistance I am comfortable switching to seeing her once yearly after that appt  Vaginal yeast infection: will give her fluconazole for 2 weeks again MORE than necessary. I have provided refills.   Hypertriglyceridemia: continue statin, fibrate and I wrote for fasting panel for her to come in for next week  Migraines: followed closely by Neurology   Obese: while she has several relative with DM she is not succumbed to it. Still very good idea for  her to lose weight  Vaginal yeast infection : In the past was perseverating on perceive thrush in oropharynx now  asking for this fluconazole for vaginal yeast infections with refills again  "in case" I still  think this is a psychosomatic component but will fill meds  Yet again  URI, sinusitis, bronchitiis, OM: she has had mx  rounds of abx and would not advocate any further  Asthma: continue to take her meds for this and seaonal allergies  I spent greater than 40 minutes with the patient including greater than 50% of time in face to face counsel of the patient re her HIV, migraines, , hypertriglyceridiemia, obesity, and yeast infection URI, sinuisitis, bronchitis, otitis media  and in coordination of their care.

## 2015-08-11 ENCOUNTER — Telehealth: Payer: Self-pay | Admitting: Pharmacist

## 2015-08-11 NOTE — Progress Notes (Signed)
Internal Medicine Clinic Attending  Case discussed with Dr. Wilson soon after the resident saw the patient.  We reviewed the resident's history and exam and pertinent patient test results.  I agree with the assessment, diagnosis, and plan of care documented in the resident's note.  

## 2015-08-19 NOTE — Telephone Encounter (Signed)
Unable to reach patient to clarify whether she was able to fill Rx for naloxone. Pharmacy was able to fill for $3 copay and will contact patient.

## 2015-08-20 ENCOUNTER — Encounter: Payer: Self-pay | Admitting: *Deleted

## 2015-08-20 ENCOUNTER — Telehealth: Payer: Self-pay | Admitting: Neurology

## 2015-08-20 ENCOUNTER — Other Ambulatory Visit: Payer: Medicaid Other

## 2015-08-20 NOTE — Telephone Encounter (Signed)
Pt called and needs PA on zolmitriptan (ZOMIG) 5 MG tablet. Pt is requesting to call and update her. [phone]

## 2015-08-20 NOTE — Telephone Encounter (Signed)
PA approved - documented in med list - patient and pharmacy aware.

## 2015-08-27 ENCOUNTER — Other Ambulatory Visit: Payer: Medicaid Other

## 2015-09-01 ENCOUNTER — Telehealth: Payer: Self-pay | Admitting: Neurology

## 2015-09-02 ENCOUNTER — Ambulatory Visit: Payer: Medicaid Other | Admitting: Neurology

## 2015-09-16 ENCOUNTER — Other Ambulatory Visit: Payer: Self-pay | Admitting: Sports Medicine

## 2015-09-17 ENCOUNTER — Encounter: Payer: Self-pay | Admitting: Neurology

## 2015-09-17 ENCOUNTER — Ambulatory Visit (INDEPENDENT_AMBULATORY_CARE_PROVIDER_SITE_OTHER): Payer: Medicaid Other | Admitting: Neurology

## 2015-09-17 VITALS — BP 128/90 | HR 94 | Ht 66.0 in

## 2015-09-17 DIAGNOSIS — G43719 Chronic migraine without aura, intractable, without status migrainosus: Secondary | ICD-10-CM

## 2015-09-17 MED ORDER — SUMATRIPTAN SUCCINATE 6 MG/0.5ML ~~LOC~~ SOAJ: 6.0000 mg | SUBCUTANEOUS | Status: DC | PRN

## 2015-09-17 MED ORDER — PROCHLORPERAZINE MALEATE 10 MG PO TABS: 10.0000 mg | ORAL_TABLET | Freq: Four times a day (QID) | ORAL | Status: DC | PRN

## 2015-09-17 NOTE — Progress Notes (Signed)
**  Botox 100 units x 2, Lot [id number], Exp 03/2018, [place] [id number], specialty pharmacy**mck,rn

## 2015-09-17 NOTE — Progress Notes (Signed)
HPI:   [name] is a 50 year old Caucasian female with past medical history of HIV, hyperlipidemia, migraines, depression, and non-Hodgkin lymphoma in 2005 with chemotherapy and radiation.   She has history of migraines since 19. In the last 10 years, she reports having daily migraines. She is currently taking Topamax and Zomig with relief. She takes approximately 20-25 tablets of her Zomig per month.    She has aura's that look like floating dirt particles, flashing lights, and she sees Christmas tree lights when they are severe, she has nausea, vomiting and sometimes diarrhea when they're severe. She also complains of diplopia, blurry vision, and  photophobia. She also has had a lightning bolt sensation throughout her body.  She describes them as pulsating and throbbing  on a scale of 5-6/10 which she considers mild, when they are severe they can go as high as 10/10. They usually start in the right temporal area, but will also start on the left temporal and are worse.   Any type of smoke or MSG can precipitate a migraine. They generally last 2-9 days. She has been to the emergency department twice in the last year for migraines. She states "I just can't do this anymore I need to do something about her migraines"  She has tried and failed different preventive medications:  atenolol, verapamil, nortriptyline, Inderal, Depakote without benefit. Atenolol and verapamil caused hypotension. Depakote caused severe vomiting. Topamax causing her worsening headaches, will show tried Zonegran, Topamax ER, she denies significant improvement.  For abortive treatment, she has tried Imitrex, Maxalt with suboptimal response, Zomig as needed since to work best,  she also takes transderm scophalamine patch for nause  I started to give her BOTOX injection since May 2013, every 3 months, she responded very well,   Previous MRI brain was normal in 2013.  UPDATE May 25th 2016: Last Botox injection was in Feb 2016,  complains of soreness all over her head, lasting for 3 days, which has helped her headaches, she still gets headaches at least 6 each month, she is taking Zomig as needed, which has been helpful,  She has tried Inderal as preventive medication, complains of fatigue, at the same time difficulty falling to sleep, verapamil was helpful to her migraine the past I started verapamil 120 mg every day since February, which did not help, she is no longer taking it.  UPDATE Mar 05 2015: She is under a lot stress, is tearful during today's interview, her father passed away in February 27, 2015, she had frequent headaches over the past few weeks, but overall, previous Botox injection in Nov 26 2014 has been very helpful, she gets milder headaches has been taking frequent aspirin, also Zomig as needed, she also takes transderm scophalamine patch for nause   Update September 17 2015: Last injection was December 2017, she is few days off schedule, in past week, she complains severe prolonged migraine headaches, holoacranial, with associated light noise sensitivity, nauseous, she has tried multiple dose of Zomig without significant improvement,   Physical Exam  General: Well groomed and obese female Neck: supple no carotid bruits Respiratory: clear to auscultation bilaterally Cardiovascular: regular rate rhythm She wear right knee brace  Neurologic Exam  Mental Status: obese, pleasant, awake, alert, cooperative to history, talking, and casual conversation. Cranial Nerves: CN II-XII pupils were equal round reactive to light. Extraocular movements were full.  Visual fields were full on confrontational test.  Facial sensation and strength were normal.   Uvula tongue were midline.  Head turning  and shoulder shrugging were normal and symmetric.  Tongue protrusion into the cheeks strength were normal.  Motor: Normal tone, bulk, and strength. Sensory: Normal to light touch, pinprick, proprioception except decreased vibratory  sensation to her right lower extremity she relates to peripheral neuropathy.  Decreased vibratory sensation to left upper extremity.   Coordination: There was no dysmetria noticed. Gait and Station: Narrow based and steady,  Reflexes: Deep tendon reflexes: present and symmetric.  Plantar responses are flexor.   Assessment and plan: 50 year-old Caucasian female with a history of HIV, hypercholesterolemia, Non hodgkin' lymphoma with chemotherapy and radiation and migraines since age 66.   She has tried and failed multiple preventive medications including atenolol, verapamil, nortriptyline, and Depakote,Topamax, Topamax ER,   She has migraines daily for more than  10 years. She is currently taking  zonogram  and Zomig with some relief. Previously tried imitrex and Maxalt with suboptimal response,  Normal MRI brain, and neurological exam.  BOTOX injection was performed according to protocol by [employer]. 100 units of BOTOX was dissolved into 2 cc NS.   Total of 200 units  Frontalis 4 sites,  20 units, Temporalis 8 sites,  40 units  Occipitalis 6 sites, 30 units Cervical Paraspinal, 4 sites, 20 units Trapezius, 6 sites, 30 units  Extra 60 units to cervical paraspinals  Patient tolerate the injection well. Will return for repeat injection in 3 months.  Continue Zomig as needed, I also write Compazine 10 mg, Imitrex injection 6 mg as needed for severe prolonged headaches  Marcial Pacas, M.D. Ph.D.  [facility] [place], [place] [address] Phone: [phone] Fax:      [phone] HPI:   [name] is a 50 year old Caucasian female with past medical history of HIV, hyperlipidemia, migraines, depression, and non-Hodgkin lymphoma in 2005 with chemotherapy and radiation.   She has history of migraines since 19. In the last 10 years, she reports having daily migraines. She is currently taking Topamax and Zomig with relief. She takes approximately 20-25 tablets of her Zomig  per month.    She has aura's that look like floating dirt particles, flashing lights, and she sees Christmas tree lights when they are severe, she has nausea, vomiting and sometimes diarrhea when they're severe. She also complains of diplopia, blurry vision, and  photophobia. She also has had a lightning bolt sensation throughout her body.  She describes them as pulsating and throbbing  on a scale of 5-6/10 which she considers mild, when they are severe they can go as high as 10/10. They usually start in the right temporal area, but will also start on the left temporal and are worse.   Any type of smoke or MSG can precipitate a migraine. They generally last 2-9 days. She has been to the emergency department twice in the last year for migraines. She states "I just can't do this anymore I need to do something about her migraines"  She has tried and failed different preventive medications:  atenolol, verapamil, nortriptyline, Inderal, Depakote without benefit. Atenolol and verapamil caused hypotension. Depakote caused severe vomiting. Topamax causing her worsening headaches, will show tried Zonegran, Topamax ER, she denies significant improvement.  For abortive treatment, she has tried Imitrex, Maxalt with suboptimal response, Zomig as needed since to work best,  she also takes transderm scophalamine patch for nause  I started to give her BOTOX injection since May 2013, every 3 months, she responded very well,   Previous MRI brain was normal in 2013.  UPDATE May 25th 2016: Last Botox injection was in Feb 2016, complains of soreness all over her head, lasting for 3 days, which has helped her headaches, she still gets headaches at least 6 each month, she is taking Zomig as needed, which has been helpful,  She has tried Inderal as preventive medication, complains of fatigue, at the same time difficulty falling to sleep, verapamil was helpful to her migraine the past I started verapamil 120 mg every day  since February, which did not help, she is no longer taking it.  UPDATE Mar 05 2015: She is under a lot stress, is tearful during today's interview, her father passed away in 02-10-15, she had frequent headaches over the past few weeks, but overall, previous Botox injection in Nov 26 2014 has been very helpful, she gets milder headaches has been taking frequent aspirin, also Zomig as needed, she also takes transderm scophalamine patch for nause  UPDATE Jun 08 2015: She responded to injection in September 2016 very well, but noticed wearing of in the last couple weeks, having daily headaches in December 2016, she has lost a lot of family members, going through a lot of emotional stress    Physical Exam  General: Well groomed and obese female Neck: supple no carotid bruits Respiratory: clear to auscultation bilaterally Cardiovascular: regular rate rhythm She wear right knee brace  Neurologic Exam  Mental Status: obese, pleasant, awake, alert, cooperative to history, talking, and casual conversation. Cranial Nerves: CN II-XII pupils were equal round reactive to light. Extraocular movements were full.  Visual fields were full on confrontational test.  Facial sensation and strength were normal.   Uvula tongue were midline.  Head turning and shoulder shrugging were normal and symmetric.  Tongue protrusion into the cheeks strength were normal.  Motor: Normal tone, bulk, and strength. Sensory: Normal to light touch, pinprick, proprioception except decreased vibratory sensation to her right lower extremity she relates to peripheral neuropathy.  Decreased vibratory sensation to left upper extremity.   Coordination: There was no dysmetria noticed. Gait and Station: Narrow based and steady,  Reflexes: Deep tendon reflexes: present and symmetric.  Plantar responses are flexor.   Assessment and plan: 50 year-old Caucasian female with a history of HIV, hypercholesterolemia, Non hodgkin' lymphoma with  chemotherapy and radiation and migraines since age 64.   She has tried and failed multiple preventive medications including atenolol, verapamil, nortriptyline, and Depakote,Topamax, Topamax ER,   She has migraines daily for more than  10 years. She is currently taking  zonogram  and Zomig with some relief. Previously tried imitrex and Maxalt with suboptimal response,  Normal MRI brain, and neurological exam.  BOTOX injection was performed according to protocol by [employer]. 100 units of BOTOX was dissolved into 2 cc NS  Used total 200 units  Corrugator 2 sites, 10 units Procerus 1 site, 5 unit Frontalis 4 sites,  20 units, Temporalis 8 sites,  40 units  Occipitalis 6 sites, 30 units Cervical Paraspinal, 4 sites, 20 units Trapezius, 6 sites, 30 units  Extra 45 units were injected along bilateral cervical paraspinal muscles  Patient tolerate the injection well. Will return for repeat injection in 3 months.  Patient tolerate the injection well. Will return for repeat injection in 3 months.  I refilled her Zomig as needed  Marcial Pacas, M.D. Ph.D.  [facility] [place], [place] [address] Phone: [phone] Fax:      [phone]

## 2015-10-06 ENCOUNTER — Telehealth: Payer: Self-pay | Admitting: Neurology

## 2015-10-06 ENCOUNTER — Other Ambulatory Visit: Payer: Medicaid Other

## 2015-10-06 ENCOUNTER — Other Ambulatory Visit (HOSPITAL_COMMUNITY)
Admission: RE | Admit: 2015-10-06 | Discharge: 2015-10-06 | Disposition: A | Discharge status: Home / Self Care | Payer: Medicaid Other | Source: Ambulatory Visit | Attending: Infectious Disease | Admitting: Infectious Disease

## 2015-10-06 DIAGNOSIS — Z113 Encounter for screening for infections with a predominantly sexual mode of transmission: Secondary | ICD-10-CM | POA: Diagnosis present

## 2015-10-06 DIAGNOSIS — B20 Human immunodeficiency virus [HIV] disease: Secondary | ICD-10-CM

## 2015-10-06 NOTE — Telephone Encounter (Signed)
Left message for a return call

## 2015-10-06 NOTE — Telephone Encounter (Signed)
I called patient to schedule next injection. She stated that some of the medications she had been given by Dr. Krista Blue need PA's. She also wants to ask about some questions she has regarding the injection she is taking and if it will interfere with another medication she is using. Please call and advise.

## 2015-10-06 NOTE — Telephone Encounter (Signed)
She was unable to get her Imitrex and Zomig prescriptions filled at the same time.  I explained to her that no PA is required but Medicaid only allows one or the other every 30 days.  She expressed understanding and had no further questions.

## 2015-10-07 LAB — CBC WITH DIFFERENTIAL/PLATELET
BASOS PCT: 0 %
Basophils Absolute: 0 cells/uL (ref 0–200)
EOS PCT: 2 %
Eosinophils Absolute: 106 cells/uL (ref 15–500)
HCT: 39.5 % (ref 35.0–45.0)
Hemoglobin: 12.6 g/dL (ref 11.7–15.5)
LYMPHS PCT: 34 %
Lymphs Abs: 1802 cells/uL (ref 850–3900)
MCH: 26.8 pg — ABNORMAL LOW (ref 27.0–33.0)
MCHC: 31.9 g/dL — ABNORMAL LOW (ref 32.0–36.0)
MCV: 83.9 fL (ref 80.0–100.0)
MONOS PCT: 4 %
MPV: 8.4 fL (ref 7.5–12.5)
Monocytes Absolute: 212 cells/uL (ref 200–950)
Neutro Abs: 3180 cells/uL (ref 1500–7800)
Neutrophils Relative %: 60 %
PLATELETS: 184 10*3/uL (ref 140–400)
RBC: 4.71 MIL/uL (ref 3.80–5.10)
RDW: 15.2 % — AB (ref 11.0–15.0)
WBC: 5.3 10*3/uL (ref 3.8–10.8)

## 2015-10-07 LAB — T-HELPER CELL (CD4) - (RCID CLINIC ONLY)
CD4 % Helper T Cell: 32 % — ABNORMAL LOW (ref 33–55)
CD4 T CELL ABS: 590 /uL (ref 400–2700)

## 2015-10-07 LAB — URINE CYTOLOGY ANCILLARY ONLY
Chlamydia: NEGATIVE
Neisseria Gonorrhea: NEGATIVE

## 2015-10-07 LAB — COMPLETE METABOLIC PANEL WITH GFR
ALT: 13 U/L (ref 6–29)
AST: 18 U/L (ref 10–35)
Albumin: 4 g/dL (ref 3.6–5.1)
Alkaline Phosphatase: 53 U/L (ref 33–115)
BUN: 11 mg/dL (ref 7–25)
CHLORIDE: 104 mmol/L (ref 98–110)
CO2: 26 mmol/L (ref 20–31)
Calcium: 9.1 mg/dL (ref 8.6–10.2)
Creat: 0.72 mg/dL (ref 0.50–1.10)
GFR, Est African American: >89 mL/min (ref >=60)
GFR, Est Non African American: >89 mL/min (ref >=60)
Glucose, Bld: 107 mg/dL — ABNORMAL HIGH (ref 65–99)
Potassium: 4 mmol/L (ref 3.5–5.3)
Sodium: 140 mmol/L (ref 135–146)
Total Bilirubin: 0.3 mg/dL (ref 0.2–1.2)
Total Protein: 6.6 g/dL (ref 6.1–8.1)

## 2015-10-07 LAB — MICROALBUMIN / CREATININE URINE RATIO
Creatinine, Urine: 62 mg/dL (ref 20–320)
MICROALB UR: 0.2 mg/dL
MICROALB/CREAT RATIO: 3 ug/mg{creat} (ref <30)

## 2015-10-07 LAB — LIPID PANEL
Cholesterol: 251 mg/dL — ABNORMAL HIGH (ref 125–200)
HDL: 42 mg/dL — ABNORMAL LOW (ref >=46)
TRIGLYCERIDES: 452 mg/dL — AB (ref <150)
Total CHOL/HDL Ratio: 6 Ratio — ABNORMAL HIGH (ref <=5.0)

## 2015-10-07 LAB — RPR

## 2015-10-07 LAB — HIV-1 RNA QUANT-NO REFLEX-BLD: HIV 1 RNA Quant: <20 copies/mL (ref <20)

## 2015-10-14 ENCOUNTER — Ambulatory Visit (HOSPITAL_BASED_OUTPATIENT_CLINIC_OR_DEPARTMENT_OTHER)
Admission: RE | Admit: 2015-10-14 | Discharge: 2015-10-14 | Disposition: A | Discharge status: Home / Self Care | Payer: Medicaid Other | Source: Ambulatory Visit | Attending: Family Medicine | Admitting: Family Medicine

## 2015-10-14 ENCOUNTER — Encounter: Payer: Self-pay | Admitting: Family Medicine

## 2015-10-14 ENCOUNTER — Ambulatory Visit (INDEPENDENT_AMBULATORY_CARE_PROVIDER_SITE_OTHER): Payer: Medicaid Other | Admitting: Family Medicine

## 2015-10-14 VITALS — BP 143/90 | HR 72 | Ht 66.0 in | Wt 265.0 lb

## 2015-10-14 DIAGNOSIS — M79605 Pain in left leg: Secondary | ICD-10-CM

## 2015-10-14 DIAGNOSIS — M25562 Pain in left knee: Secondary | ICD-10-CM

## 2015-10-14 MED ORDER — METHYLPREDNISOLONE ACETATE 40 MG/ML IJ SUSP
40.0000 mg | Freq: Once | INTRAMUSCULAR | Status: AC
  Administered 2015-10-14: 40 mg via INTRA_ARTICULAR

## 2015-10-14 NOTE — Patient Instructions (Signed)
Get the ultrasound downstairs at 430pm - we will call you with the results. You were given a cortisone injection - these can take a few days to kick in. These are the different classes of medicine you can take for this: Tylenol 500mg  1-2 tabs three times a day for pain. Aleve 1-2 tabs twice a day with food Glucosamine sulfate 750mg  twice a day is a supplement that may help. Capsaicin, aspercreme, or biofreeze topically up to four times a day may also help with pain. If cortisone injections do not help, there are different types of shots that may help but they take longer to take effect. It's important that you continue to stay active. Straight leg raises, knee extensions 3 sets of 10 once a day (add ankle weight if these become too easy). Consider physical therapy to strengthen muscles around the joint that hurts to take pressure off of the joint itself. Shoe inserts with good arch support may be helpful. Walker or cane if needed. Heat or ice 15 minutes at a time 3-4 times a day as needed to help with pain. Call me in 1-2 weeks otherwise for an update on your status.

## 2015-10-15 DIAGNOSIS — M25562 Pain in left knee: Secondary | ICD-10-CM | POA: Insufficient documentation

## 2015-10-15 NOTE — Assessment & Plan Note (Signed)
having worsening posterior knee pain with fullness here, pain more than would expect from DJD but doppler u/s negative.  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  We discussed tylenol, aleve, glucosamine, topical medications.  Continue her percocet.  Shown home exercises to do daily.  We could consider MRI if she's not improving as expected.  After informed written consent, patient was seated on exam table. Left knee was prepped with alcohol swab and utilizing anteromedial approach, patient's left knee was injected intraarticularly with 3:1 marcaine: depomedrol. Patient tolerated the procedure well without immediate complications.

## 2015-10-15 NOTE — Progress Notes (Signed)
PCP: Duwaine Maxin, DO  Subjective:   HPI: Patient is a 50 y.o. female here for left knee pain.  Patient reports she's had 2 weeks of left leg, knee pain. Prior to this had some aching in left thigh that resolved, was achy. Pain now anterior to posterior left knee. Pain level is 6/10, sharp. Associated swelling, tightness. Knee pops, locks, catches. Difficulty putting a lot of pressure on this. Takes percocet for chronic pain. Has been icing, using heat, and elevating. Feels like swelling occurs posterior left knee. No skin changes, numbness.  Past Medical History  Diagnosis Date  . Anemia 10/2009    macrocytic anemia with baseline MCV 104-106  . PUD (peptic ulcer disease) 07/1997    per EGD report 07/1997 with history of esophagitis  . Migraines     on topamax and triptans, frequent (almost daily) attacks   . Hyperlipidemia   . HIV infection ([place])     CD4 = 570 (05/2010), VL undetectation  . Anxiety   . Spondylolisthesis of lumbar region     L5-S1  . Gastroparesis   . Shingles     in lumbar dermatome  . Hypertension     no meds  . Non Hodgkin's lymphoma ([place])     stage II, s/p resection, chemotherapy, radiotherapy, Dr. Beryle Beams is her oncologist.   . Asthma   . DVT (deep venous thrombosis) (HCC)     history clot lt groin when she had lymphoma-in remision now.  . Traumatic tear of lateral meniscus of right knee 04/20/2012  . 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 Hiatal hernia 01/01/2002  . Acute gastritis without mention of hemorrhage 01/01/2002  . Duodenitis without mention of hemorrhage 01/01/2002  . GERD (gastroesophageal reflux disease)   . IBS (irritable bowel syndrome)     Current Outpatient Prescriptions on File Prior to Visit  Medication Sig Dispense Refill  . Beclomethasone Dipropionate 80 MCG/ACT AERS Place 2 Inhalers into the nose daily. 8.7 g 10  . botulinum toxin Type A (BOTOX) 100 UNITS SOLR injection INJECT 200 UNITS IM INTO  HEAD NECK EVERY 3  MONTHS    . Calcium Carbonate-Vitamin D (CALCIUM-VITAMIN D) 500-200 MG-UNIT per tablet Take 2 tablets by mouth daily.     . DULERA 100-5 MCG/ACT AERO INHALE 2 PUFFS FIRST THING IN THE MORNING AND THEN ANOTHER 2 PUFFS ABOUT 12 HOURS LATER 13 g 11  . emtricitabine-tenofovir AF (DESCOVY) 200-25 MG per tablet Take 1 tablet by mouth daily. 30 tablet 11  . esomeprazole (NEXIUM) 40 MG capsule Take 1 capsule (40 mg total) by mouth 2 (two) times daily before a meal. 60 capsule 11  . fluconazole (DIFLUCAN) 100 MG tablet Take 1 tablet (100 mg total) by mouth daily. 14 tablet 4  . FLUoxetine HCl 60 MG TABS Take 60 mg by mouth daily. 30 tablet 3  . hydrocortisone cream 1 % APPLY TO AFFECTED AREA TWICE A DAY 28 g 2  . LORazepam (ATIVAN) 0.5 MG tablet Take 1 tablet (0.5 mg total) by mouth daily as needed for anxiety. 30 tablet 0  . LORazepam (ATIVAN) 0.5 MG tablet Take 1 tablet (0.5 mg total) by mouth daily as needed for anxiety. 30 tablet 0  . naloxone (NARCAN) 0.4 MG/ML injection Inject 1 mL (0.4 mg total) into the muscle as needed. 1 mL 3  . neomycin-bacitracin-polymyxin (NEOSPORIN) [phone] ointment Apply topically 4 (four) times daily. 28.3 g 0  . oxyCODONE-acetaminophen (PERCOCET/ROXICET) 5-325 MG tablet Take 1 tablet by mouth every  6 (six) hours as needed for severe pain. 120 tablet 0  . PREZCOBIX 800-150 MG per tablet TAKE 1 TABLET BY MOUTH EVERY DAY 30 tablet 5  . PROAIR HFA 108 (90 Base) MCG/ACT inhaler USE 1 TO 2 PUFFS EVERY 4 TO 6 HOURS AS NEEDED SHORTNESS OF BREATH OR WHEEZING 8.5 g 11  . prochlorperazine (COMPAZINE) 10 MG tablet Take 1 tablet (10 mg total) by mouth every 6 (six) hours as needed for nausea or vomiting. 30 tablet 3  . rosuvastatin (CRESTOR) 10 MG tablet Take 1 tablet (10 mg total) by mouth daily. 30 tablet 5  . SUMAtriptan 6 MG/0.5ML SOAJ Inject 6 mg into the skin as needed. 2 cartridge 6  . Tamsulosin HCl (FLOMAX) 0.4 MG CAPS Take 0.4 mg by mouth daily. 1-2 daily    . terconazole  (TERAZOL 7) 0.4 % vaginal cream Place 1 applicator vaginally at bedtime. Use for 7 days. 45 g 0  . TRANSDERM-SCOP, 1.5 MG, 1 MG/3DAYS APPLY 1 PATCH ON SKIN EVERY OTHER DAY ASNEEDED 15 patch 6  . VOLTAREN 1 % GEL APPLY 2 GRAMS TOPICALLY 4 TIMES DAILY 100 g 0  . zolmitriptan (ZOMIG) 5 MG tablet Take 1 tablet (5 mg total) by mouth as needed for migraine. 15 tablet 11   No current facility-administered medications on file prior to visit.    Past Surgical History  Procedure Laterality Date  . Upper gastrointestinal endoscopy  2003, 2007    done by Dr. Velora Heckler  . Tonsillectomy    . Endometrial ablation    . Tympanostomy tube placement      as child-both  . Tympanoplasty Left   . Cholecystectomy    . Knee arthroscopy Right 04/20/12    Allergies  Allergen Reactions  . Diazepam     Triggers migraines  . Divalproex Sodium   . Gabapentin Nausea And Vomiting  . Ondansetron Hcl     Triggers migraines, "makes me vomit"  . Penicillins     REACTION: lips swollen to point of bleeding, sever vaginal irritation  Reports tolerating augmentin well without any complaints or reaction  . Propoxyphene N-Acetaminophen Nausea And Vomiting  . Simvastatin     PATIENT CANNOT BE PRESCRIBED THIS STATIN WHILE RECEIVING NORVIR  . Topamax [Topiramate]     Migraines   . Tramadol Hcl     REACTION: migraines and itching--hx of migraines even without medication   . Zocor [Simvastatin - High Dose]     Social History   Social History  . Marital Status: Divorced    Spouse Name: N/A  . Number of Children: 0  . Years of Education: 12   Occupational History  . disabled    Social History Main Topics  . Smoking status: Never Smoker   . Smokeless tobacco: Never Used  . Alcohol Use: No  . Drug Use: No  . Sexual Activity: Not on file   Other Topics Concern  . Not on file   Social History Narrative      Patient is disabled.   Right handed.   Caffeine - Diet soda., tea   Education - high school     Family History  Problem Relation Age of Onset  . Hypertension Mother   . Stroke Mother   . Heart disease Mother   . Breast cancer Paternal Grandmother   . Kidney cancer Maternal Uncle   . Heart disease Maternal Uncle   . Diabetes Maternal Uncle   . Diabetes Maternal Grandmother   . Heart  disease Maternal Grandmother   . Emphysema Paternal Uncle     never smoker  . Emphysema Paternal Grandfather     never smoker  . Colon cancer Paternal Aunt     with met to lung   . Non-Hodgkin's lymphoma Maternal Uncle   . Heart disease Maternal Grandfather     BP 143/90 mmHg  Pulse 72  Ht 5' 6"  (1.676 m)  Wt 265 lb (120.203 kg)  BMI 42.79 kg/m2  Review of Systems: See HPI above.    Objective:  Physical Exam:  Gen: NAD, comfortable in exam room  Left knee: No gross deformity, ecchymoses, swelling.  Fullness posterior knee. TTP diffusely anteriorly and posterior in popliteal fossa. FROM. Negative ant/post drawers. Negative valgus/varus testing. Negative lachmanns. Negative mcmurrays, apleys, patellar apprehension. NV intact distally.  Right knee: FROM without pain.  MSK u/s left knee: no bakers cyst visible.    Assessment & Plan:  1. Left knee pain - having worsening posterior knee pain with fullness here, pain more than would expect from DJD but doppler u/s negative.  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  We discussed tylenol, aleve, glucosamine, topical medications.  Continue her percocet.  Shown home exercises to do daily.  We could consider MRI if she's not improving as expected.  After informed written consent, patient was seated on exam table. Left knee was prepped with alcohol swab and utilizing anteromedial approach, patient's left knee was injected intraarticularly with 3:1 marcaine: depomedrol. Patient tolerated the procedure well without immediate complications.

## 2015-10-16 ENCOUNTER — Other Ambulatory Visit: Payer: Self-pay | Admitting: Sports Medicine

## 2015-10-20 ENCOUNTER — Ambulatory Visit: Payer: Medicaid Other | Admitting: Sports Medicine

## 2015-10-21 ENCOUNTER — Other Ambulatory Visit: Payer: Self-pay | Admitting: Internal Medicine

## 2015-10-21 DIAGNOSIS — G894 Chronic pain syndrome: Secondary | ICD-10-CM

## 2015-10-21 NOTE — Telephone Encounter (Signed)
Needs refill o n pain medication

## 2015-10-21 NOTE — Telephone Encounter (Signed)
Last filled per pharm 3/20 Last appt w/ dr Redmond Pulling 07/2015, no future appt scheduled Last uds 01/2015

## 2015-10-22 ENCOUNTER — Other Ambulatory Visit: Payer: Self-pay | Admitting: Internal Medicine

## 2015-10-22 DIAGNOSIS — G894 Chronic pain syndrome: Secondary | ICD-10-CM

## 2015-10-22 MED ORDER — OXYCODONE-ACETAMINOPHEN 5-325 MG PO TABS: 1.0000 | ORAL_TABLET | Freq: Four times a day (QID) | ORAL | Status: DC | PRN

## 2015-10-22 NOTE — Telephone Encounter (Signed)
Tried to call, lm for rtc 

## 2015-10-23 ENCOUNTER — Telehealth: Payer: Self-pay

## 2015-10-23 NOTE — Telephone Encounter (Signed)
Pt requesting the nurse to call back. 

## 2015-10-23 NOTE — Telephone Encounter (Signed)
Spoke w/ pt, will make arrangements for scripts

## 2015-10-28 ENCOUNTER — Ambulatory Visit (HOSPITAL_BASED_OUTPATIENT_CLINIC_OR_DEPARTMENT_OTHER)
Admission: RE | Admit: 2015-10-28 | Discharge: 2015-10-28 | Disposition: A | Discharge status: Home / Self Care | Payer: Medicaid Other | Source: Ambulatory Visit | Attending: Family Medicine | Admitting: Family Medicine

## 2015-10-28 ENCOUNTER — Ambulatory Visit (INDEPENDENT_AMBULATORY_CARE_PROVIDER_SITE_OTHER): Payer: Medicaid Other | Admitting: Family Medicine

## 2015-10-28 VITALS — BP 132/82 | HR 77 | Ht 66.0 in | Wt 260.0 lb

## 2015-10-28 DIAGNOSIS — M25562 Pain in left knee: Secondary | ICD-10-CM

## 2015-10-28 NOTE — Patient Instructions (Signed)
Get x-rays downstairs today as you leave. We will call you with the results. We will try to get approval for an MRI of your knee to assess for a lateral meniscus tear. If they say no I will see you back in 4 weeks to reevaluate.

## 2015-10-29 ENCOUNTER — Encounter: Payer: Self-pay | Admitting: Family Medicine

## 2015-10-29 NOTE — Assessment & Plan Note (Signed)
pain seems more localized now on exam to lateral joint line though still having pain radiating posteriorly.  Radiographs done today independently reviewed - mild DJD laterally and patellofemoral compartments.  Given level of pain, not improving with conservative measures will try to get MRI approved though advised patient she may need to complete full 6 weeks before approval.  If so, f/u in 4 weeks.

## 2015-10-29 NOTE — Progress Notes (Addendum)
PCP: Duwaine Maxin, DO  Subjective:   HPI: Patient is a 50 y.o. female here for left knee pain.  4/12: Patient reports she's had 2 weeks of left leg, knee pain. Prior to this had some aching in left thigh that resolved, was achy. Pain now anterior to posterior left knee. Pain level is 6/10, sharp. Associated swelling, tightness. Knee pops, locks, catches. Difficulty putting a lot of pressure on this. Takes percocet for chronic pain. Has been icing, using heat, and elevating. Feels like swelling occurs posterior left knee. No skin changes, numbness.  4/26: Patient reports she's still having a lot of pain in left knee. Pain is anterior to posterior, 8/10, sharp. Knee is popping, catching, locking. Minimal benefit with intraarticular injection. Swells with a lot of walking. Using, ice and heat. Doing home exercises.  No skin changes, numbness.  Past Medical History  Diagnosis Date  . Anemia 10/2009    macrocytic anemia with baseline MCV 104-106  . PUD (peptic ulcer disease) 07/1997    per EGD report 07/1997 with history of esophagitis  . Migraines     on topamax and triptans, frequent (almost daily) attacks   . Hyperlipidemia   . HIV infection ([place])     CD4 = 570 (05/2010), VL undetectation  . Anxiety   . Spondylolisthesis of lumbar region     L5-S1  . Gastroparesis   . Shingles     in lumbar dermatome  . Hypertension     no meds  . 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 Beryle Beams is her oncologist.   . Asthma   . DVT (deep venous thrombosis) ([facility])     history clot lt groin when she had lymphoma-in remision now.  . Traumatic tear of lateral meniscus of right knee 04/20/2012  . 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 Hiatal hernia 01/01/2002  . Acute gastritis without mention of hemorrhage 01/01/2002  . Duodenitis without mention of hemorrhage 01/01/2002  . GERD (gastroesophageal reflux disease)   . IBS  (irritable bowel syndrome)     Current Outpatient Prescriptions on File Prior to Visit  Medication Sig Dispense Refill  . Beclomethasone Dipropionate 80 MCG/ACT AERS Place 2 Inhalers into the nose daily. 8.7 g 10  . botulinum toxin Type A (BOTOX) 100 UNITS SOLR injection INJECT 200 UNITS IM INTO  HEAD NECK EVERY 3 MONTHS    . Calcium Carbonate-Vitamin D (CALCIUM-VITAMIN D) 500-200 MG-UNIT per tablet Take 2 tablets by mouth daily.     . DULERA 100-5 MCG/ACT AERO INHALE 2 PUFFS FIRST THING IN THE MORNING AND THEN ANOTHER 2 PUFFS ABOUT 12 HOURS LATER 13 g 11  . emtricitabine-tenofovir AF (DESCOVY) 200-25 MG per tablet Take 1 tablet by mouth daily. 30 tablet 11  . esomeprazole (NEXIUM) 40 MG capsule Take 1 capsule (40 mg total) by mouth 2 (two) times daily before a meal. 60 capsule 11  . fluconazole (DIFLUCAN) 100 MG tablet Take 1 tablet (100 mg total) by mouth daily. 14 tablet 4  . FLUoxetine HCl 60 MG TABS Take 60 mg by mouth daily. 30 tablet 3  . hydrocortisone cream 1 % APPLY TO AFFECTED AREA TWICE A DAY 28 g 2  . LORazepam (ATIVAN) 0.5 MG tablet Take 1 tablet (0.5 mg total) by mouth daily as needed for anxiety. 30 tablet 0  . LORazepam (ATIVAN) 0.5 MG tablet Take 1 tablet (0.5 mg total) by mouth daily as needed for anxiety. 30 tablet  0  . naloxone (NARCAN) 0.4 MG/ML injection Inject 1 mL (0.4 mg total) into the muscle as needed. 1 mL 3  . neomycin-bacitracin-polymyxin (NEOSPORIN) [phone] ointment Apply topically 4 (four) times daily. 28.3 g 0  . oxyCODONE-acetaminophen (PERCOCET/ROXICET) 5-325 MG tablet Take 1 tablet by mouth every 6 (six) hours as needed for severe pain. 120 tablet 0  . PREZCOBIX 800-150 MG per tablet TAKE 1 TABLET BY MOUTH EVERY DAY 30 tablet 5  . PROAIR HFA 108 (90 Base) MCG/ACT inhaler USE 1 TO 2 PUFFS EVERY 4 TO 6 HOURS AS NEEDED SHORTNESS OF BREATH OR WHEEZING 8.5 g 11  . prochlorperazine (COMPAZINE) 10 MG tablet Take 1 tablet (10 mg total) by mouth every 6 (six) hours  as needed for nausea or vomiting. 30 tablet 3  . rosuvastatin (CRESTOR) 10 MG tablet Take 1 tablet (10 mg total) by mouth daily. 30 tablet 5  . SUMAtriptan 6 MG/0.5ML SOAJ Inject 6 mg into the skin as needed. 2 cartridge 6  . Tamsulosin HCl (FLOMAX) 0.4 MG CAPS Take 0.4 mg by mouth daily. 1-2 daily    . terconazole (TERAZOL 7) 0.4 % vaginal cream Place 1 applicator vaginally at bedtime. Use for 7 days. 45 g 0  . TRANSDERM-SCOP, 1.5 MG, 1 MG/3DAYS APPLY 1 PATCH ON SKIN EVERY OTHER DAY ASNEEDED 15 patch 6  . VOLTAREN 1 % GEL APPLY 2 GRAMS TOPICALLY 4 TIMES DAILY 100 g 1  . zolmitriptan (ZOMIG) 5 MG tablet Take 1 tablet (5 mg total) by mouth as needed for migraine. 15 tablet 11   No current facility-administered medications on file prior to visit.    Past Surgical History  Procedure Laterality Date  . Upper gastrointestinal endoscopy  2003, 2007    done by Dr. Velora Heckler  . Tonsillectomy    . Endometrial ablation    . Tympanostomy tube placement      as child-both  . Tympanoplasty Left   . Cholecystectomy    . Knee arthroscopy Right 04/20/12    Allergies  Allergen Reactions  . Diazepam     Triggers migraines  . Divalproex Sodium   . Gabapentin Nausea And Vomiting  . Ondansetron Hcl     Triggers migraines, "makes me vomit"  . Penicillins     REACTION: lips swollen to point of bleeding, sever vaginal irritation  Reports tolerating augmentin well without any complaints or reaction  . Propoxyphene N-Acetaminophen Nausea And Vomiting  . Simvastatin     PATIENT CANNOT BE PRESCRIBED THIS STATIN WHILE RECEIVING NORVIR  . Topamax [Topiramate]     Migraines   . Tramadol Hcl     REACTION: migraines and itching--hx of migraines even without medication   . Zocor [Simvastatin - High Dose]     Social History   Social History  . Marital Status: Divorced    Spouse Name: N/A  . Number of Children: 0  . Years of Education: 12   Occupational History  . disabled    Social History Main  Topics  . Smoking status: Never Smoker   . Smokeless tobacco: Never Used  . Alcohol Use: No  . Drug Use: No  . Sexual Activity: Not on file   Other Topics Concern  . Not on file   Social History Narrative      Patient is disabled.   Right handed.   Caffeine - Diet soda., tea   Education - high school    Family History  Problem Relation Age of Onset  .  Hypertension Mother   . Stroke Mother   . Heart disease Mother   . Breast cancer Paternal Grandmother   . Kidney cancer Maternal Uncle   . Heart disease Maternal Uncle   . Diabetes Maternal Uncle   . Diabetes Maternal Grandmother   . Heart disease Maternal Grandmother   . Emphysema Paternal Uncle     never smoker  . Emphysema Paternal Grandfather     never smoker  . Colon cancer Paternal Aunt     with met to lung   . Non-Hodgkin's lymphoma Maternal Uncle   . Heart disease Maternal Grandfather     BP 132/82 mmHg  Pulse 77  Ht 5' 6"  (1.676 m)  Wt 260 lb (117.935 kg)  BMI 41.99 kg/m2  Review of Systems: See HPI above.    Objective:  Physical Exam:  Gen: NAD, comfortable in exam room  Left knee: No gross deformity, ecchymoses, swelling.  TTP lateral joint line, popliteal fossa. FROM. Negative ant/post drawers. Negative valgus/varus testing. Negative lachmanns. Painful mcmurrays, apleys.  Negative patellar apprehension. NV intact distally.  Right knee: FROM without pain.    Assessment & Plan:  1. Left knee pain - pain seems more localized now on exam to lateral joint line though still having pain radiating posteriorly.  Radiographs done today independently reviewed - mild DJD laterally and patellofemoral compartments.  Given level of pain, not improving with conservative measures will try to get MRI approved though advised patient she may need to complete full 6 weeks before approval.  If so, f/u in 4 weeks.  Addendum:  MRI reviewed and discussed with patient.  She does have a lateral meniscus tear with a  flap component (also with medial meniscus tear but the lateral one is symptomatic on exam and history).  We will go ahead with ortho referral - she has seen Dr. Mardelle Matte before so will refer to him.

## 2015-10-30 NOTE — Addendum Note (Signed)
Addended by: Sherrie George F on: 10/30/2015 12:52 PM   Modules accepted: Orders

## 2015-11-02 ENCOUNTER — Ambulatory Visit (INDEPENDENT_AMBULATORY_CARE_PROVIDER_SITE_OTHER): Payer: Medicaid Other

## 2015-11-02 ENCOUNTER — Telehealth: Payer: Self-pay | Admitting: Family Medicine

## 2015-11-02 DIAGNOSIS — S83282A Other tear of lateral meniscus, current injury, left knee, initial encounter: Secondary | ICD-10-CM | POA: Diagnosis not present

## 2015-11-02 DIAGNOSIS — S83242A Other tear of medial meniscus, current injury, left knee, initial encounter: Secondary | ICD-10-CM | POA: Diagnosis not present

## 2015-11-02 DIAGNOSIS — X58XXXA Exposure to other specified factors, initial encounter: Secondary | ICD-10-CM

## 2015-11-02 DIAGNOSIS — M25462 Effusion, left knee: Secondary | ICD-10-CM

## 2015-11-02 DIAGNOSIS — M94262 Chondromalacia, left knee: Secondary | ICD-10-CM | POA: Diagnosis not present

## 2015-11-02 DIAGNOSIS — M1712 Unilateral primary osteoarthritis, left knee: Secondary | ICD-10-CM

## 2015-11-02 DIAGNOSIS — M25562 Pain in left knee: Secondary | ICD-10-CM

## 2015-11-02 DIAGNOSIS — M7122 Synovial cyst of popliteal space [Baker], left knee: Secondary | ICD-10-CM

## 2015-11-02 NOTE — Telephone Encounter (Signed)
Spoke to patient and gave her information about the MRI machine.

## 2015-11-03 NOTE — Addendum Note (Signed)
Addended by: Dene Gentry on: 11/03/2015 02:52 PM   Modules accepted: Miquel Dunn

## 2015-11-10 ENCOUNTER — Other Ambulatory Visit: Payer: Self-pay | Admitting: *Deleted

## 2015-11-10 DIAGNOSIS — F419 Anxiety disorder, unspecified: Secondary | ICD-10-CM

## 2015-11-11 ENCOUNTER — Other Ambulatory Visit: Payer: Self-pay

## 2015-11-13 MED ORDER — LORAZEPAM 0.5 MG PO TABS: 0.5000 mg | ORAL_TABLET | Freq: Every day | ORAL | Status: DC | PRN

## 2015-11-13 NOTE — Telephone Encounter (Signed)
Called to pharm, printed [place] database it's in dr wilson's box

## 2015-11-16 NOTE — Telephone Encounter (Signed)
LVM for patient to call back Spoke with [facility]- patient last picked up #[phone] 07/28/15 under last rx written in clinic

## 2015-11-23 ENCOUNTER — Other Ambulatory Visit: Payer: Self-pay | Admitting: Internal Medicine

## 2015-11-23 ENCOUNTER — Telehealth: Payer: Self-pay | Admitting: Internal Medicine

## 2015-11-23 ENCOUNTER — Telehealth: Payer: Self-pay | Admitting: Family Medicine

## 2015-11-23 DIAGNOSIS — S83282A Other tear of lateral meniscus, current injury, left knee, initial encounter: Secondary | ICD-10-CM

## 2015-11-23 NOTE — Telephone Encounter (Addendum)
Rec' phone call from Dr. Ericka Pontiff office Sherrie George CMA) requesting an Ortho Surgeon Merilyn Baba for left knee -Lateral Meniscus Tear with a flap component to Dr. Luanna Cole office

## 2015-11-23 NOTE — Telephone Encounter (Signed)
Referral to ortho placed.

## 2015-11-23 NOTE — Telephone Encounter (Signed)
Spoke to referral coordinator at [facility] and she stated she would contact patient about setting up appointment with orthopedic surgeon.

## 2015-11-25 ENCOUNTER — Ambulatory Visit: Payer: Medicaid Other | Admitting: Family Medicine

## 2015-12-07 ENCOUNTER — Other Ambulatory Visit: Payer: Self-pay | Admitting: Infectious Disease

## 2015-12-07 ENCOUNTER — Other Ambulatory Visit: Payer: Self-pay | Admitting: Sports Medicine

## 2015-12-07 DIAGNOSIS — B20 Human immunodeficiency virus [HIV] disease: Secondary | ICD-10-CM

## 2015-12-15 ENCOUNTER — Other Ambulatory Visit: Payer: Self-pay

## 2015-12-15 DIAGNOSIS — F419 Anxiety disorder, unspecified: Secondary | ICD-10-CM

## 2015-12-16 ENCOUNTER — Ambulatory Visit (INDEPENDENT_AMBULATORY_CARE_PROVIDER_SITE_OTHER): Payer: Medicaid Other | Admitting: Internal Medicine

## 2015-12-16 ENCOUNTER — Encounter: Payer: Self-pay | Admitting: Internal Medicine

## 2015-12-16 ENCOUNTER — Encounter: Payer: Self-pay | Admitting: *Deleted

## 2015-12-16 VITALS — BP 112/75 | HR 91 | Temp 97.6°F | Resp 18

## 2015-12-16 DIAGNOSIS — Z21 Asymptomatic human immunodeficiency virus [HIV] infection status: Secondary | ICD-10-CM

## 2015-12-16 DIAGNOSIS — E785 Hyperlipidemia, unspecified: Secondary | ICD-10-CM

## 2015-12-16 DIAGNOSIS — G43719 Chronic migraine without aura, intractable, without status migrainosus: Secondary | ICD-10-CM

## 2015-12-16 DIAGNOSIS — G894 Chronic pain syndrome: Secondary | ICD-10-CM | POA: Diagnosis present

## 2015-12-16 DIAGNOSIS — F419 Anxiety disorder, unspecified: Secondary | ICD-10-CM | POA: Diagnosis not present

## 2015-12-16 DIAGNOSIS — B20 Human immunodeficiency virus [HIV] disease: Secondary | ICD-10-CM

## 2015-12-16 MED ORDER — OXYCODONE-ACETAMINOPHEN 5-325 MG PO TABS: 1.0000 | ORAL_TABLET | Freq: Four times a day (QID) | ORAL | Status: DC | PRN

## 2015-12-16 MED ORDER — LORAZEPAM 0.5 MG PO TABS: 0.5000 mg | ORAL_TABLET | Freq: Every day | ORAL | Status: DC | PRN

## 2015-12-16 NOTE — Patient Instructions (Signed)
1. I hope your knee surgery goes well.  Please return to clinic for follow-up late August or early September.   2. Please take all medications as prescribed.    3. If you have worsening of your symptoms or new symptoms arise, please call the clinic [phone]), or go to the ER immediately if symptoms are severe.

## 2015-12-16 NOTE — Progress Notes (Signed)
Subjective:    Patient ID: [name], female    DOB: 04/09/1966, 50 y.o.   MRN: [id number]  HPI Comments: [name] is a 50 year old woman with PMH as below here for follow-up of chronic pain.  Please see problem based charting for the status of this and other chronic conditions.     Past Medical History  Diagnosis Date  . Anemia 10/2009    macrocytic anemia with baseline MCV 104-106  . PUD (peptic ulcer disease) 07/1997    per EGD report 07/1997 with history of esophagitis  . Migraines     on topamax and triptans, frequent (almost daily) attacks   . Hyperlipidemia   . HIV infection ([place])     CD4 = 570 (05/2010), VL undetectation  . Anxiety   . Spondylolisthesis of lumbar region     L5-S1  . Gastroparesis   . Shingles     in lumbar dermatome  . Hypertension     no meds  . Non Hodgkin's lymphoma ([place])     stage II, s/p resection, chemotherapy, radiotherapy, Dr. Beryle Beams is her oncologist.   . Asthma   . DVT (deep venous thrombosis) (HCC)     history clot lt groin when she had lymphoma-in remision now.  . Traumatic tear of lateral meniscus of right knee 04/20/2012  . 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 Hiatal hernia 01/01/2002  . Acute gastritis without mention of hemorrhage 01/01/2002  . Duodenitis without mention of hemorrhage 01/01/2002  . GERD (gastroesophageal reflux disease)   . IBS (irritable bowel syndrome)    Current Outpatient Prescriptions on File Prior to Visit  Medication Sig Dispense Refill  . Beclomethasone Dipropionate 80 MCG/ACT AERS Place 2 Inhalers into the nose daily. 8.7 g 10  . botulinum toxin Type A (BOTOX) 100 UNITS SOLR injection INJECT 200 UNITS IM INTO  HEAD NECK EVERY 3 MONTHS    . Calcium Carbonate-Vitamin D (CALCIUM-VITAMIN D) 500-200 MG-UNIT per tablet Take 2 tablets by mouth daily.     . DULERA 100-5 MCG/ACT AERO INHALE 2 PUFFS FIRST THING IN THE MORNING AND THEN ANOTHER 2 PUFFS ABOUT 12 HOURS LATER 13 g 11  . emtricitabine-tenofovir  AF (DESCOVY) 200-25 MG per tablet Take 1 tablet by mouth daily. 30 tablet 11  . esomeprazole (NEXIUM) 40 MG capsule Take 1 capsule (40 mg total) by mouth 2 (two) times daily before a meal. 60 capsule 11  . fluconazole (DIFLUCAN) 100 MG tablet Take 1 tablet (100 mg total) by mouth daily. 14 tablet 4  . FLUoxetine HCl 60 MG TABS Take 60 mg by mouth daily. 30 tablet 3  . hydrocortisone cream 1 % APPLY TO AFFECTED AREA TWICE A DAY 28 g 2  . LORazepam (ATIVAN) 0.5 MG tablet Take 1 tablet (0.5 mg total) by mouth daily as needed for anxiety. 30 tablet 0  . naloxone (NARCAN) 0.4 MG/ML injection Inject 1 mL (0.4 mg total) into the muscle as needed. 1 mL 3  . neomycin-bacitracin-polymyxin (NEOSPORIN) [phone] ointment Apply topically 4 (four) times daily. 28.3 g 0  . oxyCODONE-acetaminophen (PERCOCET/ROXICET) 5-325 MG tablet Take 1 tablet by mouth every 6 (six) hours as needed for severe pain. 120 tablet 0  . PREZCOBIX 800-150 MG tablet TAKE 1 TABLET BY MOUTH EVERY DAY 30 tablet 5  . PROAIR HFA 108 (90 Base) MCG/ACT inhaler USE 1 TO 2 PUFFS EVERY 4 TO 6 HOURS AS NEEDED SHORTNESS OF BREATH OR WHEEZING 8.5 g 11  .  prochlorperazine (COMPAZINE) 10 MG tablet Take 1 tablet (10 mg total) by mouth every 6 (six) hours as needed for nausea or vomiting. 30 tablet 3  . rosuvastatin (CRESTOR) 10 MG tablet Take 1 tablet (10 mg total) by mouth daily. 30 tablet 5  . SUMAtriptan 6 MG/0.5ML SOAJ Inject 6 mg into the skin as needed. 2 cartridge 6  . Tamsulosin HCl (FLOMAX) 0.4 MG CAPS Take 0.4 mg by mouth daily. 1-2 daily    . terconazole (TERAZOL 7) 0.4 % vaginal cream Place 1 applicator vaginally at bedtime. Use for 7 days. 45 g 0  . TRANSDERM-SCOP, 1.5 MG, 1 MG/3DAYS APPLY 1 PATCH ON SKIN EVERY OTHER DAY ASNEEDED 15 patch 6  . VOLTAREN 1 % GEL APPLY 2 GRAMS TOPICALLY 4 TIMES DAILY 100 g 1  . zolmitriptan (ZOMIG) 5 MG tablet Take 1 tablet (5 mg total) by mouth as needed for migraine. 15 tablet 11   No current  facility-administered medications on file prior to visit.    Review of Systems  Constitutional: Negative for fever, chills and appetite change.  Gastrointestinal: Positive for vomiting and diarrhea. Negative for nausea and constipation.       Vomiting and diarrhea (both her and boyfriend) over the weekend (ate similar meal), since resolved.  Neurological: Negative for syncope and light-headedness.       Filed Vitals:   12/16/15 1331 12/16/15 1423  BP: 84/63 112/75  Pulse: 95 91  Temp: 97.6 F (36.4 C)   TempSrc: Oral   Resp: 18   SpO2: 98%      Objective:   Physical Exam  Constitutional: She is oriented to person, place, and time. She appears well-developed. No distress.  HENT:  Head: Normocephalic and atraumatic.  Mouth/Throat: Oropharynx is clear and moist. No oropharyngeal exudate.  Eyes: Conjunctivae and EOM are normal. Pupils are equal, round, and reactive to light. No scleral icterus.  Neck: Neck supple.  Cardiovascular: Normal rate, regular rhythm and normal heart sounds.  Exam reveals no gallop and no friction rub.   No murmur heard. Pulmonary/Chest: Effort normal. No respiratory distress. She has no wheezes. She has no rales.  Abdominal: Soft. Bowel sounds are normal. She exhibits no distension. There is no tenderness. There is no rebound and no guarding.  Musculoskeletal: Normal range of motion. She exhibits no edema or tenderness.  Neurological: She is alert and oriented to person, place, and time. No cranial nerve deficit.  Skin: Skin is warm and dry. She is not diaphoretic.  Psychiatric: She has a normal mood and affect. Her behavior is normal.  Vitals reviewed.         Assessment & Plan:  Please see problem based charting for A&P.

## 2015-12-18 NOTE — Assessment & Plan Note (Addendum)
Assessment:  Stable mood. Compliant with Prozac.  Still on Ativan.   database checked, last rx 11/13/15, no red flags.   Plan:  Three paper rx provided for Ativan 0.5mg  prn daily #30, for fill now, after 01/15/16 and after 02/15/16, respectively.   This is low dose but would continue to try to wean off in the future, especially with concurrent use of opioid.  She left before UDS.  She will follow-up in 3 months.  Need visit and UDS before providing refills.

## 2015-12-18 NOTE — Assessment & Plan Note (Signed)
Assessment:  She feels the last Botox injection didn't last as long.  Due for next injection next week. Plan:  Continue prn triptan.  Follow-up with [facility] for Botox injection on 06/22.

## 2015-12-18 NOTE — Assessment & Plan Note (Signed)
Assessment:  Controlled.  Compliant with Descovy and Prezcobix.  Followed at [facility]. Plan:  Continue current HAART and management at [facility].

## 2015-12-18 NOTE — Assessment & Plan Note (Addendum)
Assessment:  She stopped her Crestor due to myalgias.  Unclear how long ago. Plan:  Will check with [facility] pharmacist regarding alternative statin that will not interfere with HAART.

## 2015-12-18 NOTE — Assessment & Plan Note (Addendum)
Assessment:  Chronic pain stable.  Recent MRI revealed left knee OA and meniscal tears.  She reports that Orthopedics plans knee surgery at the end of this month or early next month.  She continues to require Percocet 5/325mg  q6h.  I sent rx for Narcan a few months ago but she did not fill because she said she did not want people to think that she did not know how to take her meds.  She is agreeable to filling once I explained that it is a safety measure because people can accidentally overdose on narcotic.  [place] database checked today and no red flags.  Last UDS 01/2015.  Last Pecocet rx was on 11/21/15 and she says she last took it about 5 hours ago.  She was provided with 3 paper rx for Percocet 5/325mg  take 1 tab q6h prn #120 no refill to fill on or after 12/22/15, 01/21/16 and 02/21/16, respectively.  At the end of the visit, I asked her to provide UDS (since it has been nearly 1 year since last UDS) and she was resistant, stating she didn't think she would be able to urinate.  I advised that we would give her water and asked that she try to give a sample.  I ended the visit and RN provided her with water.  When RN left to get PharmD to speak with patient about Narcan administration, patient left the office.  Plan:  Refills were provided as above.  Follow-up in 3 months. Need UDS next visit BEFORE giving refills.  Need to work on tapering, though she has been resistant in the past and I think this will be difficult now as she is about to have surgery.  I have asked her to follow-up in 3 months.  By that time, I hope she has recovered from surgery and pain better controlled.  Can try to taper then.  I have advised her to pick up Narcan rx which is at her pharmacy and she seemed agreeable, however she left before our PharmD could come and talk more with her and provide instruction.

## 2015-12-19 NOTE — Progress Notes (Signed)
Internal Medicine Clinic Attending  Case discussed with Dr. Wilson soon after the resident saw the patient.  We reviewed the resident's history and exam and pertinent patient test results.  I agree with the assessment, diagnosis, and plan of care documented in the resident's note.  

## 2015-12-22 ENCOUNTER — Other Ambulatory Visit: Payer: Self-pay | Admitting: Neurology

## 2015-12-24 ENCOUNTER — Encounter: Payer: Self-pay | Admitting: Neurology

## 2015-12-24 ENCOUNTER — Other Ambulatory Visit: Payer: Self-pay | Admitting: *Deleted

## 2015-12-24 ENCOUNTER — Ambulatory Visit: Payer: Medicaid Other | Admitting: Neurology

## 2015-12-24 ENCOUNTER — Ambulatory Visit (INDEPENDENT_AMBULATORY_CARE_PROVIDER_SITE_OTHER): Payer: Medicaid Other | Admitting: Neurology

## 2015-12-24 VITALS — BP 133/86 | HR 88 | Ht 66.0 in | Wt 290.0 lb

## 2015-12-24 DIAGNOSIS — G2581 Restless legs syndrome: Secondary | ICD-10-CM

## 2015-12-24 DIAGNOSIS — G43719 Chronic migraine without aura, intractable, without status migrainosus: Secondary | ICD-10-CM | POA: Diagnosis not present

## 2015-12-24 HISTORY — DX: Restless legs syndrome: G25.81

## 2015-12-24 MED ORDER — PROCHLORPERAZINE MALEATE 10 MG PO TABS: 10.0000 mg | ORAL_TABLET | Freq: Four times a day (QID) | ORAL | Status: DC | PRN

## 2015-12-24 MED ORDER — PRAMIPEXOLE DIHYDROCHLORIDE 0.25 MG PO TABS: 0.5000 mg | ORAL_TABLET | Freq: Every day | ORAL | Status: DC

## 2015-12-24 MED ORDER — ZOLMITRIPTAN 5 MG PO TABS
5.0000 mg | ORAL_TABLET | ORAL | Status: DC | PRN
Start: 2015-12-24 — End: 2015-12-24

## 2015-12-24 MED ORDER — SCOPOLAMINE 1 MG/3DAYS TD PT72: 1.0000 | MEDICATED_PATCH | TRANSDERMAL | Status: DC

## 2015-12-24 MED ORDER — SUMATRIPTAN SUCCINATE 6 MG/0.5ML ~~LOC~~ SOAJ: 6.0000 mg | SUBCUTANEOUS | Status: DC | PRN

## 2015-12-24 MED ORDER — ZOLMITRIPTAN 5 MG PO TABS: 5.0000 mg | ORAL_TABLET | ORAL | Status: DC | PRN

## 2015-12-24 MED ORDER — SUMATRIPTAN SUCCINATE 6 MG/0.5ML ~~LOC~~ SOAJ
6.0000 mg | SUBCUTANEOUS | Status: DC | PRN
Start: 2015-12-24 — End: 2016-02-24

## 2015-12-24 NOTE — Progress Notes (Signed)
PATIENT: [name] DOB: 12/24/1965  Chief Complaint  Patient presents with  . Migraine    Botox 200 units - specialty pharmacy     HISTORICAL  [name], Is accompanied by her sister returned for Botox injection as migraine prevention  She has past medical history of HIV, hyperlipidemia, migraines, depression, and non-Hodgkin lymphoma in 2005 with chemotherapy and radiation.   She has history of migraines since 19. In the last 10 years, she reports having daily migraines. She is currently taking Topamax and Zomig with relief. She takes approximately 20-25 tablets of her Zomig per month.    She has aura's that look like floating dirt particles, flashing lights, and she sees Christmas tree lights when they are severe, she has nausea, vomiting and sometimes diarrhea when they're severe. She also complains of diplopia, blurry vision, and  photophobia. She also has had a lightning bolt sensation throughout her body.  She describes them as pulsating and throbbing  on a scale of 5-6/10 which she considers mild, when they are severe they can go as high as 10/10. They usually start in the right temporal area, but will also start on the left temporal and are worse.   Any type of smoke or MSG can precipitate a migraine. They generally last 2-9 days. She has been to the emergency department twice in the last year for migraines. She states "I just can't do this anymore I need to do something about her migraines"  She has tried and failed different preventive medications:  atenolol, verapamil, nortriptyline, Inderal, Depakote without benefit. Atenolol and verapamil caused hypotension. Depakote caused severe vomiting. Topamax causing her worsening headaches, will show tried Zonegran, Topamax ER, she denies significant improvement.  For abortive treatment, she has tried Imitrex, Maxalt with suboptimal response, Zomig as needed since to work best,  she also takes transderm scophalamine patch for  nause  I started to give her BOTOX injection since May 2013, every 3 months, she responded very well,   Previous MRI brain was normal in 2013.  Last Botox injection as migraine prevention was March 2017, she responded very well, but in recent few weeks, she noticed increased migraine headaches, this baby had headache left retro-orbital area severe pounding headache with associated light noise sensitivity, has been ongoing for 3 days, failed to respond to Zomig by mouth, Imitrex injection as needed, she has constant dry heaves, difficulty opening her eyes, In addition, today she complains of restless leg symptoms, difficult to stand or lying still for extended period of time, has tried Neurontin-cause nausea, confusion,  Lyrica-cause excessive weight gain, she has never tried dopamine agonist in the past  I reviewed laboratory in April 2017, normal hemoglobin 12 point 9, mild elevated RDW 15.8, decreased MCV   REVIEW OF SYSTEMS: Full 14 system review of systems performed and notable only for as above  ALLERGIES: Allergies  Allergen Reactions  . Diazepam     Triggers migraines  . Divalproex Sodium   . Gabapentin Nausea And Vomiting  . Ondansetron Hcl     Triggers migraines, "makes me vomit"  . Penicillins     REACTION: lips swollen to point of bleeding, sever vaginal irritation  Reports tolerating augmentin well without any complaints or reaction  . Propoxyphene N-Acetaminophen Nausea And Vomiting  . Simvastatin     PATIENT CANNOT BE PRESCRIBED THIS STATIN WHILE RECEIVING NORVIR  . Topamax [Topiramate]     Migraines   . Tramadol Hcl  REACTION: migraines and itching--hx of migraines even without medication   . Zocor [Simvastatin - High Dose]     HOME MEDICATIONS: Current Outpatient Prescriptions  Medication Sig Dispense Refill  . Beclomethasone Dipropionate 80 MCG/ACT AERS Place 2 Inhalers into the nose daily. 8.7 g 10  . botulinum toxin Type A (BOTOX) 100 UNITS SOLR  injection INJECT 200 UNITS IM INTO  HEAD NECK EVERY 3 MONTHS    . Calcium Carbonate-Vitamin D (CALCIUM-VITAMIN D) 500-200 MG-UNIT per tablet Take 2 tablets by mouth daily.     . DULERA 100-5 MCG/ACT AERO INHALE 2 PUFFS FIRST THING IN THE MORNING AND THEN ANOTHER 2 PUFFS ABOUT 12 HOURS LATER 13 g 11  . emtricitabine-tenofovir AF (DESCOVY) 200-25 MG per tablet Take 1 tablet by mouth daily. 30 tablet 11  . esomeprazole (NEXIUM) 40 MG capsule Take 1 capsule (40 mg total) by mouth 2 (two) times daily before a meal. 60 capsule 11  . fluconazole (DIFLUCAN) 100 MG tablet Take 1 tablet (100 mg total) by mouth daily. 14 tablet 4  . hydrocortisone cream 1 % APPLY TO AFFECTED AREA TWICE A DAY 28 g 2  . LORazepam (ATIVAN) 0.5 MG tablet Take 1 tablet (0.5 mg total) by mouth daily as needed for anxiety. 30 tablet 0  . LORazepam (ATIVAN) 0.5 MG tablet Take 1 tablet (0.5 mg total) by mouth daily as needed for anxiety. 30 tablet 0  . oxyCODONE-acetaminophen (PERCOCET/ROXICET) 5-325 MG tablet Take 1 tablet by mouth every 6 (six) hours as needed for severe pain. 120 tablet 0  . oxyCODONE-acetaminophen (PERCOCET/ROXICET) 5-325 MG tablet Take 1 tablet by mouth every 6 (six) hours as needed for severe pain. 120 tablet 0  . PREZCOBIX 800-150 MG tablet TAKE 1 TABLET BY MOUTH EVERY DAY 30 tablet 5  . PROAIR HFA 108 (90 Base) MCG/ACT inhaler USE 1 TO 2 PUFFS EVERY 4 TO 6 HOURS AS NEEDED SHORTNESS OF BREATH OR WHEEZING 8.5 g 11  . Tamsulosin HCl (FLOMAX) 0.4 MG CAPS Take 0.4 mg by mouth daily. 1-2 daily    . TRANSDERM-SCOP, 1.5 MG, 1 MG/3DAYS APPLY 1 PATCH ON SKIN EVERY OTHER DAY ASNEEDED 15 patch 6  . VOLTAREN 1 % GEL APPLY 2 GRAMS TOPICALLY 4 TIMES DAILY 100 g 1  . pramipexole (MIRAPEX) 0.25 MG tablet Take 2 tablets (0.5 mg total) by mouth at bedtime. 60 tablet 11  . prochlorperazine (COMPAZINE) 10 MG tablet Take 1 tablet (10 mg total) by mouth every 6 (six) hours as needed for nausea or vomiting. 30 tablet 11  .  scopolamine (TRANSDERM-SCOP, 1.5 MG,) 1 MG/3DAYS Place 1 patch (1.5 mg total) onto the skin every 3 (three) days. 10 patch 11  . SUMAtriptan 6 MG/0.5ML SOAJ Inject 6 mg into the skin as needed. 10 Syringe 11  . zolmitriptan (ZOMIG) 5 MG tablet Take 1 tablet (5 mg total) by mouth as needed for migraine. 15 tablet 11   No current facility-administered medications for this visit.    PAST MEDICAL HISTORY: Past Medical History  Diagnosis Date  . Anemia 10/2009    macrocytic anemia with baseline MCV 104-106  . PUD (peptic ulcer disease) 07/1997    per EGD report 07/1997 with history of esophagitis  . Migraines     on topamax and triptans, frequent (almost daily) attacks   . Hyperlipidemia   . HIV infection ([place])     CD4 = 570 (05/2010), VL undetectation  . Anxiety   . Spondylolisthesis of lumbar region  L5-S1  . Gastroparesis   . Shingles     in lumbar dermatome  . Hypertension     no meds  . Non Hodgkin's lymphoma ([place])     stage II, s/p resection, chemotherapy, radiotherapy, Dr. Beryle Beams is her oncologist.   . Asthma   . DVT (deep venous thrombosis) (HCC)     history clot lt groin when she had lymphoma-in remision now.  . Traumatic tear of lateral meniscus of right knee 04/20/2012  . 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 Hiatal hernia 01/01/2002  . Acute gastritis without mention of hemorrhage 01/01/2002  . Duodenitis without mention of hemorrhage 01/01/2002  . GERD (gastroesophageal reflux disease)   . IBS (irritable bowel syndrome)   . Restless leg syndrome 12/24/2015    PAST SURGICAL HISTORY: Past Surgical History  Procedure Laterality Date  . Upper gastrointestinal endoscopy  2003, 2007    done by Dr. Velora Heckler  . Tonsillectomy    . Endometrial ablation    . Tympanostomy tube placement      as child-both  . Tympanoplasty Left   . Cholecystectomy    . Knee arthroscopy Right 04/20/12    FAMILY HISTORY: Family History  Problem Relation Age of Onset  .  Hypertension Mother   . Stroke Mother   . Heart disease Mother   . Breast cancer Paternal Grandmother   . Kidney cancer Maternal Uncle   . Heart disease Maternal Uncle   . Diabetes Maternal Uncle   . Diabetes Maternal Grandmother   . Heart disease Maternal Grandmother   . Emphysema Paternal Uncle     never smoker  . Emphysema Paternal Grandfather     never smoker  . Colon cancer Paternal Aunt     with met to lung   . Non-Hodgkin's lymphoma Maternal Uncle   . Heart disease Maternal Grandfather     SOCIAL HISTORY:  Social History   Social History  . Marital Status: Divorced    Spouse Name: N/A  . Number of Children: 0  . Years of Education: 12   Occupational History  . disabled    Social History Main Topics  . Smoking status: Never Smoker   . Smokeless tobacco: Never Used  . Alcohol Use: No  . Drug Use: No  . Sexual Activity: Not on file   Other Topics Concern  . Not on file   Social History Narrative      Patient is disabled.   Right handed.   Caffeine - Diet soda., tea   Education - high school     PHYSICAL EXAM   Filed Vitals:   12/24/15 1025  BP: 133/86  Pulse: 88  Height: 5' 6"  (1.676 m)  Weight: 290 lb (131.543 kg)    Not recorded      Body mass index is 46.83 kg/(m^2).  PHYSICAL EXAMNIATION:  Gen: NAD, conversant, well nourised, obese, well groomed                     Cardiovascular: Regular rate rhythm, no peripheral edema, warm, nontender. Eyes: Conjunctivae clear without exudates or hemorrhage Neck: Supple, no carotid bruise. Pulmonary: Clear to auscultation bilaterally   NEUROLOGICAL EXAM:  MENTAL STATUS: Speech:    Speech is normal; fluent and spontaneous with normal comprehension.  Cognition:     Orientation to time, place and person     Normal recent and remote memory     Normal Attention span and concentration     Normal Language,  naming, repeating,spontaneous speech     Fund of knowledge   CRANIAL NERVES: CN II:  Visual fields are full to confrontation. Fundoscopic exam is normal with sharp discs and no vascular changes. Pupils are round equal and briskly reactive to light. CN III, IV, VI: extraocular movement are normal. No ptosis. CN V: Facial sensation is intact to pinprick in all 3 divisions bilaterally. Corneal responses are intact.  CN VII: Face is symmetric with normal eye closure and smile. CN VIII: Hearing is normal to rubbing fingers CN IX, X: Palate elevates symmetrically. Phonation is normal. CN XI: Head turning and shoulder shrug are intact CN XII: Tongue is midline with normal movements and no atrophy.  MOTOR: There is no pronator drift of out-stretched arms. Muscle bulk and tone are normal. Muscle strength is normal.  REFLEXES: Reflexes are 2+ and symmetric at the biceps, triceps, knees, and ankles. Plantar responses are flexor.  SENSORY: Intact to light touch, pinprick, positional sensation and vibratory sensation are intact in fingers and toes.  COORDINATION: Rapid alternating movements and fine finger movements are intact. There is no dysmetria on finger-to-nose and heel-knee-shin.    GAIT/STANCE: Posture is normal. Gait is steady with normal steps, base, arm swing, and turning. Heel and toe walking are normal. Tandem gait is normal.  Romberg is absent.  DIAGNOSTIC DATA (LABS, IMAGING, TESTING) - I reviewed patient records, labs, notes, testing and imaging myself where available.   ASSESSMENT AND PLAN  [name] is a 50 y.o. female    History of HIV, hypercholesterolemia, Non hodgkin' lymphoma with chemotherapy and radiation and migraines since age 59.   She has tried and failed multiple preventive medications including atenolol, verapamil, nortriptyline, and Depakote,Topamax, Topamax ER,   She has migraines daily for more than  10 years. She is currently taking  zonogram  and Zomig with some relief. Previously tried imitrex and Maxalt with suboptimal response,  Normal  MRI brain, and neurological exam.  BOTOX injection was performed according to protocol by [employer]. 100 units of BOTOX was dissolved into 2 cc NS  Used total 200 units  Corrugator 2 sites, 10 units Procerus 1 site, 5 unit Frontalis 4 sites,  20 units, Temporalis 8 sites,  40 units  Occipitalis 6 sites, 30 units Cervical Paraspinal, 4 sites, 20 units Trapezius, 6 sites, 30 units  Extra 45 units were injected along bilateral cervical paraspinal muscles and left skull,  Restless leg symptoms   Add on Mirapex 0.2 5 mg, may take up to 2 tablets every night  Iron panel including ferritin at next visit  Severe migraine headache over the past few days  She has tried and failed Zomig, Imitrex injection,  I have given her Compazine 10 mg, Benadryl 25 mg IV at office today, her headache has much improved afterwards, previous Toradol injection made her headache worse   Marcial Pacas, M.D. Ph.D.  [facility] [address], [place], [place] [address] Ph: [phone] Fax: [phone]  CC: Referring Provider

## 2015-12-24 NOTE — Progress Notes (Signed)
**  Botox 100 units x 2 vials, Lot [id number], Exp 03/2018, Lot [id number], specialty pharmacy.**mck,rn.

## 2016-01-06 ENCOUNTER — Other Ambulatory Visit: Payer: Self-pay | Admitting: Sports Medicine

## 2016-01-08 ENCOUNTER — Other Ambulatory Visit: Payer: Self-pay | Admitting: Gastroenterology

## 2016-01-13 ENCOUNTER — Other Ambulatory Visit: Payer: Self-pay | Admitting: Gastroenterology

## 2016-01-27 ENCOUNTER — Telehealth: Payer: Self-pay | Admitting: Internal Medicine

## 2016-01-27 NOTE — Telephone Encounter (Signed)
Pt has questions for nurse °

## 2016-01-29 NOTE — Telephone Encounter (Signed)
Scheduled for 02/02/16 in [facility]

## 2016-01-29 NOTE — Telephone Encounter (Signed)
"  I had seen Dr. Redmond Pulling before she was leaving she was upposed to sign off on my surgery." Dr. Karin Lieu is to do knee surgery but needs a primary care doctor to sign off. Was supposed to have surgery in July, but wont do the surgery until cleared. Please advise.

## 2016-01-29 NOTE — Telephone Encounter (Signed)
Please schedule in [facility] first available for ortho surgery clearance.

## 2016-02-02 ENCOUNTER — Ambulatory Visit (INDEPENDENT_AMBULATORY_CARE_PROVIDER_SITE_OTHER): Payer: Medicaid Other | Admitting: Internal Medicine

## 2016-02-02 VITALS — BP 164/80 | HR 106 | Temp 97.9°F | Ht 66.0 in | Wt 289.2 lb

## 2016-02-02 DIAGNOSIS — S83242A Other tear of medial meniscus, current injury, left knee, initial encounter: Secondary | ICD-10-CM

## 2016-02-02 DIAGNOSIS — Z21 Asymptomatic human immunodeficiency virus [HIV] infection status: Secondary | ICD-10-CM | POA: Diagnosis not present

## 2016-02-02 DIAGNOSIS — S83282A Other tear of lateral meniscus, current injury, left knee, initial encounter: Secondary | ICD-10-CM | POA: Diagnosis not present

## 2016-02-02 DIAGNOSIS — B20 Human immunodeficiency virus [HIV] disease: Secondary | ICD-10-CM

## 2016-02-02 DIAGNOSIS — Z01818 Encounter for other preprocedural examination: Secondary | ICD-10-CM | POA: Diagnosis present

## 2016-02-02 DIAGNOSIS — X58XXXA Exposure to other specified factors, initial encounter: Secondary | ICD-10-CM

## 2016-02-02 NOTE — Assessment & Plan Note (Signed)
Well controlled on Descovy and Prezcobix. Last CD4 is 590 and HIV viral load <20 in April 2017.   Plan: -Continue Descovy and Prezcobix

## 2016-02-02 NOTE — Assessment & Plan Note (Signed)
Patient presents for pre-operative clearance for a left knee arthroscopic surgery for repair of left knee torn medial and lateral menisci by orthopedic surgery. Surgery is a low risk surgery and patient is a low risk candidate. Her pre-operative risk is Class I (low) giving her a 0.4% risk of a major cardiac risk. I will let her orthopedic surgeon know that she is at low risk. I do not recommend further pre-operative work up.

## 2016-02-02 NOTE — Progress Notes (Signed)
   CC: knee pain  HPI:  [name] is a 50 y.o. F with PMHx of chronic migraines, HIV, NHL x/p chemoradiation and resection in remission since 2005 and left torn medial and lateral menisci, patellofemoral chondromalacia and OA who presents for preoperative clearance for left knee arthroscopic surgery.  Plans are for patient to undergo an arthroscopic repair of her torn medial and lateral menisci of her left knee. Patient denies any personal history of heart disease such as CAD or CHF. She denies any history of chest pain or DOE. She has never had a stress test. EKG from July 2016 shows NSR with without pathologic Q waves, ST or TW changes. Patient does have risk factors for CAD including HLD and obesity. BMET from April 2017 is WNL. Patient is limited in activity due to pain but states she is able to perform all of her ADLs, walk a block or 2 on level ground, do light work around the house such as vacuumming, dusting, sweeping floors and washing dishes without dyspnea or chest pain giving her at least a MET score of 3.  Past Medical History:  Diagnosis Date  . Acute gastritis without mention of hemorrhage 01/01/2002  . Anemia 10/2009   macrocytic anemia with baseline MCV 104-106  . Anxiety   . Asthma   . 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 Duodenitis without mention of hemorrhage 01/01/2002  . DVT (deep venous thrombosis) (HCC)    history clot lt groin when she had lymphoma-in remision now.  . Gastroparesis   . GERD (gastroesophageal reflux disease)   . Hiatal hernia 01/01/2002  . HIV infection ([place])    CD4 = 570 (05/2010), VL undetectation  . Hyperlipidemia   . Hypertension    no meds  . IBS (irritable bowel syndrome)   . Migraines    on topamax and triptans, frequent (almost daily) attacks   . 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 Beryle Beams is her oncologist.   . PUD (peptic ulcer disease) 07/1997   per EGD report 07/1997  with history of esophagitis  . Restless leg syndrome 12/24/2015  . Shingles    in lumbar dermatome  . Spondylolisthesis of lumbar region    L5-S1  . Traumatic tear of lateral meniscus of right knee 04/20/2012    Review of Systems:   A complete ROS was negative except as per HPI.   Physical Exam:  Vitals:   02/02/16 1419  BP: (!) 164/80  Pulse: (!) 106  Temp: 97.9 F (36.6 C)  TempSrc: Oral  SpO2: 99%  Weight: 289 lb 3.2 oz (131.2 kg)  Height: '5\' 6"'$  (1.676 m)   General: Vital signs reviewed.  Patient is obese, in no acute distress and cooperative with exam.  Neck: Supple, trachea midline, no carotid bruit present.  Cardiovascular: RRR, S1 normal, S2 normal, no murmurs, gallops, or rubs. Pulmonary/Chest: Clear to auscultation bilaterally, no wheezes, rales, or rhonchi. Abdominal: Soft, non-tender, non-distended, BS +, obese.  Musculoskeletal: Tenderness on palpation of left knee.  Extremities: No lower extremity edema bilaterally  Skin: Warm, dry and intact.  Psychiatric: Normal mood and affect. speech and behavior is normal. Cognition and memory are normal.    Assessment & Plan:   See Encounters Tab for problem based charting.   Patient discussed with Dr. Dareen Piano

## 2016-02-02 NOTE — Patient Instructions (Signed)
I will send in your pre-operative clearance to your orthopedic physician as soon as I receive the paperwork. Please follow up with your primary care physician for your chronic medical conditions.

## 2016-02-12 NOTE — Progress Notes (Signed)
Internal Medicine Clinic Attending  Case discussed with Dr. Burns at the time of the visit.  We reviewed the resident's history and exam and pertinent patient test results.  I agree with the assessment, diagnosis, and plan of care documented in the resident's note.  

## 2016-02-17 NOTE — Telephone Encounter (Signed)
error 

## 2016-02-23 ENCOUNTER — Other Ambulatory Visit: Payer: Self-pay | Admitting: *Deleted

## 2016-02-23 ENCOUNTER — Telehealth: Payer: Self-pay | Admitting: Neurology

## 2016-02-23 ENCOUNTER — Telehealth: Payer: Self-pay | Admitting: *Deleted

## 2016-02-23 MED ORDER — DICLOFENAC SODIUM 1 % TD GEL: 2.0000 g | Freq: Four times a day (QID) | TRANSDERMAL | 2 refills | Status: DC

## 2016-02-23 NOTE — Telephone Encounter (Signed)
PA will need to be initiated through [place] [employer].

## 2016-02-23 NOTE — Telephone Encounter (Signed)
Patient is requesting a refill of her boric acid be sent to [facility].

## 2016-02-23 NOTE — Telephone Encounter (Signed)
Patient called to request refill of zolmitriptan (ZOMIG) 5 MG tablet, pharmacy advised patient this medication needs PA. Patient states "she needs this medication today".

## 2016-02-23 NOTE — Telephone Encounter (Signed)
Yes, please. Thank you. R.Taegen Delker CNM

## 2016-02-24 ENCOUNTER — Other Ambulatory Visit: Payer: Self-pay

## 2016-02-24 ENCOUNTER — Encounter (HOSPITAL_BASED_OUTPATIENT_CLINIC_OR_DEPARTMENT_OTHER)
Admission: RE | Admit: 2016-02-24 | Discharge: 2016-02-24 | Disposition: A | Discharge status: Home / Self Care | Payer: Medicaid Other | Source: Ambulatory Visit | Attending: Orthopedic Surgery | Admitting: Orthopedic Surgery

## 2016-02-24 ENCOUNTER — Encounter (HOSPITAL_BASED_OUTPATIENT_CLINIC_OR_DEPARTMENT_OTHER): Payer: Self-pay | Admitting: *Deleted

## 2016-02-24 ENCOUNTER — Other Ambulatory Visit: Payer: Self-pay | Admitting: Orthopedic Surgery

## 2016-02-24 DIAGNOSIS — Z01818 Encounter for other preprocedural examination: Secondary | ICD-10-CM | POA: Insufficient documentation

## 2016-02-24 DIAGNOSIS — Z79899 Other long term (current) drug therapy: Secondary | ICD-10-CM | POA: Diagnosis not present

## 2016-02-24 DIAGNOSIS — S83242A Other tear of medial meniscus, current injury, left knee, initial encounter: Secondary | ICD-10-CM | POA: Insufficient documentation

## 2016-02-24 DIAGNOSIS — G2581 Restless legs syndrome: Secondary | ICD-10-CM | POA: Diagnosis not present

## 2016-02-24 DIAGNOSIS — S83282A Other tear of lateral meniscus, current injury, left knee, initial encounter: Secondary | ICD-10-CM | POA: Insufficient documentation

## 2016-02-24 DIAGNOSIS — E785 Hyperlipidemia, unspecified: Secondary | ICD-10-CM | POA: Diagnosis not present

## 2016-02-24 DIAGNOSIS — Z86718 Personal history of other venous thrombosis and embolism: Secondary | ICD-10-CM | POA: Diagnosis not present

## 2016-02-24 DIAGNOSIS — K219 Gastro-esophageal reflux disease without esophagitis: Secondary | ICD-10-CM | POA: Diagnosis not present

## 2016-02-24 DIAGNOSIS — F419 Anxiety disorder, unspecified: Secondary | ICD-10-CM | POA: Diagnosis not present

## 2016-02-24 DIAGNOSIS — J449 Chronic obstructive pulmonary disease, unspecified: Secondary | ICD-10-CM | POA: Diagnosis not present

## 2016-02-24 DIAGNOSIS — Z8572 Personal history of non-Hodgkin lymphomas: Secondary | ICD-10-CM | POA: Diagnosis not present

## 2016-02-24 DIAGNOSIS — G43909 Migraine, unspecified, not intractable, without status migrainosus: Secondary | ICD-10-CM | POA: Diagnosis not present

## 2016-02-24 DIAGNOSIS — X58XXXA Exposure to other specified factors, initial encounter: Secondary | ICD-10-CM | POA: Diagnosis not present

## 2016-02-24 DIAGNOSIS — Z7951 Long term (current) use of inhaled steroids: Secondary | ICD-10-CM | POA: Diagnosis not present

## 2016-02-24 DIAGNOSIS — B20 Human immunodeficiency virus [HIV] disease: Secondary | ICD-10-CM | POA: Diagnosis not present

## 2016-02-24 NOTE — Telephone Encounter (Signed)
Pt called in today about rx for Zomig. State she is having knee surgery tomorrow and is out of medication. She is having migraines. Please call [phone]

## 2016-02-24 NOTE — Progress Notes (Signed)
Anesthesia consult by Dr Senaida Ores no further treatment needed

## 2016-02-24 NOTE — Telephone Encounter (Signed)
PA approved by [facility] Medicaid - [id number] - valid through 02/23/17. Pt and pharmacy aware of approval.

## 2016-02-25 ENCOUNTER — Ambulatory Visit (HOSPITAL_BASED_OUTPATIENT_CLINIC_OR_DEPARTMENT_OTHER)
Admission: RE | Admit: 2016-02-25 | Discharge: 2016-02-25 | Disposition: A | Discharge status: Home / Self Care | Payer: Medicaid Other | Source: Ambulatory Visit | Attending: Orthopedic Surgery | Admitting: Orthopedic Surgery

## 2016-02-25 ENCOUNTER — Encounter (HOSPITAL_BASED_OUTPATIENT_CLINIC_OR_DEPARTMENT_OTHER): Payer: Self-pay | Admitting: Anesthesiology

## 2016-02-25 ENCOUNTER — Encounter (HOSPITAL_BASED_OUTPATIENT_CLINIC_OR_DEPARTMENT_OTHER)
Admission: RE | Disposition: A | Discharge status: Home / Self Care | Payer: Self-pay | Source: Ambulatory Visit | Attending: Orthopedic Surgery

## 2016-02-25 ENCOUNTER — Ambulatory Visit (HOSPITAL_BASED_OUTPATIENT_CLINIC_OR_DEPARTMENT_OTHER): Payer: Medicaid Other | Admitting: Anesthesiology

## 2016-02-25 DIAGNOSIS — S83242A Other tear of medial meniscus, current injury, left knee, initial encounter: Secondary | ICD-10-CM | POA: Insufficient documentation

## 2016-02-25 DIAGNOSIS — K219 Gastro-esophageal reflux disease without esophagitis: Secondary | ICD-10-CM | POA: Insufficient documentation

## 2016-02-25 DIAGNOSIS — S83232A Complex tear of medial meniscus, current injury, left knee, initial encounter: Secondary | ICD-10-CM

## 2016-02-25 DIAGNOSIS — E785 Hyperlipidemia, unspecified: Secondary | ICD-10-CM | POA: Insufficient documentation

## 2016-02-25 DIAGNOSIS — J449 Chronic obstructive pulmonary disease, unspecified: Secondary | ICD-10-CM | POA: Insufficient documentation

## 2016-02-25 DIAGNOSIS — X58XXXA Exposure to other specified factors, initial encounter: Secondary | ICD-10-CM | POA: Insufficient documentation

## 2016-02-25 DIAGNOSIS — Z8572 Personal history of non-Hodgkin lymphomas: Secondary | ICD-10-CM | POA: Insufficient documentation

## 2016-02-25 DIAGNOSIS — B20 Human immunodeficiency virus [HIV] disease: Secondary | ICD-10-CM | POA: Diagnosis not present

## 2016-02-25 DIAGNOSIS — Z86718 Personal history of other venous thrombosis and embolism: Secondary | ICD-10-CM | POA: Insufficient documentation

## 2016-02-25 DIAGNOSIS — Z79899 Other long term (current) drug therapy: Secondary | ICD-10-CM | POA: Insufficient documentation

## 2016-02-25 DIAGNOSIS — S83282A Other tear of lateral meniscus, current injury, left knee, initial encounter: Secondary | ICD-10-CM | POA: Insufficient documentation

## 2016-02-25 DIAGNOSIS — Z7951 Long term (current) use of inhaled steroids: Secondary | ICD-10-CM | POA: Insufficient documentation

## 2016-02-25 DIAGNOSIS — G43909 Migraine, unspecified, not intractable, without status migrainosus: Secondary | ICD-10-CM | POA: Diagnosis not present

## 2016-02-25 DIAGNOSIS — M23342 Other meniscus derangements, anterior horn of lateral meniscus, left knee: Secondary | ICD-10-CM

## 2016-02-25 DIAGNOSIS — F419 Anxiety disorder, unspecified: Secondary | ICD-10-CM | POA: Insufficient documentation

## 2016-02-25 DIAGNOSIS — G2581 Restless legs syndrome: Secondary | ICD-10-CM | POA: Insufficient documentation

## 2016-02-25 HISTORY — DX: Complex tear of medial meniscus, current injury, left knee, initial encounter: S83.232A

## 2016-02-25 HISTORY — PX: KNEE ARTHROSCOPY WITH LATERAL MENISECTOMY: SHX6193

## 2016-02-25 HISTORY — DX: Other meniscus derangements, anterior horn of lateral meniscus, left knee: M23.342

## 2016-02-25 HISTORY — PX: KNEE ARTHROSCOPY WITH MEDIAL MENISECTOMY: SHX5651

## 2016-02-25 HISTORY — DX: Other complications of anesthesia, initial encounter: T88.59XA

## 2016-02-25 HISTORY — DX: Dislocation of jaw, unspecified side, initial encounter: S03.00XA

## 2016-02-25 HISTORY — DX: Adverse effect of unspecified anesthetic, initial encounter: T41.45XA

## 2016-02-25 HISTORY — DX: Chronic obstructive pulmonary disease, unspecified: J44.9

## 2016-02-25 SURGERY — ARTHROSCOPY, KNEE, WITH LATERAL MENISCECTOMY
Anesthesia: General | Site: Knee | Laterality: Left

## 2016-02-25 MED ORDER — ENOXAPARIN SODIUM 40 MG/0.4ML ~~LOC~~ SOLN: 40.0000 mg | SUBCUTANEOUS | 0 refills | Status: DC

## 2016-02-25 MED ORDER — HYDROMORPHONE HCL 2 MG PO TABS: 2.0000 mg | ORAL_TABLET | ORAL | 0 refills | Status: DC | PRN

## 2016-02-25 MED ORDER — BUPIVACAINE HCL (PF) 0.5 % IJ SOLN
INTRAMUSCULAR | Status: DC | PRN
  Administered 2016-02-25: 20 mL via INTRA_ARTICULAR

## 2016-02-25 MED ORDER — CEFAZOLIN SODIUM-DEXTROSE 2-4 GM/100ML-% IV SOLN
INTRAVENOUS | Status: AC
  Filled 2016-02-25: qty 200

## 2016-02-25 MED ORDER — MIDAZOLAM HCL 2 MG/2ML IJ SOLN: 1.0000 mg | INTRAMUSCULAR | Status: DC | PRN

## 2016-02-25 MED ORDER — FENTANYL CITRATE (PF) 100 MCG/2ML IJ SOLN
INTRAMUSCULAR | Status: AC
  Filled 2016-02-25: qty 2

## 2016-02-25 MED ORDER — PROMETHAZINE HCL 25 MG/ML IJ SOLN
INTRAMUSCULAR | Status: AC
  Filled 2016-02-25: qty 1

## 2016-02-25 MED ORDER — SODIUM CHLORIDE 0.9 % IJ SOLN
INTRAMUSCULAR | Status: AC
  Filled 2016-02-25: qty 10

## 2016-02-25 MED ORDER — SCOPOLAMINE 1 MG/3DAYS TD PT72: 1.0000 | MEDICATED_PATCH | Freq: Once | TRANSDERMAL | Status: DC | PRN

## 2016-02-25 MED ORDER — SODIUM CHLORIDE 0.9 % IR SOLN
Status: DC | PRN
  Administered 2016-02-25: 5000 mL

## 2016-02-25 MED ORDER — MIDAZOLAM HCL 2 MG/2ML IJ SOLN
INTRAMUSCULAR | Status: AC
  Filled 2016-02-25: qty 2

## 2016-02-25 MED ORDER — LACTATED RINGERS IV SOLN
INTRAVENOUS | Status: DC
  Administered 2016-02-25: 12:00:00 via INTRAVENOUS

## 2016-02-25 MED ORDER — PROMETHAZINE HCL 25 MG/ML IJ SOLN
6.2500 mg | INTRAMUSCULAR | Status: DC | PRN
  Administered 2016-02-25: 12.5 mg via INTRAVENOUS

## 2016-02-25 MED ORDER — FENTANYL CITRATE (PF) 100 MCG/2ML IJ SOLN
50.0000 ug | INTRAMUSCULAR | Status: AC | PRN
  Administered 2016-02-25 (×2): 50 ug via INTRAVENOUS
  Administered 2016-02-25: 100 ug via INTRAVENOUS

## 2016-02-25 MED ORDER — OXYCODONE HCL 5 MG PO TABS: 5.0000 mg | ORAL_TABLET | ORAL | 0 refills | Status: DC | PRN

## 2016-02-25 MED ORDER — HYDROMORPHONE HCL 2 MG PO TABS
ORAL_TABLET | ORAL | Status: AC
  Filled 2016-02-25: qty 1

## 2016-02-25 MED ORDER — PROPOFOL 10 MG/ML IV BOLUS
INTRAVENOUS | Status: DC | PRN
  Administered 2016-02-25: 200 mg via INTRAVENOUS

## 2016-02-25 MED ORDER — CEFAZOLIN SODIUM-DEXTROSE 2-4 GM/100ML-% IV SOLN
2.0000 g | INTRAVENOUS | Status: AC
  Administered 2016-02-25: 3 g via INTRAVENOUS

## 2016-02-25 MED ORDER — BUPIVACAINE HCL (PF) 0.5 % IJ SOLN
INTRAMUSCULAR | Status: AC
  Filled 2016-02-25: qty 30

## 2016-02-25 MED ORDER — LIDOCAINE 2% (20 MG/ML) 5 ML SYRINGE
INTRAMUSCULAR | Status: DC | PRN
  Administered 2016-02-25: 50 mg via INTRAVENOUS

## 2016-02-25 MED ORDER — HYDROMORPHONE HCL 2 MG PO TABS
2.0000 mg | ORAL_TABLET | Freq: Once | ORAL | Status: AC
  Administered 2016-02-25: 2 mg via ORAL

## 2016-02-25 MED ORDER — GLYCOPYRROLATE 0.2 MG/ML IJ SOLN: 0.2000 mg | Freq: Once | INTRAMUSCULAR | Status: DC | PRN

## 2016-02-25 MED ORDER — FENTANYL CITRATE (PF) 100 MCG/2ML IJ SOLN
25.0000 ug | INTRAMUSCULAR | Status: DC | PRN
  Administered 2016-02-25 (×3): 50 ug via INTRAVENOUS

## 2016-02-25 MED ORDER — ONDANSETRON HCL 4 MG/2ML IJ SOLN
INTRAMUSCULAR | Status: AC
  Filled 2016-02-25: qty 2

## 2016-02-25 MED ORDER — DEXAMETHASONE SODIUM PHOSPHATE 4 MG/ML IJ SOLN
INTRAMUSCULAR | Status: DC | PRN
  Administered 2016-02-25: 10 mg via INTRAVENOUS

## 2016-02-25 MED ORDER — LIDOCAINE 2% (20 MG/ML) 5 ML SYRINGE
INTRAMUSCULAR | Status: AC
  Filled 2016-02-25: qty 5

## 2016-02-25 SURGICAL SUPPLY — 39 items
BANDAGE ACE 6X5 VEL STRL LF (GAUZE/BANDAGES/DRESSINGS) ×3 IMPLANT
BLADE CUTTER GATOR 3.5 (BLADE) ×3 IMPLANT
CLOSURE STERI-STRIP 1/2X4 (GAUZE/BANDAGES/DRESSINGS) ×1
CLSR STERI-STRIP ANTIMIC 1/2X4 (GAUZE/BANDAGES/DRESSINGS) ×2 IMPLANT
CUTTER KNOT PUSHER 2-0 FIBERWI (INSTRUMENTS) IMPLANT
CUTTER MENISCUS  4.2MM (BLADE)
CUTTER MENISCUS 4.2MM (BLADE) IMPLANT
DRAPE ARTHROSCOPY W/POUCH 90 (DRAPES) ×3 IMPLANT
DRAPE IMP U-DRAPE 54X76 (DRAPES) ×3 IMPLANT
DURAPREP 26ML APPLICATOR (WOUND CARE) ×3 IMPLANT
ELECT MENISCUS 165MM 90D (ELECTRODE) IMPLANT
ELECT REM PT RETURN 9FT ADLT (ELECTROSURGICAL)
ELECTRODE REM PT RTRN 9FT ADLT (ELECTROSURGICAL) IMPLANT
GAUZE SPONGE 4X4 12PLY STRL (GAUZE/BANDAGES/DRESSINGS) ×3 IMPLANT
GLOVE BIO SURGEON STRL SZ8 (GLOVE) ×3 IMPLANT
GLOVE BIOGEL PI IND STRL 7.0 (GLOVE) ×2 IMPLANT
GLOVE BIOGEL PI IND STRL 8 (GLOVE) ×2 IMPLANT
GLOVE BIOGEL PI INDICATOR 7.0 (GLOVE) ×4
GLOVE BIOGEL PI INDICATOR 8 (GLOVE) ×4
GLOVE ECLIPSE 6.5 STRL STRAW (GLOVE) ×3 IMPLANT
GLOVE ORTHO TXT STRL SZ7.5 (GLOVE) ×3 IMPLANT
GOWN STRL REUS W/ TWL LRG LVL3 (GOWN DISPOSABLE) ×1 IMPLANT
GOWN STRL REUS W/ TWL XL LVL3 (GOWN DISPOSABLE) ×2 IMPLANT
GOWN STRL REUS W/TWL LRG LVL3 (GOWN DISPOSABLE) ×3
GOWN STRL REUS W/TWL XL LVL3 (GOWN DISPOSABLE) ×4
IV NS IRRIG 3000ML ARTHROMATIC (IV SOLUTION) ×6 IMPLANT
KNEE WRAP E Z 3 GEL PACK (MISCELLANEOUS) ×3 IMPLANT
MANIFOLD NEPTUNE II (INSTRUMENTS) ×3 IMPLANT
PACK ARTHROSCOPY DSU (CUSTOM PROCEDURE TRAY) ×3 IMPLANT
PACK BASIN DAY SURGERY FS (CUSTOM PROCEDURE TRAY) ×3 IMPLANT
PENCIL BUTTON HOLSTER BLD 10FT (ELECTRODE) IMPLANT
SET ARTHROSCOPY TUBING (MISCELLANEOUS) ×3
SET ARTHROSCOPY TUBING LN (MISCELLANEOUS) ×1 IMPLANT
SLEEVE SCD COMPRESS KNEE MED (MISCELLANEOUS) ×3 IMPLANT
SUT MNCRL AB 4-0 PS2 18 (SUTURE) ×3 IMPLANT
TOWEL OR 17X24 6PK STRL BLUE (TOWEL DISPOSABLE) ×3 IMPLANT
TOWEL OR NON WOVEN STRL DISP B (DISPOSABLE) ×3 IMPLANT
WAND STAR VAC 90 (SURGICAL WAND) IMPLANT
WATER STERILE IRR 1000ML POUR (IV SOLUTION) ×3 IMPLANT

## 2016-02-25 NOTE — Anesthesia Postprocedure Evaluation (Addendum)
Anesthesia Post Note  Patient: [name]  Procedure(s) Performed: Procedure(s) (LRB): LEFT KNEE ARTHROSCOPY WITH MEDIAL AND  LATERAL MENISECTOMIES (Left) KNEE ARTHROSCOPY WITH MEDIAL MENISECTOMY (Left)  Patient location during evaluation: PACU Anesthesia Type: General Level of consciousness: awake and alert Pain management: pain level controlled Vital Signs Assessment: post-procedure vital signs reviewed and stable Respiratory status: spontaneous breathing, nonlabored ventilation, respiratory function stable and patient connected to nasal cannula oxygen Cardiovascular status: blood pressure returned to baseline and stable Postop Assessment: no signs of nausea or vomiting Anesthetic complications: no    Last Vitals:  Vitals:   02/25/16 1421 02/25/16 1440  BP:  (!) 143/85  Pulse: 89 91  Resp: 19 16  Temp:  37.1 C    Last Pain:  Vitals:   02/25/16 1440  TempSrc: Axillary  PainSc: 10-Worst pain ever                 Montez Hageman

## 2016-02-25 NOTE — Op Note (Signed)
02/25/2016  1:33 PM  PATIENT:  [name]    PRE-OPERATIVE DIAGNOSIS:  LEFT KNEE MEDIAL AND LATERAL MENISCUS TEARS  POST-OPERATIVE DIAGNOSIS:  Same  PROCEDURE:  Left knee arthroscopy with medial and lateral meniscectomy  SURGEON:  Johnny Bridge, MD  PHYSICIAN ASSISTANT: Joya Gaskins, OPA-C, present and scrubbed throughout the case, critical for completion in a timely fashion, and for retraction, instrumentation, and closure.  ANESTHESIA:   General  PREOPERATIVE INDICATIONS:  [name] is a  50 y.o. female who has a complex medical history and a history of previous right knee arthroscopy for meniscal pathology, who presented with persistent left knee pain and an MRI that demonstrated medial meniscus tear and anterior horn lateral meniscus tear. She failed conservative measures and elected for surgical management.  The risks benefits and alternatives were discussed with the patient preoperatively including but not limited to the risks of infection, bleeding, nerve injury, cardiopulmonary complications, the need for revision surgery, among others, and the patient was willing to proceed. We also discussed the risks for progression of knee arthritis, incomplete relief of pain, among others.  OPERATIVE IMPLANTS: None  OPERATIVE FINDINGS: Left knee has full motion with intact stability. Moderate chondromalacia under the patella, grade 2 that was fairly poorly isolated, the medial femoral condyle was intact with the exception of a small area of grade 3 chondral changes that was about 0.5 cm x 0.5 cm, but no exposed bone. The lateral tibial plateau had a small area of grade 4 changes, but it was not amenable to microfracture, it had fairly shallow and poor walls. Lateral meniscus had some undersurface fraying in the posterior horn, as well as an anterior insertional root tear, which was debrided. The medial meniscus had a complex tear that was a cleavage type component that extended to the  capsule from the level of the mid body around to the posterior horn. The lateral articular cartilage was in reasonable condition with the exception of that grade 4 lesion. She would not be a candidate for partial knee replacement if she progresses with knee arthritis based on what was seen in the lateral compartment. The medial tibial condyle had some delamination, although it was not unroofed, but threatening.  OPERATIVE PROCEDURE: The patient is brought to the operating room and placed in the supine position. Gen. anesthesia was administered. IV antibiotics were given. The left lower extremity was prepped and draped in usual sterile fashion. Time out performed. Diagnostic arthroscopy carried out the above-named findings. The arthroscopic shaver was used to perform a light chondroplasty of the undersurface of the patella, and I also used the arthroscopic basket and a biter to debride the medial meniscus back to a stable configuration. The cleavage tear was quite deep, and I had to take most of the meniscus from the mid body around the posterior horn. The anterior horn was still intact.  I performed a light chondroplasty of the unstable flap of cartilage on the medial femoral condyle, the medial tibial condyle had intact coverage, however it was somewhat delaminated below the surface of the cartilage, and it was as if the surface of the cartilage was disconnected and somewhat mobile, but there were no real flap tears and I did not take this down.  After complete debridement of the medial side, I turned my attention lateral meniscus and debrided any undersurface fraying of the lateral meniscus, and also debrided the anterior horn tear which was involving the anterior root. I removed enough this to stabilize the  torn tissue.  I did have to switch portals in order to achieve this.  The instruments were then removed, the portals closed with Monocryl followed by Steri-Strips and sterile gauze. She was awakened  and returned to PACU in stable and satisfactory condition. She will be placed on Lovenox for DVT prophylaxis given her history of DVT, and I will also write her for Dilaudid as well as oxycodone, she is on chronic narcotics already, and I will effectively maintain the medications that she is on and had the above named medications for brief period of time until her postoperative pain acutely has resolved.

## 2016-02-25 NOTE — Anesthesia Procedure Notes (Signed)
Procedure Name: LMA Insertion Performed by: Shaheen Mende W Pre-anesthesia Checklist: Patient identified, Emergency Drugs available, Suction available and Patient being monitored Patient Re-evaluated:Patient Re-evaluated prior to inductionOxygen Delivery Method: Circle system utilized Preoxygenation: Pre-oxygenation with 100% oxygen Intubation Type: IV induction Ventilation: Mask ventilation without difficulty LMA: LMA inserted LMA Size: 4.0 Number of attempts: 1 Placement Confirmation: positive ETCO2 Tube secured with: Tape Dental Injury: Teeth and Oropharynx as per pre-operative assessment        

## 2016-02-25 NOTE — Transfer of Care (Signed)
Immediate Anesthesia Transfer of Care Note  Patient: [name] May  Procedure(s) Performed: Procedure(s): LEFT KNEE ARTHROSCOPY WITH MEDIAL AND  LATERAL MENISECTOMIES (Left) KNEE ARTHROSCOPY WITH MEDIAL MENISECTOMY (Left)  Patient Location: PACU  Anesthesia Type:General  Level of Consciousness: awake and sedated  Airway & Oxygen Therapy: Patient Spontanous Breathing  Post-op Assessment: Report given to RN and Post -op Vital signs reviewed and stable  Post vital signs: Reviewed and stable  Last Vitals:  Vitals:   02/25/16 1128  BP: (!) 157/93  Pulse: 99  Resp: 18  Temp: 36.8 C    Last Pain:  Vitals:   02/25/16 1128  TempSrc: Oral  PainSc: 10-Worst pain ever      Patients Stated Pain Goal: 5 ([phone] [id number])  Complications: No apparent anesthesia complications

## 2016-02-25 NOTE — Discharge Instructions (Signed)
Post Anesthesia Home Care Instructions  Activity: Get plenty of rest for the remainder of the day. A responsible adult should stay with you for 24 hours following the procedure.  For the next 24 hours, DO NOT: -Drive a car -Paediatric nurse -Drink alcoholic beverages -Take any medication unless instructed by your physician -Make any legal decisions or sign important papers.  Meals: Start with liquid foods such as gelatin or soup. Progress to regular foods as tolerated. Avoid greasy, spicy, heavy foods. If nausea and/or vomiting occur, drink only clear liquids until the nausea and/or vomiting subsides. Call your physician if vomiting continues.  Special Instructions/Symptoms: Your throat may feel dry or sore from the anesthesia or the breathing tube placed in your throat during surgery. If this causes discomfort, gargle with warm salt water. The discomfort should disappear within 24 hours.  If you had a scopolamine patch placed behind your ear for the management of post- operative nausea and/or vomiting:  1. The medication in the patch is effective for 72 hours, after which it should be removed.  Wrap patch in a tissue and discard in the trash. Wash hands thoroughly with soap and water. 2. You may remove the patch earlier than 72 hours if you experience unpleasant side effects which may include dry mouth, dizziness or visual disturbances. 3. Avoid touching the patch. Wash your hands with soap and water after contact with the patch.      Diet: As you were doing prior to hospitalization   Shower:  May shower but keep the wounds dry, use an occlusive plastic wrap, NO SOAKING IN TUB.  If the bandage gets wet, change with a clean dry gauze.  If you have a splint on, leave the splint in place and keep the splint dry with a plastic bag.  Dressing:  You may change your dressing 3-5 days after surgery, unless you have a splint.  If you have a splint, then just leave the splint in place and we  will change your bandages during your first follow-up appointment.    If you had hand or foot surgery, we will plan to remove your stitches in about 2 weeks in the office.  For all other surgeries, there are sticky tapes (steri-strips) on your wounds and all the stitches are absorbable.  Leave the steri-strips in place when changing your dressings, they will peel off with time, usually 2-3 weeks.  Activity:  Increase activity slowly as tolerated, but follow the weight bearing instructions below.  The rules on driving is that you can not be taking narcotics while you drive, and you must feel in control of the vehicle.    Weight Bearing:   As tolerated.    To prevent constipation: you may use a stool softener such as -  Colace (over the counter) 100 mg by mouth twice a day  Drink plenty of fluids (prune juice may be helpful) and high fiber foods Miralax (over the counter) for constipation as needed.    Itching:  If you experience itching with your medications, try taking only a single pain pill, or even half a pain pill at a time.  You may take up to 10 pain pills per day, and you can also use benadryl over the counter for itching or also to help with sleep.   Precautions:  If you experience chest pain or shortness of breath - call 911 immediately for transfer to the hospital emergency department!!  If you develop a fever greater that 101 F,  purulent drainage from wound, increased redness or drainage from wound, or calf pain -- Call the office at [phone]                                                Follow- Up Appointment:  Please call for an appointment to be seen in 2 weeks [place] - [phone]

## 2016-02-25 NOTE — H&P (Signed)
PREOPERATIVE H&P  Chief Complaint: Left knee pain  HPI: [name] is a 50 y.o. female left knee pain. Pain is been present for about 6 months. She's had x-ray, MRI, ultrasound, injections, anti-inflammatories, and continues to have miserable pain. She is on chronic narcotics as well, has a medical history for HIV and migraines, as well as lymphoma and chronic back pain.  I have performed a contralateral knee arthroscopy in the past for meniscal tears and ultimately she did well after this. She wishes for a left knee arthroscopy with meniscal debridement.  Past Medical History:  Diagnosis Date  . Acute gastritis without mention of hemorrhage 01/01/2002  . Anemia 10/2009   macrocytic anemia with baseline MCV 104-106  . Anxiety   . Asthma   . 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 Complication of anesthesia    hard to wake up  . COPD (chronic obstructive pulmonary disease) (HCC)    chronic bronchitis  . Duodenitis without mention of hemorrhage 01/01/2002  . DVT (deep venous thrombosis) (HCC)    history clot lt groin when she had lymphoma-in remision now.  . Gastroparesis   . GERD (gastroesophageal reflux disease)   . Hiatal hernia 01/01/2002  . HIV infection ([place])    CD4 = 570 (05/2010), VL undetectation  . Hyperlipidemia   . IBS (irritable bowel syndrome)   . Migraines    on topamax and triptans, frequent (almost daily) attacks   . 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 Beryle Beams is her oncologist.   . PUD (peptic ulcer disease) 07/1997   per EGD report 07/1997 with history of esophagitis  . Restless leg syndrome 12/24/2015  . Shingles    in lumbar dermatome  . Spondylolisthesis of lumbar region    L5-S1  . TMJ (dislocation of temporomandibular joint)   . Traumatic tear of lateral meniscus of right knee 04/20/2012   Past Surgical History:  Procedure Laterality Date  . CHOLECYSTECTOMY    . ENDOMETRIAL ABLATION    . KNEE  ARTHROSCOPY Right 04/20/12  . TONSILLECTOMY    . TYMPANOPLASTY Left   . TYMPANOSTOMY TUBE PLACEMENT     as child-both  . UPPER GASTROINTESTINAL ENDOSCOPY  2003, 2007   done by Dr. Velora Heckler   Social History   Social History  . Marital status: Divorced    Spouse name: N/A  . Number of children: 0  . Years of education: 12   Occupational History  . disabled Unemployed   Social History Main Topics  . Smoking status: Never Smoker  . Smokeless tobacco: Never Used  . Alcohol use No  . Drug use: No  . Sexual activity: Not Asked   Other Topics Concern  . None   Social History Narrative      Patient is disabled.   Right handed.   Caffeine - Diet soda., tea   Education - high school   Family History  Problem Relation Age of Onset  . Hypertension Mother   . Stroke Mother   . Heart disease Mother   . Breast cancer Paternal Grandmother   . Kidney cancer Maternal Uncle   . Heart disease Maternal Uncle   . Diabetes Maternal Uncle   . Diabetes Maternal Grandmother   . Heart disease Maternal Grandmother   . Emphysema Paternal Uncle     never smoker  . Emphysema Paternal Grandfather     never smoker  . Colon cancer Paternal Aunt  with met to lung   . Non-Hodgkin's lymphoma Maternal Uncle   . Heart disease Maternal Grandfather    Allergies  Allergen Reactions  . Diazepam     Triggers migraines  . Divalproex Sodium Nausea And Vomiting    Triggers migraines  . Gabapentin Nausea And Vomiting    confusion  . Ondansetron Hcl Nausea And Vomiting    Triggers migraines, "makes me vomit"  . Penicillins     REACTION: lips swollen to point of bleeding, sever vaginal irritation  Reports tolerating augmentin well without any complaints or reaction  . Propoxyphene N-Acetaminophen Nausea And Vomiting  . Simvastatin     PATIENT CANNOT BE PRESCRIBED THIS STATIN WHILE RECEIVING NORVIR  . Topamax [Topiramate]     Migraines   . Tramadol Hcl Itching    REACTION: migraines and  itching--hx of migraines even without medication   . Zocor [Simvastatin - High Dose]    Prior to Admission medications   Medication Sig Start Date End Date Taking? Authorizing Provider  Beclomethasone Dipropionate 80 MCG/ACT AERS Place 2 Inhalers into the nose daily. 09/01/14  Yes Truman Hayward, MD  botulinum toxin Type A (BOTOX) 100 UNITS SOLR injection INJECT 200 UNITS IM INTO  HEAD NECK EVERY 3 MONTHS 01/24/14  Yes Historical Provider, MD  Calcium Carbonate-Vitamin D (CALCIUM-VITAMIN D) 500-200 MG-UNIT per tablet Take 2 tablets by mouth daily.  06/14/11  Yes Jenelle Mages, MD  diclofenac sodium (VOLTAREN) 1 % GEL Apply 2 g topically 4 (four) times daily. 02/23/16  Yes Stefanie Libel, MD  DULERA 100-5 MCG/ACT AERO INHALE 2 PUFFS FIRST THING IN THE MORNING AND THEN ANOTHER 2 PUFFS ABOUT 12 HOURS LATER 07/17/15  Yes Truman Hayward, MD  emtricitabine-tenofovir AF (DESCOVY) 200-25 MG per tablet Take 1 tablet by mouth daily. 03/25/15  Yes Truman Hayward, MD  fluconazole (DIFLUCAN) 100 MG tablet Take 1 tablet (100 mg total) by mouth daily. 08/10/15  Yes Truman Hayward, MD  LORazepam (ATIVAN) 0.5 MG tablet Take 1 tablet (0.5 mg total) by mouth daily as needed for anxiety. 12/16/15  Yes Alex Ronnie Derby, DO  NEXIUM 40 MG capsule TAKE 1 CAPSULE BY MOUTH 2 TIMES DAILY BEFORE A MEAL 01/13/16  Yes Ladene Artist, MD  oxyCODONE-acetaminophen (PERCOCET/ROXICET) 5-325 MG tablet Take 1 tablet by mouth every 6 (six) hours as needed for severe pain. 12/16/15  Yes Alex Ronnie Derby, DO  pramipexole (MIRAPEX) 0.25 MG tablet Take 2 tablets (0.5 mg total) by mouth at bedtime. 12/24/15  Yes Marcial Pacas, MD  PREZCOBIX 800-150 MG tablet TAKE 1 TABLET BY MOUTH EVERY DAY 12/07/15  Yes Truman Hayward, MD  PROAIR HFA 108 [phone] Base) MCG/ACT inhaler USE 1 TO 2 PUFFS EVERY 4 TO 6 HOURS AS NEEDED SHORTNESS OF BREATH OR WHEEZING 07/17/15  Yes Truman Hayward, MD  prochlorperazine (COMPAZINE) 10 MG tablet Take 1  tablet (10 mg total) by mouth every 6 (six) hours as needed for nausea or vomiting. 12/24/15  Yes Marcial Pacas, MD  scopolamine (TRANSDERM-SCOP, 1.5 MG,) 1 MG/3DAYS Place 1 patch (1.5 mg total) onto the skin every 3 (three) days. 12/24/15  Yes Marcial Pacas, MD  Tamsulosin HCl (FLOMAX) 0.4 MG CAPS Take 0.4 mg by mouth daily. 1-2 daily   Yes Historical Provider, MD  zolmitriptan (ZOMIG) 5 MG tablet Take 1 tablet (5 mg total) by mouth as needed for migraine. 12/24/15  Yes Marcial Pacas, MD  hydrocortisone cream 1 %  APPLY TO AFFECTED AREA TWICE A DAY 04/18/15   Shelly Bombard, MD     Positive ROS: All other systems have been reviewed and were otherwise negative with the exception of those mentioned in the HPI and as above.  Physical Exam: General: Alert, no acute distress Cardiovascular: No pedal edema Respiratory: No cyanosis, no use of accessory musculature GI: No organomegaly, abdomen is soft and non-tender Skin: No lesions in the area of chief complaint Neurologic: Sensation intact distally Psychiatric: Patient is competent for consent with normal mood and affect Lymphatic: No axillary or cervical lymphadenopathy  MUSCULOSKELETAL: Left knee has range of motion from 10 to 95 limited secondary to pain with diffuse joint line tenderness.  Assessment: Left knee medial and lateral meniscal tears   Plan: Plan for Procedure(s): Left knee arthroscopy with medial and lateral meniscectomy  The risks benefits and alternatives were discussed with the patient including but not limited to the risks of nonoperative treatment, versus surgical intervention including infection, bleeding, nerve injury,  blood clots, cardiopulmonary complications, morbidity, mortality, among others, and they were willing to proceed. We also discussed the risks for incomplete relief of symptoms, progression of knee arthritis, among others.  Johnny Bridge, MD Cell [phone]   02/25/2016 12:05 PM

## 2016-02-25 NOTE — Anesthesia Preprocedure Evaluation (Signed)
Anesthesia Evaluation  Patient identified by MRN, date of birth, ID band Patient awake    Reviewed: Allergy & Precautions, NPO status , Patient's Chart, lab work & pertinent test results  History of Anesthesia Complications (+) PROLONGED EMERGENCE and history of anesthetic complications  Airway Mallampati: III  TM Distance: >3 FB Neck ROM: Full    Dental no notable dental hx. (+) Dental Advisory Given   Pulmonary asthma , COPD,  COPD inhaler,    Pulmonary exam normal breath sounds clear to auscultation       Cardiovascular negative cardio ROS Normal cardiovascular exam Rhythm:Regular Rate:Normal     Neuro/Psych  Headaches, PSYCHIATRIC DISORDERS Anxiety Depression    GI/Hepatic Neg liver ROS, GERD  Medicated and Controlled,Took medication this am and denies any current symptoms, associated with food    Endo/Other  negative endocrine ROS  Renal/GU negative Renal ROS  negative genitourinary   Musculoskeletal negative musculoskeletal ROS (+)   Abdominal (+) + obese,   Peds negative pediatric ROS (+)  Hematology  (+) HIV,   Anesthesia Other Findings   Reproductive/Obstetrics negative OB ROS                             Anesthesia Physical Anesthesia Plan  ASA: III  Anesthesia Plan: General   Post-op Pain Management:    Induction: Intravenous  Airway Management Planned: LMA  Additional Equipment:   Intra-op Plan:   Post-operative Plan: Extubation in OR  Informed Consent: I have reviewed the patients History and Physical, chart, labs and discussed the procedure including the risks, benefits and alternatives for the proposed anesthesia with the patient or authorized representative who has indicated his/her understanding and acceptance.   Dental advisory given  Plan Discussed with: CRNA  Anesthesia Plan Comments:         Anesthesia Quick Evaluation

## 2016-02-26 ENCOUNTER — Encounter (HOSPITAL_BASED_OUTPATIENT_CLINIC_OR_DEPARTMENT_OTHER): Payer: Self-pay | Admitting: Orthopedic Surgery

## 2016-02-26 NOTE — Addendum Note (Signed)
Addendum  created 02/26/16 1137 by Maryella Shivers, CRNA   Charge Capture section accepted, Visit diagnoses modified

## 2016-02-29 NOTE — Telephone Encounter (Signed)
Call to the pharmacy- refill granted

## 2016-03-04 ENCOUNTER — Other Ambulatory Visit: Payer: Self-pay | Admitting: Infectious Disease

## 2016-03-22 ENCOUNTER — Other Ambulatory Visit: Payer: Self-pay | Admitting: Internal Medicine

## 2016-03-22 DIAGNOSIS — G894 Chronic pain syndrome: Secondary | ICD-10-CM

## 2016-03-22 DIAGNOSIS — F419 Anxiety disorder, unspecified: Secondary | ICD-10-CM

## 2016-03-22 MED ORDER — OXYCODONE-ACETAMINOPHEN 5-325 MG PO TABS: 1.0000 | ORAL_TABLET | Freq: Four times a day (QID) | ORAL | 0 refills | Status: DC | PRN

## 2016-03-22 NOTE — Telephone Encounter (Signed)
Last rx written 12/16/15. Last office visit 02/02/16. Next appt 05/05/16. Urine 01/28/15.  States her sister Krystal Clark will pick up rx when ready.

## 2016-03-22 NOTE — Telephone Encounter (Signed)
Pain med refill °

## 2016-03-22 NOTE — Telephone Encounter (Signed)
Pt is post-op from Orthopaedic surgery, but has chronic pain disorder previously controlled on Percocet. She has not provided a UDS at her last visit and has been resistant to filling Narcan Rx in the past. I would like to meet the patient and obtain UDS with Narcan counseling prior to refill. I wiould also like to f/u after her surgery, in order to assess her pain level with this issue definitely resolved. I will write a short script until Thursday, but please schedule the patient in my CC for 9/21.  Thanks, Gaspar Bidding

## 2016-03-23 NOTE — Telephone Encounter (Addendum)
Call pt - states she has a stomach virus; having diarrhea/vomiting- not feeling well - unable to come to appt  tomorrow 9/21. Suggest coming in since she does not feel well - but states it's a virus which her mother had; and "just has to ride it out". States she can come next week but you no available appts until Nov - she already has an appt Nov 2.  And inform of Percocet rx for 8 tabs - [place] not [place] never receive rx). Also inform her Lorazepam refill denial. Please advise. Thanks

## 2016-03-25 NOTE — Telephone Encounter (Signed)
Pt called - no answer; left message to call me back or call and schedule appt for next week in [facility].

## 2016-03-25 NOTE — Telephone Encounter (Signed)
Called pt again no answer left message

## 2016-03-30 ENCOUNTER — Telehealth: Payer: Self-pay | Admitting: Neurology

## 2016-03-30 NOTE — Telephone Encounter (Signed)
Patient called to schedule BOTOX appointment. Please call [phone].

## 2016-04-08 ENCOUNTER — Other Ambulatory Visit: Payer: Self-pay | Admitting: Infectious Disease

## 2016-04-12 NOTE — Telephone Encounter (Signed)
Message sent to [facility] about BOtox appt.

## 2016-04-12 NOTE — Telephone Encounter (Signed)
Pt called to make appt for Botox. Her migraines are getting worse. Please call [phone]

## 2016-04-13 NOTE — Telephone Encounter (Signed)
LVM for pt to call to schedule botox appt

## 2016-04-21 ENCOUNTER — Other Ambulatory Visit: Payer: Self-pay | Admitting: Obstetrics & Gynecology

## 2016-04-21 DIAGNOSIS — Z1231 Encounter for screening mammogram for malignant neoplasm of breast: Secondary | ICD-10-CM

## 2016-04-21 NOTE — Telephone Encounter (Signed)
Patient returned call regarding scheduling BOTOX, please call [phone].

## 2016-04-25 ENCOUNTER — Ambulatory Visit: Payer: Medicaid Other

## 2016-04-25 NOTE — Telephone Encounter (Signed)
Spoke with patient and scheduled apt.

## 2016-04-25 NOTE — Telephone Encounter (Signed)
Patient called regarding scheduling BOTOX, states this is her 4th call. Please call [phone].

## 2016-04-26 ENCOUNTER — Ambulatory Visit (INDEPENDENT_AMBULATORY_CARE_PROVIDER_SITE_OTHER): Payer: Medicaid Other

## 2016-04-26 ENCOUNTER — Other Ambulatory Visit: Payer: Self-pay | Admitting: Sports Medicine

## 2016-04-26 DIAGNOSIS — Z23 Encounter for immunization: Secondary | ICD-10-CM

## 2016-04-27 ENCOUNTER — Encounter: Payer: Self-pay | Admitting: Neurology

## 2016-04-27 ENCOUNTER — Ambulatory Visit (INDEPENDENT_AMBULATORY_CARE_PROVIDER_SITE_OTHER): Payer: Medicaid Other | Admitting: Neurology

## 2016-04-27 VITALS — BP 137/84 | HR 106 | Ht 66.0 in | Wt 288.0 lb

## 2016-04-27 DIAGNOSIS — G43719 Chronic migraine without aura, intractable, without status migrainosus: Secondary | ICD-10-CM | POA: Diagnosis not present

## 2016-04-27 NOTE — Progress Notes (Signed)
PATIENT: [name] DOB: 08-Feb-1966  Chief Complaint  Patient presents with  . Migraine    Botox 200 units - specialty pharmacy     HISTORICAL  [name], Is accompanied by her sister returned for Botox injection as migraine prevention  She has past medical history of HIV, hyperlipidemia, migraines, depression, and non-Hodgkin lymphoma in 2005 with chemotherapy and radiation.   She has history of migraines since 19. In the last 10 years, she reports having daily migraines. She is currently taking Topamax and Zomig with relief. She takes approximately 20-25 tablets of her Zomig per month.    She has aura's that look like floating dirt particles, flashing lights, and she sees Christmas tree lights when they are severe, she has nausea, vomiting and sometimes diarrhea when they're severe. She also complains of diplopia, blurry vision, and  photophobia. She also has had a lightning bolt sensation throughout her body.  She describes them as pulsating and throbbing  on a scale of 5-6/10 which she considers mild, when they are severe they can go as high as 10/10. They usually start in the right temporal area, but will also start on the left temporal and are worse.   Any type of smoke or MSG can precipitate a migraine. They generally last 2-9 days. She has been to the emergency department twice in the last year for migraines. She states "I just can't do this anymore I need to do something about her migraines"  She has tried and failed different preventive medications:  atenolol, verapamil, nortriptyline, Inderal, Depakote without benefit. Atenolol and verapamil caused hypotension. Depakote caused severe vomiting. Topamax causing her worsening headaches, will show tried Zonegran, Topamax ER, she denies significant improvement.  For abortive treatment, she has tried Imitrex, Maxalt with suboptimal response, Zomig as needed since to work best,  she also takes transderm scophalamine patch for  nause  I started to give her BOTOX injection since May 2013, every 3 months, she responded very well,   Previous MRI brain was normal in 2013.  Last Botox injection as migraine prevention was March 2017, she responded very well, but in recent few weeks, she noticed increased migraine headaches, this baby had headache left retro-orbital area severe pounding headache with associated light noise sensitivity, has been ongoing for 3 days, failed to respond to Zomig by mouth, Imitrex injection as needed, she has constant dry heaves, difficulty opening her eyes, In addition, today she complains of restless leg symptoms, difficult to stand or lying still for extended period of time, has tried Neurontin-cause nausea, confusion,  Lyrica-cause excessive weight gain, she has never tried dopamine agonist in the past  I reviewed laboratory in April 2017, normal hemoglobin 12 point 9, mild elevated RDW 15.8, decreased MCV   Update April 27 2016  Last Botox injection was in June 2017, she had left knee arthroscopic surgery in August 2017, complaining of significant joints pain, unsteady gait, before Botox she was having moderate to severe headache daily, even Botox now, she used to 12 tablets of Zomig every months because of frequent moderate to severe headache   REVIEW OF SYSTEMS: Full 14 system review of systems performed and notable only for as above  ALLERGIES: Allergies  Allergen Reactions  . Diazepam     Triggers migraines  . Divalproex Sodium Nausea And Vomiting    Triggers migraines  . Gabapentin Nausea And Vomiting    confusion  . Ondansetron Hcl Nausea And Vomiting    Triggers  migraines, "makes me vomit"  . Penicillins     REACTION: lips swollen to point of bleeding, sever vaginal irritation  Reports tolerating augmentin well without any complaints or reaction  . Propoxyphene N-Acetaminophen Nausea And Vomiting  . Simvastatin     PATIENT CANNOT BE PRESCRIBED THIS STATIN WHILE  RECEIVING NORVIR  . Topamax [Topiramate]     Migraines   . Tramadol Hcl Itching    REACTION: migraines and itching--hx of migraines even without medication   . Zocor [Simvastatin - High Dose]     HOME MEDICATIONS: Current Outpatient Prescriptions  Medication Sig Dispense Refill  . Beclomethasone Dipropionate 80 MCG/ACT AERS Place 2 Inhalers into the nose daily. 8.7 g 10  . botulinum toxin Type A (BOTOX) 100 UNITS SOLR injection INJECT 200 UNITS IM INTO  HEAD NECK EVERY 3 MONTHS    . Calcium Carbonate-Vitamin D (CALCIUM-VITAMIN D) 500-200 MG-UNIT per tablet Take 2 tablets by mouth daily.     . DESCOVY 200-25 MG tablet TAKE 1 TABLET BY MOUTH EVERY DAY 30 tablet 0  . diclofenac sodium (VOLTAREN) 1 % GEL Apply 2 g topically 4 (four) times daily. 100 g 2  . DULERA 100-5 MCG/ACT AERO INHALE 2 PUFFS FIRST THING IN THE MORNING AND THEN ANOTHER 2 PUFFS ABOUT 12 HOURS LATER 13 g 11  . enoxaparin (LOVENOX) 40 MG/0.4ML injection Inject 0.4 mLs (40 mg total) into the skin daily. 21 Syringe 0  . fluconazole (DIFLUCAN) 100 MG tablet Take 1 tablet (100 mg total) by mouth daily. 14 tablet 4  . hydrocortisone cream 1 % APPLY TO AFFECTED AREA TWICE A DAY 28 g 2  . HYDROmorphone (DILAUDID) 2 MG tablet Take 1 tablet (2 mg total) by mouth every 4 (four) hours as needed for severe pain. 50 tablet 0  . LORazepam (ATIVAN) 0.5 MG tablet Take 1 tablet (0.5 mg total) by mouth daily as needed for anxiety. 30 tablet 0  . NEXIUM 40 MG capsule TAKE 1 CAPSULE BY MOUTH 2 TIMES DAILY BEFORE A MEAL 60 capsule 0  . oxyCODONE (ROXICODONE) 5 MG immediate release tablet Take 1-2 tablets (5-10 mg total) by mouth every 4 (four) hours as needed for severe pain. 50 tablet 0  . oxyCODONE-acetaminophen (PERCOCET/ROXICET) 5-325 MG tablet Take 1 tablet by mouth every 6 (six) hours as needed for severe pain. 8 tablet 0  . pramipexole (MIRAPEX) 0.25 MG tablet Take 2 tablets (0.5 mg total) by mouth at bedtime. 60 tablet 11  . PREZCOBIX  800-150 MG tablet TAKE 1 TABLET BY MOUTH EVERY DAY 30 tablet 5  . PROAIR HFA 108 (90 Base) MCG/ACT inhaler USE 1 TO 2 PUFFS EVERY 4 TO 6 HOURS AS NEEDED SHORTNESS OF BREATH OR WHEEZING 8.5 g 11  . prochlorperazine (COMPAZINE) 10 MG tablet Take 1 tablet (10 mg total) by mouth every 6 (six) hours as needed for nausea or vomiting. 30 tablet 11  . scopolamine (TRANSDERM-SCOP, 1.5 MG,) 1 MG/3DAYS Place 1 patch (1.5 mg total) onto the skin every 3 (three) days. 10 patch 11  . Tamsulosin HCl (FLOMAX) 0.4 MG CAPS Take 0.4 mg by mouth daily. 1-2 daily    . VOLTAREN 1 % GEL APPLY 2 GRAMS 4 TIMES DAILY 100 g 2  . zolmitriptan (ZOMIG) 5 MG tablet Take 1 tablet (5 mg total) by mouth as needed for migraine. 15 tablet 11   No current facility-administered medications for this visit.     PAST MEDICAL HISTORY: Past Medical History:  Diagnosis Date  .  Acute gastritis without mention of hemorrhage 01/01/2002  . Anemia 10/2009   macrocytic anemia with baseline MCV 104-106  . Anxiety   . Asthma   . 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 Complex tear of medial meniscus of left knee as current injury 02/25/2016  . Complication of anesthesia    hard to wake up  . COPD (chronic obstructive pulmonary disease) (HCC)    chronic bronchitis  . Derangement of anterior horn of lateral meniscus of left knee 02/25/2016  . Duodenitis without mention of hemorrhage 01/01/2002  . DVT (deep venous thrombosis) (HCC)    history clot lt groin when she had lymphoma-in remision now.  . Gastroparesis   . GERD (gastroesophageal reflux disease)   . Hiatal hernia 01/01/2002  . HIV infection ([place])    CD4 = 570 (05/2010), VL undetectation  . Hyperlipidemia   . IBS (irritable bowel syndrome)   . Migraines    on topamax and triptans, frequent (almost daily) attacks   . Non Hodgkin's lymphoma ([place])    stage II, s/p resection, chemotherapy, radiotherapy, Dr. Beryle Beams is her oncologist.   . PUD (peptic ulcer disease) 07/1997   per  EGD report 07/1997 with history of esophagitis  . Restless leg syndrome 12/24/2015  . Shingles    in lumbar dermatome  . Spondylolisthesis of lumbar region    L5-S1  . TMJ (dislocation of temporomandibular joint)   . Traumatic tear of lateral meniscus of right knee 04/20/2012    PAST SURGICAL HISTORY: Past Surgical History:  Procedure Laterality Date  . CHOLECYSTECTOMY    . ENDOMETRIAL ABLATION    . KNEE ARTHROSCOPY Right 04/20/12  . KNEE ARTHROSCOPY WITH LATERAL MENISECTOMY Left 02/25/2016   Procedure: LEFT KNEE ARTHROSCOPY WITH MEDIAL AND  LATERAL MENISECTOMIES;  Surgeon: Marchia Bond, MD;  Location: [facility];  Service: Orthopedics;  Laterality: Left;  . KNEE ARTHROSCOPY WITH MEDIAL MENISECTOMY Left 02/25/2016   Procedure: KNEE ARTHROSCOPY WITH MEDIAL MENISECTOMY;  Surgeon: Marchia Bond, MD;  Location: [place];  Service: Orthopedics;  Laterality: Left;  . TONSILLECTOMY    . TYMPANOPLASTY Left   . TYMPANOSTOMY TUBE PLACEMENT     as child-both  . UPPER GASTROINTESTINAL ENDOSCOPY  2003, 2007   done by Dr. Velora Heckler    FAMILY HISTORY: Family History  Problem Relation Age of Onset  . Hypertension Mother   . Stroke Mother   . Heart disease Mother   . Breast cancer Paternal Grandmother   . Kidney cancer Maternal Uncle   . Heart disease Maternal Uncle   . Diabetes Maternal Uncle   . Diabetes Maternal Grandmother   . Heart disease Maternal Grandmother   . Emphysema Paternal Uncle     never smoker  . Emphysema Paternal Grandfather     never smoker  . Colon cancer Paternal Aunt     with met to lung   . Non-Hodgkin's lymphoma Maternal Uncle   . Heart disease Maternal Grandfather     SOCIAL HISTORY:  Social History   Social History  . Marital status: Divorced    Spouse name: N/A  . Number of children: 0  . Years of education: 12   Occupational History  . disabled Unemployed   Social History Main Topics  . Smoking status: Never  Smoker  . Smokeless tobacco: Never Used  . Alcohol use No  . Drug use: No  . Sexual activity: Not on file   Other Topics Concern  . Not on file  Social History Narrative      Patient is disabled.   Right handed.   Caffeine - Diet soda., tea   Education - high school     PHYSICAL EXAM   Vitals:   04/27/16 1627  BP: 137/84  Pulse: (!) 106  Weight: 288 lb (130.6 kg)  Height: 5' 6" (1.676 m)    Not recorded      Body mass index is 46.48 kg/m.  PHYSICAL EXAMNIATION:  Gen: NAD, conversant, well nourised, obese, well groomed                     Cardiovascular: Regular rate rhythm, no peripheral edema, warm, nontender. Eyes: Conjunctivae clear without exudates or hemorrhage Neck: Supple, no carotid bruise. Pulmonary: Clear to auscultation bilaterally   NEUROLOGICAL EXAM:  MENTAL STATUS: Speech:    Speech is normal; fluent and spontaneous with normal comprehension.  Cognition:     Orientation to time, place and person     Normal recent and remote memory     Normal Attention span and concentration     Normal Language, naming, repeating,spontaneous speech     Fund of knowledge   CRANIAL NERVES: CN II: Visual fields are full to confrontation. Fundoscopic exam is normal with sharp discs and no vascular changes. Pupils are round equal and briskly reactive to light. CN III, IV, VI: extraocular movement are normal. No ptosis. CN V: Facial sensation is intact to pinprick in all 3 divisions bilaterally. Corneal responses are intact.  CN VII: Face is symmetric with normal eye closure and smile. CN VIII: Hearing is normal to rubbing fingers CN IX, X: Palate elevates symmetrically. Phonation is normal. CN XI: Head turning and shoulder shrug are intact CN XII: Tongue is midline with normal movements and no atrophy.  MOTOR: There is no pronator drift of out-stretched arms. Muscle bulk and tone are normal. Muscle strength is normal.  REFLEXES: Reflexes are 2+ and  symmetric at the biceps, triceps, knees, and ankles. Plantar responses are flexor.  SENSORY: Intact to light touch, pinprick, positional sensation and vibratory sensation are intact in fingers and toes.  COORDINATION: Rapid alternating movements and fine finger movements are intact. There is no dysmetria on finger-to-nose and heel-knee-shin.    GAIT/STANCE: Posture is normal. Gait is steady with normal steps, base, arm swing, and turning. Heel and toe walking are normal. Tandem gait is normal.  Romberg is absent.  DIAGNOSTIC DATA (LABS, IMAGING, TESTING) - I reviewed patient records, labs, notes, testing and imaging myself where available.   ASSESSMENT AND PLAN  [name] is a 50 y.o. female    History of HIV, hypercholesterolemia, Non hodgkin' lymphoma with chemotherapy and radiation and migraines since age 83.   She has tried and failed multiple preventive medications including atenolol, verapamil, nortriptyline, and Depakote,Topamax, Topamax ER,   She has migraines daily for more than  10 years. She is currently taking  zonogram  and Zomig with some relief. Previously tried imitrex and Maxalt with suboptimal response,  Normal MRI brain, and neurological exam.  BOTOX injection was performed according to protocol by [employer]. 100 units of BOTOX was dissolved into 2 cc NS  Used total 200 units  Corrugator 2 sites, 10 units Procerus 1 site, 5 unit Frontalis 4 sites,  20 units, Temporalis 8 sites,  40 units  Occipitalis 6 sites, 30 units Cervical Paraspinal, 4 sites, 20 units Trapezius, 6 sites, 30 units  Extra 45 units were injected along bilateral cervical paraspinal  muscles and bilateral skull,  Restless leg symptoms   Add on Mirapex 0.2 5 mg, may take up to 2 tablets every night  Iron panel including ferritin at next visit  Severe migraine headache over the past few days  She has tried and failed Zomig, Imitrex injection,  I have given her Compazine 10 mg, Benadryl 25  mg IV at office today, her headache has much improved afterwards, previous Toradol injection made her headache worse   Marcial Pacas, M.D. Ph.D.  [facility] [address] [place], [place] [address] Ph: [phone] Fax: [phone]  CC: Referring Provider

## 2016-04-27 NOTE — Progress Notes (Signed)
**  Botox 100 units x 2 vials, Lot [id number], Exp 10/2018, NDC [id number], specialty pharmacy.//mck,rn**

## 2016-05-04 ENCOUNTER — Telehealth: Payer: Self-pay | Admitting: Internal Medicine

## 2016-05-04 NOTE — Telephone Encounter (Signed)
APT. REMINDER CALL, LMTCB °

## 2016-05-05 ENCOUNTER — Encounter: Payer: Medicaid Other | Admitting: Internal Medicine

## 2016-05-05 ENCOUNTER — Other Ambulatory Visit: Payer: Self-pay | Admitting: Infectious Disease

## 2016-05-05 ENCOUNTER — Other Ambulatory Visit: Payer: Self-pay | Admitting: *Deleted

## 2016-05-05 DIAGNOSIS — B20 Human immunodeficiency virus [HIV] disease: Secondary | ICD-10-CM

## 2016-05-05 MED ORDER — EMTRICITABINE-TENOFOVIR AF 200-25 MG PO TABS: 1.0000 | ORAL_TABLET | Freq: Every day | ORAL | 1 refills | Status: DC

## 2016-05-25 ENCOUNTER — Ambulatory Visit: Payer: Medicaid Other

## 2016-05-25 ENCOUNTER — Telehealth: Payer: Self-pay | Admitting: *Deleted

## 2016-05-25 ENCOUNTER — Other Ambulatory Visit: Payer: Medicaid Other

## 2016-05-25 NOTE — Telephone Encounter (Signed)
Lab appt was previously rescheduled for 06/22/16.  Does not need to come 05/25/16 for lab work.  Message left with this information.

## 2016-05-30 ENCOUNTER — Encounter: Payer: Self-pay | Admitting: Internal Medicine

## 2016-05-30 ENCOUNTER — Ambulatory Visit (INDEPENDENT_AMBULATORY_CARE_PROVIDER_SITE_OTHER): Payer: Medicaid Other | Admitting: Internal Medicine

## 2016-05-30 DIAGNOSIS — M545 Low back pain: Secondary | ICD-10-CM

## 2016-05-30 DIAGNOSIS — Z79891 Long term (current) use of opiate analgesic: Secondary | ICD-10-CM

## 2016-05-30 DIAGNOSIS — F419 Anxiety disorder, unspecified: Secondary | ICD-10-CM

## 2016-05-30 DIAGNOSIS — M25561 Pain in right knee: Secondary | ICD-10-CM

## 2016-05-30 DIAGNOSIS — M25562 Pain in left knee: Secondary | ICD-10-CM | POA: Diagnosis not present

## 2016-05-30 DIAGNOSIS — G894 Chronic pain syndrome: Secondary | ICD-10-CM | POA: Diagnosis present

## 2016-05-30 MED ORDER — OXYCODONE-ACETAMINOPHEN 5-325 MG PO TABS: 1.0000 | ORAL_TABLET | Freq: Four times a day (QID) | ORAL | 0 refills | Status: DC | PRN

## 2016-05-30 MED ORDER — FLUOXETINE HCL 40 MG PO CAPS: 40.0000 mg | ORAL_CAPSULE | Freq: Every day | ORAL | 3 refills | Status: DC

## 2016-05-30 MED ORDER — LORAZEPAM 0.5 MG PO TABS: 0.5000 mg | ORAL_TABLET | Freq: Every day | ORAL | 0 refills | Status: DC | PRN

## 2016-05-30 MED ORDER — DICLOFENAC SODIUM 1 % TD GEL: 4.0000 g | Freq: Four times a day (QID) | TRANSDERMAL | 2 refills | Status: DC

## 2016-05-30 NOTE — Assessment & Plan Note (Signed)
We had a long discussion about managing anxiety and she has agreed to restart her Prozac. Will start Prozac 40 mg daily. We also discussed that Ativan should not be used long-term to manage anxiety and that I understood that this has been done in the past with her. We discussed that once the Prozac starts to take effect that we can start to taper off the Ativan. Patient seemed hesitant about this but I advised with her concomitant opioid use that this was in her best interest.

## 2016-05-30 NOTE — Assessment & Plan Note (Addendum)
Patient with longstanding history of low back pain and right knee pain on chronic opioids. She now has left-sided knee pain related to meniscus tears and is post surgical repair since August this year. Per review of her imaging, she had an MRI lumbar/thoracic/cervical spine which essentially was unremarkable except for very mild narrowing at C4-C5 and L5-S1. She did have significant meniscal tears of both knees but has undergone surgery for both knees and is now 3 months post-op for her left knee.   There are several red flags that concern me. One, she refused to give a urine sample at her last clinic visit in June. Next, she essentially went 2 months without any opioid prescriptions despite having "10/10" pain. She reports she completed her last Oxycodone prescription last week but this was filled on 9/8 and she was prescribed #50 tablets. Given she reports having to take Percocet every 6 hours to deal with her pain I doubt she would have just finished this prescription last week. Additionally, she is still complaining of significant left knee pain which seems unusual being 3 months post-op. She has no swelling or erythema to suggest infection as a cause for pain.   I decided to not get a UDS today as she has not been on a consistent dose of pain medication and her last Percocet dose was in September. I think the best course of action is to get her on a stable dose of Percocet and then have discussions at her next few visits with her PCP about tapering down. I did discuss with her about using alternative medications such as ibuprofen, voltaren gel, and tylenol so that she did not have to take Percocet around the clock. She will need to have her contract renewed and random UDS at her next PCP visit and focus on achievable goals. We discussed that the goal of pain management is to make the pain tolerable, not necessarily disappear completely. I think it will be very important for her future care to hone down  where her pain is and the functional goals she wishes to achieve.  I gave her one prescription for Percocet 5-325 mg Q6H PRN #120 tablets. She will follow up in 1 month to reassess pain control. If any red flags, would not hesitate to decrease the amount of pills she gets. She has PCP appt on 1/25.
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
## 2016-05-30 NOTE — Patient Instructions (Addendum)
General Instructions: - Restart Prozac 40 mg daily - Only use Ativan as needed. Hopefully we can get you off of this medication gradually once the Prozac starts working - Use Percocet only when pain is severe. It does not need to be taken every 6 hours, only as needed - Can also use ibuprofen, voltaren gel, tylenol to lower your Percocet use  Please bring your medicines with you each time you come to clinic.  Medicines may include prescription medications, over-the-counter medications, herbal remedies, eye drops, vitamins, or other pills.   Progress Toward Treatment Goals:  Treatment Goal 08/06/2013  Prevent falls at goal    Self Care Goals & Plans:  Self Care Goal 01/28/2015  Manage my medications take my medicines as prescribed; bring my medications to every visit; refill my medications on time  Eat healthy foods drink diet soda or water instead of juice or soda; eat more vegetables; eat foods that are low in salt; eat baked foods instead of fried foods; eat fruit for snacks and desserts    No flowsheet data found.   Care Management & Community Referrals:  No flowsheet data found.

## 2016-05-31 ENCOUNTER — Telehealth: Payer: Self-pay | Admitting: *Deleted

## 2016-05-31 NOTE — Telephone Encounter (Signed)
PA request submitted online via [employer] [employer]-request approved, pharmacy aware and will let pt know.Despina Hidden Cassady11/28/20174:38 PM        [id number] W  PHARMACY TO:[id number] [place] 05/31/2016  APPROVED 05/31/2016 - 11/27/2016 DMA

## 2016-06-01 ENCOUNTER — Other Ambulatory Visit: Payer: Medicaid Other

## 2016-06-01 NOTE — Progress Notes (Signed)
Internal Medicine Clinic Attending  Case discussed with Dr. Rivet at the time of the visit.  We reviewed the resident's history and exam and pertinent patient test results.  I agree with the assessment, diagnosis, and plan of care documented in the resident's note.  

## 2016-06-06 ENCOUNTER — Other Ambulatory Visit (HOSPITAL_COMMUNITY)
Admission: RE | Admit: 2016-06-06 | Discharge: 2016-06-06 | Disposition: A | Discharge status: Home / Self Care | Payer: Medicaid Other | Source: Ambulatory Visit | Attending: Obstetrics and Gynecology | Admitting: Obstetrics and Gynecology

## 2016-06-06 ENCOUNTER — Encounter: Payer: Self-pay | Admitting: Obstetrics and Gynecology

## 2016-06-06 ENCOUNTER — Ambulatory Visit (INDEPENDENT_AMBULATORY_CARE_PROVIDER_SITE_OTHER): Payer: Medicaid Other | Admitting: Obstetrics and Gynecology

## 2016-06-06 VITALS — BP 128/84 | HR 93 | Wt 286.0 lb

## 2016-06-06 DIAGNOSIS — Z01419 Encounter for gynecological examination (general) (routine) without abnormal findings: Secondary | ICD-10-CM

## 2016-06-06 DIAGNOSIS — Z1151 Encounter for screening for human papillomavirus (HPV): Secondary | ICD-10-CM | POA: Insufficient documentation

## 2016-06-06 MED ORDER — BORIC ACID POWD: 6 refills | Status: DC

## 2016-06-06 MED ORDER — FLUCONAZOLE 100 MG PO TABS: 100.0000 mg | ORAL_TABLET | Freq: Every day | ORAL | 4 refills | Status: DC

## 2016-06-06 NOTE — Progress Notes (Signed)
Subjective:     [name] is a 50 y.o. female  and is here for a comprehensive physical exam. The patient reports no problems. She is sexually active without complaints. She denies any vaginal bleeding since her endometrial ablation in 2007. She denies any pelvic pain or abnormal discharge.  Past Medical History:  Diagnosis Date  . Acute gastritis without mention of hemorrhage 01/01/2002  . Anemia 10/2009   macrocytic anemia with baseline MCV 104-106  . Anxiety   . Asthma   . 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 Complex tear of medial meniscus of left knee as current injury 02/25/2016  . Complication of anesthesia    hard to wake up  . COPD (chronic obstructive pulmonary disease) (HCC)    chronic bronchitis  . Derangement of anterior horn of lateral meniscus of left knee 02/25/2016  . Duodenitis without mention of hemorrhage 01/01/2002  . DVT (deep venous thrombosis) (HCC)    history clot lt groin when she had lymphoma-in remision now.  . Gastroparesis   . GERD (gastroesophageal reflux disease)   . Hiatal hernia 01/01/2002  . HIV infection ([place])    CD4 = 570 (05/2010), VL undetectation  . Hyperlipidemia   . IBS (irritable bowel syndrome)   . Migraines    on topamax and triptans, frequent (almost daily) attacks   . 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 Beryle Beams is her oncologist.   . PUD (peptic ulcer disease) 07/1997   per EGD report 07/1997 with history of esophagitis  . Restless leg syndrome 12/24/2015  . Shingles    in lumbar dermatome  . Spondylolisthesis of lumbar region    L5-S1  . TMJ (dislocation of temporomandibular joint)   . Traumatic tear of lateral meniscus of right knee 04/20/2012   Past Surgical History:  Procedure Laterality Date  . CHOLECYSTECTOMY    . ENDOMETRIAL ABLATION    . KNEE ARTHROSCOPY Right 04/20/12  . KNEE ARTHROSCOPY WITH LATERAL MENISECTOMY Left 02/25/2016   Procedure: LEFT KNEE  ARTHROSCOPY WITH MEDIAL AND  LATERAL MENISECTOMIES;  Surgeon: Marchia Bond, MD;  Location: [facility];  Service: Orthopedics;  Laterality: Left;  . KNEE ARTHROSCOPY WITH MEDIAL MENISECTOMY Left 02/25/2016   Procedure: KNEE ARTHROSCOPY WITH MEDIAL MENISECTOMY;  Surgeon: Marchia Bond, MD;  Location: [facility];  Service: Orthopedics;  Laterality: Left;  . TONSILLECTOMY    . TYMPANOPLASTY Left   . TYMPANOSTOMY TUBE PLACEMENT     as child-both  . UPPER GASTROINTESTINAL ENDOSCOPY  2003, 2007   done by Dr. Velora Heckler   Family History  Problem Relation Age of Onset  . Hypertension Mother   . Stroke Mother   . Heart disease Mother   . Breast cancer Paternal Grandmother   . Kidney cancer Maternal Uncle   . Heart disease Maternal Uncle   . Diabetes Maternal Uncle   . Diabetes Maternal Grandmother   . Heart disease Maternal Grandmother   . Emphysema Paternal Uncle     never smoker  . Emphysema Paternal Grandfather     never smoker  . Colon cancer Paternal Aunt     with met to lung   . Non-Hodgkin's lymphoma Maternal Uncle   . Heart disease Maternal Grandfather     Social History   Social History  . Marital status: Divorced    Spouse name: N/A  . Number of children: 0  . Years of education: 12   Occupational History  .  disabled Unemployed   Social History Main Topics  . Smoking status: Never Smoker  . Smokeless tobacco: Never Used  . Alcohol use No  . Drug use: No  . Sexual activity: Not Currently    Partners: Male    Birth control/ protection: Surgical   Other Topics Concern  . Not on file   Social History Narrative      Patient is disabled.   Right handed.   Caffeine - Diet soda., tea   Education - high school   Health Maintenance  Topic Date Due  . LIPID PANEL  10/05/2016  . PAP SMEAR  06/12/2017  . TETANUS/TDAP  02/04/2020  . PNEUMOCOCCAL POLYSACCHARIDE VACCINE  Completed  . INFLUENZA VACCINE  Completed  . HIV Screening   Completed       Review of Systems Pertinent items are noted in HPI.   Objective:  Blood pressure 128/84, pulse 93, weight 286 lb (129.7 kg).     GENERAL: Well-developed, well-nourished female in no acute distress.  HEENT: Normocephalic, atraumatic. Sclerae anicteric.  NECK: Supple. Normal thyroid.  LUNGS: Clear to auscultation bilaterally.  HEART: Regular rate and rhythm. BREASTS: Symmetric in size. No palpable masses or lymphadenopathy, skin changes, or nipple drainage. ABDOMEN: Soft, nontender, nondistended. No organomegaly. PELVIC: Normal external female genitalia. Vagina is pink and rugated.  Normal discharge. Normal appearing cervix. Uterus is normal in size. No adnexal mass or tenderness. EXTREMITIES: No cyanosis, clubbing, or edema, 2+ distal pulses.    Assessment:    Healthy female exam.      Plan:    pap smear collected Screening mammogram already scheduled by the patient Patient advised to perform monthly self breast and vulva exam Patient will be contacted with any abnormal results See After Visit Summary for Counseling Recommendations

## 2016-06-06 NOTE — Progress Notes (Signed)
Patient needs pap, breast check, and she declines STD- no need.

## 2016-06-07 LAB — TSH: TSH: 4.27 u[IU]/mL (ref 0.450–4.500)

## 2016-06-07 LAB — FOLLICLE STIMULATING HORMONE: FSH: 54.5 m[IU]/mL

## 2016-06-07 LAB — LUTEINIZING HORMONE: LH: 33.9 m[IU]/mL

## 2016-06-08 ENCOUNTER — Ambulatory Visit: Payer: Medicaid Other | Admitting: Infectious Disease

## 2016-06-09 LAB — CYTOLOGY - PAP
Diagnosis: NEGATIVE
HPV (WINDOPATH): NOT DETECTED

## 2016-06-10 ENCOUNTER — Telehealth: Payer: Self-pay

## 2016-06-10 NOTE — Telephone Encounter (Signed)
Contacted patient to advise of results, no answer, left vm to call.

## 2016-06-12 LAB — NUSWAB VG, CANDIDA 6SP
Candida albicans, NAA: NEGATIVE
Candida glabrata, NAA: POSITIVE — AB
Candida krusei, NAA: NEGATIVE
Candida lusitaniae, NAA: NEGATIVE
Candida parapsilosis, NAA: NEGATIVE
Candida tropicalis, NAA: NEGATIVE
Trich vag by NAA: NEGATIVE

## 2016-06-13 ENCOUNTER — Telehealth: Payer: Self-pay | Admitting: Internal Medicine

## 2016-06-13 ENCOUNTER — Telehealth: Payer: Self-pay | Admitting: *Deleted

## 2016-06-13 NOTE — Telephone Encounter (Signed)
Pt needs PA on oxycodone

## 2016-06-13 NOTE — Telephone Encounter (Signed)
Pt requesting a refill  oxyCODONE-acetaminophen (PERCOCET/ROXICET) 5-325 MG tablet  Pt also states she will need a Prior Authorization to get her medication

## 2016-06-14 NOTE — Telephone Encounter (Signed)
I am very hesitant to refill narcotics on this pt whom I have not seen and has several red flags throughout her chart review. Per Dr. Shela Commons last note, she was to attempt to wean her use of opiods supplementing with alternative pain control therapies. It appears she has not done this. Her next appointment with me is 1/25. Do you have any recommendation?

## 2016-06-15 ENCOUNTER — Ambulatory Visit
Admission: RE | Admit: 2016-06-15 | Discharge: 2016-06-15 | Disposition: A | Discharge status: Home / Self Care | Payer: Medicaid Other | Source: Ambulatory Visit | Attending: Obstetrics & Gynecology | Admitting: Obstetrics & Gynecology

## 2016-06-15 DIAGNOSIS — Z1231 Encounter for screening mammogram for malignant neoplasm of breast: Secondary | ICD-10-CM

## 2016-06-15 NOTE — Telephone Encounter (Signed)
Thanks for the input. If the patient is unable to make it until her next appt w/ me in January. Please schedule her for the [facility].

## 2016-06-15 NOTE — Telephone Encounter (Signed)
I agree with Dr. Heber Hopewell, I would not prescribe oxycodone to this patient without seeing her in the office first. As outlined in my note she has several red flags and I think her asking for a refill already is another red flag. I would tell her she cannot get a prescription without an appointment. You should definitely obtain a UDS next visit. She essentially went 2 months without opioids so this questions if she truly needs this medication. If this were my patient I would consider stopping completely and using alternative non-opioid pain medications, but you should evaluate her and make that choice. Hope this helps.

## 2016-06-15 NOTE — Telephone Encounter (Signed)
She was instructed to take 1 pill every 6 hours thus the 120 pills should have lasted till her next appointment.  She does have some concerning features about her narcotic use as documented by Dr Arcelia Jew.  I do not think that I would provide her a new prescription outside of an office visit.  If she cannot make it to your next visit I would probably have her into [facility] to better document her use and to only prescribe her enough pills to make it to a visit with her PCP Gay Filler).  I will also add Dr Arcelia Jew to the tread incase she has any other insight

## 2016-06-16 ENCOUNTER — Other Ambulatory Visit: Payer: Self-pay | Admitting: Infectious Disease

## 2016-06-16 NOTE — Telephone Encounter (Signed)
Have left 2 messages for pt, will await her call

## 2016-06-17 ENCOUNTER — Telehealth: Payer: Self-pay | Admitting: *Deleted

## 2016-06-17 NOTE — Telephone Encounter (Signed)
LM on VM to CB re: lab results.

## 2016-06-17 NOTE — Telephone Encounter (Signed)
-----   Message from Mora Bellman, MD sent at 06/07/2016 12:12 PM EST ----- Please inform patient of hormone levels in the postmenopausal range Her thyroid level was normal  Peggy

## 2016-06-20 ENCOUNTER — Telehealth: Payer: Self-pay | Admitting: Neurology

## 2016-06-20 NOTE — Telephone Encounter (Signed)
PT NEEDS TO R/S BOTOX 1/31 APPT DUE TO DR YAN BEING OUT OF THE OFFICE.  CB

## 2016-06-22 ENCOUNTER — Other Ambulatory Visit: Payer: Medicaid Other

## 2016-06-23 ENCOUNTER — Encounter: Payer: Self-pay | Admitting: Neurology

## 2016-06-23 NOTE — Telephone Encounter (Signed)
Called and left patient a VM asking her to call me back.

## 2016-06-24 ENCOUNTER — Other Ambulatory Visit: Payer: Self-pay

## 2016-06-24 DIAGNOSIS — G894 Chronic pain syndrome: Secondary | ICD-10-CM

## 2016-06-24 DIAGNOSIS — F419 Anxiety disorder, unspecified: Secondary | ICD-10-CM

## 2016-06-24 NOTE — Telephone Encounter (Signed)
LORazepam (ATIVAN) 0.5 MG tablet, oxyCODONE-acetaminophen (PERCOCET/ROXICET) 5-325 MG tablet, refill request.

## 2016-06-24 NOTE — Telephone Encounter (Signed)
Last visit: 05/30/2016 Last UDS: 01/28/2015 Next appointment: 07/28/2016 Last refill: Ativan 05/30/2016 Percocet 06/13/2016 64 tabs

## 2016-07-05 ENCOUNTER — Other Ambulatory Visit: Payer: Self-pay

## 2016-07-05 NOTE — Telephone Encounter (Addendum)
Returned pt's call - no answer; left message to give us a call back. 

## 2016-07-05 NOTE — Telephone Encounter (Signed)
Pt called - no answer; left message to call and schedule an appt w/Dr Gay Filler for refills on Percocet and Ativan.

## 2016-07-05 NOTE — Telephone Encounter (Signed)
Refills declined. Pt must be seen before refilling her Ativan or Opioids. Multiple red flags and pt should have had Rx from 11/27 which lasted until her PCP appt w/ me on 1/25. Please schedule in [facility] if she is unable to make it to her 1/25 appt.

## 2016-07-05 NOTE — Telephone Encounter (Signed)
Please call pt back.

## 2016-07-06 ENCOUNTER — Ambulatory Visit: Payer: Medicaid Other | Admitting: Infectious Disease

## 2016-07-12 ENCOUNTER — Telehealth: Payer: Self-pay | Admitting: Neurology

## 2016-07-12 NOTE — Telephone Encounter (Signed)
Called to reschedule patient for injection due to Dr. Krista Blue being out of the office. She did not answer so I left a VM asking her to call back. A letter was also mailed. I have scheduled her for February 7th. Please inform the patient of this change if she calls. If that date doesn't work I will be happy to reschedule her.

## 2016-07-14 ENCOUNTER — Encounter: Payer: Self-pay | Admitting: Internal Medicine

## 2016-07-18 ENCOUNTER — Other Ambulatory Visit: Payer: Medicaid Other

## 2016-07-18 DIAGNOSIS — B20 Human immunodeficiency virus [HIV] disease: Secondary | ICD-10-CM

## 2016-07-18 LAB — COMPREHENSIVE METABOLIC PANEL
ALT: 16 U/L (ref 6–29)
AST: 19 U/L (ref 10–35)
Albumin: 4.1 g/dL (ref 3.6–5.1)
Alkaline Phosphatase: 64 U/L (ref 33–115)
BUN: 15 mg/dL (ref 7–25)
CO2: 28 mmol/L (ref 20–31)
CREATININE: 0.75 mg/dL (ref 0.50–1.10)
Calcium: 9.4 mg/dL (ref 8.6–10.2)
Chloride: 101 mmol/L (ref 98–110)
GLUCOSE: 119 mg/dL — AB (ref 65–99)
POTASSIUM: 4.2 mmol/L (ref 3.5–5.3)
SODIUM: 138 mmol/L (ref 135–146)
TOTAL PROTEIN: 7.2 g/dL (ref 6.1–8.1)
Total Bilirubin: 0.3 mg/dL (ref 0.2–1.2)

## 2016-07-18 LAB — CBC
HEMATOCRIT: 37.7 % (ref 35.0–45.0)
Hemoglobin: 12.3 g/dL (ref 11.7–15.5)
MCH: 26.5 pg — AB (ref 27.0–33.0)
MCHC: 32.6 g/dL (ref 32.0–36.0)
MCV: 81.1 fL (ref 80.0–100.0)
MPV: 8.6 fL (ref 7.5–12.5)
PLATELETS: 204 10*3/uL (ref 140–400)
RBC: 4.65 MIL/uL (ref 3.80–5.10)
RDW: 16 % — AB (ref 11.0–15.0)
WBC: 5.2 10*3/uL (ref 3.8–10.8)

## 2016-07-19 LAB — T-HELPER CELL (CD4) - (RCID CLINIC ONLY)
CD4 T CELL ABS: 760 /uL (ref 400–2700)
CD4 T CELL HELPER: 35 % (ref 33–55)

## 2016-07-21 ENCOUNTER — Other Ambulatory Visit: Payer: Self-pay | Admitting: Infectious Disease

## 2016-07-21 ENCOUNTER — Other Ambulatory Visit: Payer: Self-pay | Admitting: Sports Medicine

## 2016-07-21 ENCOUNTER — Other Ambulatory Visit: Payer: Self-pay | Admitting: Neurology

## 2016-07-21 LAB — HIV-1 RNA QUANT-NO REFLEX-BLD
HIV 1 RNA Quant: <20 copies/mL (ref <20)
HIV-1 RNA Quant, Log: <1.3 Log copies/mL (ref <1.30)

## 2016-07-22 NOTE — Telephone Encounter (Signed)
You had refilled this medication last. Can you take this request?

## 2016-07-27 ENCOUNTER — Telehealth: Payer: Self-pay | Admitting: Neurology

## 2016-07-27 ENCOUNTER — Other Ambulatory Visit: Payer: Self-pay | Admitting: *Deleted

## 2016-07-27 ENCOUNTER — Other Ambulatory Visit: Payer: Self-pay | Admitting: Infectious Disease

## 2016-07-27 ENCOUNTER — Telehealth: Payer: Self-pay | Admitting: Internal Medicine

## 2016-07-27 MED ORDER — ZOLMITRIPTAN 5 MG PO TABS: ORAL_TABLET | ORAL | 11 refills | Status: DC

## 2016-07-27 NOTE — Telephone Encounter (Signed)
APT. REMINDER CALL, LMTCB °

## 2016-07-27 NOTE — Telephone Encounter (Signed)
Spoke to patient - refill sent to pharmacy.

## 2016-07-27 NOTE — Telephone Encounter (Signed)
Patient called in reference to her migraine medication refill and wanting to know why it was not authorized for a refill.  Please call

## 2016-07-28 ENCOUNTER — Encounter: Payer: Self-pay | Admitting: Internal Medicine

## 2016-07-28 ENCOUNTER — Ambulatory Visit (INDEPENDENT_AMBULATORY_CARE_PROVIDER_SITE_OTHER): Payer: Medicaid Other | Admitting: Internal Medicine

## 2016-07-28 VITALS — BP 142/75 | HR 88 | Temp 98.0°F | Wt 293.8 lb

## 2016-07-28 DIAGNOSIS — Z800 Family history of malignant neoplasm of digestive organs: Secondary | ICD-10-CM | POA: Diagnosis not present

## 2016-07-28 DIAGNOSIS — F1120 Opioid dependence, uncomplicated: Secondary | ICD-10-CM

## 2016-07-28 DIAGNOSIS — Z79899 Other long term (current) drug therapy: Secondary | ICD-10-CM

## 2016-07-28 DIAGNOSIS — G894 Chronic pain syndrome: Secondary | ICD-10-CM

## 2016-07-28 DIAGNOSIS — F419 Anxiety disorder, unspecified: Secondary | ICD-10-CM

## 2016-07-28 DIAGNOSIS — Z823 Family history of stroke: Secondary | ICD-10-CM | POA: Diagnosis not present

## 2016-07-28 DIAGNOSIS — G8929 Other chronic pain: Secondary | ICD-10-CM | POA: Diagnosis present

## 2016-07-28 DIAGNOSIS — Z833 Family history of diabetes mellitus: Secondary | ICD-10-CM | POA: Diagnosis not present

## 2016-07-28 DIAGNOSIS — Z803 Family history of malignant neoplasm of breast: Secondary | ICD-10-CM

## 2016-07-28 DIAGNOSIS — Z801 Family history of malignant neoplasm of trachea, bronchus and lung: Secondary | ICD-10-CM

## 2016-07-28 DIAGNOSIS — M545 Low back pain: Secondary | ICD-10-CM

## 2016-07-28 DIAGNOSIS — Z8051 Family history of malignant neoplasm of kidney: Secondary | ICD-10-CM

## 2016-07-28 DIAGNOSIS — Z836 Family history of other diseases of the respiratory system: Secondary | ICD-10-CM | POA: Diagnosis not present

## 2016-07-28 DIAGNOSIS — Z807 Family history of other malignant neoplasms of lymphoid, hematopoietic and related tissues: Secondary | ICD-10-CM

## 2016-07-28 DIAGNOSIS — Z8249 Family history of ischemic heart disease and other diseases of the circulatory system: Secondary | ICD-10-CM | POA: Diagnosis not present

## 2016-07-28 MED ORDER — OXYCODONE-ACETAMINOPHEN 5-325 MG PO TABS: 1.0000 | ORAL_TABLET | Freq: Four times a day (QID) | ORAL | 0 refills | Status: DC | PRN

## 2016-07-28 MED ORDER — LORAZEPAM 0.5 MG PO TABS: 0.5000 mg | ORAL_TABLET | Freq: Every day | ORAL | 0 refills | Status: DC | PRN

## 2016-07-28 NOTE — Patient Instructions (Signed)
We will refer you to a Pain Specialist.

## 2016-07-28 NOTE — Progress Notes (Signed)
CC: chronic pain HPI: [name] is a 51 y.o. female with a h/o of chronic pain on chronic opioid therapy who presents for refill of her narcotic medications.  Please see Problem-based charting for HPI and the status of patient's chronic medical conditions.  Past Medical History:  Diagnosis Date  . Acute gastritis without mention of hemorrhage 01/01/2002  . Anemia 10/2009   macrocytic anemia with baseline MCV 104-106  . Anxiety   . Asthma   . 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 Complex tear of medial meniscus of left knee as current injury 02/25/2016  . Complication of anesthesia    hard to wake up  . COPD (chronic obstructive pulmonary disease) (HCC)    chronic bronchitis  . Derangement of anterior horn of lateral meniscus of left knee 02/25/2016  . Duodenitis without mention of hemorrhage 01/01/2002  . DVT (deep venous thrombosis) (HCC)    history clot lt groin when she had lymphoma-in remision now.  . Gastroparesis   . GERD (gastroesophageal reflux disease)   . Hiatal hernia 01/01/2002  . HIV infection ([place])    CD4 = 570 (05/2010), VL undetectation  . Hyperlipidemia   . IBS (irritable bowel syndrome)   . Migraines    on topamax and triptans, frequent (almost daily) attacks   . 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 Beryle Beams is her oncologist.   . PUD (peptic ulcer disease) 07/1997   per EGD report 07/1997 with history of esophagitis  . Restless leg syndrome 12/24/2015  . Shingles    in lumbar dermatome  . Spondylolisthesis of lumbar region    L5-S1  . TMJ (dislocation of temporomandibular joint)   . Traumatic tear of lateral meniscus of right knee 04/20/2012   Social History  Substance Use Topics  . Smoking status: Never Smoker  . Smokeless tobacco: Never Used  . Alcohol use No   Family History  Problem Relation Age of Onset  . Hypertension Mother   . Stroke Mother   . Heart disease Mother   .  Breast cancer Paternal Grandmother   . Kidney cancer Maternal Uncle   . Heart disease Maternal Uncle   . Diabetes Maternal Uncle   . Diabetes Maternal Grandmother   . Heart disease Maternal Grandmother   . Emphysema Paternal Uncle     never smoker  . Emphysema Paternal Grandfather     never smoker  . Colon cancer Paternal Aunt     with met to lung   . Non-Hodgkin's lymphoma Maternal Uncle   . Heart disease Maternal Grandfather    Review of Systems: ROS in HPI. Otherwise: Review of Systems  Constitutional: Negative for chills, fever and weight loss.  Respiratory: Negative for cough and shortness of breath.   Cardiovascular: Negative for chest pain and leg swelling.  Gastrointestinal: Negative for abdominal pain, constipation, diarrhea, nausea and vomiting.  Genitourinary: Negative for dysuria, frequency and urgency.  Musculoskeletal: Positive for back pain and myalgias.   Physical Exam: Vitals:   07/28/16 1610  BP: (!) 142/75  Pulse: 88  Temp: 98 F (36.7 C)  TempSrc: Oral  SpO2: 99%  Weight: 293 lb 12.8 oz (133.3 kg)   Physical Exam  Constitutional: She is oriented to person, place, and time. No distress.  Obese  HENT:  Head: Normocephalic and atraumatic.  Eyes: EOM are normal. Pupils are equal, round, and reactive to light.  Neck: Normal range of motion.  Pulmonary/Chest:  Effort normal. No respiratory distress.  Musculoskeletal: Normal range of motion.  Neurological: She is alert and oriented to person, place, and time.  Skin: She is not diaphoretic.  Psychiatric: She has a normal mood and affect.    Assessment & Plan:  See encounters tab for problem based medical decision making. Patient discussed with Dr. Dareen Piano  Opioid dependence [facility]) Patient presents for refill of her Percocet 5-325 milligrams pain medications which she takes for chronic low back pain. Reports that she has been out of her pain medication for ~4 weeks. She reports that during this time  she has had 10 out of 10 pain and is unable to do daily activities such as vacuuming and getting around her house. She is an obese lady, sitting in the exam room without any acute distress.  Review of the [place] controlled substance database is demonstrates that she filled her prescription which was written by Dr. Arcelia Jew on 05/30/16 for #120 5-331m Percocet in 2 parts, the first for 54 pills on 05/30/16, then on 06/13/2016 for 64 pills. This prescription according to the patient lasted for several weeks and she ran out around January 1. That she takes her pills regularly every 6-8 hours as prescribed. She reports that these pills allow her to participate in her daily activities such as vacuuming and getting exercise. He denies trying any other therapeutic remedies such as over-the-counter Tylenol or ibuprofen. She reports that physical therapy has been ineffective in the past.  During Dr. RShela Commonslast visit with the patient on 05/30/2016, the patient had several red flags regarding her narcotic prescription use. 1) she had at least a one-month gap without any medication around the time of her orthopedic operation for her knee, during which time the patient was reportedly taking "stores of medication" that she had saved up. 2) the patient has been greater than one month without medication prior to her visit today and was able to ambulate into the building without difficulty and is sitting without acute distress in spite of being in 10 out of 10 pain. 3) the patient refused to provide a urinary drug screen sample during her appointment with Dr. WRedmond Pullingin June 2017 and reported that she was unable to urinate today in order to provide a urine drug sample, she notes that she is unable to stay even with drinking water in order to provide a sample. 4) the patient did not notify our clinic when she was prescribed pain medication for her knee surgery in August in order to discuss the appropriate recommendations  for her pain management.  In light of having had no medication for over one month, the patient reports that she has not had any symptoms of withdrawal. Specifically she denies any irritation, sweating, palpitations or tachycardia, headaches, myalgias.  Assessment: All of these red flags also constitute violations of her pain contract which was signed with Dr. WRedmond Pulling The patient denies any violations of the pain contract even after these specifics were explained to her. We have decided to not refill her pain medications today and instead refer her to pain management specialist where she will hopefully have close supervision if narcotic medications are prescribed. We have also offered the patient nonnarcotic remedies, specifically physical therapy has been shown to be beneficial for patients with degenerative disc disease and pain such as hers. The patient refuses his remedies at this time.  Plan: Referral to pain specialist. Discontinue all narcotic prescriptions. Do not refill further narcotic prescriptions.  Anxiety Patient reports  significant anxiety for which she is treated with fluoxetine 40 mg daily. She notes that this medication is helpful however she reports that she needs Ativan to help control sudden episodes of anxiety. Reviewed with currently controlled substance database reveals that she has only filled her Ativan prescription one time in the last 6 months for 30 pills 0.5 mg tablet. As a result I feel the patient does not actually need this medication and given her other red flags regarding her opioid medications I am hesitant to refill this medication.  Assessment: Anxiety which appears well controlled on fluoxetine. There may be episodes of anxiety which should be managed with lifestyle modification/trigger avoidance.  Plan: Feel the patient is high risk for benzodiazepine misuse and would avoid further restrictions. Continue fluoxetine 40 mg daily.   Signed: Holley Raring,  MD 07/28/2016, 5:32 PM  Pager: [phone]

## 2016-07-28 NOTE — Assessment & Plan Note (Addendum)
Patient presents for refill of her Percocet 5-325 milligrams pain medications which she takes for chronic low back pain. Reports that she has been out of her pain medication for ~4 weeks. She reports that during this time she has had 10 out of 10 pain and is unable to do daily activities such as vacuuming and getting around her house. She is an obese lady, sitting in the exam room without any acute distress.  Review of the [place] controlled substance database is demonstrates that she filled her prescription which was written by Dr. Arcelia Jew on 05/30/16 for #120 5-325mg  Percocet in 2 parts, the first for 54 pills on 05/30/16, then on 06/13/2016 for 64 pills. This prescription according to the patient lasted for several weeks and she ran out around January 1. That she takes her pills regularly every 6-8 hours as prescribed. She reports that these pills allow her to participate in her daily activities such as vacuuming and getting exercise. He denies trying any other therapeutic remedies such as over-the-counter Tylenol or ibuprofen. She reports that physical therapy has been ineffective in the past.  During Dr. Shela Commons last visit with the patient on 05/30/2016, the patient had several red flags regarding her narcotic prescription use. 1) she had at least a one-month gap without any medication around the time of her orthopedic operation for her knee, during which time the patient was reportedly taking "stores of medication" that she had saved up. 2) the patient has been greater than one month without medication prior to her visit today and was able to ambulate into the building without difficulty and is sitting without acute distress in spite of being in 10 out of 10 pain. 3) the patient refused to provide a urinary drug screen sample during her appointment with Dr. Redmond Pulling in June 2017 and reported that she was unable to urinate today in order to provide a urine drug sample, she notes that she is unable to  stay even with drinking water in order to provide a sample. 4) the patient did not notify our clinic when she was prescribed pain medication for her knee surgery in August in order to discuss the appropriate recommendations for her pain management.  In light of having had no medication for over one month, the patient reports that she has not had any symptoms of withdrawal. Specifically she denies any irritation, sweating, palpitations or tachycardia, headaches, myalgias.  Assessment: All of these red flags also constitute violations of her pain contract which was signed with Dr. Redmond Pulling. The patient denies any violations of the pain contract even after these specifics were explained to her. We have decided to not refill her pain medications today and instead refer her to pain management specialist where she will hopefully have close supervision if narcotic medications are prescribed. We have also offered the patient nonnarcotic remedies, specifically physical therapy has been shown to be beneficial for patients with degenerative disc disease and pain such as hers. The patient refuses his remedies at this time.  Plan: Referral to pain specialist. Discontinue all narcotic prescriptions. Do not refill further narcotic prescriptions.

## 2016-07-28 NOTE — Assessment & Plan Note (Signed)
Patient reports significant anxiety for which she is treated with fluoxetine 40 mg daily. She notes that this medication is helpful however she reports that she needs Ativan to help control sudden episodes of anxiety. Reviewed with currently controlled substance database reveals that she has only filled her Ativan prescription one time in the last 6 months for 30 pills 0.5 mg tablet. As a result I feel the patient does not actually need this medication and given her other red flags regarding her opioid medications I am hesitant to refill this medication.  Assessment: Anxiety which appears well controlled on fluoxetine. There may be episodes of anxiety which should be managed with lifestyle modification/trigger avoidance.  Plan: Feel the patient is high risk for benzodiazepine misuse and would avoid further restrictions. Continue fluoxetine 40 mg daily.

## 2016-07-29 NOTE — Progress Notes (Signed)
Internal Medicine Clinic Attending  Case discussed with Dr. Strelow at the time of the visit.  We reviewed the resident's history and exam and pertinent patient test results.  I agree with the assessment, diagnosis, and plan of care documented in the resident's note.  

## 2016-08-01 ENCOUNTER — Ambulatory Visit: Payer: Medicaid Other | Admitting: Infectious Disease

## 2016-08-03 ENCOUNTER — Ambulatory Visit: Payer: Medicaid Other | Admitting: Neurology

## 2016-08-10 ENCOUNTER — Encounter: Payer: Self-pay | Admitting: Neurology

## 2016-08-10 ENCOUNTER — Telehealth: Payer: Self-pay | Admitting: *Deleted

## 2016-08-10 ENCOUNTER — Ambulatory Visit (INDEPENDENT_AMBULATORY_CARE_PROVIDER_SITE_OTHER): Payer: Medicaid Other | Admitting: Neurology

## 2016-08-10 VITALS — BP 123/71 | HR 91 | Ht 66.0 in | Wt 291.0 lb

## 2016-08-10 DIAGNOSIS — G43719 Chronic migraine without aura, intractable, without status migrainosus: Secondary | ICD-10-CM | POA: Diagnosis not present

## 2016-08-10 DIAGNOSIS — G2581 Restless legs syndrome: Secondary | ICD-10-CM

## 2016-08-10 MED ORDER — TIZANIDINE HCL 4 MG PO TABS: 4.0000 mg | ORAL_TABLET | Freq: Two times a day (BID) | ORAL | 11 refills | Status: DC | PRN

## 2016-08-10 NOTE — Telephone Encounter (Signed)
No showed Botox appointment. 

## 2016-08-10 NOTE — Telephone Encounter (Signed)
Patient was seen today.  She arrived late to her appt - states she was misinformed about the arrival time.

## 2016-08-10 NOTE — Progress Notes (Signed)
PATIENT: [name] DOB: Nov 15, 1965  Chief Complaint  Patient presents with  . Migraine    Botox 200 units - specialty pharmacy     HISTORICAL  [name], Is accompanied by her sister returned for Botox injection as migraine prevention  She has past medical history of HIV, hyperlipidemia, migraines, depression, and non-Hodgkin lymphoma in 2005 with chemotherapy and radiation.   She has history of migraines since 19. In the last 10 years, she reports having daily migraines. She is currently taking Topamax and Zomig with relief. She takes approximately 20-25 tablets of her Zomig per month.    She has aura's that look like floating dirt particles, flashing lights, and she sees Christmas tree lights when they are severe, she has nausea, vomiting and sometimes diarrhea when they're severe. She also complains of diplopia, blurry vision, and  photophobia. She also has had a lightning bolt sensation throughout her body.  She describes them as pulsating and throbbing  on a scale of 5-6/10 which she considers mild, when they are severe they can go as high as 10/10. They usually start in the right temporal area, but will also start on the left temporal and are worse.   Any type of smoke or MSG can precipitate a migraine. They generally last 2-9 days. She has been to the emergency department twice in the last year for migraines. She states "I just can't do this anymore I need to do something about her migraines"  She has tried and failed different preventive medications:  atenolol, verapamil, nortriptyline, Inderal, Depakote without benefit. Atenolol and verapamil caused hypotension. Depakote caused severe vomiting. Topamax causing her worsening headaches, will show tried Zonegran, Topamax ER, she denies significant improvement.  For abortive treatment, she has tried Imitrex, Maxalt with suboptimal response, Zomig as needed since to work best,  she also takes transderm scophalamine patch for  nause  I started to give her BOTOX injection since May 2013, every 3 months, she responded very well,   Previous MRI brain was normal in 2013.  Last Botox injection as migraine prevention was March 2017, she responded very well, but in recent few weeks, she noticed increased migraine headaches, this baby had headache left retro-orbital area severe pounding headache with associated light noise sensitivity, has been ongoing for 3 days, failed to respond to Zomig by mouth, Imitrex injection as needed, she has constant dry heaves, difficulty opening her eyes, In addition, today she complains of restless leg symptoms, difficult to stand or lying still for extended period of time, has tried Neurontin-cause nausea, confusion,  Lyrica-cause excessive weight gain, she has never tried dopamine agonist in the past  I reviewed laboratory in April 2017, normal hemoglobin 12 point 9, mild elevated RDW 15.8, decreased MCV   Update April 27 2016  Last Botox injection was in June 2017, she had left knee arthroscopic surgery in August 2017, complaining of significant joints pain, unsteady gait, before Botox she was having moderate to severe headache daily, even Botox now, she used to 12 tablets of Zomig every months because of frequent moderate to severe headache  Update August 10 2016: Last Botox injection was in October 2017, has been very helpful, but over the past 3-4 weeks, she had frequent almost daily headaches, oftentimes wake her up from sleep, Zomig has been very helpful,  Low dose Mirapex 0.25 milligrams 2 tablets every night has been very helpful for her restless leg symptoms, she reported 80% improvement, laboratory reviewed in January  2018, CD4 count 760, normal hemoglobin 12.3, mildly decreased MCV, increased RDW,  REVIEW OF SYSTEMS: Full 14 system review of systems performed and notable only for as above  ALLERGIES: Allergies  Allergen Reactions  . Diazepam     Triggers migraines  .  Divalproex Sodium Nausea And Vomiting    Causes light-headedness  . Gabapentin Nausea And Vomiting    confusion  . Ondansetron Hcl Nausea And Vomiting    Triggers migraines, "makes me vomit"  . Penicillins     REACTION: lips swollen to point of bleeding, sever vaginal irritation  Reports tolerating augmentin well without any complaints or reaction  . Propoxyphene N-Acetaminophen Nausea And Vomiting  . Simvastatin     PATIENT CANNOT BE PRESCRIBED THIS STATIN WHILE RECEIVING NORVIR  . Topamax [Topiramate]     Migraines   . Tramadol Hcl Itching    REACTION: migraines and itching--hx of migraines even without medication   . Zocor [Simvastatin - High Dose]     HOME MEDICATIONS: Current Outpatient Prescriptions  Medication Sig Dispense Refill  . Beclomethasone Dipropionate 80 MCG/ACT AERS Place 2 Inhalers into the nose daily. 8.7 g 10  . Boric Acid POWD 600 mg per vagina for 7 days 25000 g 6  . botulinum toxin Type A (BOTOX) 100 UNITS SOLR injection INJECT 200 UNITS IM INTO  HEAD NECK EVERY 3 MONTHS    . Calcium Carbonate-Vitamin D (CALCIUM-VITAMIN D) 500-200 MG-UNIT per tablet Take 2 tablets by mouth daily.     . clotrimazole (MYCELEX) 10 MG troche Take 10 mg by mouth.    . DESCOVY 200-25 MG tablet TAKE 1 TABLET BY MOUTH EVERY DAY 30 tablet 2  . diclofenac sodium (VOLTAREN) 1 % GEL Apply 4 g topically 4 (four) times daily. 1 Tube 2  . DULERA 100-5 MCG/ACT AERO USE 2 PUFFS BY MOUTH THE FIRST THING IN THE MORNING AND THEN ANOTHER 2 PUFFS ABOUT 12 HOURS LATER 13 g 5  . fluconazole (DIFLUCAN) 100 MG tablet Take 1 tablet (100 mg total) by mouth daily. 14 tablet 4  . fluocinonide (LIDEX) 0.05 % external solution 1 gtt AU qD prn itching    . FLUoxetine (PROZAC) 40 MG capsule Take 1 capsule (40 mg total) by mouth daily. 30 capsule 3  . HYDROmorphone (DILAUDID) 2 MG tablet Take 1 tablet (2 mg total) by mouth every 4 (four) hours as needed for severe pain. 50 tablet 0  . NEXIUM 40 MG capsule  TAKE 1 CAPSULE BY MOUTH 2 TIMES DAILY BEFORE A MEAL 60 capsule 0  . pramipexole (MIRAPEX) 0.25 MG tablet Take 2 tablets (0.5 mg total) by mouth at bedtime. 60 tablet 11  . PREZCOBIX 800-150 MG tablet TAKE 1 TABLET BY MOUTH EVERY DAY 30 tablet 5  . PROAIR HFA 108 (90 Base) MCG/ACT inhaler USE 1 TO 2 PUFFS EVERY 4 TO 6 HOURS AS NEEDED SHORTNESS OF BREATH OR WHEEZING 8.5 g 1  . prochlorperazine (COMPAZINE) 10 MG tablet Take 1 tablet (10 mg total) by mouth every 6 (six) hours as needed for nausea or vomiting. 30 tablet 11  . scopolamine (TRANSDERM-SCOP, 1.5 MG,) 1 MG/3DAYS Place 1 patch (1.5 mg total) onto the skin every 3 (three) days. 10 patch 11  . Tamsulosin HCl (FLOMAX) 0.4 MG CAPS Take 0.4 mg by mouth daily. 1-2 daily    . VOLTAREN 1 % GEL APPLY 2 GRAMS TOPICALLY 4 TIMES DAILY 100 g 2  . zolmitriptan (ZOMIG) 5 MG tablet Take 1 tab at onset  of migraine.  May repeat, with 1 tab, in 2 hrs, if needed.  Max dose of 2 tabs/24hrs.  Must last 30 days. #12/30 12 tablet 11   No current facility-administered medications for this visit.     PAST MEDICAL HISTORY: Past Medical History:  Diagnosis Date  . Acute gastritis without mention of hemorrhage 01/01/2002  . Anemia 10/2009   macrocytic anemia with baseline MCV 104-106  . Anxiety   . Asthma   . 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 Complex tear of medial meniscus of left knee as current injury 02/25/2016  . Complication of anesthesia    hard to wake up  . COPD (chronic obstructive pulmonary disease) (HCC)    chronic bronchitis  . Derangement of anterior horn of lateral meniscus of left knee 02/25/2016  . Duodenitis without mention of hemorrhage 01/01/2002  . DVT (deep venous thrombosis) (HCC)    history clot lt groin when she had lymphoma-in remision now.  . Gastroparesis   . GERD (gastroesophageal reflux disease)   . Hiatal hernia 01/01/2002  . HIV infection ([place])    CD4 = 570 (05/2010), VL undetectation  . Hyperlipidemia   . IBS  (irritable bowel syndrome)   . Migraines    on topamax and triptans, frequent (almost daily) attacks   . Non Hodgkin's lymphoma ([place])    stage II, s/p resection, chemotherapy, radiotherapy, Dr. Beryle Beams is her oncologist.   . PUD (peptic ulcer disease) 07/1997   per EGD report 07/1997 with history of esophagitis  . Restless leg syndrome 12/24/2015  . Shingles    in lumbar dermatome  . Spondylolisthesis of lumbar region    L5-S1  . TMJ (dislocation of temporomandibular joint)   . Traumatic tear of lateral meniscus of right knee 04/20/2012    PAST SURGICAL HISTORY: Past Surgical History:  Procedure Laterality Date  . CHOLECYSTECTOMY    . ENDOMETRIAL ABLATION    . KNEE ARTHROSCOPY Right 04/20/12  . KNEE ARTHROSCOPY WITH LATERAL MENISECTOMY Left 02/25/2016   Procedure: LEFT KNEE ARTHROSCOPY WITH MEDIAL AND  LATERAL MENISECTOMIES;  Surgeon: Marchia Bond, MD;  Location: [facility];  Service: Orthopedics;  Laterality: Left;  . KNEE ARTHROSCOPY WITH MEDIAL MENISECTOMY Left 02/25/2016   Procedure: KNEE ARTHROSCOPY WITH MEDIAL MENISECTOMY;  Surgeon: Marchia Bond, MD;  Location: [facility];  Service: Orthopedics;  Laterality: Left;  . TONSILLECTOMY    . TYMPANOPLASTY Left   . TYMPANOSTOMY TUBE PLACEMENT     as child-both  . UPPER GASTROINTESTINAL ENDOSCOPY  2003, 2007   done by Dr. Velora Heckler    FAMILY HISTORY: Family History  Problem Relation Age of Onset  . Hypertension Mother   . Stroke Mother   . Heart disease Mother   . Breast cancer Paternal Grandmother   . Kidney cancer Maternal Uncle   . Heart disease Maternal Uncle   . Diabetes Maternal Uncle   . Diabetes Maternal Grandmother   . Heart disease Maternal Grandmother   . Emphysema Paternal Uncle     never smoker  . Emphysema Paternal Grandfather     never smoker  . Colon cancer Paternal Aunt     with met to lung   . Non-Hodgkin's lymphoma Maternal Uncle   . Heart disease Maternal  Grandfather     SOCIAL HISTORY:  Social History   Social History  . Marital status: Divorced    Spouse name: N/A  . Number of children: 0  . Years of education: 71  Occupational History  . disabled Unemployed   Social History Main Topics  . Smoking status: Never Smoker  . Smokeless tobacco: Never Used  . Alcohol use No  . Drug use: No  . Sexual activity: Not Currently    Partners: Male    Birth control/ protection: Surgical   Other Topics Concern  . Not on file   Social History Narrative      Patient is disabled.   Right handed.   Caffeine - Diet soda., tea   Education - high school     PHYSICAL EXAM   Vitals:   08/10/16 1026  BP: 123/71  Pulse: 91  Weight: 291 lb (132 kg)  Height: '5\' 6"'$  (1.676 m)    Not recorded      Body mass index is 46.97 kg/m.  PHYSICAL EXAMNIATION:  Gen: NAD, conversant, well nourised, obese, well groomed                     Cardiovascular: Regular rate rhythm, no peripheral edema, warm, nontender. Eyes: Conjunctivae clear without exudates or hemorrhage Neck: Supple, no carotid bruise. Pulmonary: Clear to auscultation bilaterally   NEUROLOGICAL EXAM:  MENTAL STATUS: Speech:    Speech is normal; fluent and spontaneous with normal comprehension.  Cognition:     Orientation to time, place and person     Normal recent and remote memory     Normal Attention span and concentration     Normal Language, naming, repeating,spontaneous speech     Fund of knowledge   CRANIAL NERVES: CN II: Visual fields are full to confrontation. Fundoscopic exam is normal with sharp discs and no vascular changes. Pupils are round equal and briskly reactive to light. CN III, IV, VI: extraocular movement are normal. No ptosis. CN V: Facial sensation is intact to pinprick in all 3 divisions bilaterally. Corneal responses are intact.  CN VII: Face is symmetric with normal eye closure and smile. CN VIII: Hearing is normal to rubbing fingers CN IX,  X: Palate elevates symmetrically. Phonation is normal. CN XI: Head turning and shoulder shrug are intact CN XII: Tongue is midline with normal movements and no atrophy.  MOTOR: There is no pronator drift of out-stretched arms. Muscle bulk and tone are normal. Muscle strength is normal.  REFLEXES: Reflexes are 2+ and symmetric at the biceps, triceps, knees, and ankles. Plantar responses are flexor.  SENSORY: Intact to light touch, pinprick, positional sensation and vibratory sensation are intact in fingers and toes.  COORDINATION: Rapid alternating movements and fine finger movements are intact. There is no dysmetria on finger-to-nose and heel-knee-shin.    GAIT/STANCE: Posture is normal. Gait is steady with normal steps, base, arm swing, and turning. Heel and toe walking are normal. Tandem gait is normal.  Romberg is absent.  DIAGNOSTIC DATA (LABS, IMAGING, TESTING) - I reviewed patient records, labs, notes, testing and imaging myself where available.   ASSESSMENT AND PLAN  KRIS BURD is a 51 y.o. female    History of HIV, hypercholesterolemia, Non hodgkin' lymphoma with chemotherapy and radiation and migraines since age 61.  Chronic migraine headaches She has tried and failed multiple preventive medications including atenolol, verapamil, nortriptyline, and Depakote,Topamax, Topamax ER,   She has migraines daily for more than  10 years. She is currently taking  zonogram  and Zomig with some relief. Previously tried imitrex and Maxalt with suboptimal response,  Normal MRI brain, and neurological exam.  BOTOX injection was performed according to protocol by [facility].  100 units of BOTOX was dissolved into 2 cc NS  Used total 200 units  Corrugator 2 sites, 10 units Procerus 1 site, 5 unit Frontalis 4 sites,  20 units, Temporalis 8 sites,  40 units  Occipitalis 6 sites, 30 units Cervical Paraspinal, 4 sites, 20 units Trapezius, 6 sites, 30 units  Extra 45 units were injected  along bilateral temporoparietal region Also add on Zanaflex 4 mg twice a day as needed  Restless leg symptoms   Continue Mirapex 0.2 5 mg, may take up to 2 tablets every night, which has been very helpful  Iron panel including ferritin today     Marcial Pacas, M.D. Ph.D.  [facility] [address] [place], [place] [address] Ph: [phone] Fax: [phone]  CC: Referring Provider

## 2016-08-10 NOTE — Progress Notes (Signed)
*  Botox 100 units x 2 vials, NDC [id number], Lot [id number], Exp 01/2019, specialty pharmacy.//mck,rn**

## 2016-08-11 LAB — IRON AND TIBC
Iron Saturation: 12 % — ABNORMAL LOW (ref 15–55)
Iron: 43 ug/dL (ref 27–159)
TIBC: 352 ug/dL (ref 250–450)
UIBC: 309 ug/dL (ref 131–425)

## 2016-08-11 LAB — FERRITIN: FERRITIN: 51 ng/mL (ref 15–150)

## 2016-08-31 ENCOUNTER — Ambulatory Visit: Payer: Medicaid Other | Admitting: Infectious Disease

## 2016-09-16 ENCOUNTER — Other Ambulatory Visit: Payer: Self-pay | Admitting: Internal Medicine

## 2016-09-16 ENCOUNTER — Other Ambulatory Visit: Payer: Self-pay | Admitting: Infectious Disease

## 2016-10-11 ENCOUNTER — Other Ambulatory Visit: Payer: Self-pay | Admitting: Sports Medicine

## 2016-10-12 ENCOUNTER — Other Ambulatory Visit: Payer: Self-pay | Admitting: Internal Medicine

## 2016-10-14 ENCOUNTER — Other Ambulatory Visit: Payer: Self-pay | Admitting: Infectious Disease

## 2016-10-14 ENCOUNTER — Other Ambulatory Visit: Payer: Self-pay | Admitting: Obstetrics and Gynecology

## 2016-10-19 ENCOUNTER — Ambulatory Visit: Payer: Medicaid Other | Admitting: Infectious Disease

## 2016-11-04 ENCOUNTER — Other Ambulatory Visit: Payer: Self-pay | Admitting: Neurology

## 2016-11-08 ENCOUNTER — Other Ambulatory Visit: Payer: Self-pay | Admitting: Sports Medicine

## 2016-11-09 ENCOUNTER — Encounter (INDEPENDENT_AMBULATORY_CARE_PROVIDER_SITE_OTHER): Payer: Self-pay

## 2016-11-09 ENCOUNTER — Encounter: Payer: Self-pay | Admitting: Neurology

## 2016-11-09 ENCOUNTER — Ambulatory Visit (INDEPENDENT_AMBULATORY_CARE_PROVIDER_SITE_OTHER): Payer: Medicaid Other | Admitting: Neurology

## 2016-11-09 ENCOUNTER — Telehealth: Payer: Self-pay | Admitting: Neurology

## 2016-11-09 VITALS — BP 125/72 | HR 87 | Ht 66.0 in | Wt 313.0 lb

## 2016-11-09 DIAGNOSIS — G43719 Chronic migraine without aura, intractable, without status migrainosus: Secondary | ICD-10-CM | POA: Diagnosis not present

## 2016-11-09 MED ORDER — PRAMIPEXOLE DIHYDROCHLORIDE 0.25 MG PO TABS: 0.7500 mg | ORAL_TABLET | Freq: Every day | ORAL | 11 refills | Status: DC

## 2016-11-09 MED ORDER — PROCHLORPERAZINE MALEATE 10 MG PO TABS: 10.0000 mg | ORAL_TABLET | Freq: Four times a day (QID) | ORAL | 11 refills | Status: DC | PRN

## 2016-11-09 NOTE — Progress Notes (Signed)
PATIENT: SHALISA MCQUADE DOB: Nov 15, 1965  Chief Complaint  Patient presents with  . Migraine    Botox 200 units - specialty pharmacy     HISTORICAL  [name], Is accompanied by her sister returned for Botox injection as migraine prevention  She has past medical history of HIV, hyperlipidemia, migraines, depression, and non-Hodgkin lymphoma in 2005 with chemotherapy and radiation.   She has history of migraines since 51. In the last 10 years, she reports having daily migraines. She is currently taking Topamax and Zomig with relief. She takes approximately 20-25 tablets of her Zomig per month.    She has aura's that look like floating dirt particles, flashing lights, and she sees Christmas tree lights when they are severe, she has nausea, vomiting and sometimes diarrhea when they're severe. She also complains of diplopia, blurry vision, and  photophobia. She also has had a lightning bolt sensation throughout her body.  She describes them as pulsating and throbbing  on a scale of 5-6/10 which she considers mild, when they are severe they can go as high as 10/10. They usually start in the right temporal area, but will also start on the left temporal and are worse.   Any type of smoke or MSG can precipitate a migraine. They generally last 2-9 days. She has been to the emergency department twice in the last year for migraines. She states "I just can't do this anymore I need to do something about her migraines"  She has tried and failed different preventive medications:  atenolol, verapamil, nortriptyline, Inderal, Depakote without benefit. Atenolol and verapamil caused hypotension. Depakote caused severe vomiting. Topamax causing her worsening headaches, will show tried Zonegran, Topamax ER, she denies significant improvement.  For abortive treatment, she has tried Imitrex, Maxalt with suboptimal response, Zomig as needed since to work best,  she also takes transderm scophalamine patch for  nause  I started to give her BOTOX injection since May 2013, every 3 months, she responded very well,   Previous MRI brain was normal in 2013.  Last Botox injection as migraine prevention was March 2017, she responded very well, but in recent few weeks, she noticed increased migraine headaches, this baby had headache left retro-orbital area severe pounding headache with associated light noise sensitivity, has been ongoing for 3 days, failed to respond to Zomig by mouth, Imitrex injection as needed, she has constant dry heaves, difficulty opening her eyes, In addition, today she complains of restless leg symptoms, difficult to stand or lying still for extended period of time, has tried Neurontin-cause nausea, confusion,  Lyrica-cause excessive weight gain, she has never tried dopamine agonist in the past  I reviewed laboratory in April 2017, normal hemoglobin 12 point 9, mild elevated RDW 15.8, decreased MCV   Update April 27 2016  Last Botox injection was in June 2017, she had left knee arthroscopic surgery in August 2017, complaining of significant joints pain, unsteady gait, before Botox she was having moderate to severe headache daily, even Botox now, she used to 12 tablets of Zomig every months because of frequent moderate to severe headache  Update August 10 2016: Last Botox injection was in October 2017, has been very helpful, but over the past 3-4 weeks, she had frequent almost daily headaches, oftentimes wake her up from sleep, Zomig has been very helpful,  Low dose Mirapex 0.25 milligrams 2 tablets every night has been very helpful for her restless leg symptoms, she reported 80% improvement, laboratory reviewed in January  2018, CD4 count 760, normal hemoglobin 12.3, mildly decreased MCV, increased RDW,  Update Nov 09 2016: She suffered bilateral ear infection, strep throat since last visit, had frequent headaches, nausea,  REVIEW OF SYSTEMS: Full 14 system review of systems  performed and notable only for as above  ALLERGIES: Allergies  Allergen Reactions  . Diazepam     Triggers migraines  . Divalproex Sodium Nausea And Vomiting    Causes light-headedness  . Gabapentin Nausea And Vomiting    confusion  . Ondansetron Hcl Nausea And Vomiting    Triggers migraines, "makes me vomit"  . Penicillins     REACTION: lips swollen to point of bleeding, sever vaginal irritation  Reports tolerating augmentin well without any complaints or reaction  . Propoxyphene N-Acetaminophen Nausea And Vomiting  . Simvastatin     PATIENT CANNOT BE PRESCRIBED THIS STATIN WHILE RECEIVING NORVIR  . Topamax [Topiramate]     Migraines   . Tramadol Hcl Itching    REACTION: migraines and itching--hx of migraines even without medication   . Zocor [Simvastatin - High Dose]     HOME MEDICATIONS: Current Outpatient Prescriptions  Medication Sig Dispense Refill  . Beclomethasone Dipropionate 80 MCG/ACT AERS Place 2 Inhalers into the nose daily. 8.7 g 10  . Boric Acid POWD 600 mg per vagina for 7 days 25000 g 6  . BOTOX 100 units SOLR injection MD IS TO INJECT 200 UNITS INTRAMUSCULARLY INTO HEAD AND NECK MUSCLES EVERY 3 MONTHS 2 each 3  . Calcium Carbonate-Vitamin D (CALCIUM-VITAMIN D) 500-200 MG-UNIT per tablet Take 2 tablets by mouth daily.     . clotrimazole (MYCELEX) 10 MG troche Take 10 mg by mouth.    . DESCOVY 200-25 MG tablet TAKE 1 TABLET BY MOUTH EVERY DAY 30 tablet 3  . diclofenac sodium (VOLTAREN) 1 % GEL Apply 4 g topically 4 (four) times daily. 1 Tube 2  . DULERA 100-5 MCG/ACT AERO USE 2 PUFFS BY MOUTH THE FIRST THING IN THE MORNING AND THEN ANOTHER 2 PUFFS ABOUT 12 HOURS LATER 13 g 5  . fluconazole (DIFLUCAN) 100 MG tablet TAKE 1 TABLET BY MOUTH EVERY DAY 14 tablet 3  . fluocinonide (LIDEX) 0.05 % external solution 1 gtt AU qD prn itching    . FLUoxetine (PROZAC) 40 MG capsule TAKE 1 CAPSULE BY MOUTH DAILY 30 capsule 2  . fluticasone (FLONASE) 50 MCG/ACT nasal spray  Place 2 sprays into both nostrils daily.    Marland Kitchen HYDROmorphone (DILAUDID) 2 MG tablet Take 1 tablet (2 mg total) by mouth every 4 (four) hours as needed for severe pain. 50 tablet 0  . NEXIUM 40 MG capsule TAKE 1 CAPSULE BY MOUTH 2 TIMES DAILY BEFORE A MEAL 60 capsule 0  . pramipexole (MIRAPEX) 0.25 MG tablet Take 2 tablets (0.5 mg total) by mouth at bedtime. 60 tablet 11  . PREZCOBIX 800-150 MG tablet TAKE 1 TABLET BY MOUTH EVERY DAY 30 tablet 5  . PROAIR HFA 108 (90 Base) MCG/ACT inhaler USE 1 TO 2 PUFFS EVERY 4 TO 6 HOURS AS NEEDED SHORTNESS OF BREATH OR WHEEZING 8.5 g 1  . prochlorperazine (COMPAZINE) 10 MG tablet Take 1 tablet (10 mg total) by mouth every 6 (six) hours as needed for nausea or vomiting. 30 tablet 11  . scopolamine (TRANSDERM-SCOP, 1.5 MG,) 1 MG/3DAYS Place 1 patch (1.5 mg total) onto the skin every 3 (three) days. 10 patch 11  . Tamsulosin HCl (FLOMAX) 0.4 MG CAPS Take 0.4 mg by mouth daily.  1-2 daily    . tiZANidine (ZANAFLEX) 4 MG tablet Take 1 tablet (4 mg total) by mouth 2 (two) times daily as needed for muscle spasms. 60 tablet 11  . zolmitriptan (ZOMIG) 5 MG tablet Take 1 tab at onset of migraine.  May repeat, with 1 tab, in 2 hrs, if needed.  Max dose of 2 tabs/24hrs.  Must last 30 days. #12/30 12 tablet 11   No current facility-administered medications for this visit.     PAST MEDICAL HISTORY: Past Medical History:  Diagnosis Date  . Acute gastritis without mention of hemorrhage 01/01/2002  . Anemia 10/2009   macrocytic anemia with baseline MCV 104-106  . Anxiety   . Asthma   . 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 Complex tear of medial meniscus of left knee as current injury 02/25/2016  . Complication of anesthesia    hard to wake up  . COPD (chronic obstructive pulmonary disease) (HCC)    chronic bronchitis  . Derangement of anterior horn of lateral meniscus of left knee 02/25/2016  . Duodenitis without mention of hemorrhage 01/01/2002  . DVT (deep venous  thrombosis) (HCC)    history clot lt groin when she had lymphoma-in remision now.  . Gastroparesis   . GERD (gastroesophageal reflux disease)   . Hiatal hernia 01/01/2002  . HIV infection ([place])    CD4 = 570 (05/2010), VL undetectation  . Hyperlipidemia   . IBS (irritable bowel syndrome)   . Migraines    on topamax and triptans, frequent (almost daily) attacks   . Non Hodgkin's lymphoma ([place])    stage II, s/p resection, chemotherapy, radiotherapy, Dr. Beryle Beams is her oncologist.   . PUD (peptic ulcer disease) 07/1997   per EGD report 07/1997 with history of esophagitis  . Restless leg syndrome 12/24/2015  . Shingles    in lumbar dermatome  . Spondylolisthesis of lumbar region    L5-S1  . TMJ (dislocation of temporomandibular joint)   . Traumatic tear of lateral meniscus of right knee 04/20/2012    PAST SURGICAL HISTORY: Past Surgical History:  Procedure Laterality Date  . CHOLECYSTECTOMY    . ENDOMETRIAL ABLATION    . KNEE ARTHROSCOPY Right 04/20/12  . KNEE ARTHROSCOPY WITH LATERAL MENISECTOMY Left 02/25/2016   Procedure: LEFT KNEE ARTHROSCOPY WITH MEDIAL AND  LATERAL MENISECTOMIES;  Surgeon: Marchia Bond, MD;  Location: [facility];  Service: Orthopedics;  Laterality: Left;  . KNEE ARTHROSCOPY WITH MEDIAL MENISECTOMY Left 02/25/2016   Procedure: KNEE ARTHROSCOPY WITH MEDIAL MENISECTOMY;  Surgeon: Marchia Bond, MD;  Location: [facility];  Service: Orthopedics;  Laterality: Left;  . TONSILLECTOMY    . TYMPANOPLASTY Left   . TYMPANOSTOMY TUBE PLACEMENT     as child-both  . UPPER GASTROINTESTINAL ENDOSCOPY  2003, 2007   done by Dr. Velora Heckler    FAMILY HISTORY: Family History  Problem Relation Age of Onset  . Hypertension Mother   . Stroke Mother   . Heart disease Mother   . Breast cancer Paternal Grandmother   . Kidney cancer Maternal Uncle   . Heart disease Maternal Uncle   . Diabetes Maternal Uncle   . Diabetes Maternal Grandmother   .  Heart disease Maternal Grandmother   . Emphysema Paternal Uncle     never smoker  . Emphysema Paternal Grandfather     never smoker  . Colon cancer Paternal Aunt     with met to lung   . Non-Hodgkin's lymphoma Maternal Uncle   .  Heart disease Maternal Grandfather     SOCIAL HISTORY:  Social History   Social History  . Marital status: Divorced    Spouse name: N/A  . Number of children: 0  . Years of education: 12   Occupational History  . disabled Unemployed   Social History Main Topics  . Smoking status: Never Smoker  . Smokeless tobacco: Never Used  . Alcohol use No  . Drug use: No  . Sexual activity: Not Currently    Partners: Male    Birth control/ protection: Surgical   Other Topics Concern  . Not on file   Social History Narrative      Patient is disabled.   Right handed.   Caffeine - Diet soda., tea   Education - high school     PHYSICAL EXAM   Vitals:   11/09/16 1401  BP: 125/72  Pulse: 87  Weight: (!) 313 lb (142 kg)  Height: 5' 6" (1.676 m)    Not recorded      Body mass index is 50.52 kg/m.  PHYSICAL EXAMNIATION:  Gen: NAD, conversant, well nourised, obese, well groomed                     Cardiovascular: Regular rate rhythm, no peripheral edema, warm, nontender. Eyes: Conjunctivae clear without exudates or hemorrhage Neck: Supple, no carotid bruise. Pulmonary: Clear to auscultation bilaterally   NEUROLOGICAL EXAM:  MENTAL STATUS: Speech:    Speech is normal; fluent and spontaneous with normal comprehension.  Cognition:     Orientation to time, place and person     Normal recent and remote memory     Normal Attention span and concentration     Normal Language, naming, repeating,spontaneous speech     Fund of knowledge   CRANIAL NERVES: CN II: Visual fields are full to confrontation. Fundoscopic exam is normal with sharp discs and no vascular changes. Pupils are round equal and briskly reactive to light. CN III, IV, VI:  extraocular movement are normal. No ptosis. CN V: Facial sensation is intact to pinprick in all 3 divisions bilaterally. Corneal responses are intact.  CN VII: Face is symmetric with normal eye closure and smile. CN VIII: Hearing is normal to rubbing fingers CN IX, X: Palate elevates symmetrically. Phonation is normal. CN XI: Head turning and shoulder shrug are intact CN XII: Tongue is midline with normal movements and no atrophy.  MOTOR: There is no pronator drift of out-stretched arms. Muscle bulk and tone are normal. Muscle strength is normal.  REFLEXES: Reflexes are 2+ and symmetric at the biceps, triceps, knees, and ankles. Plantar responses are flexor.  SENSORY: Intact to light touch, pinprick, positional sensation and vibratory sensation are intact in fingers and toes.  COORDINATION: Rapid alternating movements and fine finger movements are intact. There is no dysmetria on finger-to-nose and heel-knee-shin.    GAIT/STANCE: Posture is normal. Gait is steady with normal steps, base, arm swing, and turning. Heel and toe walking are normal. Tandem gait is normal.  Romberg is absent.  DIAGNOSTIC DATA (LABS, IMAGING, TESTING) - I reviewed patient records, labs, notes, testing and imaging myself where available.   ASSESSMENT AND PLAN  AMARIONNA ARCA is a 51 y.o. female    History of HIV, hypercholesterolemia, Non hodgkin' lymphoma with chemotherapy and radiation and migraines since age 24.  Chronic migraine headaches She has tried and failed multiple preventive medications including atenolol, verapamil, nortriptyline, and Depakote,Topamax, Topamax ER,   She has migraines daily  for more than  10 years. She is currently taking  zonogram  and Zomig with some relief. Previously tried imitrex and Maxalt with suboptimal response,  Normal MRI brain, and neurological exam.  BOTOX injection was performed according to protocol by [employer]. 100 units of BOTOX was dissolved into 2 cc NS  Used  total 200 units  Corrugator 2 sites, 10 units Procerus 1 site, 5 unit Frontalis 4 sites,  20 units, Temporalis 8 sites,  40 units  Occipitalis 6 sites, 30 units Cervical Paraspinal, 4 sites, 20 units Trapezius, 6 sites, 30 units  Extra 30 units were injected along bilateral masseters  15 units was equally divided at bilateral parietotemporal region  Also add on Zanaflex 4 mg twice a day as needed  Restless leg symptoms   Continue Mirapex 0.2 5 mg,  3 tablets every night  Ferritin 51 in Feb 2018,     Marcial Pacas, M.D. Ph.D.  [facility] [address] [place], [place] [address] Ph: [phone] Fax: [phone]  CC: Referring Provider

## 2016-11-09 NOTE — Progress Notes (Signed)
**  Botox 100 units x 2 vials, NDC 5093-2671-24, Lot [id number], Exp 11/2018, specialty pharmacy.//mck,rn**

## 2016-11-09 NOTE — Telephone Encounter (Signed)
Per Dr. Krista Blue, pt needs botox.

## 2016-11-11 ENCOUNTER — Other Ambulatory Visit: Payer: Self-pay | Admitting: Sports Medicine

## 2016-11-12 ENCOUNTER — Other Ambulatory Visit: Payer: Self-pay | Admitting: Infectious Disease

## 2016-11-12 DIAGNOSIS — B20 Human immunodeficiency virus [HIV] disease: Secondary | ICD-10-CM

## 2016-11-15 NOTE — Telephone Encounter (Signed)
I called and scheduled the patients injection.

## 2016-11-16 ENCOUNTER — Other Ambulatory Visit: Payer: Self-pay | Admitting: Infectious Disease

## 2016-11-17 ENCOUNTER — Encounter: Payer: Self-pay | Admitting: Sports Medicine

## 2016-11-17 ENCOUNTER — Ambulatory Visit (INDEPENDENT_AMBULATORY_CARE_PROVIDER_SITE_OTHER): Payer: Medicaid Other | Admitting: Sports Medicine

## 2016-11-17 DIAGNOSIS — M25552 Pain in left hip: Secondary | ICD-10-CM | POA: Diagnosis not present

## 2016-11-17 DIAGNOSIS — M4316 Spondylolisthesis, lumbar region: Secondary | ICD-10-CM | POA: Diagnosis not present

## 2016-11-17 DIAGNOSIS — M25551 Pain in right hip: Secondary | ICD-10-CM | POA: Diagnosis not present

## 2016-11-17 MED ORDER — DICLOFENAC SODIUM 1 % TD GEL: 4.0000 g | Freq: Four times a day (QID) | TRANSDERMAL | 11 refills | Status: DC

## 2016-11-18 DIAGNOSIS — M25551 Pain in right hip: Secondary | ICD-10-CM | POA: Insufficient documentation

## 2016-11-18 DIAGNOSIS — M25552 Pain in left hip: Secondary | ICD-10-CM

## 2016-11-18 NOTE — Assessment & Plan Note (Signed)
Needs to start some exercises to strengthen pelvic core Discussed HEP to help RT her prior gym workouts  Obesity and pelvic sway likely trigger the pain  Top Voltaren seems to help

## 2016-11-18 NOTE — Progress Notes (Signed)
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

## 2016-11-18 NOTE — Assessment & Plan Note (Signed)
WE may need to recheck XR on RTC to assiss if stable Needs to RT flexion exercise program

## 2016-12-07 ENCOUNTER — Telehealth: Payer: Self-pay | Admitting: Neurology

## 2016-12-07 ENCOUNTER — Other Ambulatory Visit: Payer: Self-pay | Admitting: *Deleted

## 2016-12-07 MED ORDER — DILTIAZEM HCL 60 MG PO TABS: 60.0000 mg | ORAL_TABLET | Freq: Two times a day (BID) | ORAL | 6 refills | Status: DC

## 2016-12-07 NOTE — Telephone Encounter (Addendum)
Called the pharmacy - her last refill was 11/12/16 so it is too soon to fill the prescription.  The patient is aware.

## 2016-12-07 NOTE — Telephone Encounter (Signed)
Patient aware and agreeable to try the new medication.

## 2016-12-07 NOTE — Telephone Encounter (Signed)
Have add on Cardizem 60 mg twice a day as headache prevention.

## 2016-12-07 NOTE — Telephone Encounter (Signed)
Have add on Cardizem 60 mg twice a day as headache prevention,

## 2016-12-07 NOTE — Telephone Encounter (Signed)
Patient needs per [facility] PA for zolmitriptan (ZOMIG) 5 MG tablet. Patient is going out of town in 2 days.

## 2016-12-09 ENCOUNTER — Encounter: Payer: Self-pay | Admitting: *Deleted

## 2016-12-13 ENCOUNTER — Telehealth: Payer: Self-pay | Admitting: Neurology

## 2016-12-13 ENCOUNTER — Other Ambulatory Visit: Payer: Self-pay | Admitting: Neurology

## 2016-12-13 NOTE — Telephone Encounter (Signed)
RN call [facility]. Rn stated pt call stating compazine is requiring a PA. The pharmacy tech stated she has no message its requiring a PA. She stated the pt can pick her medicine up now. Rn will let pt know that.

## 2016-12-13 NOTE — Telephone Encounter (Signed)
Rn call patient back about her compazine medication needing a PA. PT stated " I did not call about compazine I call about the zomig." It was approve in January so im not sure why it needs a PA. Pt stated she call about the zomig needing a PA. Pt stated she last got her [place] refill on Nov 12, 2016. Pt call today and was told it requires a PA. Rn request pt call on June 13, which is a day after about refilling her z[place]. Pt verbalized understanding of her z[place], and compazine. Rn stated to patient she has compazine refills till May 2019.

## 2016-12-13 NOTE — Telephone Encounter (Signed)
Pt said prochlorperazine (COMPAZINE) 10 MG tablet needs PA.

## 2016-12-14 NOTE — Telephone Encounter (Signed)
Patient calling back stating she called [facility] on [address] in [place] today and they say PA is needed for Compazine.

## 2016-12-14 NOTE — Telephone Encounter (Signed)
Spoke to pharmacist at [facility] who said that Compazine refill is ready to be picked up. Zomig PA is pending. Called and left VM mssg to notify pt.

## 2016-12-19 ENCOUNTER — Other Ambulatory Visit: Payer: Medicaid Other

## 2016-12-21 ENCOUNTER — Telehealth: Payer: Self-pay | Admitting: *Deleted

## 2016-12-21 ENCOUNTER — Other Ambulatory Visit: Payer: Medicaid Other

## 2016-12-21 DIAGNOSIS — B20 Human immunodeficiency virus [HIV] disease: Secondary | ICD-10-CM

## 2016-12-21 DIAGNOSIS — Z113 Encounter for screening for infections with a predominantly sexual mode of transmission: Secondary | ICD-10-CM

## 2016-12-21 DIAGNOSIS — Z79899 Other long term (current) drug therapy: Secondary | ICD-10-CM

## 2016-12-21 LAB — CBC WITH DIFFERENTIAL/PLATELET
Basophils Absolute: 0 cells/uL (ref 0–200)
Basophils Relative: 0 %
EOS PCT: 2 %
Eosinophils Absolute: 128 cells/uL (ref 15–500)
HCT: 39.2 % (ref 35.0–45.0)
HEMOGLOBIN: 12.5 g/dL (ref 11.7–15.5)
LYMPHS ABS: 2560 {cells}/uL (ref 850–3900)
Lymphocytes Relative: 40 %
MCH: 25.5 pg — AB (ref 27.0–33.0)
MCHC: 31.9 g/dL — AB (ref 32.0–36.0)
MCV: 80 fL (ref 80.0–100.0)
MONOS PCT: 6 %
MPV: 8.3 fL (ref 7.5–12.5)
Monocytes Absolute: 384 cells/uL (ref 200–950)
NEUTROS ABS: 3328 {cells}/uL (ref 1500–7800)
NEUTROS PCT: 52 %
PLATELETS: 191 10*3/uL (ref 140–400)
RBC: 4.9 MIL/uL (ref 3.80–5.10)
RDW: 15.8 % — ABNORMAL HIGH (ref 11.0–15.0)
WBC: 6.4 10*3/uL (ref 3.8–10.8)

## 2016-12-21 NOTE — Telephone Encounter (Signed)
PA for zolmitriptan approved by [facility] Medicaid [phone]) through 12/15/17.  IT#[id number].  PT FO#[id number].  Pharmacy has been notified and will fill the medication for patient.

## 2016-12-22 LAB — T-HELPER CELL (CD4) - (RCID CLINIC ONLY)
CD4 % Helper T Cell: 35 % (ref 33–55)
CD4 T CELL ABS: 1020 /uL (ref 400–2700)

## 2016-12-22 LAB — LIPID PANEL
Cholesterol: 253 mg/dL — ABNORMAL HIGH (ref <200)
HDL: 37 mg/dL — AB (ref >50)
Total CHOL/HDL Ratio: 6.8 Ratio — ABNORMAL HIGH (ref <5.0)
Triglycerides: 443 mg/dL — ABNORMAL HIGH (ref <150)

## 2016-12-22 LAB — RPR

## 2016-12-23 LAB — HIV-1 RNA QUANT-NO REFLEX-BLD
HIV 1 RNA QUANT: DETECTED {copies}/mL — AB
HIV-1 RNA QUANT, LOG: DETECTED {Log_copies}/mL — AB

## 2017-01-02 ENCOUNTER — Ambulatory Visit: Payer: Medicaid Other | Admitting: Infectious Disease

## 2017-01-05 ENCOUNTER — Encounter: Payer: Self-pay | Admitting: Infectious Disease

## 2017-01-05 ENCOUNTER — Ambulatory Visit (INDEPENDENT_AMBULATORY_CARE_PROVIDER_SITE_OTHER): Payer: Medicaid Other | Admitting: Infectious Disease

## 2017-01-05 VITALS — BP 126/81 | HR 80 | Temp 97.9°F | Ht 66.0 in | Wt 306.0 lb

## 2017-01-05 DIAGNOSIS — E782 Mixed hyperlipidemia: Secondary | ICD-10-CM

## 2017-01-05 DIAGNOSIS — B20 Human immunodeficiency virus [HIV] disease: Secondary | ICD-10-CM

## 2017-01-05 DIAGNOSIS — L0292 Furuncle, unspecified: Secondary | ICD-10-CM | POA: Diagnosis not present

## 2017-01-05 DIAGNOSIS — Z23 Encounter for immunization: Secondary | ICD-10-CM

## 2017-01-05 HISTORY — DX: Furuncle, unspecified: L02.92

## 2017-01-05 HISTORY — DX: Mixed hyperlipidemia: E78.2

## 2017-01-05 MED ORDER — DOXYCYCLINE HYCLATE 100 MG PO TABS: 100.0000 mg | ORAL_TABLET | Freq: Two times a day (BID) | ORAL | 1 refills | Status: DC

## 2017-01-05 NOTE — Progress Notes (Signed)
Chief complaints: Boils on her buttocks and also concerns about her cholesterol Subjective:    Patient ID: [name], female    DOB: 03/30/1966, 51 y.o.   MRN: [id number]  HPI  [name] is a 51 y.o. female who is doing superbly well on her  antiviral regimen, Prezcobix and Descovy with undetectable viral load and health cd4 count.    Lab Results  Component Value Date   HIV1RNAQUANT <20 DETECTED (A) 12/21/2016   HIV1RNAQUANT <20 NOT DETECTED 07/18/2016   HIV1RNAQUANT <20 10/06/2015    Lab Results  Component Value Date   CD4TABS 1,020 12/21/2016   CD4TABS 760 07/18/2016   CD4TABS 590 10/06/2015      Past Medical History:  Diagnosis Date  . Acute gastritis without mention of hemorrhage 01/01/2002  . Anemia 10/2009   macrocytic anemia with baseline MCV 104-106  . Anxiety   . Asthma   . 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 Complex tear of medial meniscus of left knee as current injury 02/25/2016  . Complication of anesthesia    hard to wake up  . COPD (chronic obstructive pulmonary disease) (HCC)    chronic bronchitis  . Derangement of anterior horn of lateral meniscus of left knee 02/25/2016  . Duodenitis without mention of hemorrhage 01/01/2002  . DVT (deep venous thrombosis) (HCC)    history clot lt groin when she had lymphoma-in remision now.  . Gastroparesis   . GERD (gastroesophageal reflux disease)   . Hiatal hernia 01/01/2002  . HIV infection ([place])    CD4 = 570 (05/2010), VL undetectation  . Hyperlipidemia   . IBS (irritable bowel syndrome)   . Migraines    on topamax and triptans, frequent (almost daily) attacks   . Non Hodgkin's lymphoma ([place])    stage II, s/p resection, chemotherapy, radiotherapy, Dr. Beryle Beams is her oncologist.   . PUD (peptic ulcer disease) 07/1997   per EGD report 07/1997 with history of esophagitis  . Restless leg syndrome 12/24/2015  . Shingles    in lumbar dermatome  . Spondylolisthesis of lumbar region    L5-S1  . TMJ  (dislocation of temporomandibular joint)   . Traumatic tear of lateral meniscus of right knee 04/20/2012   Past Surgical History:  Procedure Laterality Date  . CHOLECYSTECTOMY    . ENDOMETRIAL ABLATION    . KNEE ARTHROSCOPY Right 04/20/12  . KNEE ARTHROSCOPY WITH LATERAL MENISECTOMY Left 02/25/2016   Procedure: LEFT KNEE ARTHROSCOPY WITH MEDIAL AND  LATERAL MENISECTOMIES;  Surgeon: Marchia Bond, MD;  Location: [facility];  Service: Orthopedics;  Laterality: Left;  . KNEE ARTHROSCOPY WITH MEDIAL MENISECTOMY Left 02/25/2016   Procedure: KNEE ARTHROSCOPY WITH MEDIAL MENISECTOMY;  Surgeon: Marchia Bond, MD;  Location: [facility];  Service: Orthopedics;  Laterality: Left;  . TONSILLECTOMY    . TYMPANOPLASTY Left   . TYMPANOSTOMY TUBE PLACEMENT     as child-both  . UPPER GASTROINTESTINAL ENDOSCOPY  2003, 2007   done by Dr. Velora Heckler   Family History  Problem Relation Age of Onset  . Hypertension Mother   . Stroke Mother   . Heart disease Mother   . Breast cancer Paternal Grandmother   . Kidney cancer Maternal Uncle   . Heart disease Maternal Uncle   . Diabetes Maternal Uncle   . Diabetes Maternal Grandmother   . Heart disease Maternal Grandmother   . Emphysema Paternal Uncle        never smoker  . Emphysema Paternal  Grandfather        never smoker  . Colon cancer Paternal Aunt        with met to lung   . Non-Hodgkin's lymphoma Maternal Uncle   . Heart disease Maternal Grandfather    Social History  Substance Use Topics  . Smoking status: Never Smoker  . Smokeless tobacco: Never Used  . Alcohol use No   Allergies  Allergen Reactions  . Diazepam     Triggers migraines  . Divalproex Sodium Nausea And Vomiting    Causes light-headedness  . Gabapentin Nausea And Vomiting    confusion  . Ondansetron Hcl Nausea And Vomiting    Triggers migraines, "makes me vomit"  . Penicillins     REACTION: lips swollen to point of bleeding, sever vaginal  irritation  Reports tolerating augmentin well without any complaints or reaction  . Pineapple   . Propoxyphene N-Acetaminophen Nausea And Vomiting  . Simvastatin     PATIENT CANNOT BE PRESCRIBED THIS STATIN WHILE RECEIVING NORVIR  . Topamax [Topiramate]     Migraines   . Tramadol Hcl Itching    REACTION: migraines and itching--hx of migraines even without medication   . Zocor [Simvastatin - High Dose]      Current Outpatient Prescriptions:  .  Beclomethasone Dipropionate 80 MCG/ACT AERS, Place 2 Inhalers into the nose daily., Disp: 8.7 g, Rfl: 10 .  Boric Acid POWD, 600 mg per vagina for 7 days, Disp: 25000 g, Rfl: 6 .  BOTOX 100 units SOLR injection, MD IS TO INJECT 200 UNITS INTRAMUSCULARLY INTO HEAD AND NECK MUSCLES EVERY 3 MONTHS, Disp: 2 each, Rfl: 3 .  Calcium Carbonate-Vitamin D (CALCIUM-VITAMIN D) 500-200 MG-UNIT per tablet, Take 2 tablets by mouth daily. , Disp: , Rfl:  .  clotrimazole (MYCELEX) 10 MG troche, Take 10 mg by mouth., Disp: , Rfl:  .  DESCOVY 200-25 MG tablet, TAKE 1 TABLET BY MOUTH EVERY DAY, Disp: 30 tablet, Rfl: 3 .  diclofenac sodium (VOLTAREN) 1 % GEL, Apply 4 g topically 4 (four) times daily., Disp: 1 Tube, Rfl: 11 .  diltiazem (CARDIZEM) 60 MG tablet, Take 1 tablet (60 mg total) by mouth 2 (two) times daily., Disp: 60 tablet, Rfl: 6 .  DULERA 100-5 MCG/ACT AERO, USE 2 PUFFS BY MOUTH THE FIRST THING IN THE MORNING AND THEN ANOTHER 2 PUFFS ABOUT 12 HOURS LATER, Disp: 13 g, Rfl: 5 .  fluconazole (DIFLUCAN) 100 MG tablet, TAKE 1 TABLET BY MOUTH EVERY DAY, Disp: 14 tablet, Rfl: 3 .  fluocinonide (LIDEX) 0.05 % external solution, 1 gtt AU qD prn itching, Disp: , Rfl:  .  FLUoxetine (PROZAC) 40 MG capsule, TAKE 1 CAPSULE BY MOUTH DAILY, Disp: 30 capsule, Rfl: 2 .  fluticasone (FLONASE) 50 MCG/ACT nasal spray, Place 2 sprays into both nostrils daily., Disp: , Rfl:  .  HYDROmorphone (DILAUDID) 2 MG tablet, Take 1 tablet (2 mg total) by mouth every 4 (four) hours as  needed for severe pain., Disp: 50 tablet, Rfl: 0 .  NEXIUM 40 MG capsule, TAKE 1 CAPSULE BY MOUTH 2 TIMES DAILY BEFORE A MEAL, Disp: 60 capsule, Rfl: 0 .  pramipexole (MIRAPEX) 0.25 MG tablet, Take 3 tablets (0.75 mg total) by mouth at bedtime., Disp: 90 tablet, Rfl: 11 .  PREZCOBIX 800-150 MG tablet, TAKE 1 TABLET BY MOUTH EVERY DAY, Disp: 30 tablet, Rfl: 5 .  PREZCOBIX 800-150 MG tablet, TAKE 1 TABLET BY MOUTH EVERY DAY, Disp: 30 tablet, Rfl: 1 .  PROAIR  HFA 108 (90 Base) MCG/ACT inhaler, USE 1 TO 2 PUFFS EVERY 4 TO 6 HOURS AS NEEDED SHORTNESS OF BREATH OR WHEEZING, Disp: 8.5 g, Rfl: 1 .  prochlorperazine (COMPAZINE) 10 MG tablet, Take 1 tablet (10 mg total) by mouth every 6 (six) hours as needed for nausea or vomiting., Disp: 30 tablet, Rfl: 11 .  scopolamine (TRANSDERM-SCOP, 1.5 MG,) 1 MG/3DAYS, Place 1 patch (1.5 mg total) onto the skin every 3 (three) days., Disp: 10 patch, Rfl: 11 .  Tamsulosin HCl (FLOMAX) 0.4 MG CAPS, Take 0.4 mg by mouth daily. 1-2 daily, Disp: , Rfl:  .  tiZANidine (ZANAFLEX) 4 MG tablet, Take 1 tablet (4 mg total) by mouth 2 (two) times daily as needed for muscle spasms., Disp: 60 tablet, Rfl: 11 .  zolmitriptan (ZOMIG) 5 MG tablet, Take 1 tab at onset of migraine.  May repeat, with 1 tab, in 2 hrs, if needed.  Max dose of 2 tabs/24hrs.  Must last 30 days. #12/30, Disp: 12 tablet, Rfl: 11     Review of Systems  Constitutional: Negative for activity change, appetite change, chills, fever and unexpected weight change.  HENT: Negative for congestion, facial swelling, postnasal drip, rhinorrhea, sinus pressure, sneezing, sore throat and trouble swallowing.   Eyes: Negative for photophobia and visual disturbance.  Respiratory: Negative for apnea, cough, chest tightness, shortness of breath, wheezing and stridor.   Cardiovascular: Negative for chest pain and leg swelling.  Gastrointestinal: Negative for abdominal distention, abdominal pain, anal bleeding, blood in stool,  constipation, diarrhea, nausea and vomiting.  Genitourinary: Negative for difficulty urinating, dysuria, flank pain and hematuria.  Musculoskeletal: Negative for arthralgias, gait problem and joint swelling.  Skin: Positive for rash. Negative for color change, pallor and wound.  Neurological: Positive for headaches. Negative for dizziness, tremors, weakness and light-headedness.  Hematological: Negative for adenopathy. Does not bruise/bleed easily.  Psychiatric/Behavioral: Positive for dysphoric mood. Negative for agitation, behavioral problems, confusion, hallucinations, self-injury, sleep disturbance and suicidal ideas. The patient is not hyperactive.        Objective:   Physical Exam  Constitutional: She is oriented to person, place, and time. She appears well-developed and well-nourished. No distress.  HENT:  Head: Normocephalic and atraumatic.  Mouth/Throat: Oropharynx is clear and moist. No oropharyngeal exudate, posterior oropharyngeal edema, posterior oropharyngeal erythema or tonsillar abscesses.  Eyes: Conjunctivae and EOM are normal. Pupils are equal, round, and reactive to light. No scleral icterus.  Neck: Normal range of motion. Neck supple. No JVD present.  Cardiovascular: Normal rate, regular rhythm and normal heart sounds.  Exam reveals no gallop and no friction rub.   No murmur heard. Pulmonary/Chest: Effort normal and breath sounds normal. No respiratory distress. She has no wheezes. She has no rales. She exhibits no tenderness.  Abdominal: She exhibits no distension and no mass. There is no tenderness. There is no rebound and no guarding.  Musculoskeletal: She exhibits no edema or tenderness.  Lymphadenopathy:    She has no cervical adenopathy.  Neurological: She is alert and oriented to person, place, and time. She has normal reflexes. She exhibits normal muscle tone. Coordination normal.  Skin: Skin is warm and dry. She is not diaphoretic. No erythema. No pallor.   Psychiatric: She has a normal mood and affect. Her speech is normal. Judgment and thought content normal.  Nursing note and vitals reviewed.         Assessment & Plan:  HIV: continue  Prezcobix and to Descovy --> DRV STR when FDA approved  and covered  Boils: She would not let me examine them I encouraged warm compresses I gave her prescription for doxycycline.  Hypertriglyceridemia: She had trouble tolerating statins and apparently fib rates not sure she has tried niacin I asked for her to meet with Onnie Boer from [place]. She would not let PCP manage  Migraines: followed closely by Neurology  Chronic pain: being followed by [facility]  I spent greater than 25  minutes with the patient including greater than 50% of time in face to face counsel of the patient re her HIV,hypertriglyceridiemia, boils and in coordination of her care.

## 2017-01-06 NOTE — Addendum Note (Signed)
Addended by: Roma Kayser on: 01/06/2017 12:18 PM   Modules accepted: Orders

## 2017-01-17 ENCOUNTER — Ambulatory Visit: Payer: Medicaid Other | Admitting: Sports Medicine

## 2017-02-13 ENCOUNTER — Other Ambulatory Visit: Payer: Self-pay | Admitting: Infectious Disease

## 2017-02-13 DIAGNOSIS — B20 Human immunodeficiency virus [HIV] disease: Secondary | ICD-10-CM

## 2017-02-22 ENCOUNTER — Ambulatory Visit (INDEPENDENT_AMBULATORY_CARE_PROVIDER_SITE_OTHER): Payer: Medicaid Other | Admitting: Neurology

## 2017-02-22 ENCOUNTER — Encounter: Payer: Self-pay | Admitting: Neurology

## 2017-02-22 VITALS — BP 141/96 | HR 94 | Ht 66.0 in | Wt 296.5 lb

## 2017-02-22 DIAGNOSIS — G43719 Chronic migraine without aura, intractable, without status migrainosus: Secondary | ICD-10-CM

## 2017-02-22 NOTE — Progress Notes (Signed)
**  Botox 100 units, NDC 4360-1658-00, Lot [id number], Exp 07/2019, specialty pharmacy.//mck,rn**

## 2017-02-22 NOTE — Progress Notes (Signed)
PATIENT: [name] DOB: Oct 23, 1965  Chief Complaint  Patient presents with  . Migraine    Botox 100 units x 2 vials - specialty pharmacy.  Reports stopping Cardizem due to dizziness and drowsiness.     HISTORICAL  [name], Is accompanied by her sister returned for Botox injection as migraine prevention  She has past medical history of HIV, hyperlipidemia, migraines, depression, and non-Hodgkin lymphoma in 2005 with chemotherapy and radiation.   She has history of migraines since 19. In the last 10 years, she reports having daily migraines. She is currently taking Topamax and Zomig with relief. She takes approximately 20-25 tablets of her Zomig per month.    She has aura's that look like floating dirt particles, flashing lights, and she sees Christmas tree lights when they are severe, she has nausea, vomiting and sometimes diarrhea when they're severe. She also complains of diplopia, blurry vision, and  photophobia. She also has had a lightning bolt sensation throughout her body.  She describes them as pulsating and throbbing  on a scale of 5-6/10 which she considers mild, when they are severe they can go as high as 10/10. They usually start in the right temporal area, but will also start on the left temporal and are worse.   Any type of smoke or MSG can precipitate a migraine. They generally last 2-9 days. She has been to the emergency department twice in the last year for migraines. She states "I just can't do this anymore I need to do something about her migraines"  She has tried and failed different preventive medications:  atenolol, verapamil, nortriptyline, Inderal, Depakote without benefit. Atenolol and verapamil caused hypotension. Depakote caused severe vomiting. Topamax causing her worsening headaches, will show tried Zonegran, Topamax ER, she denies significant improvement.  For abortive treatment, she has tried Imitrex, Maxalt with suboptimal response, Zomig as needed  since to work best,  she also takes transderm scophalamine patch for nause  I started to give her BOTOX injection since May 2013, every 3 months, she responded very well,   Previous MRI brain was normal in 2013.  Last Botox injection as migraine prevention was March 2017, she responded very well, but in recent few weeks, she noticed increased migraine headaches, this baby had headache left retro-orbital area severe pounding headache with associated light noise sensitivity, has been ongoing for 3 days, failed to respond to Zomig by mouth, Imitrex injection as needed, she has constant dry heaves, difficulty opening her eyes, In addition, today she complains of restless leg symptoms, difficult to stand or lying still for extended period of time, has tried Neurontin-cause nausea, confusion,  Lyrica-cause excessive weight gain, she has never tried dopamine agonist in the past  I reviewed laboratory in April 2017, normal hemoglobin 12 point 9, mild elevated RDW 15.8, decreased MCV   Update April 27 2016  Last Botox injection was in June 2017, she had left knee arthroscopic surgery in August 2017, complaining of significant joints pain, unsteady gait, before Botox she was having moderate to severe headache daily, even Botox now, she used to 12 tablets of Zomig every months because of frequent moderate to severe headache  Update August 10 2016: Last Botox injection was in October 2017, has been very helpful, but over the past 3-4 weeks, she had frequent almost daily headaches, oftentimes wake her up from sleep, Zomig has been very helpful,  Low dose Mirapex 0.25 milligrams 2 tablets every night has been very helpful for  her restless leg symptoms, she reported 80% improvement, laboratory reviewed in January 2018, CD4 count 760, normal hemoglobin 12.3, mildly decreased MCV, increased RDW,  Update Nov 09 2016: She suffered bilateral ear infection, strep throat since last visit, had frequent headaches,  nausea,  Update February 22 2017: She responded well to previous injection, complains of a lot of stress at home  REVIEW OF SYSTEMS: Full 14 system review of systems performed and notable only for as above  ALLERGIES: Allergies  Allergen Reactions  . Diazepam     Triggers migraines  . Divalproex Sodium Nausea And Vomiting    Causes light-headedness  . Gabapentin Nausea And Vomiting    confusion  . Ondansetron Hcl Nausea And Vomiting    Triggers migraines, "makes me vomit"  . Penicillins     REACTION: lips swollen to point of bleeding, sever vaginal irritation  Reports tolerating augmentin well without any complaints or reaction  . Pineapple   . Propoxyphene N-Acetaminophen Nausea And Vomiting  . Simvastatin     PATIENT CANNOT BE PRESCRIBED THIS STATIN WHILE RECEIVING NORVIR  . Topamax [Topiramate]     Migraines   . Tramadol Hcl Itching    REACTION: migraines and itching--hx of migraines even without medication   . Zocor [Simvastatin - High Dose]     HOME MEDICATIONS: Current Outpatient Prescriptions  Medication Sig Dispense Refill  . Beclomethasone Dipropionate 80 MCG/ACT AERS Place 2 Inhalers into the nose daily. 8.7 g 10  . Boric Acid POWD 600 mg per vagina for 7 days 25000 g 6  . BOTOX 100 units SOLR injection MD IS TO INJECT 200 UNITS INTRAMUSCULARLY INTO HEAD AND NECK MUSCLES EVERY 3 MONTHS 2 each 3  . Calcium Carbonate-Vitamin D (CALCIUM-VITAMIN D) 500-200 MG-UNIT per tablet Take 2 tablets by mouth daily.     . clotrimazole (MYCELEX) 10 MG troche Take 10 mg by mouth.    . DESCOVY 200-25 MG tablet TAKE 1 TABLET BY MOUTH EVERY DAY 30 tablet 3  . diclofenac sodium (VOLTAREN) 1 % GEL Apply 4 g topically 4 (four) times daily. 1 Tube 11  . doxycycline (VIBRA-TABS) 100 MG tablet Take 1 tablet (100 mg total) by mouth 2 (two) times daily. 20 tablet 1  . DULERA 100-5 MCG/ACT AERO USE 2 PUFFS BY MOUTH THE FIRST THING IN THE MORNING AND THEN ANOTHER 2 PUFFS ABOUT 12 HOURS LATER  13 g 5  . fluconazole (DIFLUCAN) 100 MG tablet TAKE 1 TABLET BY MOUTH EVERY DAY 14 tablet 3  . fluocinonide (LIDEX) 0.05 % external solution 1 gtt AU qD prn itching    . FLUoxetine (PROZAC) 40 MG capsule TAKE 1 CAPSULE BY MOUTH DAILY 30 capsule 2  . fluticasone (FLONASE) 50 MCG/ACT nasal spray Place 2 sprays into both nostrils daily.    Marland Kitchen HYDROmorphone (DILAUDID) 2 MG tablet Take 1 tablet (2 mg total) by mouth every 4 (four) hours as needed for severe pain. 50 tablet 0  . NEXIUM 40 MG capsule TAKE 1 CAPSULE BY MOUTH 2 TIMES DAILY BEFORE A MEAL 60 capsule 0  . pramipexole (MIRAPEX) 0.25 MG tablet Take 3 tablets (0.75 mg total) by mouth at bedtime. 90 tablet 11  . PREZCOBIX 800-150 MG tablet TAKE 1 TABLET BY MOUTH EVERY DAY 30 tablet 5  . PREZCOBIX 800-150 MG tablet TAKE 1 TABLET BY MOUTH EVERY DAY 30 tablet 5  . PROAIR HFA 108 (90 Base) MCG/ACT inhaler USE 1 TO 2 PUFFS EVERY 4 TO 6 HOURS AS NEEDED SHORTNESS  OF BREATH OR WHEEZING 8.5 g 1  . prochlorperazine (COMPAZINE) 10 MG tablet Take 1 tablet (10 mg total) by mouth every 6 (six) hours as needed for nausea or vomiting. 30 tablet 11  . scopolamine (TRANSDERM-SCOP, 1.5 MG,) 1 MG/3DAYS Place 1 patch (1.5 mg total) onto the skin every 3 (three) days. 10 patch 11  . Tamsulosin HCl (FLOMAX) 0.4 MG CAPS Take 0.4 mg by mouth daily. 1-2 daily    . tiZANidine (ZANAFLEX) 4 MG tablet Take 1 tablet (4 mg total) by mouth 2 (two) times daily as needed for muscle spasms. 60 tablet 11  . zolmitriptan (ZOMIG) 5 MG tablet Take 1 tab at onset of migraine.  May repeat, with 1 tab, in 2 hrs, if needed.  Max dose of 2 tabs/24hrs.  Must last 30 days. #12/30 12 tablet 11   No current facility-administered medications for this visit.     PAST MEDICAL HISTORY: Past Medical History:  Diagnosis Date  . Acute gastritis without mention of hemorrhage 01/01/2002  . Anemia 10/2009   macrocytic anemia with baseline MCV 104-106  . Anxiety   . Asthma   . Boil 01/05/2017  .  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 Complex tear of medial meniscus of left knee as current injury 02/25/2016  . Complication of anesthesia    hard to wake up  . COPD (chronic obstructive pulmonary disease) (HCC)    chronic bronchitis  . Derangement of anterior horn of lateral meniscus of left knee 02/25/2016  . Duodenitis without mention of hemorrhage 01/01/2002  . DVT (deep venous thrombosis) (HCC)    history clot lt groin when she had lymphoma-in remision now.  . Elevated triglycerides with high cholesterol 01/05/2017  . Gastroparesis   . GERD (gastroesophageal reflux disease)   . Hiatal hernia 01/01/2002  . HIV infection ([place])    CD4 = 570 (05/2010), VL undetectation  . Hyperlipidemia   . IBS (irritable bowel syndrome)   . Migraines    on topamax and triptans, frequent (almost daily) attacks   . Non Hodgkin's lymphoma ([place])    stage II, s/p resection, chemotherapy, radiotherapy, Dr. Beryle Beams is her oncologist.   . PUD (peptic ulcer disease) 07/1997   per EGD report 07/1997 with history of esophagitis  . Restless leg syndrome 12/24/2015  . Shingles    in lumbar dermatome  . Spondylolisthesis of lumbar region    L5-S1  . TMJ (dislocation of temporomandibular joint)   . Traumatic tear of lateral meniscus of right knee 04/20/2012    PAST SURGICAL HISTORY: Past Surgical History:  Procedure Laterality Date  . CHOLECYSTECTOMY    . ENDOMETRIAL ABLATION    . KNEE ARTHROSCOPY Right 04/20/12  . KNEE ARTHROSCOPY WITH LATERAL MENISECTOMY Left 02/25/2016   Procedure: LEFT KNEE ARTHROSCOPY WITH MEDIAL AND  LATERAL MENISECTOMIES;  Surgeon: Marchia Bond, MD;  Location: [facility];  Service: Orthopedics;  Laterality: Left;  . KNEE ARTHROSCOPY WITH MEDIAL MENISECTOMY Left 02/25/2016   Procedure: KNEE ARTHROSCOPY WITH MEDIAL MENISECTOMY;  Surgeon: Marchia Bond, MD;  Location: [facility];  Service: Orthopedics;  Laterality: Left;  . TONSILLECTOMY    .  TYMPANOPLASTY Left   . TYMPANOSTOMY TUBE PLACEMENT     as child-both  . UPPER GASTROINTESTINAL ENDOSCOPY  2003, 2007   done by Dr. Velora Heckler    FAMILY HISTORY: Family History  Problem Relation Age of Onset  . Hypertension Mother   . Stroke Mother   . Heart disease Mother   .  Breast cancer Paternal Grandmother   . Kidney cancer Maternal Uncle   . Heart disease Maternal Uncle   . Diabetes Maternal Uncle   . Diabetes Maternal Grandmother   . Heart disease Maternal Grandmother   . Emphysema Paternal Uncle        never smoker  . Emphysema Paternal Grandfather        never smoker  . Colon cancer Paternal Aunt        with met to lung   . Non-Hodgkin's lymphoma Maternal Uncle   . Heart disease Maternal Grandfather     SOCIAL HISTORY:  Social History   Social History  . Marital status: Divorced    Spouse name: N/A  . Number of children: 0  . Years of education: 12   Occupational History  . disabled Unemployed   Social History Main Topics  . Smoking status: Never Smoker  . Smokeless tobacco: Never Used  . Alcohol use No  . Drug use: No  . Sexual activity: Not Currently    Partners: Male    Birth control/ protection: Surgical   Other Topics Concern  . Not on file   Social History Narrative      Patient is disabled.   Right handed.   Caffeine - Diet soda., tea   Education - high school     PHYSICAL EXAM   Vitals:   02/22/17 1344  BP: (!) 141/96  Pulse: 94  Weight: 296 lb 8 oz (134.5 kg)  Height: _0  (1.676 m)    Not recorded      Body mass index is 47.86 kg/m.  PHYSICAL EXAMNIATION:  Gen: NAD, conversant, well nourised, obese, well groomed                     Cardiovascular: Regular rate rhythm, no peripheral edema, warm, nontender. Eyes: Conjunctivae clear without exudates or hemorrhage Neck: Supple, no carotid bruise. Pulmonary: Clear to auscultation bilaterally   NEUROLOGICAL EXAM:  MENTAL STATUS: Speech:    Speech is normal; fluent  and spontaneous with normal comprehension.  Cognition:     Orientation to time, place and person     Normal recent and remote memory     Normal Attention span and concentration     Normal Language, naming, repeating,spontaneous speech     Fund of knowledge   CRANIAL NERVES: CN II: Visual fields are full to confrontation. Fundoscopic exam is normal with sharp discs and no vascular changes. Pupils are round equal and briskly reactive to light. CN III, IV, VI: extraocular movement are normal. No ptosis. CN V: Facial sensation is intact to pinprick in all 3 divisions bilaterally. Corneal responses are intact.  CN VII: Face is symmetric with normal eye closure and smile. CN VIII: Hearing is normal to rubbing fingers CN IX, X: Palate elevates symmetrically. Phonation is normal. CN XI: Head turning and shoulder shrug are intact CN XII: Tongue is midline with normal movements and no atrophy.  MOTOR: There is no pronator drift of out-stretched arms. Muscle bulk and tone are normal. Muscle strength is normal.  REFLEXES: Reflexes are 2+ and symmetric at the biceps, triceps, knees, and ankles. Plantar responses are flexor.  SENSORY: Intact to light touch, pinprick, positional sensation and vibratory sensation are intact in fingers and toes.  COORDINATION: Rapid alternating movements and fine finger movements are intact. There is no dysmetria on finger-to-nose and heel-knee-shin.    GAIT/STANCE: Posture is normal. Gait is steady with normal steps, base, arm  swing, and turning. Heel and toe walking are normal. Tandem gait is normal.  Romberg is absent.  DIAGNOSTIC DATA (LABS, IMAGING, TESTING) - I reviewed patient records, labs, notes, testing and imaging myself where available.   ASSESSMENT AND PLAN  [name] is a 51 y.o. female    History of HIV, hypercholesterolemia, Non hodgkin' lymphoma with chemotherapy and radiation and migraines since age 73.  Chronic migraine headaches She  has tried and failed multiple preventive medications including atenolol, verapamil, nortriptyline, and Depakote,Topamax, Topamax ER, could not tolerate Cardizem,  She has migraines daily for more than  10 years. She is currently taking  zonogram  and Zomig with some relief. Previously tried imitrex and Maxalt with suboptimal response,  Normal MRI brain, and neurological exam.  BOTOX injection was performed according to protocol by [facility]. 100 units of BOTOX was dissolved into 2 cc NS  Used total 200 units  Frontalis 4 sites,  20 units, Temporalis 8 sites,  40 units  Occipitalis 6 sites, 30 units Cervical Paraspinal, 4 sites, 20 units Trapezius, 6 sites, 30 units   30 units were injected along bilateral masseters  30 units were equally divided at bilateral cervical paraspinal muscles.  Also add on Zanaflex 4 mg twice a day as needed  Restless leg symptoms   Continue Mirapex 0.2 5 mg,  3 tablets every night  Ferritin 51 in Feb 2018,     Marcial Pacas, M.D. Ph.D.  [facility] [address] [place], [place] [address] Ph: [phone] Fax: [phone]  CC: Referring Provider

## 2017-03-14 ENCOUNTER — Other Ambulatory Visit: Payer: Self-pay | Admitting: Infectious Disease

## 2017-04-03 IMAGING — MR MR KNEE*L* W/O CM
6 series · 36 of 40 positions shown · non-contrast
Comparison: 10/28/2015

CLINICAL DATA: Knee pain with standing or walking especially
laterally and posterior. Popping and cracking sensation. Duration: 2
months.

EXAM:
MRI OF THE LEFT KNEE WITHOUT CONTRAST
TECHNIQUE: Multiplanar, multisequence MR imaging of the knee was performed. No
intravenous contrast was administered.

[Series 3: PD fat-sat · axial · 3.0mm · 0.33mm/px · z∈[-59,+46]mm · 9 of 33 slices shown (1 of 4)]
[im 1/33]
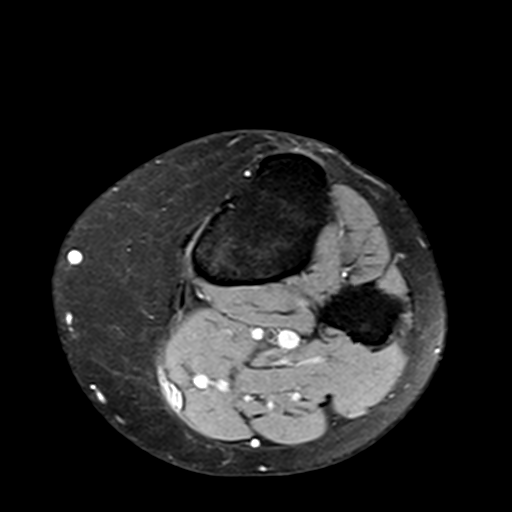
[im 5/33]
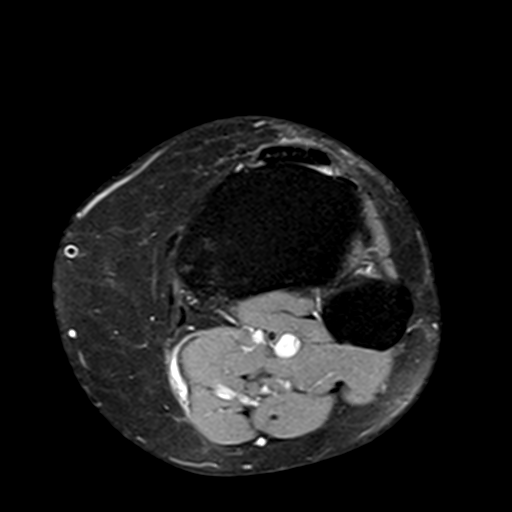
[im 9/33]
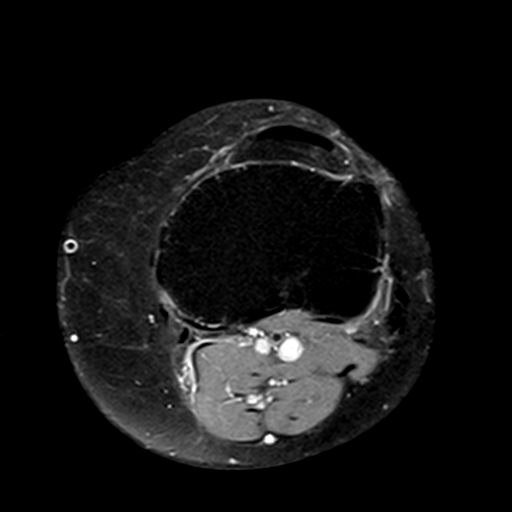
[im 13/33]
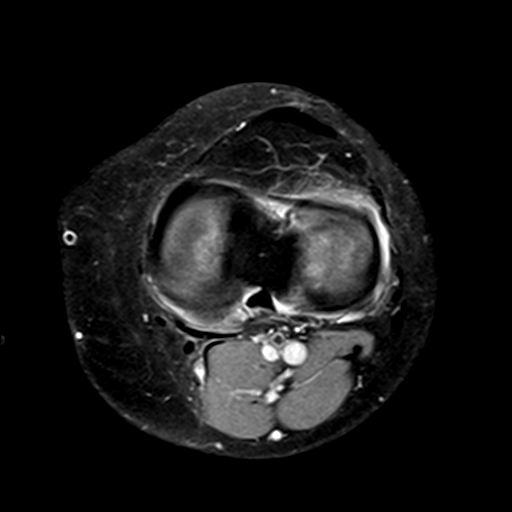
[im 17/33]
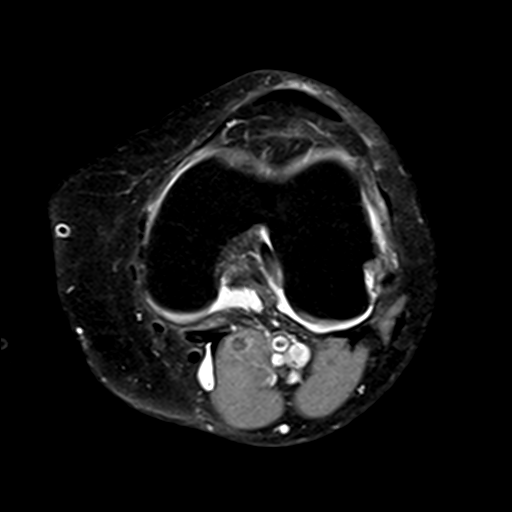
[im 21/33]
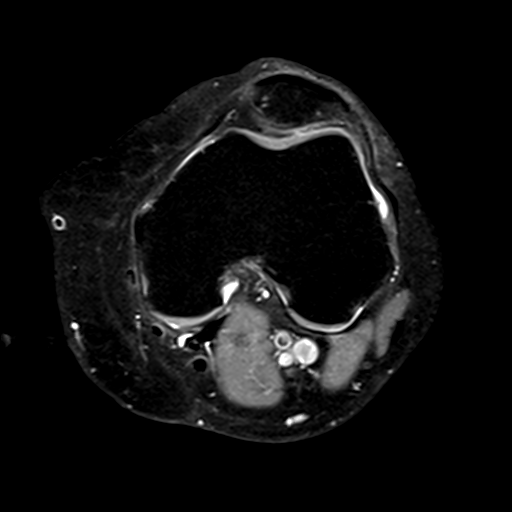
[im 25/33]
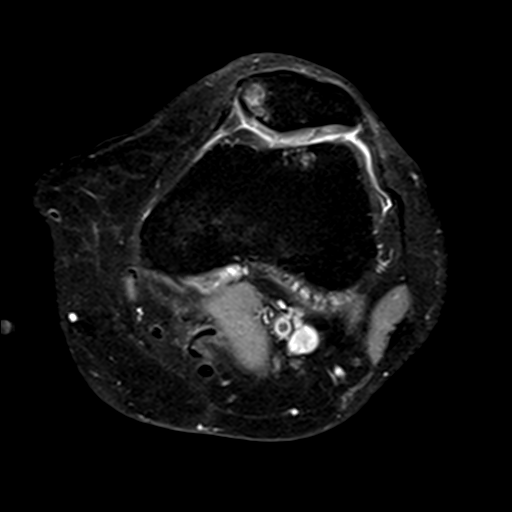
[im 29/33]
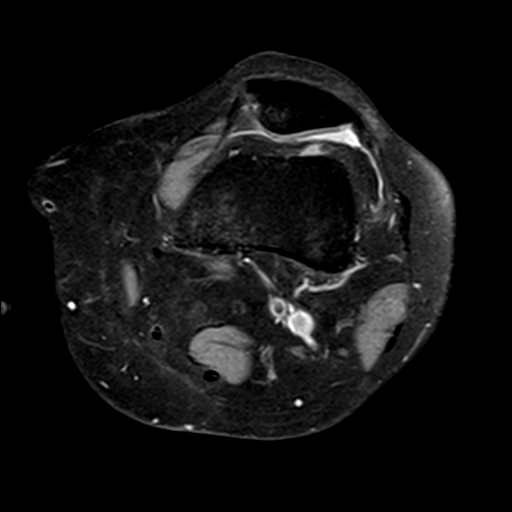
[im 33/33]
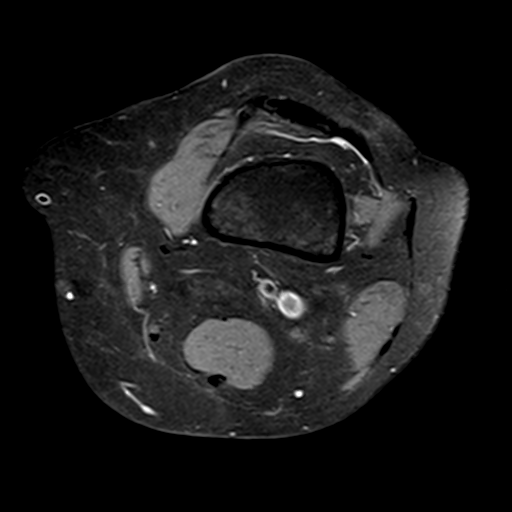

[Series 4: T1 · coronal · 3.0mm · 0.50mm/px · 3 of 31 slices shown]
[im 1/31]
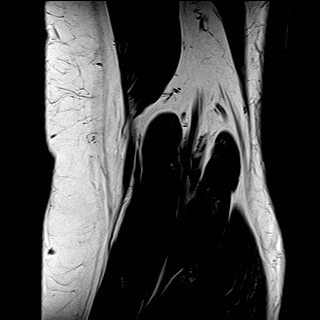
[im 6/31]
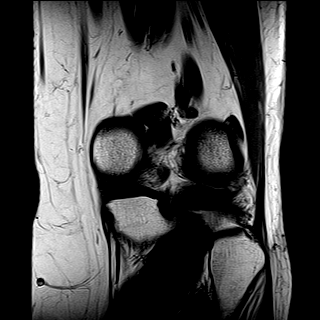
[im 11/31]
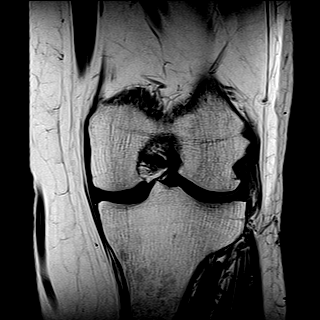

[Series 5: PD fat-sat · sagittal · 3.0mm · 0.62mm/px · 7 of 31 slices shown (2 of 4)]
[im 1/31]
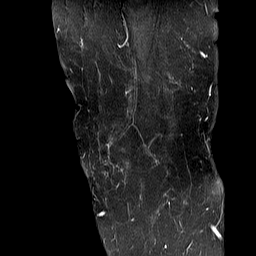
[im 6/31]
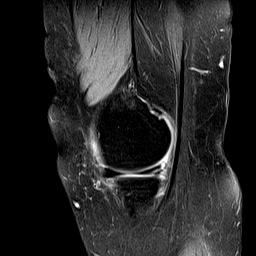
[im 11/31]
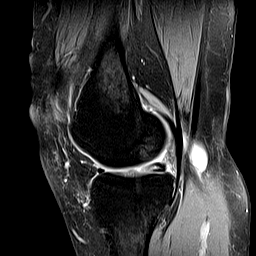
[im 16/31]
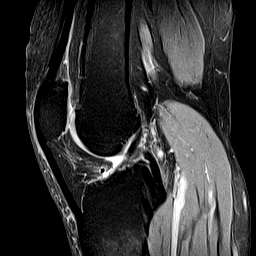
[im 21/31]
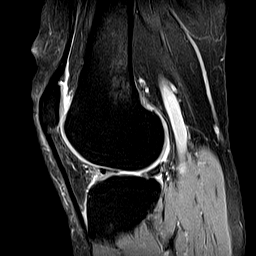
[im 26/31]
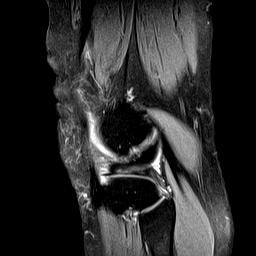
[im 31/31]
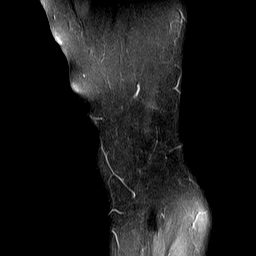

[Series 6: T2 fat-sat · coronal · 3.0mm · 0.50mm/px · 7 of 31 slices shown]
[im 1/31]
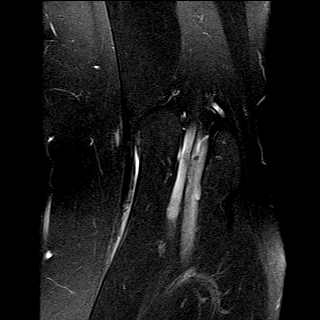
[im 6/31]
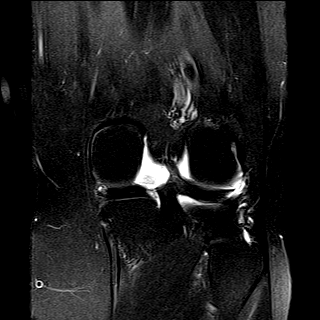
[im 11/31]
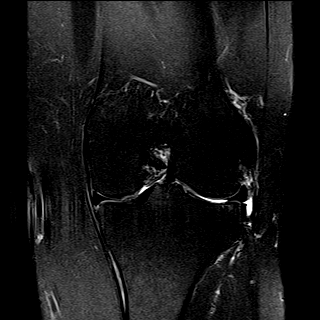
[im 16/31]
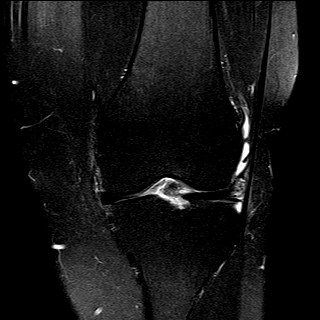
[im 21/31]
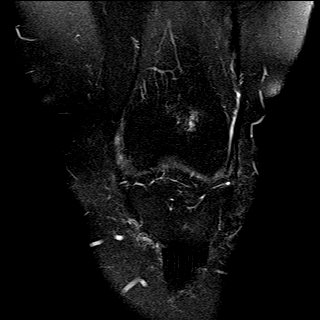
[im 26/31]
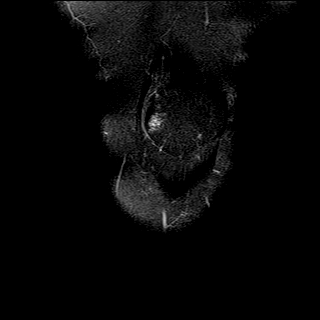
[im 31/31]
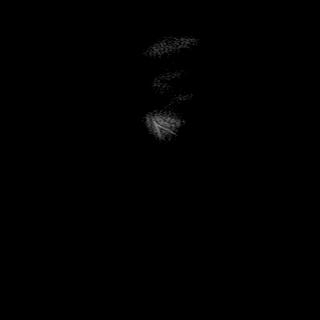

[Series 7: PD fat-sat · coronal · 3.0mm · 0.62mm/px · 7 of 31 slices shown (3 of 4)]
[im 1/31]
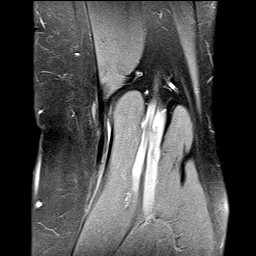
[im 6/31]
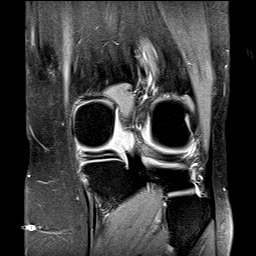
[im 11/31]
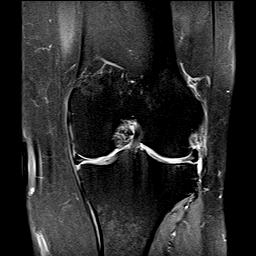
[im 16/31]
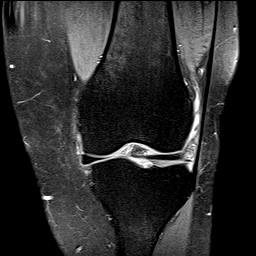
[im 21/31]
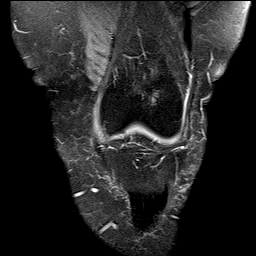
[im 26/31]
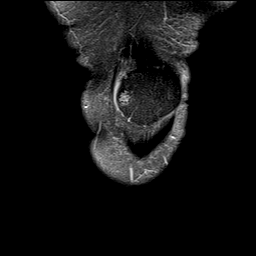
[im 31/31]
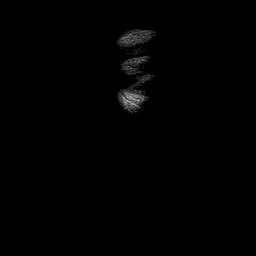

[Series 8: PD fat-sat · coronal · 2.0mm · 0.62mm/px · 3 of 11 slices shown (4 of 4)]
[im 1/11]
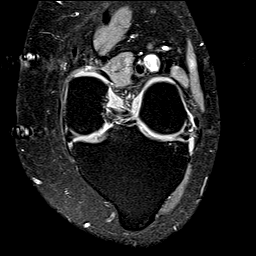
[im 6/11]
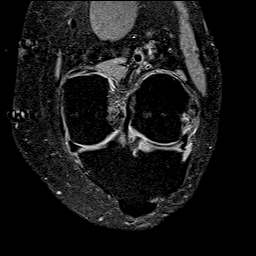
[im 11/11]
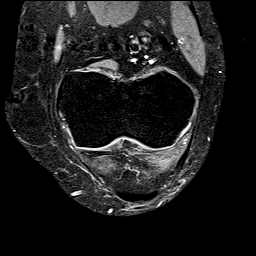

[36 of 40 positions shown; findings below may reference images not displayed]

FINDINGS: MENISCI

Medial meniscus: Grade 3 oblique tear of the posterior horn extends
to the inferior surface. Approaching the meniscal root this tear has
a more horizontal extension into the free edge.

Lateral meniscus: Oblique tear of the anterior horn involving the
inferior meniscal surface, with a small adjacent abnormal flap of
meniscal tissue along the periphery of the meniscus on images 25-26
series 5. This lateral meniscal tear extends in a horizontal fashion
into the midbody free edge, and there is likely some inferior
surface extension in the posterior horn. Near the posterior root of
the meniscus there is amorphous grade 2 signal involving the
inferior surface as on image [DATE].

LIGAMENTS

Cruciates:  Unremarkable

Collaterals:  Mild proximal popliteus tendinopathy.

CARTILAGE

Patellofemoral: Considerable chondral fissuring and irregularity
with grade 3 chondromalacia along the lateral patellar facet and
posterior patellar ridge. Grade 2 chondromalacia and chondral
fissuring along the medial patellar facet. Underlying degenerative
subcortical foci of edema along both the femoral trochlear groove
and patellar facets. Mild chondral thinning in the femoral trochlear
groove.

Medial: Moderate degenerative chondral thinning with chondral
irregularity as on image [DATE].

Lateral:  Moderate to severe degenerative chondral thinning.

Joint:  Small knee effusion.

Popliteal Fossa: Small Baker's cyst, likely partially ruptured with
fluid tracking along the margin of the medial head gastrocnemius.

Extensor Mechanism:  Unremarkable

Bones: No significant extra-articular osseous abnormalities
identified.
IMPRESSION: 1. Torn medial and lateral menisci. Small flap component of the
lateral meniscal tear anteriorly.
2. Considerable patellofemoral chondromalacia with more moderate
chondromalacia and chondral thinning in the medial and lateral
compartments.
3. Small knee effusion with small Baker's cyst.
4. Osteoarthritis with chondral thinning and degenerative
subcortical foci of edema most notable in the patellofemoral joint.

## 2017-04-13 ENCOUNTER — Other Ambulatory Visit: Payer: Self-pay | Admitting: Neurology

## 2017-04-14 ENCOUNTER — Other Ambulatory Visit: Payer: Self-pay | Admitting: Neurology

## 2017-04-24 ENCOUNTER — Ambulatory Visit: Payer: Self-pay

## 2017-04-25 ENCOUNTER — Other Ambulatory Visit (HOSPITAL_COMMUNITY)
Admission: RE | Admit: 2017-04-25 | Discharge: 2017-04-25 | Disposition: A | Discharge status: Home / Self Care | Payer: Medicaid Other | Source: Ambulatory Visit | Attending: Obstetrics and Gynecology | Admitting: Obstetrics and Gynecology

## 2017-04-25 ENCOUNTER — Encounter: Payer: Self-pay | Admitting: Obstetrics and Gynecology

## 2017-04-25 ENCOUNTER — Ambulatory Visit (INDEPENDENT_AMBULATORY_CARE_PROVIDER_SITE_OTHER): Payer: Medicaid Other | Admitting: Obstetrics and Gynecology

## 2017-04-25 VITALS — BP 154/81 | HR 89 | Ht 66.0 in | Wt 303.0 lb

## 2017-04-25 DIAGNOSIS — N760 Acute vaginitis: Secondary | ICD-10-CM

## 2017-04-25 MED ORDER — CEPHALEXIN 500 MG PO CAPS: 500.0000 mg | ORAL_CAPSULE | Freq: Four times a day (QID) | ORAL | 0 refills | Status: DC

## 2017-04-25 MED ORDER — TERCONAZOLE 0.8 % VA CREA
1.0000 | TOPICAL_CREAM | Freq: Every day | VAGINAL | 3 refills | Status: DC
Start: 2017-04-25 — End: 2017-05-31

## 2017-04-25 MED ORDER — FLUCONAZOLE 100 MG PO TABS: 100.0000 mg | ORAL_TABLET | Freq: Every day | ORAL | 3 refills | Status: DC
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
## 2017-04-26 ENCOUNTER — Ambulatory Visit: Payer: Medicaid Other

## 2017-04-27 LAB — CERVICOVAGINAL ANCILLARY ONLY
Bacterial vaginitis: NEGATIVE
Candida vaginitis: POSITIVE — AB

## 2017-05-01 ENCOUNTER — Telehealth: Payer: Self-pay | Admitting: Pediatrics

## 2017-05-01 ENCOUNTER — Ambulatory Visit (INDEPENDENT_AMBULATORY_CARE_PROVIDER_SITE_OTHER): Payer: Medicaid Other

## 2017-05-01 DIAGNOSIS — Z23 Encounter for immunization: Secondary | ICD-10-CM

## 2017-05-01 NOTE — Telephone Encounter (Signed)
-----   Message from Mora Bellman, MD sent at 04/28/2017 11:34 AM EDT ----- Please inform patient of yeast infection. She may start Terazol treatment, already prescribed  Thanks  Peggy

## 2017-05-01 NOTE — Telephone Encounter (Signed)
Pt advised of results and recommendations. She voiced understanding and agreed with plan.

## 2017-05-10 ENCOUNTER — Other Ambulatory Visit: Payer: Self-pay | Admitting: Neurology

## 2017-05-15 ENCOUNTER — Telehealth: Payer: Self-pay | Admitting: Neurology

## 2017-05-15 MED ORDER — ZOLMITRIPTAN 5 MG PO TABS: ORAL_TABLET | ORAL | 11 refills | Status: DC

## 2017-05-15 NOTE — Telephone Encounter (Signed)
Patient said zolmitriptan (ZOMIG) 5 MG tablet was denied because of having refills.  [facility] said has no refills.

## 2017-05-15 NOTE — Addendum Note (Signed)
Addended by: Noberto Retort C on: 05/15/2017 01:21 PM   Modules accepted: Orders

## 2017-05-15 NOTE — Telephone Encounter (Addendum)
[facility] filled last refill on file.  Medication appropriate.  Refills sent to the pharmacy.

## 2017-05-29 ENCOUNTER — Other Ambulatory Visit: Payer: Self-pay | Admitting: Obstetrics and Gynecology

## 2017-05-29 DIAGNOSIS — Z1231 Encounter for screening mammogram for malignant neoplasm of breast: Secondary | ICD-10-CM

## 2017-05-31 ENCOUNTER — Telehealth: Payer: Self-pay | Admitting: Neurology

## 2017-05-31 ENCOUNTER — Ambulatory Visit: Payer: Medicaid Other | Admitting: Neurology

## 2017-05-31 ENCOUNTER — Encounter: Payer: Self-pay | Admitting: Neurology

## 2017-05-31 ENCOUNTER — Other Ambulatory Visit: Payer: Self-pay | Admitting: *Deleted

## 2017-05-31 VITALS — BP 148/86 | HR 100 | Ht 66.0 in | Wt 297.0 lb

## 2017-05-31 DIAGNOSIS — G43719 Chronic migraine without aura, intractable, without status migrainosus: Secondary | ICD-10-CM | POA: Diagnosis not present

## 2017-05-31 MED ORDER — FREMANEZUMAB-VFRM 225 MG/1.5ML ~~LOC~~ SOSY: 225.0000 mg | PREFILLED_SYRINGE | SUBCUTANEOUS | 11 refills | Status: DC

## 2017-05-31 NOTE — Telephone Encounter (Signed)
Mack with [facility] is calling regarding Rx Fremanezumab-vfrm (AJOVY) 225 MG/1.5ML SOSY that was sent in today. Medicaid says Dr. Rhea Belton Provider #'s are not going through.                                       Flowsheet Report                                                                           Flowsheet Report

## 2017-05-31 NOTE — Progress Notes (Signed)
PATIENT: [name] DOB: Aug 01, 1965  Chief Complaint  Patient presents with  . Migraine    Botox 100 units x 2 vials - specialty pharmacy     HISTORICAL  [name], Is accompanied by her sister returned for Botox injection as migraine prevention  She has past medical history of HIV, hyperlipidemia, migraines, depression, and non-Hodgkin lymphoma in 2005 with chemotherapy and radiation.   She has history of migraines since 19. In the last 10 years, she reports having daily migraines. She is currently taking Topamax and Zomig with relief. She takes approximately 20-25 tablets of her Zomig per month.    She has aura's that look like floating dirt particles, flashing lights, and she sees Christmas tree lights when they are severe, she has nausea, vomiting and sometimes diarrhea when they're severe. She also complains of diplopia, blurry vision, and  photophobia. She also has had a lightning bolt sensation throughout her body.  She describes them as pulsating and throbbing  on a scale of 5-6/10 which she considers mild, when they are severe they can go as high as 10/10. They usually start in the right temporal area, but will also start on the left temporal and are worse.   Any type of smoke or MSG can precipitate a migraine. They generally last 2-9 days. She has been to the emergency department twice in the last year for migraines. She states "I just can't do this anymore I need to do something about her migraines"  She has tried and failed different preventive medications:  atenolol, verapamil, nortriptyline, Inderal, Depakote without benefit. Atenolol and verapamil caused hypotension. Depakote caused severe vomiting. Topamax causing her worsening headaches, will show tried Zonegran, Topamax ER, she denies significant improvement.  For abortive treatment, she has tried Imitrex, Maxalt with suboptimal response, Zomig as needed since to work best,  she also takes transderm scophalamine  patch for nause  I started to give her BOTOX injection since May 2013, every 3 months, she responded very well,   Previous MRI brain was normal in 2013.  Last Botox injection as migraine prevention was March 2017, she responded very well, but in recent few weeks, she noticed increased migraine headaches, this baby had headache left retro-orbital area severe pounding headache with associated light noise sensitivity, has been ongoing for 3 days, failed to respond to Zomig by mouth, Imitrex injection as needed, she has constant dry heaves, difficulty opening her eyes, In addition, today she complains of restless leg symptoms, difficult to stand or lying still for extended period of time, has tried Neurontin-cause nausea, confusion,  Lyrica-cause excessive weight gain, she has never tried dopamine agonist in the past  I reviewed laboratory in April 2017, normal hemoglobin 12 point 9, mild elevated RDW 15.8, decreased MCV   Update April 27 2016  Last Botox injection was in June 2017, she had left knee arthroscopic surgery in August 2017, complaining of significant joints pain, unsteady gait, before Botox she was having moderate to severe headache daily, even Botox now, she used to 12 tablets of Zomig every months because of frequent moderate to severe headache  Update August 10 2016: Last Botox injection was in October 2017, has been very helpful, but over the past 3-4 weeks, she had frequent almost daily headaches, oftentimes wake her up from sleep, Zomig has been very helpful,  Low dose Mirapex 0.25 milligrams 2 tablets every night has been very helpful for her restless leg symptoms, she reported 80% improvement, laboratory  reviewed in January 2018, CD4 count 760, normal hemoglobin 12.3, mildly decreased MCV, increased RDW,  Update Nov 09 2016: She suffered bilateral ear infection, strep throat since last visit, had frequent headaches, nausea,  Update February 22 2017: She responded well to  previous injection, complains of a lot of stress at home  UPDATE May 31 2017: She complains suboptimal response to Botox injection, had daily headache over last week, also complains of a lot of stress at home, average more than 4 headaches in 1 week, lasting all day, with associated nausea vomiting, is taking Zomig as needed, with partial response  REVIEW OF SYSTEMS: Full 14 system review of systems performed and notable only for as above  ALLERGIES: Allergies  Allergen Reactions  . Diazepam     Triggers migraines  . Divalproex Sodium Nausea And Vomiting    Causes light-headedness  . Gabapentin Nausea And Vomiting    confusion  . Ondansetron Hcl Nausea And Vomiting    Triggers migraines, "makes me vomit"  . Penicillins     REACTION: lips swollen to point of bleeding, sever vaginal irritation  Reports tolerating augmentin well without any complaints or reaction  . Pineapple   . Propoxyphene N-Acetaminophen Nausea And Vomiting  . Simvastatin     PATIENT CANNOT BE PRESCRIBED THIS STATIN WHILE RECEIVING NORVIR  . Topamax [Topiramate]     Migraines   . Tramadol Hcl Itching    REACTION: migraines and itching--hx of migraines even without medication   . Zocor [Simvastatin - High Dose]     HOME MEDICATIONS: Current Outpatient Medications  Medication Sig Dispense Refill  . Beclomethasone Dipropionate 80 MCG/ACT AERS Place 2 Inhalers into the nose daily. 8.7 g 10  . BOTOX 100 units SOLR injection MD IS TO INJECT 200 UNITS INTRAMUSCULARLY INTO HEAD AND NECK MUSCLES EVERY 3 MONTHS 2 each 3  . Calcium Carbonate-Vitamin D (CALCIUM-VITAMIN D) 500-200 MG-UNIT per tablet Take 2 tablets by mouth daily.     . clotrimazole (MYCELEX) 10 MG troche Take 10 mg by mouth.    . DESCOVY 200-25 MG tablet TAKE 1 TABLET BY MOUTH EVERY DAY 30 tablet 3  . diclofenac sodium (VOLTAREN) 1 % GEL Apply 4 g topically 4 (four) times daily. 1 Tube 11  . doxycycline (VIBRA-TABS) 100 MG tablet Take 1 tablet (100 mg  total) by mouth 2 (two) times daily. 20 tablet 1  . DULERA 100-5 MCG/ACT AERO USE 2 PUFFS BY MOUTH THE FIRST THING IN THE MORNING AND THEN ANOTHER 2 PUFFS ABOUT 12 HOURS LATER 13 g 5  . fluconazole (DIFLUCAN) 100 MG tablet Take 1 tablet (100 mg total) by mouth daily. 14 tablet 3  . fluocinonide (LIDEX) 0.05 % external solution 1 gtt AU qD prn itching    . fluticasone (FLONASE) 50 MCG/ACT nasal spray Place 2 sprays into both nostrils daily.    Marland Kitchen NEXIUM 40 MG capsule TAKE 1 CAPSULE BY MOUTH 2 TIMES DAILY BEFORE A MEAL 60 capsule 0  . pramipexole (MIRAPEX) 0.25 MG tablet Take 3 tablets (0.75 mg total) by mouth at bedtime. 90 tablet 11  . PREZCOBIX 800-150 MG tablet TAKE 1 TABLET BY MOUTH EVERY DAY 30 tablet 5  . PROAIR HFA 108 (90 Base) MCG/ACT inhaler USE 1 TO 2 PUFFS EVERY 4 TO 6 HOURS AS NEEDED SHORTNESS OF BREATH OR WHEEZING 8.5 g 1  . prochlorperazine (COMPAZINE) 10 MG tablet Take 1 tablet (10 mg total) by mouth every 6 (six) hours as needed for nausea or  vomiting. 30 tablet 11  . Tamsulosin HCl (FLOMAX) 0.4 MG CAPS Take 0.4 mg by mouth daily. 1-2 daily    . tiZANidine (ZANAFLEX) 4 MG tablet Take 1 tablet (4 mg total) by mouth 2 (two) times daily as needed for muscle spasms. 60 tablet 11  . TRANSDERM-SCOP, 1.5 MG, 1 MG/3DAYS PLACE 1 PATCH ON SKIN EVERY 3 DAYS 10 patch 11  . zolmitriptan (ZOMIG) 5 MG tablet Take 1 tab at onset of migraine.  May repeat, with 1 tab, in 2 hrs, if needed.  Max dose of 2 tabs/24hrs.  Must last 30 days. #12/30 12 tablet 11   No current facility-administered medications for this visit.     PAST MEDICAL HISTORY: Past Medical History:  Diagnosis Date  . Acute gastritis without mention of hemorrhage 01/01/2002  . Anemia 10/2009   macrocytic anemia with baseline MCV 104-106  . Anxiety   . Asthma   . Boil 01/05/2017  . 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 Complex tear of medial meniscus of left knee as current injury 02/25/2016  . Complication of anesthesia     hard to wake up  . COPD (chronic obstructive pulmonary disease) (HCC)    chronic bronchitis  . Derangement of anterior horn of lateral meniscus of left knee 02/25/2016  . Duodenitis without mention of hemorrhage 01/01/2002  . DVT (deep venous thrombosis) (HCC)    history clot lt groin when she had lymphoma-in remision now.  . Elevated triglycerides with high cholesterol 01/05/2017  . Gastroparesis   . GERD (gastroesophageal reflux disease)   . Hiatal hernia 01/01/2002  . HIV infection ([place])    CD4 = 570 (05/2010), VL undetectation  . Hyperlipidemia   . IBS (irritable bowel syndrome)   . Migraines    on topamax and triptans, frequent (almost daily) attacks   . Non Hodgkin's lymphoma ([place])    stage II, s/p resection, chemotherapy, radiotherapy, Dr. Beryle Beams is her oncologist.   . PUD (peptic ulcer disease) 07/1997   per EGD report 07/1997 with history of esophagitis  . Restless leg syndrome 12/24/2015  . Shingles    in lumbar dermatome  . Spondylolisthesis of lumbar region    L5-S1  . TMJ (dislocation of temporomandibular joint)   . Traumatic tear of lateral meniscus of right knee 04/20/2012    PAST SURGICAL HISTORY: Past Surgical History:  Procedure Laterality Date  . CHOLECYSTECTOMY    . ENDOMETRIAL ABLATION    . KNEE ARTHROSCOPY Right 04/20/12  . KNEE ARTHROSCOPY WITH LATERAL MENISECTOMY Left 02/25/2016   Procedure: LEFT KNEE ARTHROSCOPY WITH MEDIAL AND  LATERAL MENISECTOMIES;  Surgeon: Marchia Bond, MD;  Location: [facility];  Service: Orthopedics;  Laterality: Left;  . KNEE ARTHROSCOPY WITH MEDIAL MENISECTOMY Left 02/25/2016   Procedure: KNEE ARTHROSCOPY WITH MEDIAL MENISECTOMY;  Surgeon: Marchia Bond, MD;  Location: [facility];  Service: Orthopedics;  Laterality: Left;  . TONSILLECTOMY    . TYMPANOPLASTY Left   . TYMPANOSTOMY TUBE PLACEMENT     as child-both  . UPPER GASTROINTESTINAL ENDOSCOPY  2003, 2007   done by Dr. Velora Heckler    FAMILY  HISTORY: Family History  Problem Relation Age of Onset  . Hypertension Mother   . Stroke Mother   . Heart disease Mother   . Breast cancer Paternal Grandmother   . Kidney cancer Maternal Uncle   . Heart disease Maternal Uncle   . Diabetes Maternal Uncle   . Diabetes Maternal Grandmother   . Heart disease  Maternal Grandmother   . Emphysema Paternal Uncle        never smoker  . Emphysema Paternal Grandfather        never smoker  . Colon cancer Paternal Aunt        with met to lung   . Non-Hodgkin's lymphoma Maternal Uncle   . Heart disease Maternal Grandfather     SOCIAL HISTORY:  Social History   Socioeconomic History  . Marital status: Divorced    Spouse name: Not on file  . Number of children: 0  . Years of education: 72  . Highest education level: Not on file  Social Needs  . Financial resource strain: Not on file  . Food insecurity - worry: Not on file  . Food insecurity - inability: Not on file  . Transportation needs - medical: Not on file  . Transportation needs - non-medical: Not on file  Occupational History  . Occupation: disabled    Fish farm manager: UNEMPLOYED  Tobacco Use  . Smoking status: Never Smoker  . Smokeless tobacco: Never Used  Substance and Sexual Activity  . Alcohol use: No    Alcohol/week: 0.0 oz  . Drug use: No  . Sexual activity: Not Currently    Partners: Male    Birth control/protection: Surgical  Other Topics Concern  . Not on file  Social History Narrative      Patient is disabled.   Right handed.   Caffeine - Diet soda., tea   Education - high school     PHYSICAL EXAM   Vitals:   05/31/17 1434  BP: (!) 148/86  Pulse: 100  Weight: 297 lb (134.7 kg)  Height: '5\' 6"'$  (1.676 m)    Not recorded      Body mass index is 47.94 kg/m.  PHYSICAL EXAMNIATION:  Gen: NAD, conversant, well nourised, obese, well groomed                     Cardiovascular: Regular rate rhythm, no peripheral edema, warm, nontender. Eyes:  Conjunctivae clear without exudates or hemorrhage Neck: Supple, no carotid bruise. Pulmonary: Clear to auscultation bilaterally   NEUROLOGICAL EXAM:  MENTAL STATUS: Speech:    Speech is normal; fluent and spontaneous with normal comprehension.  Cognition:     Orientation to time, place and person     Normal recent and remote memory     Normal Attention span and concentration     Normal Language, naming, repeating,spontaneous speech     Fund of knowledge   CRANIAL NERVES: CN II: Visual fields are full to confrontation. Fundoscopic exam is normal with sharp discs and no vascular changes. Pupils are round equal and briskly reactive to light. CN III, IV, VI: extraocular movement are normal. No ptosis. CN V: Facial sensation is intact to pinprick in all 3 divisions bilaterally. Corneal responses are intact.  CN VII: Face is symmetric with normal eye closure and smile. CN VIII: Hearing is normal to rubbing fingers CN IX, X: Palate elevates symmetrically. Phonation is normal. CN XI: Head turning and shoulder shrug are intact CN XII: Tongue is midline with normal movements and no atrophy.  MOTOR: There is no pronator drift of out-stretched arms. Muscle bulk and tone are normal. Muscle strength is normal.  REFLEXES: Reflexes are 2+ and symmetric at the biceps, triceps, knees, and ankles. Plantar responses are flexor.  SENSORY: Intact to light touch, pinprick, positional sensation and vibratory sensation are intact in fingers and toes.  COORDINATION: Rapid alternating movements  and fine finger movements are intact. There is no dysmetria on finger-to-nose and heel-knee-shin.    GAIT/STANCE: Posture is normal. Gait is steady with normal steps, base, arm swing, and turning. Heel and toe walking are normal. Tandem gait is normal.  Romberg is absent.  DIAGNOSTIC DATA (LABS, IMAGING, TESTING) - I reviewed patient records, labs, notes, testing and imaging myself where  available.   ASSESSMENT AND PLAN  NAZIYA HEGWOOD is a 51 y.o. female    History of HIV, hypercholesterolemia, Non hodgkin' lymphoma with chemotherapy and radiation and migraines since age 81.  Chronic migraine headaches She has tried and failed multiple preventive medications including atenolol, verapamil, nortriptyline, and Depakote,Topamax, Topamax ER, could not tolerate Cardizem,  She has migraines daily for more than  10 years. She is currently taking  zonogram  and Zomig with some relief. Previously tried imitrex and Maxalt with suboptimal response,  Normal MRI brain, and neurological exam.  BOTOX injection was performed according to protocol by [facility]. 100 units of BOTOX was dissolved into 2 cc NS  Used total 200 units  BOTOX injection was performed according to protocol by [facility]. 100 units of BOTOX was dissolved into 2 cc NS.      Extra 45 units was injected into bilateral upper trapezius, upper cervical paraspinal muscles  Patient tolerate the injection well. She complains of suboptimal control of her migraine headaches even with Botox injection every 3 months, last week, she has daily migraine, on average, she has more than migraine headaches they seen a week, lasting more than 4 hours, with associated nausea, vomiting,  Will try Ajovy in Feb 2019, Rx was sent in   Restless leg symptoms   Continue Mirapex 0.2 5 mg,  3 tablets every night  Ferritin 51 in Feb 2018,     Marcial Pacas, M.D. Ph.D.  [facility] [address] [place], [place] [address] Ph: [phone] Fax: [phone]  CC: Referring Provider

## 2017-05-31 NOTE — Telephone Encounter (Signed)
Spoke to the [facility] - the medication has been resent and processed through [place].  No PA was required.

## 2017-05-31 NOTE — Progress Notes (Signed)
**  Botox 100 units x 2 vials, NDC 3748-2707-86, Lot [id number], Exp 12/2019, specialty pharmacy.//mck,rn**

## 2017-06-08 ENCOUNTER — Ambulatory Visit: Payer: Self-pay | Admitting: Obstetrics and Gynecology

## 2017-06-08 ENCOUNTER — Encounter: Payer: Self-pay | Admitting: Internal Medicine

## 2017-06-08 ENCOUNTER — Ambulatory Visit: Payer: Medicaid Other | Admitting: Internal Medicine

## 2017-06-08 VITALS — BP 180/85 | HR 91 | Temp 98.7°F | Wt 305.5 lb

## 2017-06-08 DIAGNOSIS — K5900 Constipation, unspecified: Secondary | ICD-10-CM | POA: Diagnosis not present

## 2017-06-08 DIAGNOSIS — E118 Type 2 diabetes mellitus with unspecified complications: Secondary | ICD-10-CM

## 2017-06-08 DIAGNOSIS — I10 Essential (primary) hypertension: Secondary | ICD-10-CM | POA: Diagnosis not present

## 2017-06-08 DIAGNOSIS — G4700 Insomnia, unspecified: Secondary | ICD-10-CM

## 2017-06-08 DIAGNOSIS — E1169 Type 2 diabetes mellitus with other specified complication: Secondary | ICD-10-CM | POA: Diagnosis present

## 2017-06-08 DIAGNOSIS — F329 Major depressive disorder, single episode, unspecified: Secondary | ICD-10-CM

## 2017-06-08 DIAGNOSIS — G43909 Migraine, unspecified, not intractable, without status migrainosus: Secondary | ICD-10-CM | POA: Diagnosis not present

## 2017-06-08 DIAGNOSIS — G4489 Other headache syndrome: Secondary | ICD-10-CM

## 2017-06-08 DIAGNOSIS — Z833 Family history of diabetes mellitus: Secondary | ICD-10-CM

## 2017-06-08 DIAGNOSIS — C8590 Non-Hodgkin lymphoma, unspecified, unspecified site: Secondary | ICD-10-CM

## 2017-06-08 DIAGNOSIS — B20 Human immunodeficiency virus [HIV] disease: Secondary | ICD-10-CM | POA: Diagnosis not present

## 2017-06-08 DIAGNOSIS — R635 Abnormal weight gain: Secondary | ICD-10-CM

## 2017-06-08 DIAGNOSIS — E1142 Type 2 diabetes mellitus with diabetic polyneuropathy: Secondary | ICD-10-CM | POA: Insufficient documentation

## 2017-06-08 DIAGNOSIS — Z6841 Body Mass Index (BMI) 40.0 and over, adult: Secondary | ICD-10-CM

## 2017-06-08 DIAGNOSIS — E119 Type 2 diabetes mellitus without complications: Secondary | ICD-10-CM | POA: Insufficient documentation

## 2017-06-08 MED ORDER — METFORMIN HCL 500 MG PO TABS: 1000.0000 mg | ORAL_TABLET | Freq: Two times a day (BID) | ORAL | 0 refills | Status: DC

## 2017-06-08 MED ORDER — BLOOD GLUCOSE METER KIT: PACK | 0 refills | Status: AC

## 2017-06-08 MED ORDER — GLUCOSE BLOOD VI STRP: ORAL_STRIP | 12 refills | Status: AC

## 2017-06-08 MED ORDER — HYDROXYZINE HCL 10 MG PO TABS: 25.0000 mg | ORAL_TABLET | Freq: Every day | ORAL | 2 refills | Status: DC

## 2017-06-08 MED ORDER — LISINOPRIL 10 MG PO TABS: 10.0000 mg | ORAL_TABLET | Freq: Every day | ORAL | 0 refills | Status: DC

## 2017-06-08 NOTE — Patient Instructions (Signed)
It was a pleasure to meet you today.   START the Metformin today.  - Take 1 tablet (500 mg) with breakfast for 5 days  -THEN take 1 tablet (500 mg) with breakfast and dinner for 5 days,  -THEN Take 2 tablets (1000 mg ) with breakfast and 1 tablet (500 mg) with dinner for 5 days,  - THEN take 2 tablets (1000 mg) with breakfast and dinner   START the Lisinopril 10 mg every day   START taking Hydroxyzine 25 mg 30 minutes before bed.  Diabetes Mellitus and Food It is important for you to manage your blood sugar (glucose) level. Your blood glucose level can be greatly affected by what you eat. Eating healthier foods in the appropriate amounts throughout the day at about the same time each day will help you control your blood glucose level. It can also help slow or prevent worsening of your diabetes mellitus. Healthy eating may even help you improve the level of your blood pressure and reach or maintain a healthy weight. General recommendations for healthful eating and cooking habits include:  Eating meals and snacks regularly. Avoid going long periods of time without eating to lose weight.  Eating a diet that consists mainly of plant-based foods, such as fruits, vegetables, nuts, legumes, and whole grains.  Using low-heat cooking methods, such as baking, instead of high-heat cooking methods, such as deep frying.  Work with your dietitian to make sure you understand how to use the Nutrition Facts information on food labels. How can food affect me? Carbohydrates Carbohydrates affect your blood glucose level more than any other type of food. Your dietitian will help you determine how many carbohydrates to eat at each meal and teach you how to count carbohydrates. Counting carbohydrates is important to keep your blood glucose at a healthy level, especially if you are using insulin or taking certain medicines for diabetes mellitus. Alcohol Alcohol can cause sudden decreases in blood glucose  (hypoglycemia), especially if you use insulin or take certain medicines for diabetes mellitus. Hypoglycemia can be a life-threatening condition. Symptoms of hypoglycemia (sleepiness, dizziness, and disorientation) are similar to symptoms of having too much alcohol. If your health care provider has given you approval to drink alcohol, do so in moderation and use the following guidelines:  Women should not have more than one drink per day, and men should not have more than two drinks per day. One drink is equal to: ? 12 oz of beer. ? 5 oz of wine. ? 1 oz of hard liquor.  Do not drink on an empty stomach.  Keep yourself hydrated. Have water, diet soda, or unsweetened iced tea.  Regular soda, juice, and other mixers might contain a lot of carbohydrates and should be counted.  What foods are not recommended? As you make food choices, it is important to remember that all foods are not the same. Some foods have fewer nutrients per serving than other foods, even though they might have the same number of calories or carbohydrates. It is difficult to get your body what it needs when you eat foods with fewer nutrients. Examples of foods that you should avoid that are high in calories and carbohydrates but low in nutrients include:  Trans fats (most processed foods list trans fats on the Nutrition Facts label).  Regular soda.  Juice.  Candy.  Sweets, such as cake, pie, doughnuts, and cookies.  Fried foods.  What foods can I eat? Eat nutrient-rich foods, which will nourish your body  and keep you healthy. The food you should eat also will depend on several factors, including:  The calories you need.  The medicines you take.  Your weight.  Your blood glucose level.  Your blood pressure level.  Your cholesterol level.  You should eat a variety of foods, including:  Protein. ? Lean cuts of meat. ? Proteins low in saturated fats, such as fish, egg whites, and beans. Avoid processed  meats.  Fruits and vegetables. ? Fruits and vegetables that may help control blood glucose levels, such as apples, mangoes, and yams.  Dairy products. ? Choose fat-free or low-fat dairy products, such as milk, yogurt, and cheese.  Grains, bread, pasta, and rice. ? Choose whole grain products, such as multigrain bread, whole oats, and brown rice. These foods may help control blood pressure.  Fats. ? Foods containing healthful fats, such as nuts, avocado, olive oil, canola oil, and fish.  Does everyone with diabetes mellitus have the same meal plan? Because every person with diabetes mellitus is different, there is not one meal plan that works for everyone. It is very important that you meet with a dietitian who will help you create a meal plan that is just right for you. This information is not intended to replace advice given to you by your health care provider. Make sure you discuss any questions you have with your health care provider. Document Released: 03/17/2005 Document Revised: 11/26/2015 Document Reviewed: 05/17/2013 Elsevier Interactive Patient Education  2017 [employer].

## 2017-06-08 NOTE — Assessment & Plan Note (Signed)
>>  ASSESSMENT AND PLAN FOR TYPE 2 DIABETES MELLITUS (HCC) WRITTEN ON 06/08/2017  3:32 PM BY HELBERG, JUSTIN, MD  Patient presented with A1c of 9.4 during her urology appointment. She states that this is the third time she has needed to start medications. She was previously on Metformin and Acarbose without issues but was able to come off the medications with significant weight loss and exercise. She was previously ~220 lbs she is now 305 lbs. She is not interested in dietary education at this point. She took a class several years ago and still has books she will review on her own. She expresses a motivation to loss weight and get off medications. She is currently drinking several sugary drinks per day ('cheer wine') but plans to stop. She eats take out for several meals but plans to being cooking at home and watching her carb intake. She is currently not active but plans to start exercising daily. Family history is significant for DM in her mother and several aunts. Associated symptoms include N/V, blurry vision, fatigue, and difficulty concentrating.   She has been checking her fasting blood sugar using a Contour Next EZ meter. Average AM CBG is 250-350.   We discussed that the biggest help to reducing the number of medications needed to control her DM will be weight loss and exercise. She voices understanding and will work on both. In the mean time we will add on metformin and titrate up to 1000 mg BID. She will follow-up in 4 weeks to add on further medication if needed.   Plan: - Encouraged weight loss and exercise  - START metformin 500 mg QD. She will up titrate to 1000 mg BID  - Prescription for a CBG monitor and strips sent out  - She will follow-up in 4 weeks with her monitor. If her CBG is not at goal we will add on an SLGT2 inhibitor. I would avoid adding a GLP-1 agonist or a DPP-4 inhibitor as the patient has a history of gastroparesis.

## 2017-06-08 NOTE — Progress Notes (Signed)
   CC: Follow-up for her DM, HTN, and Insomnia   HPI:  [name] is a 51 y.o. female who presented for continued evaluation and management of her DM, HTN, and Insomnia. For a detailed evaluation, assessment, and management please refer to problem based charting for her DM, HTN, and Insomnia.   Past Medical History:  Diagnosis Date  . Acute gastritis without mention of hemorrhage 01/01/2002  . Anemia 10/2009   macrocytic anemia with baseline MCV 104-106  . Anxiety   . Asthma   . Boil 01/05/2017  . 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 Complex tear of medial meniscus of left knee as current injury 02/25/2016  . Complication of anesthesia    hard to wake up  . COPD (chronic obstructive pulmonary disease) (HCC)    chronic bronchitis  . Derangement of anterior horn of lateral meniscus of left knee 02/25/2016  . Duodenitis without mention of hemorrhage 01/01/2002  . DVT (deep venous thrombosis) (HCC)    history clot lt groin when she had lymphoma-in remision now.  . Elevated triglycerides with high cholesterol 01/05/2017  . Gastroparesis   . GERD (gastroesophageal reflux disease)   . Hiatal hernia 01/01/2002  . HIV infection ([place])    CD4 = 570 (05/2010), VL undetectation  . Hyperlipidemia   . IBS (irritable bowel syndrome)   . Migraines    on topamax and triptans, frequent (almost daily) attacks   . Non Hodgkin's lymphoma ([place])    stage II, s/p resection, chemotherapy, radiotherapy, Dr. Beryle Beams is her oncologist.   . PUD (peptic ulcer disease) 07/1997   per EGD report 07/1997 with history of esophagitis  . Restless leg syndrome 12/24/2015  . Shingles    in lumbar dermatome  . Spondylolisthesis of lumbar region    L5-S1  . TMJ (dislocation of temporomandibular joint)   . Traumatic tear of lateral meniscus of right knee 04/20/2012   Review of Systems:   General: + fatigue, difficulty focusing  HENT: + Blurry vision  CV: - CP, palpitations  Pulm: - SOB, cough  GI: + N/V,  constipation   Physical Exam: Vitals:   06/08/17 1327  BP: (!) 180/85  Pulse: 91  Temp: 98.7 F (37.1 C)  TempSrc: Oral  SpO2: 98%  Weight: (!) 305 lb 8 oz (138.6 kg)   General: Obese female in no acute distress HENT: Normocephalic, atraumatic, moist mucus membranes  Pulm: Good air movement with no wheezing or crackles  CV: RRR, no murmurs, no rubs  Abdomen: Active bowel sounds, soft, non-distended, no tenderness to palpation  Extremities: No LE edema, radial pulses palpable bilaterally  Neuro: Alert and oriented x 3  Assessment & Plan:   See Encounters Tab for problem based charting.  Patient seen with Dr. Beryle Beams

## 2017-06-08 NOTE — Assessment & Plan Note (Addendum)
Patient presented with A1c of 9.4 during her urology appointment. She states that this is the third time she has needed to start medications. She was previously on Metformin and Acarbose without issues but was able to come off the medications with significant weight loss and exercise. She was previously ~220 lbs she is now 305 lbs. She is not interested in dietary education at this point. She took a class several years ago and still has books she will review on her own. She expresses a motivation to loss weight and get off medications. She is currently drinking several sugary drinks per day ('cheer wine') but plans to stop. She eats take out for several meals but plans to being cooking at home and watching her carb intake. She is currently not active but plans to start exercising daily. Family history is significant for DM in her mother and several aunts. Associated symptoms include N/V, blurry vision, fatigue, and difficulty concentrating.   She has been checking her fasting blood sugar using a Contour Next EZ meter. Average AM CBG is 250-350.   We discussed that the biggest help to reducing the number of medications needed to control her DM will be weight loss and exercise. She voices understanding and will work on both. In the mean time we will add on metformin and titrate up to 1000 mg BID. She will follow-up in 4 weeks to add on further medication if needed.   Plan: - Encouraged weight loss and exercise  - START metformin 500 mg QD. She will up titrate to 1000 mg BID  - Prescription for a CBG monitor and strips sent out  - She will follow-up in 4 weeks with her monitor. If her CBG is not at goal we will add on an SLGT2 inhibitor. I would avoid adding a GLP-1 agonist or a DPP-4 inhibitor as the patient has a history of gastroparesis.

## 2017-06-08 NOTE — Assessment & Plan Note (Signed)
Patient reports a significant history of insomnia and states that she will go several days without much sleep. She does struggle with depression but does not want to start any medications. She has tried Trazodone in the past but it made her migraine headaches worse. She also tried linzess and Ambien without much improvement. She does not get relief with benadryl or melatonin. We discussed a trial of hydroxyzine. She agrees.   Plan:  - START hydroxyzine 25 mg QHS  - AVOID Ambien, Linzess, and Trazodone

## 2017-06-08 NOTE — Assessment & Plan Note (Addendum)
Patient BP is 180/85 today. She states that it is elevated due to a migraine headache and significant life stressors. Review of her chart indicates that her SBP is typically >140. We discussed starting lisinopril. She agrees.   Plan: - Encouraged weight loss and exercise  - START Lisinopril 10 mg QD  - Will check a BMP at her follow-up in 4 weeks

## 2017-06-09 NOTE — Progress Notes (Signed)
Medicine attending: Medical history, presenting problems, physical findings, and medications, reviewed with resident physician Dr Ina Homes on the day of the patient visit and I concur with his evaluation and management plan. HIV positive woman well known to me from previous Rx of high grade Non Hodgkin's lymphoma now out over 10 years from diagnosis and likely cured. HIV disease remains well controlled. Ongoing problems with excessive weight gain, type 2 diabetes, HTN, migraine headaches, and depression. Plan as outline by Dr Tarri Abernethy.

## 2017-06-14 ENCOUNTER — Telehealth: Payer: Self-pay

## 2017-06-14 MED ORDER — CEPHALEXIN 500 MG PO CAPS: 500.0000 mg | ORAL_CAPSULE | Freq: Three times a day (TID) | ORAL | 0 refills | Status: DC

## 2017-06-14 NOTE — Telephone Encounter (Signed)
Pt aware request for Keflex was approved and sent to her pharmacy.

## 2017-06-14 NOTE — Telephone Encounter (Addendum)
Received call from pt. She was seen in Oct 2018 for "pubic hair boils" and states the boils are bacl. She said the Keflex Dr. Elly Modena gave her worked so she's requesting another rf. Pt denies pain at this time and is using warm compression. Pt wanted to inform us she has been recently diagnoised with diabetes. She said recent Hgb A1c level is 9 and she's taking Metformin. Informed pt I will consult with provider and c/b.

## 2017-06-15 ENCOUNTER — Other Ambulatory Visit: Payer: Self-pay | Admitting: Internal Medicine

## 2017-06-15 ENCOUNTER — Telehealth: Payer: Self-pay | Admitting: Internal Medicine

## 2017-06-15 DIAGNOSIS — E118 Type 2 diabetes mellitus with unspecified complications: Secondary | ICD-10-CM

## 2017-06-15 MED ORDER — DAPAGLIFLOZIN PROPANEDIOL 5 MG PO TABS: 5.0000 mg | ORAL_TABLET | Freq: Every day | ORAL | 3 refills | Status: DC

## 2017-06-15 NOTE — Progress Notes (Unsigned)
Patient states that the metformin is upsetting her stomach too much and she no longer wants to take it. Discussed starting glipizide vs dapagliflozin. She would like to try dapagliflozin instead of glipizide due to weight gain side effect. Discussed the benefits and risks of dapagliflozin. Also continued to encourage lifestyle changes.

## 2017-06-15 NOTE — Telephone Encounter (Signed)
Pt calls to state she's unable to tolerate the metformin due to GI problems.  Feels like "acid" in her stomach-diarrhea started a few days after starting metformin 500mg  daily-she is unable to titrate up to 1000mg  BID.  More frequent nausea and vomiting-lowest blood glucose reading 264 but is running anywhere between  250-450.  Pt is unable to be seen in the clinic as she is "trapped" by snow/ice.  Is requesting a different medication and call from provider.  Please advise.Despina Hidden Cassady12/13/201811:56 AM

## 2017-06-15 NOTE — Telephone Encounter (Signed)
Thank you. Spoke with patient. New prescription sent out

## 2017-06-15 NOTE — Telephone Encounter (Signed)
Patient would like Ulis Rias to call back

## 2017-06-16 NOTE — Telephone Encounter (Addendum)
Of note, new [name] rx will need prior authorization from [employer] submitted online via [place] [employer]. Decision pending review by medicaid.Sherry Hidden Cassady12/14/20183:28 PM     Farxiga approved through 06/11/2018.Sherry Hidden Cassady12/14/20184:52 PM

## 2017-06-16 NOTE — Telephone Encounter (Addendum)
Return call made to patient-pt pick up rx for hydroxyzine 10 mg take 2.5 tabs (25mg ) at bedtime #30.  Pt requesting qty to reflect 30-day supply. Will send request to pcp for review and qty change.  Please advise.Despina Hidden Cassady12/14/20182:59 PM

## 2017-06-21 ENCOUNTER — Other Ambulatory Visit: Payer: Self-pay | Admitting: Internal Medicine

## 2017-06-21 DIAGNOSIS — G4700 Insomnia, unspecified: Secondary | ICD-10-CM

## 2017-06-21 MED ORDER — HYDROXYZINE HCL 25 MG PO TABS: 25.0000 mg | ORAL_TABLET | Freq: Every day | ORAL | 2 refills | Status: DC

## 2017-06-22 ENCOUNTER — Ambulatory Visit: Payer: Self-pay | Admitting: Obstetrics and Gynecology

## 2017-06-23 ENCOUNTER — Other Ambulatory Visit: Payer: Medicaid Other

## 2017-06-28 ENCOUNTER — Ambulatory Visit: Payer: Medicaid Other

## 2017-07-06 ENCOUNTER — Ambulatory Visit: Payer: Self-pay | Admitting: Obstetrics and Gynecology

## 2017-07-10 ENCOUNTER — Ambulatory Visit: Payer: Medicaid Other | Admitting: Infectious Disease

## 2017-07-11 ENCOUNTER — Other Ambulatory Visit: Payer: Self-pay | Admitting: Sports Medicine

## 2017-07-11 ENCOUNTER — Other Ambulatory Visit: Payer: Self-pay | Admitting: Internal Medicine

## 2017-07-11 ENCOUNTER — Other Ambulatory Visit: Payer: Self-pay | Admitting: Neurology

## 2017-07-11 DIAGNOSIS — I10 Essential (primary) hypertension: Secondary | ICD-10-CM

## 2017-07-18 ENCOUNTER — Telehealth: Payer: Self-pay

## 2017-07-18 DIAGNOSIS — E118 Type 2 diabetes mellitus with unspecified complications: Secondary | ICD-10-CM

## 2017-07-18 DIAGNOSIS — N760 Acute vaginitis: Secondary | ICD-10-CM

## 2017-07-18 MED ORDER — SITAGLIPTIN PHOSPHATE 100 MG PO TABS: 100.0000 mg | ORAL_TABLET | Freq: Every day | ORAL | 2 refills | Status: DC

## 2017-07-18 MED ORDER — DAPAGLIFLOZIN PROPANEDIOL 10 MG PO TABS: 10.0000 mg | ORAL_TABLET | Freq: Every day | ORAL | 3 refills | Status: DC

## 2017-07-18 MED ORDER — CLOTRIMAZOLE 2 % VA CREA: 1.0000 | TOPICAL_CREAM | Freq: Every day | VAGINAL | 0 refills | Status: DC

## 2017-07-18 NOTE — Telephone Encounter (Signed)
Ms rauber is Upset that she cannot get back in with Dr. Tarri Abernethy so wants to see an endocrinologist. Refuses to come in to [facility] or see another doctor here or to come in and see me right now. (wants to feel better first before seeing me)   In the meantime, she agreed to increase in [place] to 10 mg daily and treatment for her yeast infection.  I listened and provided support: She reports very fatigued, crotch hurts with yeast, had crotch pain prior to [place], thinks it is from her high sugar. We discussed that it could be from high sugar and  increasing her sugars now.  She has yeast cream she will use, but agrees to use something else for her yeast infection if Dr. Tarri Abernethy wants to order something.   4 days headaches.has to feel better before she can exercise and feel like eating better.  Not willing to retry metformin.  No regular soda, but uses diet soda when sick on stomach. Doesn't remember using acarbose.   Agreed to call me next week to update [place] on how she is doing(and for support).

## 2017-07-18 NOTE — Telephone Encounter (Signed)
I had an in-depth conversation with the patient regarding her diabetes management. She is frustrated with the lack of continuity at the clinic and would like to be referred to an Endocrinologist to help her control her diabetes. She stressed that it is not related to short comings of the clinic but more that she wants to see one provider for her diabetes.   We also discussed her difficult to control CBGs. She states that even with the [place] she is unable to get her CBG <200. We discuss restarting metformin but she declined. We discussed adding additional pharmacological therapies and she agree. We agreed on adding Sitagliptin to her regimen. We discussed that DDP4 inhibitors can decrease gastric emptying and make gastroparesis worse; however, in therapy this should be less than a GLP-1 agonist. She voices understanding and would like to avoid insulin therapy at all costs.   She is also having issue with vaginal pruritis. She states that this has been going on for several days. She is specifically requesting topical treatment as opposed to oral. We discussed that if this continues to recur or gets worse we may need to stop the [place]. She agrees.   Diabetes Mellitus Plan:  - Increase Farxiga to 10 mg  - Add on Sitagliptin 100 mg QD - Referral to Endocrinology  - Appreciate Donna's help communicating with the patient   Vulvovaginal Candidiasis Plan: - Clotrimazole 2% cream for 10 days

## 2017-07-18 NOTE — Telephone Encounter (Signed)
Per patient diabetes med is not working. Requesting the nurse to call back.

## 2017-07-18 NOTE — Telephone Encounter (Signed)
Called pt - states her blood sugars are staying above 200; lowerest is 210 or 215; last night it was 248. Does not want to be place on insulin. Prefers to see her doctor only. Requesting endocrinologist referral - here in [place]. Suggested talking to [facility], diab coordinator - she agreed. Also will send herrequest to Dr Tarri Abernethy.

## 2017-07-18 NOTE — Addendum Note (Signed)
Addended by: Ina Homes T on: 07/18/2017 05:12 PM   Modules accepted: Orders

## 2017-07-20 ENCOUNTER — Other Ambulatory Visit: Payer: Self-pay

## 2017-07-20 ENCOUNTER — Other Ambulatory Visit: Payer: Self-pay | Admitting: Internal Medicine

## 2017-07-20 ENCOUNTER — Telehealth: Payer: Self-pay | Admitting: Dietician

## 2017-07-20 DIAGNOSIS — N760 Acute vaginitis: Secondary | ICD-10-CM

## 2017-07-20 MED ORDER — CLOTRIMAZOLE 2 % VA CREA: 1.0000 | TOPICAL_CREAM | Freq: Every day | VAGINAL | 0 refills | Status: DC

## 2017-07-20 NOTE — Telephone Encounter (Addendum)
She had questions about 1-  why Celesta Gentile was prescibed which she says I answered to her satisfaction.  2- The endocrinologist we referred her to does not take her insurance. She'd like to try Dr.Patel in [place] where her mother goes if possible. I'll let the referral person know.  3- She did not get vaginal cream: called pharmacy they did not get it, they ask [place] to resend it or call it in

## 2017-07-21 ENCOUNTER — Ambulatory Visit
Admission: RE | Admit: 2017-07-21 | Discharge: 2017-07-21 | Disposition: A | Discharge status: Home / Self Care | Payer: Medicaid Other | Source: Ambulatory Visit | Attending: Obstetrics and Gynecology | Admitting: Obstetrics and Gynecology

## 2017-07-21 DIAGNOSIS — Z1231 Encounter for screening mammogram for malignant neoplasm of breast: Secondary | ICD-10-CM

## 2017-07-24 ENCOUNTER — Ambulatory Visit: Payer: Medicaid Other | Admitting: Obstetrics and Gynecology

## 2017-07-24 ENCOUNTER — Encounter: Payer: Self-pay | Admitting: Obstetrics and Gynecology

## 2017-07-24 ENCOUNTER — Other Ambulatory Visit (HOSPITAL_COMMUNITY)
Admission: RE | Admit: 2017-07-24 | Discharge: 2017-07-24 | Disposition: A | Discharge status: Home / Self Care | Payer: Medicaid Other | Source: Ambulatory Visit | Attending: Obstetrics and Gynecology | Admitting: Obstetrics and Gynecology

## 2017-07-24 VITALS — BP 134/79 | HR 106 | Ht 66.0 in | Wt 290.7 lb

## 2017-07-24 DIAGNOSIS — Z0000 Encounter for general adult medical examination without abnormal findings: Secondary | ICD-10-CM

## 2017-07-24 DIAGNOSIS — Z01419 Encounter for gynecological examination (general) (routine) without abnormal findings: Secondary | ICD-10-CM | POA: Insufficient documentation

## 2017-07-24 DIAGNOSIS — B373 Candidiasis of vulva and vagina: Secondary | ICD-10-CM | POA: Insufficient documentation

## 2017-07-24 DIAGNOSIS — N898 Other specified noninflammatory disorders of vagina: Secondary | ICD-10-CM | POA: Insufficient documentation

## 2017-07-24 MED ORDER — TERCONAZOLE 0.4 % VA CREA: 1.0000 | TOPICAL_CREAM | Freq: Every day | VAGINAL | 1 refills | Status: DC

## 2017-07-24 NOTE — Progress Notes (Signed)
GYNECOLOGY ANNUAL PREVENTATIVE CARE ENCOUNTER NOTE  Subjective:   [name] is a 52 y.o.  female here for a annual gynecologic exam. Current complaints: yeast infection, reports significant discharge itching and burning.  Denies abnormal vaginal bleeding, pelvic pain, or other gynecologic concerns. S/p endometrial ablation 2007. Not sexually active. Declines STI testing.  She started a second diabetic medication this month and is not well controlled yet. HIV is very well controlled.    Gynecologic History No LMP recorded. Patient has had an ablation. Last Pap: 06/2016. Results were: normal Last mammogram: 07/2017. Results were: Bir[place] 1  Obstetric History OB History  Gravida Para Term Preterm AB Living  2       2    SAB TAB Ectopic Multiple Live Births  2            # Outcome Date GA Lbr Len/2nd Weight Sex Delivery Anes PTL Lv  2 SAB           1 SAB               Past Medical History:  Diagnosis Date  . Acute gastritis without mention of hemorrhage 01/01/2002  . Anemia 10/2009   macrocytic anemia with baseline MCV 104-106  . Anxiety   . Asthma   . Boil 01/05/2017  . 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 Complex tear of medial meniscus of left knee as current injury 02/25/2016  . Complication of anesthesia    hard to wake up  . COPD (chronic obstructive pulmonary disease) (HCC)    chronic bronchitis  . Derangement of anterior horn of lateral meniscus of left knee 02/25/2016  . Duodenitis without mention of hemorrhage 01/01/2002  . DVT (deep venous thrombosis) (HCC)    history clot lt groin when she had lymphoma-in remision now.  . Elevated triglycerides with high cholesterol 01/05/2017  . Gastroparesis   . GERD (gastroesophageal reflux disease)   . Hiatal hernia 01/01/2002  . HIV infection ([place])    CD4 = 570 (05/2010), VL undetectation  . Hyperlipidemia   . IBS (irritable bowel syndrome)   . Migraines    on topamax and triptans, frequent (almost daily) attacks   .  Non Hodgkin's lymphoma ([place])    stage II, s/p resection, chemotherapy, radiotherapy, Dr. Beryle Beams is her oncologist.   . PUD (peptic ulcer disease) 07/1997   per EGD report 07/1997 with history of esophagitis  . Restless leg syndrome 12/24/2015  . Shingles    in lumbar dermatome  . Spondylolisthesis of lumbar region    L5-S1  . TMJ (dislocation of temporomandibular joint)   . Traumatic tear of lateral meniscus of right knee 04/20/2012    Past Surgical History:  Procedure Laterality Date  . CHOLECYSTECTOMY    . ENDOMETRIAL ABLATION    . KNEE ARTHROSCOPY Right 04/20/12  . KNEE ARTHROSCOPY WITH LATERAL MENISECTOMY Left 02/25/2016   Procedure: LEFT KNEE ARTHROSCOPY WITH MEDIAL AND  LATERAL MENISECTOMIES;  Surgeon: Marchia Bond, MD;  Location: [facility];  Service: Orthopedics;  Laterality: Left;  . KNEE ARTHROSCOPY WITH MEDIAL MENISECTOMY Left 02/25/2016   Procedure: KNEE ARTHROSCOPY WITH MEDIAL MENISECTOMY;  Surgeon: Marchia Bond, MD;  Location: [facility];  Service: Orthopedics;  Laterality: Left;  . TONSILLECTOMY    . TYMPANOPLASTY Left   . TYMPANOSTOMY TUBE PLACEMENT     as child-both  . UPPER GASTROINTESTINAL ENDOSCOPY  2003, 2007   done by Dr. Velora Heckler    Current Outpatient  Medications on File Prior to Visit  Medication Sig Dispense Refill  . Beclomethasone Dipropionate 80 MCG/ACT AERS Place 2 Inhalers into the nose daily. 8.7 g 10  . blood glucose meter kit and supplies Dispense based on patient and insurance preference. Use up to four times daily as directed. (FOR ICD-9 250.00, 250.01). 1 each 0  . BOTOX 100 units SOLR injection MD IS TO INJECT 200 UNITS INTRAMUSCULARLY INTO HEAD AND NECK MUSCLES EVERY 3 MONTHS 2 each 3  . Calcium Carbonate-Vitamin D (CALCIUM-VITAMIN D) 500-200 MG-UNIT per tablet Take 2 tablets by mouth daily.     . dapagliflozin propanediol (FARXIGA) 10 MG TABS tablet Take 10 mg by mouth daily. 30 tablet 3  . DESCOVY 200-25 MG  tablet TAKE 1 TABLET BY MOUTH EVERY DAY 30 tablet 3  . diclofenac sodium (VOLTAREN) 1 % GEL Apply 4 g topically 4 (four) times daily. 1 Tube 11  . DULERA 100-5 MCG/ACT AERO USE 2 PUFFS BY MOUTH THE FIRST THING IN THE MORNING AND THEN ANOTHER 2 PUFFS ABOUT 12 HOURS LATER 13 g 5  . fluconazole (DIFLUCAN) 100 MG tablet Take 1 tablet (100 mg total) by mouth daily. 14 tablet 3  . fluocinonide (LIDEX) 0.05 % external solution 1 gtt AU qD prn itching    . fluticasone (FLONASE) 50 MCG/ACT nasal spray Place 2 sprays into both nostrils daily.    . Fremanezumab-vfrm (AJOVY) 225 MG/1.5ML SOSY Inject 225 mg into the skin every 30 (thirty) days. 1 Syringe 11  . glucose blood test strip Use as instructed 100 each 12  . hydrOXYzine (ATARAX/VISTARIL) 25 MG tablet Take 1 tablet (25 mg total) by mouth at bedtime. 30 tablet 2  . lisinopril (PRINIVIL,ZESTRIL) 10 MG tablet TAKE 1 TABLET BY MOUTH EVERY DAY 90 tablet 0  . NEXIUM 40 MG capsule TAKE 1 CAPSULE BY MOUTH 2 TIMES DAILY BEFORE A MEAL 60 capsule 0  . pramipexole (MIRAPEX) 0.25 MG tablet Take 3 tablets (0.75 mg total) by mouth at bedtime. 90 tablet 11  . PREZCOBIX 800-150 MG tablet TAKE 1 TABLET BY MOUTH EVERY DAY 30 tablet 5  . PROAIR HFA 108 (90 Base) MCG/ACT inhaler USE 1 TO 2 PUFFS EVERY 4 TO 6 HOURS AS NEEDED SHORTNESS OF BREATH OR WHEEZING 8.5 g 1  . prochlorperazine (COMPAZINE) 10 MG tablet Take 1 tablet (10 mg total) by mouth every 6 (six) hours as needed for nausea or vomiting. 30 tablet 11  . sitaGLIPtin (JANUVIA) 100 MG tablet Take 1 tablet (100 mg total) by mouth daily. 30 tablet 2  . Tamsulosin HCl (FLOMAX) 0.4 MG CAPS Take 0.4 mg by mouth daily. 1-2 daily    . tiZANidine (ZANAFLEX) 4 MG tablet TAKE 1 TABLET BY MOUTH 2 TIMES A DAY AS NEEDED FOR MUSCLE SPASMS 60 tablet 11  . TRANSDERM-SCOP, 1.5 MG, 1 MG/3DAYS PLACE 1 PATCH ON SKIN EVERY 3 DAYS 10 patch 11  . zolmitriptan (ZOMIG) 5 MG tablet Take 1 tab at onset of migraine.  May repeat, with 1 tab, in  2 hrs, if needed.  Max dose of 2 tabs/24hrs.  Must last 30 days. #12/30 12 tablet 11  . cephALEXin (KEFLEX) 500 MG capsule Take 1 capsule (500 mg total) by mouth 3 (three) times daily. (Patient not taking: Reported on 07/24/2017) 21 capsule 0   No current facility-administered medications on file prior to visit.     Allergies  Allergen Reactions  . Diazepam     Triggers migraines  . Divalproex Sodium  Nausea And Vomiting    Causes light-headedness  . Gabapentin Nausea And Vomiting    confusion  . Ondansetron Hcl Nausea And Vomiting    Triggers migraines, "makes me vomit"  . Penicillins     REACTION: lips swollen to point of bleeding, sever vaginal irritation  Reports tolerating augmentin well without any complaints or reaction  . Pineapple   . Propoxyphene N-Acetaminophen Nausea And Vomiting  . Simvastatin     PATIENT CANNOT BE PRESCRIBED THIS STATIN WHILE RECEIVING NORVIR  . Topamax [Topiramate]     Migraines   . Tramadol Hcl Itching    REACTION: migraines and itching--hx of migraines even without medication   . Zocor [Simvastatin - High Dose]     Social History   Socioeconomic History  . Marital status: Divorced    Spouse name: Not on file  . Number of children: 0  . Years of education: 90  . Highest education level: Not on file  Social Needs  . Financial resource strain: Not on file  . Food insecurity - worry: Not on file  . Food insecurity - inability: Not on file  . Transportation needs - medical: Not on file  . Transportation needs - non-medical: Not on file  Occupational History  . Occupation: disabled    Fish farm manager: UNEMPLOYED  Tobacco Use  . Smoking status: Never Smoker  . Smokeless tobacco: Never Used  Substance and Sexual Activity  . Alcohol use: No    Alcohol/week: 0.0 oz  . Drug use: No  . Sexual activity: Not Currently    Partners: Male    Birth control/protection: Surgical  Other Topics Concern  . Not on file  Social History Narrative       Patient is disabled.   Right handed.   Caffeine - Diet soda., tea   Education - high school    Family History  Problem Relation Age of Onset  . Hypertension Mother   . Stroke Mother   . Heart disease Mother   . Diabetes Mother   . Breast cancer Paternal Grandmother   . Kidney cancer Maternal Uncle   . Heart disease Maternal Uncle   . Diabetes Maternal Uncle   . Diabetes Maternal Grandmother   . Heart disease Maternal Grandmother   . Emphysema Paternal Uncle        never smoker  . Emphysema Paternal Grandfather        never smoker  . Colon cancer Paternal Aunt        with met to lung   . Non-Hodgkin's lymphoma Maternal Uncle   . Heart disease Maternal Grandfather     The following portions of the patient's history were reviewed and updated as appropriate: allergies, current medications, past family history, past medical history, past social history, past surgical history and problem list.  Review of Systems Pertinent items are noted in HPI.   Objective:  BP 134/79   Pulse (!) 106   Ht 5' 6" (1.676 m)   Wt 290 lb 11.2 oz (131.9 kg)   BMI 46.92 kg/m  CONSTITUTIONAL: Well-developed, well-nourished female in no acute distress.  HENT:  Normocephalic, atraumatic, External right and left ear normal. Oropharynx is clear and moist EYES: Conjunctivae and EOM are normal. Pupils are equal, round, and reactive to light. No scleral icterus.  NECK: Normal range of motion, supple, no masses.  Normal thyroid.  SKIN: Skin is warm and dry. No rash noted. Not diaphoretic. No erythema. No pallor. NEUROLOGIC: Alert and oriented to person,  place, and time. Normal reflexes, muscle tone coordination. No cranial nerve deficit noted. PSYCHIATRIC: Normal mood and affect. Normal behavior. Normal judgment and thought content. CARDIOVASCULAR: Normal heart rate noted, regular rhythm RESPIRATORY: Clear to auscultation bilaterally. Effort and breath sounds normal, no problems with respiration  noted. BREASTS: Symmetric in size. No masses, skin changes, nipple drainage, or lymphadenopathy. ABDOMEN: Soft, normal bowel sounds, no distention noted.  No tenderness, rebound or guarding.  PELVIC: Normal appearing external genitalia; normal appearing cervix.  Scant amount white discharge, irritated vaginal mucosa otherwise normal appearing  Pap smear obtained.  Normal uterine size, no other palpable masses, no uterine or adnexal tenderness. MUSCULOSKELETAL: Normal range of motion. No tenderness.  No cyanosis, clubbing, or edema.  2+ distal pulses.   Assessment and Plan:  1. Well woman exam - not due for pap however after discussion, patient would like to have one today - Cytology - PAP - declines STI testing  2. Vaginal discharge Thinks she has yeast infection, has had multiple since being diagnosed with diabetes, requesting terazol script - Cervicovaginal ancillary only   Will follow up results of pap smear and manage accordingly. Encouraged improvement in diet and exercise.  Mammogram up to date Flu vaccine up to date  Routine preventative health maintenance measures emphasized. Please refer to After Visit Summary for other counseling recommendations.    Feliz Beam, M.D. Attending [place], [facility] for [employer], [place]

## 2017-07-25 LAB — CERVICOVAGINAL ANCILLARY ONLY
Bacterial vaginitis: NEGATIVE
CANDIDA VAGINITIS: POSITIVE — AB

## 2017-07-26 ENCOUNTER — Telehealth: Payer: Self-pay | Admitting: *Deleted

## 2017-07-26 LAB — CYTOLOGY - PAP
DIAGNOSIS: NEGATIVE
HPV (WINDOPATH): NOT DETECTED

## 2017-07-26 MED ORDER — FLUCONAZOLE 100 MG PO TABS: 200.0000 mg | ORAL_TABLET | Freq: Every day | ORAL | 0 refills | Status: DC

## 2017-07-26 NOTE — Telephone Encounter (Signed)
-----   Message from Truman Hayward, MD sent at 07/26/2017  4:04 PM EST ----- I would give her 10 days but also tell her that she is developing fluconazole R yeast and that we need to be careful about when we prescribe it going forward.  ----- Message ----- From: [name], CMA Sent: 07/26/2017   1:54 PM To: Truman Hayward, MD  How long a course for the Fluconazole? Darnelle Maffucci ----- Message ----- From: Tommy Medal, Lavell Islam, MD Sent: 07/26/2017   9:51 AM To: [place] Callas, NP, [facility] Triage Nurse Pool  Since glabrata has frequent Dose dependent R to fluconazole. I woulld recommend giving her higher dose fluconazole for glabrata yeast infection at maybe 200mg  daily. In future she should NO longer treated for "thrush" she keeps claiming that she has that I have NOT visualized on exam and with very high cd4 count ----- Message ----- From: [place] Callas, NP Sent: 07/25/2017   3:35 PM To: Truman Hayward, MD  I have not seen this growing 'there' yet so this is interesting.   We did not do her pap smear here and it seems she was in her OB/GYN's office yesterday requesting a script for vaginal drainage after looking at the note from that visit.   ----- Message ----- From: Tommy Medal, Lavell Islam, MD Sent: 07/25/2017   3:03 PM To: [place] Callas, NP, [facility] Triage Nurse Pool  +She is now growing an organism that tends to show up in ppl who are over prescribed azoles. C glabrata can be fairly R to fluconazole. I am NOT going to try to treat this unless I hear about symptoms INDEPENDENTLY of this pap smear

## 2017-08-07 ENCOUNTER — Other Ambulatory Visit: Payer: Self-pay | Admitting: Sports Medicine

## 2017-08-08 ENCOUNTER — Other Ambulatory Visit: Payer: Medicaid Other

## 2017-08-08 ENCOUNTER — Other Ambulatory Visit: Payer: Self-pay | Admitting: Infectious Disease

## 2017-08-08 DIAGNOSIS — B20 Human immunodeficiency virus [HIV] disease: Secondary | ICD-10-CM

## 2017-08-08 NOTE — Addendum Note (Signed)
Addended byMarcellina Millin on: 08/08/2017 08:44 AM   Modules accepted: Orders

## 2017-08-08 NOTE — Addendum Note (Signed)
Addended byMeriel Pica F on: 08/08/2017 08:45 AM   Modules accepted: Orders

## 2017-08-09 LAB — T-HELPER CELL (CD4) - (RCID CLINIC ONLY)
CD4 % Helper T Cell: 25 % — ABNORMAL LOW (ref 33–55)
CD4 T Cell Abs: 620 /uL (ref 400–2700)

## 2017-08-09 LAB — COMPLETE METABOLIC PANEL WITH GFR
AG Ratio: 1.3 (calc) (ref 1.0–2.5)
ALT: 12 U/L (ref 6–29)
AST: 14 U/L (ref 10–35)
Albumin: 4.7 g/dL (ref 3.6–5.1)
Alkaline phosphatase (APISO): 73 U/L (ref 33–130)
BUN: 16 mg/dL (ref 7–25)
CALCIUM: 9.8 mg/dL (ref 8.6–10.4)
CO2: 24 mmol/L (ref 20–32)
CREATININE: 0.96 mg/dL (ref 0.50–1.05)
Chloride: 103 mmol/L (ref 98–110)
GFR, EST AFRICAN AMERICAN: 80 mL/min/{1.73_m2} (ref >=60)
GFR, EST NON AFRICAN AMERICAN: 69 mL/min/{1.73_m2} (ref >=60)
GLUCOSE: 168 mg/dL — AB (ref 65–99)
Globulin: 3.5 g/dL (calc) (ref 1.9–3.7)
Potassium: 4.3 mmol/L (ref 3.5–5.3)
Sodium: 137 mmol/L (ref 135–146)
TOTAL PROTEIN: 8.2 g/dL — AB (ref 6.1–8.1)
Total Bilirubin: 0.4 mg/dL (ref 0.2–1.2)

## 2017-08-09 LAB — CBC WITH DIFFERENTIAL/PLATELET
BASOS PCT: 0.3 %
Basophils Absolute: 26 cells/uL (ref 0–200)
EOS PCT: 0.3 %
Eosinophils Absolute: 26 cells/uL (ref 15–500)
HCT: 46 % — ABNORMAL HIGH (ref 35.0–45.0)
Hemoglobin: 15.2 g/dL (ref 11.7–15.5)
Lymphs Abs: 2112 cells/uL (ref 850–3900)
MCH: 25.3 pg — ABNORMAL LOW (ref 27.0–33.0)
MCHC: 33 g/dL (ref 32.0–36.0)
MCV: 76.7 fL — ABNORMAL LOW (ref 80.0–100.0)
MONOS PCT: 4.5 %
MPV: 9.1 fL (ref 7.5–12.5)
Neutro Abs: 6239 cells/uL (ref 1500–7800)
Neutrophils Relative %: 70.9 %
Platelets: 287 10*3/uL (ref 140–400)
RBC: 6 10*6/uL — AB (ref 3.80–5.10)
RDW: 14.5 % (ref 11.0–15.0)
TOTAL LYMPHOCYTE: 24 %
WBC: 8.8 10*3/uL (ref 3.8–10.8)
WBCMIX: 396 {cells}/uL (ref 200–950)

## 2017-08-09 LAB — RPR: RPR: NONREACTIVE

## 2017-08-10 LAB — HIV-1 RNA QUANT-NO REFLEX-BLD
HIV 1 RNA QUANT: NOT DETECTED {copies}/mL
HIV-1 RNA QUANT, LOG: NOT DETECTED {Log_copies}/mL

## 2017-08-22 ENCOUNTER — Ambulatory Visit: Payer: Medicaid Other | Admitting: Infectious Disease

## 2017-08-24 ENCOUNTER — Encounter: Payer: Self-pay | Admitting: Internal Medicine

## 2017-08-24 ENCOUNTER — Other Ambulatory Visit: Payer: Self-pay

## 2017-08-24 ENCOUNTER — Ambulatory Visit: Payer: Medicaid Other | Admitting: Internal Medicine

## 2017-08-24 VITALS — BP 138/72 | HR 107 | Temp 98.4°F | Ht 66.0 in | Wt 285.9 lb

## 2017-08-24 DIAGNOSIS — B9789 Other viral agents as the cause of diseases classified elsewhere: Secondary | ICD-10-CM

## 2017-08-24 DIAGNOSIS — J310 Chronic rhinitis: Secondary | ICD-10-CM

## 2017-08-24 DIAGNOSIS — H72823 Total perforations of tympanic membrane, bilateral: Secondary | ICD-10-CM | POA: Diagnosis not present

## 2017-08-24 DIAGNOSIS — E119 Type 2 diabetes mellitus without complications: Secondary | ICD-10-CM | POA: Diagnosis not present

## 2017-08-24 DIAGNOSIS — Z7984 Long term (current) use of oral hypoglycemic drugs: Secondary | ICD-10-CM | POA: Diagnosis not present

## 2017-08-24 DIAGNOSIS — Z79899 Other long term (current) drug therapy: Secondary | ICD-10-CM

## 2017-08-24 DIAGNOSIS — Z6841 Body Mass Index (BMI) 40.0 and over, adult: Secondary | ICD-10-CM | POA: Diagnosis not present

## 2017-08-24 DIAGNOSIS — J069 Acute upper respiratory infection, unspecified: Secondary | ICD-10-CM | POA: Diagnosis not present

## 2017-08-24 DIAGNOSIS — E669 Obesity, unspecified: Secondary | ICD-10-CM

## 2017-08-24 DIAGNOSIS — E118 Type 2 diabetes mellitus with unspecified complications: Secondary | ICD-10-CM

## 2017-08-24 DIAGNOSIS — J45909 Unspecified asthma, uncomplicated: Secondary | ICD-10-CM

## 2017-08-24 DIAGNOSIS — F419 Anxiety disorder, unspecified: Secondary | ICD-10-CM | POA: Diagnosis not present

## 2017-08-24 DIAGNOSIS — G4700 Insomnia, unspecified: Secondary | ICD-10-CM | POA: Diagnosis not present

## 2017-08-24 DIAGNOSIS — Z21 Asymptomatic human immunodeficiency virus [HIV] infection status: Secondary | ICD-10-CM | POA: Diagnosis not present

## 2017-08-24 LAB — POCT GLYCOSYLATED HEMOGLOBIN (HGB A1C): HEMOGLOBIN A1C: 7.7

## 2017-08-24 LAB — GLUCOSE, CAPILLARY: GLUCOSE-CAPILLARY: 191 mg/dL — AB (ref 65–99)

## 2017-08-24 MED ORDER — ZOLPIDEM TARTRATE ER 12.5 MG PO TBCR: 12.5000 mg | EXTENDED_RELEASE_TABLET | Freq: Every evening | ORAL | 0 refills | Status: DC | PRN

## 2017-08-24 MED ORDER — PROMETHAZINE-CODEINE 6.25-10 MG/5ML PO SYRP: 5.0000 mL | ORAL_SOLUTION | Freq: Four times a day (QID) | ORAL | 0 refills | Status: DC | PRN

## 2017-08-24 MED ORDER — FLUTICASONE PROPIONATE 50 MCG/ACT NA SUSP: 2.0000 | Freq: Every day | NASAL | 0 refills | Status: DC

## 2017-08-24 MED ORDER — FEXOFENADINE HCL 180 MG PO TABS: 180.0000 mg | ORAL_TABLET | Freq: Every day | ORAL | 2 refills | Status: DC

## 2017-08-24 NOTE — Assessment & Plan Note (Signed)
>>  ASSESSMENT AND PLAN FOR TYPE 2 DIABETES MELLITUS (HCC) WRITTEN ON 08/25/2017  7:57 AM BY HELBERG, JUSTIN, MD  Patient presented for continued evaluation and management of her diabetes mellitus. She had her A1c checked at an outside office in November and it was found to be 9.4. At that point she reclined metformin due to adverse effects and was addiment about avoiding insulin therapy. She was started on dapagliflozin 10 mg QD. Shortly after that visit she called because her sugars continued to be elevated and she was started on Januvia 100 mg QD. She denies any adverse reactions to her current medication regimen.   Today she is doing well and has lost 20lbs since I last saw her. She is making changes to her diet and working towards further weight loss. She continues to check her sugars daily and state they range from 115-170 most days. She does have a visit scheduled with Endocrinology in April and would like them to assume care of her diabetes mellitus. She denies symptoms of hypoglycemia.   Today her A1c is down to 7.7. We discussed that she is doing a great job with weight loss and her diet. Unfortunately we cannot go up on the [place] as both of these medications are at the max recommend dose. She would prefer not to start any sulfonylureas due to the potential for weight gain, was unable to tolerate metformin, and I would prefer to not switch to a GLP-1 agonist as the patient has a history of gastroparesis (although not a validated contraindication and she has tolerated a DPP4 inhibitor without much affect on her gastroparesis).   Plan: - Continue Farxiga and Januvia  - Establish with Endo in April  - Congratulated and encouraged patient to continue to exercise and work towards weight loss.   Weight down 20lbs  CBG never less that 115   Doing well with the current medications  Following up with endocrine in April

## 2017-08-24 NOTE — Progress Notes (Signed)
   CC: Follow-up of her DM  HPI:  [name] is a 52 y.o. with DM, Anxiety, Asthma, and HIV who presented to the clinic for continued evaluation and management of her chronic medical issues. For a detailed evaluation and management plan please refer to problem based charting below.   Past Medical History:  Diagnosis Date  . Acute gastritis without mention of hemorrhage 01/01/2002  . Anemia 10/2009   macrocytic anemia with baseline MCV 104-106  . Anxiety   . Asthma   . Boil 01/05/2017  . 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 Complex tear of medial meniscus of left knee as current injury 02/25/2016  . Complication of anesthesia    hard to wake up  . COPD (chronic obstructive pulmonary disease) (HCC)    chronic bronchitis  . Derangement of anterior horn of lateral meniscus of left knee 02/25/2016  . Duodenitis without mention of hemorrhage 01/01/2002  . DVT (deep venous thrombosis) (HCC)    history clot lt groin when she had lymphoma-in remision now.  . Elevated triglycerides with high cholesterol 01/05/2017  . Gastroparesis   . GERD (gastroesophageal reflux disease)   . Hiatal hernia 01/01/2002  . HIV infection ([place])    CD4 = 570 (05/2010), VL undetectation  . Hyperlipidemia   . IBS (irritable bowel syndrome)   . Migraines    on topamax and triptans, frequent (almost daily) attacks   . 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 Beryle Beams is her oncologist.   . PUD (peptic ulcer disease) 07/1997   per EGD report 07/1997 with history of esophagitis  . Restless leg syndrome 12/24/2015  . Shingles    in lumbar dermatome  . Spondylolisthesis of lumbar region    L5-S1  . TMJ (dislocation of temporomandibular joint)   . Traumatic tear of lateral meniscus of right knee 04/20/2012   Review of Systems:   12 point ROS preformed. All negative aside from those mentioned in the HPI.   Physical Exam: Vitals:   08/24/17 1546  BP: 138/72    Pulse: (!) 107  Temp: 98.4 F (36.9 C)  TempSrc: Oral  SpO2: 98%  Weight: 285 lb 14.4 oz (129.7 kg)  Height: 5\' 6"  (1.676 m)   General: Obese female in no acute distress HENT: Normocephalic, atraumatic, moist mucus membranes, nares are boggy and white. 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  Patient discussed with Dr. Daryll Drown

## 2017-08-24 NOTE — Patient Instructions (Signed)
It was nice to see you today. Keep up the great work with the weight loss. You are doing an excellent job. I have sent out all your prescriptions to your pharmacy. Please let me know your cough and fevers do not improve in the next 3-4 days. Continue all your other medications as prescribed. Have a wonderful day.

## 2017-08-24 NOTE — Assessment & Plan Note (Addendum)
Patient presented for continued evaluation and management of her diabetes mellitus. She had her A1c checked at an outside office in November and it was found to be 9.4. At that point she reclined metformin due to adverse effects and was addiment about avoiding insulin therapy. She was started on dapagliflozin 10 mg QD. Shortly after that visit she called because her sugars continued to be elevated and she was started on Januvia 100 mg QD. She denies any adverse reactions to her current medication regimen.   Today she is doing well and has lost 20lbs since I last saw her. She is making changes to her diet and working towards further weight loss. She continues to check her sugars daily and state they range from 115-170 most days. She does have a visit scheduled with Endocrinology in April and would like them to assume care of her diabetes mellitus. She denies symptoms of hypoglycemia.   Today her A1c is down to 7.7. We discussed that she is doing a great job with weight loss and her diet. Unfortunately we cannot go up on the [place] as both of these medications are at the max recommend dose. She would prefer not to start any sulfonylureas due to the potential for weight gain, was unable to tolerate metformin, and I would prefer to not switch to a GLP-1 agonist as the patient has a history of gastroparesis (although not a validated contraindication and she has tolerated a DPP4 inhibitor without much affect on her gastroparesis).   Plan: - Continue Farxiga and [place] with Endo in April  - Congratulated and encouraged patient to continue to exercise and work towards weight loss.   Weight down 20lbs  CBG never less that 115   Doing well with the current medications  Following up with endocrine in April

## 2017-08-25 ENCOUNTER — Telehealth: Payer: Self-pay | Admitting: Dietician

## 2017-08-25 ENCOUNTER — Telehealth: Payer: Self-pay | Admitting: *Deleted

## 2017-08-25 ENCOUNTER — Encounter: Payer: Self-pay | Admitting: Internal Medicine

## 2017-08-25 DIAGNOSIS — B9789 Other viral agents as the cause of diseases classified elsewhere: Secondary | ICD-10-CM

## 2017-08-25 DIAGNOSIS — G4700 Insomnia, unspecified: Secondary | ICD-10-CM

## 2017-08-25 DIAGNOSIS — J069 Acute upper respiratory infection, unspecified: Secondary | ICD-10-CM | POA: Insufficient documentation

## 2017-08-25 NOTE — Assessment & Plan Note (Signed)
Patient presented with cough and sneezing of 3 weeks duration. She has a history of chronic cough, asthma, and allergic rhinitis. She states that she has recurrent ear and sinus infections and would like to be started on either Cipro or Augmentin. She has had 1 fever yesterday but otherwise been afebrile. She denies sick contact and otherwise feels well denying sinus headache, rhinorrhea, chest congestion, myalgias, arthralgias, N/V, abdominal pain. She does state that she has been unable to sleep due to the cough.   On PE her lung exam is clear to ausculation without wheezing or rhonchi. Her nares are white a boggy. 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   At this point given that she has only had 1 elevated temperature and PE is unremarkable I do not see any acute indication for antibiotic therapy. We will send out cough medicine to help the patient sleep but otherwise we will treat symptomatically. She was instructed to call back in 3-4 days if thing are not improving and we can have her come in for re-evaluation.

## 2017-08-25 NOTE — Assessment & Plan Note (Signed)
Patient continues to struggle with insomnia, both initiation and maintenance. She has tried multiple medications in the past without relief. We recently did a trial of hydroxyzine 25 mg QHS which helped for 1-2 weeks but now does not appear to be helping. We discussed increasing the patient's hydroxyzine to 50 mg but she declined. Although she had adverse reactions to Ambien in the past she would like to start it again. We discuss that this is not a long term medication and we need to focus on sleep hygiene. I would also consider adding Doxepin in the future as this could help with her allergic rhinitis.   Plan: - Discussed sleep hygiene - Given 1 month supply of Ambien

## 2017-08-25 NOTE — Telephone Encounter (Addendum)
Information was faxed to [place] for PA for Zolpidem 12.5 mg tablets.  Call to [facility] PA# [phone]. I- [id number].  Sander Nephew, RN 08/25/2017 4:01 PM.    Call to [facility]-patients insurance requires that patient try Flurazepam , Temazepam or Ambien 10 mg or 5 mg tablets.  There is a 15 tablet per 30 day limit on the Ambien as well.   Message to be sent to Dr. Tarri Abernethy to consider a change in the medication or dosage.  Sander Nephew, RN 08/31/2017 4:41 PM  Call to [facility]-patient has picked up the Zolpidem 10 mg talbets # 15.  Sander Nephew, RN 09/06/2017 8:53 AM.

## 2017-08-25 NOTE — Assessment & Plan Note (Signed)
On PE the patient has boggy white nares consistent with allergic rhinitis. She take allegra daily but has not been using her nasal spray. She states that ID has told her in the past it was okay to use it with her HIV medication. Refill prescription was sent out for Allegra and Fluticasone.

## 2017-08-25 NOTE — Telephone Encounter (Addendum)
[name] calls asking about if [name] is going to be  Increased ( that is what she thought was being looked in to after her visit yesterday)  or can she add another diabetes medicine to the [place]? She asked for glipizide, but suggest other medicine less likely to cause weight gain and hypoglycemia (prandin as needed for CBG >115) would help her without retarding her weight loss efforts. Her blood  She reports her blood sugar is  147-188- wants it to be 115.  She asks for [name]- needs a prior auth today if possible. Spoke with nurse who is working on the [place].

## 2017-08-28 NOTE — Progress Notes (Signed)
Internal Medicine Clinic Attending  Case discussed with Dr. Helberg at the time of the visit.  We reviewed the resident's history and exam and pertinent patient test results.  I agree with the assessment, diagnosis, and plan of care documented in the resident's note.    

## 2017-08-31 NOTE — Telephone Encounter (Signed)
Pt would like to know what is the status of the PA on   zolpidem (AMBIEN CR) 12.5 MG CR tablet       Please call pt back.

## 2017-09-01 MED ORDER — ZOLPIDEM TARTRATE 10 MG PO TABS
10.0000 mg | ORAL_TABLET | Freq: Every evening | ORAL | 0 refills | Status: DC | PRN
Start: 2017-09-01 — End: 2017-09-13

## 2017-09-06 ENCOUNTER — Ambulatory Visit: Payer: Medicaid Other | Admitting: Neurology

## 2017-09-06 ENCOUNTER — Encounter: Payer: Self-pay | Admitting: Neurology

## 2017-09-06 ENCOUNTER — Telehealth: Payer: Self-pay | Admitting: Neurology

## 2017-09-06 VITALS — BP 160/70 | HR 88 | Ht 66.0 in | Wt 286.0 lb

## 2017-09-06 DIAGNOSIS — G43719 Chronic migraine without aura, intractable, without status migrainosus: Secondary | ICD-10-CM

## 2017-09-06 NOTE — Progress Notes (Signed)
PATIENT: MARYSUE FAIT DOB: 1965-10-06  Chief Complaint  Patient presents with  . Botulinum Toxin Injection    botox 100 units x2, specialty pharmacy     HISTORICAL  [name], Is accompanied by her sister returned for Botox injection as migraine prevention  She has past medical history of HIV, hyperlipidemia, migraines, depression, and non-Hodgkin lymphoma in 2005 with chemotherapy and radiation.   She has history of migraines since 19. In the last 10 years, she reports having daily migraines. She is currently taking Topamax and Zomig with relief. She takes approximately 20-25 tablets of her Zomig per month.    She has aura's that look like floating dirt particles, flashing lights, and she sees Christmas tree lights when they are severe, she has nausea, vomiting and sometimes diarrhea when they're severe. She also complains of diplopia, blurry vision, and  photophobia. She also has had a lightning bolt sensation throughout her body.  She describes them as pulsating and throbbing  on a scale of 5-6/10 which she considers mild, when they are severe they can go as high as 10/10. They usually start in the right temporal area, but will also start on the left temporal and are worse.   Any type of smoke or MSG can precipitate a migraine. They generally last 2-9 days. She has been to the emergency department twice in the last year for migraines. She states "I just can't do this anymore I need to do something about her migraines"  She has tried and failed different preventive medications:  atenolol, verapamil, nortriptyline, Inderal, Depakote without benefit. Atenolol and verapamil caused hypotension. Depakote caused severe vomiting. Topamax causing her worsening headaches, will show tried Zonegran, Topamax ER, she denies significant improvement.  For abortive treatment, she has tried Imitrex, Maxalt with suboptimal response, Zomig as needed since to work best,  she also takes transderm  scophalamine patch for nause  I started to give her BOTOX injection since May 2013, every 3 months, she responded very well,   Previous MRI brain was normal in 2013.  Last Botox injection as migraine prevention was March 2017, she responded very well, but in recent few weeks, she noticed increased migraine headaches, this baby had headache left retro-orbital area severe pounding headache with associated light noise sensitivity, has been ongoing for 3 days, failed to respond to Zomig by mouth, Imitrex injection as needed, she has constant dry heaves, difficulty opening her eyes, In addition, today she complains of restless leg symptoms, difficult to stand or lying still for extended period of time, has tried Neurontin-cause nausea, confusion,  Lyrica-cause excessive weight gain, she has never tried dopamine agonist in the past  I reviewed laboratory in April 2017, normal hemoglobin 12 point 9, mild elevated RDW 15.8, decreased MCV   Update April 27 2016  Last Botox injection was in June 2017, she had left knee arthroscopic surgery in August 2017, complaining of significant joints pain, unsteady gait, before Botox she was having moderate to severe headache daily, even Botox now, she used to 12 tablets of Zomig every months because of frequent moderate to severe headache  Update August 10 2016: Last Botox injection was in October 2017, has been very helpful, but over the past 3-4 weeks, she had frequent almost daily headaches, oftentimes wake her up from sleep, Zomig has been very helpful,  Low dose Mirapex 0.25 milligrams 2 tablets every night has been very helpful for her restless leg symptoms, she reported 80% improvement, laboratory reviewed  in January 2018, CD4 count 760, normal hemoglobin 12.3, mildly decreased MCV, increased RDW,  Update Nov 09 2016: She suffered bilateral ear infection, strep throat since last visit, had frequent headaches, nausea,  Update February 22 2017: She  responded well to previous injection, complains of a lot of stress at home  UPDATE May 31 2017: She complains suboptimal response to Botox injection, had daily headache over last week, also complains of a lot of stress at home, average more than 4 headaches in 1 week, lasting all day, with associated nausea vomiting, is taking Zomig as needed, with partial response  UPDATE September 08 2017: She complains of excessive stress, frequent migraine headaches,   REVIEW OF SYSTEMS: Full 14 system review of systems performed and notable only for as above  ALLERGIES: Allergies  Allergen Reactions  . Diazepam     Triggers migraines  . Divalproex Sodium Nausea And Vomiting    Causes light-headedness  . Gabapentin Nausea And Vomiting    confusion  . Ondansetron Hcl Nausea And Vomiting    Triggers migraines, "makes me vomit"  . Penicillins     REACTION: lips swollen to point of bleeding, sever vaginal irritation  Reports tolerating augmentin well without any complaints or reaction  . Pineapple   . Propoxyphene N-Acetaminophen Nausea And Vomiting  . Simvastatin     PATIENT CANNOT BE PRESCRIBED THIS STATIN WHILE RECEIVING NORVIR  . Topamax [Topiramate]     Migraines   . Tramadol Hcl Itching    REACTION: migraines and itching--hx of migraines even without medication   . Zocor [Simvastatin - High Dose]     HOME MEDICATIONS: Current Outpatient Medications  Medication Sig Dispense Refill  . Beclomethasone Dipropionate 80 MCG/ACT AERS Place 2 Inhalers into the nose daily. 8.7 g 10  . blood glucose meter kit and supplies Dispense based on patient and insurance preference. Use up to four times daily as directed. (FOR ICD-9 250.00, 250.01). 1 each 0  . BOTOX 100 units SOLR injection MD IS TO INJECT 200 UNITS INTRAMUSCULARLY INTO HEAD AND NECK MUSCLES EVERY 3 MONTHS 2 each 3  . Calcium Carbonate-Vitamin D (CALCIUM-VITAMIN D) 500-200 MG-UNIT per tablet Take 2 tablets by mouth daily.     . cephALEXin  (KEFLEX) 500 MG capsule Take 1 capsule (500 mg total) by mouth 3 (three) times daily. 21 capsule 0  . dapagliflozin propanediol (FARXIGA) 10 MG TABS tablet Take 10 mg by mouth daily. 30 tablet 3  . DESCOVY 200-25 MG tablet TAKE 1 TABLET BY MOUTH EVERY DAY 30 tablet 3  . DULERA 100-5 MCG/ACT AERO USE 2 PUFFS BY MOUTH THE FIRST THING IN THE MORNING AND THEN ANOTHER 2 PUFFS ABOUT 12 HOURS LATER 13 g 5  . fexofenadine (ALLEGRA) 180 MG tablet Take 1 tablet (180 mg total) by mouth daily. 30 tablet 2  . fluconazole (DIFLUCAN) 100 MG tablet Take 2 tablets (200 mg total) by mouth daily. 20 tablet 0  . fluocinonide (LIDEX) 0.05 % external solution 1 gtt AU qD prn itching    . fluticasone (FLONASE) 50 MCG/ACT nasal spray Place 2 sprays into both nostrils daily. 16 g 0  . Fremanezumab-vfrm (AJOVY) 225 MG/1.5ML SOSY Inject 225 mg into the skin every 30 (thirty) days. 1 Syringe 11  . glucose blood test strip Use as instructed 100 each 12  . hydrOXYzine (ATARAX/VISTARIL) 25 MG tablet Take 1 tablet (25 mg total) by mouth at bedtime. 30 tablet 2  . lisinopril (PRINIVIL,ZESTRIL) 10 MG tablet TAKE 1  TABLET BY MOUTH EVERY DAY 90 tablet 0  . NEXIUM 40 MG capsule TAKE 1 CAPSULE BY MOUTH 2 TIMES DAILY BEFORE A MEAL 60 capsule 0  . pramipexole (MIRAPEX) 0.25 MG tablet Take 3 tablets (0.75 mg total) by mouth at bedtime. 90 tablet 11  . PREZCOBIX 800-150 MG tablet TAKE 1 TABLET BY MOUTH EVERY DAY 30 tablet 5  . PROAIR HFA 108 (90 Base) MCG/ACT inhaler USE 1 TO 2 PUFFS EVERY 4 TO 6 HOURS AS NEEDED SHORTNESS OF BREATH OR WHEEZING 8.5 g 1  . prochlorperazine (COMPAZINE) 10 MG tablet Take 1 tablet (10 mg total) by mouth every 6 (six) hours as needed for nausea or vomiting. 30 tablet 11  . sitaGLIPtin (JANUVIA) 100 MG tablet Take 1 tablet (100 mg total) by mouth daily. 30 tablet 2  . Tamsulosin HCl (FLOMAX) 0.4 MG CAPS Take 0.4 mg by mouth daily. 1-2 daily    . tiZANidine (ZANAFLEX) 4 MG tablet TAKE 1 TABLET BY MOUTH 2 TIMES A  DAY AS NEEDED FOR MUSCLE SPASMS 60 tablet 11  . TRANSDERM-SCOP, 1.5 MG, 1 MG/3DAYS PLACE 1 PATCH ON SKIN EVERY 3 DAYS 10 patch 11  . VOLTAREN 1 % GEL APPLY 4 GRAMS TOPICALLY 4 TIMES DAILY 100 g 1  . zolmitriptan (ZOMIG) 5 MG tablet Take 1 tab at onset of migraine.  May repeat, with 1 tab, in 2 hrs, if needed.  Max dose of 2 tabs/24hrs.  Must last 30 days. #12/30 12 tablet 11  . zolpidem (AMBIEN) 10 MG tablet Take 1 tablet (10 mg total) by mouth at bedtime as needed for sleep. 15 tablet 0   No current facility-administered medications for this visit.     PAST MEDICAL HISTORY: Past Medical History:  Diagnosis Date  . Acute gastritis without mention of hemorrhage 01/01/2002  . Anemia 10/2009   macrocytic anemia with baseline MCV 104-106  . Anxiety   . Asthma   . Boil 01/05/2017  . 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 Complex tear of medial meniscus of left knee as current injury 02/25/2016  . Complication of anesthesia    hard to wake up  . COPD (chronic obstructive pulmonary disease) (HCC)    chronic bronchitis  . Derangement of anterior horn of lateral meniscus of left knee 02/25/2016  . Duodenitis without mention of hemorrhage 01/01/2002  . DVT (deep venous thrombosis) (HCC)    history clot lt groin when she had lymphoma-in remision now.  . Elevated triglycerides with high cholesterol 01/05/2017  . Gastroparesis   . GERD (gastroesophageal reflux disease)   . Hiatal hernia 01/01/2002  . HIV infection ([place])    CD4 = 570 (05/2010), VL undetectation  . Hyperlipidemia   . IBS (irritable bowel syndrome)   . Migraines    on topamax and triptans, frequent (almost daily) attacks   . Non Hodgkin's lymphoma ([place])    stage II, s/p resection, chemotherapy, radiotherapy, Dr. Beryle Beams is her oncologist.   . PUD (peptic ulcer disease) 07/1997   per EGD report 07/1997 with history of esophagitis  . Restless leg syndrome 12/24/2015  . Shingles    in lumbar dermatome  . Spondylolisthesis of  lumbar region    L5-S1  . TMJ (dislocation of temporomandibular joint)   . Traumatic tear of lateral meniscus of right knee 04/20/2012    PAST SURGICAL HISTORY: Past Surgical History:  Procedure Laterality Date  . CHOLECYSTECTOMY    . ENDOMETRIAL ABLATION    . KNEE ARTHROSCOPY Right 04/20/12  .  KNEE ARTHROSCOPY WITH LATERAL MENISECTOMY Left 02/25/2016   Procedure: LEFT KNEE ARTHROSCOPY WITH MEDIAL AND  LATERAL MENISECTOMIES;  Surgeon: Marchia Bond, MD;  Location: [facility];  Service: Orthopedics;  Laterality: Left;  . KNEE ARTHROSCOPY WITH MEDIAL MENISECTOMY Left 02/25/2016   Procedure: KNEE ARTHROSCOPY WITH MEDIAL MENISECTOMY;  Surgeon: Marchia Bond, MD;  Location: [facility];  Service: Orthopedics;  Laterality: Left;  . TONSILLECTOMY    . TYMPANOPLASTY Left   . TYMPANOSTOMY TUBE PLACEMENT     as child-both  . UPPER GASTROINTESTINAL ENDOSCOPY  2003, 2007   done by Dr. Velora Heckler    FAMILY HISTORY: Family History  Problem Relation Age of Onset  . Hypertension Mother   . Stroke Mother   . Heart disease Mother   . Diabetes Mother   . Breast cancer Paternal Grandmother   . Kidney cancer Maternal Uncle   . Heart disease Maternal Uncle   . Diabetes Maternal Uncle   . Diabetes Maternal Grandmother   . Heart disease Maternal Grandmother   . Emphysema Paternal Uncle        never smoker  . Emphysema Paternal Grandfather        never smoker  . Colon cancer Paternal Aunt        with met to lung   . Non-Hodgkin's lymphoma Maternal Uncle   . Heart disease Maternal Grandfather     SOCIAL HISTORY:  Social History   Socioeconomic History  . Marital status: Divorced    Spouse name: Not on file  . Number of children: 0  . Years of education: 74  . Highest education level: Not on file  Social Needs  . Financial resource strain: Not on file  . Food insecurity - worry: Not on file  . Food insecurity - inability: Not on file  . Transportation needs -  medical: Not on file  . Transportation needs - non-medical: Not on file  Occupational History  . Occupation: disabled    Fish farm manager: UNEMPLOYED  Tobacco Use  . Smoking status: Never Smoker  . Smokeless tobacco: Never Used  Substance and Sexual Activity  . Alcohol use: No    Alcohol/week: 0.0 oz  . Drug use: No  . Sexual activity: Not Currently    Partners: Male    Birth control/protection: Surgical  Other Topics Concern  . Not on file  Social History Narrative      Patient is disabled.   Right handed.   Caffeine - Diet soda., tea   Education - high school     PHYSICAL EXAM   Vitals:   09/06/17 1410  BP: (!) 160/70  Pulse: 88  Weight: 286 lb (129.7 kg)  Height: _0  (1.676 m)    Not recorded      Body mass index is 46.16 kg/m.  PHYSICAL EXAMNIATION:  Gen: NAD, conversant, well nourised, obese, well groomed                     Cardiovascular: Regular rate rhythm, no peripheral edema, warm, nontender. Eyes: Conjunctivae clear without exudates or hemorrhage Neck: Supple, no carotid bruise. Pulmonary: Clear to auscultation bilaterally   NEUROLOGICAL EXAM:  MENTAL STATUS: Speech:    Speech is normal; fluent and spontaneous with normal comprehension.  Cognition:     Orientation to time, place and person     Normal recent and remote memory     Normal Attention span and concentration     Normal Language, naming, repeating,spontaneous speech  Fund of knowledge   CRANIAL NERVES: CN II: Visual fields are full to confrontation. Fundoscopic exam is normal with sharp discs and no vascular changes. Pupils are round equal and briskly reactive to light. CN III, IV, VI: extraocular movement are normal. No ptosis. CN V: Facial sensation is intact to pinprick in all 3 divisions bilaterally. Corneal responses are intact.  CN VII: Face is symmetric with normal eye closure and smile. CN VIII: Hearing is normal to rubbing fingers CN IX, X: Palate elevates symmetrically.  Phonation is normal. CN XI: Head turning and shoulder shrug are intact CN XII: Tongue is midline with normal movements and no atrophy.  MOTOR: There is no pronator drift of out-stretched arms. Muscle bulk and tone are normal. Muscle strength is normal.  REFLEXES: Reflexes are 2+ and symmetric at the biceps, triceps, knees, and ankles. Plantar responses are flexor.  SENSORY: Intact to light touch, pinprick, positional sensation and vibratory sensation are intact in fingers and toes.  COORDINATION: Rapid alternating movements and fine finger movements are intact. There is no dysmetria on finger-to-nose and heel-knee-shin.    GAIT/STANCE: Posture is normal. Gait is steady with normal steps, base, arm swing, and turning. Heel and toe walking are normal. Tandem gait is normal.  Romberg is absent.  DIAGNOSTIC DATA (LABS, IMAGING, TESTING) - I reviewed patient records, labs, notes, testing and imaging myself where available.   ASSESSMENT AND PLAN  YAMILETTE GARRETSON is a 52 y.o. female    History of HIV, hypercholesterolemia, Non hodgkin' lymphoma with chemotherapy and radiation and migraines since age 51.  Chronic migraine headaches She has tried and failed multiple preventive medications including atenolol, verapamil, nortriptyline, and Depakote,Topamax, Topamax ER, could not tolerate Cardizem,  She has migraines daily for more than  10 years. She is currently taking  zonogram  and Zomig with some relief. Previously tried imitrex and Maxalt with suboptimal response,  Normal MRI brain, and neurological exam.  BOTOX injection was performed according to protocol by [facility]. 100 units of BOTOX was dissolved into 2 cc NS  Used total 200 units  BOTOX injection was performed according to protocol by [facility]. 100 units of BOTOX was dissolved into 2 cc NS.      Extra 45 units was injected into bilateral upper trapezius, upper cervical paraspinal muscles  Patient tolerate the injection  well. She complains of suboptimal control of her migraine headaches even with Botox injection every 3 months, last week, she has daily migraine, on average, she has more than 3 migraine headaches each week, lasting more than 4 hours, with associated nausea, vomiting,  She tried Ajovy in Feb 2019 without significant improvement, I have suggested her continue AJOVY monthly injection  Also performed IV infusion today   Restless leg symptoms   Continue Mirapex 0.2 5 mg,  3 tablets every night  Ferritin 51 in Feb 2018,     Marcial Pacas, M.D. Ph.D.  [facility] [address] [place], [place] [address] Ph: [phone] Fax: [phone]  CC: Referring Provider

## 2017-09-06 NOTE — Telephone Encounter (Signed)
Pt. Needs 12 wk Botox inj  °

## 2017-09-06 NOTE — Progress Notes (Signed)
Botox 100 units x 2 vials, Lot: [id number], Exp: 02/2020, NDC: 0871-9941-29, specialty pharmacy  Dx: K47.533

## 2017-09-08 ENCOUNTER — Encounter: Payer: Self-pay | Admitting: Neurology

## 2017-09-13 ENCOUNTER — Other Ambulatory Visit: Payer: Self-pay | Admitting: Internal Medicine

## 2017-09-13 DIAGNOSIS — G4700 Insomnia, unspecified: Secondary | ICD-10-CM

## 2017-09-13 DIAGNOSIS — E118 Type 2 diabetes mellitus with unspecified complications: Secondary | ICD-10-CM

## 2017-09-13 NOTE — Telephone Encounter (Signed)
Spoke to Patient and she is scheduled for Botox apt with Dr. Krista Blue 12/20/2017.

## 2017-09-14 ENCOUNTER — Ambulatory Visit: Payer: Medicaid Other | Admitting: Internal Medicine

## 2017-09-21 ENCOUNTER — Other Ambulatory Visit: Payer: Self-pay | Admitting: Internal Medicine

## 2017-09-21 ENCOUNTER — Other Ambulatory Visit: Payer: Self-pay | Admitting: Sports Medicine

## 2017-09-21 DIAGNOSIS — E118 Type 2 diabetes mellitus with unspecified complications: Secondary | ICD-10-CM

## 2017-09-21 DIAGNOSIS — J310 Chronic rhinitis: Secondary | ICD-10-CM

## 2017-09-25 ENCOUNTER — Encounter: Payer: Self-pay | Admitting: Infectious Disease

## 2017-09-25 ENCOUNTER — Ambulatory Visit (INDEPENDENT_AMBULATORY_CARE_PROVIDER_SITE_OTHER): Payer: Medicaid Other | Admitting: Infectious Disease

## 2017-09-25 VITALS — BP 114/76 | HR 91 | Temp 98.3°F | Ht 66.0 in | Wt 273.0 lb

## 2017-09-25 DIAGNOSIS — L0292 Furuncle, unspecified: Secondary | ICD-10-CM | POA: Diagnosis not present

## 2017-09-25 DIAGNOSIS — M222X2 Patellofemoral disorders, left knee: Secondary | ICD-10-CM | POA: Diagnosis not present

## 2017-09-25 DIAGNOSIS — E118 Type 2 diabetes mellitus with unspecified complications: Secondary | ICD-10-CM

## 2017-09-25 DIAGNOSIS — G43719 Chronic migraine without aura, intractable, without status migrainosus: Secondary | ICD-10-CM

## 2017-09-25 DIAGNOSIS — Z6841 Body Mass Index (BMI) 40.0 and over, adult: Secondary | ICD-10-CM | POA: Diagnosis not present

## 2017-09-25 DIAGNOSIS — B373 Candidiasis of vulva and vagina: Secondary | ICD-10-CM | POA: Diagnosis not present

## 2017-09-25 DIAGNOSIS — B20 Human immunodeficiency virus [HIV] disease: Secondary | ICD-10-CM

## 2017-09-25 DIAGNOSIS — M222X1 Patellofemoral disorders, right knee: Secondary | ICD-10-CM | POA: Diagnosis not present

## 2017-09-25 DIAGNOSIS — B3731 Acute candidiasis of vulva and vagina: Secondary | ICD-10-CM

## 2017-09-25 HISTORY — DX: Candidiasis of vulva and vagina: B37.3

## 2017-09-25 HISTORY — DX: Acute candidiasis of vulva and vagina: B37.31

## 2017-09-25 MED ORDER — DARUN-COBIC-EMTRICIT-TENOFAF 800-150-200-10 MG PO TABS: 1.0000 | ORAL_TABLET | Freq: Every day | ORAL | 11 refills | Status: DC

## 2017-09-25 MED ORDER — CEPHALEXIN 500 MG PO CAPS: 500.0000 mg | ORAL_CAPSULE | Freq: Four times a day (QID) | ORAL | 1 refills | Status: AC

## 2017-09-25 NOTE — Progress Notes (Signed)
Chief complaints: recurrent boil in genital area Subjective:    Patient ID: [name], female    DOB: Mar 06, 1966, 52 y.o.   MRN: [id number]  HPI  [name] is a 52 y.o. female who is doing superbly well on her  antiviral regimen, Prezcobix and Descovy with undetectable viral load and health cd4 count.    Lab Results  Component Value Date   HIV1RNAQUANT <20 NOT DETECTED 08/08/2017   HIV1RNAQUANT <20 DETECTED (A) 12/21/2016   HIV1RNAQUANT <20 NOT DETECTED 07/18/2016    Lab Results  Component Value Date   CD4TABS 620 08/08/2017   CD4TABS 1,020 12/21/2016   CD4TABS 760 07/18/2016   She has been having difficulty with her diabetes that she was able to bring her A1c down from 9-7 with 26 pounds of weight loss.  She has now put weight back on again and is concerned about diabetes worsening.  She is adamant that she will be able to lose the weight and not need to go on insulin which she very much does not want to take.  She states that she is suffering from a boil in her genital area and requests prescription for Keflex.  She also has been suffering from recurrent yeast infections in her vaginal area.  In the past she has been asked again for fluconazole for thrush when she did not have it visible and on exam of oropharynx.  She has had Candida glabrata identified on Pap smear recently.     Past Medical History:  Diagnosis Date  . Acute gastritis without mention of hemorrhage 01/01/2002  . Anemia 10/2009   macrocytic anemia with baseline MCV 104-106  . Anxiety   . Asthma   . Boil 01/05/2017  . 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 Complex tear of medial meniscus of left knee as current injury 02/25/2016  . Complication of anesthesia    hard to wake up  . COPD (chronic obstructive pulmonary disease) (HCC)    chronic bronchitis  . Derangement of anterior horn of lateral meniscus of left knee 02/25/2016  . Duodenitis without mention of hemorrhage 01/01/2002  . DVT (deep venous  thrombosis) (HCC)    history clot lt groin when she had lymphoma-in remision now.  . Elevated triglycerides with high cholesterol 01/05/2017  . Gastroparesis   . GERD (gastroesophageal reflux disease)   . Hiatal hernia 01/01/2002  . HIV infection ([place])    CD4 = 570 (05/2010), VL undetectation  . Hyperlipidemia   . IBS (irritable bowel syndrome)   . Migraines    on topamax and triptans, frequent (almost daily) attacks   . Non Hodgkin's lymphoma ([place])    stage II, s/p resection, chemotherapy, radiotherapy, Dr. Beryle Beams is her oncologist.   . PUD (peptic ulcer disease) 07/1997   per EGD report 07/1997 with history of esophagitis  . Restless leg syndrome 12/24/2015  . Shingles    in lumbar dermatome  . Spondylolisthesis of lumbar region    L5-S1  . TMJ (dislocation of temporomandibular joint)   . Traumatic tear of lateral meniscus of right knee 04/20/2012   Past Surgical History:  Procedure Laterality Date  . CHOLECYSTECTOMY    . ENDOMETRIAL ABLATION    . KNEE ARTHROSCOPY Right 04/20/12  . KNEE ARTHROSCOPY WITH LATERAL MENISECTOMY Left 02/25/2016   Procedure: LEFT KNEE ARTHROSCOPY WITH MEDIAL AND  LATERAL MENISECTOMIES;  Surgeon: Marchia Bond, MD;  Location: [facility];  Service: Orthopedics;  Laterality: Left;  . KNEE ARTHROSCOPY WITH  MEDIAL MENISECTOMY Left 02/25/2016   Procedure: KNEE ARTHROSCOPY WITH MEDIAL MENISECTOMY;  Surgeon: Marchia Bond, MD;  Location: [facility];  Service: Orthopedics;  Laterality: Left;  . TONSILLECTOMY    . TYMPANOPLASTY Left   . TYMPANOSTOMY TUBE PLACEMENT     as child-both  . UPPER GASTROINTESTINAL ENDOSCOPY  2003, 2007   done by Dr. Velora Heckler   Family History  Problem Relation Age of Onset  . Hypertension Mother   . Stroke Mother   . Heart disease Mother   . Diabetes Mother   . Breast cancer Paternal Grandmother   . Kidney cancer Maternal Uncle   . Heart disease Maternal Uncle   . Diabetes Maternal Uncle   .  Diabetes Maternal Grandmother   . Heart disease Maternal Grandmother   . Emphysema Paternal Uncle        never smoker  . Emphysema Paternal Grandfather        never smoker  . Colon cancer Paternal Aunt        with met to lung   . Non-Hodgkin's lymphoma Maternal Uncle   . Heart disease Maternal Grandfather    Social History   Tobacco Use  . Smoking status: Never Smoker  . Smokeless tobacco: Never Used  Substance Use Topics  . Alcohol use: No    Alcohol/week: 0.0 oz  . Drug use: No   Allergies  Allergen Reactions  . Diazepam     Triggers migraines  . Divalproex Sodium Nausea And Vomiting    Causes light-headedness  . Gabapentin Nausea And Vomiting    confusion  . Ondansetron Hcl Nausea And Vomiting    Triggers migraines, "makes me vomit"  . Penicillins     REACTION: lips swollen to point of bleeding, sever vaginal irritation  Reports tolerating augmentin well without any complaints or reaction  . Pineapple   . Propoxyphene N-Acetaminophen Nausea And Vomiting  . Simvastatin     PATIENT CANNOT BE PRESCRIBED THIS STATIN WHILE RECEIVING NORVIR  . Topamax [Topiramate]     Migraines   . Tramadol Hcl Itching    REACTION: migraines and itching--hx of migraines even without medication   . Zocor [Simvastatin - High Dose]      Current Outpatient Medications:  .  Beclomethasone Dipropionate 80 MCG/ACT AERS, Place 2 Inhalers into the nose daily., Disp: 8.7 g, Rfl: 10 .  blood glucose meter kit and supplies, Dispense based on patient and insurance preference. Use up to four times daily as directed. (FOR ICD-9 250.00, 250.01)., Disp: 1 each, Rfl: 0 .  BOTOX 100 units SOLR injection, MD IS TO INJECT 200 UNITS INTRAMUSCULARLY INTO HEAD AND NECK MUSCLES EVERY 3 MONTHS, Disp: 2 each, Rfl: 3 .  Calcium Carbonate-Vitamin D (CALCIUM-VITAMIN D) 500-200 MG-UNIT per tablet, Take 2 tablets by mouth daily. , Disp: , Rfl:  .  DESCOVY 200-25 MG tablet, TAKE 1 TABLET BY MOUTH EVERY DAY, Disp: 30  tablet, Rfl: 3 .  DULERA 100-5 MCG/ACT AERO, USE 2 PUFFS BY MOUTH THE FIRST THING IN THE MORNING AND THEN ANOTHER 2 PUFFS ABOUT 12 HOURS LATER, Disp: 13 g, Rfl: 5 .  FARXIGA 10 MG TABS tablet, TAKE 1 TABLET BY MOUTH EVERY DAY, Disp: 30 tablet, Rfl: 2 .  fexofenadine (ALLEGRA) 180 MG tablet, Take 1 tablet (180 mg total) by mouth daily., Disp: 30 tablet, Rfl: 2 .  fluconazole (DIFLUCAN) 100 MG tablet, Take 2 tablets (200 mg total) by mouth daily., Disp: 20 tablet, Rfl: 0 .  fluocinonide (  LIDEX) 0.05 % external solution, 1 gtt AU qD prn itching, Disp: , Rfl:  .  fluticasone (FLONASE) 50 MCG/ACT nasal spray, USE 2 SPRAYS IN BOTH NOSTRILS EVERY DAY, Disp: 16 g, Rfl: 0 .  Fremanezumab-vfrm (AJOVY) 225 MG/1.5ML SOSY, Inject 225 mg into the skin every 30 (thirty) days., Disp: 1 Syringe, Rfl: 11 .  glucose blood test strip, Use as instructed, Disp: 100 each, Rfl: 12 .  hydrOXYzine (ATARAX/VISTARIL) 25 MG tablet, Take 1 tablet (25 mg total) by mouth at bedtime., Disp: 30 tablet, Rfl: 2 .  JANUVIA 100 MG tablet, TAKE 1 TABLET BY MOUTH EVERY DAY, Disp: 30 tablet, Rfl: 2 .  lisinopril (PRINIVIL,ZESTRIL) 10 MG tablet, TAKE 1 TABLET BY MOUTH EVERY DAY, Disp: 90 tablet, Rfl: 0 .  NEXIUM 40 MG capsule, TAKE 1 CAPSULE BY MOUTH 2 TIMES DAILY BEFORE A MEAL, Disp: 60 capsule, Rfl: 0 .  pramipexole (MIRAPEX) 0.25 MG tablet, Take 3 tablets (0.75 mg total) by mouth at bedtime., Disp: 90 tablet, Rfl: 11 .  PREZCOBIX 800-150 MG tablet, TAKE 1 TABLET BY MOUTH EVERY DAY, Disp: 30 tablet, Rfl: 5 .  PROAIR HFA 108 (90 Base) MCG/ACT inhaler, USE 1 TO 2 PUFFS EVERY 4 TO 6 HOURS AS NEEDED SHORTNESS OF BREATH OR WHEEZING, Disp: 8.5 g, Rfl: 1 .  prochlorperazine (COMPAZINE) 10 MG tablet, Take 1 tablet (10 mg total) by mouth every 6 (six) hours as needed for nausea or vomiting., Disp: 30 tablet, Rfl: 11 .  Tamsulosin HCl (FLOMAX) 0.4 MG CAPS, Take 0.4 mg by mouth daily. 1-2 daily, Disp: , Rfl:  .  tiZANidine (ZANAFLEX) 4 MG tablet,  TAKE 1 TABLET BY MOUTH 2 TIMES A DAY AS NEEDED FOR MUSCLE SPASMS, Disp: 60 tablet, Rfl: 11 .  TRANSDERM-SCOP, 1.5 MG, 1 MG/3DAYS, PLACE 1 PATCH ON SKIN EVERY 3 DAYS, Disp: 10 patch, Rfl: 11 .  VOLTAREN 1 % GEL, APPLY 4 GRAMS TOPICALLY 4 TIMES DAILY, Disp: 100 g, Rfl: 1 .  zolmitriptan (ZOMIG) 5 MG tablet, Take 1 tab at onset of migraine.  May repeat, with 1 tab, in 2 hrs, if needed.  Max dose of 2 tabs/24hrs.  Must last 30 days. #12/30, Disp: 12 tablet, Rfl: 11 .  zolpidem (AMBIEN) 10 MG tablet, TAKE ONE TABLET BY MOUTH AT BEDTIME AS NEEDED FOR SLEEP, Disp: 15 tablet, Rfl: 1 .  cephALEXin (KEFLEX) 500 MG capsule, Take 1 capsule (500 mg total) by mouth 3 (three) times daily. (Patient not taking: Reported on 09/25/2017), Disp: 21 capsule, Rfl: 0     Review of Systems  Constitutional: Negative for activity change, appetite change, chills, fever and unexpected weight change.  HENT: Negative for congestion, facial swelling, postnasal drip, rhinorrhea, sinus pressure, sneezing, sore throat and trouble swallowing.   Eyes: Negative for photophobia and visual disturbance.  Respiratory: Negative for apnea, cough, chest tightness, shortness of breath, wheezing and stridor.   Cardiovascular: Negative for chest pain and leg swelling.  Gastrointestinal: Negative for abdominal distention, abdominal pain, anal bleeding, blood in stool, constipation, diarrhea, nausea and vomiting.  Genitourinary: Positive for vaginal discharge. Negative for difficulty urinating, dysuria, flank pain and hematuria.  Musculoskeletal: Negative for arthralgias, gait problem and joint swelling.  Skin: Positive for rash. Negative for color change, pallor and wound.  Neurological: Positive for headaches. Negative for dizziness, tremors, weakness and light-headedness.  Hematological: Negative for adenopathy. Does not bruise/bleed easily.  Psychiatric/Behavioral: Negative for agitation, behavioral problems, confusion, dysphoric mood,  hallucinations, self-injury, sleep disturbance and suicidal ideas.  The patient is not hyperactive.        Objective:   Physical Exam  Constitutional: She is oriented to person, place, and time. She appears well-developed and well-nourished. No distress.  HENT:  Head: Normocephalic and atraumatic.  Mouth/Throat: Oropharynx is clear and moist. No oropharyngeal exudate, posterior oropharyngeal edema, posterior oropharyngeal erythema or tonsillar abscesses.  Eyes: Pupils are equal, round, and reactive to light. Conjunctivae and EOM are normal. No scleral icterus.  Neck: Normal range of motion. Neck supple. No JVD present.  Cardiovascular: Normal rate and regular rhythm.  Pulmonary/Chest: Effort normal. No respiratory distress. She has no wheezes.  Abdominal: She exhibits no distension.  Musculoskeletal: She exhibits no edema or tenderness.  Lymphadenopathy:    She has no cervical adenopathy.  Neurological: She is alert and oriented to person, place, and time. She has normal reflexes. She exhibits normal muscle tone. Coordination normal.  Skin: Skin is warm and dry. She is not diaphoretic. No erythema. No pallor.  Psychiatric: She has a normal mood and affect. Her speech is normal. Judgment and thought content normal. Cognition and memory are normal.  Nursing note and vitals reviewed.         Assessment & Plan:  HIV: Consolidate to [facility] and recheck labs in 2 months  Boils: She did not want to have them examined but asked for prescription for Keflex which I gave her.   Migraines: followed closely by Neurology  Recurrent yeast infections: I have counseled her about the need to be judicious with fluconazole due to the risk of resistant species such as Candida glabrata emerging.  Type 2 diabetes mellitus: She is been able to gain greater control with weight loss though she now has gained weight again.  She is being followed by internal medicine clinic.  I spent greater than 25  minutes with the patient including greater than 50% of time in face to face counsel of the patient are in nature of her HIV infection prior failure with resistance ability now to consolidate her to a single tablet regimen, nature of undetectable equals on transmissible, regarding the conferences of overuse of antibiotics and antifungals and in coordination of her care.

## 2017-10-03 ENCOUNTER — Telehealth: Payer: Self-pay

## 2017-10-03 NOTE — Telephone Encounter (Signed)
We received a prior authorization request for this medication. I have completed the form for [employer] and put on Dr. Rhea Belton desk for her signature.

## 2017-10-04 NOTE — Telephone Encounter (Signed)
Decision pending w/ [place] Medicaid.

## 2017-10-04 NOTE — Telephone Encounter (Signed)
Checked on status of [id number] - decision still pending.  Pt PP#[id number] S.

## 2017-10-04 NOTE — Telephone Encounter (Signed)
Pt has called asking for a Prior Authorization on her zolmitriptan (ZOMIG) 5 MG tablet pt was made aware of entry from [place] on yesterday.  Pt is asking for a call once completed

## 2017-10-05 NOTE — Telephone Encounter (Signed)
Spoke to patient - she is aware the medication has been approved.

## 2017-10-05 NOTE — Telephone Encounter (Signed)
Zolmitriptan approved through 09/29/2018.

## 2017-10-11 DIAGNOSIS — E119 Type 2 diabetes mellitus without complications: Secondary | ICD-10-CM | POA: Diagnosis not present

## 2017-10-12 ENCOUNTER — Other Ambulatory Visit: Payer: Self-pay | Admitting: Internal Medicine

## 2017-10-12 DIAGNOSIS — G4700 Insomnia, unspecified: Secondary | ICD-10-CM

## 2017-10-13 NOTE — Telephone Encounter (Signed)
Ambien rx called to [facility] at [place].

## 2017-10-16 ENCOUNTER — Telehealth: Payer: Self-pay | Admitting: *Deleted

## 2017-10-16 NOTE — Telephone Encounter (Signed)
PA for Ajovy approved by [facility] Medicaid - YO#[id number].  Pt LT#[id number] S.

## 2017-10-17 NOTE — Telephone Encounter (Signed)
The PA has been approved and the pharmacy has been notified. Patient has [place] Medicaid - PAs are not submitted through covermymeds.

## 2017-10-17 NOTE — Telephone Encounter (Signed)
Cover my meds requesting a call to discuss PA for Ajovy, key reference [id number] please contact at [phone]

## 2017-10-19 ENCOUNTER — Other Ambulatory Visit: Payer: Self-pay | Admitting: Sports Medicine

## 2017-10-27 ENCOUNTER — Other Ambulatory Visit: Payer: Self-pay | Admitting: *Deleted

## 2017-10-27 DIAGNOSIS — G4700 Insomnia, unspecified: Secondary | ICD-10-CM

## 2017-10-27 MED ORDER — ZOLPIDEM TARTRATE 10 MG PO TABS: ORAL_TABLET | ORAL | 0 refills | Status: DC

## 2017-11-01 ENCOUNTER — Other Ambulatory Visit: Payer: Self-pay | Admitting: Internal Medicine

## 2017-11-01 DIAGNOSIS — I10 Essential (primary) hypertension: Secondary | ICD-10-CM

## 2017-11-03 ENCOUNTER — Encounter: Payer: Medicaid Other | Admitting: Internal Medicine

## 2017-11-17 ENCOUNTER — Other Ambulatory Visit: Payer: Self-pay | Admitting: Neurology

## 2017-11-17 ENCOUNTER — Other Ambulatory Visit: Payer: Self-pay | Admitting: Sports Medicine

## 2017-11-20 ENCOUNTER — Other Ambulatory Visit: Payer: Self-pay | Admitting: *Deleted

## 2017-11-20 DIAGNOSIS — G4700 Insomnia, unspecified: Secondary | ICD-10-CM

## 2017-11-20 MED ORDER — ZOLPIDEM TARTRATE 10 MG PO TABS: ORAL_TABLET | ORAL | 0 refills | Status: DC

## 2017-11-21 ENCOUNTER — Other Ambulatory Visit: Payer: Self-pay | Admitting: Internal Medicine

## 2017-11-21 NOTE — Telephone Encounter (Signed)
Patient requesting a refill on amiben, pls [facility]

## 2017-11-21 NOTE — Telephone Encounter (Signed)
This med was refilled 11/20/2017. Patient made aware. Hubbard Hartshorn, RN, BSN

## 2017-11-23 ENCOUNTER — Ambulatory Visit: Payer: Medicaid Other | Admitting: Internal Medicine

## 2017-11-23 ENCOUNTER — Other Ambulatory Visit: Payer: Self-pay

## 2017-11-23 ENCOUNTER — Encounter: Payer: Self-pay | Admitting: Internal Medicine

## 2017-11-23 VITALS — BP 120/61 | HR 83 | Temp 98.5°F | Ht 66.0 in | Wt 287.4 lb

## 2017-11-23 DIAGNOSIS — Z79899 Other long term (current) drug therapy: Secondary | ICD-10-CM

## 2017-11-23 DIAGNOSIS — K5900 Constipation, unspecified: Secondary | ICD-10-CM

## 2017-11-23 DIAGNOSIS — Z7984 Long term (current) use of oral hypoglycemic drugs: Secondary | ICD-10-CM | POA: Diagnosis not present

## 2017-11-23 DIAGNOSIS — E118 Type 2 diabetes mellitus with unspecified complications: Secondary | ICD-10-CM

## 2017-11-23 DIAGNOSIS — G4700 Insomnia, unspecified: Secondary | ICD-10-CM

## 2017-11-23 DIAGNOSIS — E119 Type 2 diabetes mellitus without complications: Secondary | ICD-10-CM

## 2017-11-23 DIAGNOSIS — B3731 Acute candidiasis of vulva and vagina: Secondary | ICD-10-CM

## 2017-11-23 DIAGNOSIS — Z8742 Personal history of other diseases of the female genital tract: Secondary | ICD-10-CM | POA: Diagnosis not present

## 2017-11-23 DIAGNOSIS — G2581 Restless legs syndrome: Secondary | ICD-10-CM

## 2017-11-23 DIAGNOSIS — K5909 Other constipation: Secondary | ICD-10-CM | POA: Diagnosis present

## 2017-11-23 DIAGNOSIS — B373 Candidiasis of vulva and vagina: Secondary | ICD-10-CM | POA: Diagnosis not present

## 2017-11-23 NOTE — Patient Instructions (Addendum)
Thank you for allowing [place] to provide your care. I will get your prescriptions sent out and look into prescription medications for constipation. I will see you back in 3 months. Keep up the great work with your diabetes.

## 2017-11-23 NOTE — Assessment & Plan Note (Signed)
The patient continues to struggle with insomnia. Continues to be both initiation and maintenance. She is currently on Ambien which she states is helping. She does not use it nightly but only when she cannot get to sleep. She is continuing to work on sleep hygiene. We will continue to discuss the risk/benefits of Ambien.

## 2017-11-23 NOTE — Progress Notes (Signed)
   CC: Constipation   HPI:  [name] is a 52 y.o. female who presented to the clinic for continued evaluation and management of her chronic medical illnesses. For a detailed assessment and plan please refer to problem based charting below.   Past Medical History:  Diagnosis Date  . Acute gastritis without mention of hemorrhage 01/01/2002  . Anemia 10/2009   macrocytic anemia with baseline MCV 104-106  . Anxiety   . Asthma   . Boil 01/05/2017  . 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 Complex tear of medial meniscus of left knee as current injury 02/25/2016  . Complication of anesthesia    hard to wake up  . COPD (chronic obstructive pulmonary disease) (HCC)    chronic bronchitis  . Derangement of anterior horn of lateral meniscus of left knee 02/25/2016  . Duodenitis without mention of hemorrhage 01/01/2002  . DVT (deep venous thrombosis) (HCC)    history clot lt groin when she had lymphoma-in remision now.  . Elevated triglycerides with high cholesterol 01/05/2017  . Gastroparesis   . GERD (gastroesophageal reflux disease)   . Hiatal hernia 01/01/2002  . HIV infection ([place])    CD4 = 570 (05/2010), VL undetectation  . Hyperlipidemia   . IBS (irritable bowel syndrome)   . Migraines    on topamax and triptans, frequent (almost daily) attacks   . 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 Beryle Beams is her oncologist.   . PUD (peptic ulcer disease) 07/1997   per EGD report 07/1997 with history of esophagitis  . Restless leg syndrome 12/24/2015  . Shingles    in lumbar dermatome  . Spondylolisthesis of lumbar region    L5-S1  . TMJ (dislocation of temporomandibular joint)   . Traumatic tear of lateral meniscus of right knee 04/20/2012  . Vaginal yeast infection 09/25/2017   Review of Systems:  Denies chest pain, SOB  Denies N/V, abdominal pain   Physical Exam: Vitals:   11/23/17 1429  BP: 120/61  Pulse: 83  Temp: 98.5 F  (36.9 C)  TempSrc: Oral  SpO2: 99%  Weight: 287 lb 6.4 oz (130.4 kg)  Height: 5\' 6"  (1.676 m)   General: Obese female in no acute distress Pulm: Good air movement with no wheezing or crackles  CV: RRR, no murmurs, no rubs  Abdomen: Active bowel sounds, soft, non-distended Skin: Warm and dry  Neuro: Alert and oriented x 3  Assessment & Plan:   See Encounters Tab for problem based charting.  Patient discussed with Dr. Beryle Beams

## 2017-11-23 NOTE — Assessment & Plan Note (Addendum)
Recently seen by endocrinology for management of her DM. She is on [place]. Her recent A1c was 7.1%. She continues to work on lifestyle changes and weight loss. She has loss 16 lbs. We will continue her current medications and she will continue to follow with Endocrinology. We will follow-up with her opthamologist for a diabetic eye exam.   Plan: - Continue Farxiga and Januvia. Refills sent today for 6 months.

## 2017-11-23 NOTE — Assessment & Plan Note (Signed)
>>  ASSESSMENT AND PLAN FOR TYPE 2 DIABETES MELLITUS (HCC) WRITTEN ON 11/26/2017  4:28 PM BY HELBERG, JUSTIN, MD  Recently seen by endocrinology for management of her DM. She is on [place]. Her recent A1c was 7.1%. She continues to work on lifestyle changes and weight loss. She has loss 16 lbs. We will continue her current medications and she will continue to follow with Endocrinology. We will follow-up with her opthamologist for a diabetic eye exam.   Plan: - Continue Farxiga and Januvia. Refills sent today for 6 months.

## 2017-11-26 ENCOUNTER — Encounter: Payer: Self-pay | Admitting: Internal Medicine

## 2017-11-26 MED ORDER — DAPAGLIFLOZIN PROPANEDIOL 10 MG PO TABS: 10.0000 mg | ORAL_TABLET | Freq: Every day | ORAL | 1 refills | Status: DC

## 2017-11-26 MED ORDER — SITAGLIPTIN PHOSPHATE 100 MG PO TABS: 100.0000 mg | ORAL_TABLET | Freq: Every day | ORAL | 1 refills | Status: DC

## 2017-11-26 MED ORDER — ZOLPIDEM TARTRATE 10 MG PO TABS: ORAL_TABLET | ORAL | 2 refills | Status: DC

## 2017-11-26 MED ORDER — PLECANATIDE 3 MG PO TABS: 3.0000 mg | ORAL_TABLET | Freq: Every day | ORAL | 0 refills | Status: DC

## 2017-11-26 MED ORDER — DICLOFENAC SODIUM 1 % TD GEL: TRANSDERMAL | 1 refills | Status: DC

## 2017-11-26 NOTE — Assessment & Plan Note (Signed)
Patient voices that she has struggled with chronic constipation for several years. She has seen GI in the past and has approximately 1 bowel movement every month. She has tried several stool softeners and laxatives without relief. She is also taking fiber daily. She is requesting prescription medications to help with her functional constipation. Unfortunately long term data is not available for the majority of prescription medications on the market. She understands but would like to start one. Given the patient's multiple co-morbidies and medications we will need to be careful in our selection. Options include Linaclotide, Plecanatide, and Lubiprostone. There does not appear to be any major interactions (with medications) or contraindications to either of these medications. All appear to be covered by her insurance and are generic. The Lubiprostone is BID dosing therefore for compliance we will not start with this medication. We will try Plecanatide 3 mg daily.

## 2017-11-26 NOTE — Assessment & Plan Note (Signed)
Patient continues to voice concerns about recurrent vaginal yeast infections. She is following with ID and recent cultures have grown Candida glabrata. Dr. Tommy Medal has discussed judicious use of fluconazole in the future. She states that she will monitor for symptoms and if she experiences vaginal pain and itching she will present to ID's office for further evaluation. With that being said she is requesting a prescription of fluconazole to have on hand incase her symptoms resolve. She is adamant that she understands the risk of having a resistant organism and has talked with ID about it.

## 2017-11-26 NOTE — Assessment & Plan Note (Signed)
Voltaren gel refill sent. 

## 2017-11-28 ENCOUNTER — Telehealth: Payer: Self-pay | Admitting: Internal Medicine

## 2017-11-28 ENCOUNTER — Other Ambulatory Visit: Payer: Medicaid Other

## 2017-11-28 NOTE — Progress Notes (Signed)
Medicine attending: Medical history, presenting problems, physical findings, and medications, reviewed with resident physician Dr Justin Helberg on the day of the patient visit and I concur with his evaluation and management plan. 

## 2017-11-28 NOTE — Telephone Encounter (Signed)
Called the patient to discussed her Plecanatide prescription sent out. Wanted to discussed potential side effects including diarrhea. Will try again later.

## 2017-11-29 NOTE — Telephone Encounter (Addendum)
Received PA request from pt's pharmacy stating trulance tabs are non-preferred (medicaid) .  I will send list of preferred medicatios to MD for review and medication change, if appropriate. Please advise.Regenia Skeeter, Geneveive Furness Cassady5/29/20199:46 AM  SELECTIVE CONSTIPATION AGENTS  Preferred       Amitiza capsule           Linzess capsule  Movantik  Tablet

## 2017-11-30 ENCOUNTER — Telehealth: Payer: Self-pay | Admitting: Neurology

## 2017-11-30 ENCOUNTER — Other Ambulatory Visit: Payer: Self-pay | Admitting: Neurology

## 2017-11-30 ENCOUNTER — Telehealth: Payer: Self-pay | Admitting: Internal Medicine

## 2017-11-30 ENCOUNTER — Other Ambulatory Visit: Payer: Self-pay | Admitting: Internal Medicine

## 2017-11-30 DIAGNOSIS — B373 Candidiasis of vulva and vagina: Secondary | ICD-10-CM

## 2017-11-30 DIAGNOSIS — K5900 Constipation, unspecified: Secondary | ICD-10-CM

## 2017-11-30 DIAGNOSIS — G2581 Restless legs syndrome: Secondary | ICD-10-CM

## 2017-11-30 DIAGNOSIS — B3731 Acute candidiasis of vulva and vagina: Secondary | ICD-10-CM

## 2017-11-30 MED ORDER — TERCONAZOLE 0.4 % VA CREA: 1.0000 | TOPICAL_CREAM | Freq: Every day | VAGINAL | 0 refills | Status: DC

## 2017-11-30 MED ORDER — DICLOFENAC SODIUM 1 % TD GEL: TRANSDERMAL | 5 refills | Status: DC

## 2017-11-30 MED ORDER — LINACLOTIDE 145 MCG PO CAPS: 145.0000 ug | ORAL_CAPSULE | Freq: Every day | ORAL | 1 refills | Status: DC

## 2017-11-30 NOTE — Telephone Encounter (Signed)
I called to request a refill for the patients botox. A new script was needed so we placed one with the pharmacist.

## 2017-11-30 NOTE — Telephone Encounter (Signed)
Called patient to discuss Linzess and possible side effects. She understands and will follow-up in 3 months.

## 2017-11-30 NOTE — Addendum Note (Signed)
Addended by: Ina Homes T on: 11/30/2017 01:57 PM   Modules accepted: Orders

## 2017-11-30 NOTE — Progress Notes (Signed)
Patient's insurance does not cover Plecanatide. Will switch to [facility] for better insurance coverage.

## 2017-12-11 ENCOUNTER — Ambulatory Visit: Payer: Medicaid Other | Admitting: Infectious Disease

## 2017-12-18 ENCOUNTER — Other Ambulatory Visit: Payer: Medicaid Other

## 2017-12-20 ENCOUNTER — Other Ambulatory Visit: Payer: Medicaid Other

## 2017-12-20 ENCOUNTER — Encounter: Payer: Self-pay | Admitting: Neurology

## 2017-12-20 ENCOUNTER — Ambulatory Visit: Payer: Medicaid Other | Admitting: Neurology

## 2017-12-20 ENCOUNTER — Other Ambulatory Visit (HOSPITAL_COMMUNITY)
Admission: RE | Admit: 2017-12-20 | Discharge: 2017-12-20 | Disposition: A | Discharge status: Home / Self Care | Payer: Medicaid Other | Source: Ambulatory Visit | Attending: Infectious Disease | Admitting: Infectious Disease

## 2017-12-20 ENCOUNTER — Telehealth: Payer: Self-pay | Admitting: *Deleted

## 2017-12-20 VITALS — BP 117/67 | HR 86 | Ht 66.0 in | Wt 281.0 lb

## 2017-12-20 DIAGNOSIS — G43719 Chronic migraine without aura, intractable, without status migrainosus: Secondary | ICD-10-CM

## 2017-12-20 DIAGNOSIS — B20 Human immunodeficiency virus [HIV] disease: Secondary | ICD-10-CM | POA: Diagnosis not present

## 2017-12-20 NOTE — Telephone Encounter (Addendum)
Prior authorization started for zolmitriptan over phone with [place] Medicaid [phone]) - decision pending.  [place]#[id number].  Prior authorization started for Ajovy by completing required form and faxing to [facility] [phone] - decision pending.  ZU#[id number].  Pt Medicaid PP#[id number] S.

## 2017-12-20 NOTE — Progress Notes (Signed)
**  Botox 100 units x 2 vials, NDC [id number], Lot [id number], Exp 04/2020, specialty pharmacy.//mck,rn**

## 2017-12-20 NOTE — Progress Notes (Signed)
PATIENT: [name] DOB: Aug 01, 1965  Chief Complaint  Patient presents with  . Migraine    Botox 100 units x 2 vials - specialty pharmacy     HISTORICAL  [name], Is accompanied by her sister returned for Botox injection as migraine prevention  She has past medical history of HIV, hyperlipidemia, migraines, depression, and non-Hodgkin lymphoma in 2005 with chemotherapy and radiation.   She has history of migraines since 19. In the last 10 years, she reports having daily migraines. She is currently taking Topamax and Zomig with relief. She takes approximately 20-25 tablets of her Zomig per month.    She has aura's that look like floating dirt particles, flashing lights, and she sees Christmas tree lights when they are severe, she has nausea, vomiting and sometimes diarrhea when they're severe. She also complains of diplopia, blurry vision, and  photophobia. She also has had a lightning bolt sensation throughout her body.  She describes them as pulsating and throbbing  on a scale of 5-6/10 which she considers mild, when they are severe they can go as high as 10/10. They usually start in the right temporal area, but will also start on the left temporal and are worse.   Any type of smoke or MSG can precipitate a migraine. They generally last 2-9 days. She has been to the emergency department twice in the last year for migraines. She states "I just can't do this anymore I need to do something about her migraines"  She has tried and failed different preventive medications:  atenolol, verapamil, nortriptyline, Inderal, Depakote without benefit. Atenolol and verapamil caused hypotension. Depakote caused severe vomiting. Topamax causing her worsening headaches, will show tried Zonegran, Topamax ER, she denies significant improvement.  For abortive treatment, she has tried Imitrex, Maxalt with suboptimal response, Zomig as needed since to work best,  she also takes transderm scophalamine  patch for nause  I started to give her BOTOX injection since May 2013, every 3 months, she responded very well,   Previous MRI brain was normal in 2013.  Last Botox injection as migraine prevention was March 2017, she responded very well, but in recent few weeks, she noticed increased migraine headaches, this baby had headache left retro-orbital area severe pounding headache with associated light noise sensitivity, has been ongoing for 3 days, failed to respond to Zomig by mouth, Imitrex injection as needed, she has constant dry heaves, difficulty opening her eyes, In addition, today she complains of restless leg symptoms, difficult to stand or lying still for extended period of time, has tried Neurontin-cause nausea, confusion,  Lyrica-cause excessive weight gain, she has never tried dopamine agonist in the past  I reviewed laboratory in April 2017, normal hemoglobin 12 point 9, mild elevated RDW 15.8, decreased MCV   Update April 27 2016  Last Botox injection was in June 2017, she had left knee arthroscopic surgery in August 2017, complaining of significant joints pain, unsteady gait, before Botox she was having moderate to severe headache daily, even Botox now, she used to 12 tablets of Zomig every months because of frequent moderate to severe headache  Update August 10 2016: Last Botox injection was in October 2017, has been very helpful, but over the past 3-4 weeks, she had frequent almost daily headaches, oftentimes wake her up from sleep, Zomig has been very helpful,  Low dose Mirapex 0.25 milligrams 2 tablets every night has been very helpful for her restless leg symptoms, she reported 80% improvement, laboratory  reviewed in January 2018, CD4 count 760, normal hemoglobin 12.3, mildly decreased MCV, increased RDW,  Update Nov 09 2016: She suffered bilateral ear infection, strep throat since last visit, had frequent headaches, nausea,  Update February 22 2017: She responded well to  previous injection, complains of a lot of stress at home  UPDATE May 31 2017: She complains suboptimal response to Botox injection, had daily headache over last week, also complains of a lot of stress at home, average more than 4 headaches in 1 week, lasting all day, with associated nausea vomiting, is taking Zomig as needed, with partial response  UPDATE September 08 2017: She complains of excessive stress, frequent migraine headaches,  UPDATE December 20 2017: She is accompanied by her sister at today's clinical visit, Ajovy injection has helped her migraine, she rated as 25%, but with the combination of Ajovy and Botox injection, she still has 3 migraine headaches each week, has been dealing with daily headaches over the past 1 week, required multiple dose of Zomig treatment  REVIEW OF SYSTEMS: Full 14 system review of systems performed and notable only for as above  ALLERGIES: Allergies  Allergen Reactions  . Diazepam     Triggers migraines  . Divalproex Sodium Nausea And Vomiting    Causes light-headedness  . Gabapentin Nausea And Vomiting    confusion  . Ondansetron Hcl Nausea And Vomiting    Triggers migraines, "makes me vomit"  . Penicillins     REACTION: lips swollen to point of bleeding, sever vaginal irritation  Reports tolerating augmentin well without any complaints or reaction  . Pineapple   . Propoxyphene N-Acetaminophen Nausea And Vomiting  . Simvastatin     PATIENT CANNOT BE PRESCRIBED THIS STATIN WHILE RECEIVING NORVIR  . Topamax [Topiramate]     Migraines   . Tramadol Hcl Itching    REACTION: migraines and itching--hx of migraines even without medication   . Zocor [Simvastatin - High Dose]     HOME MEDICATIONS: Current Outpatient Medications  Medication Sig Dispense Refill  . Beclomethasone Dipropionate 80 MCG/ACT AERS Place 2 Inhalers into the nose daily. 8.7 g 10  . blood glucose meter kit and supplies Dispense based on patient and insurance preference. Use up  to four times daily as directed. (FOR ICD-9 250.00, 250.01). 1 each 0  . BOTOX 100 units SOLR injection MD IS TO INJECT 200 UNITS INTRAMUSCULARLY INTO HEAD AND NECK MUSCLES EVERY 3 MONTHS 2 each 3  . Calcium Carbonate-Vitamin D (CALCIUM-VITAMIN D) 500-200 MG-UNIT per tablet Take 2 tablets by mouth daily.     . dapagliflozin propanediol (FARXIGA) 10 MG TABS tablet Take 10 mg by mouth daily. 90 tablet 1  . Darunavir-Cobicisctat-Emtricitabine-Tenofovir Alafenamide (SYMTUZA) 800-150-200-10 MG TABS Take 1 tablet by mouth daily with breakfast. 30 tablet 11  . diclofenac sodium (VOLTAREN) 1 % GEL APPLY 4 GRAMS TOPICALLY 4 TIMES DAILY 100 g 5  . DULERA 100-5 MCG/ACT AERO USE 2 PUFFS BY MOUTH THE FIRST THING IN THE MORNING AND THEN ANOTHER 2 PUFFS ABOUT 12 HOURS LATER 13 g 5  . fexofenadine (ALLEGRA) 180 MG tablet Take 1 tablet (180 mg total) by mouth daily. 30 tablet 2  . fluconazole (DIFLUCAN) 100 MG tablet Take 2 tablets (200 mg total) by mouth daily. 20 tablet 0  . fluocinonide (LIDEX) 0.05 % external solution 1 gtt AU qD prn itching    . fluticasone (FLONASE) 50 MCG/ACT nasal spray USE 2 SPRAYS IN BOTH NOSTRILS EVERY DAY 16 g 0  .  Fremanezumab-vfrm (AJOVY) 225 MG/1.5ML SOSY Inject 225 mg into the skin every 30 (thirty) days. 1 Syringe 11  . glucose blood test strip Use as instructed 100 each 12  . hydrOXYzine (ATARAX/VISTARIL) 25 MG tablet Take 1 tablet (25 mg total) by mouth at bedtime. 30 tablet 2  . linaclotide (LINZESS) 145 MCG CAPS capsule Take 1 capsule (145 mcg total) by mouth daily before breakfast. 90 capsule 1  . lisinopril (PRINIVIL,ZESTRIL) 10 MG tablet TAKE ONE TABLET BY MOUTH EACH DAY FOR BLOOD PRESSURE 90 tablet 0  . NEXIUM 40 MG capsule TAKE 1 CAPSULE BY MOUTH 2 TIMES DAILY BEFORE A MEAL 60 capsule 0  . pramipexole (MIRAPEX) 0.25 MG tablet Take 3 tablets (0.75 mg total) by mouth at bedtime. 90 tablet 11  . PROAIR HFA 108 (90 Base) MCG/ACT inhaler USE 1 TO 2 PUFFS EVERY 4 TO 6 HOURS AS  NEEDED SHORTNESS OF BREATH OR WHEEZING 8.5 g 1  . prochlorperazine (COMPAZINE) 10 MG tablet TAKE 1 TABLET BY MOUTH EVERY 6 HOURS AS NEEDED NAUSEA OR VOMITING 30 tablet 11  . sitaGLIPtin (JANUVIA) 100 MG tablet Take 1 tablet (100 mg total) by mouth daily. 90 tablet 1  . Tamsulosin HCl (FLOMAX) 0.4 MG CAPS Take 0.4 mg by mouth daily. 1-2 daily    . terconazole (TERAZOL 7) 0.4 % vaginal cream Place 1 applicator vaginally at bedtime. 45 g 0  . tiZANidine (ZANAFLEX) 4 MG tablet TAKE 1 TABLET BY MOUTH 2 TIMES A DAY AS NEEDED FOR MUSCLE SPASMS 60 tablet 11  . TRANSDERM-SCOP, 1.5 MG, 1 MG/3DAYS PLACE 1 PATCH ON SKIN EVERY 3 DAYS 10 patch 11  . zolmitriptan (ZOMIG) 5 MG tablet Take 1 tab at onset of migraine.  May repeat, with 1 tab, in 2 hrs, if needed.  Max dose of 2 tabs/24hrs.  Must last 30 days. #12/30 12 tablet 11  . zolpidem (AMBIEN) 10 MG tablet TAKE 1 TABLET BY MOUTH EACH NIGHT AT BEDTIME AS NEEDED SLEEP 30 tablet 2   No current facility-administered medications for this visit.     PAST MEDICAL HISTORY: Past Medical History:  Diagnosis Date  . Acute gastritis without mention of hemorrhage 01/01/2002  . Anemia 10/2009   macrocytic anemia with baseline MCV 104-106  . Anxiety   . Asthma   . Boil 01/05/2017  . 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 Complex tear of medial meniscus of left knee as current injury 02/25/2016  . Complication of anesthesia    hard to wake up  . COPD (chronic obstructive pulmonary disease) (HCC)    chronic bronchitis  . Derangement of anterior horn of lateral meniscus of left knee 02/25/2016  . Duodenitis without mention of hemorrhage 01/01/2002  . DVT (deep venous thrombosis) (HCC)    history clot lt groin when she had lymphoma-in remision now.  . Elevated triglycerides with high cholesterol 01/05/2017  . Gastroparesis   . GERD (gastroesophageal reflux disease)   . Hiatal hernia 01/01/2002  . HIV infection ([place])    CD4 = 570 (05/2010), VL undetectation  .  Hyperlipidemia   . IBS (irritable bowel syndrome)   . Migraines    on topamax and triptans, frequent (almost daily) attacks   . Non Hodgkin's lymphoma ([place])    stage II, s/p resection, chemotherapy, radiotherapy, Dr. Beryle Beams is her oncologist.   . PUD (peptic ulcer disease) 07/1997   per EGD report 07/1997 with history of esophagitis  . Restless leg syndrome 12/24/2015  . Shingles  in lumbar dermatome  . Spondylolisthesis of lumbar region    L5-S1  . TMJ (dislocation of temporomandibular joint)   . Traumatic tear of lateral meniscus of right knee 04/20/2012  . Vaginal yeast infection 09/25/2017    PAST SURGICAL HISTORY: Past Surgical History:  Procedure Laterality Date  . CHOLECYSTECTOMY    . ENDOMETRIAL ABLATION    . KNEE ARTHROSCOPY Right 04/20/12  . KNEE ARTHROSCOPY WITH LATERAL MENISECTOMY Left 02/25/2016   Procedure: LEFT KNEE ARTHROSCOPY WITH MEDIAL AND  LATERAL MENISECTOMIES;  Surgeon: Marchia Bond, MD;  Location: [facility];  Service: Orthopedics;  Laterality: Left;  . KNEE ARTHROSCOPY WITH MEDIAL MENISECTOMY Left 02/25/2016   Procedure: KNEE ARTHROSCOPY WITH MEDIAL MENISECTOMY;  Surgeon: Marchia Bond, MD;  Location: [facility];  Service: Orthopedics;  Laterality: Left;  . TONSILLECTOMY    . TYMPANOPLASTY Left   . TYMPANOSTOMY TUBE PLACEMENT     as child-both  . UPPER GASTROINTESTINAL ENDOSCOPY  2003, 2007   done by Dr. Velora Heckler    FAMILY HISTORY: Family History  Problem Relation Age of Onset  . Hypertension Mother   . Stroke Mother   . Heart disease Mother   . Diabetes Mother   . Breast cancer Paternal Grandmother   . Kidney cancer Maternal Uncle   . Heart disease Maternal Uncle   . Diabetes Maternal Uncle   . Diabetes Maternal Grandmother   . Heart disease Maternal Grandmother   . Emphysema Paternal Uncle        never smoker  . Emphysema Paternal Grandfather        never smoker  . Colon cancer Paternal Aunt        with  met to lung   . Non-Hodgkin's lymphoma Maternal Uncle   . Heart disease Maternal Grandfather     SOCIAL HISTORY:  Social History   Socioeconomic History  . Marital status: Divorced    Spouse name: Not on file  . Number of children: 0  . Years of education: 47  . Highest education level: Not on file  Occupational History  . Occupation: disabled    Fish farm manager: UNEMPLOYED  Social Needs  . Financial resource strain: Not on file  . Food insecurity:    Worry: Not on file    Inability: Not on file  . Transportation needs:    Medical: Not on file    Non-medical: Not on file  Tobacco Use  . Smoking status: Never Smoker  . Smokeless tobacco: Never Used  Substance and Sexual Activity  . Alcohol use: No    Alcohol/week: 0.0 oz  . Drug use: No  . Sexual activity: Not Currently    Partners: Male    Birth control/protection: Surgical  Lifestyle  . Physical activity:    Days per week: Not on file    Minutes per session: Not on file  . Stress: Not on file  Relationships  . Social connections:    Talks on phone: Not on file    Gets together: Not on file    Attends religious service: Not on file    Active member of club or organization: Not on file    Attends meetings of clubs or organizations: Not on file    Relationship status: Not on file  . Intimate partner violence:    Fear of current or ex partner: Not on file    Emotionally abused: Not on file    Physically abused: Not on file    Forced sexual activity:  Not on file  Other Topics Concern  . Not on file  Social History Narrative      Patient is disabled.   Right handed.   Caffeine - Diet soda., tea   Education - high school     PHYSICAL EXAM   Vitals:   12/20/17 1409  BP: 117/67  Pulse: 86  Weight: 281 lb (127.5 kg)  Height: '5\' 6"'$  (1.676 m)    Not recorded      Body mass index is 45.35 kg/m.  PHYSICAL EXAMNIATION:  Gen: NAD, conversant, well nourised, obese, well groomed                       Cardiovascular: Regular rate rhythm, no peripheral edema, warm, nontender. Eyes: Conjunctivae clear without exudates or hemorrhage Neck: Supple, no carotid bruise. Pulmonary: Clear to auscultation bilaterally   NEUROLOGICAL EXAM:  MENTAL STATUS: Speech:    Speech is normal; fluent and spontaneous with normal comprehension.  Cognition:     Orientation to time, place and person     Normal recent and remote memory     Normal Attention span and concentration     Normal Language, naming, repeating,spontaneous speech     Fund of knowledge   CRANIAL NERVES: CN II: Visual fields are full to confrontation. Fundoscopic exam is normal with sharp discs and no vascular changes. Pupils are round equal and briskly reactive to light. CN III, IV, VI: extraocular movement are normal. No ptosis. CN V: Facial sensation is intact to pinprick in all 3 divisions bilaterally. Corneal responses are intact.  CN VII: Face is symmetric with normal eye closure and smile. CN VIII: Hearing is normal to rubbing fingers CN IX, X: Palate elevates symmetrically. Phonation is normal. CN XI: Head turning and shoulder shrug are intact CN XII: Tongue is midline with normal movements and no atrophy.  MOTOR: There is no pronator drift of out-stretched arms. Muscle bulk and tone are normal. Muscle strength is normal.  REFLEXES: Reflexes are 2+ and symmetric at the biceps, triceps, knees, and ankles. Plantar responses are flexor.  SENSORY: Intact to light touch, pinprick, positional sensation and vibratory sensation are intact in fingers and toes.  COORDINATION: Rapid alternating movements and fine finger movements are intact. There is no dysmetria on finger-to-nose and heel-knee-shin.    GAIT/STANCE: Posture is normal. Gait is steady with normal steps, base, arm swing, and turning. Heel and toe walking are normal. Tandem gait is normal.  Romberg is absent.  DIAGNOSTIC DATA (LABS, IMAGING, TESTING) - I reviewed  patient records, labs, notes, testing and imaging myself where available.   ASSESSMENT AND PLAN  BETTIE CAPISTRAN is a 52 y.o. female    History of HIV, hypercholesterolemia, Non hodgkin' lymphoma with chemotherapy and radiation and migraines since age 41.  Chronic migraine headaches She has tried and failed multiple preventive medications including atenolol, verapamil, nortriptyline, and Depakote,Topamax, Topamax ER, could not tolerate Cardizem,  She has migraines daily for more than  10 years. She is currently taking  zonogram  and Zomig with some relief. Previously tried imitrex and Maxalt with suboptimal response,  Normal MRI brain, and neurological exam.  BOTOX injection was performed according to protocol by [facility]. 100 units of BOTOX was dissolved into 2 cc NS  Used total 200 units  BOTOX injection was performed according to protocol by [facility]. 100 units of BOTOX was dissolved into 2 cc NS.     I have skipped bilateral corrugate and Procerues  injection today to avoid the long-term side effect Extra 60 units was injected into bilateral temporal parietal region  Patient tolerate the injection well.  I have suggested her continue AJOVY monthly injection    Restless leg symptoms   Continue Mirapex 0.2 5 mg,  3 tablets every night  Ferritin 51 in Feb 2018,     Marcial Pacas, M.D. Ph.D.  [facility] [address] [place], [place] [address] Ph: [phone] Fax: [phone]  CC: Referring Provider

## 2017-12-21 LAB — URINALYSIS, ROUTINE W REFLEX MICROSCOPIC
BILIRUBIN URINE: NEGATIVE
Hgb urine dipstick: NEGATIVE
Ketones, ur: NEGATIVE
LEUKOCYTES UA: NEGATIVE
Nitrite: NEGATIVE
PH: 6.5 (ref 5.0–8.0)
Protein, ur: NEGATIVE
Specific Gravity, Urine: 1.03 (ref 1.001–1.03)

## 2017-12-21 LAB — T-HELPER CELL (CD4) - (RCID CLINIC ONLY)
CD4 % Helper T Cell: 35 % (ref 33–55)
CD4 T CELL ABS: 960 /uL (ref 400–2700)

## 2017-12-21 LAB — LIPID PANEL
CHOLESTEROL: 259 mg/dL — AB (ref <200)
HDL: 34 mg/dL — AB (ref >50)
LDL Cholesterol (Calc): 172 mg/dL (calc) — ABNORMAL HIGH
NON-HDL CHOLESTEROL (CALC): 225 mg/dL — AB (ref <130)
TRIGLYCERIDES: 311 mg/dL — AB (ref <150)
Total CHOL/HDL Ratio: 7.6 (calc) — ABNORMAL HIGH (ref <5.0)

## 2017-12-21 LAB — URINE CYTOLOGY ANCILLARY ONLY
CHLAMYDIA, DNA PROBE: NEGATIVE
NEISSERIA GONORRHEA: NEGATIVE

## 2017-12-21 LAB — MICROALBUMIN / CREATININE URINE RATIO
Creatinine, Urine: 59 mg/dL (ref 20–275)
MICROALB/CREAT RATIO: 5 ug/mg{creat} (ref <30)
Microalb, Ur: 0.3 mg/dL

## 2017-12-21 NOTE — Telephone Encounter (Signed)
PA approved for both medications.  Valid through 12/15/2018.

## 2017-12-24 LAB — HIV-1 RNA QUANT-NO REFLEX-BLD
HIV 1 RNA QUANT: DETECTED {copies}/mL — AB
HIV-1 RNA QUANT, LOG: DETECTED {Log_copies}/mL — AB

## 2018-01-03 ENCOUNTER — Ambulatory Visit (INDEPENDENT_AMBULATORY_CARE_PROVIDER_SITE_OTHER): Payer: Medicaid Other | Admitting: Infectious Disease

## 2018-01-03 ENCOUNTER — Encounter: Payer: Self-pay | Admitting: Infectious Disease

## 2018-01-03 VITALS — BP 115/74 | HR 96 | Temp 98.6°F | Wt 280.0 lb

## 2018-01-03 DIAGNOSIS — B20 Human immunodeficiency virus [HIV] disease: Secondary | ICD-10-CM | POA: Diagnosis present

## 2018-01-03 DIAGNOSIS — E1165 Type 2 diabetes mellitus with hyperglycemia: Secondary | ICD-10-CM

## 2018-01-03 DIAGNOSIS — K219 Gastro-esophageal reflux disease without esophagitis: Secondary | ICD-10-CM | POA: Diagnosis not present

## 2018-01-03 DIAGNOSIS — E782 Mixed hyperlipidemia: Secondary | ICD-10-CM

## 2018-01-03 DIAGNOSIS — G43809 Other migraine, not intractable, without status migrainosus: Secondary | ICD-10-CM

## 2018-01-03 MED ORDER — PANTOPRAZOLE SODIUM 40 MG PO TBEC: 40.0000 mg | DELAYED_RELEASE_TABLET | Freq: Two times a day (BID) | ORAL | 4 refills | Status: DC

## 2018-01-03 NOTE — Progress Notes (Signed)
Chief complaints: refractory hearbearn Subjective:    Patient ID: [name], female    DOB: 06-30-1966, 52 y.o.   MRN: [id number]  HPI  [name] is a 52 y.o. female who is doing superbly well on her  antiviral regimen, SYMTUZA with undetectable viral load and health cd4 count.    Lab Results  Component Value Date   HIV1RNAQUANT <20 DETECTED (A) 12/20/2017   HIV1RNAQUANT <20 NOT DETECTED 08/08/2017   HIV1RNAQUANT <20 DETECTED (A) 12/21/2016    Lab Results  Component Value Date   CD4TABS 960 12/20/2017   CD4TABS 620 08/08/2017   CD4TABS 1,020 12/21/2016   She is having a relatively good birthday but been suffering from regurgitation of acid, along with simultaneous pain in middle of her chest and epigastrium despite taking her prilosec bid.   She has also been suffering from constipation. Her migraines are better controlled.  She is trying to lose weight.  She has several family members with multiple comorbid medical conditions such as hyperlididemia, HTN, DM and she states that her Mother has been ill recently.    Past Medical History:  Diagnosis Date  . Acute gastritis without mention of hemorrhage 01/01/2002  . Anemia 10/2009   macrocytic anemia with baseline MCV 104-106  . Anxiety   . Asthma   . Boil 01/05/2017  . 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 Complex tear of medial meniscus of left knee as current injury 02/25/2016  . Complication of anesthesia    hard to wake up  . COPD (chronic obstructive pulmonary disease) (HCC)    chronic bronchitis  . Derangement of anterior horn of lateral meniscus of left knee 02/25/2016  . Duodenitis without mention of hemorrhage 01/01/2002  . DVT (deep venous thrombosis) (HCC)    history clot lt groin when she had lymphoma-in remision now.  . Elevated triglycerides with high cholesterol 01/05/2017  . Gastroparesis   . GERD (gastroesophageal reflux disease)   . Hiatal hernia 01/01/2002  . HIV infection ([place])    CD4 = 570  (05/2010), VL undetectation  . Hyperlipidemia   . IBS (irritable bowel syndrome)   . Migraines    on topamax and triptans, frequent (almost daily) attacks   . Non Hodgkin's lymphoma ([place])    stage II, s/p resection, chemotherapy, radiotherapy, Dr. Beryle Beams is her oncologist.   . PUD (peptic ulcer disease) 07/1997   per EGD report 07/1997 with history of esophagitis  . Restless leg syndrome 12/24/2015  . Shingles    in lumbar dermatome  . Spondylolisthesis of lumbar region    L5-S1  . TMJ (dislocation of temporomandibular joint)   . Traumatic tear of lateral meniscus of right knee 04/20/2012  . Vaginal yeast infection 09/25/2017   Past Surgical History:  Procedure Laterality Date  . CHOLECYSTECTOMY    . ENDOMETRIAL ABLATION    . KNEE ARTHROSCOPY Right 04/20/12  . KNEE ARTHROSCOPY WITH LATERAL MENISECTOMY Left 02/25/2016   Procedure: LEFT KNEE ARTHROSCOPY WITH MEDIAL AND  LATERAL MENISECTOMIES;  Surgeon: Marchia Bond, MD;  Location: [facility];  Service: Orthopedics;  Laterality: Left;  . KNEE ARTHROSCOPY WITH MEDIAL MENISECTOMY Left 02/25/2016   Procedure: KNEE ARTHROSCOPY WITH MEDIAL MENISECTOMY;  Surgeon: Marchia Bond, MD;  Location: [facility];  Service: Orthopedics;  Laterality: Left;  . TONSILLECTOMY    . TYMPANOPLASTY Left   . TYMPANOSTOMY TUBE PLACEMENT     as child-both  . UPPER GASTROINTESTINAL ENDOSCOPY  2003, 2007   done  by Dr. Velora Heckler   Family History  Problem Relation Age of Onset  . Hypertension Mother   . Stroke Mother   . Heart disease Mother   . Diabetes Mother   . Breast cancer Paternal Grandmother   . Kidney cancer Maternal Uncle   . Heart disease Maternal Uncle   . Diabetes Maternal Uncle   . Diabetes Maternal Grandmother   . Heart disease Maternal Grandmother   . Emphysema Paternal Uncle        never smoker  . Emphysema Paternal Grandfather        never smoker  . Colon cancer Paternal Aunt        with met to lung    . Non-Hodgkin's lymphoma Maternal Uncle   . Heart disease Maternal Grandfather    Social History   Tobacco Use  . Smoking status: Never Smoker  . Smokeless tobacco: Never Used  Substance Use Topics  . Alcohol use: No    Alcohol/week: 0.0 oz  . Drug use: No   Allergies  Allergen Reactions  . Diazepam     Triggers migraines  . Divalproex Sodium Nausea And Vomiting    Causes light-headedness  . Gabapentin Nausea And Vomiting    confusion  . Ondansetron Hcl Nausea And Vomiting    Triggers migraines, "makes me vomit"  . Penicillins     REACTION: lips swollen to point of bleeding, sever vaginal irritation  Reports tolerating augmentin well without any complaints or reaction  . Pineapple   . Propoxyphene N-Acetaminophen Nausea And Vomiting  . Simvastatin     PATIENT CANNOT BE PRESCRIBED THIS STATIN WHILE RECEIVING NORVIR  . Topamax [Topiramate]     Migraines   . Tramadol Hcl Itching    REACTION: migraines and itching--hx of migraines even without medication   . Zocor [Simvastatin - High Dose]      Current Outpatient Medications:  .  Beclomethasone Dipropionate 80 MCG/ACT AERS, Place 2 Inhalers into the nose daily., Disp: 8.7 g, Rfl: 10 .  blood glucose meter kit and supplies, Dispense based on patient and insurance preference. Use up to four times daily as directed. (FOR ICD-9 250.00, 250.01)., Disp: 1 each, Rfl: 0 .  BOTOX 100 units SOLR injection, MD IS TO INJECT 200 UNITS INTRAMUSCULARLY INTO HEAD AND NECK MUSCLES EVERY 3 MONTHS, Disp: 2 each, Rfl: 3 .  Calcium Carbonate-Vitamin D (CALCIUM-VITAMIN D) 500-200 MG-UNIT per tablet, Take 2 tablets by mouth daily. , Disp: , Rfl:  .  dapagliflozin propanediol (FARXIGA) 10 MG TABS tablet, Take 10 mg by mouth daily., Disp: 90 tablet, Rfl: 1 .  Darunavir-Cobicisctat-Emtricitabine-Tenofovir Alafenamide (SYMTUZA) 800-150-200-10 MG TABS, Take 1 tablet by mouth daily with breakfast., Disp: 30 tablet, Rfl: 11 .  diclofenac sodium  (VOLTAREN) 1 % GEL, APPLY 4 GRAMS TOPICALLY 4 TIMES DAILY, Disp: 100 g, Rfl: 5 .  DULERA 100-5 MCG/ACT AERO, USE 2 PUFFS BY MOUTH THE FIRST THING IN THE MORNING AND THEN ANOTHER 2 PUFFS ABOUT 12 HOURS LATER, Disp: 13 g, Rfl: 5 .  fexofenadine (ALLEGRA) 180 MG tablet, Take 1 tablet (180 mg total) by mouth daily., Disp: 30 tablet, Rfl: 2 .  fluconazole (DIFLUCAN) 100 MG tablet, Take 2 tablets (200 mg total) by mouth daily., Disp: 20 tablet, Rfl: 0 .  fluocinonide (LIDEX) 0.05 % external solution, 1 gtt AU qD prn itching, Disp: , Rfl:  .  fluticasone (FLONASE) 50 MCG/ACT nasal spray, USE 2 SPRAYS IN BOTH NOSTRILS EVERY DAY, Disp: 16 g, Rfl: 0 .  Fremanezumab-vfrm (AJOVY) 225 MG/1.5ML SOSY, Inject 225 mg into the skin every 30 (thirty) days., Disp: 1 Syringe, Rfl: 11 .  glucose blood test strip, Use as instructed, Disp: 100 each, Rfl: 12 .  hydrOXYzine (ATARAX/VISTARIL) 25 MG tablet, Take 1 tablet (25 mg total) by mouth at bedtime., Disp: 30 tablet, Rfl: 2 .  linaclotide (LINZESS) 145 MCG CAPS capsule, Take 1 capsule (145 mcg total) by mouth daily before breakfast., Disp: 90 capsule, Rfl: 1 .  lisinopril (PRINIVIL,ZESTRIL) 10 MG tablet, TAKE ONE TABLET BY MOUTH EACH DAY FOR BLOOD PRESSURE, Disp: 90 tablet, Rfl: 0 .  NEXIUM 40 MG capsule, TAKE 1 CAPSULE BY MOUTH 2 TIMES DAILY BEFORE A MEAL, Disp: 60 capsule, Rfl: 0 .  pramipexole (MIRAPEX) 0.25 MG tablet, Take 3 tablets (0.75 mg total) by mouth at bedtime., Disp: 90 tablet, Rfl: 11 .  PROAIR HFA 108 (90 Base) MCG/ACT inhaler, USE 1 TO 2 PUFFS EVERY 4 TO 6 HOURS AS NEEDED SHORTNESS OF BREATH OR WHEEZING, Disp: 8.5 g, Rfl: 1 .  prochlorperazine (COMPAZINE) 10 MG tablet, TAKE 1 TABLET BY MOUTH EVERY 6 HOURS AS NEEDED NAUSEA OR VOMITING, Disp: 30 tablet, Rfl: 11 .  sitaGLIPtin (JANUVIA) 100 MG tablet, Take 1 tablet (100 mg total) by mouth daily., Disp: 90 tablet, Rfl: 1 .  Tamsulosin HCl (FLOMAX) 0.4 MG CAPS, Take 0.4 mg by mouth daily. 1-2 daily, Disp: ,  Rfl:  .  terconazole (TERAZOL 7) 0.4 % vaginal cream, Place 1 applicator vaginally at bedtime., Disp: 45 g, Rfl: 0 .  tiZANidine (ZANAFLEX) 4 MG tablet, TAKE 1 TABLET BY MOUTH 2 TIMES A DAY AS NEEDED FOR MUSCLE SPASMS, Disp: 60 tablet, Rfl: 11 .  TRANSDERM-SCOP, 1.5 MG, 1 MG/3DAYS, PLACE 1 PATCH ON SKIN EVERY 3 DAYS, Disp: 10 patch, Rfl: 11 .  zolmitriptan (ZOMIG) 5 MG tablet, Take 1 tab at onset of migraine.  May repeat, with 1 tab, in 2 hrs, if needed.  Max dose of 2 tabs/24hrs.  Must last 30 days. #12/30, Disp: 12 tablet, Rfl: 11 .  zolpidem (AMBIEN) 10 MG tablet, TAKE 1 TABLET BY MOUTH EACH NIGHT AT BEDTIME AS NEEDED SLEEP, Disp: 30 tablet, Rfl: 2     Review of Systems  Constitutional: Negative for activity change, appetite change, chills, fever and unexpected weight change.  HENT: Negative for congestion, facial swelling, postnasal drip, rhinorrhea, sinus pressure, sneezing, sore throat and trouble swallowing.   Eyes: Negative for photophobia and visual disturbance.  Respiratory: Negative for apnea, cough, chest tightness, shortness of breath, wheezing and stridor.   Cardiovascular: Negative for chest pain and leg swelling.  Gastrointestinal: Positive for abdominal pain and constipation. Negative for abdominal distention, anal bleeding, blood in stool, diarrhea, nausea and vomiting.  Genitourinary: Negative for difficulty urinating, dysuria, flank pain and hematuria.  Musculoskeletal: Negative for arthralgias, gait problem and joint swelling.  Skin: Positive for rash. Negative for color change, pallor and wound.  Neurological: Positive for headaches. Negative for dizziness, tremors, weakness and light-headedness.  Hematological: Negative for adenopathy. Does not bruise/bleed easily.  Psychiatric/Behavioral: Negative for agitation, behavioral problems, confusion, dysphoric mood, hallucinations, self-injury, sleep disturbance and suicidal ideas. The patient is not hyperactive.         Objective:   Physical Exam  Constitutional: She is oriented to person, place, and time. She appears well-developed and well-nourished. No distress.  HENT:  Head: Normocephalic and atraumatic.  Mouth/Throat: Oropharynx is clear and moist. No oropharyngeal exudate, posterior oropharyngeal edema, posterior oropharyngeal erythema or tonsillar  abscesses.  Eyes: Pupils are equal, round, and reactive to light. Conjunctivae and EOM are normal. No scleral icterus.  Neck: Normal range of motion. Neck supple. No JVD present.  Cardiovascular: Normal rate and regular rhythm.  Pulmonary/Chest: Effort normal. No respiratory distress. She has no wheezes.  Abdominal: She exhibits no distension.  Musculoskeletal: She exhibits no edema or tenderness.  Lymphadenopathy:    She has no cervical adenopathy.  Neurological: She is alert and oriented to person, place, and time. She has normal reflexes. She exhibits normal muscle tone. Coordination normal.  Skin: Skin is warm and dry. She is not diaphoretic. No erythema. No pallor.  Psychiatric: She has a normal mood and affect. Her speech is normal. Judgment and thought content normal. She is agitated. Cognition and memory are normal.  Nursing note and vitals reviewed.         Assessment & Plan:  HIV: continue  SYMTUZA. RTC in 6 months   Migraines: followed closely by Neurology  Type 2 diabetes mellitus: followed by Dr Marisa Hua  Worsening GERD: I put her on BID Protonix at 91mmg BID in case she is a hypermetabolizer.  Otherwise I would defer back to Dr. HTarri Abernethyand wonder about more drastic measures though this worsening is hopefully just a transient phenomenon  Obesity: she continues to try to lose weight through diet  Hyperlipidemia: not at goal but she has had trouble with tolerating statins in past. Care needs to be made re interaction of statins with SYMTUZA as COBI will inhibit their metabolism and is contraindicated with statins such as  Simvastatin  I spent greater than 25 minutes with the patient including greater than 50% of time in face to face counsel of the patient re her GERD, symptoms, food, lifestyle changes, different other drugs to rx GERd, re her ARV regimen reviewing her labs and her goals and in coordination of her care.

## 2018-01-11 DIAGNOSIS — E1143 Type 2 diabetes mellitus with diabetic autonomic (poly)neuropathy: Secondary | ICD-10-CM | POA: Diagnosis not present

## 2018-01-11 DIAGNOSIS — Z833 Family history of diabetes mellitus: Secondary | ICD-10-CM | POA: Diagnosis not present

## 2018-01-11 DIAGNOSIS — Z7984 Long term (current) use of oral hypoglycemic drugs: Secondary | ICD-10-CM | POA: Diagnosis not present

## 2018-01-11 DIAGNOSIS — Z6841 Body Mass Index (BMI) 40.0 and over, adult: Secondary | ICD-10-CM | POA: Diagnosis not present

## 2018-01-11 DIAGNOSIS — B20 Human immunodeficiency virus [HIV] disease: Secondary | ICD-10-CM | POA: Diagnosis not present

## 2018-01-11 DIAGNOSIS — E1142 Type 2 diabetes mellitus with diabetic polyneuropathy: Secondary | ICD-10-CM | POA: Diagnosis not present

## 2018-01-16 ENCOUNTER — Other Ambulatory Visit: Payer: Self-pay | Admitting: Internal Medicine

## 2018-01-16 ENCOUNTER — Other Ambulatory Visit: Payer: Self-pay | Admitting: Infectious Disease

## 2018-01-16 DIAGNOSIS — I10 Essential (primary) hypertension: Secondary | ICD-10-CM

## 2018-02-28 ENCOUNTER — Telehealth: Payer: Self-pay | Admitting: Pharmacist

## 2018-02-28 DIAGNOSIS — E118 Type 2 diabetes mellitus with unspecified complications: Secondary | ICD-10-CM

## 2018-02-28 MED ORDER — PRAVASTATIN SODIUM 40 MG PO TABS: 40.0000 mg | ORAL_TABLET | Freq: Every day | ORAL | 0 refills | Status: DC

## 2018-02-28 NOTE — Progress Notes (Signed)
Contacted patient to initiate statin therapy per PCP approval.  Patient has DM, 10-year CV event risk estimate is 6.9%. LDL in June 2019 was 172. Patient has a history of trying simvastatin, rosuvastatin, and atorvastatin. These were switched due to either intolerance or drug interactions. Patient agreed to try pravastatin. Prescription sent for pravastatin 40 mg. If tolerated, can increase to 80 mg based on lipid panel and considering possible interaction with Symtuza which may reduce pravastatin efficacy. If needed in the future, ezetimibe and/or PCSK-9 inhibitor are additional options.  Advised patient to schedule PCP follow up appointment and sent note to front desk.

## 2018-03-05 ENCOUNTER — Other Ambulatory Visit: Payer: Self-pay | Admitting: Neurology

## 2018-03-07 ENCOUNTER — Other Ambulatory Visit: Payer: Self-pay | Admitting: Internal Medicine

## 2018-03-07 ENCOUNTER — Telehealth: Payer: Self-pay | Admitting: Neurology

## 2018-03-07 ENCOUNTER — Other Ambulatory Visit: Payer: Self-pay | Admitting: *Deleted

## 2018-03-07 ENCOUNTER — Other Ambulatory Visit: Payer: Self-pay | Admitting: Obstetrics and Gynecology

## 2018-03-07 DIAGNOSIS — G4700 Insomnia, unspecified: Secondary | ICD-10-CM

## 2018-03-07 MED ORDER — ZOLMITRIPTAN 5 MG PO TABS: ORAL_TABLET | ORAL | 11 refills | Status: DC

## 2018-03-07 NOTE — Telephone Encounter (Signed)
.  Patient says she is calling regarding refill for zolmitriptan (ZOMIG) 5 MG tablet. It takes several weeks because it will need a PA. She is not out of medication now.

## 2018-03-07 NOTE — Telephone Encounter (Signed)
Next appt scheduled  10/7 with PCP.

## 2018-03-07 NOTE — Telephone Encounter (Signed)
Spoke to patient - she is aware that her PA for Zomig is valid through 09/29/2018.

## 2018-03-26 IMAGING — US US EXTREM LOW VENOUS*L*
1 series · 13 of 24 positions shown · non-contrast
Comparison: left femur series [DATE]

CLINICAL DATA: 49-year-old female with pain in the left popliteal
fossa and calf for 2 weeks. Personal history of non-Hodgkin's
lymphoma. Initial encounter.



[Series 1: us extrem low venous*left* · 0.08mm/px · 13 of 24 slices shown]
[im 1/24]
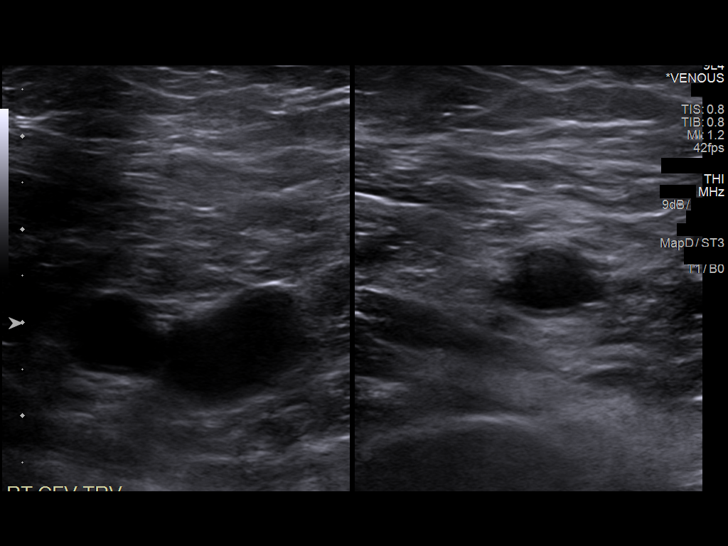
[im 3/24]
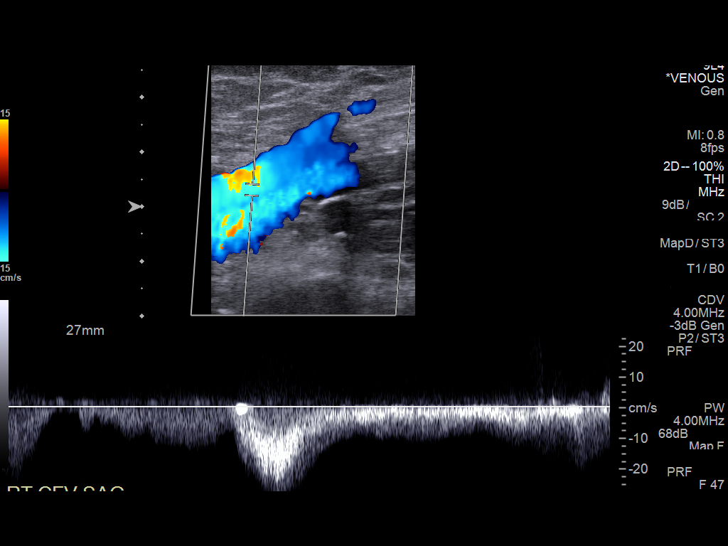
[im 5/24]
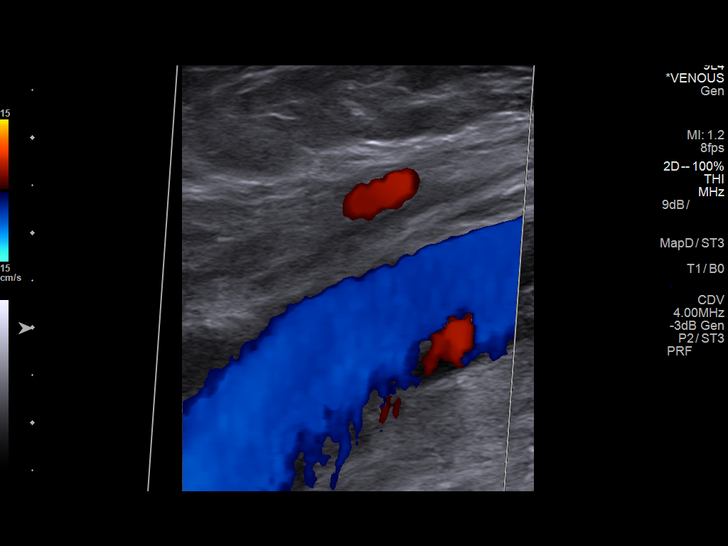
[im 7/24]
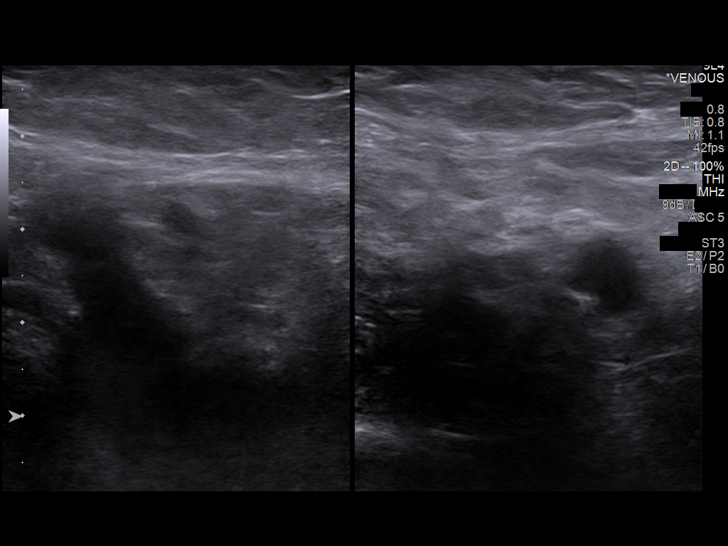
[im 9/24]
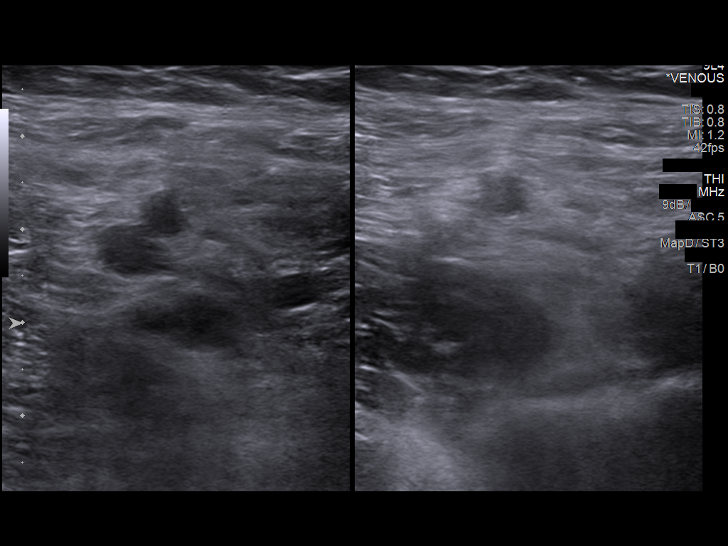
[im 11/24]
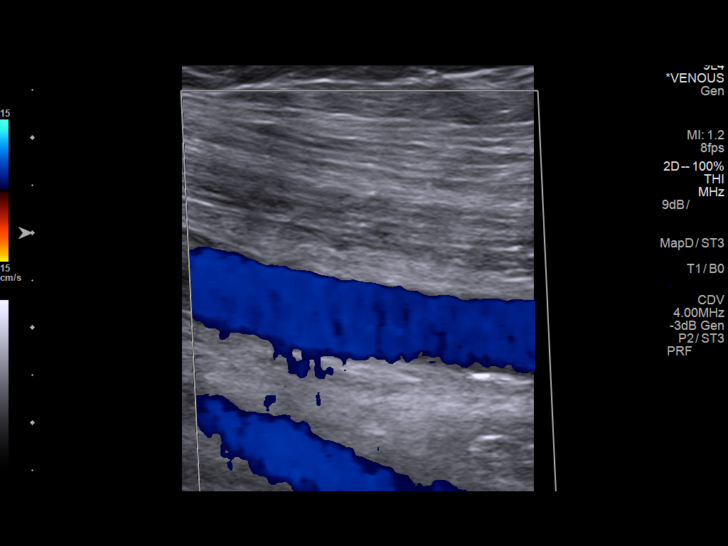
[im 13/24]
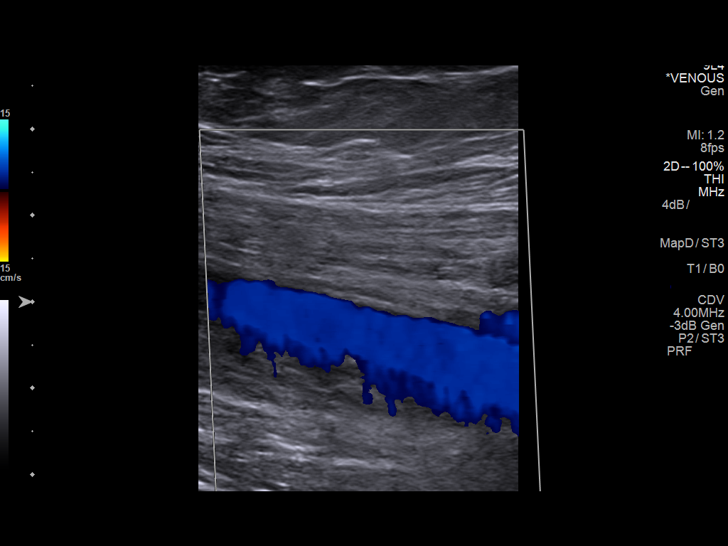
[im 14/24]
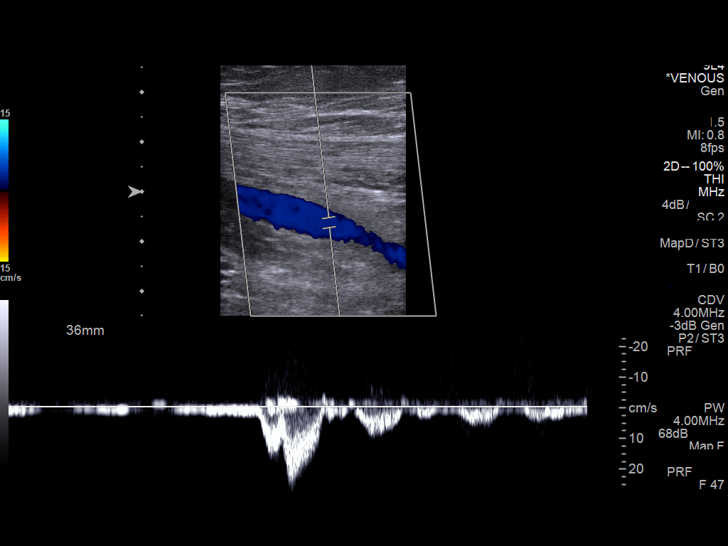
[im 16/24]
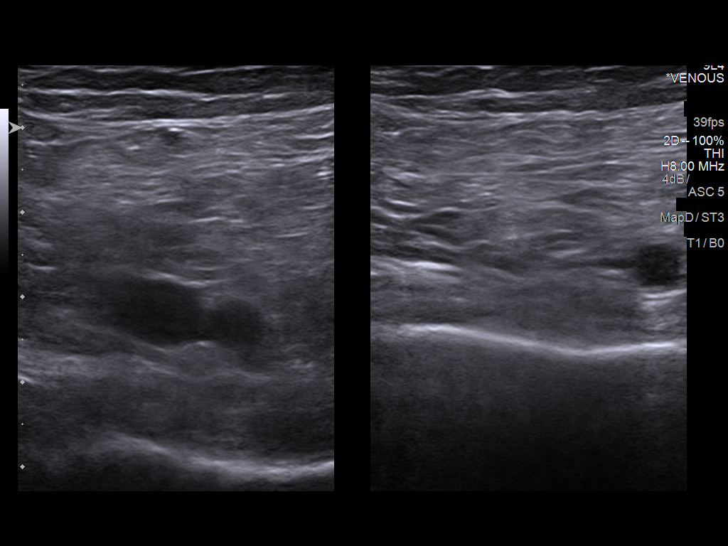
[im 18/24]
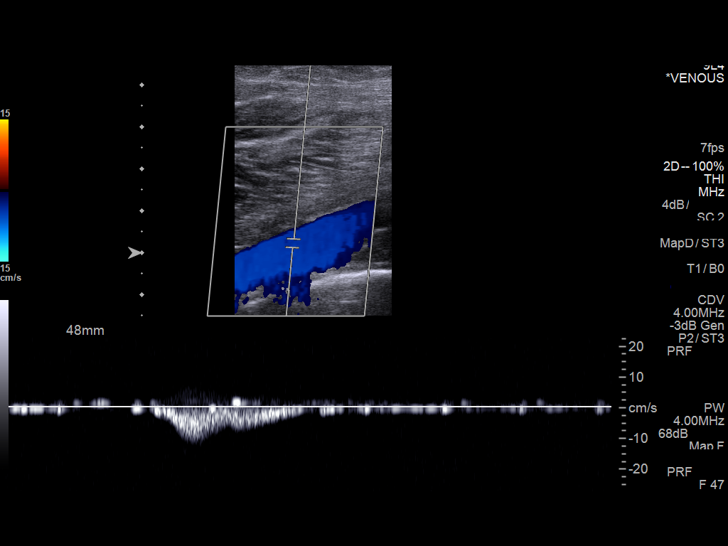
[im 20/24]
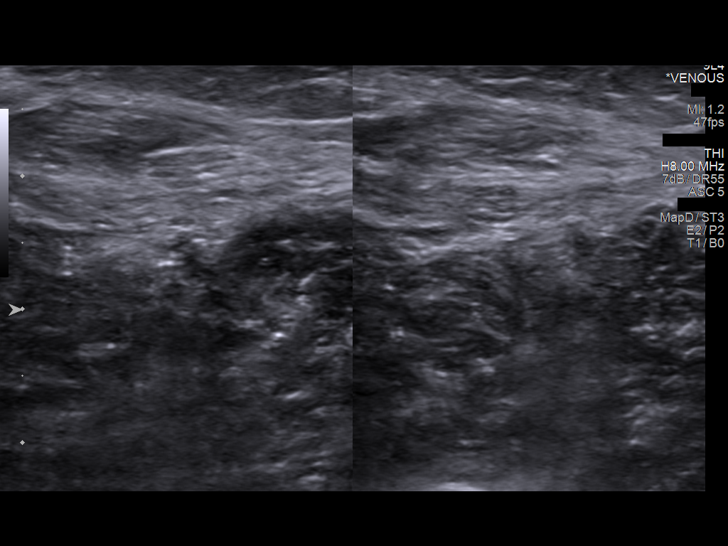
[im 22/24]
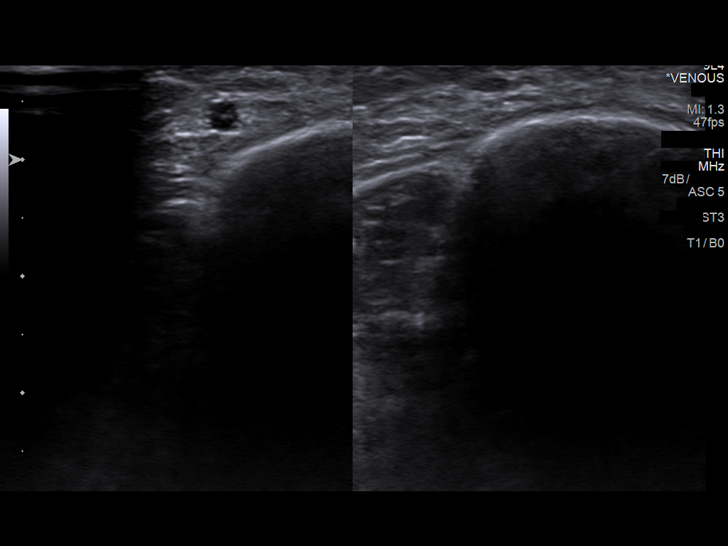
[im 24/24]
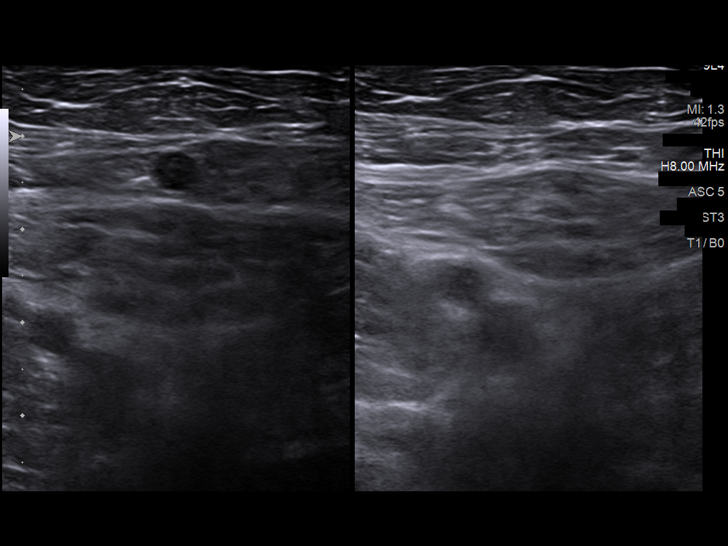

[13 of 24 positions shown; findings below may reference images not displayed]

FINDINGS: Contralateral Common Femoral Vein: Respiratory phasicity is normal
and symmetric with the symptomatic side. No evidence of thrombus.
Normal compressibility.

Common Femoral Vein: No evidence of thrombus. Normal
compressibility, respiratory phasicity and response to augmentation.

Saphenofemoral Junction: No evidence of thrombus. Normal
compressibility and flow on color Doppler imaging.

Profunda Femoral Vein: No evidence of thrombus. Normal
compressibility and flow on color Doppler imaging.

Femoral Vein: No evidence of thrombus. Normal compressibility,
respiratory phasicity and response to augmentation.

Popliteal Vein: No evidence of thrombus. Normal compressibility,
respiratory phasicity and response to augmentation.

Calf Veins: No evidence of thrombus. Normal compressibility and flow
on color Doppler imaging.

Superficial Great Saphenous Vein: No evidence of thrombus. Normal
compressibility and flow on color Doppler imaging.

Venous Reflux:  None.

Other Findings:  None.
IMPRESSION: No evidence of left lower extremity deep venous thrombosis.

## 2018-03-28 ENCOUNTER — Encounter: Payer: Self-pay | Admitting: Neurology

## 2018-03-28 ENCOUNTER — Ambulatory Visit: Payer: Medicaid Other | Admitting: Neurology

## 2018-03-28 ENCOUNTER — Other Ambulatory Visit: Payer: Self-pay | Admitting: Internal Medicine

## 2018-03-28 VITALS — Ht 66.0 in | Wt 280.5 lb

## 2018-03-28 DIAGNOSIS — E118 Type 2 diabetes mellitus with unspecified complications: Secondary | ICD-10-CM

## 2018-03-28 DIAGNOSIS — G43719 Chronic migraine without aura, intractable, without status migrainosus: Secondary | ICD-10-CM

## 2018-03-28 DIAGNOSIS — G43701 Chronic migraine without aura, not intractable, with status migrainosus: Secondary | ICD-10-CM | POA: Diagnosis not present

## 2018-03-28 NOTE — Progress Notes (Signed)
**  Botox 100 units x 2 vials, NDC 0141-5973-31, Lot [id number], Exp 09/2020, specialty pharmacy.//mck,rn**

## 2018-03-28 NOTE — Progress Notes (Signed)
PATIENT: [name] DOB: Aug 01, 1965  Chief Complaint  Patient presents with  . Migraine    Botox 100 units x 2 vials - specialty pharmacy     HISTORICAL  [name], [name] accompanied by her sister returned for Botox injection as migraine prevention  She has past medical history of HIV, hyperlipidemia, migraines, depression, and non-Hodgkin lymphoma in 2005 with chemotherapy and radiation.   She has history of migraines since 19. In the last 10 years, she reports having daily migraines. She [name] currently taking Topamax and Zomig with relief. She takes approximately 20-25 tablets of her Zomig per month.    She has aura's that look like floating dirt particles, flashing lights, and she sees Christmas tree lights when they are severe, she has nausea, vomiting and sometimes diarrhea when they're severe. She also complains of diplopia, blurry vision, and  photophobia. She also has had a lightning bolt sensation throughout her body.  She describes them as pulsating and throbbing  on a scale of 5-6/10 which she considers mild, when they are severe they can go as high as 10/10. They usually start in the right temporal area, but will also start on the left temporal and are worse.   Any type of smoke or MSG can precipitate a migraine. They generally last 2-9 days. She has been to the emergency department twice in the last year for migraines. She states "I just can't do this anymore I need to do something about her migraines"  She has tried and failed different preventive medications:  atenolol, verapamil, nortriptyline, Inderal, Depakote without benefit. Atenolol and verapamil caused hypotension. Depakote caused severe vomiting. Topamax causing her worsening headaches, will show tried Zonegran, Topamax ER, she denies significant improvement.  For abortive treatment, she has tried Imitrex, Maxalt with suboptimal response, Zomig as needed since to work best,  she also takes transderm scophalamine  patch for nause  I started to give her BOTOX injection since May 2013, every 3 months, she responded very well,   Previous MRI brain was normal in 2013.  Last Botox injection as migraine prevention was March 2017, she responded very well, but in recent few weeks, she noticed increased migraine headaches, this baby had headache left retro-orbital area severe pounding headache with associated light noise sensitivity, has been ongoing for 3 days, failed to respond to Zomig by mouth, Imitrex injection as needed, she has constant dry heaves, difficulty opening her eyes, In addition, today she complains of restless leg symptoms, difficult to stand or lying still for extended period of time, has tried Neurontin-cause nausea, confusion,  Lyrica-cause excessive weight gain, she has never tried dopamine agonist in the past  I reviewed laboratory in April 2017, normal hemoglobin 12 point 9, mild elevated RDW 15.8, decreased MCV   Update April 27 2016  Last Botox injection was in June 2017, she had left knee arthroscopic surgery in August 2017, complaining of significant joints pain, unsteady gait, before Botox she was having moderate to severe headache daily, even Botox now, she used to 12 tablets of Zomig every months because of frequent moderate to severe headache  Update August 10 2016: Last Botox injection was in October 2017, has been very helpful, but over the past 3-4 weeks, she had frequent almost daily headaches, oftentimes wake her up from sleep, Zomig has been very helpful,  Low dose Mirapex 0.25 milligrams 2 tablets every night has been very helpful for her restless leg symptoms, she reported 80% improvement, laboratory  reviewed in January 2018, CD4 count 760, normal hemoglobin 12.3, mildly decreased MCV, increased RDW,  Update Nov 09 2016: She suffered bilateral ear infection, strep throat since last visit, had frequent headaches, nausea,  Update February 22 2017: She responded well to  previous injection, complains of a lot of stress at home  UPDATE May 31 2017: She complains suboptimal response to Botox injection, had daily headache over last week, also complains of a lot of stress at home, average more than 4 headaches in 1 week, lasting all day, with associated nausea vomiting, [name] taking Zomig as needed, with partial response  UPDATE September 08 2017: She complains of excessive stress, frequent migraine headaches,  UPDATE December 20 2017: She [name] accompanied by her sister at today's clinical visit, Ajovy injection has helped her migraine, she rated as 25%, but with the combination of Ajovy and Botox injection, she still has 3 migraine headaches each week, has been dealing with daily headaches over the past 1 week, required multiple dose of Zomig treatment  UPDATE Sept 25 2019: She responded well to previous injection, today she complains of 2 weeks history of severe headaches failed home remedy treatment, multiple dose of Zomig,  She [name] also getting Ajovy injection every month, which has been helpful,  REVIEW OF SYSTEMS: Full 14 system review of systems performed and notable only for as above  ALLERGIES: Allergies  Allergen Reactions  . Diazepam     Triggers migraines  . Divalproex Sodium Nausea And Vomiting    Causes light-headedness  . Gabapentin Nausea And Vomiting    confusion  . Ondansetron Hcl Nausea And Vomiting    Triggers migraines, "makes me vomit"  . Penicillins     REACTION: lips swollen to point of bleeding, sever vaginal irritation  Reports tolerating augmentin well without any complaints or reaction  . Pineapple   . Propoxyphene N-Acetaminophen Nausea And Vomiting  . Simvastatin     PATIENT CANNOT BE PRESCRIBED THIS STATIN WHILE RECEIVING NORVIR  . Topamax [Topiramate]     Migraines   . Tramadol Hcl Itching    REACTION: migraines and itching--hx of migraines even without medication   . Zocor [Simvastatin - High Dose]     HOME  MEDICATIONS: Current Outpatient Medications  Medication Sig Dispense Refill  . Beclomethasone Dipropionate 80 MCG/ACT AERS Place 2 Inhalers into the nose daily. 8.7 g 10  . blood glucose meter kit and supplies Dispense based on patient and insurance preference. Use up to four times daily as directed. (FOR ICD-9 250.00, 250.01). 1 each 0  . BOTOX 100 units SOLR injection MD [name] TO INJECT 200 UNITS INTRAMUSCULARLY INTO HEAD AND NECK MUSCLES EVERY 3 MONTHS 2 each 3  . Calcium Carbonate-Vitamin D (CALCIUM-VITAMIN D) 500-200 MG-UNIT per tablet Take 2 tablets by mouth daily.     . dapagliflozin propanediol (FARXIGA) 10 MG TABS tablet Take 10 mg by mouth daily. 90 tablet 1  . Darunavir-Cobicisctat-Emtricitabine-Tenofovir Alafenamide (SYMTUZA) 800-150-200-10 MG TABS Take 1 tablet by mouth daily with breakfast. 30 tablet 11  . diclofenac sodium (VOLTAREN) 1 % GEL APPLY 4 GRAMS TOPICALLY 4 TIMES DAILY 100 g 5  . DULERA 100-5 MCG/ACT AERO USE 2 PUFFS BY MOUTH THE FIRST THING IN THE MORNING AND THEN ANOTHER 2 PUFFS ABOUT 12 HOURS LATER 13 g 5  . fluconazole (DIFLUCAN) 100 MG tablet TAKE 1 TABLET BY MOUTH EVERY DAY 14 tablet 3  . fluocinonide (LIDEX) 0.05 % external solution 1 gtt AU qD prn itching    .  fluticasone (FLONASE) 50 MCG/ACT nasal spray USE 2 SPRAYS IN BOTH NOSTRILS EVERY DAY 16 g 0  . Fremanezumab-vfrm (AJOVY) 225 MG/1.5ML SOSY Inject 225 mg into the skin every 30 (thirty) days. 1 Syringe 11  . glucose blood test strip Use as instructed 100 each 12  . hydrOXYzine (ATARAX/VISTARIL) 25 MG tablet Take 1 tablet (25 mg total) by mouth at bedtime. 30 tablet 2  . lisinopril (PRINIVIL,ZESTRIL) 10 MG tablet TAKE 1 TABLET BY MOUTH EVERY DAY FOR BLOOD PRESSURE 90 tablet 2  . pantoprazole (PROTONIX) 40 MG tablet Take 1 tablet (40 mg total) by mouth 2 (two) times daily. 60 tablet 4  . pramipexole (MIRAPEX) 0.25 MG tablet Take 3 tablets (0.75 mg total) by mouth at bedtime. 90 tablet 11  . pravastatin (PRAVACHOL)  40 MG tablet Take 1 tablet (40 mg total) by mouth daily. 30 tablet 0  . PROAIR HFA 108 (90 Base) MCG/ACT inhaler USE 1 TO 2 PUFFS EVERY 4 TO 6 HOURS AS NEEDED SHORTNESS OF BREATH OR WHEEZING 8.5 g 1  . prochlorperazine (COMPAZINE) 10 MG tablet TAKE 1 TABLET BY MOUTH EVERY 6 HOURS AS NEEDED NAUSEA OR VOMITING 30 tablet 11  . sitaGLIPtin (JANUVIA) 100 MG tablet Take 1 tablet (100 mg total) by mouth daily. 90 tablet 1  . Tamsulosin HCl (FLOMAX) 0.4 MG CAPS Take 0.4 mg by mouth daily. 1-2 daily    . terconazole (TERAZOL 7) 0.4 % vaginal cream Place 1 applicator vaginally at bedtime. 45 g 0  . tiZANidine (ZANAFLEX) 4 MG tablet TAKE 1 TABLET BY MOUTH 2 TIMES A DAY AS NEEDED FOR MUSCLE SPASMS 60 tablet 11  . TRANSDERM-SCOP, 1.5 MG, 1 MG/3DAYS PLACE 1 PATCH ON SKIN EVERY 3 DAYS 10 patch 11  . zolmitriptan (ZOMIG) 5 MG tablet Take 1 tab at onset of migraine.  May repeat, with 1 tab, in 2 hrs, if needed.  Max dose of 2 tabs/24hrs.  Must last 30 days. #12/30 12 tablet 11  . zolpidem (AMBIEN) 10 MG tablet TAKE 1 TABLET BY MOUTH EVERY NIGHT AT BEDTIME AS NEEDED FOR SLEEP 30 tablet 2   No current facility-administered medications for this visit.     PAST MEDICAL HISTORY: Past Medical History:  Diagnosis Date  . Acute gastritis without mention of hemorrhage 01/01/2002  . Anemia 10/2009   macrocytic anemia with baseline MCV 104-106  . Anxiety   . Asthma   . Boil 01/05/2017  . 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 Complex tear of medial meniscus of left knee as current injury 02/25/2016  . Complication of anesthesia    hard to wake up  . COPD (chronic obstructive pulmonary disease) (HCC)    chronic bronchitis  . Derangement of anterior horn of lateral meniscus of left knee 02/25/2016  . Duodenitis without mention of hemorrhage 01/01/2002  . DVT (deep venous thrombosis) (HCC)    history clot lt groin when she had lymphoma-in remision now.  . Elevated triglycerides with high cholesterol 01/05/2017  .  Gastroparesis   . GERD (gastroesophageal reflux disease)   . Hiatal hernia 01/01/2002  . HIV infection ([place])    CD4 = 570 (05/2010), VL undetectation  . Hyperlipidemia   . IBS (irritable bowel syndrome)   . Migraines    on topamax and triptans, frequent (almost daily) attacks   . Non Hodgkin's lymphoma ([place])    stage II, s/p resection, chemotherapy, radiotherapy, Dr. Beryle Beams [name] her oncologist.   . PUD (peptic ulcer disease) 07/1997  per EGD report 07/1997 with history of esophagitis  . Restless leg syndrome 12/24/2015  . Shingles    in lumbar dermatome  . Spondylolisthesis of lumbar region    L5-S1  . TMJ (dislocation of temporomandibular joint)   . Traumatic tear of lateral meniscus of right knee 04/20/2012  . Vaginal yeast infection 09/25/2017    PAST SURGICAL HISTORY: Past Surgical History:  Procedure Laterality Date  . CHOLECYSTECTOMY    . ENDOMETRIAL ABLATION    . KNEE ARTHROSCOPY Right 04/20/12  . KNEE ARTHROSCOPY WITH LATERAL MENISECTOMY Left 02/25/2016   Procedure: LEFT KNEE ARTHROSCOPY WITH MEDIAL AND  LATERAL MENISECTOMIES;  Surgeon: Marchia Bond, MD;  Location: [facility];  Service: Orthopedics;  Laterality: Left;  . KNEE ARTHROSCOPY WITH MEDIAL MENISECTOMY Left 02/25/2016   Procedure: KNEE ARTHROSCOPY WITH MEDIAL MENISECTOMY;  Surgeon: Marchia Bond, MD;  Location: [facility];  Service: Orthopedics;  Laterality: Left;  . TONSILLECTOMY    . TYMPANOPLASTY Left   . TYMPANOSTOMY TUBE PLACEMENT     as child-both  . UPPER GASTROINTESTINAL ENDOSCOPY  2003, 2007   done by Dr. Velora Heckler    FAMILY HISTORY: Family History  Problem Relation Age of Onset  . Hypertension Mother   . Stroke Mother   . Heart disease Mother   . Diabetes Mother   . Breast cancer Paternal Grandmother   . Kidney cancer Maternal Uncle   . Heart disease Maternal Uncle   . Diabetes Maternal Uncle   . Diabetes Maternal Grandmother   . Heart disease Maternal  Grandmother   . Emphysema Paternal Uncle        never smoker  . Emphysema Paternal Grandfather        never smoker  . Colon cancer Paternal Aunt        with met to lung   . Non-Hodgkin's lymphoma Maternal Uncle   . Heart disease Maternal Grandfather     SOCIAL HISTORY:  Social History   Socioeconomic History  . Marital status: Divorced    Spouse name: Not on file  . Number of children: 0  . Years of education: 69  . Highest education level: Not on file  Occupational History  . Occupation: disabled    Fish farm manager: UNEMPLOYED  Social Needs  . Financial resource strain: Not on file  . Food insecurity:    Worry: Not on file    Inability: Not on file  . Transportation needs:    Medical: Not on file    Non-medical: Not on file  Tobacco Use  . Smoking status: Never Smoker  . Smokeless tobacco: Never Used  Substance and Sexual Activity  . Alcohol use: No    Alcohol/week: 0.0 standard drinks  . Drug use: No  . Sexual activity: Not Currently    Partners: Male    Birth control/protection: Surgical  Lifestyle  . Physical activity:    Days per week: Not on file    Minutes per session: Not on file  . Stress: Not on file  Relationships  . Social connections:    Talks on phone: Not on file    Gets together: Not on file    Attends religious service: Not on file    Active member of club or organization: Not on file    Attends meetings of clubs or organizations: Not on file    Relationship status: Not on file  . Intimate partner violence:    Fear of current or ex partner: Not on file  Emotionally abused: Not on file    Physically abused: Not on file    Forced sexual activity: Not on file  Other Topics Concern  . Not on file  Social History Narrative      Patient [name] disabled.   Right handed.   Caffeine - Diet soda., tea   Education - high school     PHYSICAL EXAM   Vitals:   03/28/18 1405  Weight: 280 lb 8 oz (127.2 kg)  Height: '5\' 6"'$  (1.676 m)    Not recorded       Body mass index [name] 45.27 kg/m.  PHYSICAL EXAMNIATION:  Gen: NAD, conversant, well nourised, obese, well groomed                     Cardiovascular: Regular rate rhythm, no peripheral edema, warm, nontender. Eyes: Conjunctivae clear without exudates or hemorrhage Neck: Supple, no carotid bruise. Pulmonary: Clear to auscultation bilaterally   NEUROLOGICAL EXAM:  MENTAL STATUS: Speech:    Speech [name] normal; fluent and spontaneous with normal comprehension.  Cognition:     Orientation to time, place and person     Normal recent and remote memory     Normal Attention span and concentration     Normal Language, naming, repeating,spontaneous speech     Fund of knowledge   CRANIAL NERVES: CN II: Visual fields are full to confrontation. Fundoscopic exam [name] normal with sharp discs and no vascular changes. Pupils are round equal and briskly reactive to light. CN III, IV, VI: extraocular movement are normal. No ptosis. CN V: Facial sensation [name] intact to pinprick in all 3 divisions bilaterally. Corneal responses are intact.  CN VII: Face [name] symmetric with normal eye closure and smile. CN VIII: Hearing [name] normal to rubbing fingers CN IX, X: Palate elevates symmetrically. Phonation [name] normal. CN XI: Head turning and shoulder shrug are intact CN XII: Tongue [name] midline with normal movements and no atrophy.  MOTOR: There [name] no pronator drift of out-stretched arms. Muscle bulk and tone are normal. Muscle strength [name] normal.  REFLEXES: Reflexes are 2+ and symmetric at the biceps, triceps, knees, and ankles. Plantar responses are flexor.  SENSORY: Intact to light touch, pinprick, positional sensation and vibratory sensation are intact in fingers and toes.  COORDINATION: Rapid alternating movements and fine finger movements are intact. There [name] no dysmetria on finger-to-nose and heel-knee-shin.    GAIT/STANCE: Posture [name] normal. Gait [name] steady with normal steps, base, arm swing, and  turning. Heel and toe walking are normal. Tandem gait [name] normal.  Romberg [name] absent.  DIAGNOSTIC DATA (LABS, IMAGING, TESTING) - I reviewed patient records, labs, notes, testing and imaging myself where available.   ASSESSMENT AND PLAN  TALER KUSHNER [name] a 52 y.o. female    History of HIV, hypercholesterolemia, Non hodgkin' lymphoma with chemotherapy and radiation and migraines since age 2.  Chronic migraine headaches She has tried and failed multiple preventive medications including atenolol, verapamil, nortriptyline, and Depakote,Topamax, Topamax ER, could not tolerate Cardizem,  She has migraines daily for more than  10 years. She [name] currently taking  zonogram  and Zomig with some relief. Previously tried imitrex and Maxalt with suboptimal response,  Normal MRI brain, and neurological exam.  BOTOX injection was performed according to protocol by [employer]. 100 units of BOTOX was dissolved into 2 cc NS  Used total 200 units  BOTOX injection was performed according to protocol by [facility]. 100 units of BOTOX was dissolved into  2 cc NS.     I have skipped bilateral corrugate and Procerues injection today to avoid the long-term side effect Extra 25 units was injected into bilateral temporal parietal region 10 units were injected into bilateral masseters  Patient tolerate the injection well.  continue AJOVY monthly injection    Restless leg symptoms   Continue Mirapex 0.2 5 mg,  3 tablets every night  Ferritin 51 in Feb 2018,     Marcial Pacas, M.D. Ph.D.  [facility] [address] [place], [place] [address] Ph: [phone] Fax: [phone]  CC: Referring Provider

## 2018-03-29 NOTE — Telephone Encounter (Signed)
Next appt scheduled 10/7 with PCP.

## 2018-04-03 ENCOUNTER — Other Ambulatory Visit: Payer: Self-pay | Admitting: Internal Medicine

## 2018-04-03 DIAGNOSIS — G2581 Restless legs syndrome: Secondary | ICD-10-CM

## 2018-04-09 ENCOUNTER — Encounter: Payer: Self-pay | Admitting: Internal Medicine

## 2018-04-09 ENCOUNTER — Ambulatory Visit: Payer: Medicaid Other | Admitting: Internal Medicine

## 2018-04-09 VITALS — BP 168/84 | HR 102 | Temp 98.0°F | Ht 66.0 in

## 2018-04-09 DIAGNOSIS — Z79899 Other long term (current) drug therapy: Secondary | ICD-10-CM | POA: Diagnosis not present

## 2018-04-09 DIAGNOSIS — M23204 Derangement of unspecified medial meniscus due to old tear or injury, left knee: Secondary | ICD-10-CM | POA: Diagnosis not present

## 2018-04-09 DIAGNOSIS — K5900 Constipation, unspecified: Secondary | ICD-10-CM

## 2018-04-09 DIAGNOSIS — I10 Essential (primary) hypertension: Secondary | ICD-10-CM | POA: Diagnosis not present

## 2018-04-09 DIAGNOSIS — Z7984 Long term (current) use of oral hypoglycemic drugs: Secondary | ICD-10-CM | POA: Diagnosis not present

## 2018-04-09 DIAGNOSIS — S83232S Complex tear of medial meniscus, current injury, left knee, sequela: Secondary | ICD-10-CM

## 2018-04-09 DIAGNOSIS — G8929 Other chronic pain: Secondary | ICD-10-CM

## 2018-04-09 DIAGNOSIS — E119 Type 2 diabetes mellitus without complications: Secondary | ICD-10-CM | POA: Diagnosis not present

## 2018-04-09 DIAGNOSIS — G4700 Insomnia, unspecified: Secondary | ICD-10-CM | POA: Diagnosis not present

## 2018-04-09 DIAGNOSIS — K219 Gastro-esophageal reflux disease without esophagitis: Secondary | ICD-10-CM

## 2018-04-09 DIAGNOSIS — Z23 Encounter for immunization: Secondary | ICD-10-CM | POA: Diagnosis not present

## 2018-04-09 LAB — GLUCOSE, CAPILLARY: GLUCOSE-CAPILLARY: 136 mg/dL — AB (ref 70–99)

## 2018-04-09 LAB — POCT GLYCOSYLATED HEMOGLOBIN (HGB A1C): Hemoglobin A1C: 6.9 % — AB (ref 4.0–5.6)

## 2018-04-09 MED ORDER — DICLOFENAC SODIUM 1 % TD GEL: TRANSDERMAL | 11 refills | Status: DC

## 2018-04-09 MED ORDER — LUBIPROSTONE 24 MCG PO CAPS: 24.0000 ug | ORAL_CAPSULE | Freq: Two times a day (BID) | ORAL | 1 refills | Status: DC

## 2018-04-09 MED ORDER — ZOLPIDEM TARTRATE ER 12.5 MG PO TBCR: 12.5000 mg | EXTENDED_RELEASE_TABLET | Freq: Every evening | ORAL | 2 refills | Status: DC | PRN

## 2018-04-09 NOTE — Progress Notes (Signed)
   CC: F/U Constipation and Insomnia   HPI:  [name] is a 52 y.o. female who presented to the clinic for continued evaluation and management of her chronic medical illnesses. For a detailed assessment and plan please refer to problem based charting below.   Past Medical History:  Diagnosis Date  . Acute gastritis without mention of hemorrhage 01/01/2002  . Anemia 10/2009   macrocytic anemia with baseline MCV 104-106  . Anxiety   . Asthma   . Boil 01/05/2017  . 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 Complex tear of medial meniscus of left knee as current injury 02/25/2016  . Complication of anesthesia    hard to wake up  . COPD (chronic obstructive pulmonary disease) (HCC)    chronic bronchitis  . Derangement of anterior horn of lateral meniscus of left knee 02/25/2016  . Duodenitis without mention of hemorrhage 01/01/2002  . DVT (deep venous thrombosis) (HCC)    history clot lt groin when she had lymphoma-in remision now.  . Elevated triglycerides with high cholesterol 01/05/2017  . Gastroparesis   . GERD (gastroesophageal reflux disease)   . Hiatal hernia 01/01/2002  . HIV infection ([place])    CD4 = 570 (05/2010), VL undetectation  . Hyperlipidemia   . IBS (irritable bowel syndrome)   . Migraines    on topamax and triptans, frequent (almost daily) attacks   . Non Hodgkin's lymphoma ([place])    stage II, s/p resection, chemotherapy, radiotherapy, Dr. Beryle Beams is her oncologist.   . PUD (peptic ulcer disease) 07/1997   per EGD report 07/1997 with history of esophagitis  . Restless leg syndrome 12/24/2015  . Shingles    in lumbar dermatome  . Spondylolisthesis of lumbar region    L5-S1  . TMJ (dislocation of temporomandibular joint)   . Traumatic tear of lateral meniscus of right knee 04/20/2012  . Vaginal yeast infection 09/25/2017   Review of Systems: 12 point ROS preformed. All negative aside from those mentioned in the HPI.  Physical Exam: Vitals:   04/09/18 1502    BP: (!) 168/84  Pulse: (!) 102  Temp: 98 F (36.7 C)  TempSrc: Oral  SpO2: 100%  Height: 5\' 6"  (1.676 m)   General: Obese female in no acute distress Pulm: Good air movement with no wheezing or crackles  CV: RRR, no murmurs, no rubs  Abdomen: Soft, non-distended, no tenderness to palpation  Extremities: No LE edema   Assessment & Plan:   See Encounters Tab for problem based charting.  Patient discussed with Dr. Dareen Piano

## 2018-04-09 NOTE — Patient Instructions (Signed)
Thank you for allowing [place] to provide your care. Please come back to see [place] in 2 months.

## 2018-04-11 ENCOUNTER — Other Ambulatory Visit: Payer: Self-pay

## 2018-04-11 NOTE — Assessment & Plan Note (Signed)
Patient now with controlled DM. Her A1c is now 6.9. She is on [place]. She is working on weight loss and exercise. She will follow-up with endocrinology in November. No changes to medical management today.

## 2018-04-11 NOTE — Assessment & Plan Note (Signed)
Continues to have chronic pain 2/2 medial meniscus tear. Voltaren gel significantly helps the pain. Prescriptions sent.

## 2018-04-11 NOTE — Telephone Encounter (Signed)
Requesting PA on zolpidem (AMBIEN CR) 12.5 MG CR tablet. Needs to speak with a nurse.

## 2018-04-11 NOTE — Assessment & Plan Note (Signed)
Patient struggles with constipation. Tells me she only has 1 BM per month. Has tried multiple medications and lifestyle changes. Last visit we tried Plecanatide 3 mg QD which made her bloated and gave her abdominal pain. She previously followed at [facility]. She want to continue to try different medications. We discussed that a lot of the new medications for idiopathic constipation are not well studied. She voices understanding. We will try Lubiprostone and refer her back to GI.

## 2018-04-11 NOTE — Progress Notes (Signed)
Internal Medicine Clinic Attending  Case discussed with Dr. Helberg at the time of the visit.  We reviewed the resident's history and exam and pertinent patient test results.  I agree with the assessment, diagnosis, and plan of care documented in the resident's note.    

## 2018-04-11 NOTE — Assessment & Plan Note (Signed)
Patient's BP is elevated today. Review of records indicates that it is not typically elevated. She is currently on lisinopril 10 mg QD. She feels that it is elevated today due to stress. Will continue to monitor. If remains elevated we will increase her lisinopril.

## 2018-04-11 NOTE — Assessment & Plan Note (Signed)
Patient previously having uncontrolled symptoms of GERD. She was evaluated by Dr. Tommy Medal who increased her Protonix to 40 mg BID. This has significantly improved her symptoms. She is avoid foods that aggravate her GERD and trying to loose weight. Will continue current management.

## 2018-04-11 NOTE — Assessment & Plan Note (Signed)
>>  ASSESSMENT AND PLAN FOR TYPE 2 DIABETES MELLITUS (HCC) WRITTEN ON 04/11/2018  6:33 AM BY HELBERG, JUSTIN, MD  Patient now with controlled DM. Her A1c is now 6.9. She is on [place]. She is working on weight loss and exercise. She will follow-up with endocrinology in November. No changes to medical management today.

## 2018-04-11 NOTE — Assessment & Plan Note (Addendum)
Continues to struggle with insomnia, initiation and maintenance. Switched her Ambien to extended release which should target both. Will continue to work on sleep hygiene.

## 2018-04-12 ENCOUNTER — Telehealth: Payer: Self-pay | Admitting: *Deleted

## 2018-04-12 NOTE — Telephone Encounter (Addendum)
Information sent to [employer] for Zolpidem 12.5.mg # 30.  Awaiting determination for increased amount.  Sander Nephew, RN 04/12/2018 11:57 AM. Call to Sinclair Grooms on 04/03/2018 PA in Review.  To call back on Monday.  Sander Nephew, RN 04/13/2013:15 PM.  RTC to [employer] on Monday unable to locate fax for Zolpiedem request.  Refaxed information to [employer].  Sander Nephew, RN 04/16/2018 4:34 PM.  Call to [facility] spoke with representative. Denial for 12.5 mg tablets of the Zolpidem.  Prescription can be changed to 10 mg tablets and resubmitted for a 30 day supply.  Patient can receive a 15 day supply of the 5 mg or 10 with out a PA.  Patient can also try the preferred Temazepam 15 or 30 mg or Flurazepam Capsules without need for a PA.  Message to be sent to Dr. Tarri Abernethy to consider a change. Sander Nephew, RN 04/19/2018 11:57 AM.

## 2018-04-18 ENCOUNTER — Encounter: Payer: Self-pay | Admitting: *Deleted

## 2018-04-19 ENCOUNTER — Telehealth: Payer: Self-pay | Admitting: *Deleted

## 2018-04-19 NOTE — Telephone Encounter (Signed)
Patient has been on Ambien. Switch was to extended release which should help with both initiation and maintenance of sleep.   Is insurance requiring prior auth because it was switched to the extended release or because of the 90 day supply?   Thank you.

## 2018-04-19 NOTE — Telephone Encounter (Signed)
Call from pt - stated her doctor suppose to be ordering med to help her fall asleep( only med I see on her medlist  is [place]) Stated Ambien helps her to stay asleep.  Talked to [facility] about PA for Ambien CR - see her phone encounter. Thanks

## 2018-04-23 NOTE — Telephone Encounter (Signed)
See 10/10 encounter.

## 2018-05-03 ENCOUNTER — Ambulatory Visit: Payer: Medicaid Other | Admitting: Gastroenterology

## 2018-05-04 ENCOUNTER — Other Ambulatory Visit: Payer: Self-pay | Admitting: Infectious Disease

## 2018-05-04 ENCOUNTER — Other Ambulatory Visit: Payer: Self-pay | Admitting: *Deleted

## 2018-05-04 ENCOUNTER — Telehealth: Payer: Self-pay | Admitting: Neurology

## 2018-05-04 ENCOUNTER — Other Ambulatory Visit: Payer: Self-pay | Admitting: Neurology

## 2018-05-04 DIAGNOSIS — J310 Chronic rhinitis: Secondary | ICD-10-CM

## 2018-05-04 MED ORDER — SCOPOLAMINE 1 MG/3DAYS TD PT72: MEDICATED_PATCH | TRANSDERMAL | 11 refills | Status: DC

## 2018-05-04 MED ORDER — FLUTICASONE PROPIONATE 50 MCG/ACT NA SUSP: NASAL | 0 refills | Status: DC

## 2018-05-04 NOTE — Addendum Note (Signed)
Addended by: Noberto Retort C on: 05/04/2018 01:13 PM   Modules accepted: Orders

## 2018-05-04 NOTE — Telephone Encounter (Addendum)
Ok, per Dr. Krista Blue, to provide refills.  Prescription sent to requested pharmacy.

## 2018-05-04 NOTE — Telephone Encounter (Signed)
Has called for another prescription for her TRANSDERM-SCOP, 1.5 MG, 1 MG/3DAYS [facility]

## 2018-05-10 ENCOUNTER — Ambulatory Visit: Payer: Medicaid Other | Admitting: Sports Medicine

## 2018-05-16 ENCOUNTER — Telehealth: Payer: Self-pay | Admitting: *Deleted

## 2018-05-17 ENCOUNTER — Telehealth: Payer: Self-pay | Admitting: Neurology

## 2018-05-17 NOTE — Telephone Encounter (Signed)
Botox letter regarding [facility] mailed to patient °

## 2018-05-24 ENCOUNTER — Other Ambulatory Visit: Payer: Self-pay | Admitting: Internal Medicine

## 2018-05-24 DIAGNOSIS — G4700 Insomnia, unspecified: Secondary | ICD-10-CM

## 2018-05-24 NOTE — Telephone Encounter (Signed)
Call made to [facility] as pt should have refill on file

## 2018-05-28 NOTE — Telephone Encounter (Signed)
Patient switched to extended release. Thank you.

## 2018-05-29 ENCOUNTER — Other Ambulatory Visit: Payer: Self-pay | Admitting: Internal Medicine

## 2018-05-29 ENCOUNTER — Encounter: Payer: Self-pay | Admitting: Sports Medicine

## 2018-05-29 ENCOUNTER — Ambulatory Visit
Admission: RE | Admit: 2018-05-29 | Discharge: 2018-05-29 | Disposition: A | Discharge status: Home / Self Care | Payer: Medicaid Other | Source: Ambulatory Visit | Attending: Sports Medicine | Admitting: Sports Medicine

## 2018-05-29 ENCOUNTER — Other Ambulatory Visit: Payer: Self-pay

## 2018-05-29 ENCOUNTER — Ambulatory Visit: Payer: Medicaid Other | Admitting: Sports Medicine

## 2018-05-29 VITALS — BP 134/68 | Ht 66.0 in | Wt 278.0 lb

## 2018-05-29 DIAGNOSIS — M25552 Pain in left hip: Secondary | ICD-10-CM | POA: Diagnosis not present

## 2018-05-29 DIAGNOSIS — M4316 Spondylolisthesis, lumbar region: Secondary | ICD-10-CM

## 2018-05-29 DIAGNOSIS — M25551 Pain in right hip: Secondary | ICD-10-CM | POA: Diagnosis not present

## 2018-05-29 DIAGNOSIS — M4317 Spondylolisthesis, lumbosacral region: Secondary | ICD-10-CM | POA: Diagnosis not present

## 2018-05-29 DIAGNOSIS — G4700 Insomnia, unspecified: Secondary | ICD-10-CM

## 2018-05-29 NOTE — Progress Notes (Signed)
CC: Back pain, bilateral hip pain  HPI [name] is a 52 year old female who presents for evaluation of back pain and bilateral hip pain. Patient has a history of known spondylolisthesis with last imaging done in 2013. Patient notes over the last year her back pain has worsened significantly. The pain is located in lower lumbar spine and radiates down the back of her right leg past her knee. She does have numbness and tingling in that leg which is chronic over the last several years. She has no bowel or bladder incontinence and no fevers. Extension makes the pain worse and the pain is unaffected by flexion. She has been using tylenol, ibuprofen, heat, and ice for the pain but this has not helped improve it. She has been unable to do any sort of physical therapy exercises due to a combination of her hip and back pain.  Her hip pain has also been present over the last 1-2 years but has worsened over the last several months. The pain is in her groin with left worse than right. She notes this pain does not radiate anywhere. She has no injury or trauma to her hips. Patient states she is unable to go up or down stairs due to the pain. She has also had several falls the last several weeks due to her hip locking/catching.   See HPI and/or previous note for associated ROS.  Objective: BP 134/68   Ht 5\' 6"  (1.676 m)   Wt 278 lb (126.1 kg)   BMI 44.87 kg/m  Gen: NAD, well groomed, a/o x3, normal affect.  CV: Well-perfused. Warm.  Resp: Non-labored.  Neuro: Sensation intact throughout. No gross coordination deficits.  MSK Lumbar spine:  Inspection: No bruising. No deformity Palpation: No stepoff. Significant tenderness along SI joints bilaterally.  ROM: Limited in extension due to severe pain. Normal flexion, lateral rotation, and lateral bending bilaterally SLUMP: Negative bilaterally Straight leg: Positive on right Reflexes: 2+ patellar reflexes Sensation: Diminished sensation to soft touch on  lateral aspect of right leg and posterior aspect of right leg above the knee  Hips bilaterally Inspection: No deformity Palpation: No tenderness to palpation ROM: Normal ROM with internal rotation, external rotation, flexion, extension, abduction Strength: 4/5 strength with flexion, extension, abduction, internal rotation, external rotation FABER: Negative FADIR: Positive   Assessment and plan: [name] is a 52 year old female who presents for evaluation of back pain and bilateral hip pain.  1. Low back pain -Patient with history of spondylolisthesis. Given history and exam findings, there is concern for worsening of her spondylolisthesis -Will obtain xrays of lumbar spine -Will give patient direction on physical therapy exercises after xrays have been reviewed  2. Bilateral hip pain -Differential includes arthritis vs referred pain from the back -Will obtain AP views of pelvis -Will likely refer patient to physical therapy once xrays have been reviewed  Follow up in 3 weeks  Addendum:  LS and Hip XRays are reviewed.  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  This is likely causing neurogenic pain into both hips and lower legs.  The AP pelvis suggests that the hips do not have OA.  We should get a consult as this may require surgical stabilization.  Consdering her obesity she is a difficult candidate and we may need to also try gabapentin to see how much relieve this would give her.  Challenging case to manage.  Ila Mcgill, MD

## 2018-05-29 NOTE — Telephone Encounter (Signed)
Patient made aware that ambien refill has been sent to [facility]. Confirmed appt for 06/04/2018 at 3:15. Hubbard Hartshorn, RN, BSN

## 2018-05-29 NOTE — Telephone Encounter (Signed)
Pt states the pharmacy is waiting for the clinic to reply on Ambien 10 mg. Pt is using [facility], please call pt back.

## 2018-05-30 ENCOUNTER — Telehealth: Payer: Self-pay | Admitting: Neurology

## 2018-05-30 ENCOUNTER — Encounter: Payer: Self-pay | Admitting: Sports Medicine

## 2018-05-30 NOTE — Telephone Encounter (Signed)
I called the patient to let her know we are aware. She did not answer so I left a VM asking her to call back. If she calls back please let her know we are aware. DW

## 2018-05-30 NOTE — Assessment & Plan Note (Signed)
Difficult case  Based on progression of slip on XR I think it is reasonable to get a consult with a back specialist to see if this requires surgery or a targeted PT program.  Gabapentin is an option for relief of pain.

## 2018-05-30 NOTE — Telephone Encounter (Signed)
Pt called stating she is needing PA for Botox, please advise.

## 2018-06-02 ENCOUNTER — Other Ambulatory Visit: Payer: Self-pay | Admitting: Neurology

## 2018-06-02 ENCOUNTER — Other Ambulatory Visit: Payer: Self-pay | Admitting: Internal Medicine

## 2018-06-02 DIAGNOSIS — J310 Chronic rhinitis: Secondary | ICD-10-CM

## 2018-06-04 ENCOUNTER — Ambulatory Visit: Payer: Medicaid Other | Admitting: Internal Medicine

## 2018-06-04 ENCOUNTER — Other Ambulatory Visit: Payer: Self-pay

## 2018-06-04 ENCOUNTER — Telehealth: Payer: Self-pay | Admitting: *Deleted

## 2018-06-04 ENCOUNTER — Encounter: Payer: Self-pay | Admitting: Internal Medicine

## 2018-06-04 VITALS — BP 138/58 | HR 79 | Temp 98.0°F | Ht 66.0 in | Wt 287.7 lb

## 2018-06-04 DIAGNOSIS — B20 Human immunodeficiency virus [HIV] disease: Secondary | ICD-10-CM

## 2018-06-04 DIAGNOSIS — F339 Major depressive disorder, recurrent, unspecified: Secondary | ICD-10-CM | POA: Diagnosis not present

## 2018-06-04 DIAGNOSIS — F331 Major depressive disorder, recurrent, moderate: Secondary | ICD-10-CM

## 2018-06-04 DIAGNOSIS — Z7984 Long term (current) use of oral hypoglycemic drugs: Secondary | ICD-10-CM

## 2018-06-04 DIAGNOSIS — B3731 Acute candidiasis of vulva and vagina: Secondary | ICD-10-CM

## 2018-06-04 DIAGNOSIS — E119 Type 2 diabetes mellitus without complications: Secondary | ICD-10-CM

## 2018-06-04 DIAGNOSIS — Z8742 Personal history of other diseases of the female genital tract: Secondary | ICD-10-CM | POA: Diagnosis not present

## 2018-06-04 DIAGNOSIS — E1140 Type 2 diabetes mellitus with diabetic neuropathy, unspecified: Secondary | ICD-10-CM

## 2018-06-04 DIAGNOSIS — Z79899 Other long term (current) drug therapy: Secondary | ICD-10-CM

## 2018-06-04 DIAGNOSIS — J3489 Other specified disorders of nose and nasal sinuses: Secondary | ICD-10-CM

## 2018-06-04 DIAGNOSIS — F330 Major depressive disorder, recurrent, mild: Secondary | ICD-10-CM

## 2018-06-04 DIAGNOSIS — M4690 Unspecified inflammatory spondylopathy, site unspecified: Secondary | ICD-10-CM | POA: Diagnosis not present

## 2018-06-04 DIAGNOSIS — M4316 Spondylolisthesis, lumbar region: Secondary | ICD-10-CM | POA: Diagnosis not present

## 2018-06-04 DIAGNOSIS — B373 Candidiasis of vulva and vagina: Secondary | ICD-10-CM

## 2018-06-04 DIAGNOSIS — G4700 Insomnia, unspecified: Secondary | ICD-10-CM | POA: Diagnosis not present

## 2018-06-04 DIAGNOSIS — N898 Other specified noninflammatory disorders of vagina: Secondary | ICD-10-CM

## 2018-06-04 DIAGNOSIS — J302 Other seasonal allergic rhinitis: Secondary | ICD-10-CM

## 2018-06-04 MED ORDER — ALBUTEROL SULFATE HFA 108 (90 BASE) MCG/ACT IN AERS: INHALATION_SPRAY | RESPIRATORY_TRACT | 1 refills | Status: DC

## 2018-06-04 MED ORDER — TERCONAZOLE 0.4 % VA CREA: 1.0000 | TOPICAL_CREAM | Freq: Every day | VAGINAL | 0 refills | Status: DC

## 2018-06-04 MED ORDER — BECLOMETHASONE DIPROPIONATE 80 MCG/ACT NA AERS: 2.0000 | INHALATION_SPRAY | Freq: Every day | NASAL | 10 refills | Status: AC

## 2018-06-04 MED ORDER — AMITRIPTYLINE HCL 25 MG PO TABS: ORAL_TABLET | ORAL | 2 refills | Status: DC

## 2018-06-04 NOTE — Progress Notes (Signed)
ami  CC: F/U insomnia  HPI:  [name] is a 52 y.o. female who presented to the clinic for continued evaluation and management of her chronic medical illnesses. For a detailed assessment and plan please refer to problem based charting below.   Past Medical History:  Diagnosis Date  . Acute gastritis without mention of hemorrhage 01/01/2002  . Anemia 10/2009   macrocytic anemia with baseline MCV 104-106  . Anxiety   . Asthma   . Boil 01/05/2017  . 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 Complex tear of medial meniscus of left knee as current injury 02/25/2016  . Complication of anesthesia    hard to wake up  . COPD (chronic obstructive pulmonary disease) (HCC)    chronic bronchitis  . Derangement of anterior horn of lateral meniscus of left knee 02/25/2016  . Duodenitis without mention of hemorrhage 01/01/2002  . DVT (deep venous thrombosis) (HCC)    history clot lt groin when she had lymphoma-in remision now.  . Elevated triglycerides with high cholesterol 01/05/2017  . Gastroparesis   . GERD (gastroesophageal reflux disease)   . Hiatal hernia 01/01/2002  . HIV infection ([place])    CD4 = 570 (05/2010), VL undetectation  . Hyperlipidemia   . IBS (irritable bowel syndrome)   . Migraines    on topamax and triptans, frequent (almost daily) attacks   . 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 Beryle Beams is her oncologist.   . PUD (peptic ulcer disease) 07/1997   per EGD report 07/1997 with history of esophagitis  . Restless leg syndrome 12/24/2015  . Shingles    in lumbar dermatome  . Spondylolisthesis of lumbar region    L5-S1  . TMJ (dislocation of temporomandibular joint)   . Traumatic tear of lateral meniscus of right knee 04/20/2012  . Vaginal yeast infection 09/25/2017   Review of Systems:  12 point ROS preformed. All negative aside from those mentioned in the HPI.  Physical Exam: Vitals:   06/04/18 1519  BP: (!) 138/58    Pulse: 79  Temp: 98 F (36.7 C)  TempSrc: Oral  SpO2: 99%  Height: 5\' 6"  (1.676 m)   General: Obese female in no acute distress Pulm: Good air movement with no wheezing or crackles  CV: RRR, no murmurs, no rubs  Abdomen: Active bowel sounds, soft, non-distended, no tenderness to palpation   Assessment & Plan:   See Encounters Tab for problem based charting.  Patient discussed with Dr. Angelia Mould

## 2018-06-04 NOTE — Patient Instructions (Signed)
Thank you for allowing [place] to provide your care. Today we added a medication called Amitriptyline. You will take 1 tablet per night for 7 days then increase to 2 tablets. This should help with your nerve pain, insomnia, and depression. Please let me know if you feel it is not helping after 4-5 weeks and we can switch medications.   I will see you back in 2-3 months to see how you are doing.

## 2018-06-04 NOTE — Telephone Encounter (Signed)
Call to [facility] spoke with representative there who said that Zolpdem has a quantity limit of only 15 tablets per month.  Will submit PA request for quantity of15 tablets for 30 days along with documentation of failed dosage Zolpidem 10 mg tablets.  Sander Nephew, RN 06/04/2018 4:20 PM.

## 2018-06-05 ENCOUNTER — Encounter: Payer: Self-pay | Admitting: Internal Medicine

## 2018-06-05 NOTE — Assessment & Plan Note (Signed)
Patient with very difficult to treat insomnia and has been on multiple medications in the past. She has been on Ambien 10 mg for approximately 10 months. She states that this medication helps her get 4 to 5 hours of sleep per night. She continues to have difficulty with initiation and maintenance of sleep. She was wanting to try the Ambien extended release however insurance is not approved this. We will continue with Ambien for now as it does provide some relief. We will also start amitriptyline 25 mg QHS which may help with her neuropathic pain, insomnia, and depression.

## 2018-06-05 NOTE — Assessment & Plan Note (Signed)
>>  ASSESSMENT AND PLAN FOR TYPE 2 DIABETES MELLITUS (HCC) WRITTEN ON 06/05/2018  2:06 PM BY HELBERG, JUSTIN, MD  Patient with well-controlled diabetes mellitus on Farxiga and Januvia. Recent A1c <7. She is tolerating all these medications without difficulty. She has had weight gain but states that she will continue to work on lifestyle modifications including being more active, changing her diet, and working on weight loss. She does have a follow-up with endocrinology in January. No changes to medical management at this point.

## 2018-06-05 NOTE — Assessment & Plan Note (Signed)
Patient endorses signs and symptoms of depression in the setting of multiple chronic illnesses and life stressors. She endorses increased fatigue, changes in mood, guilt, changes in appetite, and feelings of depression. She was previously on medication for her depression however stopped taking it because she felt that she was in remission. Prior medications include Zoloft, Cymbalta, and Wellbutrin. She states that she got good relief with the duloxetine in the past. We discussed trying to tricyclic antidepressant target her concurrent insomnia and neuropathic pain. She is amenable to this. If after 4 to 6 weeks she is not noticed any improvement in her depression we will switch her to duloxetine after taper.

## 2018-06-05 NOTE — Assessment & Plan Note (Signed)
She will continue to follow with infectious disease.

## 2018-06-05 NOTE — Assessment & Plan Note (Signed)
Patient with well-controlled diabetes mellitus on Farxiga and Januvia. Recent A1c <7. She is tolerating all these medications without difficulty. She has had weight gain but states that she will continue to work on lifestyle modifications including being more active, changing her diet, and working on weight loss. She does have a follow-up with endocrinology in January. No changes to medical management at this point.

## 2018-06-05 NOTE — Assessment & Plan Note (Signed)
Patient endorses signs and symptoms of interventional yeast infection. She has frequent yeast infections and cultures have shown resistant strain of [place]. She has been cautioned against the use of antifungals by both myself and infectious disease, Dr. Tommy Medal. She states that she will use these sparingly but needs the topical cream as she is having severe symptoms.

## 2018-06-05 NOTE — Assessment & Plan Note (Signed)
Patient with known spondylolysis. Follows with sports medicine, Dr. Oneida Alar. Repeat x-ray showed progression of her slip. I have referred her to orthopedic surgery as recommended Dr. Oneida Alar. Given that she is having neurological symptoms I do feel this is a fairly urgent referral. I offered her gabapentin for her neuropathic pain; however, she declined stating that she has been on this in the past and does not like the way it makes her feel. We discussed trying amitriptyline given her concurrent insomnia and depression. She is amenable to this. If this does not work we will consider duloxetine while we wait to get her into orthopedic surgery.

## 2018-06-07 NOTE — Progress Notes (Signed)
Internal Medicine Clinic Attending  Case discussed with Dr. Helberg at the time of the visit.  We reviewed the resident's history and exam and pertinent patient test results.  I agree with the assessment, diagnosis, and plan of care documented in the resident's note.    

## 2018-06-11 ENCOUNTER — Ambulatory Visit: Payer: Medicaid Other | Admitting: Gastroenterology

## 2018-06-14 NOTE — Telephone Encounter (Signed)
[id number].

## 2018-06-19 ENCOUNTER — Other Ambulatory Visit: Payer: Self-pay

## 2018-06-19 DIAGNOSIS — E118 Type 2 diabetes mellitus with unspecified complications: Secondary | ICD-10-CM

## 2018-06-19 MED ORDER — DAPAGLIFLOZIN PROPANEDIOL 10 MG PO TABS: 10.0000 mg | ORAL_TABLET | Freq: Every day | ORAL | 1 refills | Status: DC

## 2018-06-19 MED ORDER — SITAGLIPTIN PHOSPHATE 100 MG PO TABS: 100.0000 mg | ORAL_TABLET | Freq: Every day | ORAL | 1 refills | Status: DC

## 2018-06-19 NOTE — Telephone Encounter (Signed)
sitaGLIPtin (JANUVIA) 100 MG tablet,   dapagliflozin propanediol (FARXIGA) 10 MG TABS tablet   Request PA, pt is using [facility],

## 2018-06-20 DIAGNOSIS — E1165 Type 2 diabetes mellitus with hyperglycemia: Secondary | ICD-10-CM | POA: Diagnosis not present

## 2018-06-20 DIAGNOSIS — Z794 Long term (current) use of insulin: Secondary | ICD-10-CM | POA: Diagnosis not present

## 2018-06-21 ENCOUNTER — Ambulatory Visit: Payer: Medicaid Other | Admitting: Neurology

## 2018-06-21 ENCOUNTER — Other Ambulatory Visit: Payer: Medicaid Other

## 2018-06-21 ENCOUNTER — Encounter: Payer: Self-pay | Admitting: Neurology

## 2018-06-21 DIAGNOSIS — K219 Gastro-esophageal reflux disease without esophagitis: Secondary | ICD-10-CM

## 2018-06-21 DIAGNOSIS — G43809 Other migraine, not intractable, without status migrainosus: Secondary | ICD-10-CM | POA: Diagnosis not present

## 2018-06-21 DIAGNOSIS — G43719 Chronic migraine without aura, intractable, without status migrainosus: Secondary | ICD-10-CM

## 2018-06-21 DIAGNOSIS — G43701 Chronic migraine without aura, not intractable, with status migrainosus: Secondary | ICD-10-CM | POA: Diagnosis not present

## 2018-06-21 DIAGNOSIS — E1165 Type 2 diabetes mellitus with hyperglycemia: Secondary | ICD-10-CM | POA: Diagnosis not present

## 2018-06-21 DIAGNOSIS — B20 Human immunodeficiency virus [HIV] disease: Secondary | ICD-10-CM | POA: Diagnosis not present

## 2018-06-21 DIAGNOSIS — Z0289 Encounter for other administrative examinations: Secondary | ICD-10-CM

## 2018-06-21 DIAGNOSIS — G4700 Insomnia, unspecified: Secondary | ICD-10-CM

## 2018-06-21 DIAGNOSIS — E782 Mixed hyperlipidemia: Secondary | ICD-10-CM | POA: Diagnosis not present

## 2018-06-21 DIAGNOSIS — M4316 Spondylolisthesis, lumbar region: Secondary | ICD-10-CM

## 2018-06-21 DIAGNOSIS — F330 Major depressive disorder, recurrent, mild: Secondary | ICD-10-CM

## 2018-06-21 MED ORDER — AMITRIPTYLINE HCL 25 MG PO TABS: ORAL_TABLET | ORAL | 3 refills | Status: DC

## 2018-06-21 NOTE — Progress Notes (Signed)
PATIENT: [name] DOB: [birth year]  Chief Complaint  Patient presents with  . Migraine    Botox 100 units x 2 vials      HISTORICAL  [name], Is accompanied by her sister returned for Botox injection as migraine prevention  She has past medical history of HIV, hyperlipidemia, migraines, depression, and non-Hodgkin lymphoma in 2005 with chemotherapy and radiation.   She has history of migraines since 19. In the last 10 years, she reports having daily migraines. She is currently taking Topamax and Zomig with relief. She takes approximately 20-25 tablets of her Zomig per month.    She has aura's that look like floating dirt particles, flashing lights, and she sees Christmas tree lights when they are severe, she has nausea, vomiting and sometimes diarrhea when they're severe. She also complains of diplopia, blurry vision, and  photophobia. She also has had a lightning bolt sensation throughout her body.  She describes them as pulsating and throbbing  on a scale of 5-6/10 which she considers mild, when they are severe they can go as high as 10/10. They usually start in the right temporal area, but will also start on the left temporal and are worse.   Any type of smoke or MSG can precipitate a migraine. They generally last 2-9 days. She has been to the emergency department twice in the last year for migraines. She states "I just can't do this anymore I need to do something about her migraines"  She has tried and failed different preventive medications:  atenolol, verapamil, nortriptyline, Inderal, Depakote without benefit. Atenolol and verapamil caused hypotension. Depakote caused severe vomiting. Topamax causing her worsening headaches, will show tried Zonegran, Topamax ER, she denies significant improvement.  For abortive treatment, she has tried Imitrex, Maxalt with suboptimal response, Zomig as needed since to work best,  she also takes transderm scophalamine patch for nause  I  started to give her BOTOX injection since May 2013, every 3 months, she responded very well,   Previous MRI brain was normal in 2013.  Last Botox injection as migraine prevention was March 2017, she responded very well, but in recent few weeks, she noticed increased migraine headaches, this baby had headache left retro-orbital area severe pounding headache with associated light noise sensitivity, has been ongoing for 3 days, failed to respond to Zomig by mouth, Imitrex injection as needed, she has constant dry heaves, difficulty opening her eyes, In addition, today she complains of restless leg symptoms, difficult to stand or lying still for extended period of time, has tried Neurontin-cause nausea, confusion,  Lyrica-cause excessive weight gain, she has never tried dopamine agonist in the past  I reviewed laboratory in April 2017, normal hemoglobin 12 point 9, mild elevated RDW 15.8, decreased MCV   Update April 27 2016  Last Botox injection was in June 2017, she had left knee arthroscopic surgery in August 2017, complaining of significant joints pain, unsteady gait, before Botox she was having moderate to severe headache daily, even Botox now, she used to 12 tablets of Zomig every months because of frequent moderate to severe headache  Update August 10 2016: Last Botox injection was in October 2017, has been very helpful, but over the past 3-4 weeks, she had frequent almost daily headaches, oftentimes wake her up from sleep, Zomig has been very helpful,  Low dose Mirapex 0.25 milligrams 2 tablets every night has been very helpful for her restless leg symptoms, she reported 80% improvement, laboratory reviewed in  January 2018, CD4 count 760, normal hemoglobin 12.3, mildly decreased MCV, increased RDW,  Update Nov 09 2016: She suffered bilateral ear infection, strep throat since last visit, had frequent headaches, nausea,  Update February 22 2017: She responded well to previous injection,  complains of a lot of stress at home  UPDATE May 31 2017: She complains suboptimal response to Botox injection, had daily headache over last week, also complains of a lot of stress at home, average more than 4 headaches in 1 week, lasting all day, with associated nausea vomiting, is taking Zomig as needed, with partial response  UPDATE September 08 2017: She complains of excessive stress, frequent migraine headaches,  UPDATE December 20 2017: She is accompanied by her sister at today's clinical visit, Ajovy injection has helped her migraine, she rated as 25%, but with the combination of Ajovy and Botox injection, she still has 3 migraine headaches each week, has been dealing with daily headaches over the past 1 week, required multiple dose of Zomig treatment  UPDATE Sept 25 2019: She responded well to previous injection, today she complains of 2 weeks history of severe headaches failed home remedy treatment, multiple dose of Zomig,  She is also getting Ajovy injection every month, which has been helpful,  UPDATE Jun 21 2018: she overall responding well to Botox, Ajovy, also taking amitriptyline 25 mg 2 tablets every night, Zomig as needed for abortive treatment, couple headaches each week, usually responding well to triptan treatment, but this particular headache been ongoing for 2 weeks, tried and failed multiple home medications.  REVIEW OF SYSTEMS: Full 14 system review of systems performed and notable only for as above  ALLERGIES: Allergies  Allergen Reactions  . Diazepam     Triggers migraines  . Divalproex Sodium Nausea And Vomiting    Causes light-headedness  . Gabapentin Nausea And Vomiting    confusion  . Ondansetron Hcl Nausea And Vomiting    Triggers migraines, "makes me vomit"  . Penicillins     REACTION: lips swollen to point of bleeding, sever vaginal irritation  Reports tolerating augmentin well without any complaints or reaction  . Pineapple   . Propoxyphene  N-Acetaminophen Nausea And Vomiting  . Simvastatin     PATIENT CANNOT BE PRESCRIBED THIS STATIN WHILE RECEIVING NORVIR  . Topamax [Topiramate]     Migraines   . Tramadol Hcl Itching    REACTION: migraines and itching--hx of migraines even without medication   . Zocor [Simvastatin - High Dose]     HOME MEDICATIONS: Current Outpatient Medications  Medication Sig Dispense Refill  . AJOVY 225 MG/1.5ML SOSY INJECT 225MG SUBCUTANEOUSLY ONCE EVERY 30 DAYS 1.5 mL 11  . albuterol (PROAIR HFA) 108 (90 Base) MCG/ACT inhaler USE 1 TO 2 PUFFS EVERY 4 TO 6 HOURS AS NEEDED SHORTNESS OF BREATH OR WHEEZING 8.5 g 1  . amitriptyline (ELAVIL) 25 MG tablet Take 1 tablet (59m) every night for 7 days then increase to 2 tablets (525m every night 60 tablet 2  . Beclomethasone Dipropionate 80 MCG/ACT AERS Place 2 Inhalers into the nose daily. 8.7 g 10  . blood glucose meter kit and supplies Dispense based on patient and insurance preference. Use up to four times daily as directed. (FOR ICD-9 250.00, 250.01). 1 each 0  . BOTOX 100 units SOLR injection MD IS TO INJECT 200 UNITS INTRAMUSCULARLY INTO HEAD AND NECK MUSCLES EVERY 3 MONTHS 2 each 3  . Calcium Carbonate-Vitamin D (CALCIUM-VITAMIN D) 500-200 MG-UNIT per tablet Take 2 tablets  by mouth daily.     . dapagliflozin propanediol (FARXIGA) 10 MG TABS tablet Take 10 mg by mouth daily. 90 tablet 1  . Darunavir-Cobicisctat-Emtricitabine-Tenofovir Alafenamide (SYMTUZA) 800-150-200-10 MG TABS Take 1 tablet by mouth daily with breakfast. 30 tablet 11  . diclofenac sodium (VOLTAREN) 1 % GEL APPLY TOPICALLY TO AFFECTED AREA 4 GRAMS4 TIMES DAILY 100 g 11  . DULERA 100-5 MCG/ACT AERO USE 2 PUFFS BY MOUTH THE FIRST THING IN THE MORNING AND THEN ANOTHER 2 PUFFS ABOUT 12 HOURS LATER 13 g 5  . fluconazole (DIFLUCAN) 100 MG tablet TAKE 1 TABLET BY MOUTH EVERY DAY 14 tablet 3  . fluocinonide (LIDEX) 0.05 % external solution 1 gtt AU qD prn itching    . fluticasone (FLONASE) 50  MCG/ACT nasal spray USE 2 SPRAYS IN EACH NOSTRIL ONCE DAILY 16 g 0  . glucose blood test strip Use as instructed 100 each 12  . lisinopril (PRINIVIL,ZESTRIL) 10 MG tablet TAKE 1 TABLET BY MOUTH EVERY DAY FOR BLOOD PRESSURE 90 tablet 2  . lubiprostone (AMITIZA) 24 MCG capsule Take 1 capsule (24 mcg total) by mouth 2 (two) times daily with a meal. 180 capsule 1  . pantoprazole (PROTONIX) 40 MG tablet TAKE 1 TABLET BY MOUTH 2 TIMES A DAY 60 tablet 4  . pramipexole (MIRAPEX) 0.25 MG tablet Take 3 tablets (0.75 mg total) by mouth at bedtime. 90 tablet 11  . pravastatin (PRAVACHOL) 40 MG tablet TAKE 1 TABLET BY MOUTH EVERY DAY 90 tablet 1  . prochlorperazine (COMPAZINE) 10 MG tablet TAKE 1 TABLET BY MOUTH EVERY 6 HOURS AS NEEDED NAUSEA OR VOMITING 30 tablet 11  . scopolamine (TRANSDERM-SCOP, 1.5 MG,) 1 MG/3DAYS PLACE 1 PATCH ON SKIN EVERY 3 DAYS 10 patch 11  . sitaGLIPtin (JANUVIA) 100 MG tablet Take 1 tablet (100 mg total) by mouth daily. 90 tablet 1  . Tamsulosin HCl (FLOMAX) 0.4 MG CAPS Take 0.4 mg by mouth daily. 1-2 daily    . terconazole (TERAZOL 7) 0.4 % vaginal cream Place 1 applicator vaginally at bedtime. 45 g 0  . tiZANidine (ZANAFLEX) 4 MG tablet TAKE 1 TABLET BY MOUTH 2 TIMES A DAY AS NEEDED FOR MUSCLE SPASMS 60 tablet 11  . zolmitriptan (ZOMIG) 5 MG tablet Take 1 tab at onset of migraine.  May repeat, with 1 tab, in 2 hrs, if needed.  Max dose of 2 tabs/24hrs.  Must last 30 days. #12/30 12 tablet 11  . zolpidem (AMBIEN) 10 MG tablet TAKE 1 TABLET BY MOUTH EACH NIGHT AT BEDTIME AS NEEDED SLEEP 30 tablet 2   No current facility-administered medications for this visit.     PAST MEDICAL HISTORY: Past Medical History:  Diagnosis Date  . Acute gastritis without mention of hemorrhage 01/01/2002  . Anemia 10/2009   macrocytic anemia with baseline MCV 104-106  . Anxiety   . Asthma   . Boil 01/05/2017  . 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 Complex tear of medial meniscus of left knee as  current injury 02/25/2016  . Complication of anesthesia    hard to wake up  . COPD (chronic obstructive pulmonary disease) (HCC)    chronic bronchitis  . Derangement of anterior horn of lateral meniscus of left knee 02/25/2016  . Duodenitis without mention of hemorrhage 01/01/2002  . DVT (deep venous thrombosis) (HCC)    history clot lt groin when she had lymphoma-in remision now.  . Elevated triglycerides with high cholesterol 01/05/2017  . Gastroparesis   . GERD (gastroesophageal  reflux disease)   . Hiatal hernia 01/01/2002  . HIV infection ([place])    CD4 = 570 (05/2010), VL undetectation  . Hyperlipidemia   . IBS (irritable bowel syndrome)   . Migraines    on topamax and triptans, frequent (almost daily) attacks   . Non Hodgkin's lymphoma ([place])    stage II, s/p resection, chemotherapy, radiotherapy, Dr. Beryle Beams is her oncologist.   . PUD (peptic ulcer disease) 07/1997   per EGD report 07/1997 with history of esophagitis  . Restless leg syndrome 12/24/2015  . Shingles    in lumbar dermatome  . Spondylolisthesis of lumbar region    L5-S1  . TMJ (dislocation of temporomandibular joint)   . Traumatic tear of lateral meniscus of right knee 04/20/2012  . Vaginal yeast infection 09/25/2017    PAST SURGICAL HISTORY: Past Surgical History:  Procedure Laterality Date  . CHOLECYSTECTOMY    . ENDOMETRIAL ABLATION    . KNEE ARTHROSCOPY Right 04/20/12  . KNEE ARTHROSCOPY WITH LATERAL MENISECTOMY Left 02/25/2016   Procedure: LEFT KNEE ARTHROSCOPY WITH MEDIAL AND  LATERAL MENISECTOMIES;  Surgeon: Marchia Bond, MD;  Location: [facility];  Service: Orthopedics;  Laterality: Left;  . KNEE ARTHROSCOPY WITH MEDIAL MENISECTOMY Left 02/25/2016   Procedure: KNEE ARTHROSCOPY WITH MEDIAL MENISECTOMY;  Surgeon: Marchia Bond, MD;  Location: [facility];  Service: Orthopedics;  Laterality: Left;  . TONSILLECTOMY    . TYMPANOPLASTY Left   . TYMPANOSTOMY TUBE PLACEMENT     as  child-both  . UPPER GASTROINTESTINAL ENDOSCOPY  2003, 2007   done by Dr. Velora Heckler    FAMILY HISTORY: Family History  Problem Relation Age of Onset  . Hypertension Mother   . Stroke Mother   . Heart disease Mother   . Diabetes Mother   . Breast cancer Paternal Grandmother   . Kidney cancer Maternal Uncle   . Heart disease Maternal Uncle   . Diabetes Maternal Uncle   . Diabetes Maternal Grandmother   . Heart disease Maternal Grandmother   . Emphysema Paternal Uncle        never smoker  . Emphysema Paternal Grandfather        never smoker  . Colon cancer Paternal Aunt        with met to lung   . Non-Hodgkin's lymphoma Maternal Uncle   . Heart disease Maternal Grandfather     SOCIAL HISTORY:  Social History   Socioeconomic History  . Marital status: Divorced    Spouse name: Not on file  . Number of children: 0  . Years of education: 62  . Highest education level: Not on file  Occupational History  . Occupation: disabled    Fish farm manager: UNEMPLOYED  Social Needs  . Financial resource strain: Not on file  . Food insecurity:    Worry: Not on file    Inability: Not on file  . Transportation needs:    Medical: Not on file    Non-medical: Not on file  Tobacco Use  . Smoking status: Never Smoker  . Smokeless tobacco: Never Used  Substance and Sexual Activity  . Alcohol use: No    Alcohol/week: 0.0 standard drinks  . Drug use: No  . Sexual activity: Not Currently    Partners: Male    Birth control/protection: Surgical  Lifestyle  . Physical activity:    Days per week: Not on file    Minutes per session: Not on file  . Stress: Not on file  Relationships  .  Social connections:    Talks on phone: Not on file    Gets together: Not on file    Attends religious service: Not on file    Active member of club or organization: Not on file    Attends meetings of clubs or organizations: Not on file    Relationship status: Not on file  . Intimate partner violence:    Fear  of current or ex partner: Not on file    Emotionally abused: Not on file    Physically abused: Not on file    Forced sexual activity: Not on file  Other Topics Concern  . Not on file  Social History Narrative      Patient is disabled.   Right handed.   Caffeine - Diet soda., tea   Education - high school     PHYSICAL EXAM   Vitals:   06/21/18 1410  BP: 140/87  Pulse: (!) 104  Weight: 276 lb (125.2 kg)  Height: 5' 1" (1.549 m)    Not recorded      Body mass index is 52.15 kg/m.  PHYSICAL EXAMNIATION:  Gen: NAD, conversant, well nourised, obese, well groomed                     Cardiovascular: Regular rate rhythm, no peripheral edema, warm, nontender. Eyes: Conjunctivae clear without exudates or hemorrhage Neck: Supple, no carotid bruise. Pulmonary: Clear to auscultation bilaterally   NEUROLOGICAL EXAM:  MENTAL STATUS: Speech:    Speech is normal; fluent and spontaneous with normal comprehension.  Cognition:     Orientation to time, place and person     Normal recent and remote memory     Normal Attention span and concentration     Normal Language, naming, repeating,spontaneous speech     Fund of knowledge   CRANIAL NERVES: CN II: Visual fields are full to confrontation. Pupils are round equal and briskly reactive to light. CN III, IV, VI: extraocular movement are normal. No ptosis. CN V: Facial sensation is intact to pinprick in all 3 divisions bilaterally. Corneal responses are intact.  CN VII: Face is symmetric with normal eye closure and smile. CN VIII: Hearing is normal to rubbing fingers CN IX, X: Palate elevates symmetrically. Phonation is normal. CN XI: Head turning and shoulder shrug are intact CN XII: Tongue is midline with normal movements and no atrophy.  MOTOR: There is no pronator drift of out-stretched arms. Muscle bulk and tone are normal. Muscle strength is normal.  REFLEXES: Reflexes are 2+ and symmetric at the biceps, triceps, knees,  and ankles. Plantar responses are flexor.  SENSORY: Intact to light touch, pinprick, positional sensation and vibratory sensation are intact in fingers and toes.  COORDINATION: Rapid alternating movements and fine finger movements are intact. There is no dysmetria on finger-to-nose and heel-knee-shin.    GAIT/STANCE: Posture is normal. Gait is steady with normal steps, base, arm swing, and turning. Heel and toe walking are normal. Tandem gait is normal.  Romberg is absent.  DIAGNOSTIC DATA (LABS, IMAGING, TESTING) - I reviewed patient records, labs, notes, testing and imaging myself where available.   ASSESSMENT AND PLAN  [name] is a 52 y.o. female    History of HIV, hypercholesterolemia, Non hodgkin' lymphoma with chemotherapy and radiation and migraines since age 2.  Chronic migraine headaches She has tried and failed multiple preventive medications including atenolol, verapamil, nortriptyline, and Depakote,Topamax, Topamax ER, zonegran could not tolerate Cardizem,  She has migraines daily  for more than  10 years. She is currently taking amitriptyline 25 mg 2 tablets every day and Zomig with some relief. Previously tried imitrex and Maxalt with suboptimal response,  Normal MRI brain, and neurological exam.  BOTOX injection was performed according to protocol by [employer]. 100 units of BOTOX was dissolved into 2 cc NS  Used total 200 units  BOTOX injection was performed according to protocol by [employer]. 100 units of BOTOX was dissolved into 2 cc NS.     I have skipped bilateral corrugate and Procerues injection today to avoid the long-term side effect Extra 25 units was injected into bilateral temporal parietal region 10 units were injected into bilateral masseters  Patient tolerate the injection well.  Iv Depacon infusion today continue AJOVY monthly injection    Restless leg symptoms   Continue Mirapex 0.2 5 mg,  3 tablets every night  Ferritin 51 in Feb  2018,     Marcial Pacas, M.D. Ph.D.  [facility] [address] [place], [place] [address] Ph: [phone] Fax: [phone]  CC: Referring Provider

## 2018-06-21 NOTE — Progress Notes (Signed)
**  Botox 100 units x 2 vials NDC 8185-6314-97, Lot [id number], Exp 04/2020, specialty pharmacy.//mck,rn**

## 2018-06-22 LAB — T-HELPER CELL (CD4) - (RCID CLINIC ONLY)
CD4 T CELL HELPER: 35 % (ref 33–55)
CD4 T Cell Abs: 680 /uL (ref 400–2700)

## 2018-06-23 LAB — CBC WITH DIFFERENTIAL/PLATELET
Absolute Monocytes: 328 cells/uL (ref 200–950)
BASOS ABS: 20 {cells}/uL (ref 0–200)
Basophils Relative: 0.3 %
EOS ABS: 80 {cells}/uL (ref 15–500)
Eosinophils Relative: 1.2 %
HCT: 43.5 % (ref 35.0–45.0)
HEMOGLOBIN: 14 g/dL (ref 11.7–15.5)
Lymphs Abs: 1956 cells/uL (ref 850–3900)
MCH: 25.2 pg — AB (ref 27.0–33.0)
MCHC: 32.2 g/dL (ref 32.0–36.0)
MCV: 78.4 fL — ABNORMAL LOW (ref 80.0–100.0)
MONOS PCT: 4.9 %
MPV: 9.5 fL (ref 7.5–12.5)
NEUTROS ABS: 4315 {cells}/uL (ref 1500–7800)
NEUTROS PCT: 64.4 %
Platelets: 262 10*3/uL (ref 140–400)
RBC: 5.55 10*6/uL — ABNORMAL HIGH (ref 3.80–5.10)
RDW: 15.2 % — AB (ref 11.0–15.0)
Total Lymphocyte: 29.2 %
WBC: 6.7 10*3/uL (ref 3.8–10.8)

## 2018-06-23 LAB — COMPLETE METABOLIC PANEL WITH GFR
AG Ratio: 1.4 (calc) (ref 1.0–2.5)
ALT: 9 U/L (ref 6–29)
AST: 12 U/L (ref 10–35)
Albumin: 4.4 g/dL (ref 3.6–5.1)
Alkaline phosphatase (APISO): 67 U/L (ref 33–130)
BUN: 12 mg/dL (ref 7–25)
CO2: 30 mmol/L (ref 20–32)
CREATININE: 0.81 mg/dL (ref 0.50–1.05)
Calcium: 10.1 mg/dL (ref 8.6–10.4)
Chloride: 99 mmol/L (ref 98–110)
GFR, EST AFRICAN AMERICAN: 97 mL/min/{1.73_m2} (ref >=60)
GFR, EST NON AFRICAN AMERICAN: 84 mL/min/{1.73_m2} (ref >=60)
GLOBULIN: 3.1 g/dL (ref 1.9–3.7)
Glucose, Bld: 145 mg/dL — ABNORMAL HIGH (ref 65–99)
Potassium: 4.2 mmol/L (ref 3.5–5.3)
Sodium: 138 mmol/L (ref 135–146)
TOTAL PROTEIN: 7.5 g/dL (ref 6.1–8.1)
Total Bilirubin: 0.3 mg/dL (ref 0.2–1.2)

## 2018-06-23 LAB — HIV-1 RNA QUANT-NO REFLEX-BLD
HIV 1 RNA Quant: <20 copies/mL
HIV-1 RNA Quant, Log: <1.3 Log copies/mL

## 2018-06-23 LAB — RPR: RPR Ser Ql: NONREACTIVE

## 2018-06-23 LAB — LIPID PANEL
Cholesterol: 230 mg/dL — ABNORMAL HIGH (ref <200)
HDL: 35 mg/dL — AB (ref >50)
LDL Cholesterol (Calc): 137 mg/dL (calc) — ABNORMAL HIGH
NON-HDL CHOLESTEROL (CALC): 195 mg/dL — AB (ref <130)
Total CHOL/HDL Ratio: 6.6 (calc) — ABNORMAL HIGH (ref <5.0)
Triglycerides: 377 mg/dL — ABNORMAL HIGH (ref <150)

## 2018-06-25 ENCOUNTER — Telehealth: Payer: Self-pay | Admitting: *Deleted

## 2018-06-25 NOTE — Telephone Encounter (Signed)
Call to [facility] for [name] was approved 06/21/2018 thru 06/21/2019. Sander Nephew, RN 06/25/2018 2:10 PM.

## 2018-07-02 ENCOUNTER — Other Ambulatory Visit: Payer: Self-pay | Admitting: Obstetrics and Gynecology

## 2018-07-02 DIAGNOSIS — Z1231 Encounter for screening mammogram for malignant neoplasm of breast: Secondary | ICD-10-CM

## 2018-07-05 ENCOUNTER — Encounter: Payer: Self-pay | Admitting: Infectious Disease

## 2018-07-05 ENCOUNTER — Ambulatory Visit (INDEPENDENT_AMBULATORY_CARE_PROVIDER_SITE_OTHER): Payer: Medicaid Other | Admitting: Infectious Disease

## 2018-07-05 ENCOUNTER — Telehealth: Payer: Self-pay | Admitting: *Deleted

## 2018-07-05 VITALS — BP 138/83 | HR 111 | Temp 98.6°F | Wt 280.0 lb

## 2018-07-05 DIAGNOSIS — G43719 Chronic migraine without aura, intractable, without status migrainosus: Secondary | ICD-10-CM | POA: Diagnosis not present

## 2018-07-05 DIAGNOSIS — E785 Hyperlipidemia, unspecified: Secondary | ICD-10-CM | POA: Diagnosis not present

## 2018-07-05 DIAGNOSIS — M4316 Spondylolisthesis, lumbar region: Secondary | ICD-10-CM

## 2018-07-05 DIAGNOSIS — B373 Candidiasis of vulva and vagina: Secondary | ICD-10-CM | POA: Diagnosis not present

## 2018-07-05 DIAGNOSIS — B20 Human immunodeficiency virus [HIV] disease: Secondary | ICD-10-CM | POA: Diagnosis not present

## 2018-07-05 DIAGNOSIS — E119 Type 2 diabetes mellitus without complications: Secondary | ICD-10-CM

## 2018-07-05 DIAGNOSIS — B3731 Acute candidiasis of vulva and vagina: Secondary | ICD-10-CM

## 2018-07-05 DIAGNOSIS — Z6841 Body Mass Index (BMI) 40.0 and over, adult: Secondary | ICD-10-CM

## 2018-07-05 MED ORDER — DARUN-COBIC-EMTRICIT-TENOFAF 800-150-200-10 MG PO TABS: 1.0000 | ORAL_TABLET | Freq: Every day | ORAL | 11 refills | Status: DC

## 2018-07-05 NOTE — Progress Notes (Signed)
Chief complaints: Migrainous headaches, vaginal dc which she believes is yeast infection, neuropathic pain, depression Subjective:    Patient ID: [name], female    DOB: [birth year], 53 y.o.   MRN: [id number]  HPI  [name] is a 53 y.o. female who is doing superbly well on her  antiviral regimen, SYMTUZA with undetectable viral load and health cd4 count.   Comorbid diabetes mellitus morbid obesity degenerative disc disease and osteoarthritis.  When I saw her today sign was also suffering from a brain headache.  She is being seen by neurology and having Botox injections for this along with medical therapy.  She has had apparently several falls and continues to suffer from what sounds to be neuropathic pain.  She does have worsening on his plain films of her lumbar spine of her anteriorlisthesis L5-S1 with associated pars defect, now grade 2.  She has hip pain as well though plain films of hip allergies.  She is been referred to orthopedic surgery and is on the impression she might have been referred to neurosurgery though not clear that has been done yet.  She been suffering for quite a bit of depressive symptoms and insomnia despite multiple medications.  She enjoys seeing internal medicine for primary care she is not interested in seeing a psychiatrist even though I think one could benefit her.  Apparently she has had bad experiences with counseling in the past.  Also says that she is having vaginal discharge and that she is worried that she has a yeast infection.  She has not tried any topical drugs which I would like her to try first before initiating higher dose of fluconazole given her history of Candida glabrata being isolated from cultures.   Past Medical History:  Diagnosis Date  . Acute gastritis without mention of hemorrhage 01/01/2002  . Anemia 10/2009   macrocytic anemia with baseline MCV 104-106  . Anxiety   . Asthma   . Boil 01/05/2017  . Chondromalacia of right knee  04/20/2012  . Complex tear of medial meniscus of left knee as current injury 02/25/2016  . Complication of anesthesia    hard to wake up  . COPD (chronic obstructive pulmonary disease) (HCC)    chronic bronchitis  . Derangement of anterior horn of lateral meniscus of left knee 02/25/2016  . Duodenitis without mention of hemorrhage 01/01/2002  . DVT (deep venous thrombosis) (HCC)    history clot lt groin when she had lymphoma-in remision now.  . Elevated triglycerides with high cholesterol 01/05/2017  . Gastroparesis   . GERD (gastroesophageal reflux disease)   . Hiatal hernia 01/01/2002  . HIV infection ([place])    CD4 = 570 (05/2010), VL undetectation  . Hyperlipidemia   . IBS (irritable bowel syndrome)   . Migraines    on topamax and triptans, frequent (almost daily) attacks   . Non Hodgkin's lymphoma ([place])    stage II, s/p resection, chemotherapy, radiotherapy, Dr. Beryle Beams is her oncologist.   . PUD (peptic ulcer disease) 07/1997   per EGD report 07/1997 with history of esophagitis  . Restless leg syndrome 12/24/2015  . Shingles    in lumbar dermatome  . Spondylolisthesis of lumbar region    L5-S1  . TMJ (dislocation of temporomandibular joint)   . Traumatic tear of lateral meniscus of right knee 04/20/2012  . Vaginal yeast infection 09/25/2017   Past Surgical History:  Procedure Laterality Date  . CHOLECYSTECTOMY    . ENDOMETRIAL ABLATION    .  KNEE ARTHROSCOPY Right 04/20/12  . KNEE ARTHROSCOPY WITH LATERAL MENISECTOMY Left 02/25/2016   Procedure: LEFT KNEE ARTHROSCOPY WITH MEDIAL AND  LATERAL MENISECTOMIES;  Surgeon: Marchia Bond, MD;  Location: [place];  Service: Orthopedics;  Laterality: Left;  . KNEE ARTHROSCOPY WITH MEDIAL MENISECTOMY Left 02/25/2016   Procedure: KNEE ARTHROSCOPY WITH MEDIAL MENISECTOMY;  Surgeon: Marchia Bond, MD;  Location: [place];  Service: Orthopedics;  Laterality: Left;  . TONSILLECTOMY    . TYMPANOPLASTY Left   .  TYMPANOSTOMY TUBE PLACEMENT     as child-both  . UPPER GASTROINTESTINAL ENDOSCOPY  2003, 2007   done by Dr. Velora Heckler   Family History  Problem Relation Age of Onset  . Hypertension Mother   . Stroke Mother   . Heart disease Mother   . Diabetes Mother   . Breast cancer Paternal Grandmother   . Kidney cancer Maternal Uncle   . Heart disease Maternal Uncle   . Diabetes Maternal Uncle   . Diabetes Maternal Grandmother   . Heart disease Maternal Grandmother   . Emphysema Paternal Uncle        never smoker  . Emphysema Paternal Grandfather        never smoker  . Colon cancer Paternal Aunt        with met to lung   . Non-Hodgkin's lymphoma Maternal Uncle   . Heart disease Maternal Grandfather    Social History   Tobacco Use  . Smoking status: Never Smoker  . Smokeless tobacco: Never Used  Substance Use Topics  . Alcohol use: No    Alcohol/week: 0.0 standard drinks  . Drug use: No   Allergies  Allergen Reactions  . Diazepam     Triggers migraines  . Divalproex Sodium Nausea And Vomiting    Causes light-headedness  . Gabapentin Nausea And Vomiting    confusion  . Ondansetron Hcl Nausea And Vomiting    Triggers migraines, "makes me vomit"  . Penicillins     REACTION: lips swollen to point of bleeding, sever vaginal irritation  Reports tolerating augmentin well without any complaints or reaction  . Pineapple   . Propoxyphene N-Acetaminophen Nausea And Vomiting  . Simvastatin     PATIENT CANNOT BE PRESCRIBED THIS STATIN WHILE RECEIVING NORVIR  . Topamax [Topiramate]     Migraines   . Tramadol Hcl Itching    REACTION: migraines and itching--hx of migraines even without medication   . Zocor [Simvastatin - High Dose]      Current Outpatient Medications:  .  AJOVY 225 MG/1.5ML SOSY, INJECT 225MG SUBCUTANEOUSLY ONCE EVERY 30 DAYS, Disp: 1.5 mL, Rfl: 11 .  albuterol (PROAIR HFA) 108 (90 Base) MCG/ACT inhaler, USE 1 TO 2 PUFFS EVERY 4 TO 6 HOURS AS NEEDED SHORTNESS OF  BREATH OR WHEEZING, Disp: 8.5 g, Rfl: 1 .  amitriptyline (ELAVIL) 25 MG tablet, Take 1 tablet (103m) every night for 7 days then increase to 2 tablets (542m every night, Disp: 180 tablet, Rfl: 3 .  Beclomethasone Dipropionate 80 MCG/ACT AERS, Place 2 Inhalers into the nose daily., Disp: 8.7 g, Rfl: 10 .  blood glucose meter kit and supplies, Dispense based on patient and insurance preference. Use up to four times daily as directed. (FOR ICD-9 250.00, 250.01)., Disp: 1 each, Rfl: 0 .  BOTOX 100 units SOLR injection, MD IS TO INJECT 200 UNITS INTRAMUSCULARLY INTO HEAD AND NECK MUSCLES EVERY 3 MONTHS, Disp: 2 each, Rfl: 3 .  Calcium Carbonate-Vitamin D (CALCIUM-VITAMIN D) 500-200  MG-UNIT per tablet, Take 2 tablets by mouth daily. , Disp: , Rfl:  .  dapagliflozin propanediol (FARXIGA) 10 MG TABS tablet, Take 10 mg by mouth daily., Disp: 90 tablet, Rfl: 1 .  Darunavir-Cobicisctat-Emtricitabine-Tenofovir Alafenamide (SYMTUZA) 800-150-200-10 MG TABS, Take 1 tablet by mouth daily with breakfast., Disp: 30 tablet, Rfl: 11 .  diclofenac sodium (VOLTAREN) 1 % GEL, APPLY TOPICALLY TO AFFECTED AREA 4 GRAMS4 TIMES DAILY, Disp: 100 g, Rfl: 11 .  DULERA 100-5 MCG/ACT AERO, USE 2 PUFFS BY MOUTH THE FIRST THING IN THE MORNING AND THEN ANOTHER 2 PUFFS ABOUT 12 HOURS LATER, Disp: 13 g, Rfl: 5 .  fluconazole (DIFLUCAN) 100 MG tablet, TAKE 1 TABLET BY MOUTH EVERY DAY, Disp: 14 tablet, Rfl: 3 .  fluocinonide (LIDEX) 0.05 % external solution, 1 gtt AU qD prn itching, Disp: , Rfl:  .  glucose blood test strip, Use as instructed, Disp: 100 each, Rfl: 12 .  lisinopril (PRINIVIL,ZESTRIL) 10 MG tablet, TAKE 1 TABLET BY MOUTH EVERY DAY FOR BLOOD PRESSURE, Disp: 90 tablet, Rfl: 2 .  lubiprostone (AMITIZA) 24 MCG capsule, Take 1 capsule (24 mcg total) by mouth 2 (two) times daily with a meal., Disp: 180 capsule, Rfl: 1 .  pantoprazole (PROTONIX) 40 MG tablet, TAKE 1 TABLET BY MOUTH 2 TIMES A DAY, Disp: 60 tablet, Rfl: 4 .   pramipexole (MIRAPEX) 0.25 MG tablet, Take 3 tablets (0.75 mg total) by mouth at bedtime., Disp: 90 tablet, Rfl: 11 .  pravastatin (PRAVACHOL) 40 MG tablet, TAKE 1 TABLET BY MOUTH EVERY DAY, Disp: 90 tablet, Rfl: 1 .  prochlorperazine (COMPAZINE) 10 MG tablet, TAKE 1 TABLET BY MOUTH EVERY 6 HOURS AS NEEDED NAUSEA OR VOMITING, Disp: 30 tablet, Rfl: 11 .  scopolamine (TRANSDERM-SCOP, 1.5 MG,) 1 MG/3DAYS, PLACE 1 PATCH ON SKIN EVERY 3 DAYS, Disp: 10 patch, Rfl: 11 .  sitaGLIPtin (JANUVIA) 100 MG tablet, Take 1 tablet (100 mg total) by mouth daily., Disp: 90 tablet, Rfl: 1 .  Tamsulosin HCl (FLOMAX) 0.4 MG CAPS, Take 0.4 mg by mouth daily. 1-2 daily, Disp: , Rfl:  .  terconazole (TERAZOL 7) 0.4 % vaginal cream, Place 1 applicator vaginally at bedtime., Disp: 45 g, Rfl: 0 .  tiZANidine (ZANAFLEX) 4 MG tablet, TAKE 1 TABLET BY MOUTH 2 TIMES A DAY AS NEEDED FOR MUSCLE SPASMS, Disp: 60 tablet, Rfl: 11 .  zolmitriptan (ZOMIG) 5 MG tablet, Take 1 tab at onset of migraine.  May repeat, with 1 tab, in 2 hrs, if needed.  Max dose of 2 tabs/24hrs.  Must last 30 days. #12/30, Disp: 12 tablet, Rfl: 11 .  zolpidem (AMBIEN) 10 MG tablet, TAKE 1 TABLET BY MOUTH EACH NIGHT AT BEDTIME AS NEEDED SLEEP, Disp: 30 tablet, Rfl: 2     Review of Systems  Constitutional: Negative for activity change, appetite change, chills, fever and unexpected weight change.  HENT: Negative for congestion, facial swelling, postnasal drip, rhinorrhea, sinus pressure, sneezing, sore throat and trouble swallowing.   Eyes: Negative for photophobia and visual disturbance.  Respiratory: Negative for apnea, cough, chest tightness, shortness of breath, wheezing and stridor.   Cardiovascular: Negative for chest pain and leg swelling.  Gastrointestinal: Negative for abdominal distention, anal bleeding, blood in stool, diarrhea, nausea and vomiting.  Genitourinary: Negative for difficulty urinating, dysuria, flank pain and hematuria.   Musculoskeletal: Positive for arthralgias and gait problem. Negative for joint swelling.  Skin: Negative for color change, pallor and wound.  Neurological: Positive for weakness and headaches. Negative for dizziness,  tremors and light-headedness.  Hematological: Negative for adenopathy. Does not bruise/bleed easily.  Psychiatric/Behavioral: Positive for dysphoric mood and sleep disturbance. Negative for agitation, behavioral problems, confusion, hallucinations, self-injury and suicidal ideas. The patient is nervous/anxious. The patient is not hyperactive.        Objective:   Physical Exam  Constitutional: She is oriented to person, place, and time. She appears well-developed and well-nourished. No distress.  HENT:  Head: Normocephalic and atraumatic.  Mouth/Throat: Oropharynx is clear and moist. No oropharyngeal exudate, posterior oropharyngeal edema, posterior oropharyngeal erythema or tonsillar abscesses.  Eyes: Pupils are equal, round, and reactive to light. Conjunctivae and EOM are normal. No scleral icterus.  Neck: Normal range of motion. Neck supple. No JVD present.  Cardiovascular: Normal rate and regular rhythm.  Pulmonary/Chest: Effort normal. No respiratory distress. She has no wheezes.  Abdominal: She exhibits no distension.  Musculoskeletal:        General: No tenderness or edema.  Lymphadenopathy:    She has no cervical adenopathy.  Neurological: She is alert and oriented to person, place, and time. She has normal reflexes. She exhibits normal muscle tone. Coordination normal.  Skin: Skin is warm and dry. She is not diaphoretic. No erythema. No pallor.  Psychiatric: Her speech is normal and behavior is normal. Judgment and thought content normal. Cognition and memory are normal. She exhibits a depressed mood.  Nursing note and vitals reviewed.         Assessment & Plan:  HIV: continue  SYMTUZA. RTC in 6 months   Migraines: followed closely by Neurology  Type 2  diabetes mellitus: followed by Dr Marisa Hua  Obesity: she continues to try to lose weight through diet  Hyperlipidemia:  On pravastatin and has had trouble tolerating other more potent drugs.  Course needs to be taken with drug interaction between statins and her cobicistat   Worsening disc disease: She is going to be seen by orthopedics but I would think also need to be seen by neurosurgery   Depression: This is fairly significant for her right now and she is being treated with different drugs under the guidance of internal medicine.  I think she could benefit from cognitive behavioral therapy but she is very much against seeing a psychiatrist perhaps a therapist who does CBT could help her if she would be open to this.  Vaginal discharge: We will try topical antifungals if these do not improve condition will initiate fluconazole potentially a higher dose though keeping in mind drug drug reaction cobicistat.  Also if she goes on fluconazole she will need to take herself off the statin while she is taking the fluconazole

## 2018-07-05 NOTE — Telephone Encounter (Signed)
Call to [facility] for PA for Diclofenac Gel 1%,  Approved x 30 days.  [place] [phone]. [id number].  [facility] was called and notified of.  Sander Nephew, RN 07/05/2018 12:15 PM.

## 2018-07-05 NOTE — Patient Instructions (Signed)
Try topical clotrimazole daily topical   If not responding to this we can try fluconazole potentially at higher dose with holding the statin  Your dulera, and proair have been sent in already per electronic records  You neeed a flu shot today

## 2018-07-09 DIAGNOSIS — M47816 Spondylosis without myelopathy or radiculopathy, lumbar region: Secondary | ICD-10-CM | POA: Diagnosis not present

## 2018-07-13 ENCOUNTER — Ambulatory Visit: Payer: Medicaid Other | Admitting: Gastroenterology

## 2018-07-26 ENCOUNTER — Other Ambulatory Visit: Payer: Self-pay | Admitting: Neurology

## 2018-07-26 ENCOUNTER — Other Ambulatory Visit: Payer: Self-pay | Admitting: Internal Medicine

## 2018-07-30 ENCOUNTER — Encounter: Payer: Self-pay | Admitting: *Deleted

## 2018-08-01 ENCOUNTER — Ambulatory Visit: Payer: Medicaid Other

## 2018-08-02 ENCOUNTER — Other Ambulatory Visit: Payer: Self-pay | Admitting: Internal Medicine

## 2018-08-02 DIAGNOSIS — G4700 Insomnia, unspecified: Secondary | ICD-10-CM

## 2018-08-02 NOTE — Telephone Encounter (Signed)
Next appt scheduled  3/26 with PCP.

## 2018-08-06 ENCOUNTER — Other Ambulatory Visit: Payer: Self-pay | Admitting: Orthopedic Surgery

## 2018-08-06 DIAGNOSIS — M47816 Spondylosis without myelopathy or radiculopathy, lumbar region: Secondary | ICD-10-CM | POA: Diagnosis not present

## 2018-08-06 DIAGNOSIS — M545 Low back pain, unspecified: Secondary | ICD-10-CM

## 2018-08-13 DIAGNOSIS — L299 Pruritus, unspecified: Secondary | ICD-10-CM | POA: Diagnosis not present

## 2018-08-13 DIAGNOSIS — R05 Cough: Secondary | ICD-10-CM | POA: Diagnosis not present

## 2018-08-13 DIAGNOSIS — J209 Acute bronchitis, unspecified: Secondary | ICD-10-CM | POA: Diagnosis not present

## 2018-08-13 DIAGNOSIS — J329 Chronic sinusitis, unspecified: Secondary | ICD-10-CM | POA: Diagnosis not present

## 2018-08-14 ENCOUNTER — Ambulatory Visit
Admission: RE | Admit: 2018-08-14 | Discharge: 2018-08-14 | Disposition: A | Discharge status: Home / Self Care | Payer: Medicaid Other | Source: Ambulatory Visit | Attending: Orthopedic Surgery | Admitting: Orthopedic Surgery

## 2018-08-14 DIAGNOSIS — M545 Low back pain, unspecified: Secondary | ICD-10-CM

## 2018-08-14 DIAGNOSIS — M48061 Spinal stenosis, lumbar region without neurogenic claudication: Secondary | ICD-10-CM | POA: Diagnosis not present

## 2018-08-17 ENCOUNTER — Encounter: Payer: Self-pay | Admitting: *Deleted

## 2018-08-17 ENCOUNTER — Ambulatory Visit: Payer: Medicaid Other | Admitting: Gastroenterology

## 2018-08-23 ENCOUNTER — Other Ambulatory Visit: Payer: Self-pay | Admitting: Infectious Disease

## 2018-08-23 ENCOUNTER — Other Ambulatory Visit: Payer: Self-pay | Admitting: Internal Medicine

## 2018-08-23 DIAGNOSIS — E118 Type 2 diabetes mellitus with unspecified complications: Secondary | ICD-10-CM

## 2018-08-23 NOTE — Telephone Encounter (Signed)
Next appt scheduled  4/2 with PCP. 

## 2018-08-28 ENCOUNTER — Telehealth: Payer: Self-pay | Admitting: *Deleted

## 2018-08-28 ENCOUNTER — Ambulatory Visit: Payer: Medicaid Other

## 2018-08-28 NOTE — Telephone Encounter (Signed)
PA for Ajovy approved by [facility] Medicaid.  KH#[id number] S.  TR#[id number].  Valid through 08/23/2019.  [facility] notified by fax.

## 2018-09-04 ENCOUNTER — Ambulatory Visit: Payer: Medicaid Other | Admitting: Gastroenterology

## 2018-09-06 ENCOUNTER — Encounter: Payer: Medicaid Other | Admitting: Internal Medicine

## 2018-09-10 DIAGNOSIS — M4316 Spondylolisthesis, lumbar region: Secondary | ICD-10-CM | POA: Diagnosis not present

## 2018-09-10 DIAGNOSIS — M5416 Radiculopathy, lumbar region: Secondary | ICD-10-CM | POA: Diagnosis not present

## 2018-09-10 DIAGNOSIS — M545 Low back pain: Secondary | ICD-10-CM | POA: Diagnosis not present

## 2018-09-13 ENCOUNTER — Telehealth: Payer: Self-pay | Admitting: Neurology

## 2018-09-13 NOTE — Telephone Encounter (Signed)
Started [id number] with [facility] [employer].

## 2018-09-17 DIAGNOSIS — N312 Flaccid neuropathic bladder, not elsewhere classified: Secondary | ICD-10-CM | POA: Diagnosis not present

## 2018-09-17 DIAGNOSIS — N761 Subacute and chronic vaginitis: Secondary | ICD-10-CM | POA: Diagnosis not present

## 2018-09-17 DIAGNOSIS — R81 Glycosuria: Secondary | ICD-10-CM | POA: Diagnosis not present

## 2018-09-19 ENCOUNTER — Other Ambulatory Visit: Payer: Self-pay | Admitting: Infectious Disease

## 2018-09-19 ENCOUNTER — Telehealth: Payer: Self-pay | Admitting: Neurology

## 2018-09-19 ENCOUNTER — Other Ambulatory Visit: Payer: Self-pay | Admitting: Neurology

## 2018-09-19 ENCOUNTER — Other Ambulatory Visit: Payer: Self-pay | Admitting: Internal Medicine

## 2018-09-19 DIAGNOSIS — S83232S Complex tear of medial meniscus, current injury, left knee, sequela: Secondary | ICD-10-CM

## 2018-09-19 DIAGNOSIS — I10 Essential (primary) hypertension: Secondary | ICD-10-CM

## 2018-09-19 NOTE — Telephone Encounter (Signed)
She would like to come for BOTOX injection, move up her appt to 11am

## 2018-09-20 ENCOUNTER — Ambulatory Visit: Payer: Medicaid Other | Admitting: Neurology

## 2018-09-20 ENCOUNTER — Other Ambulatory Visit: Payer: Self-pay

## 2018-09-20 ENCOUNTER — Telehealth: Payer: Self-pay | Admitting: *Deleted

## 2018-09-20 ENCOUNTER — Ambulatory Visit: Payer: Self-pay | Admitting: Neurology

## 2018-09-20 ENCOUNTER — Encounter: Payer: Self-pay | Admitting: Neurology

## 2018-09-20 VITALS — BP 125/76 | HR 101 | Ht 61.0 in | Wt 284.0 lb

## 2018-09-20 DIAGNOSIS — G43719 Chronic migraine without aura, intractable, without status migrainosus: Secondary | ICD-10-CM | POA: Diagnosis not present

## 2018-09-20 MED ORDER — DICLOFENAC POTASSIUM 50 MG PO TABS: ORAL_TABLET | ORAL | 5 refills | Status: DC

## 2018-09-20 MED ORDER — DICLOFENAC POTASSIUM(MIGRAINE) 50 MG PO PACK: 50.0000 mg | PACK | ORAL | 11 refills | Status: DC | PRN

## 2018-09-20 NOTE — Progress Notes (Signed)
**  Botox 100 units x 2 vials, NDC 2353-6144-31, Lot [id number], Exp 11/2020, specialty pharmacy.//mck,rn**

## 2018-09-20 NOTE — Telephone Encounter (Signed)
Diclofenac tablets required PA through [facility] [phone]).  PA approved through 09/20/2019.  VN#[id number].

## 2018-09-20 NOTE — Progress Notes (Signed)
PATIENT: [name] DOB: Aug 01, 1965  Chief Complaint  Patient presents with  . Migraine    Botox 100 units x 2 vials - specialty pharmacy     HISTORICAL  [name], Is accompanied by her sister returned for Botox injection as migraine prevention  She has past medical history of HIV, hyperlipidemia, migraines, depression, and non-Hodgkin lymphoma in 2005 with chemotherapy and radiation.   She has history of migraines since 19. In the last 10 years, she reports having daily migraines. She is currently taking Topamax and Zomig with relief. She takes approximately 20-25 tablets of her Zomig per month.    She has aura's that look like floating dirt particles, flashing lights, and she sees Christmas tree lights when they are severe, she has nausea, vomiting and sometimes diarrhea when they're severe. She also complains of diplopia, blurry vision, and  photophobia. She also has had a lightning bolt sensation throughout her body.  She describes them as pulsating and throbbing  on a scale of 5-6/10 which she considers mild, when they are severe they can go as high as 10/10. They usually start in the right temporal area, but will also start on the left temporal and are worse.   Any type of smoke or MSG can precipitate a migraine. They generally last 2-9 days. She has been to the emergency department twice in the last year for migraines. She states "I just can't do this anymore I need to do something about her migraines"  She has tried and failed different preventive medications:  atenolol, verapamil, nortriptyline, Inderal, Depakote without benefit. Atenolol and verapamil caused hypotension. Depakote caused severe vomiting. Topamax causing her worsening headaches, will show tried Zonegran, Topamax ER, she denies significant improvement.  For abortive treatment, she has tried Imitrex, Maxalt with suboptimal response, Zomig as needed since to work best,  she also takes transderm scophalamine  patch for nause  I started to give her BOTOX injection since May 2013, every 3 months, she responded very well,   Previous MRI brain was normal in 2013.  Last Botox injection as migraine prevention was March 2017, she responded very well, but in recent few weeks, she noticed increased migraine headaches, this baby had headache left retro-orbital area severe pounding headache with associated light noise sensitivity, has been ongoing for 3 days, failed to respond to Zomig by mouth, Imitrex injection as needed, she has constant dry heaves, difficulty opening her eyes, In addition, today she complains of restless leg symptoms, difficult to stand or lying still for extended period of time, has tried Neurontin-cause nausea, confusion,  Lyrica-cause excessive weight gain, she has never tried dopamine agonist in the past  I reviewed laboratory in April 2017, normal hemoglobin 12 point 9, mild elevated RDW 15.8, decreased MCV   Update April 27 2016  Last Botox injection was in June 2017, she had left knee arthroscopic surgery in August 2017, complaining of significant joints pain, unsteady gait, before Botox she was having moderate to severe headache daily, even Botox now, she used to 12 tablets of Zomig every months because of frequent moderate to severe headache  Update August 10 2016: Last Botox injection was in October 2017, has been very helpful, but over the past 3-4 weeks, she had frequent almost daily headaches, oftentimes wake her up from sleep, Zomig has been very helpful,  Low dose Mirapex 0.25 milligrams 2 tablets every night has been very helpful for her restless leg symptoms, she reported 80% improvement, laboratory  reviewed in January 2018, CD4 count 760, normal hemoglobin 12.3, mildly decreased MCV, increased RDW,  Update Nov 09 2016: She suffered bilateral ear infection, strep throat since last visit, had frequent headaches, nausea,  Update February 22 2017: She responded well to  previous injection, complains of a lot of stress at home  UPDATE May 31 2017: She complains suboptimal response to Botox injection, had daily headache over last week, also complains of a lot of stress at home, average more than 4 headaches in 1 week, lasting all day, with associated nausea vomiting, is taking Zomig as needed, with partial response  UPDATE September 08 2017: She complains of excessive stress, frequent migraine headaches,  UPDATE December 20 2017: She is accompanied by her sister at today's clinical visit, Ajovy injection has helped her migraine, she rated as 25%, but with the combination of Ajovy and Botox injection, she still has 3 migraine headaches each week, has been dealing with daily headaches over the past 1 week, required multiple dose of Zomig treatment  UPDATE Sept 25 2019: She responded well to previous injection, today she complains of 2 weeks history of severe headaches failed home remedy treatment, multiple dose of Zomig,  She is also getting Ajovy injection every month, which has been helpful,  UPDATE Jun 21 2018: she overall responding well to Botox, Ajovy, also taking amitriptyline 25 mg 2 tablets every night, Zomig as needed for abortive treatment, couple headaches each week, usually responding well to triptan treatment, but this particular headache been ongoing for 2 weeks, tried and failed multiple home medications.  UPDATE September 20 2018: She responded well to previous injection, but at the end of injection cycle,She would have more frequent prolonged headaches, she used Zomig as needed,  REVIEW OF SYSTEMS: Full 14 system review of systems performed and notable only for as above  ALLERGIES: Allergies  Allergen Reactions  . Diazepam     Triggers migraines  . Divalproex Sodium Nausea And Vomiting    Causes light-headedness  . Gabapentin Nausea And Vomiting    confusion  . Ondansetron Hcl Nausea And Vomiting    Triggers migraines, "makes me vomit"  .  Penicillins     REACTION: lips swollen to point of bleeding, sever vaginal irritation  Reports tolerating augmentin well without any complaints or reaction  . Pineapple   . Propoxyphene N-Acetaminophen Nausea And Vomiting  . Simvastatin     PATIENT CANNOT BE PRESCRIBED THIS STATIN WHILE RECEIVING NORVIR  . Topamax [Topiramate]     Migraines   . Tramadol Hcl Itching    REACTION: migraines and itching--hx of migraines even without medication   . Zocor [Simvastatin - High Dose]     HOME MEDICATIONS: Current Outpatient Medications  Medication Sig Dispense Refill  . AJOVY 225 MG/1.5ML SOSY INJECT '225MG'$  SUBCUTANEOUSLY ONCE EVERY 30 DAYS 1.5 mL 11  . albuterol (PROAIR HFA) 108 (90 Base) MCG/ACT inhaler USE 1 TO 2 PUFFS EVERY 4-6 HOURS AS NEEDED FOR SHORTNESS OF BREATH ORWHEEZING 8.5 g 1  . amitriptyline (ELAVIL) 25 MG tablet Take 1 tablet ('25mg'$ ) every night for 7 days then increase to 2 tablets ('50mg'$ ) every night 180 tablet 3  . Beclomethasone Dipropionate 80 MCG/ACT AERS Place 2 Inhalers into the nose daily. 8.7 g 10  . blood glucose meter kit and supplies Dispense based on patient and insurance preference. Use up to four times daily as directed. (FOR ICD-9 250.00, 250.01). 1 each 0  . BOTOX 100 units SOLR injection MD IS TO  INJECT 200 UNITS INTRAMUSCULARLY INTO HEAD AND NECK MUSCLES EVERY 3 MONTHS 2 each 3  . Calcium Carbonate-Vitamin D (CALCIUM-VITAMIN D) 500-200 MG-UNIT per tablet Take 2 tablets by mouth daily.     . dapagliflozin propanediol (FARXIGA) 10 MG TABS tablet Take 10 mg by mouth daily. 90 tablet 1  . diclofenac sodium (VOLTAREN) 1 % GEL APPLY TOPICALLY TO AFFECTED AREA 4 GRAMS4 TIMES DAILY 100 g 11  . DULERA 100-5 MCG/ACT AERO USE 2 PUFFS BY MOUTH THE FIRST THING IN THE MORNING AND THEN ANOTHER 2 PUFFS ABOUT 12 HOURS LATER 13 g 5  . fluconazole (DIFLUCAN) 100 MG tablet TAKE 1 TABLET BY MOUTH EVERY DAY 14 tablet 3  . fluocinonide (LIDEX) 0.05 % external solution 1 gtt AU qD prn  itching    . glucose blood test strip Use as instructed 100 each 12  . lisinopril (PRINIVIL,ZESTRIL) 10 MG tablet TAKE 1 TABLET BY MOUTH EVERY DAY FOR BLOOD PRESSURE 90 tablet 2  . lubiprostone (AMITIZA) 24 MCG capsule Take 1 capsule (24 mcg total) by mouth 2 (two) times daily with a meal. 180 capsule 1  . pantoprazole (PROTONIX) 40 MG tablet TAKE ONE TABLET BY MOUTH TWICE A DAY FORACID REFLUX 60 tablet 4  . pramipexole (MIRAPEX) 0.25 MG tablet Take 3 tablets (0.75 mg total) by mouth at bedtime. 90 tablet 11  . pravastatin (PRAVACHOL) 40 MG tablet TAKE 1 TABLET BY MOUTH EVERY DAY 90 tablet 1  . prochlorperazine (COMPAZINE) 10 MG tablet TAKE 1 TABLET BY MOUTH EVERY 6 HOURS AS NEEDED FOR NAUSEA & VOMITING 30 tablet 11  . scopolamine (TRANSDERM-SCOP, 1.5 MG,) 1 MG/3DAYS PLACE 1 PATCH ON SKIN EVERY 3 DAYS 10 patch 11  . sitaGLIPtin (JANUVIA) 100 MG tablet Take 1 tablet (100 mg total) by mouth daily. 90 tablet 1  . SYMTUZA 800-150-200-10 MG TABS TAKE 1 TABLET BY MOUTH EVERY MORNING WITH BREAKFAST 30 tablet 5  . Tamsulosin HCl (FLOMAX) 0.4 MG CAPS Take 0.4 mg by mouth daily. 1-2 daily    . terconazole (TERAZOL 7) 0.4 % vaginal cream Place 1 applicator vaginally at bedtime. 45 g 0  . tiZANidine (ZANAFLEX) 4 MG tablet TAKE 1 TABLET BY MOUTH 2 TIMES A DAY AS NEEDED FOR MUSCLE SPASMS 60 tablet 11  . zolmitriptan (ZOMIG) 5 MG tablet Take 1 tab at onset of migraine.  May repeat, with 1 tab, in 2 hrs, if needed.  Max dose of 2 tabs/24hrs.  Must last 30 days. #12/30 12 tablet 11  . zolpidem (AMBIEN) 10 MG tablet TAKE 1 TABLET BY MOUTH EACH NIGHT AT BEDTIME AS NEEDED FOR SLEEP 30 tablet 2   No current facility-administered medications for this visit.     PAST MEDICAL HISTORY: Past Medical History:  Diagnosis Date  . Acute gastritis without mention of hemorrhage 01/01/2002  . Anemia 10/2009   macrocytic anemia with baseline MCV 104-106  . Anxiety   . Asthma   . Boil 01/05/2017  . 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 Complex tear of medial meniscus of left knee as current injury 02/25/2016  . Complication of anesthesia    hard to wake up  . COPD (chronic obstructive pulmonary disease) (HCC)    chronic bronchitis  . Derangement of anterior horn of lateral meniscus of left knee 02/25/2016  . Duodenitis without mention of hemorrhage 01/01/2002  . DVT (deep venous thrombosis) (HCC)    history clot lt groin when she had lymphoma-in remision now.  . Elevated triglycerides  with high cholesterol 01/05/2017  . Gastroparesis   . GERD (gastroesophageal reflux disease)   . Hiatal hernia 01/01/2002  . HIV infection ([place])    CD4 = 570 (05/2010), VL undetectation  . Hyperlipidemia   . IBS (irritable bowel syndrome)   . Migraines    on topamax and triptans, frequent (almost daily) attacks   . Non Hodgkin's lymphoma ([place])    stage II, s/p resection, chemotherapy, radiotherapy, Dr. Beryle Beams is her oncologist.   . PUD (peptic ulcer disease) 07/1997   per EGD report 07/1997 with history of esophagitis  . Restless leg syndrome 12/24/2015  . Shingles    in lumbar dermatome  . Spondylolisthesis of lumbar region    L5-S1  . TMJ (dislocation of temporomandibular joint)   . Traumatic tear of lateral meniscus of right knee 04/20/2012  . Vaginal yeast infection 09/25/2017    PAST SURGICAL HISTORY: Past Surgical History:  Procedure Laterality Date  . CHOLECYSTECTOMY    . ENDOMETRIAL ABLATION    . KNEE ARTHROSCOPY Right 04/20/12  . KNEE ARTHROSCOPY WITH LATERAL MENISECTOMY Left 02/25/2016   Procedure: LEFT KNEE ARTHROSCOPY WITH MEDIAL AND  LATERAL MENISECTOMIES;  Surgeon: Marchia Bond, MD;  Location: [facility];  Service: Orthopedics;  Laterality: Left;  . KNEE ARTHROSCOPY WITH MEDIAL MENISECTOMY Left 02/25/2016   Procedure: KNEE ARTHROSCOPY WITH MEDIAL MENISECTOMY;  Surgeon: Marchia Bond, MD;  Location: [facility];  Service: Orthopedics;  Laterality: Left;  . TONSILLECTOMY     . TYMPANOPLASTY Left   . TYMPANOSTOMY TUBE PLACEMENT     as child-both  . UPPER GASTROINTESTINAL ENDOSCOPY  2003, 2007   done by Dr. Velora Heckler    FAMILY HISTORY: Family History  Problem Relation Age of Onset  . Hypertension Mother   . Stroke Mother   . Heart disease Mother   . Diabetes Mother   . Breast cancer Paternal Grandmother   . Kidney cancer Maternal Uncle   . Heart disease Maternal Uncle   . Diabetes Maternal Uncle   . Diabetes Maternal Grandmother   . Heart disease Maternal Grandmother   . Emphysema Paternal Uncle        never smoker  . Emphysema Paternal Grandfather        never smoker  . Colon cancer Paternal Aunt        with met to lung   . Non-Hodgkin's lymphoma Maternal Uncle   . Heart disease Maternal Grandfather     SOCIAL HISTORY:  Social History   Socioeconomic History  . Marital status: Divorced    Spouse name: Not on file  . Number of children: 0  . Years of education: 61  . Highest education level: Not on file  Occupational History  . Occupation: disabled    Fish farm manager: UNEMPLOYED  Social Needs  . Financial resource strain: Not on file  . Food insecurity:    Worry: Not on file    Inability: Not on file  . Transportation needs:    Medical: Not on file    Non-medical: Not on file  Tobacco Use  . Smoking status: Never Smoker  . Smokeless tobacco: Never Used  Substance and Sexual Activity  . Alcohol use: No    Alcohol/week: 0.0 standard drinks  . Drug use: No  . Sexual activity: Not Currently    Partners: Male    Birth control/protection: Surgical  Lifestyle  . Physical activity:    Days per week: Not on file    Minutes per session: Not  on file  . Stress: Not on file  Relationships  . Social connections:    Talks on phone: Not on file    Gets together: Not on file    Attends religious service: Not on file    Active member of club or organization: Not on file    Attends meetings of clubs or organizations: Not on file     Relationship status: Not on file  . Intimate partner violence:    Fear of current or ex partner: Not on file    Emotionally abused: Not on file    Physically abused: Not on file    Forced sexual activity: Not on file  Other Topics Concern  . Not on file  Social History Narrative      Patient is disabled.   Right handed.   Caffeine - Diet soda., tea   Education - high school     PHYSICAL EXAM   Vitals:   09/20/18 1045  BP: 125/76  Pulse: (!) 101  Weight: 284 lb (128.8 kg)  Height: '5\' 1"'$  (1.549 m)    Not recorded      Body mass index is 53.66 kg/m.  PHYSICAL EXAMNIATION:  Gen: NAD, conversant, well nourised, obese, well groomed                     Cardiovascular: Regular rate rhythm, no peripheral edema, warm, nontender. Eyes: Conjunctivae clear without exudates or hemorrhage Neck: Supple, no carotid bruise. Pulmonary: Clear to auscultation bilaterally   NEUROLOGICAL EXAM:  MENTAL STATUS: Speech:    Speech is normal; fluent and spontaneous with normal comprehension.  Cognition:     Orientation to time, place and person     Normal recent and remote memory     Normal Attention span and concentration     Normal Language, naming, repeating,spontaneous speech     Fund of knowledge   CRANIAL NERVES: CN II: Visual fields are full to confrontation. Pupils are round equal and briskly reactive to light. CN III, IV, VI: extraocular movement are normal. No ptosis. CN V: Facial sensation is intact to pinprick in all 3 divisions bilaterally. Corneal responses are intact.  CN VII: Face is symmetric with normal eye closure and smile. CN VIII: Hearing is normal to rubbing fingers CN IX, X: Palate elevates symmetrically. Phonation is normal. CN XI: Head turning and shoulder shrug are intact CN XII: Tongue is midline with normal movements and no atrophy.  MOTOR: There is no pronator drift of out-stretched arms. Muscle bulk and tone are normal. Muscle strength is normal.   REFLEXES: Reflexes are 2+ and symmetric at the biceps, triceps, knees, and ankles. Plantar responses are flexor.  SENSORY: Intact to light touch, pinprick, positional sensation and vibratory sensation are intact in fingers and toes.  COORDINATION: Rapid alternating movements and fine finger movements are intact. There is no dysmetria on finger-to-nose and heel-knee-shin.    GAIT/STANCE: Posture is normal. Gait is steady with normal steps, base, arm swing, and turning. Heel and toe walking are normal. Tandem gait is normal.  Romberg is absent.  DIAGNOSTIC DATA (LABS, IMAGING, TESTING) - I reviewed patient records, labs, notes, testing and imaging myself where available.   ASSESSMENT AND PLAN  MEELA WAREING is a 53 y.o. female    History of HIV, hypercholesterolemia, Non hodgkin' lymphoma with chemotherapy and radiation and migraines since age 46.  Chronic migraine headaches She has tried and failed multiple preventive medications including atenolol, verapamil, nortriptyline, and Depakote,Topamax,  Topamax ER, zonegran could not tolerate Cardizem,  She has migraines daily for more than  10 years. She is currently taking amitriptyline 25 mg 2 tablets every day and Zomig with some relief. Previously tried imitrex and Maxalt with suboptimal response,  Normal MRI brain, and neurological exam.  BOTOX injection was performed according to protocol by [employer]. 100 units of BOTOX was dissolved into 2 cc NS  Used total 200 units  BOTOX injection was performed according to protocol by [employer]. 100 units of BOTOX was dissolved into 2 cc NS.      25 units was injected into bilateral temporal parietal region  10 units were injected into bilateral masseters    Iv Depacon infusion today, also add on compazine '10mg'$  and toradol '30mg'$  as needed.  continue AJOVY monthly injection Zomig, Cambia as needed, may add on Compazine '10mg'$ , tizanidine 4 mg as needed for prolonged severe headaches,   Restless leg symptoms   Continue Mirapex 0.2 5 mg,  3 tablets every night  Ferritin 51 in Feb 2018,     Marcial Pacas, M.D. Ph.D.  [facility] [address] [place], [place] [address] Ph: [phone] Fax: [phone]  CC: Referring Provider

## 2018-09-20 NOTE — Telephone Encounter (Signed)
Received notification from pharmacy that [name] is backordered indefinitely.  They are requesting a change to diclofenac potassium tablets.  Per by Dr. Krista Blue, ok to provide new rx for diclofenac potassium 50mg , 1-2 tabs per day for migraine.  Max: 2 tabs/day, 12 tabs/month.  #12 x 5 provided with instructions to void once Cambia is back in stock.Marland Kitchen  Pt aware and agreeable to this change.

## 2018-09-26 ENCOUNTER — Ambulatory Visit: Payer: Medicaid Other

## 2018-09-26 ENCOUNTER — Telehealth: Payer: Self-pay | Admitting: Neurology

## 2018-09-26 NOTE — Telephone Encounter (Signed)
The PA was approved on 09/20/2018 for diclofenac (same day it was prescribed).  I called [facility] and spoke to [facility] who informed me the med is ready for pick up.  I called the patient back and provided her with this update.

## 2018-09-26 NOTE — Telephone Encounter (Signed)
Pt states that the new medication that she was put on last week is needing a PA and sent to [facility]. Please advise.

## 2018-09-27 ENCOUNTER — Encounter: Payer: Medicaid Other | Admitting: Internal Medicine

## 2018-09-27 ENCOUNTER — Ambulatory Visit (INDEPENDENT_AMBULATORY_CARE_PROVIDER_SITE_OTHER): Payer: Medicaid Other | Admitting: Internal Medicine

## 2018-09-27 ENCOUNTER — Other Ambulatory Visit: Payer: Self-pay

## 2018-09-27 DIAGNOSIS — E119 Type 2 diabetes mellitus without complications: Secondary | ICD-10-CM

## 2018-09-27 DIAGNOSIS — I10 Essential (primary) hypertension: Secondary | ICD-10-CM | POA: Diagnosis not present

## 2018-09-27 DIAGNOSIS — G4700 Insomnia, unspecified: Secondary | ICD-10-CM | POA: Diagnosis not present

## 2018-09-27 DIAGNOSIS — F3341 Major depressive disorder, recurrent, in partial remission: Secondary | ICD-10-CM

## 2018-09-27 DIAGNOSIS — B3731 Acute candidiasis of vulva and vagina: Secondary | ICD-10-CM

## 2018-09-27 DIAGNOSIS — K5900 Constipation, unspecified: Secondary | ICD-10-CM | POA: Diagnosis not present

## 2018-09-27 DIAGNOSIS — B373 Candidiasis of vulva and vagina: Secondary | ICD-10-CM

## 2018-09-27 MED ORDER — DULOXETINE HCL 60 MG PO CPEP: 60.0000 mg | ORAL_CAPSULE | Freq: Every day | ORAL | 2 refills | Status: DC

## 2018-09-27 MED ORDER — ZOLPIDEM TARTRATE 10 MG PO TABS: ORAL_TABLET | ORAL | 2 refills | Status: DC

## 2018-09-27 MED ORDER — LACTULOSE 20 G PO PACK: 20.0000 g | PACK | Freq: Two times a day (BID) | ORAL | 2 refills | Status: DC

## 2018-09-27 MED ORDER — TERCONAZOLE 0.4 % VA CREA: 1.0000 | TOPICAL_CREAM | Freq: Every day | VAGINAL | 0 refills | Status: DC

## 2018-09-27 NOTE — Assessment & Plan Note (Signed)
HPI: - Currently on lisinopril 10 mg QD  - No issues affording the medication. No apparent side effects.  - Last BMP in December with normal renal function and potassium   A/P: - Continue Lisinopril 10 mg QD

## 2018-09-27 NOTE — Assessment & Plan Note (Signed)
HPI: - Difficult to treat insomnia. Problem with both initiation and maintenance. - Currently being managed with Ambien, understands the risks and benefits of the medications  - Continue to get 4-5 hours of sleep per night   A/P: - Continue Ambien

## 2018-09-27 NOTE — Assessment & Plan Note (Signed)
>>  ASSESSMENT AND PLAN FOR TYPE 2 DIABETES MELLITUS (HCC) WRITTEN ON 09/27/2018  3:36 PM BY HELBERG, JUSTIN, MD  HPI: - Last A1c 6.9, currently on Farxiga and Januvia - CBG in the 180s over the past 1 week.  - Losing weight, has lost a lot of weight.  - Avoiding sugary drinks but does occasionally have one.  - Trying to due better with her diet but it is highly variable.  - She has not been active lately due to her back pain and sinus infections.  A/P: - Continue  Farxiga and Januvia - Recheck A1c next visit

## 2018-09-27 NOTE — Assessment & Plan Note (Signed)
HPI: - Chronic constipation  - Multiple medications without relief - Referred to GI. Appointment schedule for May  A/P: - Will try lactulose

## 2018-09-27 NOTE — Progress Notes (Signed)
   CC: F/u DM, hypertension, constipation, insomnia, and yeast infection  HPI:  This is a telephone encounter between [name] and [name] on 09/27/2018 for f/u DM, hypertension, constipation, insomnia, and yeast infection. The visit was conducted with the patient located at home and [facility] at [facility]. The patient's identity was confirmed using their DOB and current address. The patient has consented to being evaluated through a telephone encounter and understands the associated risks (an examination cannot be done and the patient may need to come in for an appointment) / benefits (allows the patient to remain at home, decreasing exposure to coronavirus). I personally spent 24 minutes on medical discussion.   Past Medical History:  Diagnosis Date  . Acute gastritis without mention of hemorrhage 01/01/2002  . Anemia 10/2009   macrocytic anemia with baseline MCV 104-106  . Anxiety   . Asthma   . Boil 01/05/2017  . 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 Complex tear of medial meniscus of left knee as current injury 02/25/2016  . Complication of anesthesia    hard to wake up  . COPD (chronic obstructive pulmonary disease) (HCC)    chronic bronchitis  . Derangement of anterior horn of lateral meniscus of left knee 02/25/2016  . Duodenitis without mention of hemorrhage 01/01/2002  . DVT (deep venous thrombosis) (HCC)    history clot lt groin when she had lymphoma-in remision now.  . Elevated triglycerides with high cholesterol 01/05/2017  . Gastroparesis   . GERD (gastroesophageal reflux disease)   . Hiatal hernia 01/01/2002  . HIV infection ([place])    CD4 = 570 (05/2010), VL undetectation  . Hyperlipidemia   . IBS (irritable bowel syndrome)   . Migraines    on topamax and triptans, frequent (almost daily) attacks   . Non Hodgkin's lymphoma ([place])    stage II, s/p resection, chemotherapy, radiotherapy, Dr. Beryle Beams is her oncologist.   . PUD (peptic ulcer disease) 07/1997   per  EGD report 07/1997 with history of esophagitis  . Restless leg syndrome 12/24/2015  . Shingles    in lumbar dermatome  . Spondylolisthesis of lumbar region    L5-S1  . TMJ (dislocation of temporomandibular joint)   . Traumatic tear of lateral meniscus of right knee 04/20/2012  . Vaginal yeast infection 09/25/2017   Review of Systems:  Performed and all others negative.  Assessment & Plan:   See Encounters Tab for problem based charting.  Patient discussed with Dr. Beryle Beams

## 2018-09-27 NOTE — Patient Instructions (Signed)
Discussed with patient COVID precautions and if he begins to experience any symptoms we have a telephone service he can call. She voices understanding. All questions and concerns addressed.

## 2018-09-27 NOTE — Assessment & Plan Note (Signed)
HPI: - Seen in 06/2018 and diagnosed with MDD, at that time started on Amtriptyline to treat depression, neuropathic pain, and migraines.  - Unfortunately depression is only minimally improved.  - Would like to continue the Amtriptyline but add duloxetine  - No SI or HI  A/P: - Continue Amtriptyline 25 mg QHS - Add duloxetine 60 mg QD

## 2018-09-27 NOTE — Assessment & Plan Note (Signed)
HPI: - Last A1c 6.9, currently on Farxiga and Januvia - CBG in the 180s over the past 1 week.  - Losing weight, has lost a lot of weight.  - Avoiding sugary drinks but does occasionally have one.  - Trying to due better with her diet but it is highly variable.  - She has not been active lately due to her back pain and sinus infections.  A/P: - Continue  Farxiga and Januvia - Recheck A1c next visit

## 2018-09-27 NOTE — Assessment & Plan Note (Signed)
HPI: - Vaginal irritation, itching, and discharge after completing abx for sinus infection  - She has frequent yeast infection and last cultures grew a resistant strain of Canda  A/P: - Refill for terazol sent. Judicious use due to prior cultures.

## 2018-09-28 NOTE — Progress Notes (Signed)
Medicine attending: Medical history and presenting problems reviewed with resident physician Dr Ina Homes on the day of the patient telephone encounter and I concur with his evaluation and management plan.

## 2018-10-01 ENCOUNTER — Other Ambulatory Visit: Payer: Self-pay | Admitting: Neurology

## 2018-10-04 ENCOUNTER — Encounter: Payer: Medicaid Other | Admitting: Internal Medicine

## 2018-10-12 ENCOUNTER — Other Ambulatory Visit: Payer: Self-pay | Admitting: Obstetrics and Gynecology

## 2018-10-15 ENCOUNTER — Telehealth: Payer: Self-pay | Admitting: *Deleted

## 2018-10-15 NOTE — Telephone Encounter (Signed)
I called [facility] and Cambia is still on backorder right now.  The PA has been approved by [facility] [phone]) through 10/15/2018.  CC#[id number] W. Pt YX#[id number] S.  They will fill it once it comes back in stock.  She has a prescription for diclofenac tablets to use while waiting on Cambia to be available. The pharmacy has instructions to void diclofenac tablet refills when Cambia is able to be filled again.

## 2018-10-16 ENCOUNTER — Other Ambulatory Visit: Payer: Self-pay | Admitting: Internal Medicine

## 2018-10-16 DIAGNOSIS — B3731 Acute candidiasis of vulva and vagina: Secondary | ICD-10-CM

## 2018-10-16 DIAGNOSIS — B373 Candidiasis of vulva and vagina: Secondary | ICD-10-CM

## 2018-10-19 ENCOUNTER — Ambulatory Visit: Payer: Medicaid Other | Admitting: Gastroenterology

## 2018-10-24 ENCOUNTER — Ambulatory Visit: Payer: Medicaid Other

## 2018-10-31 ENCOUNTER — Ambulatory Visit: Payer: Medicaid Other

## 2018-11-14 ENCOUNTER — Other Ambulatory Visit: Payer: Self-pay | Admitting: Internal Medicine

## 2018-11-14 DIAGNOSIS — G4700 Insomnia, unspecified: Secondary | ICD-10-CM

## 2018-11-20 NOTE — Addendum Note (Signed)
Addended by: Hulan Fray on: 11/20/2018 04:58 PM   Modules accepted: Orders

## 2018-11-23 DIAGNOSIS — G894 Chronic pain syndrome: Secondary | ICD-10-CM | POA: Diagnosis not present

## 2018-11-23 DIAGNOSIS — M47816 Spondylosis without myelopathy or radiculopathy, lumbar region: Secondary | ICD-10-CM | POA: Diagnosis not present

## 2018-11-23 DIAGNOSIS — G43709 Chronic migraine without aura, not intractable, without status migrainosus: Secondary | ICD-10-CM | POA: Diagnosis not present

## 2018-12-12 ENCOUNTER — Ambulatory Visit: Payer: Medicaid Other

## 2018-12-13 DIAGNOSIS — M47816 Spondylosis without myelopathy or radiculopathy, lumbar region: Secondary | ICD-10-CM | POA: Diagnosis not present

## 2018-12-14 ENCOUNTER — Telehealth: Payer: Self-pay | Admitting: *Deleted

## 2018-12-14 DIAGNOSIS — K5900 Constipation, unspecified: Secondary | ICD-10-CM

## 2018-12-14 NOTE — Telephone Encounter (Signed)
Received fax from [facility] stating lactulose packets are not available. Requesting 10 g/40mL solution Take 30 mL (20g total) by mouth twice daily. Hubbard Hartshorn, RN, BSN

## 2018-12-19 ENCOUNTER — Other Ambulatory Visit: Payer: Self-pay

## 2018-12-19 ENCOUNTER — Ambulatory Visit (INDEPENDENT_AMBULATORY_CARE_PROVIDER_SITE_OTHER): Payer: Medicaid Other | Admitting: Neurology

## 2018-12-19 ENCOUNTER — Encounter: Payer: Self-pay | Admitting: Neurology

## 2018-12-19 ENCOUNTER — Ambulatory Visit: Payer: Self-pay | Admitting: Neurology

## 2018-12-19 VITALS — BP 126/86 | HR 92 | Temp 97.1°F | Ht 61.0 in | Wt 292.0 lb

## 2018-12-19 DIAGNOSIS — G43719 Chronic migraine without aura, intractable, without status migrainosus: Secondary | ICD-10-CM | POA: Diagnosis not present

## 2018-12-19 MED ORDER — ZOLMITRIPTAN 5 MG PO TABS: ORAL_TABLET | ORAL | 11 refills | Status: DC

## 2018-12-19 MED ORDER — SCOPOLAMINE 1 MG/3DAYS TD PT72: MEDICATED_PATCH | TRANSDERMAL | 11 refills | Status: DC

## 2018-12-19 MED ORDER — LACTULOSE 10 GM/15ML PO SOLN: 20.0000 g | Freq: Two times a day (BID) | ORAL | 6 refills | Status: DC

## 2018-12-19 NOTE — Progress Notes (Signed)
PATIENT: [name] DOB: Aug 01, 1965  Chief Complaint  Patient presents with  . Migraine    Botox 100 units x 2 vials - specialty pharmacy     HISTORICAL  [name], Is accompanied by her sister returned for Botox injection as migraine prevention  She has past medical history of HIV, hyperlipidemia, migraines, depression, and non-Hodgkin lymphoma in 2005 with chemotherapy and radiation.   She has history of migraines since 19. In the last 10 years, she reports having daily migraines. She is currently taking Topamax and Zomig with relief. She takes approximately 20-25 tablets of her Zomig per month.    She has aura's that look like floating dirt particles, flashing lights, and she sees Christmas tree lights when they are severe, she has nausea, vomiting and sometimes diarrhea when they're severe. She also complains of diplopia, blurry vision, and  photophobia. She also has had a lightning bolt sensation throughout her body.  She describes them as pulsating and throbbing  on a scale of 5-6/10 which she considers mild, when they are severe they can go as high as 10/10. They usually start in the right temporal area, but will also start on the left temporal and are worse.   Any type of smoke or MSG can precipitate a migraine. They generally last 2-9 days. She has been to the emergency department twice in the last year for migraines. She states "I just can't do this anymore I need to do something about her migraines"  She has tried and failed different preventive medications:  atenolol, verapamil, nortriptyline, Inderal, Depakote without benefit. Atenolol and verapamil caused hypotension. Depakote caused severe vomiting. Topamax causing her worsening headaches, will show tried Zonegran, Topamax ER, she denies significant improvement.  For abortive treatment, she has tried Imitrex, Maxalt with suboptimal response, Zomig as needed since to work best,  she also takes transderm scophalamine  patch for nause  I started to give her BOTOX injection since May 2013, every 3 months, she responded very well,   Previous MRI brain was normal in 2013.  Last Botox injection as migraine prevention was March 2017, she responded very well, but in recent few weeks, she noticed increased migraine headaches, this baby had headache left retro-orbital area severe pounding headache with associated light noise sensitivity, has been ongoing for 3 days, failed to respond to Zomig by mouth, Imitrex injection as needed, she has constant dry heaves, difficulty opening her eyes, In addition, today she complains of restless leg symptoms, difficult to stand or lying still for extended period of time, has tried Neurontin-cause nausea, confusion,  Lyrica-cause excessive weight gain, she has never tried dopamine agonist in the past  I reviewed laboratory in April 2017, normal hemoglobin 12 point 9, mild elevated RDW 15.8, decreased MCV   Update April 27 2016  Last Botox injection was in June 2017, she had left knee arthroscopic surgery in August 2017, complaining of significant joints pain, unsteady gait, before Botox she was having moderate to severe headache daily, even Botox now, she used to 12 tablets of Zomig every months because of frequent moderate to severe headache  Update August 10 2016: Last Botox injection was in October 2017, has been very helpful, but over the past 3-4 weeks, she had frequent almost daily headaches, oftentimes wake her up from sleep, Zomig has been very helpful,  Low dose Mirapex 0.25 milligrams 2 tablets every night has been very helpful for her restless leg symptoms, she reported 80% improvement, laboratory  reviewed in January 2018, CD4 count 760, normal hemoglobin 12.3, mildly decreased MCV, increased RDW,  Update Nov 09 2016: She suffered bilateral ear infection, strep throat since last visit, had frequent headaches, nausea,  Update February 22 2017: She responded well to  previous injection, complains of a lot of stress at home  UPDATE May 31 2017: She complains suboptimal response to Botox injection, had daily headache over last week, also complains of a lot of stress at home, average more than 4 headaches in 1 week, lasting all day, with associated nausea vomiting, is taking Zomig as needed, with partial response  UPDATE September 08 2017: She complains of excessive stress, frequent migraine headaches,  UPDATE December 20 2017: She is accompanied by her sister at today's clinical visit, Ajovy injection has helped her migraine, she rated as 25%, but with the combination of Ajovy and Botox injection, she still has 3 migraine headaches each week, has been dealing with daily headaches over the past 1 week, required multiple dose of Zomig treatment  UPDATE Sept 25 2019: She responded well to previous injection, today she complains of 2 weeks history of severe headaches failed home remedy treatment, multiple dose of Zomig,  She is also getting Ajovy injection every month, which has been helpful,  UPDATE Jun 21 2018: she overall responding well to Botox, Ajovy, also taking amitriptyline 25 mg 2 tablets every night, Zomig as needed for abortive treatment, couple headaches each week, usually responding well to triptan treatment, but this particular headache been ongoing for 2 weeks, tried and failed multiple home medications.  UPDATE September 20 2018: She responded well to previous injection, but at the end of injection cycle,She would have more frequent prolonged headaches, she used Zomig as needed,  UPDATE December 19 2018: She responded very well to previous injection.  REVIEW OF SYSTEMS: Full 14 system review of systems performed and notable only for as above  ALLERGIES: Allergies  Allergen Reactions  . Diazepam     Triggers migraines  . Divalproex Sodium Nausea And Vomiting    Causes light-headedness  . Gabapentin Nausea And Vomiting    confusion  . Ondansetron Hcl  Nausea And Vomiting    Triggers migraines, "makes me vomit"  . Penicillins     REACTION: lips swollen to point of bleeding, sever vaginal irritation  Reports tolerating augmentin well without any complaints or reaction  . Pineapple   . Propoxyphene N-Acetaminophen Nausea And Vomiting  . Simvastatin     PATIENT CANNOT BE PRESCRIBED THIS STATIN WHILE RECEIVING NORVIR  . Topamax [Topiramate]     Migraines   . Tramadol Hcl Itching    REACTION: migraines and itching--hx of migraines even without medication   . Zocor [Simvastatin - High Dose]     HOME MEDICATIONS: Current Outpatient Medications  Medication Sig Dispense Refill  . AJOVY 225 MG/1.5ML SOSY INJECT 225MG SUBCUTANEOUSLY ONCE EVERY 30 DAYS 1.5 mL 11  . albuterol (PROAIR HFA) 108 (90 Base) MCG/ACT inhaler USE 1 TO 2 PUFFS EVERY 4-6 HOURS AS NEEDED FOR SHORTNESS OF BREATH ORWHEEZING 8.5 g 1  . amitriptyline (ELAVIL) 25 MG tablet Take 1 tablet (95m) every night for 7 days then increase to 2 tablets (58m every night 180 tablet 3  . Beclomethasone Dipropionate 80 MCG/ACT AERS Place 2 Inhalers into the nose daily. 8.7 g 10  . blood glucose meter kit and supplies Dispense based on patient and insurance preference. Use up to four times daily as directed. (FOR ICD-9 250.00, 250.01). 1  each 0  . BOTOX 100 units SOLR injection FOR OFFICE ADMINISTRATION. PHYSICIAN TO INJECT UP TO 200 UNITS INTRAMUSCULARLY INTO HEAD, FACE AND NECK AREA EVERY 90 DAYS. 2 each 2  . Calcium Carbonate-Vitamin D (CALCIUM-VITAMIN D) 500-200 MG-UNIT per tablet Take 2 tablets by mouth daily.     . dapagliflozin propanediol (FARXIGA) 10 MG TABS tablet Take 10 mg by mouth daily. 90 tablet 1  . diclofenac (CATAFLAM) 50 MG tablet Take 1-2 tabs daily for migraine.  Max: 2 tabs/day or 12 tabs/month 12 tablet 5  . Diclofenac Potassium (CAMBIA) 50 MG PACK Take 50 mg by mouth as needed. 12 each 11  . diclofenac sodium (VOLTAREN) 1 % GEL APPLY TOPICALLY TO AFFECTED AREA 4  GRAMS4 TIMES DAILY 100 g 11  . DULERA 100-5 MCG/ACT AERO USE 2 PUFFS BY MOUTH THE FIRST THING IN THE MORNING AND THEN ANOTHER 2 PUFFS ABOUT 12 HOURS LATER 13 g 5  . DULoxetine (CYMBALTA) 60 MG capsule Take 1 capsule (60 mg total) by mouth daily. 90 capsule 2  . fluconazole (DIFLUCAN) 100 MG tablet TAKE 1 TABLET BY MOUTH EVERY DAY 14 tablet 3  . fluocinonide (LIDEX) 0.05 % external solution 1 gtt AU qD prn itching    . glucose blood test strip Use as instructed 100 each 12  . lactulose (CHRONULAC) 10 GM/15ML solution Take 30 mLs (20 g total) by mouth 2 (two) times daily. 1892 mL 6  . lisinopril (PRINIVIL,ZESTRIL) 10 MG tablet TAKE 1 TABLET BY MOUTH EVERY DAY FOR BLOOD PRESSURE 90 tablet 2  . pantoprazole (PROTONIX) 40 MG tablet TAKE ONE TABLET BY MOUTH TWICE A DAY FORACID REFLUX 60 tablet 4  . pramipexole (MIRAPEX) 0.25 MG tablet Take 3 tablets (0.75 mg total) by mouth at bedtime. 90 tablet 11  . pravastatin (PRAVACHOL) 40 MG tablet TAKE 1 TABLET BY MOUTH EVERY DAY 90 tablet 1  . prochlorperazine (COMPAZINE) 10 MG tablet TAKE 1 TABLET BY MOUTH EVERY 6 HOURS AS NEEDED FOR NAUSEA & VOMITING 30 tablet 11  . scopolamine (TRANSDERM-SCOP, 1.5 MG,) 1 MG/3DAYS PLACE 1 PATCH ON SKIN EVERY 3 DAYS 10 patch 11  . sitaGLIPtin (JANUVIA) 100 MG tablet Take 1 tablet (100 mg total) by mouth daily. 90 tablet 1  . SYMTUZA 800-150-200-10 MG TABS TAKE 1 TABLET BY MOUTH EVERY MORNING WITH BREAKFAST 30 tablet 5  . Tamsulosin HCl (FLOMAX) 0.4 MG CAPS Take 0.4 mg by mouth daily. 1-2 daily    . terconazole (TERAZOL 7) 0.4 % vaginal cream APPLY 1 APPLICATORFUL VAGINALLY EACH NIGHT AT BEDTIME 45 g 0  . tiZANidine (ZANAFLEX) 4 MG tablet TAKE 1 TABLET BY MOUTH 2 TIMES A DAY AS NEEDED FOR MUSCLE SPASMS 60 tablet 11  . zolmitriptan (ZOMIG) 5 MG tablet Take 1 tab at onset of migraine.  May repeat, with 1 tab, in 2 hrs, if needed.  Max dose of 2 tabs/24hrs.  Must last 30 days. #12/30 12 tablet 11  . zolpidem (AMBIEN) 10 MG tablet  TAKE 1 TABLET BY MOUTH EACH NIGHT AT BEDTIME AS NEEDED FOR SLEEP 30 tablet 2   No current facility-administered medications for this visit.     PAST MEDICAL HISTORY: Past Medical History:  Diagnosis Date  . Acute gastritis without mention of hemorrhage 01/01/2002  . Anemia 10/2009   macrocytic anemia with baseline MCV 104-106  . Anxiety   . Asthma   . Boil 01/05/2017  . 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 Complex tear of medial meniscus of  left knee as current injury 02/25/2016  . Complication of anesthesia    hard to wake up  . COPD (chronic obstructive pulmonary disease) (HCC)    chronic bronchitis  . Derangement of anterior horn of lateral meniscus of left knee 02/25/2016  . Duodenitis without mention of hemorrhage 01/01/2002  . DVT (deep venous thrombosis) (HCC)    history clot lt groin when she had lymphoma-in remision now.  . Elevated triglycerides with high cholesterol 01/05/2017  . Gastroparesis   . GERD (gastroesophageal reflux disease)   . Hiatal hernia 01/01/2002  . HIV infection ([place])    CD4 = 570 (05/2010), VL undetectation  . Hyperlipidemia   . IBS (irritable bowel syndrome)   . Migraines    on topamax and triptans, frequent (almost daily) attacks   . Non Hodgkin's lymphoma ([place])    stage II, s/p resection, chemotherapy, radiotherapy, Dr. Beryle Beams is her oncologist.   . PUD (peptic ulcer disease) 07/1997   per EGD report 07/1997 with history of esophagitis  . Restless leg syndrome 12/24/2015  . Shingles    in lumbar dermatome  . Spondylolisthesis of lumbar region    L5-S1  . TMJ (dislocation of temporomandibular joint)   . Traumatic tear of lateral meniscus of right knee 04/20/2012  . Vaginal yeast infection 09/25/2017    PAST SURGICAL HISTORY: Past Surgical History:  Procedure Laterality Date  . CHOLECYSTECTOMY    . ENDOMETRIAL ABLATION    . KNEE ARTHROSCOPY Right 04/20/12  . KNEE ARTHROSCOPY WITH LATERAL MENISECTOMY Left 02/25/2016   Procedure: LEFT  KNEE ARTHROSCOPY WITH MEDIAL AND  LATERAL MENISECTOMIES;  Surgeon: Marchia Bond, MD;  Location: [facility];  Service: Orthopedics;  Laterality: Left;  . KNEE ARTHROSCOPY WITH MEDIAL MENISECTOMY Left 02/25/2016   Procedure: KNEE ARTHROSCOPY WITH MEDIAL MENISECTOMY;  Surgeon: Marchia Bond, MD;  Location: [facility];  Service: Orthopedics;  Laterality: Left;  . TONSILLECTOMY    . TYMPANOPLASTY Left   . TYMPANOSTOMY TUBE PLACEMENT     as child-both  . UPPER GASTROINTESTINAL ENDOSCOPY  2003, 2007   done by Dr. Velora Heckler    FAMILY HISTORY: Family History  Problem Relation Age of Onset  . Hypertension Mother   . Stroke Mother   . Heart disease Mother   . Diabetes Mother   . Breast cancer Paternal Grandmother   . Kidney cancer Maternal Uncle   . Heart disease Maternal Uncle   . Diabetes Maternal Uncle   . Diabetes Maternal Grandmother   . Heart disease Maternal Grandmother   . Emphysema Paternal Uncle        never smoker  . Emphysema Paternal Grandfather        never smoker  . Colon cancer Paternal Aunt        with met to lung   . Non-Hodgkin's lymphoma Maternal Uncle   . Heart disease Maternal Grandfather     SOCIAL HISTORY:  Social History   Socioeconomic History  . Marital status: Divorced    Spouse name: Not on file  . Number of children: 0  . Years of education: 67  . Highest education level: Not on file  Occupational History  . Occupation: disabled    Fish farm manager: UNEMPLOYED  Social Needs  . Financial resource strain: Not on file  . Food insecurity    Worry: Not on file    Inability: Not on file  . Transportation needs    Medical: Not on file    Non-medical: Not on file  Tobacco Use  . Smoking status: Never Smoker  . Smokeless tobacco: Never Used  Substance and Sexual Activity  . Alcohol use: No    Alcohol/week: 0.0 standard drinks  . Drug use: No  . Sexual activity: Not Currently    Partners: Male    Birth control/protection:  Surgical  Lifestyle  . Physical activity    Days per week: Not on file    Minutes per session: Not on file  . Stress: Not on file  Relationships  . Social Herbalist on phone: Not on file    Gets together: Not on file    Attends religious service: Not on file    Active member of club or organization: Not on file    Attends meetings of clubs or organizations: Not on file    Relationship status: Not on file  . Intimate partner violence    Fear of current or ex partner: Not on file    Emotionally abused: Not on file    Physically abused: Not on file    Forced sexual activity: Not on file  Other Topics Concern  . Not on file  Social History Narrative      Patient is disabled.   Right handed.   Caffeine - Diet soda., tea   Education - high school     PHYSICAL EXAM   Vitals:   12/19/18 1307  BP: 126/86  Pulse: 92  Temp: (!) 97.1 F (36.2 C)  Weight: 292 lb (132.5 kg)  Height: _0  (1.549 m)    Not recorded      Body mass index is 55.17 kg/m.  PHYSICAL EXAMNIATION:  Gen: NAD, conversant, well nourised, obese, well groomed                     Cardiovascular: Regular rate rhythm, no peripheral edema, warm, nontender. Eyes: Conjunctivae clear without exudates or hemorrhage Neck: Supple, no carotid bruise. Pulmonary: Clear to auscultation bilaterally   NEUROLOGICAL EXAM:  MENTAL STATUS: Speech:    Speech is normal; fluent and spontaneous with normal comprehension.  Cognition:     Orientation to time, place and person     Normal recent and remote memory     Normal Attention span and concentration     Normal Language, naming, repeating,spontaneous speech     Fund of knowledge   CRANIAL NERVES: CN II: Visual fields are full to confrontation. Pupils are round equal and briskly reactive to light. CN III, IV, VI: extraocular movement are normal. No ptosis. CN V: Facial sensation is intact to pinprick in all 3 divisions bilaterally. Corneal responses  are intact.  CN VII: Face is symmetric with normal eye closure and smile. CN VIII: Hearing is normal to rubbing fingers CN IX, X: Palate elevates symmetrically. Phonation is normal. CN XI: Head turning and shoulder shrug are intact CN XII: Tongue is midline with normal movements and no atrophy.  MOTOR: There is no pronator drift of out-stretched arms. Muscle bulk and tone are normal. Muscle strength is normal.  REFLEXES: Reflexes are 2+ and symmetric at the biceps, triceps, knees, and ankles. Plantar responses are flexor.  SENSORY: Intact to light touch, pinprick, positional sensation and vibratory sensation are intact in fingers and toes.  COORDINATION: Rapid alternating movements and fine finger movements are intact. There is no dysmetria on finger-to-nose and heel-knee-shin.    GAIT/STANCE: Posture is normal. Gait is steady with normal steps, base, arm swing, and turning. Heel and toe  walking are normal. Tandem gait is normal.  Romberg is absent.  DIAGNOSTIC DATA (LABS, IMAGING, TESTING) - I reviewed patient records, labs, notes, testing and imaging myself where available.   ASSESSMENT AND PLAN  KUSHI KUN is a 53 y.o. female    History of HIV, hypercholesterolemia, Non hodgkin' lymphoma with chemotherapy and radiation and migraines since age 23.  Chronic migraine headaches She has tried and failed multiple preventive medications including atenolol, verapamil, nortriptyline, and Depakote,Topamax, Topamax ER, zonegran could not tolerate Cardizem,  She has migraines daily for more than  10 years. She is currently taking amitriptyline 25 mg 2 tablets every day and Zomig with some relief. Previously tried imitrex and Maxalt with suboptimal response,  Normal MRI brain, and neurological exam.  BOTOX injection was performed according to protocol by [employer]. 100 units of BOTOX was dissolved into 2 cc NS  Used total 200 units  BOTOX injection was performed according to protocol by  [employer]. 100 units of BOTOX was dissolved into 2 cc NS.      25 units was injected into bilateral temporal parietal region  10 units were injected into bilateral masseters    Iv Depacon infusion today, also add on compazine 73m and toradol 390mas needed.  continue AJOVY monthly injection Zomig, Cambia as needed, may add on Compazine 101mtizanidine 4 mg as needed for prolonged severe headaches,  Restless leg symptoms   Continue Mirapex 0.2 5 mg,  3 tablets every night  Ferritin 51 in Feb 2018,     YijMarcial Pacas.D. Ph.D.  [facility] [id number]ui[place]elle [place]asse[place] [id number]: [phone]: [phone]: Referring Provider

## 2018-12-19 NOTE — Progress Notes (Signed)
**  Botox 100 units x 2 vials, NDC 2929-0903-01, Lot [id number], Exp 08/2021, specialty pharmacy.//mck,rn**

## 2018-12-19 NOTE — Telephone Encounter (Signed)
New lactulose prescription sent.

## 2018-12-20 ENCOUNTER — Telehealth: Payer: Self-pay | Admitting: *Deleted

## 2018-12-20 NOTE — Telephone Encounter (Signed)
Prior authorization started for Ajovy by completing required electronic form through [facility] [phone]). [id number] W.  Pt XM#[id number] S.    PA approved through 12/15/2019.

## 2018-12-26 ENCOUNTER — Ambulatory Visit: Payer: Self-pay | Admitting: Neurology

## 2019-01-09 ENCOUNTER — Telehealth: Payer: Self-pay | Admitting: *Deleted

## 2019-01-09 NOTE — Telephone Encounter (Signed)
PA for zolmitriptan completed through the [facility] [employer].  Pt PJ#[id number] S.  FY#[id number].  Approved through 01/09/2020.

## 2019-01-28 ENCOUNTER — Ambulatory Visit: Payer: Medicaid Other

## 2019-01-31 ENCOUNTER — Other Ambulatory Visit: Payer: Self-pay | Admitting: *Deleted

## 2019-01-31 DIAGNOSIS — G4700 Insomnia, unspecified: Secondary | ICD-10-CM

## 2019-01-31 NOTE — Telephone Encounter (Signed)
Next appt scheduled 8/13 with PCP. 

## 2019-02-02 MED ORDER — ZOLPIDEM TARTRATE 10 MG PO TABS: ORAL_TABLET | ORAL | 2 refills | Status: DC

## 2019-02-04 NOTE — Telephone Encounter (Signed)
Ambien rx called to [facility].

## 2019-02-06 ENCOUNTER — Other Ambulatory Visit: Payer: Self-pay | Admitting: Internal Medicine

## 2019-02-06 ENCOUNTER — Other Ambulatory Visit: Payer: Self-pay | Admitting: Neurology

## 2019-02-06 ENCOUNTER — Telehealth: Payer: Self-pay

## 2019-02-06 ENCOUNTER — Other Ambulatory Visit: Payer: Self-pay | Admitting: Infectious Disease

## 2019-02-06 DIAGNOSIS — E118 Type 2 diabetes mellitus with unspecified complications: Secondary | ICD-10-CM

## 2019-02-06 NOTE — Telephone Encounter (Signed)
Attempted to call patient to schedule overdue appointment with lab and Dr. Tommy Medal. Unable to reach patient at this time; will leave voicemail requesting call back. [place]

## 2019-02-06 NOTE — Telephone Encounter (Signed)
Next appt scheduled 8/13 with PCP. 

## 2019-02-14 ENCOUNTER — Encounter: Payer: Medicaid Other | Admitting: Internal Medicine

## 2019-02-21 ENCOUNTER — Ambulatory Visit: Payer: Medicaid Other | Admitting: Internal Medicine

## 2019-02-21 ENCOUNTER — Encounter: Payer: Self-pay | Admitting: Internal Medicine

## 2019-02-21 ENCOUNTER — Other Ambulatory Visit: Payer: Self-pay

## 2019-02-21 VITALS — BP 159/76 | HR 111 | Temp 99.2°F | Wt 294.3 lb

## 2019-02-21 DIAGNOSIS — Z79899 Other long term (current) drug therapy: Secondary | ICD-10-CM

## 2019-02-21 DIAGNOSIS — Z7984 Long term (current) use of oral hypoglycemic drugs: Secondary | ICD-10-CM | POA: Diagnosis not present

## 2019-02-21 DIAGNOSIS — J45909 Unspecified asthma, uncomplicated: Secondary | ICD-10-CM

## 2019-02-21 DIAGNOSIS — J40 Bronchitis, not specified as acute or chronic: Secondary | ICD-10-CM | POA: Diagnosis not present

## 2019-02-21 DIAGNOSIS — E119 Type 2 diabetes mellitus without complications: Secondary | ICD-10-CM

## 2019-02-21 DIAGNOSIS — Z7951 Long term (current) use of inhaled steroids: Secondary | ICD-10-CM

## 2019-02-21 DIAGNOSIS — F339 Major depressive disorder, recurrent, unspecified: Secondary | ICD-10-CM

## 2019-02-21 DIAGNOSIS — M436 Torticollis: Secondary | ICD-10-CM | POA: Diagnosis not present

## 2019-02-21 DIAGNOSIS — F3341 Major depressive disorder, recurrent, in partial remission: Secondary | ICD-10-CM

## 2019-02-21 LAB — POCT GLYCOSYLATED HEMOGLOBIN (HGB A1C): Hemoglobin A1C: 8.3 % — AB (ref 4.0–5.6)

## 2019-02-21 LAB — GLUCOSE, CAPILLARY: Glucose-Capillary: 205 mg/dL — ABNORMAL HIGH (ref 70–99)

## 2019-02-21 MED ORDER — TRULICITY 0.75 MG/0.5ML ~~LOC~~ SOAJ: 0.7500 mg | SUBCUTANEOUS | 0 refills | Status: DC

## 2019-02-21 MED ORDER — ALBUTEROL SULFATE HFA 108 (90 BASE) MCG/ACT IN AERS: INHALATION_SPRAY | RESPIRATORY_TRACT | 1 refills | Status: AC

## 2019-02-21 MED ORDER — GUAIFENESIN-CODEINE 100-10 MG/5ML PO SOLN: 5.0000 mL | Freq: Three times a day (TID) | ORAL | 0 refills | Status: DC | PRN

## 2019-02-21 MED ORDER — DULERA 100-5 MCG/ACT IN AERO: INHALATION_SPRAY | RESPIRATORY_TRACT | 5 refills | Status: DC

## 2019-02-21 NOTE — Patient Instructions (Addendum)
Thank you for allowing me to provide your care. Today we are:  1) Starting a new medication for your diabetes. This is one you will inject. Please STOP the Januvia prior to starting this medication. START exercising and continue to work on weight loss.   2) I will send out your inhalers and some cough medicine along with an antibiotic if your symptoms do not improve.   3) For your depression I will send out some hydroxyzine but this is not a long term medication. I will also send out an increased dose of your Cymbalta.   4) STOP the promethazine

## 2019-02-22 ENCOUNTER — Telehealth: Payer: Self-pay | Admitting: Internal Medicine

## 2019-02-22 NOTE — Progress Notes (Signed)
   CC: F/u DM, MDD, Neck pain, and cough  HPI:  [name] is a 53 y.o. female with PMHx listed below presenting for F/u DM, MDD, Neck pain, and cough. Please see the A&P for the status of the patient's chronic medical problems.  Past Medical History:  Diagnosis Date  . Acute gastritis without mention of hemorrhage 01/01/2002  . Anemia 10/2009   macrocytic anemia with baseline MCV 104-106  . Anxiety   . Asthma   . Boil 01/05/2017  . 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 Complex tear of medial meniscus of left knee as current injury 02/25/2016  . Complication of anesthesia    hard to wake up  . COPD (chronic obstructive pulmonary disease) (HCC)    chronic bronchitis  . Derangement of anterior horn of lateral meniscus of left knee 02/25/2016  . Duodenitis without mention of hemorrhage 01/01/2002  . DVT (deep venous thrombosis) (HCC)    history clot lt groin when she had lymphoma-in remision now.  . Elevated triglycerides with high cholesterol 01/05/2017  . Gastroparesis   . GERD (gastroesophageal reflux disease)   . Hiatal hernia 01/01/2002  . HIV infection ([place])    CD4 = 570 (05/2010), VL undetectation  . Hyperlipidemia   . IBS (irritable bowel syndrome)   . Migraines    on topamax and triptans, frequent (almost daily) attacks   . MRSA (methicillin resistant staph aureus) culture positive 12/20/2010  . Non Hodgkin's lymphoma ([name])    stage II, s/p resection, chemotherapy, radiotherapy, Dr. Beryle May is her oncologist.   . PUD (peptic ulcer disease) 07/1997   per EGD report 07/1997 with history of esophagitis  . Restless leg syndrome 12/24/2015  . Shingles    in lumbar dermatome  . Spondylolisthesis of lumbar region    L5-S1  . TMJ (dislocation of temporomandibular joint)   . Traumatic tear of lateral meniscus of right knee 04/20/2012  . Vaginal yeast infection 09/25/2017   Review of Systems:  Performed and all others negative.  Physical Exam: Vitals:   02/21/19 1402   BP: (!) 159/76  Pulse: (!) 111  Temp: 99.2 F (37.3 C)  TempSrc: Oral  SpO2: 97%  Weight: 294 lb 4.8 oz (133.5 kg)   General: Obese female in no acute distress HENT: Normocephalic, atraumatic, moist mucus membranes, TM clear on the right, hole in the TM on the left  Pulm: Good air movement with no wheezing or crackles  CV: RRR, no murmurs, no rubs   Assessment & Plan:   See Encounters Tab for problem based charting.  Patient discussed with Dr. Evette May

## 2019-02-22 NOTE — Telephone Encounter (Signed)
Return call to pt - stated rx for Hydroxyzine was not at the pharmacy; needs rx for abx(Augmentin) for chronic bronchitis; she was told her doctor will increase Cymbalta's dose; and will need PA for Trulicity.

## 2019-02-22 NOTE — Telephone Encounter (Signed)
Pt is calling regarding about medicine that was supposed to be sent yesterday [phone]

## 2019-02-24 ENCOUNTER — Encounter: Payer: Self-pay | Admitting: Internal Medicine

## 2019-02-24 DIAGNOSIS — M436 Torticollis: Secondary | ICD-10-CM | POA: Insufficient documentation

## 2019-02-24 NOTE — Assessment & Plan Note (Signed)
HPI:  Patient voices concerns about intermittent neck spasms. She states that her head will turn to the right and she will feel cramped in her neck. Her head will stay this way for several hours before releasing. This is new over the last couple months. She is not sure what is causing it. She feels that is worse whenever she is stressed out.  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 She is currently on Pramipexole and Prochlorerazine both of which can cause torticollis. We will discontinue these medications to see if this stops.

## 2019-02-24 NOTE — Assessment & Plan Note (Signed)
>>  ASSESSMENT AND PLAN FOR TYPE 2 DIABETES MELLITUS (HCC) WRITTEN ON 02/24/2019  5:47 PM BY HELBERG, JUSTIN, MD  HPI:  Patient states that she has been checking her blood sugars at home and notes that there consistently in the 200s. She is gained 10 pounds since the start of COVID. She states that she is not exercising and has not been watching her diet. She continues to take [place]. She spoke with her endocrinologist's office who recommended starting insulin however she is reluctant. She states that she has several cousins that have had adverse reactions the insulin. She is not opposed to injection medications. She is not had any hypoglycemic events.  A/P: - A1c increased to 8.3 from 6.9  - Start Trulicity and Stop Januvia  - Continue Farxiga

## 2019-02-24 NOTE — Assessment & Plan Note (Signed)
HPI:  Patient continues to feel depressed. She declined filling out a PHQ-9. She was started on Cymbalta 6 months ago and felt an initial improvement but now feels it is not working. She has had a lot of stress with COVID and her family's health recently. She denies SI or HI.  A/P: - We discussed augmentation therapy with Wellbutrin or BuSpar. She is declined. - She has done her own research and would like to start hydroxyzine. After discussion with Dr. Evette Doffing we will not start this medication given that she is on multiple anticholinergic medications. - We discussed increasing Cymbalta but she is reluctant to that. We will continue to monitor.

## 2019-02-24 NOTE — Assessment & Plan Note (Signed)
HPI:  Patient states that she has been checking her blood sugars at home and notes that there consistently in the 200s. She is gained 10 pounds since the start of COVID. She states that she is not exercising and has not been watching her diet. She continues to take [place]. She spoke with her endocrinologist's office who recommended starting insulin however she is reluctant. She states that she has several cousins that have had adverse reactions the insulin. She is not opposed to injection medications. She is not had any hypoglycemic events.  A/P: - A1c increased to 8.3 from 6.9  - Start Trulicity and Stop [place]

## 2019-02-24 NOTE — Assessment & Plan Note (Signed)
HPI:  Patient presents with four weeks of productive cough. States that she produces green sputum. She states that she is gone through 3 to 4 bottles of cough medicine. She is having difficulty sleeping because of the cough. She is requesting an antibiotic and cough medicine. She also needs refills on her Dulera and albuterol.  A/P: - Refill Dulera and albuterol  - Cough medicine sent  - No Abx

## 2019-02-25 ENCOUNTER — Telehealth: Payer: Self-pay | Admitting: *Deleted

## 2019-02-25 NOTE — Progress Notes (Signed)
Internal Medicine Clinic Attending  Case discussed with Dr. Helberg at the time of the visit.  We reviewed the resident's history and exam and pertinent patient test results.  I agree with the assessment, diagnosis, and plan of care documented in the resident's note.    

## 2019-02-25 NOTE — Telephone Encounter (Signed)
Information to [employer] for PA for [employer].  Not on formulary.  Patient will need to try and fail Byetta, Bydureon and Victoza.  Message sent to Dr. Tarri Abernethy to consider a change.  Sander Nephew, RN 02/25/2019 11:47 AM.

## 2019-02-28 DIAGNOSIS — L299 Pruritus, unspecified: Secondary | ICD-10-CM | POA: Diagnosis not present

## 2019-02-28 DIAGNOSIS — J209 Acute bronchitis, unspecified: Secondary | ICD-10-CM | POA: Diagnosis not present

## 2019-02-28 DIAGNOSIS — R05 Cough: Secondary | ICD-10-CM | POA: Diagnosis not present

## 2019-02-28 DIAGNOSIS — J0191 Acute recurrent sinusitis, unspecified: Secondary | ICD-10-CM | POA: Diagnosis not present

## 2019-02-28 NOTE — Telephone Encounter (Signed)
Called patient. Addressed concerns. Thanks.  Dr. Tarri Abernethy

## 2019-03-02 NOTE — Telephone Encounter (Signed)
Attempted to call the patient to discuss PA. She did not answer. No voicemail left. I will attempt to call her later to discuss starting Byetta, Bydureon, or Victoza. Thank you.

## 2019-03-04 ENCOUNTER — Telehealth: Payer: Self-pay | Admitting: Internal Medicine

## 2019-03-04 DIAGNOSIS — E119 Type 2 diabetes mellitus without complications: Secondary | ICD-10-CM

## 2019-03-04 NOTE — Telephone Encounter (Signed)
Informed pt Trulicity is not covered by her insurance; she will have try and fail others before it will be covered. Informed her doctor had tried calling her 2 days ago to discuss alternatives - stated she was feeling bad on Saturday so she turned her phone off. Stated she will try either med her doctor chooses - stated her BS was 250 ac eating then up to 450 when checked afterwards. Thanks

## 2019-03-04 NOTE — Telephone Encounter (Signed)
See todays telephone encounter

## 2019-03-04 NOTE — Telephone Encounter (Signed)
Pt is having trouble getting her medicine Dulaglutide (TRULICITY) [id number] [id number] SOPN , it's reporting that she is pregnant but she isn't; pls contact [phone]

## 2019-03-05 ENCOUNTER — Telehealth: Payer: Self-pay | Admitting: Internal Medicine

## 2019-03-05 MED ORDER — LIRAGLUTIDE 18 MG/3ML ~~LOC~~ SOPN: PEN_INJECTOR | SUBCUTANEOUS | 0 refills | Status: DC

## 2019-03-05 NOTE — Telephone Encounter (Signed)
Pt reporting her Diabetes Medications need Prior Authorizations before she can get them.  Pt would like a call back.

## 2019-03-05 NOTE — Telephone Encounter (Signed)
Discontinued Trulicity due to cost. Prescription for victoza sent in.

## 2019-03-05 NOTE — Telephone Encounter (Signed)
Victoza prescription sent in. Thank you.

## 2019-03-05 NOTE — Telephone Encounter (Signed)
Called pt to let her know about new rx for Victoza - no answer; left message.

## 2019-03-06 ENCOUNTER — Other Ambulatory Visit: Payer: Self-pay | Admitting: Neurology

## 2019-03-06 ENCOUNTER — Telehealth: Payer: Self-pay | Admitting: *Deleted

## 2019-03-06 NOTE — Telephone Encounter (Signed)
PA for diclofenac sodium tablets approved by [place] [employer].  [id number].  Approval valid through 03/05/2020.

## 2019-03-06 NOTE — Telephone Encounter (Signed)
Patient's medication had been changed to the preferred.  Doctor has been trying to reach her about the need to try the other meds first.

## 2019-03-13 ENCOUNTER — Telehealth: Payer: Self-pay | Admitting: *Deleted

## 2019-03-13 ENCOUNTER — Telehealth: Payer: Self-pay | Admitting: Internal Medicine

## 2019-03-13 NOTE — Telephone Encounter (Signed)
Tried to call twice, lm for rtc

## 2019-03-13 NOTE — Telephone Encounter (Signed)
[name] can you speak to her and can we give her a sample (if we have them) till she gets her pen?

## 2019-03-13 NOTE — Telephone Encounter (Signed)
Thank you :)

## 2019-03-13 NOTE — Telephone Encounter (Signed)
Pt rtc, she states her cbg's are rising but she can wait to see if the insurance approves the victoza, insurance suggested victoza, gladys is working on this

## 2019-03-13 NOTE — Telephone Encounter (Signed)
If she is sick we can make an appointment for her to be seen

## 2019-03-13 NOTE — Telephone Encounter (Signed)
Pt is still waiting for the prior authorization for the new insulin pen, pt said pharmacy is still... In the mean time patient sugar is high and making her sick. Can someone reach out to patient to let her know what's going on [phone]

## 2019-03-13 NOTE — Telephone Encounter (Signed)
Has been approved, pharm notified

## 2019-03-14 DIAGNOSIS — E1165 Type 2 diabetes mellitus with hyperglycemia: Secondary | ICD-10-CM | POA: Diagnosis not present

## 2019-03-20 ENCOUNTER — Ambulatory Visit: Payer: Medicaid Other

## 2019-03-27 ENCOUNTER — Ambulatory Visit: Payer: Medicaid Other | Admitting: Neurology

## 2019-03-27 ENCOUNTER — Encounter: Payer: Self-pay | Admitting: Neurology

## 2019-03-27 ENCOUNTER — Telehealth: Payer: Self-pay | Admitting: *Deleted

## 2019-03-27 ENCOUNTER — Telehealth: Payer: Self-pay | Admitting: Neurology

## 2019-03-27 NOTE — Telephone Encounter (Signed)
Pt called to cancel her Botox appt the same day.

## 2019-03-27 NOTE — Telephone Encounter (Signed)
Pt called stating she will not be able to come to her appt today Apr 02, 2023 due to a death in her family. Please call back to r/s.

## 2019-03-28 NOTE — Telephone Encounter (Signed)
I called to rs the patient but she did not answer so I left a VM asking her to call me back. DW

## 2019-04-02 NOTE — Telephone Encounter (Signed)
Pt returning call please call back °

## 2019-04-02 NOTE — Telephone Encounter (Signed)
I called and scheduled the patient for her injection. DW

## 2019-04-08 ENCOUNTER — Other Ambulatory Visit: Payer: Medicaid Other

## 2019-04-10 ENCOUNTER — Other Ambulatory Visit: Payer: Self-pay

## 2019-04-10 ENCOUNTER — Other Ambulatory Visit: Payer: Medicaid Other

## 2019-04-10 DIAGNOSIS — B20 Human immunodeficiency virus [HIV] disease: Secondary | ICD-10-CM

## 2019-04-11 LAB — T-HELPER CELL (CD4) - (RCID CLINIC ONLY)
CD4 % Helper T Cell: 35 % (ref 33–65)
CD4 T Cell Abs: 908 /uL (ref 400–1790)

## 2019-04-15 LAB — CBC WITH DIFFERENTIAL/PLATELET
Absolute Monocytes: 370 cells/uL (ref 200–950)
Basophils Absolute: 26 cells/uL (ref 0–200)
Basophils Relative: 0.3 %
Eosinophils Absolute: 86 cells/uL (ref 15–500)
Eosinophils Relative: 1 %
HCT: 49.3 % — ABNORMAL HIGH (ref 35.0–45.0)
Hemoglobin: 15.7 g/dL — ABNORMAL HIGH (ref 11.7–15.5)
Lymphs Abs: 2683 cells/uL (ref 850–3900)
MCH: 25.6 pg — ABNORMAL LOW (ref 27.0–33.0)
MCHC: 31.8 g/dL — ABNORMAL LOW (ref 32.0–36.0)
MCV: 80.4 fL (ref 80.0–100.0)
MPV: 9.3 fL (ref 7.5–12.5)
Monocytes Relative: 4.3 %
Neutro Abs: 5435 cells/uL (ref 1500–7800)
Neutrophils Relative %: 63.2 %
Platelets: 284 10*3/uL (ref 140–400)
RBC: 6.13 10*6/uL — ABNORMAL HIGH (ref 3.80–5.10)
RDW: 15.7 % — ABNORMAL HIGH (ref 11.0–15.0)
Total Lymphocyte: 31.2 %
WBC: 8.6 10*3/uL (ref 3.8–10.8)

## 2019-04-15 LAB — LIPID PANEL
Cholesterol: 291 mg/dL — ABNORMAL HIGH (ref <200)
HDL: 39 mg/dL — ABNORMAL LOW (ref >=50)
Non-HDL Cholesterol (Calc): 252 mg/dL (calc) — ABNORMAL HIGH (ref <130)
Total CHOL/HDL Ratio: 7.5 (calc) — ABNORMAL HIGH (ref <5.0)
Triglycerides: 581 mg/dL — ABNORMAL HIGH (ref <150)

## 2019-04-15 LAB — HIV-1 RNA QUANT-NO REFLEX-BLD
HIV 1 RNA Quant: <20 copies/mL
HIV-1 RNA Quant, Log: <1.3 Log copies/mL

## 2019-04-15 LAB — RPR: RPR Ser Ql: NONREACTIVE

## 2019-04-17 ENCOUNTER — Telehealth: Payer: Self-pay | Admitting: *Deleted

## 2019-04-17 ENCOUNTER — Encounter: Payer: Self-pay | Admitting: Neurology

## 2019-04-17 ENCOUNTER — Other Ambulatory Visit: Payer: Self-pay | Admitting: Neurology

## 2019-04-17 ENCOUNTER — Ambulatory Visit: Payer: Medicaid Other | Admitting: Neurology

## 2019-04-17 ENCOUNTER — Other Ambulatory Visit: Payer: Self-pay

## 2019-04-17 VITALS — BP 141/88 | HR 102 | Temp 97.6°F | Ht 61.0 in | Wt 287.5 lb

## 2019-04-17 DIAGNOSIS — G43719 Chronic migraine without aura, intractable, without status migrainosus: Secondary | ICD-10-CM

## 2019-04-17 MED ORDER — LAMOTRIGINE 100 MG PO TABS: 100.0000 mg | ORAL_TABLET | Freq: Two times a day (BID) | ORAL | 11 refills | Status: DC

## 2019-04-17 MED ORDER — AIMOVIG 140 MG/ML ~~LOC~~ SOAJ: 140.0000 mg | SUBCUTANEOUS | 11 refills | Status: DC

## 2019-04-17 MED ORDER — LAMOTRIGINE 25 MG PO TABS: ORAL_TABLET | ORAL | 0 refills | Status: DC

## 2019-04-17 MED ORDER — KETOROLAC TROMETHAMINE 60 MG/2ML IM SOLN: 60.0000 mg | Freq: Once | INTRAMUSCULAR | Status: DC

## 2019-04-17 NOTE — Telephone Encounter (Signed)
Prior authorization started for Aimovig by completing required electronic form through [facility] Medicaid. [id number]. Pt [id number] S.  PA approved through 07/16/2019.

## 2019-04-17 NOTE — Progress Notes (Signed)
PATIENT: [name] DOB: 1966-05-15  Chief Complaint  Patient presents with  . Migraine    Botox 100 units x 2 vials - specialty pharmacy.  She would like to discuss her preventive medications today.      HISTORICAL  [name], Is accompanied by her sister returned for Botox injection as migraine prevention  She has past medical history of HIV, hyperlipidemia, migraines, depression, and non-Hodgkin lymphoma in 2005 with chemotherapy and radiation.   She has history of migraines since 19. In the last 10 years, she reports having daily migraines. She is currently taking Topamax and Zomig with relief. She takes approximately 20-25 tablets of her Zomig per month.    She has aura's that look like floating dirt particles, flashing lights, and she sees Christmas tree lights when they are severe, she has nausea, vomiting and sometimes diarrhea when they're severe. She also complains of diplopia, blurry vision, and  photophobia. She also has had a lightning bolt sensation throughout her body.  She describes them as pulsating and throbbing  on a scale of 5-6/10 which she considers mild, when they are severe they can go as high as 10/10. They usually start in the right temporal area, but will also start on the left temporal and are worse.   Any type of smoke or MSG can precipitate a migraine. They generally last 2-9 days. She has been to the emergency department twice in the last year for migraines. She states "I just can't do this anymore I need to do something about her migraines"  She has tried and failed different preventive medications:  atenolol, verapamil, nortriptyline, Inderal, Depakote without benefit. Atenolol and verapamil caused hypotension. Depakote caused severe vomiting. Topamax causing her worsening headaches, will show tried Zonegran, Topamax ER, she denies significant improvement.  For abortive treatment, she has tried Imitrex, Maxalt with suboptimal response, Zomig as needed  since to work best,  she also takes transderm scophalamine patch for nause  I started to give her BOTOX injection since May 2013, every 3 months, she responded very well,   Previous MRI brain was normal in 2013.  Last Botox injection as migraine prevention was March 2017, she responded very well, but in recent few weeks, she noticed increased migraine headaches, this baby had headache left retro-orbital area severe pounding headache with associated light noise sensitivity, has been ongoing for 3 days, failed to respond to Zomig by mouth, Imitrex injection as needed, she has constant dry heaves, difficulty opening her eyes, In addition, today she complains of restless leg symptoms, difficult to stand or lying still for extended period of time, has tried Neurontin-cause nausea, confusion,  Lyrica-cause excessive weight gain, she has never tried dopamine agonist in the past  I reviewed laboratory in April 2017, normal hemoglobin 12 point 9, mild elevated RDW 15.8, decreased MCV   Update April 27 2016  Last Botox injection was in June 2017, she had left knee arthroscopic surgery in August 2017, complaining of significant joints pain, unsteady gait, before Botox she was having moderate to severe headache daily, even Botox now, she used to 12 tablets of Zomig every months because of frequent moderate to severe headache  Update August 10 2016: Last Botox injection was in October 2017, has been very helpful, but over the past 3-4 weeks, she had frequent almost daily headaches, oftentimes wake her up from sleep, Zomig has been very helpful,  Low dose Mirapex 0.25 milligrams 2 tablets every night has been very  helpful for her restless leg symptoms, she reported 80% improvement, laboratory reviewed in January 2018, CD4 count 760, normal hemoglobin 12.3, mildly decreased MCV, increased RDW,  Update Nov 09 2016: She suffered bilateral ear infection, strep throat since last visit, had frequent headaches,  nausea,  Update February 22 2017: She responded well to previous injection, complains of a lot of stress at home  UPDATE May 31 2017: She complains suboptimal response to Botox injection, had daily headache over last week, also complains of a lot of stress at home, average more than 4 headaches in 1 week, lasting all day, with associated nausea vomiting, is taking Zomig as needed, with partial response  UPDATE September 08 2017: She complains of excessive stress, frequent migraine headaches,  UPDATE December 20 2017: She is accompanied by her sister at today's clinical visit, Ajovy injection has helped her migraine, she rated as 25%, but with the combination of Ajovy and Botox injection, she still has 3 migraine headaches each week, has been dealing with daily headaches over the past 1 week, required multiple dose of Zomig treatment  UPDATE Sept 25 2019: She responded well to previous injection, today she complains of 2 weeks history of severe headaches failed home remedy treatment, multiple dose of Zomig,  She is also getting Ajovy injection every month, which has been helpful,  UPDATE Jun 21 2018: she overall responding well to Botox, Ajovy, also taking amitriptyline 25 mg 2 tablets every night, Zomig as needed for abortive treatment, couple headaches each week, usually responding well to triptan treatment, but this particular headache been ongoing for 2 weeks, tried and failed multiple home medications.  UPDATE September 20 2018: She responded well to previous injection, but at the end of injection cycle,She would have more frequent prolonged headaches, she used Zomig as needed,  UPDATE December 19 2018: She responded very well to previous injection.  UPDATE Apr 17 2019: She is with her sister at today's visit, she reported a lot of stress at home, has increased frequency of migraines, almost daily basis over the past few weeks, she also suffered depression anxiety  REVIEW OF SYSTEMS: Full 14 system  review of systems performed and notable only for as above  ALLERGIES: Allergies  Allergen Reactions  . Diazepam     Triggers migraines  . Divalproex Sodium Nausea And Vomiting    Causes light-headedness  . Gabapentin Nausea And Vomiting    confusion  . Ondansetron Hcl Nausea And Vomiting    Triggers migraines, "makes me vomit"  . Penicillins     REACTION: lips swollen to point of bleeding, sever vaginal irritation  Reports tolerating augmentin well without any complaints or reaction  . Pineapple   . Propoxyphene N-Acetaminophen Nausea And Vomiting  . Simvastatin     PATIENT CANNOT BE PRESCRIBED THIS STATIN WHILE RECEIVING NORVIR  . Topamax [Topiramate]     Migraines   . Tramadol Hcl Itching    REACTION: migraines and itching--hx of migraines even without medication   . Zocor [Simvastatin - High Dose]     HOME MEDICATIONS: Current Outpatient Medications  Medication Sig Dispense Refill  . AJOVY 225 MG/1.5ML SOSY INJECT 225MG SUBCUTANEOUSLY ONCE EVERY 30 DAYS 1.5 mL 11  . albuterol (PROAIR HFA) 108 (90 Base) MCG/ACT inhaler USE 1 TO 2 PUFFS EVERY 4-6 HOURS AS NEEDED FOR SHORTNESS OF BREATH ORWHEEZING 8.5 g 1  . amitriptyline (ELAVIL) 25 MG tablet Take 1 tablet (32m) every night for 7 days then increase to 2 tablets (5110m  every night 180 tablet 3  . Beclomethasone Dipropionate 80 MCG/ACT AERS Place 2 Inhalers into the nose daily. 8.7 g 10  . blood glucose meter kit and supplies Dispense based on patient and insurance preference. Use up to four times daily as directed. (FOR ICD-9 250.00, 250.01). 1 each 0  . BOTOX 100 units SOLR injection FOR OFFICE ADMINISTRATION. PHYSICIAN TO INJECT UP TO 200 UNITS INTRAMUSCULARLY INTO HEAD, FACE AND NECK AREA EVERY 90 DAYS. 2 each 2  . Calcium Carbonate-Vitamin D (CALCIUM-VITAMIN D) 500-200 MG-UNIT per tablet Take 2 tablets by mouth daily.     . dapagliflozin propanediol (FARXIGA) 10 MG TABS tablet Take 10 mg by mouth daily. 90 tablet 1  .  diclofenac (CATAFLAM) 50 MG tablet TAKE 1 TO 2 TABLETS BY MOUTH DAILY FOR MIGRAINE (NOT MORE THAN 2 TABLETS A DAY)OR 12/MONTH 12 tablet 5  . Diclofenac Potassium (CAMBIA) 50 MG PACK Take 50 mg by mouth as needed. 12 each 11  . diclofenac sodium (VOLTAREN) 1 % GEL APPLY TOPICALLY TO AFFECTED AREA 4 GRAMS4 TIMES DAILY 100 g 11  . DULoxetine (CYMBALTA) 60 MG capsule Take 1 capsule (60 mg total) by mouth daily. 90 capsule 2  . fluconazole (DIFLUCAN) 100 MG tablet TAKE 1 TABLET BY MOUTH EVERY DAY 14 tablet 3  . fluocinonide (LIDEX) 0.05 % external solution 1 gtt AU qD prn itching    . glucose blood test strip Use as instructed 100 each 12  . guaiFENesin-codeine 100-10 MG/5ML syrup Take 5 mLs by mouth 3 (three) times daily as needed for cough. 120 mL 0  . lactulose (CHRONULAC) 10 GM/15ML solution Take 30 mLs (20 g total) by mouth 2 (two) times daily. 1892 mL 6  . liraglutide (VICTOZA) 18 MG/3ML SOPN Inject 0.6 mg daily for 1 week, then increase to 1.2 mg daily 3 pen 0  . lisinopril (PRINIVIL,ZESTRIL) 10 MG tablet TAKE 1 TABLET BY MOUTH EVERY DAY FOR BLOOD PRESSURE 90 tablet 2  . mometasone-formoterol (DULERA) 100-5 MCG/ACT AERO USE 2 PUFFS BY MOUTH THE FIRST THING IN THE MORNING AND THEN ANOTHER 2 PUFFS ABOUT 12 HOURS LATER 13 g 5  . pantoprazole (PROTONIX) 40 MG tablet TAKE ONE TABLET BY MOUTH TWICE A DAY FORACID REFLUX 60 tablet 4  . pravastatin (PRAVACHOL) 40 MG tablet TAKE 1 TABLET BY MOUTH EVERY DAY FOR CHOLESTEROL 90 tablet 1  . scopolamine (TRANSDERM-SCOP, 1.5 MG,) 1 MG/3DAYS PLACE 1 PATCH ON SKIN EVERY 3 DAYS 10 patch 11  . SYMTUZA 800-150-200-10 MG TABS TAKE 1 TABLET BY MOUTH EVERY MORNING WITH BREAKFAST 30 tablet 5  . Tamsulosin HCl (FLOMAX) 0.4 MG CAPS Take 0.4 mg by mouth daily. 1-2 daily    . terconazole (TERAZOL 7) 0.4 % vaginal cream APPLY 1 APPLICATORFUL VAGINALLY EACH NIGHT AT BEDTIME 45 g 0  . tiZANidine (ZANAFLEX) 4 MG tablet TAKE 1 TABLET BY MOUTH 2 TIMES A DAY AS NEEDED FOR MUSCLE  SPASMS 60 tablet 11  . zolmitriptan (ZOMIG) 5 MG tablet Take 1 tab at onset of migraine.  May repeat, with 1 tab, in 2 hrs, if needed.  Max dose of 2 tabs/24hrs.  Must last 30 days. #12/30 12 tablet 11  . zolpidem (AMBIEN) 10 MG tablet TAKE 1 TABLET BY MOUTH EACH NIGHT AT BEDTIME AS NEEDED FOR SLEEP 30 tablet 2   No current facility-administered medications for this visit.     PAST MEDICAL HISTORY: Past Medical History:  Diagnosis Date  . Acute gastritis without mention of hemorrhage  01/01/2002  . Anemia 10/2009   macrocytic anemia with baseline MCV 104-106  . Anxiety   . Asthma   . Boil 01/05/2017  . 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 Complex tear of medial meniscus of left knee as current injury 02/25/2016  . Complication of anesthesia    hard to wake up  . COPD (chronic obstructive pulmonary disease) (HCC)    chronic bronchitis  . Derangement of anterior horn of lateral meniscus of left knee 02/25/2016  . Duodenitis without mention of hemorrhage 01/01/2002  . DVT (deep venous thrombosis) (HCC)    history clot lt groin when she had lymphoma-in remision now.  . Elevated triglycerides with high cholesterol 01/05/2017  . Gastroparesis   . GERD (gastroesophageal reflux disease)   . Hiatal hernia 01/01/2002  . HIV infection ([place])    CD4 = 570 (05/2010), VL undetectation  . Hyperlipidemia   . IBS (irritable bowel syndrome)   . Migraines    on topamax and triptans, frequent (almost daily) attacks   . MRSA (methicillin resistant staph aureus) culture positive 12/20/2010  . Non Hodgkin's lymphoma ([place])    stage II, s/p resection, chemotherapy, radiotherapy, Dr. Beryle Beams is her oncologist.   . PUD (peptic ulcer disease) 07/1997   per EGD report 07/1997 with history of esophagitis  . Restless leg syndrome 12/24/2015  . Shingles    in lumbar dermatome  . Spondylolisthesis of lumbar region    L5-S1  . TMJ (dislocation of temporomandibular joint)   . Traumatic tear of lateral meniscus  of right knee 04/20/2012  . Vaginal yeast infection 09/25/2017    PAST SURGICAL HISTORY: Past Surgical History:  Procedure Laterality Date  . CHOLECYSTECTOMY    . ENDOMETRIAL ABLATION    . KNEE ARTHROSCOPY Right 04/20/12  . KNEE ARTHROSCOPY WITH LATERAL MENISECTOMY Left 02/25/2016   Procedure: LEFT KNEE ARTHROSCOPY WITH MEDIAL AND  LATERAL MENISECTOMIES;  Surgeon: Marchia Bond, MD;  Location: [facility];  Service: Orthopedics;  Laterality: Left;  . KNEE ARTHROSCOPY WITH MEDIAL MENISECTOMY Left 02/25/2016   Procedure: KNEE ARTHROSCOPY WITH MEDIAL MENISECTOMY;  Surgeon: Marchia Bond, MD;  Location: [facility];  Service: Orthopedics;  Laterality: Left;  . TONSILLECTOMY    . TYMPANOPLASTY Left   . TYMPANOSTOMY TUBE PLACEMENT     as child-both  . UPPER GASTROINTESTINAL ENDOSCOPY  2003, 2007   done by Dr. Velora Heckler    FAMILY HISTORY: Family History  Problem Relation Age of Onset  . Hypertension Mother   . Stroke Mother   . Heart disease Mother   . Diabetes Mother   . Breast cancer Paternal Grandmother   . Kidney cancer Maternal Uncle   . Heart disease Maternal Uncle   . Diabetes Maternal Uncle   . Diabetes Maternal Grandmother   . Heart disease Maternal Grandmother   . Emphysema Paternal Uncle        never smoker  . Emphysema Paternal Grandfather        never smoker  . Colon cancer Paternal Aunt        with met to lung   . Non-Hodgkin's lymphoma Maternal Uncle   . Heart disease Maternal Grandfather     SOCIAL HISTORY:  Social History   Socioeconomic History  . Marital status: Divorced    Spouse name: Not on file  . Number of children: 0  . Years of education: 72  . Highest education level: Not on file  Occupational History  . Occupation: disabled  Employer: UNEMPLOYED  Social Needs  . Financial resource strain: Not on file  . Food insecurity    Worry: Not on file    Inability: Not on file  . Transportation needs    Medical: Not on  file    Non-medical: Not on file  Tobacco Use  . Smoking status: Never Smoker  . Smokeless tobacco: Never Used  Substance and Sexual Activity  . Alcohol use: No    Alcohol/week: 0.0 standard drinks  . Drug use: No  . Sexual activity: Not Currently    Partners: Male    Birth control/protection: Surgical  Lifestyle  . Physical activity    Days per week: Not on file    Minutes per session: Not on file  . Stress: Not on file  Relationships  . Social Herbalist on phone: Not on file    Gets together: Not on file    Attends religious service: Not on file    Active member of club or organization: Not on file    Attends meetings of clubs or organizations: Not on file    Relationship status: Not on file  . Intimate partner violence    Fear of current or ex partner: Not on file    Emotionally abused: Not on file    Physically abused: Not on file    Forced sexual activity: Not on file  Other Topics Concern  . Not on file  Social History Narrative      Patient is disabled.   Right handed.   Caffeine - Diet soda., tea   Education - high school     PHYSICAL EXAM   Vitals:   04/17/19 1335  BP: (!) 141/88  Pulse: (!) 102  Temp: 97.6 F (36.4 C)  Weight: 287 lb 8 oz (130.4 kg)  Height: 5' 1"  (1.549 m)    Not recorded      Body mass index is 54.32 kg/m.  DIAGNOSTIC DATA (LABS, IMAGING, TESTING) - I reviewed patient records, labs, notes, testing and imaging myself where available.   ASSESSMENT AND PLAN  [name] is a 53 y.o. female    History of HIV, hypercholesterolemia, Non hodgkin' lymphoma with chemotherapy and radiation and migraines since age 50.   Chronic migraine headaches She has tried and failed multiple preventive medications including atenolol, verapamil, nortriptyline, and Depakote,Topamax, Topamax ER, zonegran could not tolerate Cardizem,  She has migraines daily for more than  10 years. She is currently taking amitriptyline 25 mg 2  tablets every day and Zomig with some relief. Previously tried imitrex and Maxalt with suboptimal response,  Normal MRI brain, and neurological exam.  Botox injection for chronic migraine prevention, injection was performed according to [place] protocol,  5 units of Botox was injected into each side, for 31 injection sites, total of 155 units  Bilateral temporalis 8 injection sites Bilateral occipitalis 6 injection sites Bilateral cervical paraspinals 10 injection sites Bilateral upper trapezius 6 injection sites  Extra 50 unites were injected into bilateral parietal temporal, and upper cervical region.   We will switch to aimovig 140 mg every months as preventive medications Add on lamotrigine, titrating to 100 mg twice a day Zomig, Cambia as needed, may add on Compazine 51m, tizanidine 4 mg as needed for prolonged severe headaches,  Restless leg symptoms   Continue Mirapex 0.2 5 mg,  3 tablets every night  Ferritin 51 in Feb 2018,     YMarcial Pacas M.D. Ph.D.  [facility] [address], [place], [place] [address] Ph: [phone] Fax: [phone]  CC: Referring Provider

## 2019-04-17 NOTE — Progress Notes (Signed)
**  Botox 100 units x 2 vials, NDC [id number], Lot [id number], Exp 10/2021, specialty pharmacy.//mck,rn**  **Toradol 60mg  IM once, Lot [id number], exp 12/2020, given R glut without issue, pressure/bandage applied.//mck,rn**

## 2019-04-22 ENCOUNTER — Encounter: Payer: Self-pay | Admitting: Infectious Disease

## 2019-04-22 ENCOUNTER — Ambulatory Visit (INDEPENDENT_AMBULATORY_CARE_PROVIDER_SITE_OTHER): Payer: Medicaid Other | Admitting: Infectious Disease

## 2019-04-22 ENCOUNTER — Other Ambulatory Visit: Payer: Self-pay

## 2019-04-22 VITALS — BP 122/84 | HR 98 | Temp 98.1°F

## 2019-04-22 DIAGNOSIS — E119 Type 2 diabetes mellitus without complications: Secondary | ICD-10-CM

## 2019-04-22 DIAGNOSIS — Z23 Encounter for immunization: Secondary | ICD-10-CM

## 2019-04-22 DIAGNOSIS — B20 Human immunodeficiency virus [HIV] disease: Secondary | ICD-10-CM | POA: Diagnosis not present

## 2019-04-22 DIAGNOSIS — Z79899 Other long term (current) drug therapy: Secondary | ICD-10-CM | POA: Diagnosis not present

## 2019-04-22 DIAGNOSIS — L0292 Furuncle, unspecified: Secondary | ICD-10-CM

## 2019-04-22 MED ORDER — SYMTUZA 800-150-200-10 MG PO TABS: 1.0000 | ORAL_TABLET | Freq: Every day | ORAL | 11 refills | Status: DC

## 2019-04-22 MED ORDER — CEPHALEXIN 500 MG PO CAPS: 500.0000 mg | ORAL_CAPSULE | Freq: Four times a day (QID) | ORAL | 2 refills | Status: DC

## 2019-04-22 NOTE — Progress Notes (Signed)
Chief complaints: Nausea and vomiting which she triplets your diabetic medications and also boils on her buttocks Subjective:    Patient ID: [name], female    DOB: 1966/05/19, 53 y.o.   MRN: [id number]  HPI  [name] is a 53 y.o. female who is doing superbly well on her  antiviral regimen, SYMTUZA with undetectable viral load and health cd4 count.   Comorbid diabetes mellitus morbid obesity degenerative disc disease and osteoarthritis.  She is having some nausea which she attributes to her recent new changes to her diabetic medications.  She also has boils on her buttocks that she would like me to prescribe Keflex for.   Past Medical History:  Diagnosis Date  . Acute gastritis without mention of hemorrhage 01/01/2002  . Anemia 10/2009   macrocytic anemia with baseline MCV 104-106  . Anxiety   . Asthma   . Boil 01/05/2017  . 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 Complex tear of medial meniscus of left knee as current injury 02/25/2016  . Complication of anesthesia    hard to wake up  . COPD (chronic obstructive pulmonary disease) (HCC)    chronic bronchitis  . Derangement of anterior horn of lateral meniscus of left knee 02/25/2016  . Duodenitis without mention of hemorrhage 01/01/2002  . DVT (deep venous thrombosis) (HCC)    history clot lt groin when she had lymphoma-in remision now.  . Elevated triglycerides with high cholesterol 01/05/2017  . Gastroparesis   . GERD (gastroesophageal reflux disease)   . Hiatal hernia 01/01/2002  . HIV infection ([place])    CD4 = 570 (05/2010), VL undetectation  . Hyperlipidemia   . IBS (irritable bowel syndrome)   . Migraines    on topamax and triptans, frequent (almost daily) attacks   . MRSA (methicillin resistant staph aureus) culture positive 12/20/2010  . 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 Beryle Beams is her oncologist.   . PUD (peptic ulcer disease) 07/1997   per EGD report  07/1997 with history of esophagitis  . Restless leg syndrome 12/24/2015  . Shingles    in lumbar dermatome  . Spondylolisthesis of lumbar region    L5-S1  . TMJ (dislocation of temporomandibular joint)   . Traumatic tear of lateral meniscus of right knee 04/20/2012  . Vaginal yeast infection 09/25/2017   Past Surgical History:  Procedure Laterality Date  . CHOLECYSTECTOMY    . ENDOMETRIAL ABLATION    . KNEE ARTHROSCOPY Right 04/20/12  . KNEE ARTHROSCOPY WITH LATERAL MENISECTOMY Left 02/25/2016   Procedure: LEFT KNEE ARTHROSCOPY WITH MEDIAL AND  LATERAL MENISECTOMIES;  Surgeon: Marchia Bond, MD;  Location: [facility];  Service: Orthopedics;  Laterality: Left;  . KNEE ARTHROSCOPY WITH MEDIAL MENISECTOMY Left 02/25/2016   Procedure: KNEE ARTHROSCOPY WITH MEDIAL MENISECTOMY;  Surgeon: Marchia Bond, MD;  Location: [facility];  Service: Orthopedics;  Laterality: Left;  . TONSILLECTOMY    . TYMPANOPLASTY Left   . TYMPANOSTOMY TUBE PLACEMENT     as child-both  . UPPER GASTROINTESTINAL ENDOSCOPY  2003, 2007   done by Dr. Velora Heckler   Family History  Problem Relation Age of Onset  . Hypertension Mother   . Stroke Mother   . Heart disease Mother   . Diabetes Mother   . Breast cancer Paternal Grandmother   . Kidney cancer Maternal Uncle   . Heart disease Maternal Uncle   . Diabetes Maternal Uncle   . Diabetes  Maternal Grandmother   . Heart disease Maternal Grandmother   . Emphysema Paternal Uncle        never smoker  . Emphysema Paternal Grandfather        never smoker  . Colon cancer Paternal Aunt        with met to lung   . Non-Hodgkin's lymphoma Maternal Uncle   . Heart disease Maternal Grandfather    Social History   Tobacco Use  . Smoking status: Never Smoker  . Smokeless tobacco: Never Used  Substance Use Topics  . Alcohol use: No    Alcohol/week: 0.0 standard drinks  . Drug use: No   Allergies  Allergen Reactions  . Diazepam     Triggers  migraines  . Divalproex Sodium Nausea And Vomiting    Causes light-headedness  . Gabapentin Nausea And Vomiting    confusion  . Ondansetron Hcl Nausea And Vomiting    Triggers migraines, "makes me vomit"  . Penicillins     REACTION: lips swollen to point of bleeding, sever vaginal irritation  Reports tolerating augmentin well without any complaints or reaction  . Pineapple   . Propoxyphene N-Acetaminophen Nausea And Vomiting  . Simvastatin     PATIENT CANNOT BE PRESCRIBED THIS STATIN WHILE RECEIVING NORVIR  . Topamax [Topiramate]     Migraines   . Tramadol Hcl Itching    REACTION: migraines and itching--hx of migraines even without medication   . Zocor [Simvastatin - High Dose]      Current Outpatient Medications:  .  albuterol (PROAIR HFA) 108 (90 Base) MCG/ACT inhaler, USE 1 TO 2 PUFFS EVERY 4-6 HOURS AS NEEDED FOR SHORTNESS OF BREATH ORWHEEZING, Disp: 8.5 g, Rfl: 1 .  amitriptyline (ELAVIL) 25 MG tablet, Take 1 tablet (41m) every night for 7 days then increase to 2 tablets (597m every night, Disp: 180 tablet, Rfl: 3 .  Beclomethasone Dipropionate 80 MCG/ACT AERS, Place 2 Inhalers into the nose daily., Disp: 8.7 g, Rfl: 10 .  blood glucose meter kit and supplies, Dispense based on patient and insurance preference. Use up to four times daily as directed. (FOR ICD-9 250.00, 250.01)., Disp: 1 each, Rfl: 0 .  BOTOX 100 units SOLR injection, FOR OFFICE ADMINISTRATION. PHYSICIAN TO INJECT UP TO 200 UNITS INTRAMUSCULARLY INTO HEAD, FACE AND NECK AREA EVERY 90 DAYS., Disp: 2 each, Rfl: 2 .  Calcium Carbonate-Vitamin D (CALCIUM-VITAMIN D) 500-200 MG-UNIT per tablet, Take 2 tablets by mouth daily. , Disp: , Rfl:  .  dapagliflozin propanediol (FARXIGA) 10 MG TABS tablet, Take 10 mg by mouth daily., Disp: 90 tablet, Rfl: 1 .  Diclofenac Potassium (CAMBIA) 50 MG PACK, Take 50 mg by mouth as needed., Disp: 12 each, Rfl: 11 .  diclofenac sodium (VOLTAREN) 1 % GEL, APPLY TOPICALLY TO AFFECTED  AREA 4 GRAMS4 TIMES DAILY, Disp: 100 g, Rfl: 11 .  DULoxetine (CYMBALTA) 60 MG capsule, Take 1 capsule (60 mg total) by mouth daily., Disp: 90 capsule, Rfl: 2 .  Erenumab-aooe (AIMOVIG) 140 MG/ML SOAJ, Inject 140 mg into the skin every 30 (thirty) days., Disp: 1 mL, Rfl: 11 .  fluconazole (DIFLUCAN) 100 MG tablet, TAKE 1 TABLET BY MOUTH EVERY DAY, Disp: 14 tablet, Rfl: 3 .  fluocinonide (LIDEX) 0.05 % external solution, 1 gtt AU qD prn itching, Disp: , Rfl:  .  glucose blood test strip, Use as instructed, Disp: 100 each, Rfl: 12 .  guaiFENesin-codeine 100-10 MG/5ML syrup, Take 5 mLs by mouth 3 (three) times daily as needed  for cough., Disp: 120 mL, Rfl: 0 .  lactulose (CHRONULAC) 10 GM/15ML solution, Take 30 mLs (20 g total) by mouth 2 (two) times daily., Disp: 1892 mL, Rfl: 6 .  lamoTRIgine (LAMICTAL) 100 MG tablet, Take 1 tablet (100 mg total) by mouth 2 (two) times daily., Disp: 60 tablet, Rfl: 11 .  lamoTRIgine (LAMICTAL) 25 MG tablet, 1 tab bid x one week 2 tab bid x 2nd week 3 tab bid x 3rd week, Disp: 84 tablet, Rfl: 0 .  liraglutide (VICTOZA) 18 MG/3ML SOPN, Inject 0.6 mg daily for 1 week, then increase to 1.2 mg daily, Disp: 3 pen, Rfl: 0 .  lisinopril (PRINIVIL,ZESTRIL) 10 MG tablet, TAKE 1 TABLET BY MOUTH EVERY DAY FOR BLOOD PRESSURE, Disp: 90 tablet, Rfl: 2 .  mometasone-formoterol (DULERA) 100-5 MCG/ACT AERO, USE 2 PUFFS BY MOUTH THE FIRST THING IN THE MORNING AND THEN ANOTHER 2 PUFFS ABOUT 12 HOURS LATER, Disp: 13 g, Rfl: 5 .  pantoprazole (PROTONIX) 40 MG tablet, TAKE ONE TABLET BY MOUTH TWICE A DAY FORACID REFLUX, Disp: 60 tablet, Rfl: 4 .  pravastatin (PRAVACHOL) 40 MG tablet, TAKE 1 TABLET BY MOUTH EVERY DAY FOR CHOLESTEROL, Disp: 90 tablet, Rfl: 1 .  scopolamine (TRANSDERM-SCOP, 1.5 MG,) 1 MG/3DAYS, PLACE 1 PATCH ON SKIN EVERY 3 DAYS, Disp: 10 patch, Rfl: 11 .  SYMTUZA 800-150-200-10 MG TABS, TAKE 1 TABLET BY MOUTH EVERY MORNING WITH BREAKFAST, Disp: 30 tablet, Rfl: 5 .   Tamsulosin HCl (FLOMAX) 0.4 MG CAPS, Take 0.4 mg by mouth daily. 1-2 daily, Disp: , Rfl:  .  terconazole (TERAZOL 7) 0.4 % vaginal cream, APPLY 1 APPLICATORFUL VAGINALLY EACH NIGHT AT BEDTIME, Disp: 45 g, Rfl: 0 .  tiZANidine (ZANAFLEX) 4 MG tablet, TAKE 1 TABLET BY MOUTH 2 TIMES A DAY AS NEEDED FOR MUSCLE SPASMS, Disp: 60 tablet, Rfl: 11 .  zolmitriptan (ZOMIG) 5 MG tablet, Take 1 tab at onset of migraine.  May repeat, with 1 tab, in 2 hrs, if needed.  Max dose of 2 tabs/24hrs.  Must last 30 days. #12/30, Disp: 12 tablet, Rfl: 11 .  zolpidem (AMBIEN) 10 MG tablet, TAKE 1 TABLET BY MOUTH EACH NIGHT AT BEDTIME AS NEEDED FOR SLEEP, Disp: 30 tablet, Rfl: 2  Current Facility-Administered Medications:  .  ketorolac (TORADOL) injection 60 mg, 60 mg, Intramuscular, Once, Marcial Pacas, MD     Review of Systems  Constitutional: Negative for activity change, appetite change, chills, fever and unexpected weight change.  HENT: Negative for congestion, facial swelling, postnasal drip, rhinorrhea, sinus pressure, sneezing, sore throat and trouble swallowing.   Eyes: Negative for photophobia and visual disturbance.  Respiratory: Negative for apnea, cough, chest tightness, shortness of breath, wheezing and stridor.   Cardiovascular: Negative for chest pain and leg swelling.  Gastrointestinal: Positive for nausea and vomiting. Negative for abdominal distention, anal bleeding and blood in stool.  Genitourinary: Negative for difficulty urinating, dysuria, flank pain and hematuria.  Musculoskeletal: Negative for joint swelling.  Skin: Positive for rash. Negative for color change, pallor and wound.  Neurological: Negative for dizziness, tremors and light-headedness.  Hematological: Negative for adenopathy. Does not bruise/bleed easily.  Psychiatric/Behavioral: Negative for agitation, behavioral problems, confusion, hallucinations, self-injury and suicidal ideas. The patient is not hyperactive.        Objective:    Physical Exam  Constitutional: She is oriented to person, place, and time. She appears well-developed and well-nourished. No distress.  HENT:  Head: Normocephalic and atraumatic.  Mouth/Throat: Oropharynx is clear and moist.  No oropharyngeal exudate, posterior oropharyngeal edema, posterior oropharyngeal erythema or tonsillar abscesses.  Eyes: Pupils are equal, round, and reactive to light. Conjunctivae and EOM are normal. No scleral icterus.  Neck: Normal range of motion. Neck supple. No JVD present.  Cardiovascular: Normal rate and regular rhythm.  Pulmonary/Chest: Effort normal. No respiratory distress. She has no wheezes.  Abdominal: She exhibits no distension.  Musculoskeletal:        General: No tenderness or edema.  Lymphadenopathy:    She has no cervical adenopathy.  Neurological: She is alert and oriented to person, place, and time. She has normal reflexes. She exhibits normal muscle tone. Coordination normal.  Skin: Skin is warm and dry. She is not diaphoretic. No erythema. No pallor.  Psychiatric: Her speech is normal and behavior is normal. Judgment and thought content normal. Cognition and memory are normal.  Nursing note and vitals reviewed.         Assessment & Plan:  HIV: continue  SYMTUZA. RTC in  One year  Migraines: followed closely by Neurology  Type 2 diabetes mellitus: followed by Dr Marisa Hua and Endocrinology  Obesity: she continues to try to lose weight through diet gery   Boils: She says that these responded to Keflex and asking for prescription for this which I have given.  I spent greater than 25 minutes with the patient including greater than 50% of time in face to face counsel of the patient and all of her labs including her viral load and CD4 count kidney function liver function review of nature of antiretroviral regimens and their evolution over time and in coordination of her care.

## 2019-04-23 LAB — COMPLETE METABOLIC PANEL WITH GFR
AG Ratio: 1.5 (calc) (ref 1.0–2.5)
ALT: 10 U/L (ref 6–29)
AST: 12 U/L (ref 10–35)
Albumin: 4.6 g/dL (ref 3.6–5.1)
Alkaline phosphatase (APISO): 80 U/L (ref 37–153)
BUN: 14 mg/dL (ref 7–25)
CO2: 28 mmol/L (ref 20–32)
Calcium: 10.1 mg/dL (ref 8.6–10.4)
Chloride: 95 mmol/L — ABNORMAL LOW (ref 98–110)
Creat: 0.81 mg/dL (ref 0.50–1.05)
GFR, Est African American: 97 mL/min/{1.73_m2} (ref >=60)
GFR, Est Non African American: 84 mL/min/{1.73_m2} (ref >=60)
Globulin: 3.1 g/dL (calc) (ref 1.9–3.7)
Glucose, Bld: 219 mg/dL — ABNORMAL HIGH (ref 65–99)
Potassium: 4.4 mmol/L (ref 3.5–5.3)
Sodium: 136 mmol/L (ref 135–146)
Total Bilirubin: 0.4 mg/dL (ref 0.2–1.2)
Total Protein: 7.7 g/dL (ref 6.1–8.1)

## 2019-05-01 ENCOUNTER — Other Ambulatory Visit: Payer: Self-pay | Admitting: Internal Medicine

## 2019-05-01 DIAGNOSIS — G4700 Insomnia, unspecified: Secondary | ICD-10-CM

## 2019-05-01 DIAGNOSIS — F330 Major depressive disorder, recurrent, mild: Secondary | ICD-10-CM

## 2019-05-01 DIAGNOSIS — M4316 Spondylolisthesis, lumbar region: Secondary | ICD-10-CM

## 2019-05-03 ENCOUNTER — Ambulatory Visit
Admission: RE | Admit: 2019-05-03 | Discharge: 2019-05-03 | Disposition: A | Discharge status: Home / Self Care | Payer: Medicaid Other | Source: Ambulatory Visit | Attending: Obstetrics and Gynecology | Admitting: Obstetrics and Gynecology

## 2019-05-03 ENCOUNTER — Other Ambulatory Visit: Payer: Self-pay

## 2019-05-03 DIAGNOSIS — Z1231 Encounter for screening mammogram for malignant neoplasm of breast: Secondary | ICD-10-CM

## 2019-05-06 ENCOUNTER — Other Ambulatory Visit: Payer: Self-pay | Admitting: Internal Medicine

## 2019-05-06 DIAGNOSIS — N644 Mastodynia: Secondary | ICD-10-CM

## 2019-05-29 ENCOUNTER — Other Ambulatory Visit: Payer: Self-pay | Admitting: Neurology

## 2019-05-31 ENCOUNTER — Other Ambulatory Visit: Payer: Self-pay | Admitting: Internal Medicine

## 2019-05-31 DIAGNOSIS — E118 Type 2 diabetes mellitus with unspecified complications: Secondary | ICD-10-CM

## 2019-06-03 ENCOUNTER — Other Ambulatory Visit: Payer: Self-pay | Admitting: Internal Medicine

## 2019-06-06 ENCOUNTER — Ambulatory Visit
Admission: RE | Admit: 2019-06-06 | Discharge: 2019-06-06 | Disposition: A | Discharge status: Home / Self Care | Payer: Medicaid Other | Source: Ambulatory Visit | Attending: Internal Medicine | Admitting: Internal Medicine

## 2019-06-06 ENCOUNTER — Other Ambulatory Visit: Payer: Self-pay

## 2019-06-06 DIAGNOSIS — N644 Mastodynia: Secondary | ICD-10-CM

## 2019-06-06 DIAGNOSIS — R928 Other abnormal and inconclusive findings on diagnostic imaging of breast: Secondary | ICD-10-CM | POA: Diagnosis not present

## 2019-06-06 DIAGNOSIS — N6489 Other specified disorders of breast: Secondary | ICD-10-CM | POA: Diagnosis not present

## 2019-06-08 ENCOUNTER — Other Ambulatory Visit: Payer: Self-pay | Admitting: Internal Medicine

## 2019-06-08 DIAGNOSIS — E118 Type 2 diabetes mellitus with unspecified complications: Secondary | ICD-10-CM

## 2019-06-10 ENCOUNTER — Telehealth: Payer: Self-pay | Admitting: *Deleted

## 2019-06-10 NOTE — Telephone Encounter (Signed)
-----   Message from Truman Hayward, MD sent at 06/10/2019  8:37 AM EST ----- Can we make sure [name] sees a Dermatologist

## 2019-06-10 NOTE — Telephone Encounter (Signed)
Please place referral with diagnosis for dermatology. Will 'cc Margaret for scheduling. Landis Gandy, RN

## 2019-06-11 ENCOUNTER — Other Ambulatory Visit: Payer: Self-pay | Admitting: Infectious Disease

## 2019-06-11 ENCOUNTER — Telehealth: Payer: Self-pay | Admitting: Internal Medicine

## 2019-06-11 DIAGNOSIS — F3341 Major depressive disorder, recurrent, in partial remission: Secondary | ICD-10-CM

## 2019-06-11 DIAGNOSIS — R21 Rash and other nonspecific skin eruption: Secondary | ICD-10-CM

## 2019-06-11 MED ORDER — SERTRALINE HCL 100 MG PO TABS: 100.0000 mg | ORAL_TABLET | Freq: Every day | ORAL | 3 refills | Status: DC

## 2019-06-11 NOTE — Telephone Encounter (Signed)
No problem.

## 2019-06-11 NOTE — Telephone Encounter (Signed)
Called patient she continues to have depression. She does not feel that she is responded to Cymbalta. We will discontinue this medication and switch her over to Zoloft 100 mg once daily. All questions and concerns addressed. She will follow-up with me in January.

## 2019-06-11 NOTE — Telephone Encounter (Signed)
Called patient to discuss dermatology referral for a rash on the nipple. Dr. Tommy Medal has already placed the referral. All questions and concerns addressed.

## 2019-06-25 ENCOUNTER — Other Ambulatory Visit: Payer: Self-pay | Admitting: Infectious Disease

## 2019-06-25 ENCOUNTER — Other Ambulatory Visit: Payer: Self-pay | Admitting: Internal Medicine

## 2019-06-25 DIAGNOSIS — I10 Essential (primary) hypertension: Secondary | ICD-10-CM

## 2019-07-08 DIAGNOSIS — M47816 Spondylosis without myelopathy or radiculopathy, lumbar region: Secondary | ICD-10-CM | POA: Diagnosis not present

## 2019-07-08 DIAGNOSIS — G894 Chronic pain syndrome: Secondary | ICD-10-CM | POA: Diagnosis not present

## 2019-07-08 DIAGNOSIS — G43709 Chronic migraine without aura, not intractable, without status migrainosus: Secondary | ICD-10-CM | POA: Diagnosis not present

## 2019-07-08 DIAGNOSIS — R531 Weakness: Secondary | ICD-10-CM | POA: Diagnosis not present

## 2019-07-18 ENCOUNTER — Ambulatory Visit: Payer: Medicaid Other | Admitting: Dietician

## 2019-07-18 ENCOUNTER — Ambulatory Visit: Payer: Self-pay | Admitting: Neurology

## 2019-07-18 ENCOUNTER — Other Ambulatory Visit: Payer: Self-pay | Admitting: Dietician

## 2019-07-18 ENCOUNTER — Ambulatory Visit: Payer: Medicaid Other | Admitting: Internal Medicine

## 2019-07-18 ENCOUNTER — Encounter: Payer: Self-pay | Admitting: Dietician

## 2019-07-18 ENCOUNTER — Other Ambulatory Visit: Payer: Self-pay

## 2019-07-18 VITALS — BP 148/86 | HR 96 | Temp 98.6°F | Ht 66.0 in | Wt 284.1 lb

## 2019-07-18 DIAGNOSIS — Z7984 Long term (current) use of oral hypoglycemic drugs: Secondary | ICD-10-CM | POA: Diagnosis not present

## 2019-07-18 DIAGNOSIS — K5909 Other constipation: Secondary | ICD-10-CM

## 2019-07-18 DIAGNOSIS — J329 Chronic sinusitis, unspecified: Secondary | ICD-10-CM

## 2019-07-18 DIAGNOSIS — F3341 Major depressive disorder, recurrent, in partial remission: Secondary | ICD-10-CM

## 2019-07-18 DIAGNOSIS — G4700 Insomnia, unspecified: Secondary | ICD-10-CM

## 2019-07-18 DIAGNOSIS — E119 Type 2 diabetes mellitus without complications: Secondary | ICD-10-CM

## 2019-07-18 DIAGNOSIS — I10 Essential (primary) hypertension: Secondary | ICD-10-CM

## 2019-07-18 DIAGNOSIS — G43719 Chronic migraine without aura, intractable, without status migrainosus: Secondary | ICD-10-CM | POA: Diagnosis not present

## 2019-07-18 DIAGNOSIS — K5900 Constipation, unspecified: Secondary | ICD-10-CM

## 2019-07-18 DIAGNOSIS — Z79899 Other long term (current) drug therapy: Secondary | ICD-10-CM | POA: Diagnosis not present

## 2019-07-18 DIAGNOSIS — F339 Major depressive disorder, recurrent, unspecified: Secondary | ICD-10-CM

## 2019-07-18 DIAGNOSIS — J45909 Unspecified asthma, uncomplicated: Secondary | ICD-10-CM

## 2019-07-18 LAB — POCT GLYCOSYLATED HEMOGLOBIN (HGB A1C): Hemoglobin A1C: 8 % — AB (ref 4.0–5.6)

## 2019-07-18 LAB — GLUCOSE, CAPILLARY: Glucose-Capillary: 183 mg/dL — ABNORMAL HIGH (ref 70–99)

## 2019-07-18 LAB — HM DIABETES EYE EXAM

## 2019-07-18 MED ORDER — LISINOPRIL 20 MG PO TABS: 20.0000 mg | ORAL_TABLET | Freq: Every day | ORAL | 0 refills | Status: DC

## 2019-07-18 MED ORDER — GUAIFENESIN-CODEINE 100-10 MG/5ML PO SOLN: 5.0000 mL | Freq: Three times a day (TID) | ORAL | 0 refills | Status: DC | PRN

## 2019-07-18 MED ORDER — ZOLPIDEM TARTRATE 10 MG PO TABS: ORAL_TABLET | ORAL | 3 refills | Status: DC

## 2019-07-18 NOTE — Progress Notes (Signed)
Retinal images were done and transmitted today.  

## 2019-07-18 NOTE — Patient Instructions (Signed)
You for allowing me to provide your care. I have sent refills to your pharmacy. Continue to work on modifying your diet to help control your diabetes. Would like to see you back in May for further evaluation and management.

## 2019-07-18 NOTE — Assessment & Plan Note (Signed)
Continues to have daily chronic migraines. She is on multiple medications for this. She follows with [facility].

## 2019-07-18 NOTE — Assessment & Plan Note (Signed)
Uncontrolled hypertension on lisinopril 10 mg once daily. States that all all her visits she is consistently above 140/80. He is tolerating lisinopril without any adverse events. Denies orthostatic symptoms. Last BMP reviewed with normal renal function and electrolytes. Will increase lisinopril 20 mg once daily. She will follow-up in four weeks for repeat BMP.

## 2019-07-18 NOTE — Assessment & Plan Note (Signed)
Patient with chronic insomnia. She uses Ambien as a chronic medication. We continue to discuss the risks/benefits.

## 2019-07-18 NOTE — Assessment & Plan Note (Signed)
>>  ASSESSMENT AND PLAN FOR TYPE 2 DIABETES MELLITUS (HCC) WRITTEN ON 07/18/2019  2:51 PM BY HELBERG, JUSTIN, MD  Patient with uncontrolled diabetes. A1c today .0. She is currently on Farxiga 10 mg once daily and Victoza 1.8 mg QD. He is taking all these medications as prescribed. She continues to have some nausea but states that is significantly improved. Her sugars are typically between 120 and 180. She has had no hypoglycemia. She states that she is been drinking a lot of orange juice, milk, and pudding. She knows that she needs to clean up her diet. She does not exercise.  A/P: - Continue Farxiga 10 mg once daily and Victoza 1.8 mg QD - Discussed dietary modifications

## 2019-07-18 NOTE — Assessment & Plan Note (Signed)
Patient with uncontrolled diabetes. A1c today .0. She is currently on Farxiga 10 mg once daily and Victoza 1.8 mg QD. He is taking all these medications as prescribed. She continues to have some nausea but states that is significantly improved. Her sugars are typically between 120 and 180. She has had no hypoglycemia. She states that she is been drinking a lot of orange juice, milk, and pudding. She knows that she needs to clean up her diet. She does not exercise.  A/P: - Continue Farxiga 10 mg once daily and Victoza 1.8 mg QD - Discussed dietary modifications

## 2019-07-18 NOTE — Progress Notes (Signed)
   CC: DM, HTN, Depression, Sinusitis, Constipation, Insomnia   HPI:  [name] is a 54 y.o. female with PMHx listed below presenting for DM, HTN, Depression, Sinusitis, Constipation, Insomnia. Please see the A&P for the status of the patient's chronic medical problems.  Past Medical History:  Diagnosis Date  . Acute gastritis without mention of hemorrhage 01/01/2002  . Anemia 10/2009   macrocytic anemia with baseline MCV 104-106  . Anxiety   . Asthma   . Boil 01/05/2017  . 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 Complex tear of medial meniscus of left knee as current injury 02/25/2016  . Complication of anesthesia    hard to wake up  . COPD (chronic obstructive pulmonary disease) (HCC)    chronic bronchitis  . Derangement of anterior horn of lateral meniscus of left knee 02/25/2016  . Duodenitis without mention of hemorrhage 01/01/2002  . DVT (deep venous thrombosis) (HCC)    history clot lt groin when she had lymphoma-in remision now.  . Elevated triglycerides with high cholesterol 01/05/2017  . Gastroparesis   . GERD (gastroesophageal reflux disease)   . Hiatal hernia 01/01/2002  . HIV infection ([place])    CD4 = 570 (05/2010), VL undetectation  . Hyperlipidemia   . IBS (irritable bowel syndrome)   . Migraines    on topamax and triptans, frequent (almost daily) attacks   . MRSA (methicillin resistant staph aureus) culture positive 12/20/2010  . 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 Beryle Beams is her oncologist.   . PUD (peptic ulcer disease) 07/1997   per EGD report 07/1997 with history of esophagitis  . Restless leg syndrome 12/24/2015  . Shingles    in lumbar dermatome  . Spondylolisthesis of lumbar region    L5-S1  . TMJ (dislocation of temporomandibular joint)   . Traumatic tear of lateral meniscus of right knee 04/20/2012  . Vaginal yeast infection 09/25/2017   Review of Systems:  Performed and all others  negative.  Physical Exam: Vitals:   07/18/19 1347  BP: (!) 148/86  Pulse: 96  Temp: 98.6 F (37 C)  TempSrc: Oral  SpO2: 98%  Weight: 284 lb 1.6 oz (128.9 kg)  Height: 5\' 6"  (1.676 m)   General: Obese female in no acute distress HENT: Normocephalic, atraumatic, moist mucus membranes Pulm: Good air movement with no wheezing or crackles  CV: RRR, no murmurs, no rubs  Abdomen: Active bowel sounds, soft, non-distended, no tenderness to palpation  Extremities: Pulses palpable in all extremities, no LE edema  Skin: Warm and dry  Neuro: Alert and oriented x 3  Assessment & Plan:   See Encounters Tab for problem based charting.  Patient discussed with Dr. Philipp Ovens

## 2019-07-18 NOTE — Progress Notes (Signed)
Internal Medicine Clinic Attending  Case discussed with Dr. Helberg at the time of the visit.  We reviewed the resident's history and exam and pertinent patient test results.  I agree with the assessment, diagnosis, and plan of care documented in the resident's note.    

## 2019-07-18 NOTE — Assessment & Plan Note (Signed)
With uncontrolled depression. PHQ nine is over 20 today. She is currently on sertraline 100 mg once daily and amitriptyline 25 mg once daily. She is been on multiple medications in the past including Cymbalta, Prozac, Wellbutrin, BuSpar. She is not interested in counseling at this point. She has had a lot of stress with her family. She denies SI or HI.  Medication options limited. She will follow-up with psychiatry, [facility]. In the meantime she will continue sertraline 100 mg daily and amitriptyline 25 mg daily.

## 2019-07-18 NOTE — Assessment & Plan Note (Signed)
Patient with chronic constipation. She is been on multiple medications. She is currently taking lactulose. At her last visit she was referred to [facility] She has not established with them yet. She will reach out to their office.

## 2019-07-19 ENCOUNTER — Other Ambulatory Visit: Payer: Self-pay | Admitting: Obstetrics and Gynecology

## 2019-07-19 LAB — CBC
Hematocrit: 46.2 % (ref 34.0–46.6)
Hemoglobin: 14.9 g/dL (ref 11.1–15.9)
MCH: 26.7 pg (ref 26.6–33.0)
MCHC: 32.3 g/dL (ref 31.5–35.7)
MCV: 83 fL (ref 79–97)
Platelets: 246 10*3/uL (ref 150–450)
RBC: 5.58 x10E6/uL — ABNORMAL HIGH (ref 3.77–5.28)
RDW: 14.7 % (ref 11.7–15.4)
WBC: 8.3 10*3/uL (ref 3.4–10.8)

## 2019-07-24 ENCOUNTER — Encounter: Payer: Self-pay | Admitting: Dietician

## 2019-07-24 ENCOUNTER — Other Ambulatory Visit: Payer: Self-pay | Admitting: Internal Medicine

## 2019-07-24 DIAGNOSIS — E118 Type 2 diabetes mellitus with unspecified complications: Secondary | ICD-10-CM

## 2019-08-05 ENCOUNTER — Telehealth: Payer: Self-pay | Admitting: Neurology

## 2019-08-05 NOTE — Telephone Encounter (Signed)
Noted, thank you

## 2019-08-05 NOTE — Telephone Encounter (Signed)
08/07/2018 Botox TBD Jayme Cloud -- PLease make Danielle aware so she can swap [place] [facility]

## 2019-08-06 DIAGNOSIS — M47816 Spondylosis without myelopathy or radiculopathy, lumbar region: Secondary | ICD-10-CM | POA: Diagnosis not present

## 2019-08-06 DIAGNOSIS — M5416 Radiculopathy, lumbar region: Secondary | ICD-10-CM | POA: Diagnosis not present

## 2019-08-07 ENCOUNTER — Ambulatory Visit: Payer: Medicaid Other | Admitting: Neurology

## 2019-08-07 ENCOUNTER — Encounter: Payer: Self-pay | Admitting: Neurology

## 2019-08-07 ENCOUNTER — Other Ambulatory Visit: Payer: Self-pay

## 2019-08-07 VITALS — BP 151/79 | HR 96 | Temp 97.3°F | Ht 66.0 in | Wt 286.0 lb

## 2019-08-07 DIAGNOSIS — G43719 Chronic migraine without aura, intractable, without status migrainosus: Secondary | ICD-10-CM

## 2019-08-07 MED ORDER — ZOLMITRIPTAN 5 MG PO TABS: ORAL_TABLET | ORAL | 11 refills | Status: DC

## 2019-08-07 MED ORDER — PROMETHAZINE HCL 25 MG RE SUPP: 25.0000 mg | Freq: Four times a day (QID) | RECTAL | 11 refills | Status: DC | PRN

## 2019-08-07 NOTE — Progress Notes (Signed)
**  Botox 100 units x 2 vials, NDC [id number], Lot [id number], Exp 04/2022, specialty pharmacy.//mck,rn**

## 2019-08-07 NOTE — Progress Notes (Signed)
PATIENT: [name] DOB: Dec 19, 1965  Chief Complaint  Patient presents with  . Migraine    Botox 100 units x 2 vials - specialty pharmacy. She titrated up her Lamictal to 170m BID. She did not find it to helpful and stopped it.     HISTORICAL  [name] Is accompanied by her sister returned for Botox May as migraine prevention  She has past medical history of HIV, hyperlipidemia, migraines, depression, and non-Hodgkin lymphoma in 2005 with chemotherapy and radiation.   She has history of migraines since 19. In the last 10 years, she reports having daily migraines. She is currently taking Topamax and Zomig with relief. She takes approximately 20-25 tablets of her Zomig per month.    She has aura's that look like floating dirt particles, flashing lights, and she sees Christmas tree lights when they are severe, she has nausea, vomiting and sometimes diarrhea when they're severe. She also complains of diplopia, blurry vision, and  photophobia. She also has had a lightning bolt sensation throughout her body.  She describes them as pulsating and throbbing  on a scale of 5-6/10 which she considers mild, when they are severe they can go as high as 10/10. They usually start in the right temporal area, but will also start on the left temporal and are worse.   Any type of smoke or MSG can precipitate a migraine. They generally last 2-9 days. She has been to the emergency department twice in the last year for migraines. She states "I just can't do this anymore I need to do something about her migraines"  She has tried and failed different preventive medications:  atenolol, verapamil, nortriptyline, Inderal, Depakote without benefit. Atenolol and verapamil caused hypotension. Depakote caused severe vomiting. Topamax causing her worsening headaches, will show tried Zonegran, Topamax ER, she denies significant improvement.  For abortive treatment, she has tried Imitrex, Maxalt with  suboptimal response, Zomig as needed since to work best,  she also takes transderm scophalamine patch for nause  I started to give her BOTOX May since May 2013, every 3 months, she responded very well,   Previous MRI brain was normal in 2013.  Last Botox May as migraine prevention was March 2017, she responded very well, but in recent few weeks, she noticed increased migraine headaches, this baby had headache left retro-orbital area severe pounding headache with associated light noise sensitivity, has been ongoing for 3 days, failed to respond to Zomig by mouth, Imitrex May as needed, she has constant dry heaves, difficulty opening her eyes, In addition, today she complains of restless leg symptoms, difficult to stand or lying still for extended period of time, has tried Neurontin-cause nausea, confusion,  Lyrica-cause excessive weight gain, she has never tried dopamine agonist in the past  I reviewed laboratory in April 2017, normal hemoglobin 12 point 9, mild elevated RDW 15.8, decreased MCV  Update April 27 2016  Last Botox May was in June 2017, she had left knee arthroscopic surgery in August 2017, complaining of significant joints pain, unsteady gait, before Botox she was having moderate to severe headache daily, even Botox now, she used to 12 tablets of Zomig every months because of frequent moderate to severe headache  Update August 10 2016: Last Botox May was in October 2017, has been very helpful, but over the past 3-4 weeks, she had frequent almost daily headaches, oftentimes wake her up from sleep, Zomig has been very helpful,  Low dose Mirapex 0.25 milligrams 2  tablets every night has been very helpful for her restless leg symptoms, she reported 80% improvement, laboratory reviewed in January 2018, CD4 count 760, normal hemoglobin 12.3, mildly decreased MCV, increased RDW,  Update Nov 09 2016: She suffered bilateral ear infection, strep throat since  last visit, had frequent headaches, nausea,  Update February 22 2017: She responded well to previous May, complains of a lot of stress at home  UPDATE May 31 2017: She complains suboptimal response to Botox May, had daily headache over last week, also complains of a lot of stress at home, average more than 4 headaches in 1 week, lasting all day, with associated nausea vomiting, is taking Zomig as needed, with partial response  UPDATE September 08 2017: She complains of excessive stress, frequent migraine headaches,  UPDATE December 20 2017: She is accompanied by her sister at today's clinical visit, [name] has helped her migraine, she rated as 25%, but with the combination of Sherry and Botox May, she still has 3 migraine headaches each week, has been dealing with daily headaches over the past 1 week, required multiple dose of Zomig treatment  UPDATE Sept 25 2019: She responded well to previous May, today she complains of 2 weeks history of severe headaches failed home remedy treatment, multiple dose of Zomig,  She is also getting [name] every month, which has been helpful,  UPDATE Jun 21 2018: she overall responding well to Botox, Sherry, also taking amitriptyline 25 mg 2 tablets every night, Zomig as needed for abortive treatment, couple headaches each week, usually responding well to triptan treatment, but this particular headache been ongoing for 2 weeks, tried and failed multiple home medications.  UPDATE September 20 2018: She responded well to previous May, but at the end of May cycle,She would have more frequent prolonged headaches, she used Zomig as needed,  UPDATE December 19 2018: She responded very well to previous May.  UPDATE Apr 17 2019: She is with her sister at today's visit, she reported a lot of stress at home, has increased frequency of migraines, almost daily basis over the past few weeks, she also suffered depression  anxiety  UPDATE Aug 07 2019: She continues to have frequent headaches at home.  Lamotrigine and Elavil were not helpful, she has stopped taking them, Botox May along with aimovig were very helpful.  REVIEW OF SYSTEMS: Full 14 system review of systems performed and notable only for as above  ALLERGIES: Allergies  Allergen Reactions  . Diazepam     Triggers migraines  . Divalproex Sodium Nausea And Vomiting    Causes light-headedness  . Gabapentin Nausea And Vomiting    confusion  . Ondansetron Hcl Nausea And Vomiting    Triggers migraines, "makes me vomit"  . Penicillins     REACTION: lips swollen to point of bleeding, sever vaginal irritation  Reports tolerating augmentin well without any complaints or reaction  . Pineapple   . Propoxyphene N-Acetaminophen Nausea And Vomiting  . Simvastatin     PATIENT CANNOT BE PRESCRIBED THIS STATIN WHILE RECEIVING NORVIR  . Topamax [Topiramate]     Migraines   . Tramadol Hcl Itching    REACTION: migraines and itching--hx of migraines even without medication   . Zocor [Simvastatin - High Dose]     HOME MEDICATIONS: Current Outpatient Medications  Medication Sig Dispense Refill  . albuterol (PROAIR HFA) 108 (90 Base) MCG/ACT inhaler USE 1 TO 2 PUFFS EVERY 4-6 HOURS AS NEEDED FOR SHORTNESS OF BREATH ORWHEEZING 8.5 g  1  . Beclomethasone Dipropionate 80 MCG/ACT AERS Place 2 Inhalers into the nose daily. 8.7 g 10  . blood glucose meter kit and supplies Dispense based on patient and insurance preference. Use up to four times daily as directed. (FOR ICD-9 250.00, 250.01). 1 each 0  . BOTOX 100 units SOLR May FOR OFFICE ADMINISTRATION. PHYSICIAN TO INJECT UP TO 200 UNITS INTRAMUSCULARLY INTO HEAD, FACE AND NECK AREA EVERY 90 DAYS. 2 each 2  . Calcium Carbonate-Vitamin D (CALCIUM-VITAMIN D) 500-200 MG-UNIT per tablet Take 2 tablets by mouth daily.     . diclofenac sodium (VOLTAREN) 1 % GEL APPLY TOPICALLY TO AFFECTED AREA 4 GRAMS4 TIMES  DAILY 100 g 11  . diclofenac Sodium (VOLTAREN) 1 % GEL APPLY TOPICALLY TO AFFECTED AREA 4 GRAMS4 TIMES DAILY 100 g 2  . Erenumab-aooe (AIMOVIG) 140 MG/ML SOAJ Inject 140 mg into the skin every 30 (thirty) days. 1 mL 11  . FARXIGA 10 MG TABS tablet TAKE 1 TABLET BY MOUTH EVERY DAY 90 tablet 3  . fluconazole (DIFLUCAN) 100 MG tablet TAKE 1 TABLET BY MOUTH EVERY DAY 14 tablet 3  . fluocinonide (LIDEX) 0.05 % external solution 1 gtt AU qD prn itching    . glucose blood test strip Use as instructed 100 each 12  . guaiFENesin-codeine 100-10 MG/5ML syrup Take 5 mLs by mouth 3 (three) times daily as needed for cough. 120 mL 0  . liraglutide (VICTOZA) 18 MG/3ML SOPN Inject 0.6 mg daily for 1 week, then increase to 1.2 mg daily 3 pen 0  . lisinopril (ZESTRIL) 20 MG tablet Take 1 tablet (20 mg total) by mouth daily. 90 tablet 0  . mometasone-formoterol (DULERA) 100-5 MCG/ACT AERO USE 2 PUFFS BY MOUTH THE FIRST THING IN THE MORNING AND THEN ANOTHER 2 PUFFS ABOUT 12 HOURS LATER 13 g 5  . pantoprazole (PROTONIX) 40 MG tablet TAKE 1 TABLET BY MOUTH 2 TIMES A DAY FORACID REFLUX 60 tablet 4  . pravastatin (PRAVACHOL) 40 MG tablet TAKE 1 TABLET BY MOUTH EVERY DAY FOR CHOLESTEROL 90 tablet 1  . scopolamine (TRANSDERM-SCOP, 1.5 MG,) 1 MG/3DAYS PLACE 1 PATCH ON SKIN EVERY 3 DAYS 10 patch 11  . sertraline (ZOLOFT) 100 MG tablet Take 1 tablet (100 mg total) by mouth daily. 90 tablet 3  . Tamsulosin HCl (FLOMAX) 0.4 MG CAPS Take 0.4 mg by mouth daily. 1-2 daily    . tiZANidine (ZANAFLEX) 4 MG tablet TAKE 1 TABLET BY MOUTH 2 TIMES A DAY AS NEEDED FOR MUSCLE SPASMS 60 tablet 11  . zolmitriptan (ZOMIG) 5 MG tablet Take 1 tab at onset of migraine.  May repeat, with 1 tab, in 2 hrs, if needed.  Max dose of 2 tabs/24hrs.  Must last 30 days. #12/30 12 tablet 11  . zolpidem (AMBIEN) 10 MG tablet TAKE 1 TABLET BY MOUTH EVERY NIGHT AT BEDTIME AS NEEDED FOR SLEEP 30 tablet 3   No current facility-administered medications for this  visit.    PAST MEDICAL HISTORY: Past Medical History:  Diagnosis Date  . Acute gastritis without mention of hemorrhage 01/01/2002  . Anemia 10/2009   macrocytic anemia with baseline MCV 104-106  . Anxiety   . Asthma   . Boil 01/05/2017  . 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 Complex tear of medial meniscus of left knee as current injury 02/25/2016  . Complication of anesthesia    hard to wake up  . COPD (chronic obstructive pulmonary disease) (HCC)    chronic bronchitis  .  Derangement of anterior horn of lateral meniscus of left knee 02/25/2016  . Duodenitis without mention of hemorrhage 01/01/2002  . DVT (deep venous thrombosis) (HCC)    history clot lt groin when she had lymphoma-in remision now.  . Elevated triglycerides with high cholesterol 01/05/2017  . Gastroparesis   . GERD (gastroesophageal reflux disease)   . Hiatal hernia 01/01/2002  . HIV infection ([place])    CD4 = 570 (05/2010), VL undetectation  . Hyperlipidemia   . IBS (irritable bowel syndrome)   . Migraines    on topamax and triptans, frequent (almost daily) attacks   . MRSA (methicillin resistant staph aureus) culture positive 12/20/2010  . Non Hodgkin's lymphoma ([place])    stage II, s/p resection, chemotherapy, radiotherapy, Dr. Beryle Beams is her oncologist.   . PUD (peptic ulcer disease) 07/1997   per EGD report 07/1997 with history of esophagitis  . Restless leg syndrome 12/24/2015  . Shingles    in lumbar dermatome  . Spondylolisthesis of lumbar region    L5-S1  . TMJ (dislocation of temporomandibular joint)   . Traumatic tear of lateral meniscus of right knee 04/20/2012  . Vaginal yeast infection 09/25/2017    PAST SURGICAL HISTORY: Past Surgical History:  Procedure Laterality Date  . CHOLECYSTECTOMY    . ENDOMETRIAL ABLATION    . KNEE ARTHROSCOPY Right 04/20/12  . KNEE ARTHROSCOPY WITH LATERAL MENISECTOMY Left 02/25/2016   Procedure: LEFT KNEE ARTHROSCOPY WITH MEDIAL AND  LATERAL MENISECTOMIES;   Surgeon: Marchia Bond, MD;  Location: [facility];  Service: Orthopedics;  Laterality: Left;  . KNEE ARTHROSCOPY WITH MEDIAL MENISECTOMY Left 02/25/2016   Procedure: KNEE ARTHROSCOPY WITH MEDIAL MENISECTOMY;  Surgeon: Marchia Bond, MD;  Location: [facility];  Service: Orthopedics;  Laterality: Left;  . TONSILLECTOMY    . TYMPANOPLASTY Left   . TYMPANOSTOMY TUBE PLACEMENT     as child-both  . UPPER GASTROINTESTINAL ENDOSCOPY  2003, 2007   done by Dr. Velora Heckler    FAMILY HISTORY: Family History  Problem Relation Age of Onset  . Hypertension Mother   . Stroke Mother   . Heart disease Mother   . Diabetes Mother   . Breast cancer Paternal Grandmother   . Kidney cancer Maternal Uncle   . Heart disease Maternal Uncle   . Diabetes Maternal Uncle   . Diabetes Maternal Grandmother   . Heart disease Maternal Grandmother   . Emphysema Paternal Uncle        never smoker  . Emphysema Paternal Grandfather        never smoker  . Colon cancer Paternal Aunt        with met to lung   . Non-Hodgkin's lymphoma Maternal Uncle   . Heart disease Maternal Grandfather     SOCIAL HISTORY:  Social History   Socioeconomic History  . Marital status: Divorced    Spouse name: Not on file  . Number of children: 0  . Years of education: 42  . Highest education level: Not on file  Occupational History  . Occupation: disabled    Fish farm manager: UNEMPLOYED  Tobacco Use  . Smoking status: Never Smoker  . Smokeless tobacco: Never Used  Substance and Sexual Activity  . Alcohol use: No    Alcohol/week: 0.0 standard drinks  . Drug use: No  . Sexual activity: Not Currently    Partners: Male    Birth control/protection: Surgical  Other Topics Concern  . Not on file  Social History Narrative  Patient is disabled.   Right handed.   Caffeine - Diet soda., tea   Education - high school   Social Determinants of Health   Financial Resource Strain:   . Difficulty of Paying  Living Expenses: Not on file  Food Insecurity:   . Worried About Charity fundraiser in the Last Year: Not on file  . Ran Out of Food in the Last Year: Not on file  Transportation Needs:   . Lack of Transportation (Medical): Not on file  . Lack of Transportation (Non-Medical): Not on file  Physical Activity:   . Days of Exercise per Week: Not on file  . Minutes of Exercise per Session: Not on file  Stress:   . Feeling of Stress : Not on file  Social Connections:   . Frequency of Communication with Friends and Family: Not on file  . Frequency of Social Gatherings with Friends and Family: Not on file  . Attends Religious Services: Not on file  . Active Member of Clubs or Organizations: Not on file  . Attends Archivist Meetings: Not on file  . Marital Status: Not on file  Intimate Partner Violence:   . Fear of Current or Ex-Partner: Not on file  . Emotionally Abused: Not on file  . Physically Abused: Not on file  . Sexually Abused: Not on file     PHYSICAL EXAM   Vitals:   08/07/19 1408  BP: (!) 151/79  Pulse: 96  Temp: (!) 97.3 F (36.3 C)  Weight: 286 lb (129.7 kg)  Height: _0  (1.676 m)    Not recorded      Body mass index is 46.16 kg/m.  DIAGNOSTIC DATA (LABS, IMAGING, TESTING) - I reviewed patient records, labs, notes, testing and imaging myself where available.   ASSESSMENT AND PLAN  ELANORE TALCOTT is a 54 y.o. female    History of HIV, hypercholesterolemia, Non hodgkin' lymphoma with chemotherapy and radiation and migraines since age 78.   Chronic migraine headaches She has tried and failed multiple preventive medications including atenolol, verapamil, nortriptyline, and Depakote,Topamax, Topamax ER, zonegran could not tolerate Cardizem,  She has migraines daily for more than  10 years. She is currently taking amitriptyline 25 mg 2 tablets every day and Zomig with some relief. Previously tried imitrex and Maxalt with suboptimal response,  Normal  MRI brain, and neurological exam.  Botox May for chronic migraine prevention, May was performed according to [place] protocol,  5 units of Botox was injected into each side, for 31 May sites, total of 155 units  Bilateral temporalis 8 May sites Bilateral occipitalis 6 May sites Bilateral cervical paraspinals 10 May sites Bilateral upper trapezius 6 May sites  Extra 50 unites were injected into bilateral parietal temporal, and masseters muscles   Continue aimovig 140 mg every months as preventive medications She has stopped lamotrigine, Elavil, Zomig, Cambia as needed, May add on Phenergan tizanidine 4 mg as needed for prolonged severe headaches,      Marcial Pacas, M.D. Ph.D.  [facility] [address] [place], [place] [address] Ph: [phone] Fax: [phone]  CC: Referring Provider

## 2019-08-15 ENCOUNTER — Encounter: Payer: Medicaid Other | Admitting: Internal Medicine

## 2019-08-21 ENCOUNTER — Other Ambulatory Visit: Payer: Self-pay | Admitting: Neurology

## 2019-08-21 ENCOUNTER — Other Ambulatory Visit: Payer: Self-pay | Admitting: Internal Medicine

## 2019-08-21 DIAGNOSIS — I10 Essential (primary) hypertension: Secondary | ICD-10-CM

## 2019-08-22 ENCOUNTER — Encounter: Payer: Medicaid Other | Admitting: Internal Medicine

## 2019-08-26 ENCOUNTER — Ambulatory Visit: Payer: Medicaid Other | Admitting: Gastroenterology

## 2019-08-27 ENCOUNTER — Telehealth: Payer: Self-pay | Admitting: *Deleted

## 2019-08-27 NOTE — Telephone Encounter (Signed)
Patient called for covid vaccine information. RN directed patient to [email or web address] for vaccine updates and opportunities. Landis Gandy, RN

## 2019-08-29 ENCOUNTER — Ambulatory Visit (INDEPENDENT_AMBULATORY_CARE_PROVIDER_SITE_OTHER): Payer: Medicaid Other | Admitting: Internal Medicine

## 2019-08-29 ENCOUNTER — Other Ambulatory Visit: Payer: Self-pay

## 2019-08-29 DIAGNOSIS — E538 Deficiency of other specified B group vitamins: Secondary | ICD-10-CM

## 2019-08-29 DIAGNOSIS — R251 Tremor, unspecified: Secondary | ICD-10-CM

## 2019-08-29 DIAGNOSIS — R112 Nausea with vomiting, unspecified: Secondary | ICD-10-CM | POA: Diagnosis not present

## 2019-08-29 DIAGNOSIS — J45909 Unspecified asthma, uncomplicated: Secondary | ICD-10-CM

## 2019-08-29 NOTE — Progress Notes (Signed)
   CC: Tremor, cough, vomiting  This is a telephone encounter between [name] and [name] on 08/29/2019 for Tremor, cough, vomiting. The visit was conducted with the patient located at home and [facility]. The patient's identity was confirmed using their DOB and current address. The patient has consented to being evaluated through a telephone encounter and understands the associated risks (an examination cannot be done and the patient may need to come in for an appointment) / benefits (allows the patient to remain at home, decreasing exposure to coronavirus). I personally spent 27 minutes on medical discussion.   HPI:  [name] is a 54 y.o. with PMH as below.   Please see A&P for assessment of the patient's acute and chronic medical conditions.   Past Medical History:  Diagnosis Date  . Acute gastritis without mention of hemorrhage 01/01/2002  . Anemia 10/2009   macrocytic anemia with baseline MCV 104-106  . Anxiety   . Asthma   . Boil 01/05/2017  . 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 Complex tear of medial meniscus of left knee as current injury 02/25/2016  . Complication of anesthesia    hard to wake up  . COPD (chronic obstructive pulmonary disease) (HCC)    chronic bronchitis  . Derangement of anterior horn of lateral meniscus of left knee 02/25/2016  . Duodenitis without mention of hemorrhage 01/01/2002  . DVT (deep venous thrombosis) (HCC)    history clot lt groin when she had lymphoma-in remision now.  . Elevated triglycerides with high cholesterol 01/05/2017  . Gastroparesis   . GERD (gastroesophageal reflux disease)   . Hiatal hernia 01/01/2002  . HIV infection ([place])    CD4 = 570 (05/2010), VL undetectation  . Hyperlipidemia   . IBS (irritable bowel syndrome)   . Migraines    on topamax and triptans, frequent (almost daily) attacks   . MRSA (methicillin resistant staph aureus) culture positive 12/20/2010  . Non Hodgkin's lymphoma ([place])    stage  II, s/p resection, chemotherapy, radiotherapy, Dr. Beryle Beams is her oncologist.   . PUD (peptic ulcer disease) 07/1997   per EGD report 07/1997 with history of esophagitis  . Restless leg syndrome 12/24/2015  . Shingles    in lumbar dermatome  . Spondylolisthesis of lumbar region    L5-S1  . TMJ (dislocation of temporomandibular joint)   . Traumatic tear of lateral meniscus of right knee 04/20/2012  . Vaginal yeast infection 09/25/2017   Review of Systems:  Performed and all others negative.  Assessment & Plan:   See Encounters Tab for problem based charting.  Patient discussed with Dr. Evette Doffing

## 2019-08-30 ENCOUNTER — Other Ambulatory Visit: Payer: Self-pay

## 2019-08-30 DIAGNOSIS — J45909 Unspecified asthma, uncomplicated: Secondary | ICD-10-CM

## 2019-08-30 MED ORDER — GUAIFENESIN-CODEINE 100-10 MG/5ML PO SOLN: 5.0000 mL | Freq: Three times a day (TID) | ORAL | 0 refills | Status: DC | PRN

## 2019-08-30 MED ORDER — DULERA 100-5 MCG/ACT IN AERO: INHALATION_SPRAY | RESPIRATORY_TRACT | 5 refills | Status: DC

## 2019-08-30 NOTE — Telephone Encounter (Signed)
Done. Thank you.

## 2019-08-30 NOTE — Assessment & Plan Note (Signed)
Patient with intermittent N/V. Describes a couple episodes of "pepper" in her vomit when she had not eaten anything. History of PUD. No NSAID or EtOH use. Did not seek medical attention.   A/P: - Advised to seek medical attention if there is a recurrence  - Check CBC

## 2019-08-30 NOTE — Assessment & Plan Note (Signed)
>   6 months ago patient was seen for what sounded like torticollis. All medications that could be causing this were stopped. Since that time she has continued to have intermittent torticollis and diffuse body shaking. She remains conscious during the episodes. Are worse with action and stress. Not daily but frequency does seem to be increasing.   A/P: - She will follow-up with neurology - May need further evaluation/imaging

## 2019-08-30 NOTE — Progress Notes (Signed)
Internal Medicine Clinic Attending  Case discussed with Dr. Tarri Abernethy  at the time of the visit.  We reviewed the resident's history and pertinent patient test results.  I agree with the assessment, diagnosis, and plan of care documented in the resident's note.

## 2019-08-30 NOTE — Telephone Encounter (Signed)
TC from patient, she states during her telehealth visit yesterday with PCP she was told he would send in RX's for codeine cough medicine and Dulera.  States it's not at pharmacy. Will forward to PCP. SChaplin, RN,BSN

## 2019-09-17 ENCOUNTER — Other Ambulatory Visit: Payer: Self-pay | Admitting: Internal Medicine

## 2019-09-17 DIAGNOSIS — I10 Essential (primary) hypertension: Secondary | ICD-10-CM

## 2019-09-24 ENCOUNTER — Ambulatory Visit: Payer: Medicaid Other | Admitting: Gastroenterology

## 2019-10-03 ENCOUNTER — Other Ambulatory Visit (INDEPENDENT_AMBULATORY_CARE_PROVIDER_SITE_OTHER): Payer: Medicaid Other

## 2019-10-03 ENCOUNTER — Other Ambulatory Visit: Payer: Self-pay

## 2019-10-03 DIAGNOSIS — R112 Nausea with vomiting, unspecified: Secondary | ICD-10-CM | POA: Diagnosis not present

## 2019-10-03 DIAGNOSIS — E538 Deficiency of other specified B group vitamins: Secondary | ICD-10-CM

## 2019-10-04 LAB — CBC
Hematocrit: 45.3 % (ref 34.0–46.6)
Hemoglobin: 14.8 g/dL (ref 11.1–15.9)
MCH: 26.7 pg (ref 26.6–33.0)
MCHC: 32.7 g/dL (ref 31.5–35.7)
MCV: 82 fL (ref 79–97)
Platelets: 238 10*3/uL (ref 150–450)
RBC: 5.54 x10E6/uL — ABNORMAL HIGH (ref 3.77–5.28)
RDW: 15.8 % — ABNORMAL HIGH (ref 11.7–15.4)
WBC: 7.2 10*3/uL (ref 3.4–10.8)

## 2019-10-04 LAB — VITAMIN B12: Vitamin B-12: 292 pg/mL (ref 232–1245)

## 2019-10-14 ENCOUNTER — Other Ambulatory Visit: Payer: Self-pay | Admitting: Internal Medicine

## 2019-10-14 DIAGNOSIS — I10 Essential (primary) hypertension: Secondary | ICD-10-CM

## 2019-10-18 DIAGNOSIS — B373 Candidiasis of vulva and vagina: Secondary | ICD-10-CM | POA: Diagnosis not present

## 2019-10-18 DIAGNOSIS — E7800 Pure hypercholesterolemia, unspecified: Secondary | ICD-10-CM | POA: Diagnosis not present

## 2019-10-18 DIAGNOSIS — E1165 Type 2 diabetes mellitus with hyperglycemia: Secondary | ICD-10-CM | POA: Diagnosis not present

## 2019-10-21 ENCOUNTER — Encounter: Payer: Self-pay | Admitting: *Deleted

## 2019-10-24 ENCOUNTER — Telehealth: Payer: Self-pay | Admitting: *Deleted

## 2019-10-24 MED ORDER — BOTOX 100 UNITS IJ SOLR: INTRAMUSCULAR | 3 refills | Status: DC

## 2019-10-24 NOTE — Telephone Encounter (Signed)
Please send Botox prescription to [facility]. PA form for [employer] filled out for upcoming Botox appointment. Will fax once signed by MD.

## 2019-10-24 NOTE — Telephone Encounter (Signed)
Rx sent to the requested pharmacy.

## 2019-10-25 ENCOUNTER — Telehealth: Payer: Self-pay | Admitting: Internal Medicine

## 2019-10-25 ENCOUNTER — Ambulatory Visit: Payer: Medicaid Other | Admitting: Gastroenterology

## 2019-10-25 NOTE — Telephone Encounter (Signed)
Pt states that Dr Tarri Abernethy told her that she is not going to be here in June, pt wants to make sure pls contact [phone]

## 2019-10-28 NOTE — Telephone Encounter (Signed)
PA form faxed to [facility] [phone].

## 2019-10-30 ENCOUNTER — Telehealth: Payer: Self-pay | Admitting: Neurology

## 2019-10-30 NOTE — Telephone Encounter (Signed)
Pt complains of headache unrelieved by home medications. She would like to come in for treatment of IV medications.

## 2019-10-30 NOTE — Telephone Encounter (Signed)
Pt is needing to speak to RN about getting an infusion. Please advise.

## 2019-10-30 NOTE — Telephone Encounter (Signed)
Phone rep checked office voicemail's,at 1:27pm [facility] has called asking to be called to schedule delivery of Botox for pt.  They can be reached at [phone]

## 2019-10-30 NOTE — Telephone Encounter (Signed)
I called [facility] and spoke to [place] and scheduled delivery for 11/05/2019

## 2019-10-30 NOTE — Telephone Encounter (Signed)
I called [employer] to check the status of the PA.  I spoke to Le[place]on and she states that she does not see the request. I told her I sent it on the 26 and have  a fax confirmation.  She checked again and still could not find one.  I refaxed the PA request.

## 2019-10-30 NOTE — Telephone Encounter (Addendum)
Per vo by Dr. Krista Blue, the patient may come in for the following infusion medications:  1) VPA 1000mg  2) Toradol 30mg    I tried to call the patient but was unable to reach her. I left a message letting her know we would call her back in the morning to schedule a time for her infusion.

## 2019-10-31 ENCOUNTER — Encounter: Payer: Self-pay | Admitting: Neurology

## 2019-10-31 ENCOUNTER — Ambulatory Visit (INDEPENDENT_AMBULATORY_CARE_PROVIDER_SITE_OTHER): Payer: Medicaid Other | Admitting: Neurology

## 2019-10-31 VITALS — BP 118/90 | HR 90 | Temp 97.7°F

## 2019-10-31 DIAGNOSIS — G43711 Chronic migraine without aura, intractable, with status migrainosus: Secondary | ICD-10-CM | POA: Diagnosis not present

## 2019-10-31 DIAGNOSIS — G43701 Chronic migraine without aura, not intractable, with status migrainosus: Secondary | ICD-10-CM | POA: Diagnosis not present

## 2019-10-31 NOTE — Telephone Encounter (Addendum)
She has had the ordered drugs in the past without any adverse side effects. She is agreeable to just getting VPA and Toradol as prescribed below. She is checking with her sister for transportation but should be able to get here around 2pm.. Signed MD orders have been provided to [facility].

## 2019-10-31 NOTE — Telephone Encounter (Signed)
I called [employer] to get the PA Ref#  The PA# is [id number] Valid from 10/29/2019-10/23/2020

## 2019-11-01 NOTE — Progress Notes (Signed)
   History: 54 year old female, with history of chronic migraine headaches, presented with 2 weeks history of daily moderate to severe headache, failed response to multiple home remedies,    Bilateral occipital and trigeminal nerve block; trigger point injection of bilateral cervical and upper trapezius muscles for intractable headache  Bupivacaine 0.5% was injected on the scalp bilaterally at several locations:  -On the occipital area of the head, 3 injections each side, 0.5 cc per injection at the midpoint between the mastoid process and the occipital protuberance. 2 other injections were done one finger breadth from the initial injection, one at a 10 o'clock position and the other at a 2 o'clock position.  -2 injections of 0.5 cc were done in the temporal regions, 2 fingerbreadths above the tragus of the ear, with the second injection one fingerbreadth posteriorly to the first.  -2 injections were done on the brow, 1 in the medial brow and one over the supraorbital nerve notch, with 0.1 cc for each injection  -1 injection each side of 0.5 cc was done anterior to the tragus of the ear for a trigeminal ganglion injection  -0.5 cc was injected into bilateral upper trapezius and bilateral upper cervical paraspinals   The patient tolerated the injections well, no complications of the procedure were noted. Injections were made with a 27-gauge needle.

## 2019-11-01 NOTE — Progress Notes (Signed)
PATIENT: [name] DOB: [birth year]  Chief Complaint  Patient presents with  . Migraine    work-in nerve block     HISTORICAL  [name], Is accompanied by her sister returned for Botox injection as migraine prevention  She has past medical history of HIV, hyperlipidemia, migraines, depression, and non-Hodgkin lymphoma in 2005 with chemotherapy and radiation.   She has history of migraines since 19. In the last 10 years, she reports having daily migraines. She is currently taking Topamax and Zomig with relief. She takes approximately 20-25 tablets of her Zomig per month.    She has aura's that look like floating dirt particles, flashing lights, and she sees Christmas tree lights when they are severe, she has nausea, vomiting and sometimes diarrhea when they're severe. She also complains of diplopia, blurry vision, and  photophobia. She also has had a lightning bolt sensation throughout her body.  She describes them as pulsating and throbbing  on a scale of 5-6/10 which she considers mild, when they are severe they can go as high as 10/10. They usually start in the right temporal area, but will also start on the left temporal and are worse.   Any type of smoke or MSG can precipitate a migraine. They generally last 2-9 days. She has been to the emergency department twice in the last year for migraines. She states "I just can't do this anymore I need to do something about her migraines"  She has tried and failed different preventive medications:  atenolol, verapamil, nortriptyline, Inderal, Depakote without benefit. Atenolol and verapamil caused hypotension. Depakote caused severe vomiting. Topamax causing her worsening headaches, will show tried Zonegran, Topamax ER, she denies significant improvement.  For abortive treatment, she has tried Imitrex, Maxalt with suboptimal response, Zomig as needed since to work best,  she also takes transderm scophalamine patch for nause  I started to  give her BOTOX injection since May 2013, every 3 months, she responded very well,   Previous MRI brain was normal in 2013.  Last Botox injection as migraine prevention was March 54, she responded very well, but in recent few weeks, she noticed increased migraine headaches, this baby had headache left retro-orbital area severe pounding headache with associated light noise sensitivity, has been ongoing for 3 days, failed to respond to Zomig by mouth, Imitrex injection as needed, she has constant dry heaves, difficulty opening her eyes, In addition, today she complains of restless leg symptoms, difficult to stand or lying still for extended period of time, has tried Neurontin-cause nausea, confusion,  Lyrica-cause excessive weight gain, she has never tried dopamine agonist in the past  I reviewed laboratory in April 2017, normal hemoglobin 53 point 9, mild elevated RDW 15.8, decreased MCV  Update April 27 2016  Last Botox injection was in June 54, she had left knee arthroscopic surgery in August 2017, complaining of significant joints pain, unsteady gait, before Botox she was having moderate to severe headache daily, even Botox now, she used to 12 tablets of Zomig every months because of frequent moderate to severe headache  Update August 10 2016: Last Botox injection was in October 2017, has been very helpful, but over the past 3-4 weeks, she had frequent almost daily headaches, oftentimes wake her up from sleep, Zomig has been very helpful,  Low dose Mirapex 0.25 milligrams 2 tablets every night has been very helpful for her restless leg symptoms, she reported 80% improvement, laboratory reviewed in January 2018, CD4 count 760,  normal hemoglobin 12.3, mildly decreased MCV, increased RDW,  Update Nov 09 2016: She suffered bilateral ear infection, strep throat since last visit, had frequent headaches, nausea,  Update February 22 2017: She responded well to previous injection, complains of a  lot of stress at home  UPDATE May 31 2017: She complains suboptimal response to Botox injection, had daily headache over last week, also complains of a lot of stress at home, average more than 4 headaches in 1 week, lasting all day, with associated nausea vomiting, is taking Zomig as needed, with partial response  UPDATE September 08 2017: She complains of excessive stress, frequent migraine headaches,  UPDATE December 20 2017: She is accompanied by her sister at today's clinical visit, Ajovy injection has helped her migraine, she rated as 25%, but with the combination of Ajovy and Botox injection, she still has 3 migraine headaches each week, has been dealing with daily headaches over the past 1 week, required multiple dose of Zomig treatment  UPDATE Sept 25 2019: She responded well to previous injection, today she complains of 2 weeks history of severe headaches failed home remedy treatment, multiple dose of Zomig,  She is also getting Ajovy injection every month, which has been helpful,  UPDATE Jun 21 2018: she overall responding well to Botox, Ajovy, also taking amitriptyline 25 mg 2 tablets every night, Zomig as needed for abortive treatment, couple headaches each week, usually responding well to triptan treatment, but this particular headache been ongoing for 2 weeks, tried and failed multiple home medications.  UPDATE September 20 2018: She responded well to previous injection, but at the end of injection cycle,She would have more frequent prolonged headaches, she used Zomig as needed,  UPDATE December 19 2018: She responded very well to previous injection.  UPDATE Apr 17 2019: She is with her sister at today's visit, she reported a lot of stress at home, has increased frequency of migraines, almost daily basis over the past few weeks, she also suffered depression anxiety  UPDATE Aug 07 2019: She continues to have frequent headaches at home.  Lamotrigine and Elavil were not helpful, she has stopped  taking them, Botox injection along with aimovig were very helpful.  REVIEW OF SYSTEMS: Full 14 system review of systems performed and notable only for as above  ALLERGIES: Allergies  Allergen Reactions  . Diazepam     Triggers migraines  . Divalproex Sodium Nausea And Vomiting    Causes light-headedness  . Gabapentin Nausea And Vomiting    confusion  . Ondansetron Hcl Nausea And Vomiting    Triggers migraines, "makes me vomit"  . Penicillins     REACTION: lips swollen to point of bleeding, sever vaginal irritation  Reports tolerating augmentin well without any complaints or reaction  . Pineapple   . Propoxyphene N-Acetaminophen Nausea And Vomiting  . Simvastatin     PATIENT CANNOT BE PRESCRIBED THIS STATIN WHILE RECEIVING NORVIR  . Topamax [Topiramate]     Migraines   . Tramadol Hcl Itching    REACTION: migraines and itching--hx of migraines even without medication   . Zocor [Simvastatin - High Dose]     HOME MEDICATIONS: Current Outpatient Medications  Medication Sig Dispense Refill  . albuterol (PROAIR HFA) 108 (90 Base) MCG/ACT inhaler USE 1 TO 2 PUFFS EVERY 4-6 HOURS AS NEEDED FOR SHORTNESS OF BREATH ORWHEEZING 8.5 g 1  . Beclomethasone Dipropionate 80 MCG/ACT AERS Place 2 Inhalers into the nose daily. 8.7 g 10  . blood glucose meter kit  and supplies Dispense based on patient and insurance preference. Use up to four times daily as directed. (FOR ICD-9 250.00, 250.01). 1 each 0  . botulinum toxin Type A (BOTOX) 100 units SOLR injection FOR OFFICE ADMINISTRATION. PHYSICIAN TO INJECT UP TO 200 UNITS INTRAMUSCULARLY INTO HEAD, FACE AND NECK AREA EVERY 90 DAYS. 2 each 3  . Calcium Carbonate-Vitamin D (CALCIUM-VITAMIN D) 500-200 MG-UNIT per tablet Take 2 tablets by mouth daily.     . diclofenac sodium (VOLTAREN) 1 % GEL APPLY TOPICALLY TO AFFECTED AREA 4 GRAMS4 TIMES DAILY 100 g 11  . diclofenac Sodium (VOLTAREN) 1 % GEL APPLY TOPICALLY TO AFFECTED AREA 4 GRAMS4 TIMES DAILY 100  g 2  . Erenumab-aooe (AIMOVIG) 140 MG/ML SOAJ Inject 140 mg into the skin every 30 (thirty) days. 1 mL 11  . FARXIGA 10 MG TABS tablet TAKE 1 TABLET BY MOUTH EVERY DAY 90 tablet 3  . fluconazole (DIFLUCAN) 100 MG tablet TAKE 1 TABLET BY MOUTH EVERY DAY 14 tablet 3  . fluocinonide (LIDEX) 0.05 % external solution 1 gtt AU qD prn itching    . glucose blood test strip Use as instructed 100 each 12  . guaiFENesin-codeine 100-10 MG/5ML syrup Take 5 mLs by mouth 3 (three) times daily as needed for cough. 120 mL 0  . liraglutide (VICTOZA) 18 MG/3ML SOPN Inject 0.6 mg daily for 1 week, then increase to 1.2 mg daily 3 pen 0  . lisinopril (ZESTRIL) 20 MG tablet TAKE 1 TABLET BY MOUTH EVERY DAY 90 tablet 0  . mometasone-formoterol (DULERA) 100-5 MCG/ACT AERO USE 2 PUFFS BY MOUTH THE FIRST THING IN THE MORNING AND THEN ANOTHER 2 PUFFS ABOUT 12 HOURS LATER 13 g 5  . pantoprazole (PROTONIX) 40 MG tablet TAKE 1 TABLET BY MOUTH 2 TIMES A DAY FORACID REFLUX 60 tablet 4  . pravastatin (PRAVACHOL) 40 MG tablet TAKE 1 TABLET BY MOUTH EVERY DAY FOR CHOLESTEROL 90 tablet 1  . sertraline (ZOLOFT) 100 MG tablet Take 1 tablet (100 mg total) by mouth daily. 90 tablet 3  . Tamsulosin HCl (FLOMAX) 0.4 MG CAPS Take 0.4 mg by mouth daily. 1-2 daily    . tiZANidine (ZANAFLEX) 4 MG tablet TAKE 1 TABLET BY MOUTH 2 TIMES A DAY AS NEEDED FOR MUSCLE SPASMS 60 tablet 11  . zolmitriptan (ZOMIG) 5 MG tablet Take 1 tab at onset of migraine.  May repeat, with 1 tab, in 2 hrs, if needed.  Max dose of 2 tabs/24hrs.  Must last 30 days. #12/30 12 tablet 11  . zolpidem (AMBIEN) 10 MG tablet TAKE 1 TABLET BY MOUTH EVERY NIGHT AT BEDTIME AS NEEDED FOR SLEEP 30 tablet 3   No current facility-administered medications for this visit.    PAST MEDICAL HISTORY: Past Medical History:  Diagnosis Date  . Acute gastritis without mention of hemorrhage 01/01/2002  . Anemia 10/2009   macrocytic anemia with baseline MCV 104-106  . Anxiety   . Asthma     . Boil 01/05/2017  . 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 Complex tear of medial meniscus of left knee as current injury 02/25/2016  . Complication of anesthesia    hard to wake up  . COPD (chronic obstructive pulmonary disease) (HCC)    chronic bronchitis  . Derangement of anterior horn of lateral meniscus of left knee 02/25/2016  . Duodenitis without mention of hemorrhage 01/01/2002  . DVT (deep venous thrombosis) (HCC)    history clot lt groin when she had lymphoma-in remision now.  Marland Kitchen  Elevated triglycerides with high cholesterol 01/05/2017  . Gastroparesis   . GERD (gastroesophageal reflux disease)   . Hiatal hernia 01/01/2002  . HIV infection ([place])    CD4 = 570 (05/2010), VL undetectation  . Hyperlipidemia   . IBS (irritable bowel syndrome)   . Migraines    on topamax and triptans, frequent (almost daily) attacks   . MRSA (methicillin resistant staph aureus) culture positive 12/20/2010  . Non Hodgkin's lymphoma ([place])    stage II, s/p resection, chemotherapy, radiotherapy, Dr. Beryle Beams is her oncologist.   . PUD (peptic ulcer disease) 07/1997   per EGD report 07/1997 with history of esophagitis  . Restless leg syndrome 12/24/2015  . Shingles    in lumbar dermatome  . Spondylolisthesis of lumbar region    L5-S1  . TMJ (dislocation of temporomandibular joint)   . Traumatic tear of lateral meniscus of right knee 04/20/2012  . Vaginal yeast infection 09/25/2017    PAST SURGICAL HISTORY: Past Surgical History:  Procedure Laterality Date  . CHOLECYSTECTOMY    . ENDOMETRIAL ABLATION    . KNEE ARTHROSCOPY Right 04/20/12  . KNEE ARTHROSCOPY WITH LATERAL MENISECTOMY Left 02/25/2016   Procedure: LEFT KNEE ARTHROSCOPY WITH MEDIAL AND  LATERAL MENISECTOMIES;  Surgeon: Marchia Bond, MD;  Location: [facility];  Service: Orthopedics;  Laterality: Left;  . KNEE ARTHROSCOPY WITH MEDIAL MENISECTOMY Left 02/25/2016   Procedure: KNEE ARTHROSCOPY WITH MEDIAL MENISECTOMY;   Surgeon: Marchia Bond, MD;  Location: [facility];  Service: Orthopedics;  Laterality: Left;  . TONSILLECTOMY    . TYMPANOPLASTY Left   . TYMPANOSTOMY TUBE PLACEMENT     as child-both  . UPPER GASTROINTESTINAL ENDOSCOPY  2003, 2007   done by Dr. Velora Heckler    FAMILY HISTORY: Family History  Problem Relation Age of Onset  . Hypertension Mother   . Stroke Mother   . Heart disease Mother   . Diabetes Mother   . Breast cancer Paternal Grandmother   . Kidney cancer Maternal Uncle   . Heart disease Maternal Uncle   . Diabetes Maternal Uncle   . Diabetes Maternal Grandmother   . Heart disease Maternal Grandmother   . Emphysema Paternal Uncle        never smoker  . Emphysema Paternal Grandfather        never smoker  . Colon cancer Paternal Aunt        with met to lung   . Non-Hodgkin's lymphoma Maternal Uncle   . Heart disease Maternal Grandfather     SOCIAL HISTORY:  Social History   Socioeconomic History  . Marital status: Divorced    Spouse name: Not on file  . Number of children: 0  . Years of education: 49  . Highest education level: Not on file  Occupational History  . Occupation: disabled    Fish farm manager: UNEMPLOYED  Tobacco Use  . Smoking status: Never Smoker  . Smokeless tobacco: Never Used  Substance and Sexual Activity  . Alcohol use: No    Alcohol/week: 0.0 standard drinks  . Drug use: No  . Sexual activity: Not Currently    Partners: Male    Birth control/protection: Surgical  Other Topics Concern  . Not on file  Social History Narrative      Patient is disabled.   Right handed.   Caffeine - Diet soda., tea   Education - high school   Social Determinants of Health   Financial Resource Strain:   . Difficulty of Paying Living Expenses:  Food Insecurity:   . Worried About Charity fundraiser in the Last Year:   . Arboriculturist in the Last Year:   Transportation Needs:   . Film/video editor (Medical):   Marland Kitchen Lack of Transportation  (Non-Medical):   Physical Activity:   . Days of Exercise per Week:   . Minutes of Exercise per Session:   Stress:   . Feeling of Stress :   Social Connections:   . Frequency of Communication with Friends and Family:   . Frequency of Social Gatherings with Friends and Family:   . Attends Religious Services:   . Active Member of Clubs or Organizations:   . Attends Archivist Meetings:   Marland Kitchen Marital Status:   Intimate Partner Violence:   . Fear of Current or Ex-Partner:   . Emotionally Abused:   Marland Kitchen Physically Abused:   . Sexually Abused:      PHYSICAL EXAM   Vitals:   10/31/19 1517  BP: 118/90  Pulse: 90  Temp: 97.7 F (36.5 C)    Not recorded      PHYSICAL EXAMNIATION:  NEUROLOGICAL EXAM: In distress, tired looking  MENTAL STATUS: Speech/Cognition: Awake, alert, normal speech, oriented to history taking and casual conversation.  CRANIAL NERVES: CN II: Visual fields are full to confrontation.  Pupils are round equal and briskly reactive to light. CN III, IV, VI: extraocular movement are normal. No ptosis. CN V: Facial sensation is intact to light touch. CN VII: Face is symmetric with normal eye closure and smile. CN VIII: Hearing is normal to casual conversation CN IX, X: Palate elevates symmetrically. Phonation is normal. CN XI: Head turning and shoulder shrug are intact CN XII: Tongue is midline with normal movements and no atrophy.  MOTOR: Muscle bulk and tone are normal. Muscle strength is normal.  REFLEXES: Reflexes are 2  and symmetric at the biceps, triceps, knees and ankles. Plantar responses are flexor.  SENSORY: Intact to light touch, pinprick, positional and vibratory sensation at fingers and toes.  COORDINATION: There is no trunk or limb ataxia.    GAIT/STANCE: Posture is normal. Gait is steady with normal steps, base, arm swing and turning.   There is no height or weight on file to calculate BMI.  DIAGNOSTIC DATA (LABS, IMAGING,  TESTING) - I reviewed patient records, labs, notes, testing and imaging myself where available.   ASSESSMENT AND PLAN  ANIYA JOLICOEUR is a 54 y.o. female    History of HIV, hypercholesterolemia, Non hodgkin' lymphoma with chemotherapy and radiation and migraines since age 48.   Chronic migraine headaches She has tried and failed multiple preventive medications including atenolol, verapamil, nortriptyline, and Depakote,Topamax, Topamax ER, zonegran could not tolerate Cardizem,  She has migraines daily for more than  10 years. She is currently taking amitriptyline 25 mg 2 tablets every day and Zomig with some relief. Previously tried imitrex and Maxalt with suboptimal response,  Normal MRI brain, and neurological exam.  Last Botox injection as migraine prevention was on August 07, 2019,  She presented with 2 weeks history of daily moderate to severe headaches,  Failed multiple home rescue medications, including multiple dose of Zomig, with combination of Zofran, tizanidine, Aleve,  Reported 10 out of 10 migraine headaches with nausea upon arrival, will give her Decadron IV 1 g, followed by Toradol 30 mg, only reported mild improvement, 8 out of 10, I also gave her nerve block, which has effectively helped her headache, she reported immediate  relief of her significant headaches,  Continue preventive medications aimovig 140 mg once every month,  Zomig as needed,  Continue Botox injection every 3 months   Marcial Pacas, M.D. Ph.D.  [facility] [address], [place], [place] [address] Ph: [phone] Fax: [phone]  CC: Referring Provider

## 2019-11-06 ENCOUNTER — Other Ambulatory Visit: Payer: Self-pay

## 2019-11-06 ENCOUNTER — Ambulatory Visit (INDEPENDENT_AMBULATORY_CARE_PROVIDER_SITE_OTHER): Payer: Medicaid Other | Admitting: Neurology

## 2019-11-06 ENCOUNTER — Encounter: Payer: Self-pay | Admitting: Neurology

## 2019-11-06 VITALS — BP 129/84 | HR 94 | Temp 97.2°F

## 2019-11-06 DIAGNOSIS — G43719 Chronic migraine without aura, intractable, without status migrainosus: Secondary | ICD-10-CM | POA: Diagnosis not present

## 2019-11-06 NOTE — Progress Notes (Signed)
PATIENT: [name] DOB: [birth year]  Chief Complaint  Patient presents with  . Botulinum Toxin Injection    Rm 4 with sister      HISTORICAL  [name], Is accompanied by her sister returned for Botox injection as migraine prevention  She has past medical history of HIV, hyperlipidemia, migraines, depression, and non-Hodgkin lymphoma in 2005 with chemotherapy and radiation.   She has history of migraines since 19. In the last 10 years, she reports having daily migraines. She is currently taking Topamax and Zomig with relief. She takes approximately 20-25 tablets of her Zomig per month.    She has aura's that look like floating dirt particles, flashing lights, and she sees Christmas tree lights when they are severe, she has nausea, vomiting and sometimes diarrhea when they're severe. She also complains of diplopia, blurry vision, and  photophobia. She also has had a lightning bolt sensation throughout her body.  She describes them as pulsating and throbbing  on a scale of 5-6/10 which she considers mild, when they are severe they can go as high as 10/10. They usually start in the right temporal area, but will also start on the left temporal and are worse.   Any type of smoke or MSG can precipitate a migraine. They generally last 2-9 days. She has been to the emergency department twice in the last year for migraines. She states "I just can't do this anymore I need to do something about her migraines"  She has tried and failed different preventive medications:  atenolol, verapamil, nortriptyline, Inderal, Depakote without benefit. Atenolol and verapamil caused hypotension. Depakote caused severe vomiting. Topamax causing her worsening headaches, will show tried Zonegran, Topamax ER, she denies significant improvement.  For abortive treatment, she has tried Imitrex, Maxalt with suboptimal response, Zomig as needed since to work best,  she also takes transderm scophalamine patch for  nause  I started to give her BOTOX injection since May 2013, every 3 months, she responded very well,   Previous MRI brain was normal in 2013.  Last Botox injection as migraine prevention was March 2017, she responded very well, but in recent few weeks, she noticed increased migraine headaches, this baby had headache left retro-orbital area severe pounding headache with associated light noise sensitivity, has been ongoing for 3 days, failed to respond to Zomig by mouth, Imitrex injection as needed, she has constant dry heaves, difficulty opening her eyes, In addition, today she complains of restless leg symptoms, difficult to stand or lying still for extended period of time, has tried Neurontin-cause nausea, confusion,  Lyrica-cause excessive weight gain, she has never tried dopamine agonist in the past  I reviewed laboratory in April 2017, normal hemoglobin 12 point 9, mild elevated RDW 15.8, decreased MCV  Update April 27 2016  Last Botox injection was in June 2017, she had left knee arthroscopic surgery in August 2017, complaining of significant joints pain, unsteady gait, before Botox she was having moderate to severe headache daily, even Botox now, she used to 12 tablets of Zomig every months because of frequent moderate to severe headache  Update August 10 2016: Last Botox injection was in October 2017, has been very helpful, but over the past 3-4 weeks, she had frequent almost daily headaches, oftentimes wake her up from sleep, Zomig has been very helpful,  Low dose Mirapex 0.25 milligrams 2 tablets every night has been very helpful for her restless leg symptoms, she reported 80% improvement, laboratory reviewed in January  2018, CD4 count 760, normal hemoglobin 12.3, mildly decreased MCV, increased RDW,  Update Nov 09 2016: She suffered bilateral ear infection, strep throat since last visit, had frequent headaches, nausea,  Update February 22 2017: She responded well to previous  injection, complains of a lot of stress at home  UPDATE May 31 2017: She complains suboptimal response to Botox injection, had daily headache over last week, also complains of a lot of stress at home, average more than 4 headaches in 1 week, lasting all day, with associated nausea vomiting, is taking Zomig as needed, with partial response  UPDATE September 08 2017: She complains of excessive stress, frequent migraine headaches,  UPDATE December 20 2017: She is accompanied by her sister at today's clinical visit, Ajovy injection has helped her migraine, she rated as 25%, but with the combination of Ajovy and Botox injection, she still has 3 migraine headaches each week, has been dealing with daily headaches over the past 1 week, required multiple dose of Zomig treatment  UPDATE Sept 25 2019: She responded well to previous injection, today she complains of 2 weeks history of severe headaches failed home remedy treatment, multiple dose of Zomig,  She is also getting Ajovy injection every month, which has been helpful,  UPDATE Jun 21 2018: she overall responding well to Botox, Ajovy, also taking amitriptyline 25 mg 2 tablets every night, Zomig as needed for abortive treatment, couple headaches each week, usually responding well to triptan treatment, but this particular headache been ongoing for 2 weeks, tried and failed multiple home medications.  UPDATE September 20 2018: She responded well to previous injection, but at the end of injection cycle,She would have more frequent prolonged headaches, she used Zomig as needed,  UPDATE December 19 2018: She responded very well to previous injection.  UPDATE Apr 17 2019: She is with her sister at today's visit, she reported a lot of stress at home, has increased frequency of migraines, almost daily basis over the past few weeks, she also suffered depression anxiety  UPDATE Aug 07 2019: She continues to have frequent headaches at home.  Lamotrigine and Elavil were  not helpful, she has stopped taking them, Botox injection along with aimovig were very helpful.  UPDATE Nov 06 2019: She return for Botox injection as migraine prevention, previous nerve block in combination with Depakote on October 31, 2019 has helped her headache  REVIEW OF SYSTEMS: Full 14 system review of systems performed and notable only for as above  ALLERGIES: Allergies  Allergen Reactions  . Diazepam     Triggers migraines  . Divalproex Sodium Nausea And Vomiting    Causes light-headedness  . Gabapentin Nausea And Vomiting    confusion  . Ondansetron Hcl Nausea And Vomiting    Triggers migraines, "makes me vomit"  . Penicillins     REACTION: lips swollen to point of bleeding, sever vaginal irritation  Reports tolerating augmentin well without any complaints or reaction  . Pineapple   . Propoxyphene N-Acetaminophen Nausea And Vomiting  . Simvastatin     PATIENT CANNOT BE PRESCRIBED THIS STATIN WHILE RECEIVING NORVIR  . Topamax [Topiramate]     Migraines   . Tramadol Hcl Itching    REACTION: migraines and itching--hx of migraines even without medication   . Zocor [Simvastatin - High Dose]     HOME MEDICATIONS: Current Outpatient Medications  Medication Sig Dispense Refill  . albuterol (PROAIR HFA) 108 (90 Base) MCG/ACT inhaler USE 1 TO 2 PUFFS EVERY 4-6 HOURS AS  NEEDED FOR SHORTNESS OF BREATH ORWHEEZING 8.5 g 1  . Beclomethasone Dipropionate 80 MCG/ACT AERS Place 2 Inhalers into the nose daily. 8.7 g 10  . blood glucose meter kit and supplies Dispense based on patient and insurance preference. Use up to four times daily as directed. (FOR ICD-9 250.00, 250.01). 1 each 0  . botulinum toxin Type A (BOTOX) 100 units SOLR injection FOR OFFICE ADMINISTRATION. PHYSICIAN TO INJECT UP TO 200 UNITS INTRAMUSCULARLY INTO HEAD, FACE AND NECK AREA EVERY 90 DAYS. 2 each 3  . Calcium Carbonate-Vitamin D (CALCIUM-VITAMIN D) 500-200 MG-UNIT per tablet Take 2 tablets by mouth daily.     .  diclofenac sodium (VOLTAREN) 1 % GEL APPLY TOPICALLY TO AFFECTED AREA 4 GRAMS4 TIMES DAILY 100 g 11  . diclofenac Sodium (VOLTAREN) 1 % GEL APPLY TOPICALLY TO AFFECTED AREA 4 GRAMS4 TIMES DAILY 100 g 2  . Erenumab-aooe (AIMOVIG) 140 MG/ML SOAJ Inject 140 mg into the skin every 30 (thirty) days. 1 mL 11  . FARXIGA 10 MG TABS tablet TAKE 1 TABLET BY MOUTH EVERY DAY 90 tablet 3  . fluconazole (DIFLUCAN) 100 MG tablet TAKE 1 TABLET BY MOUTH EVERY DAY 14 tablet 3  . fluocinonide (LIDEX) 0.05 % external solution 1 gtt AU qD prn itching    . glucose blood test strip Use as instructed 100 each 12  . guaiFENesin-codeine 100-10 MG/5ML syrup Take 5 mLs by mouth 3 (three) times daily as needed for cough. 120 mL 0  . liraglutide (VICTOZA) 18 MG/3ML SOPN Inject 0.6 mg daily for 1 week, then increase to 1.2 mg daily 3 pen 0  . lisinopril (ZESTRIL) 20 MG tablet TAKE 1 TABLET BY MOUTH EVERY DAY 90 tablet 0  . mometasone-formoterol (DULERA) 100-5 MCG/ACT AERO USE 2 PUFFS BY MOUTH THE FIRST THING IN THE MORNING AND THEN ANOTHER 2 PUFFS ABOUT 12 HOURS LATER 13 g 5  . pantoprazole (PROTONIX) 40 MG tablet TAKE 1 TABLET BY MOUTH 2 TIMES A DAY FORACID REFLUX 60 tablet 4  . pravastatin (PRAVACHOL) 40 MG tablet TAKE 1 TABLET BY MOUTH EVERY DAY FOR CHOLESTEROL 90 tablet 1  . sertraline (ZOLOFT) 100 MG tablet Take 1 tablet (100 mg total) by mouth daily. 90 tablet 3  . Tamsulosin HCl (FLOMAX) 0.4 MG CAPS Take 0.4 mg by mouth daily. 1-2 daily    . tiZANidine (ZANAFLEX) 4 MG tablet TAKE 1 TABLET BY MOUTH 2 TIMES A DAY AS NEEDED FOR MUSCLE SPASMS 60 tablet 11  . zolmitriptan (ZOMIG) 5 MG tablet Take 1 tab at onset of migraine.  May repeat, with 1 tab, in 2 hrs, if needed.  Max dose of 2 tabs/24hrs.  Must last 30 days. #12/30 12 tablet 11  . zolpidem (AMBIEN) 10 MG tablet TAKE 1 TABLET BY MOUTH EVERY NIGHT AT BEDTIME AS NEEDED FOR SLEEP 30 tablet 3   No current facility-administered medications for this visit.    PAST MEDICAL  HISTORY: Past Medical History:  Diagnosis Date  . Acute gastritis without mention of hemorrhage 01/01/2002  . Anemia 10/2009   macrocytic anemia with baseline MCV 104-106  . Anxiety   . Asthma   . Boil 01/05/2017  . 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 Complex tear of medial meniscus of left knee as current injury 02/25/2016  . Complication of anesthesia    hard to wake up  . COPD (chronic obstructive pulmonary disease) (HCC)    chronic bronchitis  . Derangement of anterior horn of lateral meniscus of  left knee 02/25/2016  . Duodenitis without mention of hemorrhage 01/01/2002  . DVT (deep venous thrombosis) (HCC)    history clot lt groin when she had lymphoma-in remision now.  . Elevated triglycerides with high cholesterol 01/05/2017  . Gastroparesis   . GERD (gastroesophageal reflux disease)   . Hiatal hernia 01/01/2002  . HIV infection ([place])    CD4 = 570 (05/2010), VL undetectation  . Hyperlipidemia   . IBS (irritable bowel syndrome)   . Migraines    on topamax and triptans, frequent (almost daily) attacks   . MRSA (methicillin resistant staph aureus) culture positive 12/20/2010  . Non Hodgkin's lymphoma ([place])    stage II, s/p resection, chemotherapy, radiotherapy, Dr. Beryle Beams is her oncologist.   . PUD (peptic ulcer disease) 07/1997   per EGD report 07/1997 with history of esophagitis  . Restless leg syndrome 12/24/2015  . Shingles    in lumbar dermatome  . Spondylolisthesis of lumbar region    L5-S1  . TMJ (dislocation of temporomandibular joint)   . Traumatic tear of lateral meniscus of right knee 04/20/2012  . Vaginal yeast infection 09/25/2017    PAST SURGICAL HISTORY: Past Surgical History:  Procedure Laterality Date  . CHOLECYSTECTOMY    . ENDOMETRIAL ABLATION    . KNEE ARTHROSCOPY Right 04/20/12  . KNEE ARTHROSCOPY WITH LATERAL MENISECTOMY Left 02/25/2016   Procedure: LEFT KNEE ARTHROSCOPY WITH MEDIAL AND  LATERAL MENISECTOMIES;  Surgeon: Marchia Bond, MD;   Location: [facility];  Service: Orthopedics;  Laterality: Left;  . KNEE ARTHROSCOPY WITH MEDIAL MENISECTOMY Left 02/25/2016   Procedure: KNEE ARTHROSCOPY WITH MEDIAL MENISECTOMY;  Surgeon: Marchia Bond, MD;  Location: [facility];  Service: Orthopedics;  Laterality: Left;  . TONSILLECTOMY    . TYMPANOPLASTY Left   . TYMPANOSTOMY TUBE PLACEMENT     as child-both  . UPPER GASTROINTESTINAL ENDOSCOPY  2003, 2007   done by Dr. Velora Heckler    FAMILY HISTORY: Family History  Problem Relation Age of Onset  . Hypertension Mother   . Stroke Mother   . Heart disease Mother   . Diabetes Mother   . Breast cancer Paternal Grandmother   . Kidney cancer Maternal Uncle   . Heart disease Maternal Uncle   . Diabetes Maternal Uncle   . Diabetes Maternal Grandmother   . Heart disease Maternal Grandmother   . Emphysema Paternal Uncle        never smoker  . Emphysema Paternal Grandfather        never smoker  . Colon cancer Paternal Aunt        with met to lung   . Non-Hodgkin's lymphoma Maternal Uncle   . Heart disease Maternal Grandfather     SOCIAL HISTORY:  Social History   Socioeconomic History  . Marital status: Divorced    Spouse name: Not on file  . Number of children: 0  . Years of education: 74  . Highest education level: Not on file  Occupational History  . Occupation: disabled    Fish farm manager: UNEMPLOYED  Tobacco Use  . Smoking status: Never Smoker  . Smokeless tobacco: Never Used  Substance and Sexual Activity  . Alcohol use: No    Alcohol/week: 0.0 standard drinks  . Drug use: No  . Sexual activity: Not Currently    Partners: Male    Birth control/protection: Surgical  Other Topics Concern  . Not on file  Social History Narrative      Patient is disabled.   Right  handed.   Caffeine - Diet soda., tea   Education - high school   Social Determinants of Health   Financial Resource Strain:   . Difficulty of Paying Living Expenses:   Food  Insecurity:   . Worried About Charity fundraiser in the Last Year:   . Arboriculturist in the Last Year:   Transportation Needs:   . Film/video editor (Medical):   Marland Kitchen Lack of Transportation (Non-Medical):   Physical Activity:   . Days of Exercise per Week:   . Minutes of Exercise per Session:   Stress:   . Feeling of Stress :   Social Connections:   . Frequency of Communication with Friends and Family:   . Frequency of Social Gatherings with Friends and Family:   . Attends Religious Services:   . Active Member of Clubs or Organizations:   . Attends Archivist Meetings:   Marland Kitchen Marital Status:   Intimate Partner Violence:   . Fear of Current or Ex-Partner:   . Emotionally Abused:   Marland Kitchen Physically Abused:   . Sexually Abused:      PHYSICAL EXAM   Vitals:   11/06/19 1602  BP: 129/84  Pulse: 94  Temp: (!) 97.2 F (36.2 C)  TempSrc: Temporal    Not recorded        ASSESSMENT AND PLAN  [name] is a 54 y.o. female    History of HIV, hypercholesterolemia, Non hodgkin' lymphoma with chemotherapy and radiation and migraines since age 80.   Chronic migraine headaches  Botox injection for chronic migraine prevention, injection was performed according to [place] protocol,  5 units of Botox was injected into each side, for 31 injection sites, total of 155 units  Bilateral frontalis 4 injection sites Bilateral temporalis 8 injection sites Bilateral occipitalis 6 injection sites Bilateral cervical paraspinals 4 injection sites Bilateral upper trapezius 6 injection sites  Extra 60 unites were injected into right levator scapula, cervical paraspinal, and upper trapezius  Marcial Pacas, M.D. Ph.D.  [facility] [address], [place], [place] [address] Ph: [phone] Fax: [phone]  CC: Referring Provider

## 2019-11-06 NOTE — Progress Notes (Signed)
Botox 2 100 unit vials Lot [id number] Exp 04/2022 Specialty Pharmacy

## 2019-11-07 ENCOUNTER — Encounter: Payer: Self-pay | Admitting: *Deleted

## 2019-11-12 ENCOUNTER — Ambulatory Visit: Payer: Medicaid Other | Admitting: Obstetrics and Gynecology

## 2019-11-13 ENCOUNTER — Other Ambulatory Visit: Payer: Self-pay | Admitting: Internal Medicine

## 2019-11-13 ENCOUNTER — Other Ambulatory Visit: Payer: Self-pay | Admitting: Neurology

## 2019-11-13 ENCOUNTER — Other Ambulatory Visit: Payer: Self-pay | Admitting: Infectious Disease

## 2019-11-13 DIAGNOSIS — R3912 Poor urinary stream: Secondary | ICD-10-CM | POA: Diagnosis not present

## 2019-11-13 DIAGNOSIS — R351 Nocturia: Secondary | ICD-10-CM | POA: Diagnosis not present

## 2019-11-13 DIAGNOSIS — G4700 Insomnia, unspecified: Secondary | ICD-10-CM

## 2019-11-13 DIAGNOSIS — N312 Flaccid neuropathic bladder, not elsewhere classified: Secondary | ICD-10-CM | POA: Diagnosis not present

## 2019-11-13 DIAGNOSIS — R3914 Feeling of incomplete bladder emptying: Secondary | ICD-10-CM | POA: Diagnosis not present

## 2019-11-13 DIAGNOSIS — R3911 Hesitancy of micturition: Secondary | ICD-10-CM | POA: Diagnosis not present

## 2019-11-13 NOTE — Telephone Encounter (Signed)
zolpidem (AMBIEN) 10 MG tablet, refill request @  [facility], [place] - [address] [phone] (Phone) [phone] (Fax)

## 2019-11-14 ENCOUNTER — Encounter: Payer: Medicaid Other | Admitting: Internal Medicine

## 2019-11-14 ENCOUNTER — Other Ambulatory Visit: Payer: Self-pay | Admitting: Internal Medicine

## 2019-11-14 NOTE — Telephone Encounter (Signed)
Called pharm, they have refills

## 2019-11-14 NOTE — Telephone Encounter (Signed)
Need refill on zolpidem (AMBIEN) 10 MG tablet  ;pt contact [facility],  - [address]

## 2019-11-15 ENCOUNTER — Encounter: Payer: Self-pay | Admitting: Obstetrics

## 2019-11-15 ENCOUNTER — Other Ambulatory Visit: Payer: Self-pay

## 2019-11-15 ENCOUNTER — Other Ambulatory Visit (HOSPITAL_COMMUNITY)
Admission: RE | Admit: 2019-11-15 | Discharge: 2019-11-15 | Disposition: A | Discharge status: Home / Self Care | Payer: Medicaid Other | Source: Ambulatory Visit | Attending: Obstetrics | Admitting: Obstetrics

## 2019-11-15 ENCOUNTER — Ambulatory Visit: Payer: Medicaid Other | Admitting: Obstetrics

## 2019-11-15 VITALS — BP 132/78 | HR 110 | Wt 279.0 lb

## 2019-11-15 DIAGNOSIS — B373 Candidiasis of vulva and vagina: Secondary | ICD-10-CM

## 2019-11-15 DIAGNOSIS — R300 Dysuria: Secondary | ICD-10-CM

## 2019-11-15 DIAGNOSIS — E139 Other specified diabetes mellitus without complications: Secondary | ICD-10-CM

## 2019-11-15 DIAGNOSIS — B3731 Acute candidiasis of vulva and vagina: Secondary | ICD-10-CM

## 2019-11-15 DIAGNOSIS — N750 Cyst of Bartholin's gland: Secondary | ICD-10-CM

## 2019-11-15 DIAGNOSIS — Z124 Encounter for screening for malignant neoplasm of cervix: Secondary | ICD-10-CM | POA: Insufficient documentation

## 2019-11-15 DIAGNOSIS — N898 Other specified noninflammatory disorders of vagina: Secondary | ICD-10-CM | POA: Diagnosis not present

## 2019-11-15 DIAGNOSIS — Z01419 Encounter for gynecological examination (general) (routine) without abnormal findings: Secondary | ICD-10-CM

## 2019-11-15 MED ORDER — METRONIDAZOLE 500 MG PO TABS: 500.0000 mg | ORAL_TABLET | Freq: Two times a day (BID) | ORAL | 0 refills | Status: DC

## 2019-11-15 MED ORDER — IBUPROFEN 800 MG PO TABS: 800.0000 mg | ORAL_TABLET | Freq: Three times a day (TID) | ORAL | 5 refills | Status: DC | PRN

## 2019-11-15 MED ORDER — DOXYCYCLINE HYCLATE 100 MG PO CAPS: 100.0000 mg | ORAL_CAPSULE | Freq: Two times a day (BID) | ORAL | 0 refills | Status: DC

## 2019-11-15 MED ORDER — FLUCONAZOLE 100 MG PO TABS: 100.0000 mg | ORAL_TABLET | Freq: Every day | ORAL | 3 refills | Status: DC

## 2019-11-15 MED ORDER — TERCONAZOLE 0.4 % VA CREA: 1.0000 | TOPICAL_CREAM | Freq: Every day | VAGINAL | 3 refills | Status: DC

## 2019-11-15 NOTE — Progress Notes (Signed)
RGYN patient presents for problem visit today.    CC: Vaginal pain x a few months now and possible yeast infection. Pt notes ulcer that has been bleeding as well.  Pt states ulcer has got better than what it was but is still uncomfortable.

## 2019-11-15 NOTE — Progress Notes (Signed)
Subjective:        [name] is a 54 y.o. female here for a routine exam.  Current complaints: " Ulcer " on right lower vaginal area that was swollen and tender.  Resolved spontaneously with sitz baths and cold compresses that she found instructions for online.  Still sore but swelling is resolving.  Personal health questionnaire:  Is patient [name], have a family history of breast and/or ovarian cancer: no Is there a family history of uterine cancer diagnosed at age < 33, gastrointestinal cancer, urinary tract cancer, family member who is a Field seismologist syndrome-associated carrier: no Is the patient overweight and hypertensive, family history of diabetes, personal history of gestational diabetes, preeclampsia or PCOS: yes Is patient over 45, have PCOS,  family history of premature CHD under age 4, diabetes, smoke, have hypertension or peripheral artery disease:  no At any time, has a partner hit, kicked or otherwise hurt or frightened you?: no Over the past 2 weeks, have you felt down, depressed or hopeless?: no Over the past 2 weeks, have you felt little interest or pleasure in doing things?:no   Gynecologic History No LMP recorded. Patient has had an ablation. Contraception: tubal ligation Last Pap: 07-24-2017. Results were: normal Last mammogram: 06-06-2019. Results were: normal  Obstetric History OB History  Gravida Para Term Preterm AB Living  2       2    SAB TAB Ectopic Multiple Live Births  2            # Outcome Date GA Lbr Len/2nd Weight Sex Delivery Anes PTL Lv  2 SAB           1 SAB             Past Medical History:  Diagnosis Date  . Acute gastritis without mention of hemorrhage 01/01/2002  . Anemia 10/2009   macrocytic anemia with baseline MCV 104-106  . Anxiety   . Asthma   . Boil 01/05/2017  . 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 Complex tear of medial meniscus of left knee as current injury 02/25/2016  . Complication of anesthesia    hard to  wake up  . COPD (chronic obstructive pulmonary disease) (HCC)    chronic bronchitis  . Derangement of anterior horn of lateral meniscus of left knee 02/25/2016  . Duodenitis without mention of hemorrhage 01/01/2002  . DVT (deep venous thrombosis) (HCC)    history clot lt groin when she had lymphoma-in remision now.  . Elevated triglycerides with high cholesterol 01/05/2017  . Gastroparesis   . GERD (gastroesophageal reflux disease)   . Hiatal hernia 01/01/2002  . HIV infection ([place])    CD4 = 570 (05/2010), VL undetectation  . Hyperlipidemia   . IBS (irritable bowel syndrome)   . Migraines    on topamax and triptans, frequent (almost daily) attacks   . MRSA (methicillin resistant staph aureus) culture positive 12/20/2010  . 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 Beryle Beams is her oncologist.   . PUD (peptic ulcer disease) 07/1997   per EGD report 07/1997 with history of esophagitis  . Restless leg syndrome 12/24/2015  . Shingles    in lumbar dermatome  . Spondylolisthesis of lumbar region    L5-S1  . TMJ (dislocation of temporomandibular joint)   . Traumatic tear of lateral meniscus of right knee 04/20/2012  . Vaginal yeast infection 09/25/2017    Past Surgical History:  Procedure Laterality  Date  . CHOLECYSTECTOMY    . ENDOMETRIAL ABLATION    . KNEE ARTHROSCOPY Right 04/20/12  . KNEE ARTHROSCOPY WITH LATERAL MENISECTOMY Left 02/25/2016   Procedure: LEFT KNEE ARTHROSCOPY WITH MEDIAL AND  LATERAL MENISECTOMIES;  Surgeon: Marchia Bond, MD;  Location: [facility];  Service: Orthopedics;  Laterality: Left;  . KNEE ARTHROSCOPY WITH MEDIAL MENISECTOMY Left 02/25/2016   Procedure: KNEE ARTHROSCOPY WITH MEDIAL MENISECTOMY;  Surgeon: Marchia Bond, MD;  Location: [place];  Service: Orthopedics;  Laterality: Left;  . TONSILLECTOMY    . TYMPANOPLASTY Left   . TYMPANOSTOMY TUBE PLACEMENT     as child-both  . UPPER  GASTROINTESTINAL ENDOSCOPY  2003, 2007   done by Dr. Velora Heckler     Current Outpatient Medications:  .  albuterol (PROAIR HFA) 108 (90 Base) MCG/ACT inhaler, USE 1 TO 2 PUFFS EVERY 4-6 HOURS AS NEEDED FOR SHORTNESS OF BREATH ORWHEEZING, Disp: 8.5 g, Rfl: 1 .  Beclomethasone Dipropionate 80 MCG/ACT AERS, Place 2 Inhalers into the nose daily., Disp: 8.7 g, Rfl: 10 .  blood glucose meter kit and supplies, Dispense based on patient and insurance preference. Use up to four times daily as directed. (FOR ICD-9 250.00, 250.01)., Disp: 1 each, Rfl: 0 .  botulinum toxin Type A (BOTOX) 100 units SOLR injection, FOR OFFICE ADMINISTRATION. PHYSICIAN TO INJECT UP TO 200 UNITS INTRAMUSCULARLY INTO HEAD, FACE AND NECK AREA EVERY 90 DAYS., Disp: 2 each, Rfl: 3 .  Calcium Carbonate-Vitamin D (CALCIUM-VITAMIN D) 500-200 MG-UNIT per tablet, Take 2 tablets by mouth daily. , Disp: , Rfl:  .  diclofenac sodium (VOLTAREN) 1 % GEL, APPLY TOPICALLY TO AFFECTED AREA 4 GRAMS4 TIMES DAILY, Disp: 100 g, Rfl: 11 .  diclofenac Sodium (VOLTAREN) 1 % GEL, APPLY TOPICALLY TO AFFECTED AREA 4 GRAMS4 TIMES DAILY, Disp: 100 g, Rfl: 2 .  doxycycline (VIBRAMYCIN) 100 MG capsule, Take 1 capsule (100 mg total) by mouth 2 (two) times daily., Disp: 14 capsule, Rfl: 0 .  Erenumab-aooe (AIMOVIG) 140 MG/ML SOAJ, Inject 140 mg into the skin every 30 (thirty) days., Disp: 1 mL, Rfl: 11 .  FARXIGA 10 MG TABS tablet, TAKE 1 TABLET BY MOUTH EVERY DAY, Disp: 90 tablet, Rfl: 3 .  fluconazole (DIFLUCAN) 100 MG tablet, TAKE 1 TABLET BY MOUTH EVERY DAY, Disp: 14 tablet, Rfl: 3 .  fluconazole (DIFLUCAN) 100 MG tablet, Take 1 tablet (100 mg total) by mouth daily., Disp: 14 tablet, Rfl: 3 .  fluocinonide (LIDEX) 0.05 % external solution, 1 gtt AU qD prn itching, Disp: , Rfl:  .  glucose blood test strip, Use as instructed, Disp: 100 each, Rfl: 12 .  guaiFENesin-codeine 100-10 MG/5ML syrup, Take 5 mLs by mouth 3 (three) times daily as needed for cough., Disp:  120 mL, Rfl: 0 .  ibuprofen (ADVIL) 800 MG tablet, Take 1 tablet (800 mg total) by mouth every 8 (eight) hours as needed., Disp: 30 tablet, Rfl: 5 .  liraglutide (VICTOZA) 18 MG/3ML SOPN, Inject 0.6 mg daily for 1 week, then increase to 1.2 mg daily, Disp: 3 pen, Rfl: 0 .  lisinopril (ZESTRIL) 20 MG tablet, TAKE 1 TABLET BY MOUTH EVERY DAY, Disp: 90 tablet, Rfl: 0 .  metroNIDAZOLE (FLAGYL) 500 MG tablet, Take 1 tablet (500 mg total) by mouth 2 (two) times daily., Disp: 14 tablet, Rfl: 0 .  mometasone-formoterol (DULERA) 100-5 MCG/ACT AERO, USE 2 PUFFS BY MOUTH THE FIRST THING IN THE MORNING AND THEN ANOTHER 2 PUFFS ABOUT 12 HOURS LATER,  Disp: 13 g, Rfl: 5 .  pantoprazole (PROTONIX) 40 MG tablet, TAKE 1 TABLET BY MOUTH 2 TIMES A DAY FORACID REFLUX, Disp: 60 tablet, Rfl: 4 .  pravastatin (PRAVACHOL) 40 MG tablet, TAKE 1 TABLET BY MOUTH EVERY DAY FOR CHOLESTEROL, Disp: 90 tablet, Rfl: 1 .  sertraline (ZOLOFT) 100 MG tablet, Take 1 tablet (100 mg total) by mouth daily., Disp: 90 tablet, Rfl: 3 .  Tamsulosin HCl (FLOMAX) 0.4 MG CAPS, Take 0.4 mg by mouth daily. 1-2 daily, Disp: , Rfl:  .  terconazole (TERAZOL 7) 0.4 % vaginal cream, Place 1 applicator vaginally at bedtime., Disp: 45 g, Rfl: 3 .  tiZANidine (ZANAFLEX) 4 MG tablet, TAKE 1 TABLET BY MOUTH 2 TIMES A DAY AS NEEDED FOR MUSCLE SPASMS, Disp: 60 tablet, Rfl: 11 .  zolmitriptan (ZOMIG) 5 MG tablet, Take 1 tab at onset of migraine.  May repeat, with 1 tab, in 2 hrs, if needed.  Max dose of 2 tabs/24hrs.  Must last 30 days. #12/30, Disp: 12 tablet, Rfl: 11 .  zolpidem (AMBIEN) 10 MG tablet, TAKE 1 TABLET BY MOUTH EACH NIGHT AT BEDTIME AS NEEDED SLEEP, Disp: 30 tablet, Rfl: 5 Allergies  Allergen Reactions  . Diazepam     Triggers migraines  . Divalproex Sodium Nausea And Vomiting    Causes light-headedness  . Gabapentin Nausea And Vomiting    confusion  . Ondansetron Hcl Nausea And Vomiting    Triggers migraines, "makes me vomit"  .  Penicillins     REACTION: lips swollen to point of bleeding, sever vaginal irritation  Reports tolerating augmentin well without any complaints or reaction  . Pineapple   . Propoxyphene N-Acetaminophen Nausea And Vomiting  . Simvastatin     PATIENT CANNOT BE PRESCRIBED THIS STATIN WHILE RECEIVING NORVIR  . Topamax [Topiramate]     Migraines   . Tramadol Hcl Itching    REACTION: migraines and itching--hx of migraines even without medication   . Zocor [Simvastatin - High Dose]     Social History   Tobacco Use  . Smoking status: Never Smoker  . Smokeless tobacco: Never Used  Substance Use Topics  . Alcohol use: No    Alcohol/week: 0.0 standard drinks    Family History  Problem Relation Age of Onset  . Hypertension Mother   . Stroke Mother   . Heart disease Mother   . Diabetes Mother   . Breast cancer Paternal Grandmother   . Kidney cancer Maternal Uncle   . Heart disease Maternal Uncle   . Diabetes Maternal Uncle   . Diabetes Maternal Grandmother   . Heart disease Maternal Grandmother   . Emphysema Paternal Uncle        never smoker  . Emphysema Paternal Grandfather        never smoker  . Colon cancer Paternal Aunt        with met to lung   . Non-Hodgkin's lymphoma Maternal Uncle   . Heart disease Maternal Grandfather       Review of Systems  Constitutional: negative for fatigue and weight loss Respiratory: negative for cough and wheezing Cardiovascular: negative for chest pain, fatigue and palpitations Gastrointestinal: negative for abdominal pain and change in bowel habits Musculoskeletal:negative for myalgias Neurological: negative for gait problems and tremors Behavioral/Psych: negative for abusive relationship, depression Endocrine: negative for temperature intolerance    Genitourinary:negative for abnormal menstrual periods, genital lesions, hot flashes, sexual problems and vaginal discharge.  Positive for resolving infected Bartholin  Cyst Integument/breast: negative  for breast lump, breast tenderness, nipple discharge and skin lesion(s)    Objective:       BP 132/78   Pulse (!) 110   Wt 279 lb (126.6 kg)   BMI 45.03 kg/m            General:  Alert and no distress Abdomen:  normal findings: no organomegaly, soft, non-tender and no hernia  Pelvis:  External genitalia: normal general appearance Urinary system: urethral meatus normal and bladder without fullness, nontender Vaginal: mild erythema and tenderness in right Bartholin area, no drainage Cervix: normal appearance Adnexa: could not feel due to obesity Uterus: could not feel due to obesity   Lab Review Urine pregnancy test Labs reviewed yes Radiologic studies reviewed yes  50% of 25 min visit spent on counseling and coordination of care.   Assessment:     1. Encounter for routine gynecological examination with Papanicolaou smear of cervix  2. Cyst of right Bartholin's gland, resolving Rx: - doxycycline (VIBRAMYCIN) 100 MG capsule; Take 1 capsule (100 mg total) by mouth 2 (two) times daily.  Dispense: 14 capsule; Refill: 0 - metroNIDAZOLE (FLAGYL) 500 MG tablet; Take 1 tablet (500 mg total) by mouth 2 (two) times daily.  Dispense: 14 tablet; Refill: 0 - ibuprofen (ADVIL) 800 MG tablet; Take 1 tablet (800 mg total) by mouth every 8 (eight) hours as needed.  Dispense: 30 tablet; Refill: 5  3. Vaginal discharge Rx: - Cervicovaginal ancillary only( [place])  4. Candida vaginitis Rx: - fluconazole (DIFLUCAN) 100 MG tablet; Take 1 tablet (100 mg total) by mouth daily.  Dispense: 14 tablet; Refill: 3 - terconazole (TERAZOL 7) 0.4 % vaginal cream; Place 1 applicator vaginally at bedtime.  Dispense: 45 g; Refill: 3  5. Dysuria Rx: - Urine Culture  6. 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 Screening for cervical cancer Rx: - Cytology - PAP( [place])    Plan:    Education reviewed: calcium supplements, depression  evaluation, low fat, low cholesterol diet, safe sex/STD prevention, self breast exams and weight bearing exercise. Follow up in: 2 weeks.   Meds ordered this encounter  Medications  . fluconazole (DIFLUCAN) 100 MG tablet    Sig: Take 1 tablet (100 mg total) by mouth daily.    Dispense:  14 tablet    Refill:  3  . terconazole (TERAZOL 7) 0.4 % vaginal cream    Sig: Place 1 applicator vaginally at bedtime.    Dispense:  45 g    Refill:  3  . doxycycline (VIBRAMYCIN) 100 MG capsule    Sig: Take 1 capsule (100 mg total) by mouth 2 (two) times daily.    Dispense:  14 capsule    Refill:  0  . metroNIDAZOLE (FLAGYL) 500 MG tablet    Sig: Take 1 tablet (500 mg total) by mouth 2 (two) times daily.    Dispense:  14 tablet    Refill:  0  . ibuprofen (ADVIL) 800 MG tablet    Sig: Take 1 tablet (800 mg total) by mouth every 8 (eight) hours as needed.    Dispense:  30 tablet    Refill:  5   Orders Placed This Encounter  Procedures  . Urine Culture    Shelly Bombard, MD 11/15/2019 10:43 AM

## 2019-11-17 LAB — URINE CULTURE

## 2019-11-18 LAB — CERVICOVAGINAL ANCILLARY ONLY
Bacterial Vaginitis (gardnerella): NEGATIVE
Candida Glabrata: POSITIVE — AB
Candida Vaginitis: NEGATIVE
Chlamydia: NEGATIVE
Comment: NEGATIVE
Comment: NEGATIVE
Comment: NEGATIVE
Comment: NEGATIVE
Comment: NEGATIVE
Comment: NORMAL
Neisseria Gonorrhea: NEGATIVE
Trichomonas: NEGATIVE

## 2019-11-18 LAB — CYTOLOGY - PAP
Comment: NEGATIVE
Diagnosis: NEGATIVE
High risk HPV: NEGATIVE

## 2019-11-25 ENCOUNTER — Ambulatory Visit: Payer: Medicaid Other | Admitting: Gastroenterology

## 2019-11-25 ENCOUNTER — Telehealth: Payer: Self-pay | Admitting: *Deleted

## 2019-11-25 ENCOUNTER — Encounter: Payer: Self-pay | Admitting: Gastroenterology

## 2019-11-25 VITALS — BP 128/78 | HR 87 | Ht 66.0 in | Wt 276.6 lb

## 2019-11-25 DIAGNOSIS — R140 Abdominal distension (gaseous): Secondary | ICD-10-CM | POA: Diagnosis not present

## 2019-11-25 DIAGNOSIS — K5900 Constipation, unspecified: Secondary | ICD-10-CM

## 2019-11-25 DIAGNOSIS — K219 Gastro-esophageal reflux disease without esophagitis: Secondary | ICD-10-CM | POA: Diagnosis not present

## 2019-11-25 MED ORDER — TRULANCE 3 MG PO TABS: 1.0000 | ORAL_TABLET | Freq: Every day | ORAL | 11 refills | Status: DC

## 2019-11-25 NOTE — Telephone Encounter (Signed)
PA submitted and approved via [employer] [place] [employer]. Faxed approval to [facility].

## 2019-11-25 NOTE — Progress Notes (Signed)
History of Present Illness: This is a 54 year old female referred by Ina Homes, MD for the evaluation of chronic constipation, abdominal bloating, GERD, chronic nausea and vomiting.  Patient relates a very long history of constipation that has gradually worsened over many years.  She states Linzess was tried and was ineffective.  MiraLAX was ineffective.  She takes Dulcolax on a almost daily basis and relates that it is minimally effective.  She notes frequent reflux symptoms despite taking a PPI twice daily. She states she has intermittent nausea, vomiting at various times during the day, several days per week for many years.  She underwent colonoscopy and EGD by Dr. Ardis Hughs for constipation, abdominal pain, nausea and vomiting in July 2011 and both were normal except for internal and external hemorrhoids.  She states her hemorrhoids are frequently uncomfortable.  A 2-hour duration gastric emptying scan in June 2007 revealed borderline delayed emptying with 29% retained at 2 hours. Denies weight loss, abdominal pain,  diarrhea, change in stool caliber, melena, hematochezia, nausea, chest pain.     Allergies  Allergen Reactions  . Diazepam     Triggers migraines  . Divalproex Sodium Nausea And Vomiting    Causes light-headedness  . Gabapentin Nausea And Vomiting    confusion  . Ondansetron Hcl Nausea And Vomiting    Triggers migraines, "makes me vomit"  . Penicillins     REACTION: lips swollen to point of bleeding, sever vaginal irritation  Reports tolerating augmentin well without any complaints or reaction  . Pineapple   . Propoxyphene N-Acetaminophen Nausea And Vomiting  . Simvastatin     PATIENT CANNOT BE PRESCRIBED THIS STATIN WHILE RECEIVING NORVIR  . Topamax [Topiramate]     Migraines   . Tramadol Hcl Itching    REACTION: migraines and itching--hx of migraines even without medication   . Zocor [Simvastatin - High Dose]    Outpatient Medications Prior to Visit    Medication Sig Dispense Refill  . albuterol (PROAIR HFA) 108 (90 Base) MCG/ACT inhaler USE 1 TO 2 PUFFS EVERY 4-6 HOURS AS NEEDED FOR SHORTNESS OF BREATH ORWHEEZING 8.5 g 1  . Beclomethasone Dipropionate 80 MCG/ACT AERS Place 2 Inhalers into the nose daily. 8.7 g 10  . blood glucose meter kit and supplies Dispense based on patient and insurance preference. Use up to four times daily as directed. (FOR ICD-9 250.00, 250.01). 1 each 0  . botulinum toxin Type A (BOTOX) 100 units SOLR injection FOR OFFICE ADMINISTRATION. PHYSICIAN TO INJECT UP TO 200 UNITS INTRAMUSCULARLY INTO HEAD, FACE AND NECK AREA EVERY 90 DAYS. 2 each 3  . Calcium Carbonate-Vitamin D (CALCIUM-VITAMIN D) 500-200 MG-UNIT per tablet Take 2 tablets by mouth daily.     . diclofenac sodium (VOLTAREN) 1 % GEL APPLY TOPICALLY TO AFFECTED AREA 4 GRAMS4 TIMES DAILY 100 g 11  . diclofenac Sodium (VOLTAREN) 1 % GEL APPLY TOPICALLY TO AFFECTED AREA 4 GRAMS4 TIMES DAILY 100 g 2  . Erenumab-aooe (AIMOVIG) 140 MG/ML SOAJ Inject 140 mg into the skin every 30 (thirty) days. 1 mL 11  . FARXIGA 10 MG TABS tablet TAKE 1 TABLET BY MOUTH EVERY DAY 90 tablet 3  . fluconazole (DIFLUCAN) 100 MG tablet Take 1 tablet (100 mg total) by mouth daily. 14 tablet 3  . fluocinonide (LIDEX) 0.05 % external solution 1 gtt AU qD prn itching    . glucose blood test strip Use as instructed 100 each 12  . guaiFENesin-codeine 100-10 MG/5ML syrup Take 5 mLs by  mouth 3 (three) times daily as needed for cough. 120 mL 0  . ibuprofen (ADVIL) 800 MG tablet Take 1 tablet (800 mg total) by mouth every 8 (eight) hours as needed. 30 tablet 5  . liraglutide (VICTOZA) 18 MG/3ML SOPN Inject 0.6 mg daily for 1 week, then increase to 1.2 mg daily 3 pen 0  . lisinopril (ZESTRIL) 20 MG tablet TAKE 1 TABLET BY MOUTH EVERY DAY 90 tablet 0  . metroNIDAZOLE (FLAGYL) 500 MG tablet Take 1 tablet (500 mg total) by mouth 2 (two) times daily. 14 tablet 0  . mometasone-formoterol (DULERA) 100-5  MCG/ACT AERO USE 2 PUFFS BY MOUTH THE FIRST THING IN THE MORNING AND THEN ANOTHER 2 PUFFS ABOUT 12 HOURS LATER 13 g 5  . pantoprazole (PROTONIX) 40 MG tablet TAKE 1 TABLET BY MOUTH 2 TIMES A DAY FORACID REFLUX 60 tablet 4  . pravastatin (PRAVACHOL) 40 MG tablet TAKE 1 TABLET BY MOUTH EVERY DAY FOR CHOLESTEROL 90 tablet 1  . sertraline (ZOLOFT) 100 MG tablet Take 1 tablet (100 mg total) by mouth daily. 90 tablet 3  . Tamsulosin HCl (FLOMAX) 0.4 MG CAPS Take 0.4 mg by mouth daily. 1-2 daily    . terconazole (TERAZOL 7) 0.4 % vaginal cream Place 1 applicator vaginally at bedtime. 45 g 3  . tiZANidine (ZANAFLEX) 4 MG tablet TAKE 1 TABLET BY MOUTH 2 TIMES A DAY AS NEEDED FOR MUSCLE SPASMS 60 tablet 11  . zolmitriptan (ZOMIG) 5 MG tablet Take 1 tab at onset of migraine.  May repeat, with 1 tab, in 2 hrs, if needed.  Max dose of 2 tabs/24hrs.  Must last 30 days. #12/30 12 tablet 11  . zolpidem (AMBIEN) 10 MG tablet TAKE 1 TABLET BY MOUTH EACH NIGHT AT BEDTIME AS NEEDED SLEEP 30 tablet 5  . doxycycline (VIBRAMYCIN) 100 MG capsule Take 1 capsule (100 mg total) by mouth 2 (two) times daily. 14 capsule 0  . fluconazole (DIFLUCAN) 100 MG tablet TAKE 1 TABLET BY MOUTH EVERY DAY 14 tablet 3   No facility-administered medications prior to visit.   Past Medical History:  Diagnosis Date  . Acute gastritis without mention of hemorrhage 01/01/2002  . Anemia 10/2009   macrocytic anemia with baseline MCV 104-106  . Anxiety   . Asthma   . Boil 01/05/2017  . 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 Complex tear of medial meniscus of left knee as current injury 02/25/2016  . Complication of anesthesia    hard to wake up  . COPD (chronic obstructive pulmonary disease) (HCC)    chronic bronchitis  . Derangement of anterior horn of lateral meniscus of left knee 02/25/2016  . Duodenitis without mention of hemorrhage 01/01/2002  . DVT (deep venous thrombosis) (HCC)    history clot lt groin when she had lymphoma-in  remision now.  . Elevated triglycerides with high cholesterol 01/05/2017  . Gastroparesis   . GERD (gastroesophageal reflux disease)   . Hiatal hernia 01/01/2002  . HIV infection ([place])    CD4 = 570 (05/2010), VL undetectation  . Hyperlipidemia   . IBS (irritable bowel syndrome)   . Migraines    on topamax and triptans, frequent (almost daily) attacks   . MRSA (methicillin resistant staph aureus) culture positive 12/20/2010  . 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 Beryle Beams is her oncologist.   . PUD (peptic ulcer disease) 07/1997   per EGD report 07/1997 with history of esophagitis  .  Restless leg syndrome 12/24/2015  . Shingles    in lumbar dermatome  . Spondylolisthesis of lumbar region    L5-S1  . TMJ (dislocation of temporomandibular joint)   . Traumatic tear of lateral meniscus of right knee 04/20/2012  . Vaginal yeast infection 09/25/2017   Past Surgical History:  Procedure Laterality Date  . CHOLECYSTECTOMY    . ENDOMETRIAL ABLATION    . KNEE ARTHROSCOPY Right 04/20/12  . KNEE ARTHROSCOPY WITH LATERAL MENISECTOMY Left 02/25/2016   Procedure: LEFT KNEE ARTHROSCOPY WITH MEDIAL AND  LATERAL MENISECTOMIES;  Surgeon: Marchia Bond, MD;  Location: [facility];  Service: Orthopedics;  Laterality: Left;  . KNEE ARTHROSCOPY WITH MEDIAL MENISECTOMY Left 02/25/2016   Procedure: KNEE ARTHROSCOPY WITH MEDIAL MENISECTOMY;  Surgeon: Marchia Bond, MD;  Location: [facility];  Service: Orthopedics;  Laterality: Left;  . TONSILLECTOMY    . TYMPANOPLASTY Left   . TYMPANOSTOMY TUBE PLACEMENT     as child-both  . UPPER GASTROINTESTINAL ENDOSCOPY  2003, 2007   done by Dr. Velora Heckler   Social History   Socioeconomic History  . Marital status: Divorced    Spouse name: Not on file  . Number of children: 0  . Years of education: 71  . Highest education level: Not on file  Occupational History  . Occupation: disabled      Fish farm manager: UNEMPLOYED  Tobacco Use  . Smoking status: Never Smoker  . Smokeless tobacco: Never Used  Substance and Sexual Activity  . Alcohol use: No    Alcohol/week: 0.0 standard drinks  . Drug use: No  . Sexual activity: Not Currently    Partners: Male    Birth control/protection: Surgical  Other Topics Concern  . Not on file  Social History Narrative      Patient is disabled.   Right handed.   Caffeine - Diet soda., tea   Education - high school   Social Determinants of Health   Financial Resource Strain:   . Difficulty of Paying Living Expenses:   Food Insecurity:   . Worried About Charity fundraiser in the Last Year:   . Arboriculturist in the Last Year:   Transportation Needs:   . Film/video editor (Medical):   Marland Kitchen Lack of Transportation (Non-Medical):   Physical Activity:   . Days of Exercise per Week:   . Minutes of Exercise per Session:   Stress:   . Feeling of Stress :   Social Connections:   . Frequency of Communication with Friends and Family:   . Frequency of Social Gatherings with Friends and Family:   . Attends Religious Services:   . Active Member of Clubs or Organizations:   . Attends Archivist Meetings:   Marland Kitchen Marital Status:    Family History  Problem Relation Age of Onset  . Hypertension Mother   . Stroke Mother   . Heart disease Mother   . Diabetes Mother   . Colon polyps Mother   . Breast cancer Paternal Grandmother   . Kidney cancer Maternal Uncle   . Heart disease Maternal Uncle   . Diabetes Maternal Uncle   . Diabetes Maternal Grandmother   . Heart disease Maternal Grandmother   . Emphysema Paternal Uncle        never smoker  . Emphysema Paternal Grandfather        never smoker  . Colon cancer Paternal Aunt        with met to lung   .  Non-Hodgkin's lymphoma Maternal Uncle   . Heart disease Maternal Grandfather       Review of Systems: Pertinent positive and negative review of systems were noted in the above HPI  section. All other review of systems were otherwise negative.   Physical Exam: General: Well developed, well nourished, obese, no acute distress Head: Normocephalic and atraumatic Eyes:  sclerae anicteric, EOMI Ears: Normal auditory acuity Mouth: Not examined, mask on during Covid-19 pandemic Neck: Supple, no masses or thyromegaly Lungs: Clear throughout to auscultation Heart: Regular rate and rhythm; no murmurs, rubs or bruits Abdomen: Soft, large, mild diffuse tenderness and non distended. No masses, hepatosplenomegaly or hernias noted. Normal Bowel sounds Rectal: Not done, deferred to colonoscopy Musculoskeletal: Symmetrical with no gross deformities  Skin: No lesions on visible extremities Pulses:  Normal pulses noted Extremities: No clubbing, cyanosis, edema or deformities noted Neurological: Alert oriented x 4, grossly nonfocal Cervical Nodes:  No significant cervical adenopathy Inguinal Nodes: No significant inguinal adenopathy Psychological:  Alert and cooperative. Normal mood and affect   Assessment and Recommendations:  1.  Chronic constipation, chronic abdominal bloating. Worsening gradually over many years. Linzess and Miralax were not effective.  Clear liquid diet and bowel purge with a colonoscopy prep.  Following that begin Trulance 3 mg daily.  If ineffective add Dulcolax once or twice daily to Trulance daily.  Recommended proceeding with colonoscopy when her constipation is under better control with an extended bowel prep.  REV in 3-4 weeks.   2.  Chronic nausea and vomiting.  Etiology unclear.  Possible gastroparesis.  Symptoms do not appear to secondary to GERD although GERD control is likely related.  Recommend EGD for further evaluation when she is able to adequately prep for colonoscopy so both procedures can be performed on the same day.  Pending EGD findings consider a 4-hour gastric emptying scan. REV in 3-4 weeks.   3.  Internal and external hemorrhoids.   Improve management of constipation as above.  Preparation H coated with 1% hydrocortisone cream twice daily as needed.  Standard rectal care instructions.  4.  GERD.  Follow all standard antireflux measures.  Continue pantoprazole 40 mg twice daily.  EGD as in #2 for further evaluation.   cc: Ina Homes, MD [address] [place],  [place] [address]

## 2019-11-25 NOTE — Patient Instructions (Signed)
Dr Fuller Plan recommends that you complete a bowel purge (to clean out your bowels). Please do the following: The day of the bowel purge remain on clear liquids entire day. Start Sutab sample at 12:00pm, doing step 1-4. Remain on clear liquids entire day.  Repeat steps 1-4 at 6:00pm on the same day.   After you have finished the bowel purge start the next day taking samples of Trulance. Take one tablet by mouth daily. A prescription has been sent to your pharmacy.   You can use over the counter hydrocortisone cream twice daily for hemorrhoids.   Thank you for choosing me and [facility].  Pricilla Riffle. Dagoberto Ligas., MD., [facility]

## 2019-12-04 ENCOUNTER — Ambulatory Visit: Payer: Medicaid Other | Admitting: Obstetrics and Gynecology

## 2019-12-10 ENCOUNTER — Telehealth: Payer: Self-pay | Admitting: *Deleted

## 2019-12-10 ENCOUNTER — Other Ambulatory Visit: Payer: Self-pay | Admitting: Neurology

## 2019-12-10 ENCOUNTER — Other Ambulatory Visit: Payer: Self-pay | Admitting: Internal Medicine

## 2019-12-10 DIAGNOSIS — I10 Essential (primary) hypertension: Secondary | ICD-10-CM

## 2019-12-10 NOTE — Telephone Encounter (Signed)
PA for zolmitriptan completed over the phone with [facility]. Pt DQ#[id number] S. Decision pending. JJ#[id number].

## 2019-12-10 NOTE — Telephone Encounter (Addendum)
Approved through 12/04/2020. [facility] notified that they can now fill this prescription for the patient.

## 2019-12-13 ENCOUNTER — Ambulatory Visit: Payer: Medicaid Other | Admitting: Nurse Practitioner

## 2019-12-23 ENCOUNTER — Ambulatory Visit: Payer: Medicaid Other | Admitting: Obstetrics and Gynecology

## 2019-12-23 ENCOUNTER — Other Ambulatory Visit: Payer: Self-pay

## 2019-12-23 ENCOUNTER — Encounter: Payer: Self-pay | Admitting: Obstetrics and Gynecology

## 2019-12-23 VITALS — BP 151/89 | HR 101 | Wt 281.0 lb

## 2019-12-23 DIAGNOSIS — T300 Burn of unspecified body region, unspecified degree: Secondary | ICD-10-CM

## 2019-12-23 DIAGNOSIS — Z87828 Personal history of other (healed) physical injury and trauma: Secondary | ICD-10-CM

## 2019-12-23 DIAGNOSIS — N750 Cyst of Bartholin's gland: Secondary | ICD-10-CM | POA: Diagnosis not present

## 2019-12-23 MED ORDER — SILVER SULFADIAZINE 1 % EX CREA
1.0000 | TOPICAL_CREAM | Freq: Every day | CUTANEOUS | 0 refills | Status: DC
Start: 2019-12-23 — End: 2020-01-15

## 2019-12-23 MED ORDER — FLUCONAZOLE 150 MG PO TABS
150.0000 mg | ORAL_TABLET | Freq: Once | ORAL | 0 refills | Status: AC
Start: 2019-12-23 — End: 2019-12-23

## 2019-12-23 MED ORDER — BORIC ACID POWD: 6 refills | Status: DC

## 2019-12-23 NOTE — Progress Notes (Signed)
Patient here for follow up on Bartholyn cyst. Patient reports feeling well without any further discomforts. She admits to vaginal pruritis and is concerned about the return of a yeast infection. Patient also had a skin bun on her abdomen a few weeks ago from a heating pad. Patient is without any other complaints  Past Medical History:  Diagnosis Date  . Acute gastritis without mention of hemorrhage 01/01/2002  . Anemia 10/2009   macrocytic anemia with baseline MCV 104-106  . Anxiety   . Asthma   . Boil 01/05/2017  . 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 Complex tear of medial meniscus of left knee as current injury 02/25/2016  . Complication of anesthesia    hard to wake up  . COPD (chronic obstructive pulmonary disease) (HCC)    chronic bronchitis  . Derangement of anterior horn of lateral meniscus of left knee 02/25/2016  . Duodenitis without mention of hemorrhage 01/01/2002  . DVT (deep venous thrombosis) (HCC)    history clot lt groin when she had lymphoma-in remision now.  . Elevated triglycerides with high cholesterol 01/05/2017  . Gastroparesis   . GERD (gastroesophageal reflux disease)   . Hiatal hernia 01/01/2002  . HIV infection ([place])    CD4 = 570 (05/2010), VL undetectation  . Hyperlipidemia   . IBS (irritable bowel syndrome)   . Migraines    on topamax and triptans, frequent (almost daily) attacks   . MRSA (methicillin resistant staph aureus) culture positive 12/20/2010  . 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 Beryle Beams is her oncologist.   . PUD (peptic ulcer disease) 07/1997   per EGD report 07/1997 with history of esophagitis  . Restless leg syndrome 12/24/2015  . Shingles    in lumbar dermatome  . Spondylolisthesis of lumbar region    L5-S1  . TMJ (dislocation of temporomandibular joint)   . Traumatic tear of lateral meniscus of right knee 04/20/2012  . Vaginal yeast infection 09/25/2017   Past Surgical History:   Procedure Laterality Date  . CHOLECYSTECTOMY    . ENDOMETRIAL ABLATION    . KNEE ARTHROSCOPY Right 04/20/12  . KNEE ARTHROSCOPY WITH LATERAL MENISECTOMY Left 02/25/2016   Procedure: LEFT KNEE ARTHROSCOPY WITH MEDIAL AND  LATERAL MENISECTOMIES;  Surgeon: Marchia Bond, MD;  Location: [facility];  Service: Orthopedics;  Laterality: Left;  . KNEE ARTHROSCOPY WITH MEDIAL MENISECTOMY Left 02/25/2016   Procedure: KNEE ARTHROSCOPY WITH MEDIAL MENISECTOMY;  Surgeon: Marchia Bond, MD;  Location: [facility];  Service: Orthopedics;  Laterality: Left;  . TONSILLECTOMY    . TYMPANOPLASTY Left   . TYMPANOSTOMY TUBE PLACEMENT     as child-both  . UPPER GASTROINTESTINAL ENDOSCOPY  2003, 2007   done by Dr. Velora Heckler   Family History  Problem Relation Age of Onset  . Hypertension Mother   . Stroke Mother   . Heart disease Mother   . Diabetes Mother   . Colon polyps Mother   . Breast cancer Paternal Grandmother   . Kidney cancer Maternal Uncle   . Heart disease Maternal Uncle   . Diabetes Maternal Uncle   . Diabetes Maternal Grandmother   . Heart disease Maternal Grandmother   . Emphysema Paternal Uncle        never smoker  . Emphysema Paternal Grandfather        never smoker  . Colon cancer Paternal Aunt        with met to lung   .  Non-Hodgkin's lymphoma Maternal Uncle   . Heart disease Maternal Grandfather    Social History   Tobacco Use  . Smoking status: Never Smoker  . Smokeless tobacco: Never Used  Vaping Use  . Vaping Use: Never used  Substance Use Topics  . Alcohol use: No    Alcohol/week: 0.0 standard drinks  . Drug use: No   ROS See pertinent in HPI. All other systems reviewed and negative  Blood pressure (!) 151/89, pulse (!) 101, weight 281 lb (127.5 kg). GENERAL: Well-developed, well-nourished female in no acute distress.  ABDOMEN: Soft, nontender, nondistended. No organomegaly. 1 cm well circumscribed lesion on abdomen with surrounding  erythema PELVIC: Normal external female genitalia. Bartholin gland not visualized. Vagina is pink and rugated.   EXTREMITIES: No cyanosis, clubbing, or edema, 2+ distal pulses.  A/P 54 yo here for follow up - Rx silvade cream provided - Rx diflucan and refill on boric acid - RTC prn

## 2019-12-23 NOTE — Progress Notes (Signed)
Patient presents for F/U visit today.   Pt last seen 11/25/19 for Cyst Right Bartholins Gland pt states area has healed however still noticing vaginal itching  pt has a burn on her abdomen area she wants provider to look at.

## 2019-12-26 ENCOUNTER — Ambulatory Visit: Payer: Medicaid Other | Admitting: Internal Medicine

## 2019-12-26 VITALS — BP 129/75 | HR 108 | Temp 97.8°F | Ht 66.0 in | Wt 280.0 lb

## 2019-12-26 DIAGNOSIS — E119 Type 2 diabetes mellitus without complications: Secondary | ICD-10-CM

## 2019-12-26 DIAGNOSIS — I10 Essential (primary) hypertension: Secondary | ICD-10-CM

## 2019-12-26 DIAGNOSIS — J45909 Unspecified asthma, uncomplicated: Secondary | ICD-10-CM

## 2019-12-26 DIAGNOSIS — K5900 Constipation, unspecified: Secondary | ICD-10-CM | POA: Diagnosis not present

## 2019-12-26 DIAGNOSIS — S83281A Other tear of lateral meniscus, current injury, right knee, initial encounter: Secondary | ICD-10-CM

## 2019-12-26 DIAGNOSIS — R112 Nausea with vomiting, unspecified: Secondary | ICD-10-CM

## 2019-12-26 DIAGNOSIS — S83281S Other tear of lateral meniscus, current injury, right knee, sequela: Secondary | ICD-10-CM

## 2019-12-26 LAB — POCT GLYCOSYLATED HEMOGLOBIN (HGB A1C): Hemoglobin A1C: 7.7 % — AB (ref 4.0–5.6)

## 2019-12-26 LAB — GLUCOSE, CAPILLARY: Glucose-Capillary: 194 mg/dL — ABNORMAL HIGH (ref 70–99)

## 2019-12-26 MED ORDER — DICLOFENAC SODIUM 1 % EX GEL: CUTANEOUS | 11 refills | Status: DC

## 2019-12-26 MED ORDER — GUAIFENESIN-CODEINE 100-10 MG/5ML PO SOLN: 5.0000 mL | Freq: Three times a day (TID) | ORAL | 0 refills | Status: DC | PRN

## 2019-12-26 NOTE — Assessment & Plan Note (Signed)
Patient is continuing to follow with gastroenterology. She is currently on Plecanatide 3 mg QD.

## 2019-12-26 NOTE — Assessment & Plan Note (Signed)
Hypertension is currently well controlled on lisinopril 20 mg once daily. Last BMP with stable renal function. She is tolerating the medication well without any adverse effects. Will continue lisinopril 20 mg once daily.

## 2019-12-26 NOTE — Progress Notes (Signed)
   CC: DM, HTN, N/V, Constipation, Yeast infection   HPI:  [name] is a 54 y.o. female with PMHx listed below presenting for DM, HTN, N/V, Constipation, Yeast infection. Please see the A&P for the status of the patient's chronic medical problems.  Past Medical History:  Diagnosis Date  . Acute gastritis without mention of hemorrhage 01/01/2002  . Anemia 10/2009   macrocytic anemia with baseline MCV 104-106  . Anxiety   . Asthma   . Boil 01/05/2017  . 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 Complex tear of medial meniscus of left knee as current injury 02/25/2016  . Complication of anesthesia    hard to wake up  . COPD (chronic obstructive pulmonary disease) (HCC)    chronic bronchitis  . Derangement of anterior horn of lateral meniscus of left knee 02/25/2016  . Duodenitis without mention of hemorrhage 01/01/2002  . DVT (deep venous thrombosis) (HCC)    history clot lt groin when she had lymphoma-in remision now.  . Elevated triglycerides with high cholesterol 01/05/2017  . Gastroparesis   . GERD (gastroesophageal reflux disease)   . Hiatal hernia 01/01/2002  . HIV infection ([place])    CD4 = 570 (05/2010), VL undetectation  . Hyperlipidemia   . IBS (irritable bowel syndrome)   . Migraines    on topamax and triptans, frequent (almost daily) attacks   . MRSA (methicillin resistant staph aureus) culture positive 12/20/2010  . 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 Beryle Beams is her oncologist.   . PUD (peptic ulcer disease) 07/1997   per EGD report 07/1997 with history of esophagitis  . Restless leg syndrome 12/24/2015  . Shingles    in lumbar dermatome  . Spondylolisthesis of lumbar region    L5-S1  . TMJ (dislocation of temporomandibular joint)   . Traumatic tear of lateral meniscus of right knee 04/20/2012  . Vaginal yeast infection 09/25/2017   Review of Systems:  Performed and all others negative.  Physical  Exam: Vitals:   12/26/19 0953  BP: 129/75  Pulse: (!) 108  Temp: 97.8 F (36.6 C)  TempSrc: Oral  SpO2: 97%  Weight: 280 lb (127 kg)  Height: 5\' 6"  (1.676 m)   General: Obese female in no acute distress Pulm: Good air movement with no wheezing or crackles  CV: RRR, no murmurs, no rubs   Assessment & Plan:   See Encounters Tab for problem based charting.  Patient discussed with Dr. Rebeca Alert

## 2019-12-26 NOTE — Patient Instructions (Signed)
Thank you for allowing me to provide your care. I have sent out the cough medicine and the Voltaren gel with 11 refills. Please come back to see [place] in three months or sooner if any issues arise.

## 2019-12-26 NOTE — Assessment & Plan Note (Signed)
Patient needs a refill on her Voltaren gel. 11 refill sent out.

## 2019-12-26 NOTE — Assessment & Plan Note (Signed)
Patient with uncontrolled diabetes. A1c is currently 7.7. She is on [place]. She continues to have yeast infections. Discussed discontinuing the [place] and switching to a insulin/GLP combination injection. She declined him will follow-up with her endocrinologist.

## 2019-12-26 NOTE — Assessment & Plan Note (Signed)
Patient continues to have 3 to 4 days of nausea/vomiting per week. She thinks that this is related to her Victoza. She states that her endocrinologist is managing this.

## 2019-12-26 NOTE — Assessment & Plan Note (Signed)
>>  ASSESSMENT AND PLAN FOR TYPE 2 DIABETES MELLITUS (HCC) WRITTEN ON 12/26/2019 10:34 AM BY HELBERG, JUSTIN, MD  Patient with uncontrolled diabetes. A1c is currently 7.7. She is on [place] and Victoza. She continues to have yeast infections. Discussed discontinuing the [place] and switching to a insulin/GLP combination injection. She declined him will follow-up with her endocrinologist.

## 2019-12-27 NOTE — Progress Notes (Signed)
Internal Medicine Clinic Attending  Case discussed with Dr. Helberg at the time of the visit.  We reviewed the resident's history and exam and pertinent patient test results.  I agree with the assessment, diagnosis, and plan of care documented in the resident's note.  Nazyia Gaugh, M.D., Ph.D.  

## 2020-01-07 ENCOUNTER — Other Ambulatory Visit: Payer: Self-pay | Admitting: Internal Medicine

## 2020-01-07 DIAGNOSIS — E118 Type 2 diabetes mellitus with unspecified complications: Secondary | ICD-10-CM

## 2020-01-09 ENCOUNTER — Telehealth: Payer: Self-pay | Admitting: Student

## 2020-01-09 NOTE — Telephone Encounter (Signed)
Pt is requesting to call back regarding her amiben, insurance only will pay for [phone]

## 2020-01-13 ENCOUNTER — Telehealth: Payer: Self-pay | Admitting: Neurology

## 2020-01-13 NOTE — Telephone Encounter (Signed)
Pt is asking for a call to be scheduled for an infusion

## 2020-01-14 ENCOUNTER — Telehealth: Payer: Self-pay | Admitting: *Deleted

## 2020-01-14 NOTE — Telephone Encounter (Addendum)
Information was sent though CoverMyMeds for PA for Zolpidem 10 mg tablets #30.   Awaiting determination within 24-72 hours.  Sander Nephew, RN 7/06/05/2020 10:47 AM. PA for Zolpidem was approved 01/14/2020 through 07/16/2020. [place] [id number].  Sander Nephew, RN 01/22/2020 11:50 AM.

## 2020-01-14 NOTE — Telephone Encounter (Signed)
Pt returned call and states that she will come in 7/14  At 1:45 pm. Apt made for nerve blocks for the patient per Dr. Krista Blue.

## 2020-01-14 NOTE — Telephone Encounter (Signed)
Please let patient know, Depacon is in back order,  If she wish, she may come in for nerve block, it is okay to add-on for tomorrow (01/15/2020) morning or early afternoon

## 2020-01-14 NOTE — Telephone Encounter (Signed)
Called the patient back to get more information on what is going on. 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  She is taking her preventative medication and using the rizatriptan but the migraine comes right back and she is unable to get relief.   In reviewing chart patient received an infusion in  April 28,2021-1) VPA 1000mg  2) Toradol 30mg   April 29th had an office visit and received a nerve block. had her botox 11/06/19.  Advised the patient I would make Dr Krista Blue aware and see what she recommends for her and whether another infusion is beneficial.

## 2020-01-14 NOTE — Telephone Encounter (Signed)
Called the patient and there was no answer. Left a detailed message advising the patient that depacon infusion is on back order and that Dr Krista Blue would suggest completing a nerve block. Advised the patient to call back so she may be worked in tomorrow for nerve block.   When patient calls back we can offer her to come in 7:45 am or 1:15 pm. I will most likely have to create an opening for the patient. I can try and schedule the patient if available or message me to advise what time options.

## 2020-01-15 ENCOUNTER — Ambulatory Visit (INDEPENDENT_AMBULATORY_CARE_PROVIDER_SITE_OTHER): Payer: Medicaid Other | Admitting: Neurology

## 2020-01-15 ENCOUNTER — Other Ambulatory Visit: Payer: Self-pay

## 2020-01-15 VITALS — BP 124/72 | HR 88 | Ht 66.0 in | Wt 283.5 lb

## 2020-01-15 DIAGNOSIS — R799 Abnormal finding of blood chemistry, unspecified: Secondary | ICD-10-CM | POA: Diagnosis not present

## 2020-01-15 DIAGNOSIS — F32A Depression, unspecified: Secondary | ICD-10-CM

## 2020-01-15 DIAGNOSIS — G43719 Chronic migraine without aura, intractable, without status migrainosus: Secondary | ICD-10-CM

## 2020-01-15 DIAGNOSIS — F329 Major depressive disorder, single episode, unspecified: Secondary | ICD-10-CM | POA: Diagnosis not present

## 2020-01-15 DIAGNOSIS — E559 Vitamin D deficiency, unspecified: Secondary | ICD-10-CM | POA: Diagnosis not present

## 2020-01-15 DIAGNOSIS — R7989 Other specified abnormal findings of blood chemistry: Secondary | ICD-10-CM | POA: Diagnosis not present

## 2020-01-15 MED ORDER — PROPRANOLOL HCL 20 MG PO TABS
20.0000 mg | ORAL_TABLET | Freq: Two times a day (BID) | ORAL | 6 refills | Status: DC
Start: 2020-01-15 — End: 2020-06-24

## 2020-01-15 MED ORDER — VENLAFAXINE HCL ER 37.5 MG PO CP24: 37.5000 mg | ORAL_CAPSULE | Freq: Every day | ORAL | 11 refills | Status: DC

## 2020-01-15 MED ORDER — TIZANIDINE HCL 4 MG PO TABS: 4.0000 mg | ORAL_TABLET | Freq: Three times a day (TID) | ORAL | 11 refills | Status: DC | PRN

## 2020-01-15 NOTE — Progress Notes (Signed)
   History: 54 year old female, with history of chronic migraine headaches, depression anxiety, presented with a month history of moderate severe holoacranial headaches failed multiple home remedy treatment    Bilateral occipital and trigeminal nerve block; trigger point injection of bilateral cervical and upper trapezius muscles for intractable headache  Bupivacaine 0.5% was injected on the scalp bilaterally at several locations:  -On the occipital area of the head, 3 injections each side, 0.5 cc per injection at the midpoint between the mastoid process and the occipital protuberance. 2 other injections were done one finger breadth from the initial injection, one at a 10 o'clock position and the other at a 2 o'clock position.  -2 injections of 0.5 cc were done in the temporal regions, 2 fingerbreadths above the tragus of the ear, with the second injection one fingerbreadth posteriorly to the first.  -2 injections were done on the brow, 1 in the medial brow and one over the supraorbital nerve notch, with 0.1 cc for each injection  -1 injection each side of 0.5 cc was done anterior to the tragus of the ear for a trigeminal ganglion injection  -0.5 cc was injected into bilateral upper trapezius and bilateral upper cervical paraspinals   The patient tolerated the injections well, no complications of the procedure were noted. Injections were made with a 27-gauge needle.

## 2020-01-15 NOTE — Progress Notes (Signed)
PATIENT: [name] DOB: [birth year]  Chief Complaint  Patient presents with  . Migraine    Work-in for nerve block. Her migraines have been more frequent and severe recently.     HISTORICAL  [name], Is accompanied by her sister returned for Botox injection as migraine prevention  She has past medical history of HIV, hyperlipidemia, migraines, depression, and non-Hodgkin lymphoma in 2005 with chemotherapy and radiation.   She has history of migraines since 19. In the last 10 years, she reports having daily migraines. She is currently taking Topamax and Zomig with relief. She takes approximately 20-25 tablets of her Zomig per month.    She has aura's that look like floating dirt particles, flashing lights, and she sees Christmas tree lights when they are severe, she has nausea, vomiting and sometimes diarrhea when they're severe. She also complains of diplopia, blurry vision, and  photophobia. She also has had a lightning bolt sensation throughout her body.  She describes them as pulsating and throbbing  on a scale of 5-6/10 which she considers mild, when they are severe they can go as high as 10/10. They usually start in the right temporal area, but will also start on the left temporal and are worse.   Any type of smoke or MSG can precipitate a migraine. They generally last 2-9 days. She has been to the emergency department twice in the last year for migraines. She states "I just can't do this anymore I need to do something about her migraines"  She has tried and failed different preventive medications:  atenolol, verapamil, nortriptyline, Inderal, Depakote without benefit. Atenolol and verapamil caused hypotension. Depakote caused severe vomiting. Topamax causing her worsening headaches, will show tried Zonegran, Topamax ER, she denies significant improvement.  For abortive treatment, she has tried Imitrex, Maxalt with suboptimal response, Zomig as needed since to work best,  she  also takes transderm scophalamine patch for nause  I started to give her BOTOX injection since May 2013, every 3 months, she responded very well,   Previous MRI brain was normal in 2013.  Last Botox injection as migraine prevention was March 2017, she responded very well, but in recent few weeks, she noticed increased migraine headaches, this baby had headache left retro-orbital area severe pounding headache with associated light noise sensitivity, has been ongoing for 3 days, failed to respond to Zomig by mouth, Imitrex injection as needed, she has constant dry heaves, difficulty opening her eyes, In addition, today she complains of restless leg symptoms, difficult to stand or lying still for extended period of time, has tried Neurontin-cause nausea, confusion,  Lyrica-cause excessive weight gain, she has never tried dopamine agonist in the past  I reviewed laboratory in April 2017, normal hemoglobin 12 point 9, mild elevated RDW 15.8, decreased MCV  Update April 27 2016  Last Botox injection was in June 2017, she had left knee arthroscopic surgery in August 2017, complaining of significant joints pain, unsteady gait, before Botox she was having moderate to severe headache daily, even Botox now, she used to 12 tablets of Zomig every months because of frequent moderate to severe headache  Update August 10 2016: Last Botox injection was in October 2017, has been very helpful, but over the past 3-4 weeks, she had frequent almost daily headaches, oftentimes wake her up from sleep, Zomig has been very helpful,  Low dose Mirapex 0.25 milligrams 2 tablets every night has been very helpful for her restless leg symptoms, she reported  80% improvement, laboratory reviewed in January 2018, CD4 count 760, normal hemoglobin 12.3, mildly decreased MCV, increased RDW,  Update Nov 09 2016: She suffered bilateral ear infection, strep throat since last visit, had frequent headaches, nausea,  Update February 22 2017: She responded well to previous injection, complains of a lot of stress at home  UPDATE May 31 2017: She complains suboptimal response to Botox injection, had daily headache over last week, also complains of a lot of stress at home, average more than 4 headaches in 1 week, lasting all day, with associated nausea vomiting, is taking Zomig as needed, with partial response  UPDATE September 08 2017: She complains of excessive stress, frequent migraine headaches,  UPDATE December 20 2017: She is accompanied by her sister at today's clinical visit, Ajovy injection has helped her migraine, she rated as 25%, but with the combination of Ajovy and Botox injection, she still has 3 migraine headaches each week, has been dealing with daily headaches over the past 1 week, required multiple dose of Zomig treatment  UPDATE Sept 25 2019: She responded well to previous injection, today she complains of 2 weeks history of severe headaches failed home remedy treatment, multiple dose of Zomig,  She is also getting Ajovy injection every month, which has been helpful,  UPDATE Jun 21 2018: she overall responding well to Botox, Ajovy, also taking amitriptyline 25 mg 2 tablets every night, Zomig as needed for abortive treatment, couple headaches each week, usually responding well to triptan treatment, but this particular headache been ongoing for 2 weeks, tried and failed multiple home medications.  UPDATE September 20 2018: She responded well to previous injection, but at the end of injection cycle,She would have more frequent prolonged headaches, she used Zomig as needed,  UPDATE December 19 2018: She responded very well to previous injection.  UPDATE Apr 17 2019: She is with her sister at today's visit, she reported a lot of stress at home, has increased frequency of migraines, almost daily basis over the past few weeks, she also suffered depression anxiety  UPDATE Aug 07 2019: She continues to have frequent headaches at  home.  Lamotrigine and Elavil were not helpful, she has stopped taking them, Botox injection along with aimovig were very helpful.  UPDATE Nov 06 2019: She return for Botox injection as migraine prevention, previous nerve block in combination with Depakote on October 31, 2019 has helped her headache  UPDATE January 15 2020: This is an earlier than expected visit because of one month history of daily severe headache for 1 month, 8 out of 10, holoacranial, light noise sensitivity, nauseous, frequent vomiting, she use zomig 47m 12 tablets each month, she also tried multiple over-the-counter aspirin, tizanidine 4 mg twice a day  She is on polypharmacy for HTN, HLD, DM, urinary retention, taking flomax, planning on cath twice a week,  See urologist Dr. JIrine Seal   She also complains of depression, has not seen pyschiatrist for long time, previous tried zoloft, Cymbalta, wellbutrin, paxil, did not help, complains of unbearable side effect.  Chronic insomnia, ambien 184m she can only sleep 4 to 5 hours maximum,  I have personally reviewed MRI brain wo in 2013 was normal.   Laboratory evaluation in 2020, CD4 cell was 908, 35% helper cell, within normal limit.  Low normal B12 292, normal CBC, A1c was 7.7   REVIEW OF SYSTEMS: Full 14 system review of systems performed and notable only for as above  ALLERGIES: Allergies  Allergen Reactions  .  Diazepam     Triggers migraines  . Divalproex Sodium Nausea And Vomiting    Causes light-headedness  . Gabapentin Nausea And Vomiting    confusion  . Ondansetron Hcl Nausea And Vomiting    Triggers migraines, "makes me vomit"  . Penicillins     REACTION: lips swollen to point of bleeding, sever vaginal irritation  Reports tolerating augmentin well without any complaints or reaction  . Pineapple   . Propoxyphene N-Acetaminophen Nausea And Vomiting  . Simvastatin     PATIENT CANNOT BE PRESCRIBED THIS STATIN WHILE RECEIVING NORVIR  . Topamax [Topiramate]      Migraines   . Tramadol Hcl Itching    REACTION: migraines and itching--hx of migraines even without medication   . Zocor [Simvastatin - High Dose]     HOME MEDICATIONS: Current Outpatient Medications  Medication Sig Dispense Refill  . albuterol (PROAIR HFA) 108 (90 Base) MCG/ACT inhaler USE 1 TO 2 PUFFS EVERY 4-6 HOURS AS NEEDED FOR SHORTNESS OF BREATH ORWHEEZING 8.5 g 1  . Beclomethasone Dipropionate 80 MCG/ACT AERS Place 2 Inhalers into the nose daily. 8.7 g 10  . blood glucose meter kit and supplies Dispense based on patient and insurance preference. Use up to four times daily as directed. (FOR ICD-9 250.00, 250.01). 1 each 0  . botulinum toxin Type A (BOTOX) 100 units SOLR injection FOR OFFICE ADMINISTRATION. PHYSICIAN TO INJECT UP TO 200 UNITS INTRAMUSCULARLY INTO HEAD, FACE AND NECK AREA EVERY 90 DAYS. 2 each 3  . Calcium Carbonate-Vitamin D (CALCIUM-VITAMIN D) 500-200 MG-UNIT per tablet Take 2 tablets by mouth daily.     . diclofenac Sodium (VOLTAREN) 1 % GEL APPLY TOPICALLY TO AFFECTED AREA 4 GRAMS4 TIMES DAILY 100 g 11  . Erenumab-aooe (AIMOVIG) 140 MG/ML SOAJ Inject 140 mg into the skin every 30 (thirty) days. 1 mL 11  . FARXIGA 10 MG TABS tablet TAKE 1 TABLET BY MOUTH EVERY DAY 90 tablet 3  . glucose blood test strip Use as instructed 100 each 12  . ibuprofen (ADVIL) 800 MG tablet Take 1 tablet (800 mg total) by mouth every 8 (eight) hours as needed. 30 tablet 5  . liraglutide (VICTOZA) 18 MG/3ML SOPN Inject 0.6 mg daily for 1 week, then increase to 1.2 mg daily 3 pen 0  . lisinopril (ZESTRIL) 20 MG tablet TAKE 1 TABLET BY MOUTH EVERY DAY 90 tablet 1  . mometasone-formoterol (DULERA) 100-5 MCG/ACT AERO USE 2 PUFFS BY MOUTH THE FIRST THING IN THE MORNING AND THEN ANOTHER 2 PUFFS ABOUT 12 HOURS LATER 13 g 5  . pantoprazole (PROTONIX) 40 MG tablet TAKE 1 TABLET BY MOUTH 2 TIMES A DAY FORACID REFLUX 60 tablet 4  . Plecanatide (TRULANCE) 3 MG TABS Take 1 tablet by mouth daily. 30  tablet 11  . pravastatin (PRAVACHOL) 40 MG tablet TAKE 1 TABLET BY MOUTH EVERY DAY FOR CHOLESTEROL 90 tablet 1  . Tamsulosin HCl (FLOMAX) 0.4 MG CAPS Take 0.4 mg by mouth daily. 1-2 daily    . tiZANidine (ZANAFLEX) 4 MG tablet TAKE 1 TABLET BY MOUTH 2 TIMES A DAY AS NEEDED FOR MUSCLE SPASMS 60 tablet 11  . zolmitriptan (ZOMIG) 5 MG tablet Take 1 tab at onset of migraine.  May repeat, with 1 tab, in 2 hrs, if needed.  Max dose of 2 tabs/24hrs.  Must last 30 days. #12/30 12 tablet 11  . zolpidem (AMBIEN) 10 MG tablet TAKE 1 TABLET BY MOUTH EACH NIGHT AT BEDTIME AS NEEDED SLEEP 30 tablet  5   No current facility-administered medications for this visit.    PAST MEDICAL HISTORY: Past Medical History:  Diagnosis Date  . Acute gastritis without mention of hemorrhage 01/01/2002  . Anemia 10/2009   macrocytic anemia with baseline MCV 104-106  . Anxiety   . Asthma   . Boil 01/05/2017  . 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 Complex tear of medial meniscus of left knee as current injury 02/25/2016  . Complication of anesthesia    hard to wake up  . COPD (chronic obstructive pulmonary disease) (HCC)    chronic bronchitis  . Derangement of anterior horn of lateral meniscus of left knee 02/25/2016  . Duodenitis without mention of hemorrhage 01/01/2002  . DVT (deep venous thrombosis) (HCC)    history clot lt groin when she had lymphoma-in remision now.  . Elevated triglycerides with high cholesterol 01/05/2017  . Gastroparesis   . GERD (gastroesophageal reflux disease)   . Hiatal hernia 01/01/2002  . HIV infection ([facility])    CD4 = 570 (05/2010), VL undetectation  . Hyperlipidemia   . IBS (irritable bowel syndrome)   . Migraines    on topamax and triptans, frequent (almost daily) attacks   . MRSA (methicillin resistant staph aureus) culture positive 12/20/2010  . Non Hodgkin's lymphoma ([place])    stage II, s/p resection, chemotherapy, radiotherapy, Dr. Beryle Beams is her oncologist.   . PUD (peptic  ulcer disease) 07/1997   per EGD report 07/1997 with history of esophagitis  . Restless leg syndrome 12/24/2015  . Shingles    in lumbar dermatome  . Spondylolisthesis of lumbar region    L5-S1  . TMJ (dislocation of temporomandibular joint)   . Traumatic tear of lateral meniscus of right knee 04/20/2012  . Vaginal yeast infection 09/25/2017    PAST SURGICAL HISTORY: Past Surgical History:  Procedure Laterality Date  . CHOLECYSTECTOMY    . ENDOMETRIAL ABLATION    . KNEE ARTHROSCOPY Right 04/20/12  . KNEE ARTHROSCOPY WITH LATERAL MENISECTOMY Left 02/25/2016   Procedure: LEFT KNEE ARTHROSCOPY WITH MEDIAL AND  LATERAL MENISECTOMIES;  Surgeon: Marchia Bond, MD;  Location: [facility];  Service: Orthopedics;  Laterality: Left;  . KNEE ARTHROSCOPY WITH MEDIAL MENISECTOMY Left 02/25/2016   Procedure: KNEE ARTHROSCOPY WITH MEDIAL MENISECTOMY;  Surgeon: Marchia Bond, MD;  Location: [facility];  Service: Orthopedics;  Laterality: Left;  . TONSILLECTOMY    . TYMPANOPLASTY Left   . TYMPANOSTOMY TUBE PLACEMENT     as child-both  . UPPER GASTROINTESTINAL ENDOSCOPY  2003, 2007   done by Dr. Velora Heckler    FAMILY HISTORY: Family History  Problem Relation Age of Onset  . Hypertension Mother   . Stroke Mother   . Heart disease Mother   . Diabetes Mother   . Colon polyps Mother   . Breast cancer Paternal Grandmother   . Kidney cancer Maternal Uncle   . Heart disease Maternal Uncle   . Diabetes Maternal Uncle   . Diabetes Maternal Grandmother   . Heart disease Maternal Grandmother   . Emphysema Paternal Uncle        never smoker  . Emphysema Paternal Grandfather        never smoker  . Colon cancer Paternal Aunt        with met to lung   . Non-Hodgkin's lymphoma Maternal Uncle   . Heart disease Maternal Grandfather     SOCIAL HISTORY:  Social History   Socioeconomic History  . Marital status: Divorced  Spouse name: Not on file  . Number of children: 0    . Years of education: 64  . Highest education level: Not on file  Occupational History  . Occupation: disabled    Fish farm manager: UNEMPLOYED  Tobacco Use  . Smoking status: Never Smoker  . Smokeless tobacco: Never Used  Vaping Use  . Vaping Use: Never used  Substance and Sexual Activity  . Alcohol use: No    Alcohol/week: 0.0 standard drinks  . Drug use: No  . Sexual activity: Not Currently    Partners: Male    Birth control/protection: Surgical  Other Topics Concern  . Not on file  Social History Narrative      Patient is disabled.   Right handed.   Caffeine - Diet soda., tea   Education - high school   Social Determinants of Health   Financial Resource Strain:   . Difficulty of Paying Living Expenses:   Food Insecurity:   . Worried About Charity fundraiser in the Last Year:   . Arboriculturist in the Last Year:   Transportation Needs:   . Film/video editor (Medical):   Marland Kitchen Lack of Transportation (Non-Medical):   Physical Activity:   . Days of Exercise per Week:   . Minutes of Exercise per Session:   Stress:   . Feeling of Stress :   Social Connections:   . Frequency of Communication with Friends and Family:   . Frequency of Social Gatherings with Friends and Family:   . Attends Religious Services:   . Active Member of Clubs or Organizations:   . Attends Archivist Meetings:   Marland Kitchen Marital Status:   Intimate Partner Violence:   . Fear of Current or Ex-Partner:   . Emotionally Abused:   Marland Kitchen Physically Abused:   . Sexually Abused:      PHYSICAL EXAM   Vitals:   01/15/20 1353  BP: 124/72  Pulse: 88  Weight: 283 lb 8 oz (128.6 kg)  Height: _0  (1.676 m)   Not recorded     PHYSICAL EXAMNIATION:  Gen: NAD, conversant, well nourised, well groomed                     Cardiovascular: Regular rate rhythm, no peripheral edema, warm, nontender. Eyes: Conjunctivae clear without exudates or hemorrhage Neck: Supple, no carotid bruits. Pulmonary:  Clear to auscultation bilaterally   NEUROLOGICAL EXAM:  MENTAL STATUS: Speech/Cognition: Depressed looking middle-aged female, tearful Awake, alert, normal speech, oriented to history taking and casual conversation.  CRANIAL NERVES: CN II: Visual fields are full to confrontation.  Pupils are round equal and briskly reactive to light. CN III, IV, VI: extraocular movement are normal. No ptosis. CN V: Facial sensation is intact to light touch. CN VII: Face is symmetric with normal eye closure and smile. CN VIII: Hearing is normal to casual conversation CN IX, X: Palate elevates symmetrically. Phonation is normal. CN XI: Head turning and shoulder shrug are intact CN XII: Tongue is midline with normal movements and no atrophy.  MOTOR: Muscle bulk and tone are normal. Muscle strength is normal.  REFLEXES: Reflexes are 2  and symmetric at the biceps, triceps, knees and ankles. Plantar responses are flexor.  SENSORY: Intact to light touch, pinprick, positional and vibratory sensation at fingers and toes.  COORDINATION: There is no trunk or limb ataxia.    GAIT/STANCE: Needs push-up to get up from seated position gait is steady with normal steps,  base, arm swing and turning.     ASSESSMENT AND PLAN  SHAMECA LANDEN is a 54 y.o. female    History of HIV, under good control, CD4 was 908 within normal limit, Chronic migraine headaches Depression anxiety, Chronic insomnia History of non-Hodgkin's lymphoma, completed with chemo therapy  Her significant daily migraine headaches happened in the scenario of poorly controlled depression anxiety, chronic insomnia,  Add on Effexor xr 37.5 mg daily as preventive medications,  Continue Zomig as needed, may combine it with tizanidine, Zofran, Aleve, only treat moderate to severe headaches,  The other preventive medication choices are lamotrigine,  She is already on Botox injection, Aimovig 140 mg monthly, which has helped her headache.  Nerve  block today as abortive treatment  Marcial Pacas, M.D. Ph.D.  [facility] [place], [place] [address] Phone: [phone] Fax:      [phone]

## 2020-01-16 ENCOUNTER — Ambulatory Visit: Payer: Medicaid Other | Admitting: Nurse Practitioner

## 2020-01-16 ENCOUNTER — Encounter: Payer: Self-pay | Admitting: Nurse Practitioner

## 2020-01-16 VITALS — BP 122/74 | HR 71 | Ht 66.0 in | Wt 274.5 lb

## 2020-01-16 DIAGNOSIS — K5900 Constipation, unspecified: Secondary | ICD-10-CM

## 2020-01-16 DIAGNOSIS — R112 Nausea with vomiting, unspecified: Secondary | ICD-10-CM | POA: Diagnosis not present

## 2020-01-16 DIAGNOSIS — R140 Abdominal distension (gaseous): Secondary | ICD-10-CM

## 2020-01-16 NOTE — Progress Notes (Signed)
IMPRESSION and PLAN:     [name] is a 54 y.o. female with a PMH signficant for, but not necessarily limited to, migraines, obesity, DM, hyperlipidemia, anxiety, depression, Non-Hodgkins lymphoma, HIV, asthma, DVT, gastroparesis, chronic constipation   # Chronic nausea /  vomiting  / bloating ( years).  --delayed gastric emptying on 2 hours GES several years ago.  --When able to adequately prep for colonoscopy will perform EGD on same day to evaluate N/V.  --A1c 7.7 --If EGD negative then proceed with 4 hours GES --If EGD negative, GES doesn't confirm gastroparesis, consider SIBO testing for evaluation of bloating since diabetes is risk factor.  --Victoza can cause similar upper GI symptoms though given chronicity of symptoms it is probably not contributing factor     # Chronic, severe constipation --Normal TSH yesterday --Partial response to bowel purge we recommended --Some improvement with Trulance + stool softeners. Still with hard, difficult to pass, infrequent BMs.  --Continue Trulance, stool softeners. Add daily Dulcolax. If no improvement within a few days increase Dulcolax to BID.  --Still unikely that she can be adequately prepped for colonoscopy at this point. She will return in a few weeks for reassessment. May need to add back Miralax. Bowel regimen is likely going to ultimately include a combination of multiple agents.      # Obesity --wants to lose weight. Struggles with depression.  --Told probably not candidate for gastric bypass due to chronic vomiting.  --Consider referral to weight loss clinic    HPI:    Primary GI:  Lucio Edward, MD   Chief complaint : follow up on the patient, nausea and vomiting, bloating, hemorrhoids  54 year old female with multiple medical problems on multiple medications who was seen by Dr. Fuller Plan late May for chronic constipation, bloating, chronic nausea / vomiting and GERD.  Her GI symptoms have been present for years. She  had delayed gastric emptying on GES several years ago. Constipation had gradually worsened over the preceding few years.  Linzess nor MiraLAX helped.  Daily Dulcolax minimally effective.  Patient says she has gone 2 months without a BM.  We recommended Trulance, if ineffective she would add Dulcolax 1-2 times a day.  At some point she would need colonoscopy. For chronic nausea and vomiting we recommended EGD on the same day when patient was able to adequately prep for colonoscopy.    INTERVAL HISTORY:  She had only a partial response to bowel purge prior to starting Trulance. Taking trulance plus two stool softeners she is able to have a BM every 3 -4 days now. Stools are hard, she has to strain and perform several manual maneuvers to facilitate passage of stool.  She wasn't aware of our recommendation to add Dulcolax if needed.  She drinks about 50 oz water every day.  Regarding the vomiting, she is nauseated every day, nearly constant. Tends to vomit when constipated or when has a migraine. Was on a scopolamine patch but it interfered with urination, she has urinary retention and may need to start self- catherizing. She uses phenergan suppositories which helps the nausea. She continues to suffer with bloating despite some improvement in constipation. Patient says she is overweight but know difference between that and abdominal bloating.  Wants to lose weight. Struggles with depression after losing 20 family / friends over the last year or so.     Data Reviewed:   April 2021 CBC unremarkable   PREVIOUS ENDOSCOPIC EVALUATIONS / GI studies:  July 2011. EGD and colonoscopy by Dr. Ardis Hughs for constipation, abdominal pain, nausea and vomiting -- both were normal except for internal and external hemorrhoids.    2007 a  2-hour duration gastric emptying scan revealed borderline delayed emptying with 29% retained at 2 hours.   Review of systems:     No chest pain, no SOB, no fevers, no urinary sx    Past Medical History:  Diagnosis Date  . Acute gastritis without mention of hemorrhage 01/01/2002  . Anemia 10/2009   macrocytic anemia with baseline MCV 104-106  . Anxiety   . Asthma   . Boil 01/05/2017  . 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 Complex tear of medial meniscus of left knee as current injury 02/25/2016  . Complication of anesthesia    hard to wake up  . COPD (chronic obstructive pulmonary disease) (HCC)    chronic bronchitis  . Derangement of anterior horn of lateral meniscus of left knee 02/25/2016  . Duodenitis without mention of hemorrhage 01/01/2002  . DVT (deep venous thrombosis) (HCC)    history clot lt groin when she had lymphoma-in remision now.  . Elevated triglycerides with high cholesterol 01/05/2017  . Gastroparesis   . GERD (gastroesophageal reflux disease)   . Hiatal hernia 01/01/2002  . HIV infection ([place])    CD4 = 570 (05/2010), VL undetectation  . Hyperlipidemia   . IBS (irritable bowel syndrome)   . Migraines    on topamax and triptans, frequent (almost daily) attacks   . MRSA (methicillin resistant staph aureus) culture positive 12/20/2010  . Non Hodgkin's lymphoma ([place])    stage II, s/p resection, chemotherapy, radiotherapy, Dr. Beryle Beams is her oncologist.   . PUD (peptic ulcer disease) 07/1997   per EGD report 07/1997 with history of esophagitis  . Restless leg syndrome 12/24/2015  . Shingles    in lumbar dermatome  . Spondylolisthesis of lumbar region    L5-S1  . TMJ (dislocation of temporomandibular joint)   . Traumatic tear of lateral meniscus of right knee 04/20/2012  . Vaginal yeast infection 09/25/2017    Patient's surgical history, family medical history, social history, medications and allergies were all reviewed in Epic   Creatinine clearance cannot be calculated (Patient's most recent lab result is older than the maximum 21 days allowed.)  Current Outpatient Medications  Medication Sig Dispense Refill  . albuterol (PROAIR  HFA) 108 (90 Base) MCG/ACT inhaler USE 1 TO 2 PUFFS EVERY 4-6 HOURS AS NEEDED FOR SHORTNESS OF BREATH ORWHEEZING 8.5 g 1  . Beclomethasone Dipropionate 80 MCG/ACT AERS Place 2 Inhalers into the nose daily. 8.7 g 10  . blood glucose meter kit and supplies Dispense based on patient and insurance preference. Use up to four times daily as directed. (FOR ICD-9 250.00, 250.01). 1 each 0  . botulinum toxin Type A (BOTOX) 100 units SOLR injection FOR OFFICE ADMINISTRATION. PHYSICIAN TO INJECT UP TO 200 UNITS INTRAMUSCULARLY INTO HEAD, FACE AND NECK AREA EVERY 90 DAYS. 2 each 3  . Calcium Carbonate-Vitamin D (CALCIUM-VITAMIN D) 500-200 MG-UNIT per tablet Take 2 tablets by mouth daily.     . diclofenac Sodium (VOLTAREN) 1 % GEL APPLY TOPICALLY TO AFFECTED AREA 4 GRAMS4 TIMES DAILY 100 g 11  . Erenumab-aooe (AIMOVIG) 140 MG/ML SOAJ Inject 140 mg into the skin every 30 (thirty) days. 1 mL 11  . FARXIGA 10 MG TABS tablet TAKE 1 TABLET BY MOUTH EVERY DAY 90 tablet 3  . glucose blood test strip Use as instructed  100 each 12  . ibuprofen (ADVIL) 800 MG tablet Take 1 tablet (800 mg total) by mouth every 8 (eight) hours as needed. 30 tablet 5  . liraglutide (VICTOZA) 18 MG/3ML SOPN Inject 0.6 mg daily for 1 week, then increase to 1.2 mg daily 3 pen 0  . lisinopril (ZESTRIL) 20 MG tablet TAKE 1 TABLET BY MOUTH EVERY DAY 90 tablet 1  . mometasone-formoterol (DULERA) 100-5 MCG/ACT AERO USE 2 PUFFS BY MOUTH THE FIRST THING IN THE MORNING AND THEN ANOTHER 2 PUFFS ABOUT 12 HOURS LATER 13 g 5  . pantoprazole (PROTONIX) 40 MG tablet TAKE 1 TABLET BY MOUTH 2 TIMES A DAY FORACID REFLUX 60 tablet 4  . Plecanatide (TRULANCE) 3 MG TABS Take 1 tablet by mouth daily. 30 tablet 11  . pravastatin (PRAVACHOL) 40 MG tablet TAKE 1 TABLET BY MOUTH EVERY DAY FOR CHOLESTEROL 90 tablet 1  . propranolol (INDERAL) 20 MG tablet Take 1 tablet (20 mg total) by mouth 2 (two) times daily. 60 tablet 6  . Tamsulosin HCl (FLOMAX) 0.4 MG CAPS Take 0.4  mg by mouth daily. 1-2 daily    . tiZANidine (ZANAFLEX) 4 MG tablet Take 1 tablet (4 mg total) by mouth every 8 (eight) hours as needed for muscle spasms. 90 tablet 11  . venlafaxine XR (EFFEXOR XR) 37.5 MG 24 hr capsule Take 1 capsule (37.5 mg total) by mouth daily with breakfast. 30 capsule 11  . zolmitriptan (ZOMIG) 5 MG tablet Take 1 tab at onset of migraine.  May repeat, with 1 tab, in 2 hrs, if needed.  Max dose of 2 tabs/24hrs.  Must last 30 days. #12/30 12 tablet 11  . zolpidem (AMBIEN) 10 MG tablet TAKE 1 TABLET BY MOUTH EACH NIGHT AT BEDTIME AS NEEDED SLEEP 30 tablet 5   No current facility-administered medications for this visit.    Filed Weights   01/16/20 1052  Weight: 274 lb 8 oz (124.5 kg)    Physical Exam:     BP 122/74   Pulse 71   Ht 5' 6"  (1.676 m)   Wt 274 lb 8 oz (124.5 kg)   BMI 44.31 kg/m   GENERAL:  Pleasant female in NAD PSYCH: : Cooperative, normal affect CARDIAC:  RRR PULM: Normal respiratory effort, lungs CTA bilaterally, no wheezing ABDOMEN:  Nondistended, soft, nontender. No obvious masses,  normal bowel sounds SKIN:  turgor, no lesions seen Musculoskeletal:  Normal muscle tone, normal strength NEURO: Alert and oriented x 3, no focal neurologic deficits  I spent 30 minutes total reviewing records, obtaining history, performing exam, counseling patient and documenting visit / findings.   Tye Savoy , NP 01/16/2020, 10:26 AM

## 2020-01-16 NOTE — Patient Instructions (Addendum)
If you are age 53 or older, your body mass index should be between 23-30. Your Body mass index is 44.31 kg/m. If this is out of the aforementioned range listed, please consider follow up with your Primary Care Provider.  If you are age 78 or younger, your body mass index should be between 19-25. Your Body mass index is 44.31 kg/m. If this is out of the aformentioned range listed, please consider follow up with your Primary Care Provider.   Continue Trulance Start 1 Dulcolax daily. If you are not experiencing any relief then in crease to taking 1 twice a day   You have been schedule to follow up with Dr. Fuller Plan on February 24, 2020 at 3:40 pm

## 2020-01-20 NOTE — Progress Notes (Signed)
Reviewed and agree with management plan.  Davinder Haff T. Maryann Mccall, MD FACG [facility]  

## 2020-01-21 ENCOUNTER — Telehealth: Payer: Self-pay | Admitting: *Deleted

## 2020-01-21 LAB — 25-HYDROXY VITAMIN D LCMS D2+D3
25-Hydroxy, Vitamin D-2: 3 ng/mL
25-Hydroxy, Vitamin D-3: 28 ng/mL
25-Hydroxy, Vitamin D: 31 ng/mL

## 2020-01-21 LAB — TSH: TSH: 2.43 u[IU]/mL (ref 0.450–4.500)

## 2020-01-21 NOTE — Telephone Encounter (Signed)
-----   Message from Marcial Pacas, MD sent at 01/21/2020  1:11 PM EDT ----- Please call patient for normal laboratory result

## 2020-01-21 NOTE — Telephone Encounter (Signed)
Left message letting her know the lab work was normal. Provided our number to call back with any questions.

## 2020-01-27 ENCOUNTER — Telehealth: Payer: Self-pay | Admitting: Neurology

## 2020-01-27 MED ORDER — BOTOX 100 UNITS IJ SOLR: INTRAMUSCULAR | 3 refills | Status: DC

## 2020-01-27 NOTE — Addendum Note (Signed)
Addended by: Desmond Lope on: 01/27/2020 04:38 PM   Modules accepted: Orders

## 2020-01-27 NOTE — Telephone Encounter (Signed)
Patient has Botox appointment on 8/11. I called [facility] [phone]) and spoke with [place] to see about scheduling patient's Botox delivery. She states that [facility] has not yet reached out to the patient to get consent. I called patient while on the phone with [place]. I was not able to reach her, so I LVM with the number for [facility] and asked patient to call them to give consent for shipment.   Patient has PA on file with Medicaid. PA #[id number] (10/29/19- 10/23/20).

## 2020-01-27 NOTE — Telephone Encounter (Signed)
I received a fax from [place] stating that patient's insurance now requires her to use [facility].   Sharyn Lull, can you please send patient's Botox prescription to [facility]?

## 2020-01-28 NOTE — Telephone Encounter (Signed)
[name] with [facility] called to schedule delivery of patient's Botox. Delivery scheduled for 8/3.

## 2020-02-04 NOTE — Telephone Encounter (Signed)
Botox is here  

## 2020-02-06 ENCOUNTER — Telehealth: Payer: Self-pay | Admitting: *Deleted

## 2020-02-06 ENCOUNTER — Other Ambulatory Visit: Payer: Self-pay | Admitting: Obstetrics

## 2020-02-06 DIAGNOSIS — B3731 Acute candidiasis of vulva and vagina: Secondary | ICD-10-CM

## 2020-02-06 NOTE — Telephone Encounter (Signed)
PA for Aimovig 140mg  started on covermymeds (key: [id number]). Pt has a Medicaid plan though [employer] (YY#[id number] S). [id number]. Approved through 02/05/2021.   PA for zolmitriptan started on covermymeds (key: [id number]). Pt has a Medicaid plan though [employer] (MI#[id number] S). [id number]. Approved through 02/05/2021.

## 2020-02-12 ENCOUNTER — Other Ambulatory Visit: Payer: Self-pay

## 2020-02-12 ENCOUNTER — Ambulatory Visit: Payer: Medicaid Other | Admitting: Neurology

## 2020-02-12 ENCOUNTER — Encounter: Payer: Self-pay | Admitting: Neurology

## 2020-02-12 VITALS — BP 118/62 | HR 92 | Ht 66.0 in | Wt 285.3 lb

## 2020-02-12 DIAGNOSIS — G43719 Chronic migraine without aura, intractable, without status migrainosus: Secondary | ICD-10-CM

## 2020-02-12 MED ORDER — VENLAFAXINE HCL ER 75 MG PO CP24: 75.0000 mg | ORAL_CAPSULE | Freq: Every day | ORAL | 4 refills | Status: DC

## 2020-02-12 NOTE — Progress Notes (Signed)
Botox 100 units vials x 2  PNT:[id number] Exp: 05/2022  Normal Saline 4CC  Lot [id number] Exp 08/2020 [facility]

## 2020-02-12 NOTE — Telephone Encounter (Signed)
resolved 

## 2020-02-12 NOTE — Progress Notes (Signed)
PATIENT: [name] DOB: Dec 11, 1965  Chief Complaint  Patient presents with  . Botulinum Toxin Injection    New rm 100 unit vials x2     HISTORICAL  [name], Is accompanied by her sister returned for Botox injection as migraine prevention  She has past medical history of HIV, hyperlipidemia, migraines, depression, and non-Hodgkin lymphoma in 2005 with chemotherapy and radiation.   She has history of migraines since 19. In the last 10 years, she reports having daily migraines. She is currently taking Topamax and Zomig with relief. She takes approximately 20-25 tablets of her Zomig per month.    She has aura's that look like floating dirt particles, flashing lights, and she sees Christmas tree lights when they are severe, she has nausea, vomiting and sometimes diarrhea when they're severe. She also complains of diplopia, blurry vision, and  photophobia. She also has had a lightning bolt sensation throughout her body.  She describes them as pulsating and throbbing  on a scale of 5-6/10 which she considers mild, when they are severe they can go as high as 10/10. They usually start in the right temporal area, but will also start on the left temporal and are worse.   Any type of smoke or MSG can precipitate a migraine. They generally last 2-9 days. She has been to the emergency department twice in the last year for migraines. She states "I just can't do this anymore I need to do something about her migraines"  She has tried and failed different preventive medications:  atenolol, verapamil, nortriptyline, Inderal, Depakote without benefit. Atenolol and verapamil caused hypotension. Depakote caused severe vomiting. Topamax causing her worsening headaches, will show tried Zonegran, Topamax ER, she denies significant improvement.  For abortive treatment, she has tried Imitrex, Maxalt with suboptimal response, Zomig as needed since to work best,  she also takes transderm scophalamine patch for  nause  I started to give her BOTOX injection since May 2013, every 3 months, she responded very well,   Previous MRI brain was normal in 2013.  Last Botox injection as migraine prevention was March 2017, she responded very well, but in recent few weeks, she noticed increased migraine headaches, this baby had headache left retro-orbital area severe pounding headache with associated light noise sensitivity, has been ongoing for 3 days, failed to respond to Zomig by mouth, Imitrex injection as needed, she has constant dry heaves, difficulty opening her eyes, In addition, today she complains of restless leg symptoms, difficult to stand or lying still for extended period of time, has tried Neurontin-cause nausea, confusion,  Lyrica-cause excessive weight gain, she has never tried dopamine agonist in the past  I reviewed laboratory in April 2017, normal hemoglobin 12 point 9, mild elevated RDW 15.8, decreased MCV  Update April 27 2016  Last Botox injection was in June 2017, she had left knee arthroscopic surgery in August 2017, complaining of significant joints pain, unsteady gait, before Botox she was having moderate to severe headache daily, even Botox now, she used to 12 tablets of Zomig every months because of frequent moderate to severe headache  Update August 10 2016: Last Botox injection was in October 2017, has been very helpful, but over the past 3-4 weeks, she had frequent almost daily headaches, oftentimes wake her up from sleep, Zomig has been very helpful,  Low dose Mirapex 0.25 milligrams 2 tablets every night has been very helpful for her restless leg symptoms, she reported 80% improvement, laboratory reviewed in  January 2018, CD4 count 760, normal hemoglobin 12.3, mildly decreased MCV, increased RDW,  Update Nov 09 2016: She suffered bilateral ear infection, strep throat since last visit, had frequent headaches, nausea,  Update February 22 2017: She responded well to previous  injection, complains of a lot of stress at home  UPDATE May 31 2017: She complains suboptimal response to Botox injection, had daily headache over last week, also complains of a lot of stress at home, average more than 4 headaches in 1 week, lasting all day, with associated nausea vomiting, is taking Zomig as needed, with partial response  UPDATE September 08 2017: She complains of excessive stress, frequent migraine headaches,  UPDATE December 20 2017: She is accompanied by her sister at today's clinical visit, Ajovy injection has helped her migraine, she rated as 25%, but with the combination of Ajovy and Botox injection, she still has 3 migraine headaches each week, has been dealing with daily headaches over the past 1 week, required multiple dose of Zomig treatment  UPDATE Sept 25 2019: She responded well to previous injection, today she complains of 2 weeks history of severe headaches failed home remedy treatment, multiple dose of Zomig,  She is also getting Ajovy injection every month, which has been helpful,  UPDATE Jun 21 2018: she overall responding well to Botox, Ajovy, also taking amitriptyline 25 mg 2 tablets every night, Zomig as needed for abortive treatment, couple headaches each week, usually responding well to triptan treatment, but this particular headache been ongoing for 2 weeks, tried and failed multiple home medications.  UPDATE September 20 2018: She responded well to previous injection, but at the end of injection cycle,She would have more frequent prolonged headaches, she used Zomig as needed,  UPDATE December 19 2018: She responded very well to previous injection.  UPDATE Apr 17 2019: She is with her sister at today's visit, she reported a lot of stress at home, has increased frequency of migraines, almost daily basis over the past few weeks, she also suffered depression anxiety  UPDATE Aug 07 2019: She continues to have frequent headaches at home.  Lamotrigine and Elavil were  not helpful, she has stopped taking them, Botox injection along with aimovig were very helpful.  UPDATE Nov 06 2019: She return for Botox injection as migraine prevention, previous nerve block in combination with Depakote on October 31, 2019 has helped her headache  UPDATE January 15 2020: This is an earlier than expected visit because of one month history of daily severe headache for 1 month, 8 out of 10, holoacranial, light noise sensitivity, nauseous, frequent vomiting, she use zomig 20m 12 tablets each month, she also tried multiple over-the-counter aspirin, tizanidine 4 mg twice a day  She is on polypharmacy for HTN, HLD, DM, urinary retention, taking flomax, planning on cath twice a week,  See urologist Dr. JIrine Seal   She also complains of depression, has not seen pyschiatrist for long time, previous tried zoloft, Cymbalta, wellbutrin, paxil, did not help, complains of unbearable side effect.  Chronic insomnia, ambien 138m she can only sleep 4 to 5 hours maximum,  I have personally reviewed MRI brain wo in 2013 was normal.   Laboratory evaluation in 2020, CD4 cell was 908, 35% helper cell, within normal limit.  Low normal B12 292, normal CBC, A1c was 7.7 Update office February 12 2020 Her headache has improved with adding Effexor XR 37.5 mg daily, tolerating the medicine well, return for Botox injection as migraine prevention today, also Aimovig  140 mg monthly, she complains of diffuse body achy pain  REVIEW OF SYSTEMS: Full 14 system review of systems performed and notable only for as above  ALLERGIES: Allergies  Allergen Reactions  . Diazepam     Triggers migraines  . Divalproex Sodium Nausea And Vomiting    Causes light-headedness  . Gabapentin Nausea And Vomiting    confusion  . Ondansetron Hcl Nausea And Vomiting    Triggers migraines, "makes me vomit"  . Penicillins     REACTION: lips swollen to point of bleeding, sever vaginal irritation  Reports tolerating augmentin well  without any complaints or reaction  . Pineapple   . Propoxyphene N-Acetaminophen Nausea And Vomiting  . Simvastatin     PATIENT CANNOT BE PRESCRIBED THIS STATIN WHILE RECEIVING NORVIR  . Topamax [Topiramate]     Migraines   . Tramadol Hcl Itching    REACTION: migraines and itching--hx of migraines even without medication   . Zocor [Simvastatin - High Dose]     HOME MEDICATIONS: Current Outpatient Medications  Medication Sig Dispense Refill  . albuterol (PROAIR HFA) 108 (90 Base) MCG/ACT inhaler USE 1 TO 2 PUFFS EVERY 4-6 HOURS AS NEEDED FOR SHORTNESS OF BREATH ORWHEEZING 8.5 g 1  . Beclomethasone Dipropionate 80 MCG/ACT AERS Place 2 Inhalers into the nose daily. 8.7 g 10  . blood glucose meter kit and supplies Dispense based on patient and insurance preference. Use up to four times daily as directed. (FOR ICD-9 250.00, 250.01). 1 each 0  . botulinum toxin Type A (BOTOX) 100 units SOLR injection FOR OFFICE ADMINISTRATION. PHYSICIAN TO INJECT UP TO 200 UNITS INTRAMUSCULARLY INTO HEAD, FACE AND NECK AREA EVERY 90 DAYS. 2 each 3  . Calcium Carbonate-Vitamin D (CALCIUM-VITAMIN D) 500-200 MG-UNIT per tablet Take 2 tablets by mouth daily.     . Darunavir-Cobicisctat-Emtricitabine-Tenofovir Alafenamide (SYMTUZA) 800-150-200-10 MG TABS Take 1 tablet by mouth daily with breakfast.    . diclofenac Sodium (VOLTAREN) 1 % GEL APPLY TOPICALLY TO AFFECTED AREA 4 GRAMS4 TIMES DAILY 100 g 11  . Erenumab-aooe (AIMOVIG) 140 MG/ML SOAJ Inject 140 mg into the skin every 30 (thirty) days. 1 mL 11  . FARXIGA 10 MG TABS tablet TAKE 1 TABLET BY MOUTH EVERY DAY 90 tablet 3  . glucose blood test strip Use as instructed 100 each 12  . ibuprofen (ADVIL) 800 MG tablet Take 1 tablet (800 mg total) by mouth every 8 (eight) hours as needed. 30 tablet 5  . liraglutide (VICTOZA) 18 MG/3ML SOPN Inject 0.6 mg daily for 1 week, then increase to 1.2 mg daily 3 pen 0  . lisinopril (ZESTRIL) 20 MG tablet TAKE 1 TABLET BY MOUTH  EVERY DAY 90 tablet 1  . metroNIDAZOLE (METROGEL) 0.75 % gel Apply 1 application topically 2 (two) times daily.    . mometasone-formoterol (DULERA) 100-5 MCG/ACT AERO USE 2 PUFFS BY MOUTH THE FIRST THING IN THE MORNING AND THEN ANOTHER 2 PUFFS ABOUT 12 HOURS LATER 13 g 5  . pantoprazole (PROTONIX) 40 MG tablet TAKE 1 TABLET BY MOUTH 2 TIMES A DAY FORACID REFLUX 60 tablet 4  . Plecanatide (TRULANCE) 3 MG TABS Take 1 tablet by mouth daily. 30 tablet 11  . pravastatin (PRAVACHOL) 40 MG tablet TAKE 1 TABLET BY MOUTH EVERY DAY FOR CHOLESTEROL 90 tablet 1  . promethazine (PHENERGAN) 25 MG suppository Place 25 mg rectally every 6 (six) hours as needed for nausea or vomiting.    . propranolol (INDERAL) 20 MG tablet Take 1 tablet (  20 mg total) by mouth 2 (two) times daily. 60 tablet 6  . Tamsulosin HCl (FLOMAX) 0.4 MG CAPS Take 0.4 mg by mouth daily. 1-2 daily    . terconazole (TERAZOL 7) 0.4 % vaginal cream PLACE 1 APPLICATORFUL VAGINALLY AT BEDTIME 45 g 3  . tiZANidine (ZANAFLEX) 4 MG tablet Take 1 tablet (4 mg total) by mouth every 8 (eight) hours as needed for muscle spasms. 90 tablet 11  . venlafaxine XR (EFFEXOR XR) 37.5 MG 24 hr capsule Take 1 capsule (37.5 mg total) by mouth daily with breakfast. 30 capsule 11  . zolmitriptan (ZOMIG) 5 MG tablet Take 1 tab at onset of migraine.  May repeat, with 1 tab, in 2 hrs, if needed.  Max dose of 2 tabs/24hrs.  Must last 30 days. #12/30 12 tablet 11  . zolpidem (AMBIEN) 10 MG tablet TAKE 1 TABLET BY MOUTH EACH NIGHT AT BEDTIME AS NEEDED SLEEP 30 tablet 5   No current facility-administered medications for this visit.    PAST MEDICAL HISTORY: Past Medical History:  Diagnosis Date  . Acute gastritis without mention of hemorrhage 01/01/2002  . Anemia 10/2009   macrocytic anemia with baseline MCV 104-106  . Anxiety   . Asthma   . Boil 01/05/2017  . 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 Complex tear of medial meniscus of left knee as current injury  02/25/2016  . Complication of anesthesia    hard to wake up  . COPD (chronic obstructive pulmonary disease) (HCC)    chronic bronchitis  . Derangement of anterior horn of lateral meniscus of left knee 02/25/2016  . Duodenitis without mention of hemorrhage 01/01/2002  . DVT (deep venous thrombosis) (HCC)    history clot lt groin when she had lymphoma-in remision now.  . Elevated triglycerides with high cholesterol 01/05/2017  . Gastroparesis   . GERD (gastroesophageal reflux disease)   . Hiatal hernia 01/01/2002  . HIV infection ([place])    CD4 = 570 (05/2010), VL undetectation  . Hyperlipidemia   . IBS (irritable bowel syndrome)   . Migraines    on topamax and triptans, frequent (almost daily) attacks   . MRSA (methicillin resistant staph aureus) culture positive 12/20/2010  . Non Hodgkin's lymphoma ([place])    stage II, s/p resection, chemotherapy, radiotherapy, Dr. Beryle Beams is her oncologist.   . PUD (peptic ulcer disease) 07/1997   per EGD report 07/1997 with history of esophagitis  . Restless leg syndrome 12/24/2015  . Shingles    in lumbar dermatome  . Spondylolisthesis of lumbar region    L5-S1  . TMJ (dislocation of temporomandibular joint)   . Traumatic tear of lateral meniscus of right knee 04/20/2012  . Vaginal yeast infection 09/25/2017    PAST SURGICAL HISTORY: Past Surgical History:  Procedure Laterality Date  . CHOLECYSTECTOMY    . ENDOMETRIAL ABLATION    . KNEE ARTHROSCOPY Right 04/20/12  . KNEE ARTHROSCOPY WITH LATERAL MENISECTOMY Left 02/25/2016   Procedure: LEFT KNEE ARTHROSCOPY WITH MEDIAL AND  LATERAL MENISECTOMIES;  Surgeon: Marchia Bond, MD;  Location: [facility];  Service: Orthopedics;  Laterality: Left;  . KNEE ARTHROSCOPY WITH MEDIAL MENISECTOMY Left 02/25/2016   Procedure: KNEE ARTHROSCOPY WITH MEDIAL MENISECTOMY;  Surgeon: Marchia Bond, MD;  Location: [facility];  Service: Orthopedics;  Laterality: Left;  . TONSILLECTOMY    .  TYMPANOPLASTY Left   . TYMPANOSTOMY TUBE PLACEMENT     as child-both  . UPPER GASTROINTESTINAL ENDOSCOPY  2003, 2007  done by Dr. Velora Heckler    FAMILY HISTORY: Family History  Problem Relation Age of Onset  . Hypertension Mother   . Stroke Mother   . Heart disease Mother   . Diabetes Mother   . Colon polyps Mother   . Breast cancer Paternal Grandmother   . Kidney cancer Maternal Uncle   . Heart disease Maternal Uncle   . Diabetes Maternal Uncle   . Diabetes Maternal Grandmother   . Heart disease Maternal Grandmother   . Emphysema Paternal Uncle        never smoker  . Emphysema Paternal Grandfather        never smoker  . Colon cancer Paternal Aunt        with met to lung   . Non-Hodgkin's lymphoma Maternal Uncle   . Heart disease Maternal Grandfather     SOCIAL HISTORY:  Social History   Socioeconomic History  . Marital status: Divorced    Spouse name: Not on file  . Number of children: 0  . Years of education: 45  . Highest education level: Not on file  Occupational History  . Occupation: disabled    Fish farm manager: UNEMPLOYED  Tobacco Use  . Smoking status: Never Smoker  . Smokeless tobacco: Never Used  Vaping Use  . Vaping Use: Never used  Substance and Sexual Activity  . Alcohol use: No    Alcohol/week: 0.0 standard drinks  . Drug use: No  . Sexual activity: Not Currently    Partners: Male    Birth control/protection: Surgical  Other Topics Concern  . Not on file  Social History Narrative      Patient is disabled.   Right handed.   Caffeine - Diet soda., tea   Education - high school   Social Determinants of Health   Financial Resource Strain:   . Difficulty of Paying Living Expenses:   Food Insecurity:   . Worried About Charity fundraiser in the Last Year:   . Arboriculturist in the Last Year:   Transportation Needs:   . Film/video editor (Medical):   Marland Kitchen Lack of Transportation (Non-Medical):   Physical Activity:   . Days of Exercise per  Week:   . Minutes of Exercise per Session:   Stress:   . Feeling of Stress :   Social Connections:   . Frequency of Communication with Friends and Family:   . Frequency of Social Gatherings with Friends and Family:   . Attends Religious Services:   . Active Member of Clubs or Organizations:   . Attends Archivist Meetings:   Marland Kitchen Marital Status:   Intimate Partner Violence:   . Fear of Current or Ex-Partner:   . Emotionally Abused:   Marland Kitchen Physically Abused:   . Sexually Abused:      PHYSICAL EXAM   Vitals:   02/12/20 1552  BP: 118/62  Pulse: 92  SpO2: 92%  Weight: 285 lb 5 oz (129.4 kg)  Height: 5' 6"  (1.676 m)   Not recorded    ASSESSMENT AND PLAN  [name] is a 54 y.o. female    History of HIV, under good control, CD4 was 908 within normal limit, Chronic migraine headaches Depression anxiety, Chronic insomnia History of non-Hodgkin's lymphoma, completed with chemo therapy  Her significant daily migraine headaches happened in the scenario of poorly controlled depression anxiety, chronic insomnia,  Zomig as needed, may combine it with tizanidine, Zofran, Aleve, only treat moderate to severe headaches,  For migraine prevention she is on Botox injection, Aimovig 140 mg monthly, increase to higher dose of Effexor Exar 75 mg daily  Botox injection for chronic migraine prevention, injection was performed according to [place] protocol,  5 units of Botox was injected into each side, for 31 injection sites, total of 155 units  Bilateral frontalis 4 injection sites Bilateral corrugate 2 injection sites Procerus 1 injection sites. Bilateral temporalis 8 injection sites Bilateral occipitalis 6 injection sites Bilateral cervical paraspinals 4 injection sites Bilateral upper trapezius 6 injection sites  Extra 45 unites were injected into bilateral cervical paraspinal and upper trapezius Marcial Pacas, M.D. Ph.D.  [facility] [place], [place] [address] Phone: [phone] Fax:      [phone]

## 2020-02-24 ENCOUNTER — Ambulatory Visit: Payer: Medicaid Other | Admitting: Gastroenterology

## 2020-03-02 DIAGNOSIS — E1165 Type 2 diabetes mellitus with hyperglycemia: Secondary | ICD-10-CM | POA: Diagnosis not present

## 2020-03-02 DIAGNOSIS — E7800 Pure hypercholesterolemia, unspecified: Secondary | ICD-10-CM | POA: Diagnosis not present

## 2020-03-10 ENCOUNTER — Other Ambulatory Visit: Payer: Self-pay | Admitting: *Deleted

## 2020-03-10 DIAGNOSIS — J45909 Unspecified asthma, uncomplicated: Secondary | ICD-10-CM

## 2020-03-10 MED ORDER — DULERA 100-5 MCG/ACT IN AERO: INHALATION_SPRAY | RESPIRATORY_TRACT | 5 refills | Status: AC

## 2020-03-18 ENCOUNTER — Other Ambulatory Visit: Payer: Self-pay | Admitting: Infectious Disease

## 2020-03-18 ENCOUNTER — Telehealth: Payer: Self-pay

## 2020-03-18 NOTE — Telephone Encounter (Signed)
Called patient to get an appointment scheduled, left a voicemail to call and get that scheduled

## 2020-04-03 ENCOUNTER — Telehealth: Payer: Self-pay

## 2020-04-03 ENCOUNTER — Other Ambulatory Visit: Payer: Self-pay | Admitting: Obstetrics

## 2020-04-03 ENCOUNTER — Other Ambulatory Visit: Payer: Self-pay

## 2020-04-03 ENCOUNTER — Other Ambulatory Visit: Payer: Self-pay | Admitting: Infectious Disease

## 2020-04-03 DIAGNOSIS — G4700 Insomnia, unspecified: Secondary | ICD-10-CM

## 2020-04-03 DIAGNOSIS — N750 Cyst of Bartholin's gland: Secondary | ICD-10-CM

## 2020-04-03 NOTE — Telephone Encounter (Signed)
Attempted to call patient to schedule overdue appointment with Dr. Tommy Medal. Patient is not able to take call at this time, left voicemail requesting a call back for appointment. Refills pending on patient appointment.  [place]

## 2020-04-06 MED ORDER — ZOLPIDEM TARTRATE 10 MG PO TABS: ORAL_TABLET | ORAL | 5 refills | Status: DC

## 2020-04-13 ENCOUNTER — Other Ambulatory Visit: Payer: Self-pay | Admitting: Neurology

## 2020-04-13 ENCOUNTER — Other Ambulatory Visit: Payer: Self-pay | Admitting: Infectious Disease

## 2020-04-27 ENCOUNTER — Telehealth: Payer: Self-pay | Admitting: Neurology

## 2020-04-27 NOTE — Telephone Encounter (Signed)
Patient has a Botox injection on 11/24. I received a fax from [facility] stating that they have attempted to reach out to patient for consent but have not been able to reach her. I called the patient to remind her to give consent. She states that she now has Medicaid through [facility] and does not think she can use [facility]. I advised the patient that I believe she can use [facility], but told her I would call her insurance and [facility] to verify.

## 2020-04-28 NOTE — Telephone Encounter (Signed)
I called [facility] and spoke to [facility] to see if Botox could be delivered. She states the order is ready to schedule. Steph scheduled Botox delivery for 10/28.   I called [employer] and spoke with [place] to see if codes [phone] and [phone] require PA. [place] states no PA is required. She confirmed that [facility] specialty [facility]. Reference #[id number].

## 2020-04-30 NOTE — Telephone Encounter (Signed)
Received (2) 100U vials of Botox from [facility] for patient's 11/24 appointment. I called the patient to let her know.

## 2020-05-01 ENCOUNTER — Other Ambulatory Visit: Payer: Self-pay | Admitting: Obstetrics

## 2020-05-01 ENCOUNTER — Other Ambulatory Visit: Payer: Self-pay | Admitting: Infectious Disease

## 2020-05-01 DIAGNOSIS — B373 Candidiasis of vulva and vagina: Secondary | ICD-10-CM

## 2020-05-01 DIAGNOSIS — B3731 Acute candidiasis of vulva and vagina: Secondary | ICD-10-CM

## 2020-05-04 ENCOUNTER — Other Ambulatory Visit: Payer: Self-pay | Admitting: Obstetrics

## 2020-05-04 ENCOUNTER — Telehealth: Payer: Self-pay

## 2020-05-04 DIAGNOSIS — N750 Cyst of Bartholin's gland: Secondary | ICD-10-CM

## 2020-05-04 MED ORDER — DOXYCYCLINE HYCLATE 100 MG PO CAPS: 100.0000 mg | ORAL_CAPSULE | Freq: Two times a day (BID) | ORAL | 0 refills | Status: DC

## 2020-05-04 NOTE — Telephone Encounter (Signed)
Return call to pt to make aware Rx has been sent for return bartholin cyst Pt unable to come in due to covid exposure per pt does not need I&D Pt has Rx already for diflucan with 3 RF's  Pt not ava LVM.

## 2020-05-07 ENCOUNTER — Other Ambulatory Visit (HOSPITAL_COMMUNITY)
Admission: RE | Admit: 2020-05-07 | Discharge: 2020-05-07 | Disposition: A | Discharge status: Home / Self Care | Payer: Medicaid Other | Source: Ambulatory Visit | Attending: Infectious Disease | Admitting: Infectious Disease

## 2020-05-07 ENCOUNTER — Other Ambulatory Visit: Payer: Medicaid Other

## 2020-05-07 ENCOUNTER — Other Ambulatory Visit: Payer: Self-pay

## 2020-05-07 DIAGNOSIS — B20 Human immunodeficiency virus [HIV] disease: Secondary | ICD-10-CM | POA: Insufficient documentation

## 2020-05-07 DIAGNOSIS — E1165 Type 2 diabetes mellitus with hyperglycemia: Secondary | ICD-10-CM | POA: Diagnosis not present

## 2020-05-07 DIAGNOSIS — E782 Mixed hyperlipidemia: Secondary | ICD-10-CM | POA: Insufficient documentation

## 2020-05-07 DIAGNOSIS — K219 Gastro-esophageal reflux disease without esophagitis: Secondary | ICD-10-CM

## 2020-05-07 DIAGNOSIS — G43809 Other migraine, not intractable, without status migrainosus: Secondary | ICD-10-CM

## 2020-05-07 DIAGNOSIS — Z79899 Other long term (current) drug therapy: Secondary | ICD-10-CM

## 2020-05-07 DIAGNOSIS — L0292 Furuncle, unspecified: Secondary | ICD-10-CM | POA: Diagnosis not present

## 2020-05-07 NOTE — Addendum Note (Signed)
Addended byMeriel Pica F on: 05/07/2020 12:05 PM   Modules accepted: Orders

## 2020-05-08 LAB — MICROALBUMIN / CREATININE URINE RATIO
Creatinine, Urine: 23 mg/dL (ref 20–275)
Microalb Creat Ratio: 52 mcg/mg creat — ABNORMAL HIGH (ref <30)
Microalb, Ur: 1.2 mg/dL

## 2020-05-08 LAB — T-HELPER CELL (CD4) - (RCID CLINIC ONLY)
CD4 % Helper T Cell: 36 % (ref 33–65)
CD4 T Cell Abs: 975 /uL (ref 400–1790)

## 2020-05-08 LAB — URINE CYTOLOGY ANCILLARY ONLY
Chlamydia: NEGATIVE
Comment: NEGATIVE
Comment: NORMAL
Neisseria Gonorrhea: NEGATIVE

## 2020-05-10 LAB — CBC WITH DIFFERENTIAL/PLATELET
Absolute Monocytes: 396 cells/uL (ref 200–950)
Basophils Absolute: 44 cells/uL (ref 0–200)
Basophils Relative: 0.5 %
Eosinophils Absolute: 185 cells/uL (ref 15–500)
Eosinophils Relative: 2.1 %
HCT: 45.6 % — ABNORMAL HIGH (ref 35.0–45.0)
Hemoglobin: 14.8 g/dL (ref 11.7–15.5)
Lymphs Abs: 3045 cells/uL (ref 850–3900)
MCH: 26.9 pg — ABNORMAL LOW (ref 27.0–33.0)
MCHC: 32.5 g/dL (ref 32.0–36.0)
MCV: 82.8 fL (ref 80.0–100.0)
MPV: 9 fL (ref 7.5–12.5)
Monocytes Relative: 4.5 %
Neutro Abs: 5130 cells/uL (ref 1500–7800)
Neutrophils Relative %: 58.3 %
Platelets: 267 10*3/uL (ref 140–400)
RBC: 5.51 10*6/uL — ABNORMAL HIGH (ref 3.80–5.10)
RDW: 15.1 % — ABNORMAL HIGH (ref 11.0–15.0)
Total Lymphocyte: 34.6 %
WBC: 8.8 10*3/uL (ref 3.8–10.8)

## 2020-05-10 LAB — COMPLETE METABOLIC PANEL WITH GFR
AG Ratio: 1.5 (calc) (ref 1.0–2.5)
ALT: 13 U/L (ref 6–29)
AST: 12 U/L (ref 10–35)
Albumin: 4.8 g/dL (ref 3.6–5.1)
Alkaline phosphatase (APISO): 79 U/L (ref 37–153)
BUN: 24 mg/dL (ref 7–25)
CO2: 25 mmol/L (ref 20–32)
Calcium: 10.3 mg/dL (ref 8.6–10.4)
Chloride: 96 mmol/L — ABNORMAL LOW (ref 98–110)
Creat: 0.79 mg/dL (ref 0.50–1.05)
GFR, Est African American: 99 mL/min/{1.73_m2} (ref >=60)
GFR, Est Non African American: 85 mL/min/{1.73_m2} (ref >=60)
Globulin: 3.1 g/dL (calc) (ref 1.9–3.7)
Glucose, Bld: 193 mg/dL — ABNORMAL HIGH (ref 65–99)
Potassium: 4.7 mmol/L (ref 3.5–5.3)
Sodium: 135 mmol/L (ref 135–146)
Total Bilirubin: 0.3 mg/dL (ref 0.2–1.2)
Total Protein: 7.9 g/dL (ref 6.1–8.1)

## 2020-05-10 LAB — LIPID PANEL
Cholesterol: 337 mg/dL — ABNORMAL HIGH (ref <200)
HDL: 47 mg/dL — ABNORMAL LOW (ref >=50)
Non-HDL Cholesterol (Calc): 290 mg/dL (calc) — ABNORMAL HIGH (ref <130)
Total CHOL/HDL Ratio: 7.2 (calc) — ABNORMAL HIGH (ref <5.0)
Triglycerides: 617 mg/dL — ABNORMAL HIGH (ref <150)

## 2020-05-10 LAB — HIV-1 RNA QUANT-NO REFLEX-BLD
HIV 1 RNA Quant: <20 Copies/mL — ABNORMAL HIGH
HIV-1 RNA Quant, Log: <1.3 Log cps/mL — ABNORMAL HIGH

## 2020-05-10 LAB — RPR: RPR Ser Ql: NONREACTIVE

## 2020-05-11 ENCOUNTER — Other Ambulatory Visit: Payer: Self-pay | Admitting: Infectious Disease

## 2020-05-20 ENCOUNTER — Ambulatory Visit: Payer: Medicaid Other | Admitting: Infectious Disease

## 2020-05-20 ENCOUNTER — Other Ambulatory Visit: Payer: Self-pay

## 2020-05-20 ENCOUNTER — Encounter: Payer: Self-pay | Admitting: Infectious Disease

## 2020-05-20 VITALS — BP 129/84 | HR 114 | Temp 98.3°F | Wt 282.0 lb

## 2020-05-20 DIAGNOSIS — L03311 Cellulitis of abdominal wall: Secondary | ICD-10-CM

## 2020-05-20 DIAGNOSIS — L0390 Cellulitis, unspecified: Secondary | ICD-10-CM | POA: Insufficient documentation

## 2020-05-20 DIAGNOSIS — Z23 Encounter for immunization: Secondary | ICD-10-CM | POA: Diagnosis not present

## 2020-05-20 DIAGNOSIS — Z6841 Body Mass Index (BMI) 40.0 and over, adult: Secondary | ICD-10-CM | POA: Diagnosis not present

## 2020-05-20 DIAGNOSIS — E782 Mixed hyperlipidemia: Secondary | ICD-10-CM | POA: Diagnosis not present

## 2020-05-20 DIAGNOSIS — B20 Human immunodeficiency virus [HIV] disease: Secondary | ICD-10-CM

## 2020-05-20 HISTORY — DX: Cellulitis, unspecified: L03.90

## 2020-05-20 MED ORDER — AMOXICILLIN-POT CLAVULANATE 875-125 MG PO TABS: 1.0000 | ORAL_TABLET | Freq: Two times a day (BID) | ORAL | 0 refills | Status: DC
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
## 2020-05-27 ENCOUNTER — Other Ambulatory Visit: Payer: Self-pay

## 2020-05-27 ENCOUNTER — Ambulatory Visit: Payer: Medicaid Other | Admitting: Neurology

## 2020-05-27 ENCOUNTER — Encounter: Payer: Self-pay | Admitting: Neurology

## 2020-05-27 VITALS — BP 140/90 | HR 86 | Ht 66.0 in | Wt 291.0 lb

## 2020-05-27 DIAGNOSIS — G43719 Chronic migraine without aura, intractable, without status migrainosus: Secondary | ICD-10-CM

## 2020-05-27 MED ORDER — ZOLMITRIPTAN 5 MG PO TABS
ORAL_TABLET | ORAL | 11 refills | Status: DC
Start: 2020-05-27 — End: 2021-05-05

## 2020-05-27 MED ORDER — VENLAFAXINE HCL ER 150 MG PO CP24
150.0000 mg | ORAL_CAPSULE | Freq: Every day | ORAL | 4 refills | Status: DC
Start: 2020-05-27 — End: 2021-03-01

## 2020-05-27 NOTE — Progress Notes (Signed)
PATIENT: [name] DOB: 27-Mar-1966  Chief Complaint  Patient presents with  . Migraine    Botox     HISTORICAL  NEEYA May, Is accompanied by her sister returned for Botox injection as migraine prevention  She has past medical history of HIV, hyperlipidemia, migraines, depression, and non-Hodgkin lymphoma in 2005 with chemotherapy and radiation.   She has history of migraines since 19. In the last 10 years, she reports having daily migraines. She is currently taking Topamax and Zomig with relief. She takes approximately 20-25 tablets of her Zomig per month.    She has aura's that look like floating dirt particles, flashing lights, and she sees Christmas tree lights when they are severe, she has nausea, vomiting and sometimes diarrhea when they're severe. She also complains of diplopia, blurry vision, and  photophobia. She also has had a lightning bolt sensation throughout her body.  She describes them as pulsating and throbbing  on a scale of 5-6/10 which she considers mild, when they are severe they can go as high as 10/10. They usually start in the right temporal area, but will also start on the left temporal and are worse.   Any type of smoke or MSG can precipitate a migraine. They generally last 2-9 days. She has been to the emergency department twice in the last year for migraines. She states "I just can't do this anymore I need to do something about her migraines"  She has tried and failed different preventive medications:  atenolol, verapamil, nortriptyline, Inderal, Depakote without benefit. Atenolol and verapamil caused hypotension. Depakote caused severe vomiting. Topamax causing her worsening headaches, will show tried Zonegran, Topamax ER, she denies significant improvement.  For abortive treatment, she has tried Imitrex, Maxalt with suboptimal response, Zomig as needed since to work best,  she also takes transderm scophalamine patch for nause  I started to give her  BOTOX injection since May 2013, every 3 months, she responded very well,   Previous MRI brain was normal in 2013.  Last Botox injection as migraine prevention was March 2017, she responded very well, but in recent few weeks, she noticed increased migraine headaches, this baby had headache left retro-orbital area severe pounding headache with associated light noise sensitivity, has been ongoing for 3 days, failed to respond to Zomig by mouth, Imitrex injection as needed, she has constant dry heaves, difficulty opening her eyes, In addition, today she complains of restless leg symptoms, difficult to stand or lying still for extended period of time, has tried Neurontin-cause nausea, confusion,  Lyrica-cause excessive weight gain, she has never tried dopamine agonist in the past  I reviewed laboratory in April 2017, normal hemoglobin 12 point 9, mild elevated RDW 15.8, decreased MCV  Update April 27 2016  Last Botox injection was in June 2017, she had left knee arthroscopic surgery in August 2017, complaining of significant joints pain, unsteady gait, before Botox she was having moderate to severe headache daily, even Botox now, she used to 12 tablets of Zomig every months because of frequent moderate to severe headache  Update August 10 2016: Last Botox injection was in October 2017, has been very helpful, but over the past 3-4 weeks, she had frequent almost daily headaches, oftentimes wake her up from sleep, Zomig has been very helpful,  Low dose Mirapex 0.25 milligrams 2 tablets every night has been very helpful for her restless leg symptoms, she reported 80% improvement, laboratory reviewed in January 2018, CD4 count 760, normal hemoglobin  12.3, mildly decreased MCV, increased RDW,  Update Nov 09 2016: She suffered bilateral ear infection, strep throat since last visit, had frequent headaches, nausea,  Update February 22 2017: She responded well to previous injection, complains of a lot of  stress at home  UPDATE May 31 2017: She complains suboptimal response to Botox injection, had daily headache over last week, also complains of a lot of stress at home, average more than 4 headaches in 1 week, lasting all day, with associated nausea vomiting, is taking Zomig as needed, with partial response  UPDATE September 08 2017: She complains of excessive stress, frequent migraine headaches,  UPDATE December 20 2017: She is accompanied by her sister at today's clinical visit, Ajovy injection has helped her migraine, she rated as 25%, but with the combination of Ajovy and Botox injection, she still has 3 migraine headaches each week, has been dealing with daily headaches over the past 1 week, required multiple dose of Zomig treatment  UPDATE Sept 25 2019: She responded well to previous injection, today she complains of 2 weeks history of severe headaches failed home remedy treatment, multiple dose of Zomig,  She is also getting Ajovy injection every month, which has been helpful,  UPDATE Jun 21 2018: she overall responding well to Botox, Ajovy, also taking amitriptyline 25 mg 2 tablets every night, Zomig as needed for abortive treatment, couple headaches each week, usually responding well to triptan treatment, but this particular headache been ongoing for 2 weeks, tried and failed multiple home medications.  UPDATE September 20 2018: She responded well to previous injection, but at the end of injection cycle,She would have more frequent prolonged headaches, she used Zomig as needed,  UPDATE December 19 2018: She responded very well to previous injection.  UPDATE Apr 17 2019: She is with her sister at today's visit, she reported a lot of stress at home, has increased frequency of migraines, almost daily basis over the past few weeks, she also suffered depression anxiety  UPDATE Aug 07 2019: She continues to have frequent headaches at home.  Lamotrigine and Elavil were not helpful, she has stopped taking  them, Botox injection along with aimovig were very helpful.  UPDATE Nov 06 2019: She return for Botox injection as migraine prevention, previous nerve block in combination with Depakote on October 31, 2019 has helped her headache  UPDATE January 15 2020: This is an earlier than expected visit because of one month history of daily severe headache for 1 month, 8 out of 10, holoacranial, light noise sensitivity, nauseous, frequent vomiting, she use zomig 12m 12 tablets each month, she also tried multiple over-the-counter aspirin, tizanidine 4 mg twice a day  She is on polypharmacy for HTN, HLD, DM, urinary retention, taking flomax, planning on cath twice a week,  See urologist Dr. JIrine Seal   She also complains of depression, has not seen pyschiatrist for long time, previous tried zoloft, Cymbalta, wellbutrin, paxil, did not help, complains of unbearable side effect.  Chronic insomnia, ambien 129m she can only sleep 4 to 5 hours maximum,  I have personally reviewed MRI brain wo in 2013 was normal.   Laboratory evaluation in 2020, CD4 cell was 908, 35% helper cell, within normal limit.  Low normal B12 292, normal CBC, A1c was 7.7 Update office February 12 2020 Her headache has improved with adding Effexor XR 37.5 mg daily, tolerating the medicine well, return for Botox injection as migraine prevention today, also Aimovig 140 mg monthly, she complains of diffuse  body achy pain  Update May 27, 2020 She complains of worsening migraine headaches, despite higher dose of Effexor XR 75 mg daily, also complains of worsening depression, hope to take higher dose of Effexor,  REVIEW OF SYSTEMS: Full 14 system review of systems performed and notable only for as above  ALLERGIES: Allergies  Allergen Reactions  . Diazepam     Triggers migraines  . Divalproex Sodium Nausea And Vomiting    Causes light-headedness  . Gabapentin Nausea And Vomiting    confusion  . Ondansetron Hcl Nausea And Vomiting     Triggers migraines, "makes me vomit"  . Pineapple   . Propoxyphene N-Acetaminophen Nausea And Vomiting  . Simvastatin     PATIENT CANNOT BE PRESCRIBED THIS STATIN WHILE RECEIVING NORVIR  . Topamax [Topiramate]     Migraines   . Tramadol Hcl Itching    REACTION: migraines and itching--hx of migraines even without medication   . Zocor [Simvastatin - High Dose]   . Penicillins     REACTION: lips swollen to point of bleeding, sever vaginal irritation  Reports tolerating augmentin well without any complaints or reaction    HOME MEDICATIONS: Current Outpatient Medications  Medication Sig Dispense Refill  . AIMOVIG 140 MG/ML SOAJ INJECT 1 AUTO-INJECTOR INTO THE SKIN EVERY 30 DAYS AS DIRECTED 1 mL 11  . albuterol (PROAIR HFA) 108 (90 Base) MCG/ACT inhaler USE 1 TO 2 PUFFS EVERY 4-6 HOURS AS NEEDED FOR SHORTNESS OF BREATH ORWHEEZING 8.5 g 1  . amoxicillin-clavulanate (AUGMENTIN) 875-125 MG tablet Take 1 tablet by mouth 2 (two) times daily. 28 tablet 0  . Beclomethasone Dipropionate 80 MCG/ACT AERS Place 2 Inhalers into the nose daily. 8.7 g 10  . blood glucose meter kit and supplies Dispense based on patient and insurance preference. Use up to four times daily as directed. (FOR ICD-9 250.00, 250.01). 1 each 0  . botulinum toxin Type A (BOTOX) 100 units SOLR injection FOR OFFICE ADMINISTRATION. PHYSICIAN TO INJECT UP TO 200 UNITS INTRAMUSCULARLY INTO HEAD, FACE AND NECK AREA EVERY 90 DAYS. 2 each 3  . Calcium Carbonate-Vitamin D (CALCIUM-VITAMIN D) 500-200 MG-UNIT per tablet Take 2 tablets by mouth daily.     . diclofenac Sodium (VOLTAREN) 1 % GEL APPLY TOPICALLY TO AFFECTED AREA 4 GRAMS4 TIMES DAILY 100 g 11  . FARXIGA 10 MG TABS tablet TAKE 1 TABLET BY MOUTH EVERY DAY 90 tablet 3  . fluconazole (DIFLUCAN) 100 MG tablet TAKE 1 TABLET BY MOUTH EVERY DAY 14 tablet 3  . glucose blood test strip Use as instructed 100 each 12  . ibuprofen (ADVIL) 800 MG tablet TAKE 1 TABLET BY MOUTH EVERY 8 HOURS AS  NEEDED 30 tablet 5  . liraglutide (VICTOZA) 18 MG/3ML SOPN Inject 0.6 mg daily for 1 week, then increase to 1.2 mg daily 3 pen 0  . lisinopril (ZESTRIL) 20 MG tablet TAKE 1 TABLET BY MOUTH EVERY DAY 90 tablet 1  . metroNIDAZOLE (METROGEL) 0.75 % gel Apply 1 application topically 2 (two) times daily.    . mometasone-formoterol (DULERA) 100-5 MCG/ACT AERO USE 2 PUFFS BY MOUTH THE FIRST THING IN THE MORNING AND THEN ANOTHER 2 PUFFS ABOUT 12 HOURS LATER 13 g 5  . pantoprazole (PROTONIX) 40 MG tablet TAKE 1 TABLET BY MOUTH 2 TIMES A DAY FORACID REFLUX 60 tablet 4  . Plecanatide (TRULANCE) 3 MG TABS Take 1 tablet by mouth daily. 30 tablet 11  . pravastatin (PRAVACHOL) 40 MG tablet TAKE 1 TABLET BY MOUTH EVERY  DAY FOR CHOLESTEROL 90 tablet 1  . promethazine (PHENERGAN) 25 MG suppository Place 25 mg rectally every 6 (six) hours as needed for nausea or vomiting.    . propranolol (INDERAL) 20 MG tablet Take 1 tablet (20 mg total) by mouth 2 (two) times daily. 60 tablet 6  . SYMTUZA 800-150-200-10 MG TABS TAKE 1 TABLET BY MOUTH EVERY DAY WITH BREAKFAST 30 tablet 0  . Tamsulosin HCl (FLOMAX) 0.4 MG CAPS Take 0.4 mg by mouth daily. 1-2 daily    . terconazole (TERAZOL 7) 0.4 % vaginal cream PLACE 1 APPLICATORFUL VAGINALLY AT BEDTIME 45 g 3  . tiZANidine (ZANAFLEX) 4 MG tablet Take 1 tablet (4 mg total) by mouth every 8 (eight) hours as needed for muscle spasms. 90 tablet 11  . venlafaxine XR (EFFEXOR XR) 75 MG 24 hr capsule Take 1 capsule (75 mg total) by mouth daily with breakfast. 90 capsule 4  . zolmitriptan (ZOMIG) 5 MG tablet Take 1 tab at onset of migraine.  May repeat, with 1 tab, in 2 hrs, if needed.  Max dose of 2 tabs/24hrs.  Must last 30 days. #12/30 12 tablet 11  . zolpidem (AMBIEN) 10 MG tablet TAKE 1 TABLET BY MOUTH EACH NIGHT AT BEDTIME AS NEEDED SLEEP 30 tablet 5   No current facility-administered medications for this visit.    PAST MEDICAL HISTORY: Past Medical History:  Diagnosis Date  .  Acute gastritis without mention of hemorrhage 01/01/2002  . Anemia 10/2009   macrocytic anemia with baseline MCV 104-106  . Anxiety   . Asthma   . Boil 01/05/2017  . Cellulitis 05/20/2020  . 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 Complex tear of medial meniscus of left knee as current injury 02/25/2016  . Complication of anesthesia    hard to wake up  . COPD (chronic obstructive pulmonary disease) (HCC)    chronic bronchitis  . Derangement of anterior horn of lateral meniscus of left knee 02/25/2016  . Duodenitis without mention of hemorrhage 01/01/2002  . DVT (deep venous thrombosis) (HCC)    history clot lt groin when she had lymphoma-in remision now.  . Elevated triglycerides with high cholesterol 01/05/2017  . Gastroparesis   . GERD (gastroesophageal reflux disease)   . Hiatal hernia 01/01/2002  . HIV infection ([place])    CD4 = 570 (05/2010), VL undetectation  . Hyperlipidemia   . IBS (irritable bowel syndrome)   . Migraines    on topamax and triptans, frequent (almost daily) attacks   . MRSA (methicillin resistant staph aureus) culture positive 12/20/2010  . Non Hodgkin's lymphoma ([place])    stage II, s/p resection, chemotherapy, radiotherapy, Dr. Beryle Beams is her oncologist.   . PUD (peptic ulcer disease) 07/1997   per EGD report 07/1997 with history of esophagitis  . Restless leg syndrome 12/24/2015  . Shingles    in lumbar dermatome  . Spondylolisthesis of lumbar region    L5-S1  . TMJ (dislocation of temporomandibular joint)   . Traumatic tear of lateral meniscus of right knee 04/20/2012  . Vaginal yeast infection 09/25/2017    PAST SURGICAL HISTORY: Past Surgical History:  Procedure Laterality Date  . CHOLECYSTECTOMY    . ENDOMETRIAL ABLATION    . KNEE ARTHROSCOPY Right 04/20/12  . KNEE ARTHROSCOPY WITH LATERAL MENISECTOMY Left 02/25/2016   Procedure: LEFT KNEE ARTHROSCOPY WITH MEDIAL AND  LATERAL MENISECTOMIES;  Surgeon: Marchia Bond, MD;  Location: [facility];  Service: Orthopedics;  Laterality: Left;  . KNEE ARTHROSCOPY  WITH MEDIAL MENISECTOMY Left 02/25/2016   Procedure: KNEE ARTHROSCOPY WITH MEDIAL MENISECTOMY;  Surgeon: Marchia Bond, MD;  Location: [facility];  Service: Orthopedics;  Laterality: Left;  . TONSILLECTOMY    . TYMPANOPLASTY Left   . TYMPANOSTOMY TUBE PLACEMENT     as child-both  . UPPER GASTROINTESTINAL ENDOSCOPY  2003, 2007   done by Dr. Velora Heckler    FAMILY HISTORY: Family History  Problem Relation Age of Onset  . Hypertension Mother   . Stroke Mother   . Heart disease Mother   . Diabetes Mother   . Colon polyps Mother   . Breast cancer Paternal Grandmother   . Kidney cancer Maternal Uncle   . Heart disease Maternal Uncle   . Diabetes Maternal Uncle   . Diabetes Maternal Grandmother   . Heart disease Maternal Grandmother   . Emphysema Paternal Uncle        never smoker  . Emphysema Paternal Grandfather        never smoker  . Colon cancer Paternal Aunt        with met to lung   . Non-Hodgkin's lymphoma Maternal Uncle   . Heart disease Maternal Grandfather     SOCIAL HISTORY:  Social History   Socioeconomic History  . Marital status: Divorced    Spouse name: Not on file  . Number of children: 0  . Years of education: 54  . Highest education level: Not on file  Occupational History  . Occupation: disabled    Fish farm manager: UNEMPLOYED  Tobacco Use  . Smoking status: Never Smoker  . Smokeless tobacco: Never Used  Vaping Use  . Vaping Use: Never used  Substance and Sexual Activity  . Alcohol use: No    Alcohol/week: 0.0 standard drinks  . Drug use: No  . Sexual activity: Not Currently    Partners: Male    Birth control/protection: Surgical  Other Topics Concern  . Not on file  Social History Narrative      Patient is disabled.   Right handed.   Caffeine - Diet soda., tea   Education - high school   Social Determinants of Health   Financial Resource Strain:   . Difficulty of  Paying Living Expenses: Not on file  Food Insecurity:   . Worried About Charity fundraiser in the Last Year: Not on file  . Ran Out of Food in the Last Year: Not on file  Transportation Needs:   . Lack of Transportation (Medical): Not on file  . Lack of Transportation (Non-Medical): Not on file  Physical Activity:   . Days of Exercise per Week: Not on file  . Minutes of Exercise per Session: Not on file  Stress:   . Feeling of Stress : Not on file  Social Connections:   . Frequency of Communication with Friends and Family: Not on file  . Frequency of Social Gatherings with Friends and Family: Not on file  . Attends Religious Services: Not on file  . Active Member of Clubs or Organizations: Not on file  . Attends Archivist Meetings: Not on file  . Marital Status: Not on file  Intimate Partner Violence:   . Fear of Current or Ex-Partner: Not on file  . Emotionally Abused: Not on file  . Physically Abused: Not on file  . Sexually Abused: Not on file     PHYSICAL EXAM   Vitals:   05/27/20 1432  BP: 140/90  Pulse: 86  Weight: 291 lb (  132 kg)  Height: $Remove'5\' 6"'KqWTmIv$  (1.676 m)   Not recorded    ASSESSMENT AND PLAN  [place] SARINANA is a 54 y.o. female    History of HIV, under good control, CD4 was 908 within normal limit, Chronic migraine headaches Depression anxiety, Chronic insomnia History of non-Hodgkin's lymphoma, completed with chemo therapy  Her significant daily migraine headaches happened in the scenario of poorly controlled depression anxiety, chronic insomnia,  Zomig as needed, may combine it with tizanidine, Zofran, Aleve, only treat moderate to severe headaches,  For migraine prevention she is on Botox injection, Aimovig 140 mg monthly, increase to higher dose of Effexor xr 150 mg daily  Botox injection for chronic migraine prevention, injection was performed according to [facility] protocol,  5 units of Botox was injected into each side, for 31 injection  sites, total of 155 units  Bilateral frontalis 4 injection sites Bilateral corrugate 2 injection sites Procerus 1 injection sites. Bilateral temporalis 8 injection sites Bilateral occipitalis 6 injection sites Bilateral cervical paraspinals 4 injection sites Bilateral upper trapezius 6 injection sites  Extra 45 unites were injected into bilateral cervical paraspinal and upper trapezius  Complains of intractable headache for 3 weeks, failed multiple home remedies, will do IV infusion Depacon 1000 mg, with Toradol 30 mg, Compazine 10 mg IV  Marcial Pacas, M.D. Ph.D.  [facility] [place], [place] [address] Phone: [phone] Fax:      [phone]

## 2020-05-27 NOTE — Progress Notes (Signed)
**  Botox 100 units x 2 vials, NDC 0475-3391-79, Lot [id number], Exp 04/2022, specialty pharmacy.//mck,rn**

## 2020-05-29 ENCOUNTER — Other Ambulatory Visit: Payer: Self-pay | Admitting: Obstetrics

## 2020-05-29 DIAGNOSIS — B3731 Acute candidiasis of vulva and vagina: Secondary | ICD-10-CM

## 2020-05-29 DIAGNOSIS — B373 Candidiasis of vulva and vagina: Secondary | ICD-10-CM

## 2020-06-03 ENCOUNTER — Other Ambulatory Visit: Payer: Self-pay | Admitting: *Deleted

## 2020-06-03 DIAGNOSIS — I10 Essential (primary) hypertension: Secondary | ICD-10-CM

## 2020-06-03 MED ORDER — LISINOPRIL 20 MG PO TABS: 20.0000 mg | ORAL_TABLET | Freq: Every day | ORAL | 1 refills | Status: DC

## 2020-06-08 ENCOUNTER — Other Ambulatory Visit: Payer: Self-pay | Admitting: Infectious Disease

## 2020-06-24 ENCOUNTER — Other Ambulatory Visit: Payer: Self-pay | Admitting: Internal Medicine

## 2020-06-24 ENCOUNTER — Other Ambulatory Visit: Payer: Self-pay | Admitting: Neurology

## 2020-06-24 DIAGNOSIS — E118 Type 2 diabetes mellitus with unspecified complications: Secondary | ICD-10-CM

## 2020-07-14 ENCOUNTER — Telehealth: Payer: Self-pay | Admitting: Neurology

## 2020-07-14 NOTE — Telephone Encounter (Signed)
I spoke with patient and she states that she's been taking her preventative medications like normal and has tried her rescue meds, but this migraine has continued for about 2 weeks now. She is requesting to come in tomorrow for an infusion. Please advise.

## 2020-07-14 NOTE — Telephone Encounter (Signed)
Pt called, for two weeks having migraines. I need a infusion.  Would like a call from the nurse.

## 2020-07-14 NOTE — Telephone Encounter (Signed)
I spoke to patient and she is available to come tomorrow around 1pm, this is ok per Liane in Infusion. I will write up orders and leave for Liane. Patient voiced appreciation.

## 2020-07-14 NOTE — Telephone Encounter (Signed)
Please check with intrafusion, it is ok for her to come in for iv infusion.  Depakote 500 mg twice Compazine 10 mg Toradol 30 mg

## 2020-07-23 ENCOUNTER — Telehealth: Payer: Self-pay | Admitting: Neurology

## 2020-07-23 NOTE — Telephone Encounter (Signed)
Patient has a Botox appointment on 2/23. I received a fax from [facility] stating they have been trying to reach the patient for shipment consent. I called the patient and she states she had not received any calls or messages. I provided her with the number to [facility] & she states she will call this afternoon.

## 2020-07-27 NOTE — Telephone Encounter (Signed)
I called [facility] and spoke with Rowi to check status. Botox TBD 1/26.

## 2020-07-29 NOTE — Telephone Encounter (Signed)
(  2) 100U vials of Botox delivered today from [facility]. 

## 2020-07-30 ENCOUNTER — Other Ambulatory Visit: Payer: Self-pay | Admitting: Neurology

## 2020-07-30 ENCOUNTER — Other Ambulatory Visit: Payer: Self-pay | Admitting: *Deleted

## 2020-08-12 DIAGNOSIS — R3914 Feeling of incomplete bladder emptying: Secondary | ICD-10-CM | POA: Diagnosis not present

## 2020-08-12 DIAGNOSIS — R351 Nocturia: Secondary | ICD-10-CM | POA: Diagnosis not present

## 2020-08-26 ENCOUNTER — Encounter: Payer: Self-pay | Admitting: Neurology

## 2020-08-26 ENCOUNTER — Other Ambulatory Visit: Payer: Self-pay

## 2020-08-26 ENCOUNTER — Other Ambulatory Visit: Payer: Self-pay | Admitting: Infectious Disease

## 2020-08-26 ENCOUNTER — Ambulatory Visit: Payer: Medicaid Other | Admitting: Neurology

## 2020-08-26 VITALS — BP 111/64 | HR 67 | Wt 292.5 lb

## 2020-08-26 DIAGNOSIS — G43719 Chronic migraine without aura, intractable, without status migrainosus: Secondary | ICD-10-CM | POA: Diagnosis not present

## 2020-08-26 NOTE — Progress Notes (Signed)
**  Botox 100 units x 2 vials, NDC 1798-1025-48, Lot [id number], Exp 08/2022, specialty pharmacy.//mck,rn**

## 2020-08-26 NOTE — Progress Notes (Signed)
PATIENT: [name] DOB: [birth year]  Chief Complaint  Patient presents with  . Procedure    Migraine - Botox     HISTORICAL  [name], Is accompanied by her sister returned for Botox injection as migraine prevention  She has past medical history of HIV, hyperlipidemia, migraines, depression, and non-Hodgkin lymphoma in 2005 with chemotherapy and radiation.   She has history of migraines since 19. In the last 10 years, she reports having daily migraines. She is currently taking Topamax and Zomig with relief. She takes approximately 20-25 tablets of her Zomig per month.    She has aura's that look like floating dirt particles, flashing lights, and she sees Christmas tree lights when they are severe, she has nausea, vomiting and sometimes diarrhea when they're severe. She also complains of diplopia, blurry vision, and  photophobia. She also has had a lightning bolt sensation throughout her body.  She describes them as pulsating and throbbing  on a scale of 5-6/10 which she considers mild, when they are severe they can go as high as 10/10. They usually start in the right temporal area, but will also start on the left temporal and are worse.   Any type of smoke or MSG can precipitate a migraine. They generally last 2-9 days. She has been to the emergency department twice in the last year for migraines. She states "I just can't do this anymore I need to do something about her migraines"  She has tried and failed different preventive medications:  atenolol, verapamil, nortriptyline, Inderal, Depakote without benefit. Atenolol and verapamil caused hypotension. Depakote caused severe vomiting. Topamax causing her worsening headaches, will show tried Zonegran, Topamax ER, she denies significant improvement.  For abortive treatment, she has tried Imitrex, Maxalt with suboptimal response, Zomig as needed since to work best,  she also takes transderm scophalamine patch for nause  I started to  give her BOTOX injection since May 2013, every 3 months, she responded very well,   Previous MRI brain was normal in 2013.  Last Botox injection as migraine prevention was March 2017, she responded very well, but in recent few weeks, she noticed increased migraine headaches, this baby had headache left retro-orbital area severe pounding headache with associated light noise sensitivity, has been ongoing for 3 days, failed to respond to Zomig by mouth, Imitrex injection as needed, she has constant dry heaves, difficulty opening her eyes, In addition, today she complains of restless leg symptoms, difficult to stand or lying still for extended period of time, has tried Neurontin-cause nausea, confusion,  Lyrica-cause excessive weight gain, she has never tried dopamine agonist in the past  I reviewed laboratory in April 2017, normal hemoglobin 12 point 9, mild elevated RDW 15.8, decreased MCV  Update April 27 2016  Last Botox injection was in June 2017, she had left knee arthroscopic surgery in August 2017, complaining of significant joints pain, unsteady gait, before Botox she was having moderate to severe headache daily, even Botox now, she used to 12 tablets of Zomig every months because of frequent moderate to severe headache  Update August 10 2016: Last Botox injection was in October 2017, has been very helpful, but over the past 3-4 weeks, she had frequent almost daily headaches, oftentimes wake her up from sleep, Zomig has been very helpful,  Low dose Mirapex 0.25 milligrams 2 tablets every night has been very helpful for her restless leg symptoms, she reported 80% improvement, laboratory reviewed in January 2018, CD4 count 760,  normal hemoglobin 12.3, mildly decreased MCV, increased RDW,  Update Nov 09 2016: She suffered bilateral ear infection, strep throat since last visit, had frequent headaches, nausea,  Update February 22 2017: She responded well to previous injection, complains of a  lot of stress at home  UPDATE May 31 2017: She complains suboptimal response to Botox injection, had daily headache over last week, also complains of a lot of stress at home, average more than 4 headaches in 1 week, lasting all day, with associated nausea vomiting, is taking Zomig as needed, with partial response  UPDATE September 08 2017: She complains of excessive stress, frequent migraine headaches,  UPDATE December 20 2017: She is accompanied by her sister at today's clinical visit, Ajovy injection has helped her migraine, she rated as 25%, but with the combination of Ajovy and Botox injection, she still has 3 migraine headaches each week, has been dealing with daily headaches over the past 1 week, required multiple dose of Zomig treatment  UPDATE Sept 25 2019: She responded well to previous injection, today she complains of 2 weeks history of severe headaches failed home remedy treatment, multiple dose of Zomig,  She is also getting Ajovy injection every month, which has been helpful,  UPDATE Jun 21 2018: she overall responding well to Botox, Ajovy, also taking amitriptyline 25 mg 2 tablets every night, Zomig as needed for abortive treatment, couple headaches each week, usually responding well to triptan treatment, but this particular headache been ongoing for 2 weeks, tried and failed multiple home medications.  UPDATE September 20 2018: She responded well to previous injection, but at the end of injection cycle,She would have more frequent prolonged headaches, she used Zomig as needed,  UPDATE December 19 2018: She responded very well to previous injection.  UPDATE Apr 17 2019: She is with her sister at today's visit, she reported a lot of stress at home, has increased frequency of migraines, almost daily basis over the past few weeks, she also suffered depression anxiety  UPDATE Aug 07 2019: She continues to have frequent headaches at home.  Lamotrigine and Elavil were not helpful, she has stopped  taking them, Botox injection along with aimovig were very helpful.  UPDATE Nov 06 2019: She return for Botox injection as migraine prevention, previous nerve block in combination with Depakote on October 31, 2019 has helped her headache  UPDATE January 15 2020: This is an earlier than expected visit because of one month history of daily severe headache for 1 month, 8 out of 10, holoacranial, light noise sensitivity, nauseous, frequent vomiting, she use zomig 63m 12 tablets each month, she also tried multiple over-the-counter aspirin, tizanidine 4 mg twice a day  She is on polypharmacy for HTN, HLD, DM, urinary retention, taking flomax, planning on cath twice a week,  See urologist Dr. JIrine Seal   She also complains of depression, has not seen pyschiatrist for long time, previous tried zoloft, Cymbalta, wellbutrin, paxil, did not help, complains of unbearable side effect.  Chronic insomnia, ambien 162m she can only sleep 4 to 5 hours maximum,  I have personally reviewed MRI brain wo in 2013 was normal.   Laboratory evaluation in 2020, CD4 cell was 908, 35% helper cell, within normal limit.  Low normal B12 292, normal CBC, A1c was 7.7 Update office February 12 2020 Her headache has improved with adding Effexor XR 37.5 mg daily, tolerating the medicine well, return for Botox injection as migraine prevention today, also Aimovig 140 mg monthly, she complains  of diffuse body achy pain  Update May 27, 2020 She complains of worsening migraine headaches, despite higher dose of Effexor XR 75 mg daily, also complains of worsening depression, hope to take higher dose of Effexor,  Update August 26, 2020: She complains of severe upper neck, occipital area pressure headache over the past 2 to 3 weeks, constant, failed to improve with multiple dose of home medications, overall still responsive to Botox injection per patient, she is also on aimovig  REVIEW OF SYSTEMS: Full 14 system review of systems  performed and notable only for as above  ALLERGIES: Allergies  Allergen Reactions  . Divalproex Sodium Nausea And Vomiting    Causes light-headedness  . Gabapentin Nausea And Vomiting    confusion  . Ondansetron Hcl Nausea And Vomiting    Triggers migraines, "makes me vomit"  . Pineapple   . Propoxyphene N-Acetaminophen Nausea And Vomiting  . Simvastatin     PATIENT CANNOT BE PRESCRIBED THIS STATIN WHILE RECEIVING NORVIR  . Zocor [Simvastatin - High Dose]   . Penicillins     REACTION: lips swollen to point of bleeding, sever vaginal irritation  Reports tolerating augmentin well without any complaints or reaction    HOME MEDICATIONS: Current Outpatient Medications  Medication Sig Dispense Refill  . AIMOVIG 140 MG/ML SOAJ INJECT 1 AUTO-INJECTOR INTO THE SKIN EVERY 30 DAYS AS DIRECTED 1 mL 11  . albuterol (PROAIR HFA) 108 (90 Base) MCG/ACT inhaler USE 1 TO 2 PUFFS EVERY 4-6 HOURS AS NEEDED FOR SHORTNESS OF BREATH ORWHEEZING 8.5 g 1  . amoxicillin-clavulanate (AUGMENTIN) 875-125 MG tablet Take 1 tablet by mouth 2 (two) times daily. 28 tablet 0  . Beclomethasone Dipropionate 80 MCG/ACT AERS Place 2 Inhalers into the nose daily. 8.7 g 10  . blood glucose meter kit and supplies Dispense based on patient and insurance preference. Use up to four times daily as directed. (FOR ICD-9 250.00, 250.01). 1 each 0  . botulinum toxin Type A (BOTOX) 100 units SOLR injection FOR OFFICE ADMINISTRATION. PHYSICIAN TO INJECT UP TO 200 UNITS INTRAMUSCULARLY INTO HEAD, FACE AND NECK AREA EVERY 90 DAYS. 2 each 3  . Calcium Carbonate-Vitamin D (CALCIUM-VITAMIN D) 500-200 MG-UNIT per tablet Take 2 tablets by mouth daily.     . diclofenac Sodium (VOLTAREN) 1 % GEL APPLY TOPICALLY TO AFFECTED AREA 4 GRAMS4 TIMES DAILY 100 g 11  . FARXIGA 10 MG TABS tablet TAKE 1 TABLET BY MOUTH EVERY DAY 90 tablet 3  . fluconazole (DIFLUCAN) 100 MG tablet TAKE 1 TABLET BY MOUTH EVERY DAY 14 tablet 3  . glucose blood test strip  Use as instructed 100 each 12  . ibuprofen (ADVIL) 800 MG tablet TAKE 1 TABLET BY MOUTH EVERY 8 HOURS AS NEEDED 30 tablet 5  . liraglutide (VICTOZA) 18 MG/3ML SOPN Inject 0.6 mg daily for 1 week, then increase to 1.2 mg daily 3 pen 0  . lisinopril (ZESTRIL) 20 MG tablet Take 1 tablet (20 mg total) by mouth daily. 90 tablet 1  . metroNIDAZOLE (METROGEL) 0.75 % gel Apply 1 application topically 2 (two) times daily.    . mometasone-formoterol (DULERA) 100-5 MCG/ACT AERO USE 2 PUFFS BY MOUTH THE FIRST THING IN THE MORNING AND THEN ANOTHER 2 PUFFS ABOUT 12 HOURS LATER 13 g 5  . pantoprazole (PROTONIX) 40 MG tablet TAKE 1 TABLET BY MOUTH 2 TIMES A DAY FORACID REFLUX 60 tablet 4  . Plecanatide (TRULANCE) 3 MG TABS Take 1 tablet by mouth daily. 30 tablet 11  .  pravastatin (PRAVACHOL) 40 MG tablet TAKE 1 TABLET BY MOUTH EVERY DAY FOR CHOLESTEROL 90 tablet 1  . promethazine (PHENERGAN) 25 MG suppository PLACE 1 SUPPOSITORY RECTALLY EVERY 6 HOURS AS NEEDED FOR NAUSEA OR VOMITING 12 each 11  . propranolol (INDERAL) 20 MG tablet TAKE 1 TABLET BY MOUTH 2 TIMES A DAY 60 tablet 3  . SYMTUZA 800-150-200-10 MG TABS TAKE 1 TABLET BY MOUTH EVERY DAY WITH BREAKFAST 30 tablet 5  . Tamsulosin HCl (FLOMAX) 0.4 MG CAPS Take 0.4 mg by mouth daily. 1-2 daily    . terconazole (TERAZOL 7) 0.4 % vaginal cream PLACE 1 APPLICATORFUL VAGINALLY AT BEDTIME 45 g 3  . tiZANidine (ZANAFLEX) 4 MG tablet Take 1 tablet (4 mg total) by mouth every 8 (eight) hours as needed for muscle spasms. 90 tablet 11  . venlafaxine XR (EFFEXOR XR) 150 MG 24 hr capsule Take 1 capsule (150 mg total) by mouth daily with breakfast. 90 capsule 4  . zolmitriptan (ZOMIG) 5 MG tablet Take 1 tab at onset of migraine.  May repeat, with 1 tab, in 2 hrs, if needed.  Max dose of 2 tabs/24hrs.  Must last 30 days. #12/30 12 tablet 11  . zolpidem (AMBIEN) 10 MG tablet TAKE 1 TABLET BY MOUTH EACH NIGHT AT BEDTIME AS NEEDED SLEEP 30 tablet 5   No current  facility-administered medications for this visit.    PAST MEDICAL HISTORY: Past Medical History:  Diagnosis Date  . Acute gastritis without mention of hemorrhage 01/01/2002  . Anemia 10/2009   macrocytic anemia with baseline MCV 104-106  . Anxiety   . Asthma   . Boil 01/05/2017  . Cellulitis 05/20/2020  . 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 Complex tear of medial meniscus of left knee as current injury 02/25/2016  . Complication of anesthesia    hard to wake up  . COPD (chronic obstructive pulmonary disease) (HCC)    chronic bronchitis  . Derangement of anterior horn of lateral meniscus of left knee 02/25/2016  . Duodenitis without mention of hemorrhage 01/01/2002  . DVT (deep venous thrombosis) (HCC)    history clot lt groin when she had lymphoma-in remision now.  . Elevated triglycerides with high cholesterol 01/05/2017  . Gastroparesis   . GERD (gastroesophageal reflux disease)   . Hiatal hernia 01/01/2002  . HIV infection ([place])    CD4 = 570 (05/2010), VL undetectation  . Hyperlipidemia   . IBS (irritable bowel syndrome)   . Migraines    on topamax and triptans, frequent (almost daily) attacks   . MRSA (methicillin resistant staph aureus) culture positive 12/20/2010  . Non Hodgkin's lymphoma ([place])    stage II, s/p resection, chemotherapy, radiotherapy, Dr. Beryle Beams is her oncologist.   . PUD (peptic ulcer disease) 07/1997   per EGD report 07/1997 with history of esophagitis  . Restless leg syndrome 12/24/2015  . Shingles    in lumbar dermatome  . Spondylolisthesis of lumbar region    L5-S1  . TMJ (dislocation of temporomandibular joint)   . Traumatic tear of lateral meniscus of right knee 04/20/2012  . Vaginal yeast infection 09/25/2017    PAST SURGICAL HISTORY: Past Surgical History:  Procedure Laterality Date  . CHOLECYSTECTOMY    . ENDOMETRIAL ABLATION    . KNEE ARTHROSCOPY Right 04/20/12  . KNEE ARTHROSCOPY WITH LATERAL MENISECTOMY Left 02/25/2016   Procedure:  LEFT KNEE ARTHROSCOPY WITH MEDIAL AND  LATERAL MENISECTOMIES;  Surgeon: Marchia Bond, MD;  Location: [facility];  Service:  Orthopedics;  Laterality: Left;  . KNEE ARTHROSCOPY WITH MEDIAL MENISECTOMY Left 02/25/2016   Procedure: KNEE ARTHROSCOPY WITH MEDIAL MENISECTOMY;  Surgeon: Marchia Bond, MD;  Location: [facility];  Service: Orthopedics;  Laterality: Left;  . TONSILLECTOMY    . TYMPANOPLASTY Left   . TYMPANOSTOMY TUBE PLACEMENT     as child-both  . UPPER GASTROINTESTINAL ENDOSCOPY  2003, 2007   done by Dr. Velora Heckler    FAMILY HISTORY: Family History  Problem Relation Age of Onset  . Hypertension Mother   . Stroke Mother   . Heart disease Mother   . Diabetes Mother   . Colon polyps Mother   . Breast cancer Paternal Grandmother   . Kidney cancer Maternal Uncle   . Heart disease Maternal Uncle   . Diabetes Maternal Uncle   . Diabetes Maternal Grandmother   . Heart disease Maternal Grandmother   . Emphysema Paternal Uncle        never smoker  . Emphysema Paternal Grandfather        never smoker  . Colon cancer Paternal Aunt        with met to lung   . Non-Hodgkin's lymphoma Maternal Uncle   . Heart disease Maternal Grandfather     SOCIAL HISTORY:  Social History   Socioeconomic History  . Marital status: Divorced    Spouse name: Not on file  . Number of children: 0  . Years of education: 5  . Highest education level: Not on file  Occupational History  . Occupation: disabled    Fish farm manager: UNEMPLOYED  Tobacco Use  . Smoking status: Never Smoker  . Smokeless tobacco: Never Used  Vaping Use  . Vaping Use: Never used  Substance and Sexual Activity  . Alcohol use: No    Alcohol/week: 0.0 standard drinks  . Drug use: No  . Sexual activity: Not Currently    Partners: Male    Birth control/protection: Surgical  Other Topics Concern  . Not on file  Social History Narrative      Patient is disabled.   Right handed.   Caffeine - Diet  soda., tea   Education - high school   Social Determinants of Health   Financial Resource Strain: Not on file  Food Insecurity: Not on file  Transportation Needs: Not on file  Physical Activity: Not on file  Stress: Not on file  Social Connections: Not on file  Intimate Partner Violence: Not on file     PHYSICAL EXAM   Vitals:   08/26/20 1458  BP: 111/64  Pulse: 67  Weight: 292 lb 8 oz (132.7 kg)   Not recorded    ASSESSMENT AND PLAN  MARJIE CHEA is a 55 y.o. female    History of HIV, under good control, CD4 was 908 within normal limit, Chronic migraine headaches Depression anxiety, Chronic insomnia History of non-Hodgkin's lymphoma, completed with chemo therapy  Her significant daily migraine headaches happened in the scenario of poorly controlled depression anxiety, chronic insomnia,  Zomig as needed, may combine it with tizanidine, Zofran, Aleve, only treat moderate to severe headaches,  For migraine prevention she is on Botox injection, Aimovig 140 mg monthly, increase to higher dose of Effexor xr 150 mg daily  Botox injection for chronic migraine prevention, injection was performed according to [facility] protocol,  5 units of Botox was injected into each side, for 31 injection sites, total of 155 units  Bilateral frontalis 4 injection sites Bilateral temporalis 8 injection sites Bilateral occipitalis  6 injection sites Bilateral cervical paraspinals 4 injection sites Bilateral upper trapezius 6 injection sites  Extra 30 unites were injected into bilateral cervical paraspinal and upper trapezius  Complains of intractable headache for 2- 3 weeks, failed multiple home remedies, will do IV infusion Depacon 1000 mg, with Toradol 30 mg, Compazine 10 mg IV  Marcial Pacas, M.D. Ph.D.  [facility] [place], [place] [address] Phone: [phone] Fax:      [phone]

## 2020-08-28 DIAGNOSIS — R3911 Hesitancy of micturition: Secondary | ICD-10-CM | POA: Diagnosis not present

## 2020-08-28 DIAGNOSIS — R3912 Poor urinary stream: Secondary | ICD-10-CM | POA: Diagnosis not present

## 2020-08-28 DIAGNOSIS — N312 Flaccid neuropathic bladder, not elsewhere classified: Secondary | ICD-10-CM | POA: Diagnosis not present

## 2020-09-14 ENCOUNTER — Other Ambulatory Visit: Payer: Medicaid Other | Admitting: Internal Medicine

## 2020-09-14 DIAGNOSIS — G4700 Insomnia, unspecified: Secondary | ICD-10-CM

## 2020-09-14 NOTE — Telephone Encounter (Signed)
Requesting PA on zolpidem (AMBIEN) 10 MG tablet @  [facility], [place] - [address] Phone:  [phone]  Fax:  [phone]     Please call pt back.

## 2020-09-18 DIAGNOSIS — J209 Acute bronchitis, unspecified: Secondary | ICD-10-CM | POA: Diagnosis not present

## 2020-09-18 DIAGNOSIS — J0100 Acute maxillary sinusitis, unspecified: Secondary | ICD-10-CM | POA: Diagnosis not present

## 2020-09-18 DIAGNOSIS — R059 Cough, unspecified: Secondary | ICD-10-CM | POA: Diagnosis not present

## 2020-09-18 DIAGNOSIS — H7291 Unspecified perforation of tympanic membrane, right ear: Secondary | ICD-10-CM | POA: Diagnosis not present

## 2020-09-21 ENCOUNTER — Telehealth: Payer: Self-pay | Admitting: *Deleted

## 2020-09-21 NOTE — Telephone Encounter (Signed)
Information was called to [facility] for PA for Zolpidem.  Awaiting determination within 24 hours.  Sander Nephew, RN 09/21/2020 2:35 PM.  PA for Zolpidem was approved 09/21/2020 thru 03/24/2021.  Sander Nephew, RN 09/23/2020 8:54 AM.

## 2020-09-22 NOTE — Telephone Encounter (Signed)
Next appt scheduled  11/09/20 with PCP.

## 2020-09-23 ENCOUNTER — Other Ambulatory Visit: Payer: Self-pay | Admitting: Obstetrics

## 2020-09-23 ENCOUNTER — Other Ambulatory Visit: Payer: Self-pay | Admitting: Infectious Disease

## 2020-09-23 ENCOUNTER — Other Ambulatory Visit: Payer: Self-pay | Admitting: Internal Medicine

## 2020-09-23 DIAGNOSIS — N750 Cyst of Bartholin's gland: Secondary | ICD-10-CM

## 2020-09-23 DIAGNOSIS — I10 Essential (primary) hypertension: Secondary | ICD-10-CM

## 2020-09-23 DIAGNOSIS — E118 Type 2 diabetes mellitus with unspecified complications: Secondary | ICD-10-CM

## 2020-09-23 DIAGNOSIS — B373 Candidiasis of vulva and vagina: Secondary | ICD-10-CM

## 2020-09-23 DIAGNOSIS — B3731 Acute candidiasis of vulva and vagina: Secondary | ICD-10-CM

## 2020-09-23 NOTE — Telephone Encounter (Signed)
PA approved for Zolpidem 10 mg .

## 2020-09-23 NOTE — Telephone Encounter (Signed)
Thank you :)

## 2020-10-02 DIAGNOSIS — E7800 Pure hypercholesterolemia, unspecified: Secondary | ICD-10-CM | POA: Diagnosis not present

## 2020-10-02 DIAGNOSIS — B20 Human immunodeficiency virus [HIV] disease: Secondary | ICD-10-CM | POA: Diagnosis not present

## 2020-10-02 DIAGNOSIS — B373 Candidiasis of vulva and vagina: Secondary | ICD-10-CM | POA: Diagnosis not present

## 2020-10-02 DIAGNOSIS — Z7984 Long term (current) use of oral hypoglycemic drugs: Secondary | ICD-10-CM | POA: Diagnosis not present

## 2020-10-02 DIAGNOSIS — E1165 Type 2 diabetes mellitus with hyperglycemia: Secondary | ICD-10-CM | POA: Diagnosis not present

## 2020-10-22 ENCOUNTER — Other Ambulatory Visit: Payer: Self-pay | Admitting: Neurology

## 2020-10-22 ENCOUNTER — Other Ambulatory Visit: Payer: Self-pay

## 2020-10-22 DIAGNOSIS — E118 Type 2 diabetes mellitus with unspecified complications: Secondary | ICD-10-CM

## 2020-10-22 DIAGNOSIS — I10 Essential (primary) hypertension: Secondary | ICD-10-CM

## 2020-10-22 MED ORDER — TRULANCE 3 MG PO TABS: 1.0000 | ORAL_TABLET | Freq: Every day | ORAL | 0 refills | Status: DC

## 2020-10-27 ENCOUNTER — Telehealth: Payer: Self-pay | Admitting: Neurology

## 2020-10-27 NOTE — Telephone Encounter (Signed)
Faxed signed PA request to [facility].

## 2020-10-27 NOTE — Telephone Encounter (Signed)
Patient has a Botox appointment 6/1. I filled out PA form for [facility] and gave to MD to sign.

## 2020-10-28 NOTE — Telephone Encounter (Signed)
Received approval from [facility]. [id number] (10/27/20- 10/27/21).

## 2020-11-09 ENCOUNTER — Encounter: Payer: Medicaid Other | Admitting: Student

## 2020-11-17 NOTE — Telephone Encounter (Signed)
I called [facility] [phone]) and spoke with Larene Beach to check order status. She states Botox TBD 5/19.

## 2020-11-18 ENCOUNTER — Other Ambulatory Visit: Payer: Self-pay

## 2020-11-18 MED ORDER — TRULANCE 3 MG PO TABS: 1.0000 | ORAL_TABLET | Freq: Every day | ORAL | 0 refills | Status: AC

## 2020-11-19 NOTE — Telephone Encounter (Signed)
Received (2) 100 unit vials of Botox today from [facility]. 

## 2020-11-24 ENCOUNTER — Encounter: Payer: Self-pay | Admitting: Student

## 2020-11-24 ENCOUNTER — Other Ambulatory Visit: Payer: Self-pay | Admitting: Internal Medicine

## 2020-11-24 DIAGNOSIS — I10 Essential (primary) hypertension: Secondary | ICD-10-CM

## 2020-11-24 NOTE — Telephone Encounter (Signed)
Last appt 12/26/2019 Message sent to front office to assist with scheduling Refill request sent to appropriate team for review.Despina Hidden Cassady5/24/202211:46 AM

## 2020-12-02 ENCOUNTER — Other Ambulatory Visit: Payer: Self-pay | Admitting: *Deleted

## 2020-12-02 ENCOUNTER — Ambulatory Visit: Payer: Medicaid Other | Admitting: Neurology

## 2020-12-02 ENCOUNTER — Other Ambulatory Visit: Payer: Self-pay

## 2020-12-02 ENCOUNTER — Other Ambulatory Visit: Payer: Self-pay | Admitting: Neurology

## 2020-12-02 ENCOUNTER — Encounter: Payer: Self-pay | Admitting: Neurology

## 2020-12-02 VITALS — BP 120/73 | HR 73 | Ht 66.0 in | Wt 294.0 lb

## 2020-12-02 DIAGNOSIS — E538 Deficiency of other specified B group vitamins: Secondary | ICD-10-CM

## 2020-12-02 DIAGNOSIS — R799 Abnormal finding of blood chemistry, unspecified: Secondary | ICD-10-CM | POA: Diagnosis not present

## 2020-12-02 DIAGNOSIS — G43719 Chronic migraine without aura, intractable, without status migrainosus: Secondary | ICD-10-CM

## 2020-12-02 NOTE — Progress Notes (Signed)
**  Botox 100 units x 2 vials, NDC 6943-7005-25, Lot [id number], Exp 11/2022, specialty pharmacy.//mck,rn**

## 2020-12-02 NOTE — Progress Notes (Signed)
PATIENT: [name] DOB: 1966/02/24  Chief Complaint  Patient presents with  . Procedure    Botox     HISTORICAL  [name], Is accompanied by her sister returned for Botox injection as migraine prevention  She has past medical history of HIV, hyperlipidemia, migraines, depression, and non-Hodgkin lymphoma in 2005 with chemotherapy and radiation.   She has history of migraines since 19. In the last 10 years, she reports having daily migraines. She is currently taking Topamax and Zomig with relief. She takes approximately 20-25 tablets of her Zomig per month.    She has aura's that look like floating dirt particles, flashing lights, and she sees Christmas tree lights when they are severe, she has nausea, vomiting and sometimes diarrhea when they're severe. She also complains of diplopia, blurry vision, and  photophobia. She also has had a lightning bolt sensation throughout her body.  She describes them as pulsating and throbbing  on a scale of 5-6/10 which she considers mild, when they are severe they can go as high as 10/10. They usually start in the right temporal area, but will also start on the left temporal and are worse.   Any type of smoke or MSG can precipitate a migraine. They generally last 2-9 days. She has been to the emergency department twice in the last year for migraines. She states "I just can't do this anymore I need to do something about her migraines"  She has tried and failed different preventive medications:  atenolol, verapamil, nortriptyline, Inderal, Depakote without benefit. Atenolol and verapamil caused hypotension. Depakote caused severe vomiting. Topamax causing her worsening headaches, will show tried Zonegran, Topamax ER, she denies significant improvement.  For abortive treatment, she has tried Imitrex, Maxalt with suboptimal response, Zomig as needed since to work best,  she also takes transderm scophalamine patch for nause  I started to give her  BOTOX injection since May 2013, every 3 months, she responded very well,   Previous MRI brain was normal in 2013.  Last Botox injection as migraine prevention was March 2017, she responded very well, but in recent few weeks, she noticed increased migraine headaches, this baby had headache left retro-orbital area severe pounding headache with associated light noise sensitivity, has been ongoing for 3 days, failed to respond to Zomig by mouth, Imitrex injection as needed, she has constant dry heaves, difficulty opening her eyes, In addition, today she complains of restless leg symptoms, difficult to stand or lying still for extended period of time, has tried Neurontin-cause nausea, confusion,  Lyrica-cause excessive weight gain, she has never tried dopamine agonist in the past  I reviewed laboratory in April 2017, normal hemoglobin 12 point 9, mild elevated RDW 15.8, decreased MCV  Update April 27 2016  Last Botox injection was in June 2017, she had left knee arthroscopic surgery in August 2017, complaining of significant joints pain, unsteady gait, before Botox she was having moderate to severe headache daily, even Botox now, she used to 12 tablets of Zomig every months because of frequent moderate to severe headache  Update August 10 2016: Last Botox injection was in October 2017, has been very helpful, but over the past 3-4 weeks, she had frequent almost daily headaches, oftentimes wake her up from sleep, Zomig has been very helpful,  Low dose Mirapex 0.25 milligrams 2 tablets every night has been very helpful for her restless leg symptoms, she reported 80% improvement, laboratory reviewed in January 2018, CD4 count 760, normal hemoglobin  12.3, mildly decreased MCV, increased RDW,  Update Nov 09 2016: She suffered bilateral ear infection, strep throat since last visit, had frequent headaches, nausea,  Update February 22 2017: She responded well to previous injection, complains of a lot of  stress at home  UPDATE May 31 2017: She complains suboptimal response to Botox injection, had daily headache over last week, also complains of a lot of stress at home, average more than 4 headaches in 1 week, lasting all day, with associated nausea vomiting, is taking Zomig as needed, with partial response  UPDATE September 08 2017: She complains of excessive stress, frequent migraine headaches,  UPDATE December 20 2017: She is accompanied by her sister at today's clinical visit, Ajovy injection has helped her migraine, she rated as 25%, but with the combination of Ajovy and Botox injection, she still has 3 migraine headaches each week, has been dealing with daily headaches over the past 1 week, required multiple dose of Zomig treatment  UPDATE Sept 25 2019: She responded well to previous injection, today she complains of 2 weeks history of severe headaches failed home remedy treatment, multiple dose of Zomig,  She is also getting Ajovy injection every month, which has been helpful,  UPDATE Jun 21 2018: she overall responding well to Botox, Ajovy, also taking amitriptyline 25 mg 2 tablets every night, Zomig as needed for abortive treatment, couple headaches each week, usually responding well to triptan treatment, but this particular headache been ongoing for 2 weeks, tried and failed multiple home medications.  UPDATE September 20 2018: She responded well to previous injection, but at the end of injection cycle,She would have more frequent prolonged headaches, she used Zomig as needed,  UPDATE December 19 2018: She responded very well to previous injection.  UPDATE Apr 17 2019: She is with her sister at today's visit, she reported a lot of stress at home, has increased frequency of migraines, almost daily basis over the past few weeks, she also suffered depression anxiety  UPDATE Aug 07 2019: She continues to have frequent headaches at home.  Lamotrigine and Elavil were not helpful, she has stopped taking  them, Botox injection along with aimovig were very helpful.  UPDATE Nov 06 2019: She return for Botox injection as migraine prevention, previous nerve block in combination with Depakote on October 31, 2019 has helped her headache  UPDATE January 15 2020: This is an earlier than expected visit because of one month history of daily severe headache for 1 month, 8 out of 10, holoacranial, light noise sensitivity, nauseous, frequent vomiting, she use zomig 12m 12 tablets each month, she also tried multiple over-the-counter aspirin, tizanidine 4 mg twice a day  She is on polypharmacy for HTN, HLD, DM, urinary retention, taking flomax, planning on cath twice a week,  See urologist Dr. JIrine Seal   She also complains of depression, has not seen pyschiatrist for long time, previous tried zoloft, Cymbalta, wellbutrin, paxil, did not help, complains of unbearable side effect.  Chronic insomnia, ambien 129m she can only sleep 4 to 5 hours maximum,  I have personally reviewed MRI brain wo in 2013 was normal.   Laboratory evaluation in 2020, CD4 cell was 908, 35% helper cell, within normal limit.  Low normal B12 292, normal CBC, A1c was 7.7 Update office February 12 2020 Her headache has improved with adding Effexor XR 37.5 mg daily, tolerating the medicine well, return for Botox injection as migraine prevention today, also Aimovig 140 mg monthly, she complains of diffuse  body achy pain  Update May 27, 2020 She complains of worsening migraine headaches, despite higher dose of Effexor XR 75 mg daily, also complains of worsening depression, hope to take higher dose of Effexor,  Update August 26, 2020: She complains of severe upper neck, occipital area pressure headache over the past 2 to 3 weeks, constant, failed to improve with multiple dose of home medications, overall still responsive to Botox injection per patient, she is also on aimovig  UPDATE December 02 2020: She continues to have frequent headaches,  also concerned about B12 deficiency, reported that Botox injection continue to help her headache,  REVIEW OF SYSTEMS: Full 14 system review of systems performed and notable only for as above  ALLERGIES: Allergies  Allergen Reactions  . Divalproex Sodium Nausea And Vomiting    Causes light-headedness  . Gabapentin Nausea And Vomiting    confusion  . Ondansetron Hcl Nausea And Vomiting    Triggers migraines, "makes me vomit"  . Pineapple   . Propoxyphene N-Acetaminophen Nausea And Vomiting  . Simvastatin     PATIENT CANNOT BE PRESCRIBED THIS STATIN WHILE RECEIVING NORVIR  . Zocor [Simvastatin - High Dose]   . Penicillins     REACTION: lips swollen to point of bleeding, sever vaginal irritation  Reports tolerating augmentin well without any complaints or reaction    HOME MEDICATIONS: Current Outpatient Medications  Medication Sig Dispense Refill  . AIMOVIG 140 MG/ML SOAJ INJECT 1 AUTO-INJECTOR INTO THE SKIN EVERY 30 DAYS AS DIRECTED 1 mL 11  . albuterol (PROAIR HFA) 108 (90 Base) MCG/ACT inhaler USE 1 TO 2 PUFFS EVERY 4-6 HOURS AS NEEDED FOR SHORTNESS OF BREATH ORWHEEZING 8.5 g 1  . amoxicillin-clavulanate (AUGMENTIN) 875-125 MG tablet Take 1 tablet by mouth 2 (two) times daily. 28 tablet 0  . Beclomethasone Dipropionate 80 MCG/ACT AERS Place 2 Inhalers into the nose daily. 8.7 g 10  . blood glucose meter kit and supplies Dispense based on patient and insurance preference. Use up to four times daily as directed. (FOR ICD-9 250.00, 250.01). 1 each 0  . botulinum toxin Type A (BOTOX) 100 units SOLR injection FOR OFFICE ADMINISTRATION. PHYSICIAN TO INJECT UP TO 200 UNITS INTRAMUSCULARLY INTO HEAD, FACE AND NECK AREA EVERY 90 DAYS. 2 each 3  . Calcium Carbonate-Vitamin D (CALCIUM-VITAMIN D) 500-200 MG-UNIT per tablet Take 2 tablets by mouth daily.     . diclofenac Sodium (VOLTAREN) 1 % GEL APPLY TOPICALLY TO AFFECTED AREA 4 GRAMS4 TIMES DAILY 100 g 11  . FARXIGA 10 MG TABS tablet TAKE 1  TABLET BY MOUTH EVERY DAY 90 tablet 3  . fluconazole (DIFLUCAN) 100 MG tablet TAKE 1 TABLET BY MOUTH EVERY DAY 14 tablet 3  . glucose blood test strip Use as instructed 100 each 12  . ibuprofen (ADVIL) 800 MG tablet TAKE 1 TABLET BY MOUTH EVERY 8 HOURS AS NEEDED 30 tablet 5  . liraglutide (VICTOZA) 18 MG/3ML SOPN Inject 0.6 mg daily for 1 week, then increase to 1.2 mg daily 3 pen 0  . lisinopril (ZESTRIL) 20 MG tablet TAKE 1 TABLET BY MOUTH EVERY DAY 90 tablet 1  . metroNIDAZOLE (METROGEL) 0.75 % gel Apply 1 application topically 2 (two) times daily.    . mometasone-formoterol (DULERA) 100-5 MCG/ACT AERO USE 2 PUFFS BY MOUTH THE FIRST THING IN THE MORNING AND THEN ANOTHER 2 PUFFS ABOUT 12 HOURS LATER 13 g 5  . pantoprazole (PROTONIX) 40 MG tablet TAKE 1 TABLET BY MOUTH 2 TIMES A DAY FORACID  REFLUX 60 tablet 4  . Plecanatide (TRULANCE) 3 MG TABS Take 1 tablet by mouth daily. 30 tablet 0  . pravastatin (PRAVACHOL) 40 MG tablet TAKE 1 TABLET BY MOUTH EVERY DAY FOR CHOLESTEROL 90 tablet 1  . promethazine (PHENERGAN) 25 MG suppository PLACE 1 SUPPOSITORY RECTALLY EVERY 6 HOURS AS NEEDED FOR NAUSEA OR VOMITING 12 each 11  . propranolol (INDERAL) 20 MG tablet TAKE 1 TABLET BY MOUTH 2 TIMES A DAY 180 tablet 3  . SYMTUZA 800-150-200-10 MG TABS TAKE 1 TABLET BY MOUTH EVERY DAY WITH BREAKFAST 30 tablet 5  . Tamsulosin HCl (FLOMAX) 0.4 MG CAPS Take 0.4 mg by mouth daily. 1-2 daily    . terconazole (TERAZOL 7) 0.4 % vaginal cream PLACE 1 APPLICATORFUL VAGINALLY AT BEDTIME 45 g 3  . tiZANidine (ZANAFLEX) 4 MG tablet Take 1 tablet (4 mg total) by mouth every 8 (eight) hours as needed for muscle spasms. 90 tablet 11  . venlafaxine XR (EFFEXOR XR) 150 MG 24 hr capsule Take 1 capsule (150 mg total) by mouth daily with breakfast. 90 capsule 4  . zolmitriptan (ZOMIG) 5 MG tablet Take 1 tab at onset of migraine.  May repeat, with 1 tab, in 2 hrs, if needed.  Max dose of 2 tabs/24hrs.  Must last 30 days. #12/30 12  tablet 11  . zolpidem (AMBIEN) 10 MG tablet TAKE 1 TABLET BY MOUTH AT BEDTIME AS NEEDED FOR SLEEP 30 tablet 5   No current facility-administered medications for this visit.    PAST MEDICAL HISTORY: Past Medical History:  Diagnosis Date  . Acute gastritis without mention of hemorrhage 01/01/2002  . Anemia 10/2009   macrocytic anemia with baseline MCV 104-106  . Anxiety   . Asthma   . Boil 01/05/2017  . Cellulitis 05/20/2020  . 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 Complex tear of medial meniscus of left knee as current injury 02/25/2016  . Complication of anesthesia    hard to wake up  . COPD (chronic obstructive pulmonary disease) (HCC)    chronic bronchitis  . Derangement of anterior horn of lateral meniscus of left knee 02/25/2016  . Duodenitis without mention of hemorrhage 01/01/2002  . DVT (deep venous thrombosis) (HCC)    history clot lt groin when she had lymphoma-in remision now.  . Elevated triglycerides with high cholesterol 01/05/2017  . Gastroparesis   . GERD (gastroesophageal reflux disease)   . Hiatal hernia 01/01/2002  . HIV infection ([place])    CD4 = 570 (05/2010), VL undetectation  . Hyperlipidemia   . IBS (irritable bowel syndrome)   . Migraines    on topamax and triptans, frequent (almost daily) attacks   . MRSA (methicillin resistant staph aureus) culture positive 12/20/2010  . Non Hodgkin's lymphoma ([place])    stage II, s/p resection, chemotherapy, radiotherapy, Dr. Beryle Beams is her oncologist.   . PUD (peptic ulcer disease) 07/1997   per EGD report 07/1997 with history of esophagitis  . Restless leg syndrome 12/24/2015  . Shingles    in lumbar dermatome  . Spondylolisthesis of lumbar region    L5-S1  . TMJ (dislocation of temporomandibular joint)   . Traumatic tear of lateral meniscus of right knee 04/20/2012  . Vaginal yeast infection 09/25/2017    PAST SURGICAL HISTORY: Past Surgical History:  Procedure Laterality Date  . CHOLECYSTECTOMY    .  ENDOMETRIAL ABLATION    . KNEE ARTHROSCOPY Right 04/20/12  . KNEE ARTHROSCOPY WITH LATERAL MENISECTOMY Left 02/25/2016   Procedure: LEFT  KNEE ARTHROSCOPY WITH MEDIAL AND  LATERAL MENISECTOMIES;  Surgeon: Marchia Bond, MD;  Location: [place];  Service: Orthopedics;  Laterality: Left;  . KNEE ARTHROSCOPY WITH MEDIAL MENISECTOMY Left 02/25/2016   Procedure: KNEE ARTHROSCOPY WITH MEDIAL MENISECTOMY;  Surgeon: Marchia Bond, MD;  Location: [place];  Service: Orthopedics;  Laterality: Left;  . TONSILLECTOMY    . TYMPANOPLASTY Left   . TYMPANOSTOMY TUBE PLACEMENT     as child-both  . UPPER GASTROINTESTINAL ENDOSCOPY  2003, 2007   done by Dr. Velora Heckler    FAMILY HISTORY: Family History  Problem Relation Age of Onset  . Hypertension Mother   . Stroke Mother   . Heart disease Mother   . Diabetes Mother   . Colon polyps Mother   . Breast cancer Paternal Grandmother   . Kidney cancer Maternal Uncle   . Heart disease Maternal Uncle   . Diabetes Maternal Uncle   . Diabetes Maternal Grandmother   . Heart disease Maternal Grandmother   . Emphysema Paternal Uncle        never smoker  . Emphysema Paternal Grandfather        never smoker  . Colon cancer Paternal Aunt        with met to lung   . Non-Hodgkin's lymphoma Maternal Uncle   . Heart disease Maternal Grandfather     SOCIAL HISTORY:  Social History   Socioeconomic History  . Marital status: Divorced    Spouse name: Not on file  . Number of children: 0  . Years of education: 79  . Highest education level: Not on file  Occupational History  . Occupation: disabled    Fish farm manager: UNEMPLOYED  Tobacco Use  . Smoking status: Never Smoker  . Smokeless tobacco: Never Used  Vaping Use  . Vaping Use: Never used  Substance and Sexual Activity  . Alcohol use: No    Alcohol/week: 0.0 standard drinks  . Drug use: No  . Sexual activity: Not Currently    Partners: Male    Birth control/protection:  Surgical  Other Topics Concern  . Not on file  Social History Narrative      Patient is disabled.   Right handed.   Caffeine - Diet soda., tea   Education - high school   Social Determinants of Health   Financial Resource Strain: Not on file  Food Insecurity: Not on file  Transportation Needs: Not on file  Physical Activity: Not on file  Stress: Not on file  Social Connections: Not on file  Intimate Partner Violence: Not on file     PHYSICAL EXAM   Vitals:   12/02/20 1331  BP: 120/73  Pulse: 73  Weight: 294 lb (133.4 kg)  Height: 5' 6" (1.676 m)   Not recorded    ASSESSMENT AND PLAN  [name] is a 55 y.o. female    History of HIV, under good control, CD4 was within normal limit, Chronic migraine headaches Depression anxiety, Chronic insomnia History of non-Hodgkin's lymphoma, completed with chemo therapy  Her significant daily migraine headaches happened in the scenario of poorly controlled depression anxiety, chronic insomnia,  Zomig as needed, may combine it with tizanidine, Zofran, Aleve, only treat moderate to severe headaches,  For migraine prevention she is on Botox injection, Aimovig 140 mg monthly, increase to higher dose of Effexor xr 150 mg daily  Botox injection for chronic migraine prevention, injection was performed according to [place] protocol,  5 units of Botox was injected  into each side, for 31 injection sites, total of 155 units  Bilateral frontalis 4 injection sites Bilateral temporalis 8 injection sites Bilateral occipitalis 6 injection sites Bilateral cervical paraspinals 4 injection sites Bilateral upper trapezius 6 injection sites  Extra 60 unites were injected into bilateral cervical paraspinal and upper trapezius  Marcial Pacas, M.D. Ph.D.  [facility] [place], [place] [address] Phone: [phone] Fax:      [phone]

## 2020-12-03 ENCOUNTER — Telehealth: Payer: Self-pay | Admitting: Neurology

## 2020-12-03 LAB — VITAMIN B12: Vitamin B-12: 328 pg/mL (ref 232–1245)

## 2020-12-03 NOTE — Telephone Encounter (Signed)
Please call patient inform low normal range B12, she should start over-the-counter B12 supplement, 1000 mcg daily

## 2020-12-03 NOTE — Telephone Encounter (Signed)
Left patient a detailed message, with results and recommendations, on voicemail (ok per DPR).  Provided our number to call back with any questions.

## 2020-12-16 ENCOUNTER — Other Ambulatory Visit: Payer: Self-pay | Admitting: Infectious Disease

## 2021-01-07 ENCOUNTER — Telehealth: Payer: Self-pay | Admitting: *Deleted

## 2021-01-07 NOTE — Telephone Encounter (Signed)
PA for Aimovig 140mg  started on covermymeds (key: [id number])). Pt has a Medicaid plan though [employer] (ZM#[id number] S). [id number]. Decision pending.

## 2021-01-07 NOTE — Telephone Encounter (Signed)
[id number] approved through 01/07/2022.

## 2021-01-11 ENCOUNTER — Other Ambulatory Visit: Payer: Self-pay | Admitting: Neurology

## 2021-01-12 ENCOUNTER — Other Ambulatory Visit: Payer: Self-pay | Admitting: Student

## 2021-01-12 ENCOUNTER — Other Ambulatory Visit: Payer: Self-pay | Admitting: Infectious Disease

## 2021-01-18 ENCOUNTER — Other Ambulatory Visit: Payer: Self-pay | Admitting: Student

## 2021-01-18 DIAGNOSIS — E118 Type 2 diabetes mellitus with unspecified complications: Secondary | ICD-10-CM

## 2021-01-28 ENCOUNTER — Other Ambulatory Visit: Payer: Self-pay | Admitting: Neurology

## 2021-02-15 ENCOUNTER — Other Ambulatory Visit: Payer: Self-pay | Admitting: Obstetrics

## 2021-02-15 DIAGNOSIS — Z1231 Encounter for screening mammogram for malignant neoplasm of breast: Secondary | ICD-10-CM

## 2021-02-16 ENCOUNTER — Telehealth: Payer: Self-pay | Admitting: Neurology

## 2021-02-16 NOTE — Telephone Encounter (Signed)
Pt called states pharmacy needs a PA for zolmitriptan (ZOMIG) 5 MG tablet.

## 2021-02-17 NOTE — Telephone Encounter (Signed)
PA for zolmitriptan '5mg'$  started on covermymeds (key: [id number]). Pt has a Medicaid plan though [employer] [id number] S). [id number]. Decision pending.

## 2021-02-17 NOTE — Telephone Encounter (Signed)
[id number] approved through 02/17/2022.

## 2021-02-22 ENCOUNTER — Encounter: Payer: Medicaid Other | Admitting: Internal Medicine

## 2021-03-01 ENCOUNTER — Ambulatory Visit: Payer: Medicaid Other | Admitting: Internal Medicine

## 2021-03-01 ENCOUNTER — Encounter: Payer: Self-pay | Admitting: Internal Medicine

## 2021-03-01 ENCOUNTER — Other Ambulatory Visit: Payer: Self-pay

## 2021-03-01 VITALS — BP 143/77 | HR 77 | Temp 98.1°F | Ht 66.0 in | Wt 294.0 lb

## 2021-03-01 DIAGNOSIS — Z0000 Encounter for general adult medical examination without abnormal findings: Secondary | ICD-10-CM

## 2021-03-01 DIAGNOSIS — G4700 Insomnia, unspecified: Secondary | ICD-10-CM

## 2021-03-01 DIAGNOSIS — I10 Essential (primary) hypertension: Secondary | ICD-10-CM

## 2021-03-01 DIAGNOSIS — E785 Hyperlipidemia, unspecified: Secondary | ICD-10-CM | POA: Diagnosis not present

## 2021-03-01 DIAGNOSIS — E782 Mixed hyperlipidemia: Secondary | ICD-10-CM

## 2021-03-01 DIAGNOSIS — M4316 Spondylolisthesis, lumbar region: Secondary | ICD-10-CM

## 2021-03-01 DIAGNOSIS — R251 Tremor, unspecified: Secondary | ICD-10-CM | POA: Diagnosis not present

## 2021-03-01 DIAGNOSIS — E119 Type 2 diabetes mellitus without complications: Secondary | ICD-10-CM

## 2021-03-01 DIAGNOSIS — E538 Deficiency of other specified B group vitamins: Secondary | ICD-10-CM

## 2021-03-01 DIAGNOSIS — F3341 Major depressive disorder, recurrent, in partial remission: Secondary | ICD-10-CM

## 2021-03-01 DIAGNOSIS — R61 Generalized hyperhidrosis: Secondary | ICD-10-CM

## 2021-03-01 DIAGNOSIS — E118 Type 2 diabetes mellitus with unspecified complications: Secondary | ICD-10-CM

## 2021-03-01 LAB — GLUCOSE, CAPILLARY: Glucose-Capillary: 277 mg/dL — ABNORMAL HIGH (ref 70–99)

## 2021-03-01 LAB — POCT GLYCOSYLATED HEMOGLOBIN (HGB A1C): Hemoglobin A1C: 9.8 % — AB (ref 4.0–5.6)

## 2021-03-01 MED ORDER — DICLOFENAC SODIUM 1 % EX GEL: CUTANEOUS | 11 refills | Status: DC

## 2021-03-01 MED ORDER — PRAVASTATIN SODIUM 40 MG PO TABS: ORAL_TABLET | ORAL | 1 refills | Status: DC

## 2021-03-01 MED ORDER — ZOLPIDEM TARTRATE 10 MG PO TABS: ORAL_TABLET | ORAL | 5 refills | Status: DC

## 2021-03-01 MED ORDER — FLUOXETINE HCL 20 MG PO CAPS: 20.0000 mg | ORAL_CAPSULE | Freq: Every day | ORAL | 2 refills | Status: DC

## 2021-03-01 MED ORDER — DICLOFENAC SODIUM 1 % EX GEL: 4.0000 g | Freq: Four times a day (QID) | CUTANEOUS | Status: DC

## 2021-03-01 NOTE — Assessment & Plan Note (Signed)
>>  ASSESSMENT AND PLAN FOR TYPE 2 DIABETES MELLITUS (HCC) WRITTEN ON 03/01/2021  3:45 PM BY REHMAN, AREEG N, DO  Currently on insulin, Farxiga and Victoza.  Hemoglobin A1c 9.8 today, up from 7.7 a year ago.  Patient is scheduled to follow-up with her endocrinologist tomorrow.

## 2021-03-01 NOTE — Assessment & Plan Note (Addendum)
Currently on insulin, Farxiga and Victoza.  Hemoglobin A1c 9.8 today, up from 7.7 a year ago.  Patient is scheduled to follow-up with her endocrinologist tomorrow.

## 2021-03-01 NOTE — Assessment & Plan Note (Signed)
PHQ 9 is 19 today.  Patient denies any suicidal thoughts or ideations.  She was recently only taking Effexor 150 mg however states that this has not been helping her depression.  She stopped this medication several weeks ago.  She has been on multiple medications in the past including Cymbalta, Prozac, Wellbutrin, and BuSpar.  She is interested in trying Prozac again.  She states that she has lost many family members and friends over the past 2 years which has worsened her depression.  Plan: - Start Prozac 20 mg daily - Follow-up in 4 to 6 weeks via telehealth to assess depression symptoms -May need referral to psychiatry if she fails on Prozac again

## 2021-03-01 NOTE — Assessment & Plan Note (Addendum)
>>  ASSESSMENT AND PLAN FOR DYSLIPIDEMIA WRITTEN ON 03/02/2021  2:57 PM BY Kristeen Lantz N, DO  Currently on pravastatin  40 mg daily.  Will recheck a lipid panel today.   ADDENDUM: Critical lab results of triglycerides 717.  Total cholesterol 350 and LDL 161.  ASCVD shows a 10-year risk greater than 7.5% (13.3/%).  Recommend switching pravastatin  40 mg to high intensity rosuvastatin  20 mg daily.   >>ASSESSMENT AND PLAN FOR ELEVATED TRIGLYCERIDES WITH HIGH CHOLESTEROL WRITTEN ON 03/02/2021  2:57 PM BY Cattaleya Wien N, DO  ADDENDUM- Lipid Panel     Component Value Date/Time   CHOL 350 (H) 03/01/2021 1538   TRIG 717 (HH) 03/01/2021 1538   HDL 43 03/01/2021 1538   CHOLHDL 8.1 (H) 03/01/2021 1538   CHOLHDL 7.2 (H) 05/07/2020 1206   VLDL NOT CALC 12/21/2016 1419   LDLCALC 161 (H) 03/01/2021 1538   LDLCALC  05/07/2020 1206     Comment:     . LDL cholesterol not calculated. Triglyceride levels greater than 400 mg/dL invalidate calculated LDL results. . Reference range: <100 . Desirable range <100 mg/dL for primary prevention;   <70 mg/dL for patients with CHD or diabetic patients  with > or = 2 CHD risk factors. Aaron Aas LDL-C is now calculated using the Martin-Hopkins  calculation, which is a validated novel method providing  better accuracy than the Friedewald equation in the  estimation of LDL-C.  Melinda Sprawls et al. Erroll Heard. 4098;119(14): 2061-2068  (http://education.[facility].com/faq/FAQ164)    LDLDIRECT 131 (H) 11/05/2013 1427   LABVLDL 146 (H) 03/01/2021 1538   Critical lab results of triglycerides 717.  Total cholesterol 350 and LDL 161.  ASCVD shows a 10-year risk greater than 7.5% (13.3/%).  Recommend switching pravastatin  40 mg to high intensity rosuvastatin  20 mg daily.

## 2021-03-01 NOTE — Assessment & Plan Note (Signed)
History of chronic insomnia.  She uses Ambien 10 mg.  Refilled today.

## 2021-03-01 NOTE — Assessment & Plan Note (Signed)
Patient reports 65-monthhistory of excessive sweating and what feels like hot flashes to her.  She states that she sweats all throughout the night and this continued throughout the day as well.  She states that she had an endometrial ablation many years ago and did not have menstruation after this.  Denies any fevers or weight loss.  Plan: Possible vasomotor symptoms versus heat intolerance due to current weather this summer.  We will continue to monitor.  She is also restarting Prozac which may help.

## 2021-03-01 NOTE — Assessment & Plan Note (Signed)
Patient has been taking oral vitamin B-12 supplement 1000 mcg daily.  Wants her vitamin B12 rechecked today.

## 2021-03-01 NOTE — Patient Instructions (Signed)
For your tremors, I want you to follow-up with neurology regarding this.  I have sent in a refill on her Ambien.  I have also sent in a prescription for Prozac 20 mg once daily.  We will follow-up with a telehealth visit in 4 to 6 weeks for this.  Come back to see [place] in 3 months!

## 2021-03-01 NOTE — Progress Notes (Signed)
   CC: DM, tremor, depression, medication refill  HPI:  [name] is a 55 y.o. female with past medical history listed below presenting for medication refills, depression, tremor and diabetes. For details of today's visit and the status of his chronic medical issues please refer to the assessment and plan.   Past Medical History:  Diagnosis Date   Acute gastritis without mention of hemorrhage 01/01/2002   Anemia 10/2009   macrocytic anemia with baseline MCV 104-106   Anxiety    Asthma    Boil 01/05/2017   Cellulitis 05/20/2020   Chondromalacia of right knee 04/20/2012   Complex tear of medial meniscus of left knee as current injury [id number]   Complication of anesthesia    hard to wake up   COPD (chronic obstructive pulmonary disease) (HCC)    chronic bronchitis   Derangement of anterior horn of lateral meniscus of left knee 02/25/2016   Duodenitis without mention of hemorrhage 01/01/2002   DVT (deep venous thrombosis) (HCC)    history clot lt groin when she had lymphoma-in remision now.   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 Beryle Beams is her oncologist.    PUD (peptic ulcer disease) 07/1997   per EGD report 07/1997 with history of esophagitis   Restless leg syndrome 12/24/2015   Shingles    in lumbar dermatome   Spondylolisthesis of lumbar region    L5-S1   TMJ (dislocation of temporomandibular joint)    Traumatic tear of lateral meniscus of right knee 04/20/2012   Vaginal yeast infection 09/25/2017   Review of Systems:  Negative except as per assessment and plan  Physical Exam:  Vitals:   03/01/21 1421 03/01/21 1429  BP:  (!) 143/77  Pulse:  77  Temp:  98.1 F (36.7 C)  TempSrc:  Oral  SpO2:  95%  Weight: 294 lb 9.6 oz (133.6 kg) 294 lb (133.4 kg)  Height:  '5\' 6"'$  (1.676 m)   Physical Exam General: alert, appears stated age, in no acute distress HEENT: Normocephalic, atraumatic, EOM intact, conjunctiva normal CV: Regular rate and rhythm, no murmurs rubs or gallops Pulm: Clear to auscultation bilaterally, normal work of breathing Abdomen: Soft, nondistended, bowel sounds present, no tenderness to palpation MSK: No lower extremity edema Skin: Warm and dry Neuro: Alert and oriented x3   Assessment & Plan:   See Encounters Tab for problem based charting.  Patient discussed with Dr. Dareen Piano

## 2021-03-01 NOTE — Assessment & Plan Note (Signed)
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  However she also describes a twitching tremor-like movement of her bilateral upper and lower extremities.  She has not noticed any inciting factors.  She states that this started around the time of her father's hospitalization 3 years ago.  Her symptoms are certainly worse in times of stress.  She states that happens about once a month and this has progressed over the past few years.  States her sister has a similar presentation and has been worked up with blood work and imaging all which has been unyielding.  Plan: Unclear etiology, recommend that she follow-up with her neurologist

## 2021-03-01 NOTE — Assessment & Plan Note (Signed)
Blood pressure slightly above goal today.  Patient is currently on lisinopril 20 mg.  We will recheck a BMP today and monitor blood pressure at follow-up visit.

## 2021-03-01 NOTE — Assessment & Plan Note (Signed)
Patient follows with a GI doctor and is planning for colonoscopy later this year.  Referred to ophthalmology for an eye exam.

## 2021-03-02 MED ORDER — ROSUVASTATIN CALCIUM 20 MG PO TABS: 20.0000 mg | ORAL_TABLET | Freq: Every day | ORAL | 1 refills | Status: DC

## 2021-03-02 NOTE — Assessment & Plan Note (Addendum)
ADDENDUM- Lipid Panel     Component Value Date/Time   CHOL 350 (H) 03/01/2021 1538   TRIG 717 (HH) 03/01/2021 1538   HDL 43 03/01/2021 1538   CHOLHDL 8.1 (H) 03/01/2021 1538   CHOLHDL 7.2 (H) 05/07/2020 1206   VLDL NOT CALC 12/21/2016 1419   LDLCALC 161 (H) 03/01/2021 1538   LDLCALC  05/07/2020 1206     Comment:     . LDL cholesterol not calculated. Triglyceride levels greater than 400 mg/dL invalidate calculated LDL results. . Reference range: <100 . Desirable range <100 mg/dL for primary prevention;   <70 mg/dL for patients with CHD or diabetic patients  with > or = 2 CHD risk factors. Marland Kitchen LDL-C is now calculated using the Martin-Hopkins  calculation, which is a validated novel method providing  better accuracy than the Friedewald equation in the  estimation of LDL-C.  Cresenciano Genre et al. Annamaria Helling. [id number]): 2061-2068  (http://education.[facility].com/faq/FAQ164)    LDLDIRECT 131 (H) 11/05/2013 1427   LABVLDL 146 (H) 03/01/2021 1538   Critical lab results of triglycerides 717.  Total cholesterol 350 and LDL 161.  ASCVD shows a 10-year risk greater than 7.5% (13.3/%).  Recommend switching pravastatin 40 mg to high intensity rosuvastatin 20 mg daily.

## 2021-03-02 NOTE — Addendum Note (Signed)
Addended by: Mike Craze on: 03/02/2021 02:58 PM   Modules accepted: Orders

## 2021-03-03 LAB — LIPID PANEL
Chol/HDL Ratio: 8.1 ratio — ABNORMAL HIGH (ref 0.0–4.4)
Cholesterol, Total: 350 mg/dL — ABNORMAL HIGH (ref 100–199)
HDL: 43 mg/dL (ref >39)
LDL Chol Calc (NIH): 161 mg/dL — ABNORMAL HIGH (ref 0–99)
Triglycerides: 717 mg/dL (ref 0–149)
VLDL Cholesterol Cal: 146 mg/dL — ABNORMAL HIGH (ref 5–40)

## 2021-03-03 LAB — BMP8+ANION GAP
Anion Gap: 17 mmol/L (ref 10.0–18.0)
BUN/Creatinine Ratio: 21 (ref 9–23)
BUN: 18 mg/dL (ref 6–24)
CO2: 22 mmol/L (ref 20–29)
Calcium: 9.9 mg/dL (ref 8.7–10.2)
Chloride: 97 mmol/L (ref 96–106)
Creatinine, Ser: 0.86 mg/dL (ref 0.57–1.00)
Glucose: 234 mg/dL — ABNORMAL HIGH (ref 65–99)
Potassium: 4.6 mmol/L (ref 3.5–5.2)
Sodium: 136 mmol/L (ref 134–144)
eGFR: 80 mL/min/{1.73_m2} (ref >59)

## 2021-03-03 LAB — VITAMIN B12: Vitamin B-12: 271 pg/mL (ref 232–1245)

## 2021-03-04 ENCOUNTER — Telehealth: Payer: Self-pay

## 2021-03-04 NOTE — Telephone Encounter (Signed)
Pt is requesting a call back she stated that Dr Laural Golden told her to call back for her Lab results

## 2021-03-09 ENCOUNTER — Telehealth: Payer: Self-pay | Admitting: Neurology

## 2021-03-09 NOTE — Telephone Encounter (Signed)
[id number] approved through 02/17/2022.       PA for zolmitriptan '5mg'$  started on covermymeds (key: [id number]). Pt has a Medicaid plan though [employer] [id number] S). [id number]. Decision pending.    I called and spoke to [place] at [place]. He was provided with the approval information. They will contact her plan to see what else is needed to process the prescription. Sometimes, a particular NDC code just needs an override.  I also called the patient back and left this information on her voicemail. Provided our number to call back with any additional concerns.

## 2021-03-09 NOTE — Telephone Encounter (Signed)
Pt having issues with her PA for the zolmitriptan (ZOMIG) 5 MG tablet. Pt requesting a call back.

## 2021-03-09 NOTE — Telephone Encounter (Signed)
Matt called back from [facility]. Apparently, the current PA on file is for the  disintegrating tablets. The patient takes the regular tablets.  I called [facility] (she has a Conservation officer, historic buildings). [phone]. Updated PA completed for the regular tablets and approved through 03/09/2022. [id number].  I called [facility] again and spoke to [place]. She will provide this update to the pharmacist.

## 2021-03-10 ENCOUNTER — Encounter: Payer: Self-pay | Admitting: *Deleted

## 2021-03-10 ENCOUNTER — Ambulatory Visit: Payer: Medicaid Other | Admitting: Neurology

## 2021-03-10 ENCOUNTER — Encounter: Payer: Self-pay | Admitting: Neurology

## 2021-03-10 ENCOUNTER — Telehealth: Payer: Self-pay | Admitting: *Deleted

## 2021-03-10 VITALS — BP 128/77 | HR 83 | Ht 66.0 in | Wt 289.0 lb

## 2021-03-10 DIAGNOSIS — G43719 Chronic migraine without aura, intractable, without status migrainosus: Secondary | ICD-10-CM

## 2021-03-10 DIAGNOSIS — F32A Depression, unspecified: Secondary | ICD-10-CM

## 2021-03-10 MED ORDER — EMGALITY 120 MG/ML ~~LOC~~ SOAJ: SUBCUTANEOUS | 12 refills | Status: DC

## 2021-03-10 MED ORDER — QULIPTA 30 MG PO TABS: 30.0000 mg | ORAL_TABLET | Freq: Every day | ORAL | 11 refills | Status: DC

## 2021-03-10 NOTE — Telephone Encounter (Signed)
Meds ordered this encounter  Medications   Galcanezumab-gnlm (EMGALITY) 120 MG/ML SOAJ    Sig: '120mg'$  two consecutive injection as loading dose, then '120mg'$  monthly    Dispense:  1.12 mL    Refill:  12

## 2021-03-10 NOTE — Progress Notes (Signed)
**  Botox 100 units x 2 vials, NDC [id number], Lot [id number], Exp 12/2022, specialty pharmacy.//mck,rn**

## 2021-03-10 NOTE — Telephone Encounter (Addendum)
Received a denial for Qulipta. Per her health plan's criteria, she must try Emgality first. I spoke to the patient. She is agreeable to this therapy change. Dan Maker will be voided at the pharmacy. Dr. Krista Blue will provide a new rx for Emgality.

## 2021-03-10 NOTE — Progress Notes (Signed)
PATIENT: [name] DOB: 07/27/1965  Chief Complaint  Patient presents with   Procedure    Botox     HISTORICAL  [name], Is accompanied by her sister returned for Botox injection as migraine prevention  She has past medical history of HIV, hyperlipidemia, migraines, depression, and non-Hodgkin lymphoma in 2005 with chemotherapy and radiation.   She has history of migraines since 19. In the last 10 years, she reports having daily migraines. She is currently taking Topamax and Zomig with relief. She takes approximately 20-25 tablets of her Zomig per month.    She has aura's that look like floating dirt particles, flashing lights, and she sees Christmas tree lights when they are severe, she has nausea, vomiting and sometimes diarrhea when they're severe. She also complains of diplopia, blurry vision, and  photophobia. She also has had a lightning bolt sensation throughout her body.  She describes them as pulsating and throbbing  on a scale of 5-6/10 which she considers mild, when they are severe they can go as high as 10/10. They usually start in the right temporal area, but will also start on the left temporal and are worse.   Any type of smoke or MSG can precipitate a migraine. They generally last 2-9 days. She has been to the emergency department twice in the last year for migraines. She states "I just can't do this anymore I need to do something about her migraines"  She has tried and failed different preventive medications:  atenolol, verapamil, nortriptyline, Inderal, Depakote without benefit. Atenolol and verapamil caused hypotension. Depakote caused severe vomiting. Topamax causing her worsening headaches, will show tried Zonegran, Topamax ER, she denies significant improvement.  For abortive treatment, she has tried Imitrex, Maxalt with suboptimal response, Zomig as needed since to work best,  she also takes transderm scophalamine patch for nause  I started to give her  BOTOX injection since May 2013, every 3 months, she responded very well,   Previous MRI brain was normal in 2013.  Last Botox injection as migraine prevention was March 2017, she responded very well, but in recent few weeks, she noticed increased migraine headaches, this baby had headache left retro-orbital area severe pounding headache with associated light noise sensitivity, has been ongoing for 3 days, failed to respond to Zomig by mouth, Imitrex injection as needed, she has constant dry heaves, difficulty opening her eyes, In addition, today she complains of restless leg symptoms, difficult to stand or lying still for extended period of time, has tried Neurontin-cause nausea, confusion,  Lyrica-cause excessive weight gain, she has never tried dopamine agonist in the past  I reviewed laboratory in April 2017, normal hemoglobin 12 point 9, mild elevated RDW 15.8, decreased MCV  Update April 27 2016  Last Botox injection was in June 2017, she had left knee arthroscopic surgery in August 2017, complaining of significant joints pain, unsteady gait, before Botox she was having moderate to severe headache daily, even Botox now, she used to 12 tablets of Zomig every months because of frequent moderate to severe headache  Update August 10 2016: Last Botox injection was in October 2017, has been very helpful, but over the past 3-4 weeks, she had frequent almost daily headaches, oftentimes wake her up from sleep, Zomig has been very helpful,  Low dose Mirapex 0.25 milligrams 2 tablets every night has been very helpful for her restless leg symptoms, she reported 80% improvement, laboratory reviewed in January 2018, CD4 count 760, normal hemoglobin  12.3, mildly decreased MCV, increased RDW,  Update Nov 09 2016: She suffered bilateral ear infection, strep throat since last visit, had frequent headaches, nausea,  Update February 22 2017: She responded well to previous injection, complains of a lot of  stress at home  UPDATE May 31 2017: She complains suboptimal response to Botox injection, had daily headache over last week, also complains of a lot of stress at home, average more than 4 headaches in 1 week, lasting all day, with associated nausea vomiting, is taking Zomig as needed, with partial response  UPDATE September 08 2017: She complains of excessive stress, frequent migraine headaches,  UPDATE December 20 2017: She is accompanied by her sister at today's clinical visit, Ajovy injection has helped her migraine, she rated as 25%, but with the combination of Ajovy and Botox injection, she still has 3 migraine headaches each week, has been dealing with daily headaches over the past 1 week, required multiple dose of Zomig treatment  UPDATE Sept 25 2019: She responded well to previous injection, today she complains of 2 weeks history of severe headaches failed home remedy treatment, multiple dose of Zomig,  She is also getting Ajovy injection every month, which has been helpful,  UPDATE Jun 21 2018: she overall responding well to Botox, Ajovy, also taking amitriptyline 25 mg 2 tablets every night, Zomig as needed for abortive treatment, couple headaches each week, usually responding well to triptan treatment, but this particular headache been ongoing for 2 weeks, tried and failed multiple home medications.  UPDATE September 20 2018: She responded well to previous injection, but at the end of injection cycle,She would have more frequent prolonged headaches, she used Zomig as needed,  UPDATE December 19 2018: She responded very well to previous injection.  UPDATE Apr 17 2019: She is with her sister at today's visit, she reported a lot of stress at home, has increased frequency of migraines, almost daily basis over the past few weeks, she also suffered depression anxiety  UPDATE Aug 07 2019: She continues to have frequent headaches at home.  Lamotrigine and Elavil were not helpful, she has stopped taking  them, Botox injection along with aimovig were very helpful.  UPDATE Nov 06 2019: She return for Botox injection as migraine prevention, previous nerve block in combination with Depakote on October 31, 2019 has helped her headache  UPDATE January 15 2020: This is an earlier than expected visit because of one month history of daily severe headache for 1 month, 8 out of 10, holoacranial, light noise sensitivity, nauseous, frequent vomiting, she use zomig 12m 12 tablets each month, she also tried multiple over-the-counter aspirin, tizanidine 4 mg twice a day  She is on polypharmacy for HTN, HLD, DM, urinary retention, taking flomax, planning on cath twice a week,  See urologist Dr. JIrine Seal   She also complains of depression, has not seen pyschiatrist for long time, previous tried zoloft, Cymbalta, wellbutrin, paxil, did not help, complains of unbearable side effect.  Chronic insomnia, ambien 129m she can only sleep 4 to 5 hours maximum,  I have personally reviewed MRI brain wo in 2013 was normal.   Laboratory evaluation in 2020, CD4 cell was 908, 35% helper cell, within normal limit.  Low normal B12 292, normal CBC, A1c was 7.7 Update office February 12 2020 Her headache has improved with adding Effexor XR 37.5 mg daily, tolerating the medicine well, return for Botox injection as migraine prevention today, also Aimovig 140 mg monthly, she complains of diffuse  body achy pain  Update May 27, 2020 She complains of worsening migraine headaches, despite higher dose of Effexor XR 75 mg daily, also complains of worsening depression, hope to take higher dose of Effexor,  Update August 26, 2020: She complains of severe upper neck, occipital area pressure headache over the past 2 to 3 weeks, constant, failed to improve with multiple dose of home medications, overall still responsive to Botox injection per patient, she is also on aimovig  UPDATE December 02 2020: She continues to have frequent headaches,  also concerned about B12 deficiency, reported that Botox injection continue to help her headache,  Update March 10, 2021: She came in with her sister, complains Ocrevus history of headache, tried multiple preventatives without helping, she has been taking aimovig 140 monthly, seems to loose its initial benefit now  REVIEW OF SYSTEMS: Full 14 system review of systems performed and notable only for as above  ALLERGIES: Allergies  Allergen Reactions   Divalproex Sodium Nausea And Vomiting    Causes light-headedness   Gabapentin Nausea And Vomiting    confusion   Ondansetron Hcl Nausea And Vomiting    Triggers migraines, "makes me vomit"   Pineapple    Propoxyphene N-Acetaminophen Nausea And Vomiting   Simvastatin     PATIENT CANNOT BE PRESCRIBED THIS STATIN WHILE RECEIVING NORVIR   Zocor [Simvastatin - High Dose]    Penicillins     REACTION: lips swollen to point of bleeding, sever vaginal irritation  Reports tolerating augmentin well without any complaints or reaction    HOME MEDICATIONS: Current Outpatient Medications  Medication Sig Dispense Refill   AIMOVIG 140 MG/ML SOAJ INJECT 1 AUTO-INJECTOR INTO THE SKIN EVERY 30 DAYS AS DIRECTED 1 mL 11   albuterol (PROAIR HFA) 108 (90 Base) MCG/ACT inhaler USE 1 TO 2 PUFFS EVERY 4-6 HOURS AS NEEDED FOR SHORTNESS OF BREATH ORWHEEZING 8.5 g 1   Beclomethasone Dipropionate 80 MCG/ACT AERS Place 2 Inhalers into the nose daily. 8.7 g 10   blood glucose meter kit and supplies Dispense based on patient and insurance preference. Use up to four times daily as directed. (FOR ICD-9 250.00, 250.01). 1 each 0   botulinum toxin Type A (BOTOX) 100 units SOLR injection INJECT UP TO 200 UNITS  INTRAMUSCULARLY EVERY 90  DAYS (DISCARD UNUSED AFTER  FIRST USE) 2 each 3   Calcium Carbonate-Vitamin D (CALCIUM-VITAMIN D) 500-200 MG-UNIT per tablet Take 2 tablets by mouth daily.      diclofenac Sodium (VOLTAREN) 1 % GEL APPLY TOPICALLY TO AFFECTED AREA 4 GRAMS4  TIMES DAILY 100 g 11   FARXIGA 10 MG TABS tablet TAKE 1 TABLET BY MOUTH EVERY DAY 90 tablet 3   fluconazole (DIFLUCAN) 100 MG tablet TAKE 1 TABLET BY MOUTH EVERY DAY 14 tablet 3   FLUoxetine (PROZAC) 20 MG capsule Take 1 capsule (20 mg total) by mouth daily. 30 capsule 2   glucose blood test strip Use as instructed 100 each 12   ibuprofen (ADVIL) 800 MG tablet TAKE 1 TABLET BY MOUTH EVERY 8 HOURS AS NEEDED 30 tablet 5   liraglutide (VICTOZA) 18 MG/3ML SOPN Inject 0.6 mg daily for 1 week, then increase to 1.2 mg daily 3 pen 0   lisinopril (ZESTRIL) 20 MG tablet TAKE 1 TABLET BY MOUTH EVERY DAY 90 tablet 1   metroNIDAZOLE (METROGEL) 0.75 % gel Apply 1 application topically 2 (two) times daily.     mometasone-formoterol (DULERA) 100-5 MCG/ACT AERO USE 2 PUFFS BY MOUTH THE FIRST THING  IN THE MORNING AND THEN ANOTHER 2 PUFFS ABOUT 12 HOURS LATER 13 g 5   pantoprazole (PROTONIX) 40 MG tablet TAKE 1 TABLET BY MOUTH 2 TIMES A DAY FORACID REFLUX 60 tablet 4   Plecanatide (TRULANCE) 3 MG TABS Take 1 tablet by mouth daily. 30 tablet 0   promethazine (PHENERGAN) 25 MG suppository PLACE 1 SUPPOSITORY RECTALLY EVERY 6 HOURS AS NEEDED FOR NAUSEA OR VOMITING 12 each 11   propranolol (INDERAL) 20 MG tablet TAKE 1 TABLET BY MOUTH 2 TIMES A DAY 180 tablet 3   rosuvastatin (CRESTOR) 20 MG tablet Take 1 tablet (20 mg total) by mouth daily. 90 tablet 1   SYMTUZA 800-150-200-10 MG TABS TAKE 1 TABLET BY MOUTH EVERY MORNING WITH BREAKFAST 30 tablet 5   Tamsulosin HCl (FLOMAX) 0.4 MG CAPS Take 0.4 mg by mouth daily. 1-2 daily     terconazole (TERAZOL 7) 0.4 % vaginal cream PLACE 1 APPLICATORFUL VAGINALLY AT BEDTIME 45 g 3   tiZANidine (ZANAFLEX) 4 MG tablet TAKE 1 TABLET BY MOUTH EVERY 8 HOURS AS NEEDED FOR MUSCLE SPASMS 90 tablet 11   vitamin B-12 (CYANOCOBALAMIN) 1000 MCG tablet Take 1,000 mcg by mouth daily.     zolmitriptan (ZOMIG) 5 MG tablet Take 1 tab at onset of migraine.  May repeat, with 1 tab, in 2 hrs, if  needed.  Max dose of 2 tabs/24hrs.  Must last 30 days. #12/30 12 tablet 11   zolpidem (AMBIEN) 10 MG tablet TAKE 1 TABLET BY MOUTH AT BEDTIME AS NEEDED FOR SLEEP 30 tablet 5   No current facility-administered medications for this visit.    PAST MEDICAL HISTORY: Past Medical History:  Diagnosis Date   Acute gastritis without mention of hemorrhage 01/01/2002   Anemia 10/2009   macrocytic anemia with baseline MCV 104-106   Anxiety    Asthma    Boil 01/05/2017   Cellulitis 05/20/2020   Chondromalacia of right knee 04/20/2012   Complex tear of medial meniscus of left knee as current injury 02/25/2016   Complication of anesthesia    hard to wake up   COPD (chronic obstructive pulmonary disease) (HCC)    chronic bronchitis   Derangement of anterior horn of lateral meniscus of left knee 02/25/2016   Duodenitis without mention of hemorrhage 01/01/2002   DVT (deep venous thrombosis) (HCC)    history clot lt groin when she had lymphoma-in remision now.   Elevated triglycerides with high cholesterol 01/05/2017   Gastroparesis    GERD (gastroesophageal reflux disease)    Hiatal hernia 01/01/2002   HIV infection (HCC)    CD4 = 570 (05/2010), VL undetectation   Hyperlipidemia    IBS (irritable bowel syndrome)    Migraines    on topamax and triptans, frequent (almost daily) attacks    MRSA (methicillin resistant staph aureus) culture positive 12/20/2010   Non Hodgkin's lymphoma (HCC)    stage II, s/p resection, chemotherapy, radiotherapy, Dr. Cyndie Chime is her oncologist.    PUD (peptic ulcer disease) 07/1997   per EGD report 07/1997 with history of esophagitis   Restless leg syndrome 12/24/2015   Shingles    in lumbar dermatome   Spondylolisthesis of lumbar region    L5-S1   TMJ (dislocation of temporomandibular joint)    Traumatic tear of lateral meniscus of right knee 04/20/2012   Vaginal yeast infection 09/25/2017    PAST SURGICAL HISTORY: Past Surgical History:  Procedure Laterality Date    CHOLECYSTECTOMY     ENDOMETRIAL ABLATION  KNEE ARTHROSCOPY Right 04/20/12   KNEE ARTHROSCOPY WITH LATERAL MENISECTOMY Left 02/25/2016   Procedure: LEFT KNEE ARTHROSCOPY WITH MEDIAL AND  LATERAL MENISECTOMIES;  Surgeon: Marchia Bond, MD;  Location: [facility];  Service: Orthopedics;  Laterality: Left;   KNEE ARTHROSCOPY WITH MEDIAL MENISECTOMY Left 02/25/2016   Procedure: KNEE ARTHROSCOPY WITH MEDIAL MENISECTOMY;  Surgeon: Marchia Bond, MD;  Location: [facility];  Service: Orthopedics;  Laterality: Left;   TONSILLECTOMY     TYMPANOPLASTY Left    TYMPANOSTOMY TUBE PLACEMENT     as child-both   UPPER GASTROINTESTINAL ENDOSCOPY  2003, 2007   done by Dr. Velora Heckler    FAMILY HISTORY: Family History  Problem Relation Age of Onset   Hypertension Mother    Stroke Mother    Heart disease Mother    Diabetes Mother    Colon polyps Mother    Breast cancer Paternal Grandmother    Kidney cancer Maternal Uncle    Heart disease Maternal Uncle    Diabetes Maternal Uncle    Diabetes Maternal Grandmother    Heart disease Maternal Grandmother    Emphysema Paternal Uncle        never smoker   Emphysema Paternal Grandfather        never smoker   Colon cancer Paternal Aunt        with met to lung    Non-Hodgkin's lymphoma Maternal Uncle    Heart disease Maternal Grandfather     SOCIAL HISTORY:  Social History   Socioeconomic History   Marital status: Divorced    Spouse name: Not on file   Number of children: 0   Years of education: 12   Highest education level: Not on file  Occupational History   Occupation: disabled    Fish farm manager: UNEMPLOYED  Tobacco Use   Smoking status: Never   Smokeless tobacco: Never  Vaping Use   Vaping Use: Never used  Substance and Sexual Activity   Alcohol use: No    Alcohol/week: 0.0 standard drinks   Drug use: No   Sexual activity: Not Currently    Partners: Male    Birth control/protection: Surgical  Other Topics  Concern   Not on file  Social History Narrative      Patient is disabled.   Right handed.   Caffeine - Diet soda., tea   Education - high school   Social Determinants of Health   Financial Resource Strain: Not on file  Food Insecurity: Not on file  Transportation Needs: Not on file  Physical Activity: Not on file  Stress: Not on file  Social Connections: Not on file  Intimate Partner Violence: Not on file     PHYSICAL EXAM   Vitals:   03/10/21 1104  BP: 128/77  Pulse: 83  Weight: 289 lb (131.1 kg)  Height: $Remove'5\' 6"'BsERBND$  (1.676 m)   Not recorded    ASSESSMENT AND PLAN  [name] is a 55 y.o. female    History of HIV, under good control, CD4 was within normal limit, Chronic migraine headaches Depression anxiety, Chronic insomnia History of non-Hodgkin's lymphoma, completed with chemo therapy  Her significant daily migraine headaches happened in the scenario of poorly controlled depression anxiety, chronic insomnia,  Zomig as needed, may combine it with tizanidine, Zofran, Aleve, only treat moderate to severe headaches,  For migraine prevention she is on Botox injection, Effexor xr 150 mg daily, aimovig seems to have looses initial benefit, will try Emgality  Botox injection for chronic  migraine prevention, injection was performed according to [place] protocol,  5 units of Botox was injected into each side,   Bilateral frontalis 4 injection sites Bilateral temporalis 8 injection sites Bilateral occipitalis 6 injection sites Bilateral cervical paraspinals 4 injection sites Bilateral upper trapezius 6 injection sites  Extra 60 unites were injected into bilateral cervical paraspinal and upper trapezius  Marcial Pacas, M.D. Ph.D.  [facility] [place], [place] [address] Phone: [phone] Fax:      [phone]

## 2021-03-10 NOTE — Telephone Encounter (Signed)
PA for Qulipta started on covermymeds (key: Ler[place]). Pt has a Medicaid plan though [employer] [id number] S). [id number]. Decision pending.

## 2021-03-10 NOTE — Addendum Note (Signed)
Addended by: Marcial Pacas on: 03/10/2021 02:59 PM   Modules accepted: Orders

## 2021-03-11 DIAGNOSIS — E1165 Type 2 diabetes mellitus with hyperglycemia: Secondary | ICD-10-CM | POA: Diagnosis not present

## 2021-03-11 DIAGNOSIS — Z833 Family history of diabetes mellitus: Secondary | ICD-10-CM | POA: Diagnosis not present

## 2021-03-11 DIAGNOSIS — Z7984 Long term (current) use of oral hypoglycemic drugs: Secondary | ICD-10-CM | POA: Diagnosis not present

## 2021-03-11 DIAGNOSIS — Z794 Long term (current) use of insulin: Secondary | ICD-10-CM | POA: Diagnosis not present

## 2021-03-11 DIAGNOSIS — B20 Human immunodeficiency virus [HIV] disease: Secondary | ICD-10-CM | POA: Diagnosis not present

## 2021-03-11 DIAGNOSIS — E7800 Pure hypercholesterolemia, unspecified: Secondary | ICD-10-CM | POA: Diagnosis not present

## 2021-03-11 NOTE — Telephone Encounter (Signed)
PA for Emgality started on covermymeds (key: [id number]). Decision pending.

## 2021-03-11 NOTE — Telephone Encounter (Signed)
[id number] approved through 06/10/2021.

## 2021-03-15 ENCOUNTER — Ambulatory Visit: Payer: Medicaid Other | Admitting: Obstetrics

## 2021-03-18 NOTE — Progress Notes (Signed)
Internal Medicine Clinic Attending ° °Case discussed with Dr. Rehman  At the time of the visit.  We reviewed the resident’s history and exam and pertinent patient test results.  I agree with the assessment, diagnosis, and plan of care documented in the resident’s note.  ° °

## 2021-03-29 ENCOUNTER — Ambulatory Visit: Payer: Medicaid Other | Admitting: Obstetrics

## 2021-03-31 ENCOUNTER — Ambulatory Visit: Payer: Medicaid Other | Admitting: Obstetrics

## 2021-04-02 ENCOUNTER — Ambulatory Visit: Payer: Medicaid Other

## 2021-04-05 ENCOUNTER — Other Ambulatory Visit: Payer: Self-pay | Admitting: Obstetrics

## 2021-04-05 DIAGNOSIS — B3731 Acute candidiasis of vulva and vagina: Secondary | ICD-10-CM

## 2021-04-07 ENCOUNTER — Telehealth: Payer: Self-pay | Admitting: Student

## 2021-04-07 NOTE — Telephone Encounter (Signed)
..  Patient declines further follow up and engagement by the Managed Medicaid Team. Appropriate care team members and provider have been notified via electronic communication. The Managed Medicaid Team is available to follow up with the patient after provider conversation with the patient regarding recommendation for engagement and subsequent re-referral to the Managed Medicaid Team.    Jennifer Alley Care Guide, High Risk Medicaid Managed Care Embedded Care Coordination [place]  [facility]   

## 2021-04-12 ENCOUNTER — Telehealth: Payer: Self-pay

## 2021-04-12 NOTE — Telephone Encounter (Signed)
PA  came through on cover my meds for PT ( ZOLPIDEM TARTRATE 10 MG TABS )  was done and sent back awaiting approval or denial ..

## 2021-04-13 NOTE — Telephone Encounter (Signed)
DECISION :   Approved today: 04/13/2021  Request Reference Number: [id number]. ZOLPIDEM TAB 10MG  is approved through 10/12/2021. For further questions, call [employer] at [phone]. Drug Zolpidem Tartrate 10MG  tablets Form    COPY SENT TO PHARMACY

## 2021-04-13 NOTE — Telephone Encounter (Signed)
DECISION : DENIED    ( 10/11: but redone and resent in awaiting another decision )

## 2021-04-29 ENCOUNTER — Encounter: Payer: Self-pay | Admitting: Neurology

## 2021-04-29 ENCOUNTER — Ambulatory Visit: Payer: Medicaid Other | Admitting: Neurology

## 2021-04-29 VITALS — BP 143/80 | HR 83 | Ht 66.0 in | Wt 288.0 lb

## 2021-04-29 DIAGNOSIS — G43719 Chronic migraine without aura, intractable, without status migrainosus: Secondary | ICD-10-CM | POA: Diagnosis not present

## 2021-04-29 DIAGNOSIS — R569 Unspecified convulsions: Secondary | ICD-10-CM | POA: Diagnosis not present

## 2021-04-29 DIAGNOSIS — E1142 Type 2 diabetes mellitus with diabetic polyneuropathy: Secondary | ICD-10-CM | POA: Diagnosis not present

## 2021-04-29 DIAGNOSIS — Z79899 Other long term (current) drug therapy: Secondary | ICD-10-CM

## 2021-04-29 NOTE — Progress Notes (Signed)
PATIENT: [name] DOB: [birth year]  Chief Complaint  Patient presents with   Follow-up    New rm, here to discuss intermittent tremors      ASSESSMENT AND PLAN  [name] is a 55 y.o. female    History of HIV, under good control, CD4 was within normal limit, Chronic migraine headaches  Emgality, Botox injection, propanolol, Prozac, as preventive medication  Zomig, Phenergan suppository, tizanidine, NSAIDs as needed for abortive treatment  History of non-Hodgkin's lymphoma, completed with chemo therapy  Her significant daily migraine headaches happened in the scenario of poorly controlled depression anxiety, chronic insomnia,  Zomig as needed, may combine it with tizanidine, Zofran, Aleve, only treat moderate to severe headaches,  Depression anxiety New onset intermittent body tremor episode with no loss of consciousness,  Possibility including her mood disorder related, deconditioning, polypharmacy side effect, poorly controlled diabetes  Advised her to optimize her diabetic control,  EEG  [name], Is accompanied by her sister  She has past medical history of HIV, hyperlipidemia, migraines, depression, and non-Hodgkin lymphoma in 2005 with chemotherapy and radiation.   She has history of migraines since 19. In the last 10 years, she reports having daily migraines. She is currently taking Topamax and Zomig with relief. She takes approximately 20-25 tablets of her Zomig per month.    She has aura's that look like floating dirt particles, flashing lights, and she sees Christmas tree lights when they are severe, she has nausea, vomiting and sometimes diarrhea when they're severe. She also complains of diplopia, blurry vision, and  photophobia. She also has had a lightning bolt sensation throughout her body.  She describes them as pulsating and throbbing  on a scale of 5-6/10 which she considers mild, when they are severe they can go as high as 10/10. They  usually start in the right temporal area, but will also start on the left temporal and are worse.   Any type of smoke or MSG can precipitate a migraine. They generally last 2-9 days. She has been to the emergency department twice in the last year for migraines. She states "I just can't do this anymore I need to do something about her migraines"  She has tried and failed different preventive medications:  atenolol, verapamil, nortriptyline, Inderal, Depakote without benefit. Atenolol and verapamil caused hypotension. Depakote caused severe vomiting. Topamax causing her worsening headaches, will show tried Zonegran, Topamax ER, she denies significant improvement.  For abortive treatment, she has tried Imitrex, Maxalt with suboptimal response, Zomig as needed since to work best,  she also takes transderm scophalamine patch for nause  I started to give her BOTOX injection since May 2013, every 3 months, she responded very well,   Previous MRI brain was normal in 2013.  Emgality was started few months ago, along with Botox as preventive medication, her migraine is under much better control, she still has average 3 migraines in a week, taking Zomig, tizanidine, Phenergan suppository, NSAIDs as abortive treatment, which has been helpful,  Also complains of chronic low back pain, personally reviewed MRI lumbar in February 2020, chronic degenerative changes, L5-S1, L5 pars fracture, advanced the disc and moderate posterior element degeneration, no significant spinal lateral recess stenosis, but moderate to severe chronic bilateral L5 foraminal stenosis,  Reviewed laboratory evaluation, diabetes under poorly controlled, A1c was 10 in September 2022, started insulin recently, low normal B12 271, lipid profile, triglycerides 717, cholesterol 350, LDL 161,   Today she complains  of intermittent tremor since 2020, diffuse neck, arm, whole body jerking episode, I was able to watch a video clip, she was sitting in  sofa, whole body jerking, eye blinking, no loss of consciousness, still follow commands, sister reported that she has similar jerking episode 2, Lasting for few hours, up to 1 and half weeks, patient has difficulty sleeping during the spells, but no loss of consciousness  PHYSICAL EXAM   Vitals:   04/29/21 1256  BP: (!) 143/80  Pulse: 83  Weight: 288 lb (130.6 kg)  Height: 5' 6"  (1.676 m)    PHYSICAL EXAMNIATION:  Gen: NAD, conversant, well nourised, well groomed                     Cardiovascular: Regular rate rhythm, no peripheral edema, warm, nontender. Eyes: Conjunctivae clear without exudates or hemorrhage Neck: Supple, no carotid bruits. Pulmonary: Clear to auscultation bilaterally   NEUROLOGICAL EXAM:  MENTAL STATUS: Deconditioned depressed looking central obesity, Speech/Cognition: Awake, alert, normal speech, oriented to history taking and casual conversation.  CRANIAL NERVES: CN II: Visual fields are full to confrontation.  Pupils are round equal and briskly reactive to light. CN III, IV, VI: extraocular movement are normal. No ptosis. CN V: Facial sensation is intact to light touch. CN VII: Face is symmetric with normal eye closure and smile. CN VIII: Hearing is normal to casual conversation CN IX, X: Palate elevates symmetrically. Phonation is normal. CN XI: Head turning and shoulder shrug are intact CN XII: Tongue is midline with normal movements and no atrophy.  MOTOR: Muscle bulk and tone are normal. Muscle strength is normal.  REFLEXES: Reflexes are 2  and symmetric at the biceps, triceps, knees and ankles. Plantar responses are flexor.  SENSORY: Intact to light touch, pinprick, positional and vibratory sensation at fingers and toes.  COORDINATION: There is no trunk or limb ataxia.    GAIT/STANCE: Posture is normal. Gait is steady with normal steps, base, arm swing and turning.     REVIEW OF SYSTEMS: Full 14 system review of systems performed and  notable only for as above  ALLERGIES: Allergies  Allergen Reactions   Divalproex Sodium Nausea And Vomiting    Causes light-headedness   Gabapentin Nausea And Vomiting    confusion   Ondansetron Hcl Nausea And Vomiting    Triggers migraines, "makes me vomit"   Pineapple    Propoxyphene N-Acetaminophen Nausea And Vomiting   Simvastatin     PATIENT CANNOT BE PRESCRIBED THIS STATIN WHILE RECEIVING NORVIR   Zocor [Simvastatin - High Dose]    Penicillins     REACTION: lips swollen to point of bleeding, sever vaginal irritation  Reports tolerating augmentin well without any complaints or reaction    HOME MEDICATIONS: Current Outpatient Medications  Medication Sig Dispense Refill   albuterol (PROAIR HFA) 108 (90 Base) MCG/ACT inhaler USE 1 TO 2 PUFFS EVERY 4-6 HOURS AS NEEDED FOR SHORTNESS OF BREATH ORWHEEZING 8.5 g 1   Beclomethasone Dipropionate 80 MCG/ACT AERS Place 2 Inhalers into the nose daily. 8.7 g 10   blood glucose meter kit and supplies Dispense based on patient and insurance preference. Use up to four times daily as directed. (FOR ICD-9 250.00, 250.01). 1 each 0   botulinum toxin Type A (BOTOX) 100 units SOLR injection INJECT UP TO 200 UNITS  INTRAMUSCULARLY EVERY 90  DAYS (DISCARD UNUSED AFTER  FIRST USE) 2 each 3   Calcium Carbonate-Vitamin D (CALCIUM-VITAMIN D) 500-200 MG-UNIT per tablet Take 2  tablets by mouth daily.      diclofenac Sodium (VOLTAREN) 1 % GEL APPLY TOPICALLY TO AFFECTED AREA 4 GRAMS4 TIMES DAILY 100 g 11   FARXIGA 10 MG TABS tablet TAKE 1 TABLET BY MOUTH EVERY DAY 90 tablet 3   fluconazole (DIFLUCAN) 100 MG tablet TAKE 1 TABLET BY MOUTH EVERY DAY 14 tablet 3   FLUoxetine (PROZAC) 20 MG capsule Take 1 capsule (20 mg total) by mouth daily. 30 capsule 2   Galcanezumab-gnlm (EMGALITY) 120 MG/ML SOAJ 120m two consecutive injection as loading dose, then 1257mmonthly 1.12 mL 12   glucose blood test strip Use as instructed 100 each 12   ibuprofen (ADVIL) 800 MG  tablet TAKE 1 TABLET BY MOUTH EVERY 8 HOURS AS NEEDED 30 tablet 5   liraglutide (VICTOZA) 18 MG/3ML SOPN Inject 0.6 mg daily for 1 week, then increase to 1.2 mg daily 3 pen 0   lisinopril (ZESTRIL) 20 MG tablet TAKE 1 TABLET BY MOUTH EVERY DAY 90 tablet 1   metroNIDAZOLE (METROGEL) 0.75 % gel Apply 1 application topically 2 (two) times daily.     mometasone-formoterol (DULERA) 100-5 MCG/ACT AERO USE 2 PUFFS BY MOUTH THE FIRST THING IN THE MORNING AND THEN ANOTHER 2 PUFFS ABOUT 12 HOURS LATER 13 g 5   pantoprazole (PROTONIX) 40 MG tablet TAKE 1 TABLET BY MOUTH 2 TIMES A DAY FORACID REFLUX 60 tablet 4   Plecanatide (TRULANCE) 3 MG TABS Take 1 tablet by mouth daily. 30 tablet 0   promethazine (PHENERGAN) 25 MG suppository PLACE 1 SUPPOSITORY RECTALLY EVERY 6 HOURS AS NEEDED FOR NAUSEA OR VOMITING 12 each 11   propranolol (INDERAL) 20 MG tablet TAKE 1 TABLET BY MOUTH 2 TIMES A DAY 180 tablet 3   rosuvastatin (CRESTOR) 20 MG tablet Take 1 tablet (20 mg total) by mouth daily. 90 tablet 1   SYMTUZA 800-150-200-10 MG TABS TAKE 1 TABLET BY MOUTH EVERY MORNING WITH BREAKFAST 30 tablet 5   Tamsulosin HCl (FLOMAX) 0.4 MG CAPS Take 0.4 mg by mouth daily. 1-2 daily     terconazole (TERAZOL 7) 0.4 % vaginal cream PLACE 1 APPLICATORFUL VAGINALLY AT BEDTIME 45 g 3   tiZANidine (ZANAFLEX) 4 MG tablet TAKE 1 TABLET BY MOUTH EVERY 8 HOURS AS NEEDED FOR MUSCLE SPASMS 90 tablet 11   vitamin B-12 (CYANOCOBALAMIN) 1000 MCG tablet Take 1,000 mcg by mouth daily.     zolmitriptan (ZOMIG) 5 MG tablet Take 1 tab at onset of migraine.  May repeat, with 1 tab, in 2 hrs, if needed.  Max dose of 2 tabs/24hrs.  Must last 30 days. #12/30 12 tablet 11   zolpidem (AMBIEN) 10 MG tablet TAKE 1 TABLET BY MOUTH AT BEDTIME AS NEEDED FOR SLEEP 30 tablet 5   No current facility-administered medications for this visit.    PAST MEDICAL HISTORY: Past Medical History:  Diagnosis Date   Acute gastritis without mention of hemorrhage 01/01/2002    Anemia 10/2009   macrocytic anemia with baseline MCV 104-106   Anxiety    Asthma    Boil 01/05/2017   Cellulitis 05/20/2020   Chondromalacia of right knee 04/20/2012   Complex tear of medial meniscus of left knee as current injury 02/10/36/3428 Complication of anesthesia    hard to wake up   COPD (chronic obstructive pulmonary disease) (HCC)    chronic bronchitis   Derangement of anterior horn of lateral meniscus of left knee 02/25/2016   Duodenitis without mention of hemorrhage 01/01/2002  DVT (deep venous thrombosis) (HCC)    history clot lt groin when she had lymphoma-in remision now.   Elevated triglycerides with high cholesterol 01/05/2017   Gastroparesis    GERD (gastroesophageal reflux disease)    Hiatal hernia 01/01/2002   HIV infection ([place])    CD4 = 570 (05/2010), VL undetectation   Hyperlipidemia    IBS (irritable bowel syndrome)    Migraines    on topamax and triptans, frequent (almost daily) attacks    MRSA (methicillin resistant staph aureus) culture positive 12/20/2010   Non Hodgkin's lymphoma ([place])    stage II, s/p resection, chemotherapy, radiotherapy, Dr. Beryle Beams is her oncologist.    PUD (peptic ulcer disease) 07/1997   per EGD report 07/1997 with history of esophagitis   Restless leg syndrome 12/24/2015   Shingles    in lumbar dermatome   Spondylolisthesis of lumbar region    L5-S1   TMJ (dislocation of temporomandibular joint)    Traumatic tear of lateral meniscus of right knee 04/20/2012   Vaginal yeast infection 09/25/2017    PAST SURGICAL HISTORY: Past Surgical History:  Procedure Laterality Date   CHOLECYSTECTOMY     ENDOMETRIAL ABLATION     KNEE ARTHROSCOPY Right 04/20/12   KNEE ARTHROSCOPY WITH LATERAL MENISECTOMY Left 02/25/2016   Procedure: LEFT KNEE ARTHROSCOPY WITH MEDIAL AND  LATERAL MENISECTOMIES;  Surgeon: Marchia Bond, MD;  Location: [facility];  Service: Orthopedics;  Laterality: Left;   KNEE ARTHROSCOPY WITH MEDIAL  MENISECTOMY Left 02/25/2016   Procedure: KNEE ARTHROSCOPY WITH MEDIAL MENISECTOMY;  Surgeon: Marchia Bond, MD;  Location: [facility];  Service: Orthopedics;  Laterality: Left;   TONSILLECTOMY     TYMPANOPLASTY Left    TYMPANOSTOMY TUBE PLACEMENT     as child-both   UPPER GASTROINTESTINAL ENDOSCOPY  2003, 2007   done by Dr. Velora Heckler    FAMILY HISTORY: Family History  Problem Relation Age of Onset   Hypertension Mother    Stroke Mother    Heart disease Mother    Diabetes Mother    Colon polyps Mother    Breast cancer Paternal Grandmother    Kidney cancer Maternal Uncle    Heart disease Maternal Uncle    Diabetes Maternal Uncle    Diabetes Maternal Grandmother    Heart disease Maternal Grandmother    Emphysema Paternal Uncle        never smoker   Emphysema Paternal Grandfather        never smoker   Colon cancer Paternal Aunt        with met to lung    Non-Hodgkin's lymphoma Maternal Uncle    Heart disease Maternal Grandfather     SOCIAL HISTORY:  Social History   Socioeconomic History   Marital status: Divorced    Spouse name: Not on file   Number of children: 0   Years of education: 12   Highest education level: Not on file  Occupational History   Occupation: disabled    Fish farm manager: UNEMPLOYED  Tobacco Use   Smoking status: Never   Smokeless tobacco: Never  Vaping Use   Vaping Use: Never used  Substance and Sexual Activity   Alcohol use: No    Alcohol/week: 0.0 standard drinks   Drug use: No   Sexual activity: Not Currently    Partners: Male    Birth control/protection: Surgical  Other Topics Concern   Not on file  Social History Narrative      Patient is disabled.   Right  handed.   Caffeine - Diet soda., tea   Education - high school   Social Determinants of Health   Financial Resource Strain: Not on file  Food Insecurity: Not on file  Transportation Needs: Not on file  Physical Activity: Not on file  Stress: Not on file  Social  Connections: Not on file  Intimate Partner Violence: Not on file       Marcial Pacas, M.D. Ph.D.  [facility] [place], [place] [address] Phone: [phone] Fax:      [phone]

## 2021-05-04 ENCOUNTER — Other Ambulatory Visit: Payer: Self-pay | Admitting: Student

## 2021-05-04 ENCOUNTER — Other Ambulatory Visit: Payer: Self-pay | Admitting: Neurology

## 2021-05-04 ENCOUNTER — Other Ambulatory Visit: Payer: Self-pay | Admitting: Internal Medicine

## 2021-05-04 DIAGNOSIS — F3341 Major depressive disorder, recurrent, in partial remission: Secondary | ICD-10-CM

## 2021-05-04 DIAGNOSIS — I10 Essential (primary) hypertension: Secondary | ICD-10-CM

## 2021-05-04 NOTE — Telephone Encounter (Signed)
Next appt scheduled 05/11/21 with PCP.

## 2021-05-06 ENCOUNTER — Other Ambulatory Visit: Payer: Medicaid Other | Admitting: *Deleted

## 2021-05-11 ENCOUNTER — Encounter: Payer: Medicaid Other | Admitting: Student

## 2021-05-13 ENCOUNTER — Other Ambulatory Visit: Payer: Medicaid Other | Admitting: *Deleted

## 2021-05-17 ENCOUNTER — Telehealth: Payer: Self-pay | Admitting: Student

## 2021-05-17 ENCOUNTER — Encounter: Payer: Medicaid Other | Admitting: Student

## 2021-05-17 ENCOUNTER — Ambulatory Visit (INDEPENDENT_AMBULATORY_CARE_PROVIDER_SITE_OTHER): Payer: Medicaid Other | Admitting: Student

## 2021-05-17 DIAGNOSIS — R1110 Vomiting, unspecified: Secondary | ICD-10-CM | POA: Insufficient documentation

## 2021-05-17 MED ORDER — GUAIFENESIN-CODEINE 100-6.3 MG/5ML PO SOLN
15.0000 mL | Freq: Every evening | ORAL | 0 refills | Status: DC | PRN
Start: 2021-05-17 — End: 2021-09-30

## 2021-05-17 MED ORDER — PROCHLORPERAZINE MALEATE 5 MG PO TABS: 5.0000 mg | ORAL_TABLET | Freq: Four times a day (QID) | ORAL | 0 refills | Status: DC | PRN

## 2021-05-17 NOTE — Assessment & Plan Note (Addendum)
She isAssessment: Patient states she has not felt well for the past 5 days.  She also had nausea and headache.  Early yesterday morning she began vomiting.  She states that initially she vomited up the food she ate but then it turned to bilious vomiting.  She did note some minimal blood in the vomit that she suspect is secondary from her sore throat and consistent dry heaves.  Since 4 AM she has been able to tolerate ginger ale as well as crackers.  She has been coughing with clear phlegm.  She notes the cough is worse than her usual chronic bronchitis cough.  She denies any shortness of breath or difficulty breathing.  She was exposed to her sister multiple times last week who tested positive for COVID-19 infection as well as RSV.  Discussed with patient I suspect that she may have COVID infection and that she be tested.  She is unvaccinated.  We will plan to treat her conservatively at this time and have patient call back with results of COVID test.  She was then within 5-day window and is high risk with her history of bronchitis and hypertension so we will consider Paxlovid  the treatment if positive.  Plan: -Conservative treatment with Compazine and guaifenesin/codeine -Follow-up COVID test consider Paxlovid  Addendum: COVID test positive, will start patient on paxlovid.  Despite being on the 6-day of her not feeling well rested progression.  Reviewed her medication list instructed patient to hold off on taking zolmitriptan, Ambien, rosuvastatin for the next few days while taking Paxil it.  Called our pharmacy team to discuss if it would be safe with patient taking Symtuza HIV medication.  They state patient should continue both and just monitor for side effects.  Her GFR is normal.  Discussed with patient that if she notices any side effects or any changes to call our clinic immediately and stop taking the Paxlovid  Patient agrees to this plan.

## 2021-05-17 NOTE — Telephone Encounter (Signed)
Pt reports Covid Contact with her sister. She is now Throwing up, Has Fever and a Upset Stomach.  Pt had appt with her PCP today @ 1:15.  Appt had now been changed to a telehealth .  Pt requesting a call back to make sure I is okay to wait until this after for her appt.

## 2021-05-17 NOTE — Progress Notes (Signed)
   CC: Vomiting, not feeling well  This is a telephone encounter between [name] and [employer] on 05/17/2021 for not feeling well for the past 5 days with associated vomiting and some abdominal pain. The visit was conducted with the patient located at home and [name] at [facility]. The patient's identity was confirmed using their DOB and current address. The patient has consented to being evaluated through a telephone encounter and understands the associated risks (an examination cannot be done and the patient may need to come in for an appointment) / benefits (allows the patient to remain at home, decreasing exposure to coronavirus). I personally spent 13 minutes on medical discussion.   HPI:  [name] is a 55 y.o. with PMH as below.   Please see A&P for assessment of the patient's acute and chronic medical conditions.   Past Medical History:  Diagnosis Date   Acute gastritis without mention of hemorrhage 01/01/2002   Anemia 10/2009   macrocytic anemia with baseline MCV 104-106   Anxiety    Asthma    Boil 01/05/2017   Cellulitis 05/20/2020   Chondromalacia of right knee 04/20/2012   Complex tear of medial meniscus of left knee as current injury 07/24/9756   Complication of anesthesia    hard to wake up   COPD (chronic obstructive pulmonary disease) (HCC)    chronic bronchitis   Derangement of anterior horn of lateral meniscus of left knee 02/25/2016   Duodenitis without mention of hemorrhage 01/01/2002   DVT (deep venous thrombosis) (HCC)    history clot lt groin when she had lymphoma-in remision now.   Elevated triglycerides with high cholesterol 01/05/2017   Gastroparesis    GERD (gastroesophageal reflux disease)    Hiatal hernia 01/01/2002   HIV infection ([place])    CD4 = 570 (05/2010), VL undetectation   Hyperlipidemia    IBS (irritable bowel syndrome)    Migraines    on topamax and triptans, frequent (almost daily) attacks    MRSA (methicillin resistant staph  aureus) culture positive 12/20/2010   Non Hodgkin's lymphoma ([place])    stage II, s/p resection, chemotherapy, radiotherapy, Dr. Beryle Beams is her oncologist.    PUD (peptic ulcer disease) 07/1997   per EGD report 07/1997 with history of esophagitis   Restless leg syndrome 12/24/2015   Shingles    in lumbar dermatome   Spondylolisthesis of lumbar region    L5-S1   TMJ (dislocation of temporomandibular joint)    Traumatic tear of lateral meniscus of right knee 04/20/2012   Vaginal yeast infection 09/25/2017   Review of Systems:   Positive for not feeling well, nausea, vomiting, abdominal pain, decreased appetite, cough with clear phlegm. Negative for shortness of breath  Assessment & Plan:   See Encounters Tab for problem based charting.  Patient discussed with Dr. Delano Metz Internal Medicine Resident

## 2021-05-17 NOTE — Patient Instructions (Signed)
Thank you, [name] for allowing [place] to provide your care today.  Please continue conservative treatment for your suspected COVID infection.  We will be prescribing cough medicine as well as medication for your nausea.  If you test positive for COVID we will consider treating it with an antiviral medication.  I recommend that you get vaccinated after you have recovered from this  Referrals ordered today:   Referral Orders  No referral(s) requested today     I have ordered the following medication/changed the following medications:   Stop the following medications: There are no discontinued medications.   Start the following medications: Meds ordered this encounter  Medications   prochlorperazine (COMPAZINE) 5 MG tablet    Sig: Take 1 tablet (5 mg total) by mouth every 6 (six) hours as needed for up to 5 days for nausea or vomiting.    Dispense:  10 tablet    Refill:  0   guaiFENesin-Codeine 100-6.3 MG/5ML SOLN    Sig: Take 15 mLs by mouth at bedtime as needed.    Dispense:  473 mL    Refill:  0     Follow up: 2-3 months    Should you have any questions or concerns please call the internal medicine clinic at [phone].    [name], D.O. [place]

## 2021-05-17 NOTE — Telephone Encounter (Signed)
RTC, patient informed to try and hydrate and to rest until afternoon telehealth appt.  She was instructed if she starts to develop SOB at rest or any chest pain to go to the ED for evaluation and she verbalized understanding. SChaplin, RN,BSN

## 2021-05-17 NOTE — Telephone Encounter (Signed)
Called patient and she said she doesn't have any home covid test and doesn't have anyone who can go out and purchase on for her. RN informed patient that Dr. Raliegh Ip will discuss testing options/need with her during telephone appt this afternoon. SChaplin, RN,BSN

## 2021-05-18 ENCOUNTER — Ambulatory Visit: Payer: Medicaid Other

## 2021-05-18 ENCOUNTER — Telehealth: Payer: Self-pay

## 2021-05-18 MED ORDER — NIRMATRELVIR/RITONAVIR (PAXLOVID)TABLET: 3.0000 | ORAL_TABLET | Freq: Two times a day (BID) | ORAL | 0 refills | Status: DC

## 2021-05-18 NOTE — Telephone Encounter (Signed)
Pt would like to informed the doctor she tested positive for COVID. Please call pt back.

## 2021-05-18 NOTE — Addendum Note (Signed)
Addended by: Riesa Pope on: 05/18/2021 05:38 PM   Modules accepted: Orders

## 2021-05-18 NOTE — Telephone Encounter (Signed)
Will need to go through her medications and see if she'll have any interactions with paxlovid. She is past the recommended start date (within 5 days of symptoms) but because she is high risk and unvaccinated I believe she would benefit.

## 2021-05-19 MED ORDER — NIRMATRELVIR/RITONAVIR (PAXLOVID)TABLET: 3.0000 | ORAL_TABLET | Freq: Two times a day (BID) | ORAL | 0 refills | Status: DC

## 2021-05-19 MED ORDER — NIRMATRELVIR/RITONAVIR (PAXLOVID)TABLET: 3.0000 | ORAL_TABLET | Freq: Two times a day (BID) | ORAL | 0 refills | Status: AC

## 2021-05-19 NOTE — Addendum Note (Signed)
Addended by: Riesa Pope on: 05/19/2021 10:21 AM   Modules accepted: Orders

## 2021-05-19 NOTE — Addendum Note (Signed)
Addended by: Riesa Pope on: 05/19/2021 10:32 AM   Modules accepted: Orders

## 2021-05-21 NOTE — Progress Notes (Signed)
Internal Medicine Clinic Attending  Case discussed with Dr. Katsadouros  At the time of the visit.  We reviewed the resident's history and exam and pertinent patient test results.  I agree with the assessment, diagnosis, and plan of care documented in the resident's note.  

## 2021-05-24 ENCOUNTER — Other Ambulatory Visit: Payer: Medicaid Other | Admitting: *Deleted

## 2021-06-03 ENCOUNTER — Other Ambulatory Visit: Payer: Self-pay | Admitting: Infectious Disease

## 2021-06-03 DIAGNOSIS — B20 Human immunodeficiency virus [HIV] disease: Secondary | ICD-10-CM

## 2021-06-07 ENCOUNTER — Telehealth: Payer: Self-pay | Admitting: *Deleted

## 2021-06-07 NOTE — Telephone Encounter (Signed)
Approved through 06/07/2022.

## 2021-06-07 NOTE — Telephone Encounter (Signed)
PA for Emgality 120mg  started on covermymeds (key: [id number])). Pt has coverage with [employer] (PE#[id number] S). [id number]. Decision pending.

## 2021-06-09 ENCOUNTER — Other Ambulatory Visit: Payer: Self-pay

## 2021-06-09 ENCOUNTER — Ambulatory Visit: Payer: Medicaid Other | Admitting: Neurology

## 2021-06-09 DIAGNOSIS — G43719 Chronic migraine without aura, intractable, without status migrainosus: Secondary | ICD-10-CM

## 2021-06-09 NOTE — Progress Notes (Signed)
Botox- 200 units x 2 vials Lot: [id number] Expiration: 08/2023 NDC: 11173567-01   SP

## 2021-06-11 NOTE — Progress Notes (Signed)
PATIENT: [name] DOB: [birth year]  Chief Complaint  Patient presents with   Botulinum Toxin Injection     ASSESSMENT AND PLAN  [name] is a 55 y.o. female    History of HIV, under good control, CD4 was within normal limit,  History of non-Hodgkin's lymphoma, completed with chemo therapy  Her significant daily migraine headaches happened in the scenario of poorly controlled depression anxiety, chronic insomnia,  Zomig as needed, may combine it with tizanidine, Zofran, Aleve, only treat moderate to severe headaches,  Chronic migraine headaches  Emgality, Botox injection, propanolol, Prozac, as preventive medication  Zomig, Phenergan suppository, tizanidine, NSAIDs as needed for abortive treatment Botox injection for chronic migraine prevention, injection was performed according to [place] protocol,  5 units of Botox was injected into each side, for 31 injection sites, total of 155 units  Bilateral frontalis 4 injection sites Bilateral corrugate 2 injection sites Procerus 1 injection sites. Bilateral temporalis 8 injection sites Bilateral occipitalis 6 injection sites Bilateral cervical paraspinals 4 injection sites Bilateral upper trapezius 6 injection sites  Extra 45 unites were injected into bilateral levator scapular  HISTORICAL  [name], Is accompanied by her sister  She has past medical history of HIV, hyperlipidemia, migraines, depression, and non-Hodgkin lymphoma in 2005 with chemotherapy and radiation.   She has history of migraines since 19. In the last 10 years, she reports having daily migraines. She is currently taking Topamax and Zomig with relief. She takes approximately 20-25 tablets of her Zomig per month.    She has aura's that look like floating dirt particles, flashing lights, and she sees Christmas tree lights when they are severe, she has nausea, vomiting and sometimes diarrhea when they're severe. She also complains of diplopia, blurry  vision, and  photophobia. She also has had a lightning bolt sensation throughout her body.  She describes them as pulsating and throbbing  on a scale of 5-6/10 which she considers mild, when they are severe they can go as high as 10/10. They usually start in the right temporal area, but will also start on the left temporal and are worse.   Any type of smoke or MSG can precipitate a migraine. They generally last 2-9 days. She has been to the emergency department twice in the last year for migraines. She states "I just can't do this anymore I need to do something about her migraines"  She has tried and failed different preventive medications:  atenolol, verapamil, nortriptyline, Inderal, Depakote without benefit. Atenolol and verapamil caused hypotension. Depakote caused severe vomiting. Topamax causing her worsening headaches, will show tried Zonegran, Topamax ER, she denies significant improvement.  For abortive treatment, she has tried Imitrex, Maxalt with suboptimal response, Zomig as needed since to work best,  she also takes transderm scophalamine patch for nause  I started to give her BOTOX injection since May 2013, every 3 months, she responded very well,   Previous MRI brain was normal in 2013.  Emgality was started few months ago, along with Botox as preventive medication, her migraine is under much better control, she still has average 3 migraines in a week, taking Zomig, tizanidine, Phenergan suppository, NSAIDs as abortive treatment, which has been helpful,  Also complains of chronic low back pain, personally reviewed MRI lumbar in February 2020, chronic degenerative changes, L5-S1, L5 pars fracture, advanced the disc and moderate posterior element degeneration, no significant spinal lateral recess stenosis, but moderate to severe chronic bilateral L5 foraminal stenosis,  Reviewed  laboratory evaluation, diabetes under poorly controlled, A1c was 10 in September 2022, started insulin  recently, low normal B12 271, lipid profile, triglycerides 717, cholesterol 350, LDL 161,   Today she complains of intermittent tremor since 2020, diffuse neck, arm, whole body jerking episode, I was able to watch a video clip, she was sitting in sofa, whole body jerking, eye blinking, no loss of consciousness, still follow commands, sister reported that she has similar jerking episode 2, Lasting for few hours, up to 1 and half weeks, patient has difficulty sleeping during the spells, but no loss of consciousness  Update June 09, 2021 Continue hold complains of chronic frequent migraine headaches, Botox is helpful, Zomig as needed also on Inderal Emgality 120 weekly, as preventive medication  PHYSICAL EXAM   Vitals:   04/29/21 1256  BP: (!) 143/80  Pulse: 83  Weight: 288 lb (130.6 kg)  Height: _0  (1.676 m)    PHYSICAL EXAMNIATION:  Gen: NAD, conversant, well nourised, well groomed                     Cardiovascular: Regular rate rhythm, no peripheral edema, warm, nontender. Eyes: Conjunctivae clear without exudates or hemorrhage Neck: Supple, no carotid bruits. Pulmonary: Clear to auscultation bilaterally   NEUROLOGICAL EXAM:  MENTAL STATUS: Deconditioned depressed looking central obesity, Speech/Cognition: Awake, alert, normal speech, oriented to history taking and casual conversation.  CRANIAL NERVES: CN II: Visual fields are full to confrontation.  Pupils are round equal and briskly reactive to light. CN III, IV, VI: extraocular movement are normal. No ptosis. CN V: Facial sensation is intact to light touch. CN VII: Face is symmetric with normal eye closure and smile. CN VIII: Hearing is normal to casual conversation CN IX, X: Palate elevates symmetrically. Phonation is normal. CN XI: Head turning and shoulder shrug are intact CN XII: Tongue is midline with normal movements and no atrophy.  MOTOR: Muscle bulk and tone are normal. Muscle strength is  normal.  REFLEXES: Reflexes are 2  and symmetric at the biceps, triceps, knees and ankles. Plantar responses are flexor.  SENSORY: Intact to light touch, pinprick, positional and vibratory sensation at fingers and toes.  COORDINATION: There is no trunk or limb ataxia.    GAIT/STANCE: Posture is normal. Gait is steady with normal steps, base, arm swing and turning.     REVIEW OF SYSTEMS: Full 14 system review of systems performed and notable only for as above  ALLERGIES: Allergies  Allergen Reactions   Divalproex Sodium Nausea And Vomiting    Causes light-headedness   Gabapentin Nausea And Vomiting    confusion   Ondansetron Hcl Nausea And Vomiting    Triggers migraines, "makes me vomit"   Pineapple    Propoxyphene N-Acetaminophen Nausea And Vomiting   Simvastatin     PATIENT CANNOT BE PRESCRIBED THIS STATIN WHILE RECEIVING NORVIR   Zocor [Simvastatin - High Dose]    Penicillins     REACTION: lips swollen to point of bleeding, sever vaginal irritation  Reports tolerating augmentin well without any complaints or reaction    HOME MEDICATIONS: Current Outpatient Medications  Medication Sig Dispense Refill   albuterol (PROAIR HFA) 108 (90 Base) MCG/ACT inhaler USE 1 TO 2 PUFFS EVERY 4-6 HOURS AS NEEDED FOR SHORTNESS OF BREATH ORWHEEZING 8.5 g 1   Beclomethasone Dipropionate 80 MCG/ACT AERS Place 2 Inhalers into the nose daily. 8.7 g 10   blood glucose meter kit and supplies Dispense based on patient and insurance  preference. Use up to four times daily as directed. (FOR ICD-9 250.00, 250.01). 1 each 0   botulinum toxin Type A (BOTOX) 100 units SOLR injection INJECT UP TO 200 UNITS  INTRAMUSCULARLY EVERY 90  DAYS (DISCARD UNUSED AFTER  FIRST USE) 2 each 3   Calcium Carbonate-Vitamin D (CALCIUM-VITAMIN D) 500-200 MG-UNIT per tablet Take 2 tablets by mouth daily.      diclofenac Sodium (VOLTAREN) 1 % GEL APPLY TOPICALLY TO AFFECTED AREA 4 GRAMS4 TIMES DAILY 100 g 11   FARXIGA 10  MG TABS tablet TAKE 1 TABLET BY MOUTH EVERY DAY 90 tablet 3   fluconazole (DIFLUCAN) 100 MG tablet TAKE 1 TABLET BY MOUTH EVERY DAY 14 tablet 3   FLUoxetine (PROZAC) 20 MG capsule TAKE 1 CAPSULE BY MOUTH EVERY DAY 30 capsule 2   Galcanezumab-gnlm (EMGALITY) 120 MG/ML SOAJ 128m two consecutive injection as loading dose, then 122mmonthly 1.12 mL 12   glucose blood test strip Use as instructed 100 each 12   guaiFENesin-Codeine 100-6.3 MG/5ML SOLN Take 15 mLs by mouth at bedtime as needed. 473 mL 0   ibuprofen (ADVIL) 800 MG tablet TAKE 1 TABLET BY MOUTH EVERY 8 HOURS AS NEEDED 30 tablet 5   liraglutide (VICTOZA) 18 MG/3ML SOPN Inject 0.6 mg daily for 1 week, then increase to 1.2 mg daily 3 pen 0   lisinopril (ZESTRIL) 20 MG tablet TAKE 1 TABLET BY MOUTH EVERY DAY 90 tablet 1   metroNIDAZOLE (METROGEL) 0.75 % gel Apply 1 application topically 2 (two) times daily.     mometasone-formoterol (DULERA) 100-5 MCG/ACT AERO USE 2 PUFFS BY MOUTH THE FIRST THING IN THE MORNING AND THEN ANOTHER 2 PUFFS ABOUT 12 HOURS LATER 13 g 5   pantoprazole (PROTONIX) 40 MG tablet TAKE 1 TABLET BY MOUTH 2 TIMES A DAY FORACID REFLUX 60 tablet 4   Plecanatide (TRULANCE) 3 MG TABS Take 1 tablet by mouth daily. 30 tablet 0   prochlorperazine (COMPAZINE) 5 MG tablet Take 1 tablet (5 mg total) by mouth every 6 (six) hours as needed for up to 5 days for nausea or vomiting. 10 tablet 0   promethazine (PHENERGAN) 25 MG suppository PLACE 1 SUPPOSITORY RECTALLY EVERY 6 HOURS AS NEEDED FOR NAUSEA OR VOMITING 12 each 11   propranolol (INDERAL) 20 MG tablet TAKE 1 TABLET BY MOUTH 2 TIMES A DAY 180 tablet 3   rosuvastatin (CRESTOR) 20 MG tablet Take 1 tablet (20 mg total) by mouth daily. 90 tablet 1   SYMTUZA 800-150-200-10 MG TABS TAKE 1 TABLET BY MOUTH EVERY MORNING WITH BREAKFAST 30 tablet 0   Tamsulosin HCl (FLOMAX) 0.4 MG CAPS Take 0.4 mg by mouth daily. 1-2 daily     terconazole (TERAZOL 7) 0.4 % vaginal cream PLACE 1  APPLICATORFUL VAGINALLY AT BEDTIME 45 g 3   tiZANidine (ZANAFLEX) 4 MG tablet TAKE 1 TABLET BY MOUTH EVERY 8 HOURS AS NEEDED FOR MUSCLE SPASMS 90 tablet 11   vitamin B-12 (CYANOCOBALAMIN) 1000 MCG tablet Take 1,000 mcg by mouth daily.     zolmitriptan (ZOMIG) 5 MG tablet TAKE 1 TABLET BY MOUTH AT ONSET OF HEADACHE & MAY REPEAT ONCE IN 2 HOURS IFNEEDED (MAX DOSE OF 2 TABS IN 24 HOURS) 12 tablet 11   zolpidem (AMBIEN) 10 MG tablet TAKE 1 TABLET BY MOUTH AT BEDTIME AS NEEDED FOR SLEEP 30 tablet 5   No current facility-administered medications for this visit.    PAST MEDICAL HISTORY: Past Medical History:  Diagnosis Date  Acute gastritis without mention of hemorrhage 01/01/2002   Anemia 10/2009   macrocytic anemia with baseline MCV 104-106   Anxiety    Asthma    Boil 01/05/2017   Cellulitis 05/20/2020   Chondromalacia of right knee 04/20/2012   Complex tear of medial meniscus of left knee as current injury 7/67/2094   Complication of anesthesia    hard to wake up   COPD (chronic obstructive pulmonary disease) (HCC)    chronic bronchitis   Derangement of anterior horn of lateral meniscus of left knee 02/25/2016   Duodenitis without mention of hemorrhage 01/01/2002   DVT (deep venous thrombosis) (HCC)    history clot lt groin when she had lymphoma-in remision now.   Elevated triglycerides with high cholesterol 01/05/2017   Gastroparesis    GERD (gastroesophageal reflux disease)    Hiatal hernia 01/01/2002   HIV infection ([facility])    CD4 = 570 (05/2010), VL undetectation   Hyperlipidemia    IBS (irritable bowel syndrome)    Migraines    on topamax and triptans, frequent (almost daily) attacks    MRSA (methicillin resistant staph aureus) culture positive 12/20/2010   Non Hodgkin's lymphoma ([place])    stage II, s/p resection, chemotherapy, radiotherapy, Dr. Beryle Beams is her oncologist.    PUD (peptic ulcer disease) 07/1997   per EGD report 07/1997 with history of esophagitis   Restless leg  syndrome 12/24/2015   Shingles    in lumbar dermatome   Spondylolisthesis of lumbar region    L5-S1   TMJ (dislocation of temporomandibular joint)    Traumatic tear of lateral meniscus of right knee 04/20/2012   Vaginal yeast infection 09/25/2017    PAST SURGICAL HISTORY: Past Surgical History:  Procedure Laterality Date   CHOLECYSTECTOMY     ENDOMETRIAL ABLATION     KNEE ARTHROSCOPY Right 04/20/12   KNEE ARTHROSCOPY WITH LATERAL MENISECTOMY Left 02/25/2016   Procedure: LEFT KNEE ARTHROSCOPY WITH MEDIAL AND  LATERAL MENISECTOMIES;  Surgeon: Marchia Bond, MD;  Location: [facility];  Service: Orthopedics;  Laterality: Left;   KNEE ARTHROSCOPY WITH MEDIAL MENISECTOMY Left 02/25/2016   Procedure: KNEE ARTHROSCOPY WITH MEDIAL MENISECTOMY;  Surgeon: Marchia Bond, MD;  Location: [facility];  Service: Orthopedics;  Laterality: Left;   TONSILLECTOMY     TYMPANOPLASTY Left    TYMPANOSTOMY TUBE PLACEMENT     as child-both   UPPER GASTROINTESTINAL ENDOSCOPY  2003, 2007   done by Dr. Velora Heckler    FAMILY HISTORY: Family History  Problem Relation Age of Onset   Hypertension Mother    Stroke Mother    Heart disease Mother    Diabetes Mother    Colon polyps Mother    Breast cancer Paternal Grandmother    Kidney cancer Maternal Uncle    Heart disease Maternal Uncle    Diabetes Maternal Uncle    Diabetes Maternal Grandmother    Heart disease Maternal Grandmother    Emphysema Paternal Uncle        never smoker   Emphysema Paternal Grandfather        never smoker   Colon cancer Paternal Aunt        with met to lung    Non-Hodgkin's lymphoma Maternal Uncle    Heart disease Maternal Grandfather     SOCIAL HISTORY:  Social History   Socioeconomic History   Marital status: Divorced    Spouse name: Not on file   Number of children: 0   Years of education: 33  Highest education level: Not on file  Occupational History   Occupation: disabled    Employer:  UNEMPLOYED  Tobacco Use   Smoking status: Never   Smokeless tobacco: Never  Vaping Use   Vaping Use: Never used  Substance and Sexual Activity   Alcohol use: No    Alcohol/week: 0.0 standard drinks   Drug use: No   Sexual activity: Not Currently    Partners: Male    Birth control/protection: Surgical  Other Topics Concern   Not on file  Social History Narrative      Patient is disabled.   Right handed.   Caffeine - Diet soda., tea   Education - high school   Social Determinants of Health   Financial Resource Strain: Not on file  Food Insecurity: Not on file  Transportation Needs: Not on file  Physical Activity: Not on file  Stress: Not on file  Social Connections: Not on file  Intimate Partner Violence: Not on file       Marcial Pacas, M.D. Ph.D.  [facility] [place], [place] [address] Phone: [phone] Fax:      [phone]

## 2021-06-14 ENCOUNTER — Other Ambulatory Visit: Payer: Medicaid Other | Admitting: *Deleted

## 2021-06-21 ENCOUNTER — Telehealth: Payer: Self-pay

## 2021-06-21 ENCOUNTER — Other Ambulatory Visit: Payer: Medicaid Other | Admitting: *Deleted

## 2021-06-21 NOTE — Telephone Encounter (Signed)
You ordered an EEG for this patient on 04/29/2021. She has cancelled and rescheduled this appt 6 times now. 3 of the 5 cancelled appts have been "No show/cancelled same day as appt". Our protocol states she gets 3 within the year. How would you like to proceed?

## 2021-06-22 DIAGNOSIS — E785 Hyperlipidemia, unspecified: Secondary | ICD-10-CM | POA: Diagnosis not present

## 2021-06-22 DIAGNOSIS — Z794 Long term (current) use of insulin: Secondary | ICD-10-CM | POA: Diagnosis not present

## 2021-06-22 DIAGNOSIS — Z7985 Long-term (current) use of injectable non-insulin antidiabetic drugs: Secondary | ICD-10-CM | POA: Diagnosis not present

## 2021-06-22 DIAGNOSIS — Z7984 Long term (current) use of oral hypoglycemic drugs: Secondary | ICD-10-CM | POA: Diagnosis not present

## 2021-06-22 DIAGNOSIS — E1165 Type 2 diabetes mellitus with hyperglycemia: Secondary | ICD-10-CM | POA: Diagnosis not present

## 2021-06-23 ENCOUNTER — Ambulatory Visit: Payer: Medicaid Other

## 2021-06-29 NOTE — Telephone Encounter (Signed)
She showed up everytime for her BOTOX injection, just cancel her EEG order, do not reschedule her eeg anymore

## 2021-06-30 ENCOUNTER — Other Ambulatory Visit: Payer: Self-pay | Admitting: Infectious Disease

## 2021-06-30 ENCOUNTER — Other Ambulatory Visit: Payer: Self-pay

## 2021-06-30 ENCOUNTER — Other Ambulatory Visit: Payer: Self-pay | Admitting: Obstetrics

## 2021-06-30 ENCOUNTER — Ambulatory Visit: Payer: Medicaid Other | Admitting: Neurology

## 2021-06-30 DIAGNOSIS — R569 Unspecified convulsions: Secondary | ICD-10-CM | POA: Diagnosis not present

## 2021-06-30 DIAGNOSIS — G43719 Chronic migraine without aura, intractable, without status migrainosus: Secondary | ICD-10-CM

## 2021-06-30 DIAGNOSIS — B3731 Acute candidiasis of vulva and vagina: Secondary | ICD-10-CM

## 2021-06-30 DIAGNOSIS — E1142 Type 2 diabetes mellitus with diabetic polyneuropathy: Secondary | ICD-10-CM

## 2021-06-30 DIAGNOSIS — B20 Human immunodeficiency virus [HIV] disease: Secondary | ICD-10-CM

## 2021-06-30 NOTE — Telephone Encounter (Signed)
Patient overdue for appointment. Called to schedule, no answer. Left HIPAA compliant voicemail requesting callback.   Beryle Flock, RN

## 2021-07-02 NOTE — Telephone Encounter (Signed)
Left another voice message to return office call and schedule f/u visit

## 2021-07-06 NOTE — Telephone Encounter (Signed)
Left HIPPA compliant voice mail to return call to [facility] provider.

## 2021-07-07 NOTE — Procedures (Signed)
° °  HISTORY: 56 year old female, with history of intermittent body tremor  TECHNIQUE:  This is a routine 16 channel EEG recording with one channel devoted to a limited EKG recording.  It was performed during wakefulness, drowsiness and asleep.  Hyperventilation and photic stimulation were performed as activating procedures.  There are minimum muscle and movement artifact noted.  Upon maximum arousal, posterior dominant waking rhythm consistent of rhythmic alpha range activity, with frequency of 8Hz . Activities are symmetric over the bilateral posterior derivations and attenuated with eye opening.  Hyperventilation produced mild/moderate buildup with higher amplitude and the slower activities noted.  Photic stimulation did not alter the tracing.  During EEG recording, patient developed drowsiness and no deeper stage of sleep was achieved  During EEG recording, there was no epileptiform discharge noted.  EKG demonstrate sinus rhythm, with heart rate of 72 bpm  CONCLUSION: This is a  normal awake EEG.  There is no electrodiagnostic evidence of epileptiform discharge.  Marcial Pacas, M.D. Ph.D.  [facility] [place], [place] [address] Phone: [phone] Fax:      [phone]

## 2021-07-20 ENCOUNTER — Ambulatory Visit: Payer: Medicaid Other | Admitting: Infectious Diseases

## 2021-07-22 ENCOUNTER — Ambulatory Visit
Admission: RE | Admit: 2021-07-22 | Discharge: 2021-07-22 | Disposition: A | Discharge status: Home / Self Care | Payer: Medicaid Other | Source: Ambulatory Visit | Attending: Obstetrics | Admitting: Obstetrics

## 2021-07-22 DIAGNOSIS — Z1231 Encounter for screening mammogram for malignant neoplasm of breast: Secondary | ICD-10-CM | POA: Diagnosis not present

## 2021-07-26 ENCOUNTER — Ambulatory Visit: Payer: Medicaid Other | Admitting: Infectious Disease

## 2021-07-27 ENCOUNTER — Other Ambulatory Visit: Payer: Self-pay | Admitting: Internal Medicine

## 2021-07-27 ENCOUNTER — Other Ambulatory Visit: Payer: Self-pay | Admitting: Infectious Disease

## 2021-07-27 ENCOUNTER — Other Ambulatory Visit: Payer: Self-pay | Admitting: Neurology

## 2021-07-27 ENCOUNTER — Other Ambulatory Visit: Payer: Self-pay | Admitting: Student

## 2021-07-27 DIAGNOSIS — F3341 Major depressive disorder, recurrent, in partial remission: Secondary | ICD-10-CM

## 2021-07-27 DIAGNOSIS — B20 Human immunodeficiency virus [HIV] disease: Secondary | ICD-10-CM

## 2021-08-25 ENCOUNTER — Other Ambulatory Visit: Payer: Self-pay | Admitting: Internal Medicine

## 2021-08-25 DIAGNOSIS — G4700 Insomnia, unspecified: Secondary | ICD-10-CM

## 2021-08-30 ENCOUNTER — Telehealth: Payer: Self-pay | Admitting: Neurology

## 2021-08-30 MED ORDER — BOTOX 100 UNITS IJ SOLR: INTRAMUSCULAR | 3 refills | Status: DC

## 2021-08-30 NOTE — Telephone Encounter (Signed)
Please send Botox refill to [facility]. °

## 2021-08-30 NOTE — Telephone Encounter (Signed)
Refills sent to pharmacy. 

## 2021-09-01 ENCOUNTER — Ambulatory Visit (INDEPENDENT_AMBULATORY_CARE_PROVIDER_SITE_OTHER): Payer: Medicaid Other

## 2021-09-01 ENCOUNTER — Other Ambulatory Visit: Payer: Self-pay | Admitting: Student

## 2021-09-01 ENCOUNTER — Other Ambulatory Visit: Payer: Self-pay

## 2021-09-01 ENCOUNTER — Other Ambulatory Visit (HOSPITAL_COMMUNITY): Payer: Self-pay

## 2021-09-01 ENCOUNTER — Encounter: Payer: Self-pay | Admitting: Infectious Disease

## 2021-09-01 ENCOUNTER — Ambulatory Visit (INDEPENDENT_AMBULATORY_CARE_PROVIDER_SITE_OTHER): Payer: Medicaid Other | Admitting: Infectious Disease

## 2021-09-01 VITALS — BP 108/71 | HR 75 | Temp 98.2°F | Ht 66.0 in | Wt 283.0 lb

## 2021-09-01 DIAGNOSIS — Z23 Encounter for immunization: Secondary | ICD-10-CM | POA: Diagnosis not present

## 2021-09-01 DIAGNOSIS — F3341 Major depressive disorder, recurrent, in partial remission: Secondary | ICD-10-CM

## 2021-09-01 DIAGNOSIS — G4700 Insomnia, unspecified: Secondary | ICD-10-CM

## 2021-09-01 DIAGNOSIS — B20 Human immunodeficiency virus [HIV] disease: Secondary | ICD-10-CM | POA: Diagnosis not present

## 2021-09-01 DIAGNOSIS — E1142 Type 2 diabetes mellitus with diabetic polyneuropathy: Secondary | ICD-10-CM | POA: Diagnosis not present

## 2021-09-01 DIAGNOSIS — G43719 Chronic migraine without aura, intractable, without status migrainosus: Secondary | ICD-10-CM | POA: Diagnosis not present

## 2021-09-01 DIAGNOSIS — L539 Erythematous condition, unspecified: Secondary | ICD-10-CM | POA: Diagnosis not present

## 2021-09-01 DIAGNOSIS — R198 Other specified symptoms and signs involving the digestive system and abdomen: Secondary | ICD-10-CM | POA: Diagnosis not present

## 2021-09-01 HISTORY — DX: Other specified symptoms and signs involving the digestive system and abdomen: R19.8

## 2021-09-01 HISTORY — DX: Erythematous condition, unspecified: L53.9

## 2021-09-01 MED ORDER — SYMTUZA 800-150-200-10 MG PO TABS: 1.0000 | ORAL_TABLET | Freq: Every day | ORAL | 11 refills | Status: DC

## 2021-09-01 NOTE — Progress Notes (Signed)
Chief complaints: bleeding from umbilicus x nearly 2 months  Subjective:    Patient ID: [name], female    DOB: Apr 15, 1966, 56 y.o.   MRN: [id number]  HPI  [name] is a 56 y.o. female who is doing superbly well on her  antiviral regimen, SYMTUZA with undetectable viral load and health cd4 count.   Comorbid diabetes mellitus morbid obesity degenerative disc disease and osteoarthritis.  Since I last saw [name] nearly 2 years ago she recently succumbed to COVID-19 infection.  Note she had not been vaccinated she also had another respiratory virus after that.  She has had bleeding from her umbilicus for the last 2 months.  At times there is frank blood that comes from this area and at other times other types of discharge on exam it is not erythematous at present moment time.  She does not recall any trauma to the area she has tried different antimicrobial topical agents as well as antifungals without any improvement.  Also suffering from worsening tremors that are being worked up by neurology.    Past Medical History:  Diagnosis Date   Acute gastritis without mention of hemorrhage 01/01/2002   Anemia 10/2009   macrocytic anemia with baseline MCV 104-106   Anxiety    Asthma    Boil 01/05/2017   Cellulitis 05/20/2020   Chondromalacia of right knee 04/20/2012   Complex tear of medial meniscus of left knee as current injury 10/10/8117   Complication of anesthesia    hard to wake up   COPD (chronic obstructive pulmonary disease) (HCC)    chronic bronchitis   Derangement of anterior horn of lateral meniscus of left knee 02/25/2016   Duodenitis without mention of hemorrhage 01/01/2002   DVT (deep venous thrombosis) (HCC)    history clot lt groin when she had lymphoma-in remision now.   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 Beryle Beams is her oncologist.    PUD (peptic ulcer disease) 07/1997   per EGD report 07/1997 with history of esophagitis   Restless leg syndrome 12/24/2015   Shingles    in lumbar dermatome   Spondylolisthesis of lumbar region    L5-S1   TMJ (dislocation of temporomandibular joint)    Traumatic tear of lateral meniscus of right knee 04/20/2012   Vaginal yeast infection 09/25/2017   Past Surgical History:  Procedure Laterality Date   CHOLECYSTECTOMY     ENDOMETRIAL ABLATION     KNEE ARTHROSCOPY Right 04/20/12   KNEE ARTHROSCOPY WITH LATERAL MENISECTOMY Left 02/25/2016   Procedure: LEFT KNEE ARTHROSCOPY WITH MEDIAL AND  LATERAL MENISECTOMIES;  Surgeon: Marchia Bond, MD;  Location: [facility];  Service: Orthopedics;  Laterality: Left;   KNEE ARTHROSCOPY WITH MEDIAL MENISECTOMY Left 02/25/2016   Procedure: KNEE ARTHROSCOPY WITH MEDIAL MENISECTOMY;  Surgeon: Marchia Bond, MD;  Location: [facility];  Service: Orthopedics;  Laterality: Left;   TONSILLECTOMY     TYMPANOPLASTY Left    TYMPANOSTOMY TUBE PLACEMENT     as child-both  UPPER GASTROINTESTINAL ENDOSCOPY  2003, 2007   done by Dr. Velora Heckler   Family History  Problem Relation Age of Onset   Hypertension Mother    Stroke Mother    Heart disease Mother    Diabetes Mother    Colon polyps Mother    Breast cancer Paternal Grandmother    Kidney cancer Maternal Uncle    Heart disease Maternal Uncle    Diabetes Maternal Uncle    Diabetes Maternal Grandmother    Heart disease Maternal Grandmother    Emphysema Paternal Uncle        never smoker   Emphysema Paternal Grandfather        never smoker   Colon cancer Paternal  Aunt        with met to lung    Non-Hodgkin's lymphoma Maternal Uncle    Heart disease Maternal Grandfather    Social History   Tobacco Use   Smoking status: Never   Smokeless tobacco: Never  Vaping Use   Vaping Use: Never used  Substance Use Topics   Alcohol use: No    Alcohol/week: 0.0 standard drinks   Drug use: No   Allergies  Allergen Reactions   Divalproex Sodium Nausea And Vomiting    Causes light-headedness   Gabapentin Nausea And Vomiting    confusion   Ondansetron Hcl Nausea And Vomiting    Triggers migraines, "makes me vomit"   Pineapple    Propoxyphene N-Acetaminophen Nausea And Vomiting   Simvastatin     PATIENT CANNOT BE PRESCRIBED THIS STATIN WHILE RECEIVING NORVIR   Zocor [Simvastatin - High Dose]    Penicillins     REACTION: lips swollen to point of bleeding, sever vaginal irritation  Reports tolerating augmentin well without any complaints or reaction     Current Outpatient Medications:    albuterol (PROAIR HFA) 108 (90 Base) MCG/ACT inhaler, USE 1 TO 2 PUFFS EVERY 4-6 HOURS AS NEEDED FOR SHORTNESS OF BREATH ORWHEEZING, Disp: 8.5 g, Rfl: 1   Beclomethasone Dipropionate 80 MCG/ACT AERS, Place 2 Inhalers into the nose daily., Disp: 8.7 g, Rfl: 10   blood glucose meter kit and supplies, Dispense based on patient and insurance preference. Use up to four times daily as directed. (FOR ICD-9 250.00, 250.01)., Disp: 1 each, Rfl: 0   botulinum toxin Type A (BOTOX) 100 units SOLR injection, INJECT UP TO 200 UNITS  INTRAMUSCULARLY EVERY 90  DAYS (DISCARD UNUSED AFTER  FIRST USE), Disp: 2 each, Rfl: 3   Calcium Carbonate-Vitamin D (CALCIUM-VITAMIN D) 500-200 MG-UNIT per tablet, Take 2 tablets by mouth daily. , Disp: , Rfl:    diclofenac Sodium (VOLTAREN) 1 % GEL, APPLY TOPICALLY TO AFFECTED AREA 4 GRAMS4 TIMES DAILY, Disp: 100 g, Rfl: 11   FARXIGA 10 MG TABS tablet, TAKE 1 TABLET BY MOUTH EVERY DAY, Disp: 90 tablet, Rfl: 3   fluconazole (DIFLUCAN) 100 MG tablet,  Take 1 tablet (100 mg total) by mouth daily., Disp: 14 tablet, Rfl: 3   FLUoxetine (PROZAC) 20 MG capsule, TAKE 1 CAPSULE BY MOUTH EVERY DAY, Disp: 30 capsule, Rfl: 2   Galcanezumab-gnlm (EMGALITY) 120 MG/ML SOAJ, 12m two consecutive injection as loading dose, then 1276mmonthly, Disp: 1.12 mL, Rfl: 12   glucose blood test strip, Use as instructed, Disp: 100 each, Rfl: 12   ibuprofen (ADVIL) 800 MG tablet, TAKE 1 TABLET BY MOUTH EVERY 8 HOURS AS NEEDED, Disp: 30 tablet, Rfl: 5   lisinopril (ZESTRIL) 20 MG tablet, TAKE 1 TABLET BY MOUTH EVERY  DAY, Disp: 90 tablet, Rfl: 1   metroNIDAZOLE (METROGEL) 0.75 % gel, Apply 1 application topically 2 (two) times daily., Disp: , Rfl:    mometasone-formoterol (DULERA) 100-5 MCG/ACT AERO, USE 2 PUFFS BY MOUTH THE FIRST THING IN THE MORNING AND THEN ANOTHER 2 PUFFS ABOUT 12 HOURS LATER, Disp: 13 g, Rfl: 5   pantoprazole (PROTONIX) 40 MG tablet, TAKE 1 TABLET BY MOUTH 2 TIMES A DAY FORACID REFLUX, Disp: 60 tablet, Rfl: 4   Plecanatide (TRULANCE) 3 MG TABS, Take 1 tablet by mouth daily., Disp: 30 tablet, Rfl: 0   prochlorperazine (COMPAZINE) 5 MG tablet, Take 1 tablet (5 mg total) by mouth every 6 (six) hours as needed for up to 5 days for nausea or vomiting., Disp: 10 tablet, Rfl: 0   promethazine (PHENERGAN) 25 MG suppository, UNWRAP & INSERT 1 SUPPOSITORY RECTALLY EVERY 6 HOURS AS NEEDED FOR NAUSEA & VOMITING, Disp: 12 each, Rfl: 11   propranolol (INDERAL) 20 MG tablet, TAKE 1 TABLET BY MOUTH 2 TIMES A DAY, Disp: 180 tablet, Rfl: 3   rosuvastatin (CRESTOR) 20 MG tablet, TAKE 1 TABLET BY MOUTH EVERY DAY FOR CHOLESTEROL, Disp: 90 tablet, Rfl: 3   SYMTUZA 800-150-200-10 MG TABS, TAKE 1 TABLET BY MOUTH EVERY MORNING WITH BREAKFAST, Disp: 30 tablet, Rfl: 1   Tamsulosin HCl (FLOMAX) 0.4 MG CAPS, Take 0.4 mg by mouth daily. 1-2 daily, Disp: , Rfl:    terconazole (TERAZOL 7) 0.4 % vaginal cream, PLACE 1 APPLICATORFUL VAGINALLY AT BEDTIME, Disp: 45 g, Rfl: 3    tiZANidine (ZANAFLEX) 4 MG tablet, TAKE 1 TABLET BY MOUTH EVERY 8 HOURS AS NEEDED FOR MUSCLE SPASMS, Disp: 90 tablet, Rfl: 11   vitamin B-12 (CYANOCOBALAMIN) 1000 MCG tablet, Take 1,000 mcg by mouth daily., Disp: , Rfl:    zolmitriptan (ZOMIG) 5 MG tablet, TAKE 1 TABLET BY MOUTH AT ONSET OF HEADACHE & MAY REPEAT ONCE IN 2 HOURS IFNEEDED (MAX DOSE OF 2 TABS IN 24 HOURS), Disp: 12 tablet, Rfl: 11   zolpidem (AMBIEN) 10 MG tablet, TAKE 1 TABLET BY MOUTH AT BEDTIME AS NEEDED FOR SLEEP, Disp: 30 tablet, Rfl: 0   guaiFENesin-Codeine 100-6.3 MG/5ML SOLN, Take 15 mLs by mouth at bedtime as needed. (Patient not taking: Reported on 09/01/2021), Disp: 473 mL, Rfl: 0   Insulin Glargine (BASAGLAR KWIKPEN) 100 UNIT/ML, SMARTSIG:30 Unit(s) SUB-Q Daily, Disp: , Rfl:    liraglutide (VICTOZA) 18 MG/3ML SOPN, Inject 0.6 mg daily for 1 week, then increase to 1.2 mg daily (Patient not taking: Reported on 09/01/2021), Disp: 3 pen, Rfl: 0   OZEMPIC, 0.25 OR 0.5 MG/DOSE, 2 MG/1.5ML SOPN, Inject 0.5 mg into the skin once a week., Disp: , Rfl:      Review of Systems  Constitutional:  Negative for chills and fever.  HENT:  Negative for congestion and sore throat.   Eyes:  Negative for photophobia.  Respiratory:  Negative for cough, shortness of breath and wheezing.   Cardiovascular:  Negative for chest pain, palpitations and leg swelling.  Gastrointestinal:  Negative for abdominal pain, blood in stool, constipation, diarrhea, nausea and vomiting.  Genitourinary:  Negative for dysuria, flank pain and hematuria.  Musculoskeletal:  Negative for back pain and myalgias.  Skin:  Negative for rash.  Neurological:  Positive for tremors and headaches. Negative for dizziness and weakness.  Hematological:  Does not bruise/bleed easily.  Psychiatric/Behavioral:  Positive for dysphoric mood. Negative for agitation, behavioral problems, confusion, decreased concentration, hallucinations and suicidal ideas. The patient is not  hyperactive.       Objective:   Physical Exam Constitutional:      Appearance: Normal appearance. She is obese.  Eyes:     Extraocular Movements: Extraocular movements intact.  Cardiovascular:     Rate and Rhythm: Normal rate.  Pulmonary:     Effort: Pulmonary effort is normal. No respiratory distress.  Abdominal:     General: There is no distension.  Skin:    General: Skin is warm and dry.  Neurological:     General: No focal deficit present.     Mental Status: She is alert and oriented to person, place, and time.  Psychiatric:        Mood and Affect: Mood normal.        Behavior: Behavior normal.        Thought Content: Thought content normal.        Judgment: Judgment normal.    Umbilicus September 02, 5367:  Not frankly erythematous around the area not especially tender although there is some erythema within the umbilical area itself.      Assessment & Plan:   Pain and bleeding from the umbilicus:  Ordering an ultrasound of the abdomen of this area will likely refer to surgery for further evaluation.  HIV disease:  Check HIV viral load CD4 count CBC CMP with differential.  We will continue her SYMTUZA.  Hyperlipidemia she is continue on Crestor  Hypertension: Continuing on her ACE and some better [place].  Depressive symptoms: She is currently on SSRI but feels is not helping much.  From her migraines: Being managed by neurology.  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  She asked whether she ultrasound this as well but I recommended we first focused on the area and umbilicus since the arm area is chronic.  Recommend she follow-up with PCP for further consideration also recommended the idea of a psychiatrist though her friend who was in the room was very much dismissive of their being helpful.  Vaccine counseling gave her bivalent COVID 19 vaccine since she had recent COVID and I see no point in redirecting her immune system towards 2020 version of Spike  protein and recommended flu shot which she received today.

## 2021-09-01 NOTE — Progress Notes (Signed)
? ?  Covid-19 Vaccination Clinic ? ?Name:  [name]    ?MRN: [id number] ?DOB: 1965/07/23 ? ?09/01/2021 ? ?[name] was observed post Covid-19 immunization for 15 minutes without incident. She was provided with Vaccine Information Sheet and instruction to access the V-Safe system. [name] declined the primary Covid vaccine series. She consented to receive the bivalent booster per Dr. Lucianne Lei Dam's recommendation. ? ?Ms. Hoeschen was instructed to call 911 with any severe reactions post vaccine: ?Difficulty breathing  ?Swelling of face and throat  ?A fast heartbeat  ?A bad rash all over body  ?Dizziness and weakness  ? ?Immunizations Administered   ? ? Name Date Dose VIS Date Route  ? Pension scheme manager 09/01/2021  3:18 PM 0.3 mL 03/03/2021 Intramuscular  ? Manufacturer: [place]: [phone]  ? [place]: [phone]  ? ?  ?  ?Binnie Kand, RN  ?

## 2021-09-02 NOTE — Telephone Encounter (Signed)
Received 2 100 units vials of Botox from [facility]. ?

## 2021-09-03 LAB — CBC WITH DIFFERENTIAL/PLATELET
Absolute Monocytes: 342 cells/uL (ref 200–950)
Basophils Absolute: 29 cells/uL (ref 0–200)
Basophils Relative: 0.5 %
Eosinophils Absolute: 168 cells/uL (ref 15–500)
Eosinophils Relative: 2.9 %
HCT: 46 % — ABNORMAL HIGH (ref 35.0–45.0)
Hemoglobin: 14.7 g/dL (ref 11.7–15.5)
Lymphs Abs: 2535 cells/uL (ref 850–3900)
MCH: 27 pg (ref 27.0–33.0)
MCHC: 32 g/dL (ref 32.0–36.0)
MCV: 84.6 fL (ref 80.0–100.0)
MPV: 9.6 fL (ref 7.5–12.5)
Monocytes Relative: 5.9 %
Neutro Abs: 2726 cells/uL (ref 1500–7800)
Neutrophils Relative %: 47 %
Platelets: 257 10*3/uL (ref 140–400)
RBC: 5.44 10*6/uL — ABNORMAL HIGH (ref 3.80–5.10)
RDW: 15.8 % — ABNORMAL HIGH (ref 11.0–15.0)
Total Lymphocyte: 43.7 %
WBC: 5.8 10*3/uL (ref 3.8–10.8)

## 2021-09-03 LAB — LIPID PANEL
Cholesterol: 213 mg/dL — ABNORMAL HIGH (ref <200)
HDL: 35 mg/dL — ABNORMAL LOW (ref >=50)
Non-HDL Cholesterol (Calc): 178 mg/dL (calc) — ABNORMAL HIGH (ref <130)
Total CHOL/HDL Ratio: 6.1 (calc) — ABNORMAL HIGH (ref <5.0)
Triglycerides: 585 mg/dL — ABNORMAL HIGH (ref <150)

## 2021-09-03 LAB — COMPLETE METABOLIC PANEL WITH GFR
AG Ratio: 1.5 (calc) (ref 1.0–2.5)
ALT: 12 U/L (ref 6–29)
AST: 17 U/L (ref 10–35)
Albumin: 4.5 g/dL (ref 3.6–5.1)
Alkaline phosphatase (APISO): 56 U/L (ref 37–153)
BUN: 21 mg/dL (ref 7–25)
CO2: 27 mmol/L (ref 20–32)
Calcium: 9.9 mg/dL (ref 8.6–10.4)
Chloride: 98 mmol/L (ref 98–110)
Creat: 0.98 mg/dL (ref 0.50–1.03)
Globulin: 3 g/dL (calc) (ref 1.9–3.7)
Glucose, Bld: 191 mg/dL — ABNORMAL HIGH (ref 65–99)
Potassium: 5 mmol/L (ref 3.5–5.3)
Sodium: 136 mmol/L (ref 135–146)
Total Bilirubin: 0.5 mg/dL (ref 0.2–1.2)
Total Protein: 7.5 g/dL (ref 6.1–8.1)
eGFR: 69 mL/min/{1.73_m2} (ref >=60)

## 2021-09-03 LAB — T-HELPER CELLS (CD4) COUNT (NOT AT ARMC)
Absolute CD4: 1098 cells/uL (ref 490–1740)
CD4 T Helper %: 40 % (ref 30–61)
Total lymphocyte count: 2735 cells/uL (ref 850–3900)

## 2021-09-03 LAB — HIV-1 RNA QUANT-NO REFLEX-BLD
HIV 1 RNA Quant: <20 Copies/mL — ABNORMAL HIGH
HIV-1 RNA Quant, Log: <1.3 Log cps/mL — ABNORMAL HIGH

## 2021-09-03 LAB — RPR: RPR Ser Ql: NONREACTIVE

## 2021-09-07 ENCOUNTER — Ambulatory Visit: Payer: Medicaid Other | Admitting: Neurology

## 2021-09-09 ENCOUNTER — Ambulatory Visit: Payer: Medicaid Other | Admitting: Family Medicine

## 2021-09-09 ENCOUNTER — Ambulatory Visit (HOSPITAL_COMMUNITY): Payer: Medicaid Other

## 2021-09-09 DIAGNOSIS — G43711 Chronic migraine without aura, intractable, with status migrainosus: Secondary | ICD-10-CM | POA: Diagnosis not present

## 2021-09-09 NOTE — Progress Notes (Signed)
? ? ?09/09/21 ALL: [name] returns for Botox. She reports having migraines every day. She feels they are less intense since being on Botox. She thinks this is her 4th year of Botox therapy. She continues Emgality and propranolol for prevention and tizanidine, promethazine and zomitriptan for abortive therapy.  ? ? ?Consent Form ?Botulism Toxin Injection For Chronic Migraine ? ? ? ?Reviewed orally with patient, additionally signature is on file: ? ?Botulism toxin has been approved by the Federal drug administration for treatment of chronic migraine. Botulism toxin does not cure chronic migraine and it may not be effective in some patients. ? ?The administration of botulism toxin is accomplished by injecting a small amount of toxin into the muscles of the neck and head. Dosage must be titrated for each individual. Any benefits resulting from botulism toxin tend to wear off after 3 months with a repeat injection required if benefit is to be maintained. Injections are usually done every 3-4 months with maximum effect peak achieved by about 2 or 3 weeks. Botulism toxin is expensive and you should be sure of what costs you will incur resulting from the injection. ? ?The side effects of botulism toxin use for chronic migraine may include: ? ? -Transient, and usually mild, facial weakness with facial injections ? -Transient, and usually mild, head or neck weakness with head/neck injections ? -Reduction or loss of forehead facial animation due to forehead muscle weakness ? -Eyelid drooping ? -Dry eye ? -Pain at the site of injection or bruising at the site of injection ? -Double vision ? -Potential unknown long term risks ? ? ?Contraindications: You should not have Botox if you are pregnant, nursing, allergic to albumin, have an infection, skin condition, or muscle weakness at the site of the injection, or have myasthenia gravis, Lambert-Eaton syndrome, or ALS. ? ?It is also possible that as with any injection, there may be  an allergic reaction or no effect from the medication. Reduced effectiveness after repeated injections is sometimes seen and rarely infection at the injection site may occur. All care will be taken to prevent these side effects. If therapy is given over a long time, atrophy and wasting in the muscle injected may occur. Occasionally the patient's become refractory to treatment because they develop antibodies to the toxin. In this event, therapy needs to be modified. ? ?I have read the above information and consent to the administration of botulism toxin. ? ? ? ?BOTOX PROCEDURE NOTE FOR MIGRAINE HEADACHE ? ?Contraindications and precautions discussed with patient(above). Aseptic procedure was observed and patient tolerated procedure. Procedure performed by Debbora Presto, FNP-C.  ? ?The condition has existed for more than 6 months, and pt does not have a diagnosis of ALS, Myasthenia Gravis or Lambert-Eaton Syndrome.  Risks and benefits of injections discussed and pt agrees to proceed with the procedure.  Written consent obtained ? ?These injections are medically necessary. Pt  receives good benefits from these injections. These injections do not cause sedations or hallucinations which the oral therapies may cause. ? ? ?Description of procedure: ? ?The patient was placed in a sitting position. The standard protocol was used for Botox as follows, with 5 units of Botox injected at each site: ? ?-Procerus muscle, midline injection ? ?-Corrugator muscle, bilateral injection ? ?-Frontalis muscle, bilateral injection, with 2 sites each side, medial injection was performed in the upper one third of the frontalis muscle, in the region vertical from the medial inferior edge of the superior orbital rim. The lateral injection was  again in the upper one third of the forehead vertically above the lateral limbus of the cornea, 1.5 cm lateral to the medial injection site. ? ?-Temporalis muscle injection, 4 sites, bilaterally. The first  injection was 3 cm above the tragus of the ear, second injection site was 1.5 cm to 3 cm up from the first injection site in line with the tragus of the ear. The third injection site was 1.5-3 cm forward between the first 2 injection sites. The fourth injection site was 1.5 cm posterior to the second injection site. 5th site laterally in the temporalis  muscleat the level of the outer canthus. ? ?-Occipitalis muscle injection, 3 sites, bilaterally. The first injection was done one half way between the occipital protuberance and the tip of the mastoid process behind the ear. The second injection site was done lateral and superior to the first, 1 fingerbreadth from the first injection. The third injection site was 1 fingerbreadth superiorly and medially from the first injection site. ? ?-Cervical paraspinal muscle injection, 2 sites, bilaterally. The first injection site was 1 cm from the midline of the cervical spine, 3 cm inferior to the lower border of the occipital protuberance. The second injection site was 1.5 cm superiorly and laterally to the first injection site. ? ?-Trapezius muscle injection was performed at 3 sites, bilaterally. The first injection site was in the upper trapezius muscle halfway between the inflection point of the neck, and the acromion. The second injection site was one half way between the acromion and the first injection site. The third injection was done between the first injection site and the inflection point of the neck. ? ? ?Will return for repeat injection in 3 months. ? ? ?A total of 200 units of Botox was prepared, 155 units of Botox was injected as documented above, 45 units of Botox was wasted. The patient tolerated the procedure well, there were no complications of the above procedure. ? ?

## 2021-09-09 NOTE — Progress Notes (Signed)
Botox- 100 units x 2 vials ?Lot: [id number] ?Expiration: 11/2023 ?[place]: [phone] ? ?Bacteriostatic 0.9% Sodium Chloride- 47m total ?Lot: [id number]?Expiration: 02/02/2023 ?[place] [address]-28? ?[place]: [phone]?S/P ?

## 2021-09-14 ENCOUNTER — Ambulatory Visit (HOSPITAL_COMMUNITY): Payer: Medicaid Other

## 2021-09-21 ENCOUNTER — Other Ambulatory Visit: Payer: Self-pay | Admitting: Neurology

## 2021-09-21 NOTE — Telephone Encounter (Signed)
Rx refilled.

## 2021-09-22 ENCOUNTER — Other Ambulatory Visit: Payer: Self-pay

## 2021-09-22 DIAGNOSIS — G4700 Insomnia, unspecified: Secondary | ICD-10-CM

## 2021-09-22 MED ORDER — ZOLPIDEM TARTRATE 10 MG PO TABS: 10.0000 mg | ORAL_TABLET | Freq: Every evening | ORAL | 0 refills | Status: DC | PRN

## 2021-09-22 NOTE — Telephone Encounter (Signed)
zolpidem (AMBIEN) 10 MG tablet, REFILL REQUEST @ [place], [place] - [address] ?

## 2021-09-27 ENCOUNTER — Telehealth: Payer: Self-pay

## 2021-09-27 NOTE — Telephone Encounter (Signed)
Patient called requesting her lab results ?[name] T Kalden Wanke ? ?

## 2021-09-28 NOTE — Telephone Encounter (Signed)
I attempted to contact the patient to relay lab results. Patient did not answer and I left a message for her to call back ?

## 2021-09-30 ENCOUNTER — Encounter: Payer: Self-pay | Admitting: Student

## 2021-09-30 ENCOUNTER — Ambulatory Visit: Payer: Medicaid Other | Admitting: Student

## 2021-09-30 VITALS — BP 161/86 | HR 100 | Ht 66.0 in | Wt 285.6 lb

## 2021-09-30 DIAGNOSIS — I10 Essential (primary) hypertension: Secondary | ICD-10-CM

## 2021-09-30 DIAGNOSIS — R198 Other specified symptoms and signs involving the digestive system and abdomen: Secondary | ICD-10-CM

## 2021-09-30 DIAGNOSIS — E785 Hyperlipidemia, unspecified: Secondary | ICD-10-CM | POA: Diagnosis not present

## 2021-09-30 DIAGNOSIS — G43719 Chronic migraine without aura, intractable, without status migrainosus: Secondary | ICD-10-CM | POA: Diagnosis not present

## 2021-09-30 DIAGNOSIS — G4700 Insomnia, unspecified: Secondary | ICD-10-CM

## 2021-09-30 DIAGNOSIS — F3341 Major depressive disorder, recurrent, in partial remission: Secondary | ICD-10-CM | POA: Diagnosis not present

## 2021-09-30 DIAGNOSIS — E119 Type 2 diabetes mellitus without complications: Secondary | ICD-10-CM | POA: Diagnosis present

## 2021-09-30 DIAGNOSIS — R051 Acute cough: Secondary | ICD-10-CM | POA: Diagnosis not present

## 2021-09-30 DIAGNOSIS — B20 Human immunodeficiency virus [HIV] disease: Secondary | ICD-10-CM | POA: Diagnosis not present

## 2021-09-30 DIAGNOSIS — Z0000 Encounter for general adult medical examination without abnormal findings: Secondary | ICD-10-CM

## 2021-09-30 DIAGNOSIS — E782 Mixed hyperlipidemia: Secondary | ICD-10-CM | POA: Diagnosis not present

## 2021-09-30 LAB — POCT GLYCOSYLATED HEMOGLOBIN (HGB A1C): Hemoglobin A1C: 8.2 % — AB (ref 4.0–5.6)

## 2021-09-30 LAB — GLUCOSE, CAPILLARY: Glucose-Capillary: 263 mg/dL — ABNORMAL HIGH (ref 70–99)

## 2021-09-30 MED ORDER — AZELASTINE HCL 0.15 % NA SOLN: 2.0000 | Freq: Two times a day (BID) | NASAL | 2 refills | Status: DC

## 2021-09-30 MED ORDER — ZOLPIDEM TARTRATE 10 MG PO TABS: 10.0000 mg | ORAL_TABLET | Freq: Every evening | ORAL | 4 refills | Status: DC | PRN

## 2021-09-30 MED ORDER — DULOXETINE HCL 30 MG PO CPEP
30.0000 mg | ORAL_CAPSULE | Freq: Every day | ORAL | 1 refills | Status: DC
Start: 2021-09-30 — End: 2022-07-12

## 2021-09-30 MED ORDER — OLMESARTAN MEDOXOMIL 40 MG PO TABS: 40.0000 mg | ORAL_TABLET | Freq: Every day | ORAL | 11 refills | Status: DC

## 2021-09-30 NOTE — Patient Instructions (Addendum)
Thank you, [name] for allowing [place] to provide your care today. Today we discussed. ? ?Diabetes ?You have had great improvement in your A1c! Great job! Please follow up with your endocrinologist and continue your insulin, farxiga, and ozempic.  ? ?High blood pressure ?We will be switching you to olmesartan, a medicine less likely to give you a cough. please purchase a blood pressure cuff and check your pressures at home ? ?Tremors ?Please follow up with you neurologist ? ?Depression ?We will be starting you back on cymablta, please take it for 4-6 weeks. It may take a while to feel improvement because it takes a few weks to work. I have also placed a referral to Dr. Theodis May our counselor in the [facility].  ? ?High cholesterol   ?Please continue to take your rosuvastatin, we will be rechecking your cholesterol levels today ? ?Preventative Health ?Please follow with your GI [name] about colonoscopy.  ? ?HIV ?Please continue your HIV medications and follow with Dr. Tommy May ? ?Migraines ?Please follow with your migraine doctors and take your second dose of zolmitriptan 2 hours after your last dose. Do not take more than 2 in a day ? ?Sleep Medicine ?I have sent in refills for your ambien ? ?I have ordered the following labs for you: ? ? ?Lab Orders    ?     Hemoglobin A1c    ?     Glucose, capillary     ? ? ? ?Referrals ordered today:  ? ?Referral Orders  ?No referral(s) requested today  ?  ? ?I have ordered the following medication/changed the following medications:  ? ?Stop the following medications: ?There are no discontinued medications.  ? ?Start the following medications: ?No orders of the defined types were placed in this encounter. ?  ? ?Follow up: 1 month BP check ? ? ?Should you have any questions or concerns please call the internal medicine clinic at [phone].   ? ?[name], D.O. ?[place] ?  ?

## 2021-09-30 NOTE — Telephone Encounter (Signed)
Patient aware.  [name] Justinn Welter, CMA  

## 2021-10-01 ENCOUNTER — Ambulatory Visit (HOSPITAL_COMMUNITY): Admission: RE | Admit: 2021-10-01 | Payer: Medicaid Other | Source: Ambulatory Visit

## 2021-10-01 DIAGNOSIS — R051 Acute cough: Secondary | ICD-10-CM | POA: Insufficient documentation

## 2021-10-01 LAB — LIPID PANEL
Chol/HDL Ratio: 5 ratio — ABNORMAL HIGH (ref 0.0–4.4)
Cholesterol, Total: 236 mg/dL — ABNORMAL HIGH (ref 100–199)
HDL: 47 mg/dL (ref >39)
LDL Chol Calc (NIH): 112 mg/dL — ABNORMAL HIGH (ref 0–99)
Triglycerides: 443 mg/dL — ABNORMAL HIGH (ref 0–149)
VLDL Cholesterol Cal: 77 mg/dL — ABNORMAL HIGH (ref 5–40)

## 2021-10-01 NOTE — Assessment & Plan Note (Signed)
Assessment: ?Long standing history of depression and has tried multiple medications in the past without success. Started on prozac during her appointment 08/22, but has not felt that it has been helpful. She felt the most effect while on cymbalta. Will dc prozac and start cymbalta. We also discussed effect of combination of counseling/medications. She would like to try meeting with Dr. Theodis Shove. ? ?Plan: ?-switch to cymbalta 30 mg daily, reassess in 4-6 weeks ?-referral placed to [facility] ?

## 2021-10-01 NOTE — Assessment & Plan Note (Signed)
Assessment: ?Hx of insomnia, stable on ambien. In the past 5 refills had been sent in, however, she had not been evaluated by me so last script sent without refills and requested she make an appointments with me ? ?Stable and doing well, will send in refills for patient.  ? ?Plan: ?-ambien refilled 3/22, sent in additional script with refills. Pharmacy instructions to not refill until one month from prior fill date.  ?

## 2021-10-01 NOTE — Assessment & Plan Note (Signed)
Will need fasting lipid panel to determine Tg levels.  ?

## 2021-10-01 NOTE — Assessment & Plan Note (Signed)
Assessment: ?Current regimen of lisinopril 20 mg daily. Her blood pressures have fluctuated in the past, but have been for the most part well controlled and at goal. She would like to switch to a different medicine as she was reading that lisinopril can cause a cough. She has had a chronic cough 2/2 bronchitis.  ? ?Will switch from lisinopril 20 mg to olmesartan. Will up titrate as needed. She has a wrist bp cuff at home, discussed purchasing a brachial cuff if possible and recording bp's periodically and bringing in readings to follow up visit. ? ?Plan: ?-olmesartan 40 mg daily ?-recheck BP in one month ?

## 2021-10-01 NOTE — Progress Notes (Signed)
? ?CC: follow up diabetes, hypertension, hld ? ?HPI: ? ?[name] is a 56 y.o. female living with a history stated below and presents today for follow up concerning her chronic medical conditions. Please see problem based assessment and plan for additional details. ? ?Past Medical History:  ?Diagnosis Date  ? Acute gastritis without mention of hemorrhage 01/01/2002  ? Anemia 10/2009  ? macrocytic anemia with baseline MCV 104-106  ? Anxiety   ? Asthma   ? Boil 01/05/2017  ? Cellulitis 05/20/2020  ? Chondromalacia of right knee 04/20/2012  ? Complex tear of medial meniscus of left knee as current injury 02/25/2016  ? Complication of anesthesia   ? hard to wake up  ? COPD (chronic obstructive pulmonary disease) ([place])   ? chronic bronchitis  ? Derangement of anterior horn of lateral meniscus of left knee 02/25/2016  ? Duodenitis without mention of hemorrhage 01/01/2002  ? DVT (deep venous thrombosis) ([place])   ? history clot lt groin when she had lymphoma-in remision now.  ? Elevated triglycerides with high cholesterol 01/05/2017  ? Erythema 09/01/2021  ? Gastroparesis   ? GERD (gastroesophageal reflux disease)   ? Hiatal hernia 01/01/2002  ? HIV infection ([place])   ? CD4 = 570 (05/2010), VL undetectation  ? Hyperlipidemia   ? IBS (irritable bowel syndrome)   ? Migraines   ? on topamax and triptans, frequent (almost daily) attacks   ? MRSA (methicillin resistant staph aureus) culture positive 12/20/2010  ? Non Hodgkin's lymphoma ([place])   ? stage II, s/p resection, chemotherapy, radiotherapy, Dr. Beryle Beams is her oncologist.   ? PUD (peptic ulcer disease) 07/1997  ? per EGD report 07/1997 with history of esophagitis  ? Restless leg syndrome 12/24/2015  ? Shingles   ? in lumbar dermatome  ? Spondylolisthesis of lumbar region   ? L5-S1  ? TMJ (dislocation of temporomandibular joint)   ? Traumatic tear of lateral meniscus of right knee 04/20/2012  ? Umbilical bleeding 08/12/5282  ? Vaginal yeast infection 09/25/2017  ? ? ?Current  Outpatient Medications on File Prior to Visit  ?Medication Sig Dispense Refill  ? albuterol (PROAIR HFA) 108 (90 Base) MCG/ACT inhaler USE 1 TO 2 PUFFS EVERY 4-6 HOURS AS NEEDED FOR SHORTNESS OF BREATH ORWHEEZING 8.5 g 1  ? Beclomethasone Dipropionate 80 MCG/ACT AERS Place 2 Inhalers into the nose daily. 8.7 g 10  ? blood glucose meter kit and supplies Dispense based on patient and insurance preference. Use up to four times daily as directed. (FOR ICD-9 250.00, 250.01). 1 each 0  ? botulinum toxin Type A (BOTOX) 100 units SOLR injection INJECT UP TO 200 UNITS  INTRAMUSCULARLY EVERY 90  DAYS (DISCARD UNUSED AFTER  FIRST USE) 2 each 3  ? Calcium Carbonate-Vitamin D (CALCIUM-VITAMIN D) 500-200 MG-UNIT per tablet Take 2 tablets by mouth daily.     ? Darunavir-Cobicistat-Emtricitabine-Tenofovir Alafenamide (SYMTUZA) 800-150-200-10 MG TABS Take 1 tablet by mouth daily with breakfast. 30 tablet 11  ? diclofenac Sodium (VOLTAREN) 1 % GEL APPLY TOPICALLY TO AFFECTED AREA 4 GRAMS4 TIMES DAILY 100 g 11  ? FARXIGA 10 MG TABS tablet TAKE 1 TABLET BY MOUTH EVERY DAY 90 tablet 3  ? fluconazole (DIFLUCAN) 100 MG tablet Take 1 tablet (100 mg total) by mouth daily. 14 tablet 3  ? Galcanezumab-gnlm (EMGALITY) 120 MG/ML SOAJ 155m two consecutive injection as loading dose, then 124mmonthly 1.12 mL 12  ? glucose blood test strip Use as instructed 100 each 12  ? ibuprofen (  ADVIL) 800 MG tablet TAKE 1 TABLET BY MOUTH EVERY 8 HOURS AS NEEDED 30 tablet 5  ? Insulin Glargine (BASAGLAR KWIKPEN) 100 UNIT/ML SMARTSIG:30 Unit(s) SUB-Q Daily    ? metroNIDAZOLE (METROGEL) 0.75 % gel Apply 1 application topically 2 (two) times daily.    ? mometasone-formoterol (DULERA) 100-5 MCG/ACT AERO USE 2 PUFFS BY MOUTH THE FIRST THING IN THE MORNING AND THEN ANOTHER 2 PUFFS ABOUT 12 HOURS LATER 13 g 5  ? OZEMPIC, 0.25 OR 0.5 MG/DOSE, 2 MG/1.5ML SOPN Inject 0.5 mg into the skin once a week.    ? pantoprazole (PROTONIX) 40 MG tablet TAKE 1 TABLET BY MOUTH 2  TIMES A DAY FORACID REFLUX 60 tablet 4  ? Plecanatide (TRULANCE) 3 MG TABS Take 1 tablet by mouth daily. 30 tablet 0  ? prochlorperazine (COMPAZINE) 5 MG tablet Take 1 tablet (5 mg total) by mouth every 6 (six) hours as needed for up to 5 days for nausea or vomiting. 10 tablet 0  ? promethazine (PHENERGAN) 25 MG suppository UNWRAP & INSERT 1 SUPPOSITORY RECTALLY EVERY 6 HOURS AS NEEDED FOR NAUSEA & VOMITING 12 each 11  ? propranolol (INDERAL) 20 MG tablet TAKE 1 TABLET BY MOUTH 2 TIMES A DAY 180 tablet 1  ? rosuvastatin (CRESTOR) 20 MG tablet TAKE 1 TABLET BY MOUTH EVERY DAY FOR CHOLESTEROL 90 tablet 3  ? Tamsulosin HCl (FLOMAX) 0.4 MG CAPS Take 0.4 mg by mouth daily. 1-2 daily    ? terconazole (TERAZOL 7) 0.4 % vaginal cream PLACE 1 APPLICATORFUL VAGINALLY AT BEDTIME 45 g 3  ? tiZANidine (ZANAFLEX) 4 MG tablet TAKE 1 TABLET BY MOUTH EVERY 8 HOURS AS NEEDED FOR MUSCLE SPASMS 90 tablet 11  ? vitamin B-12 (CYANOCOBALAMIN) 1000 MCG tablet Take 1,000 mcg by mouth daily.    ? zolmitriptan (ZOMIG) 5 MG tablet TAKE 1 TABLET BY MOUTH AT ONSET OF HEADACHE & MAY REPEAT ONCE IN 2 HOURS IFNEEDED (MAX DOSE OF 2 TABS IN 24 HOURS) 12 tablet 11  ? ?No current facility-administered medications on file prior to visit.  ? ? ?Family History  ?Problem Relation Age of Onset  ? Hypertension Mother   ? Stroke Mother   ? Heart disease Mother   ? Diabetes Mother   ? Colon polyps Mother   ? Breast cancer Paternal Grandmother   ? Kidney cancer Maternal Uncle   ? Heart disease Maternal Uncle   ? Diabetes Maternal Uncle   ? Diabetes Maternal Grandmother   ? Heart disease Maternal Grandmother   ? Emphysema Paternal Uncle   ?     never smoker  ? Emphysema Paternal Grandfather   ?     never smoker  ? Colon cancer Paternal Aunt   ?     with met to lung   ? Non-Hodgkin's lymphoma Maternal Uncle   ? Heart disease Maternal Grandfather   ? ? ?Social History  ? ?Socioeconomic History  ? Marital status: Divorced  ?  Spouse name: Not on file  ? Number of  children: 0  ? Years of education: 67  ? Highest education level: Not on file  ?Occupational History  ? Occupation: disabled  ?  Employer: UNEMPLOYED  ?Tobacco Use  ? Smoking status: Never  ? Smokeless tobacco: Never  ?Vaping Use  ? Vaping Use: Never used  ?Substance and Sexual Activity  ? Alcohol use: No  ?  Alcohol/week: 0.0 standard drinks  ? Drug use: No  ? Sexual activity: Not Currently  ?  Partners: Male  ?  Birth control/protection: Surgical  ?Other Topics Concern  ? Not on file  ?Social History Narrative  ?   ? Patient is disabled.  ? Right handed.  ? Caffeine - Diet soda., tea  ? Education - high school  ? ?Social Determinants of Health  ? ?Financial Resource Strain: Not on file  ?Food Insecurity: Not on file  ?Transportation Needs: Not on file  ?Physical Activity: Not on file  ?Stress: Not on file  ?Social Connections: Not on file  ?Intimate Partner Violence: Not on file  ? ? ?Review of Systems: ?ROS negative except for what is noted on the assessment and plan. ? ?Vitals:  ? 09/30/21 1032 09/30/21 1128  ?BP: (!) 177/88 (!) 161/86  ?Pulse: 100   ?SpO2: 96%   ?Weight: 285 lb 9.6 oz (129.5 kg)   ?Height: 5' 6"  (1.676 m)   ? ?Physical Exam: ?Constitutional: well appearing, no acute distress ?HENT: normocephalic atraumatic, mucous membranes moist ?Eyes: conjunctiva non-erythematous ?Neck: supple ?Cardiovascular: regular rate and rhythm, no m/r/g ?Pulmonary/Chest: normal work of breathing on room air, lungs clear to auscultation bilaterally ?Abdominal: soft, non-tender, non-distended. Umbilicus without obvious bleeding, lesions, or abrasions.  ?MSK: normal bulk and tone ?Neurological: alert & oriented x 3 ?Skin: warm and dry ?Psych: normal mood and thought process ? ?Assessment & Plan:  ? ?Intractable chronic migraine without aura and without status migrainosus ?Assessment: ?Currently follows with [facility] for her migraines, current regimen of emgality and propranolol for prevention. For abortive  therapy she uses tizanidine, promethazine and zomitriptan for abortive therapy. She is also receiving periodic botox injections. Her migraine exacerbations can last up to 2 weeks. ? ?She has had a migraine attack fo

## 2021-10-01 NOTE — Assessment & Plan Note (Signed)
Assessment: ?She follows with endocrinology and her current regimen of ozempic, farxiga 10 mg and basaglar 35 U. Her basaglar was increased from 25U to 35U 3 months ago. Her A1c has improved from 10% to 8%. We discussed continuing the medicines and lifestyle modifications, she would not like to meet with Butch Penny, diabetes coordinator.  ? ?Continue regimen as per above and continue to follow with endocrinology.  ? ?Plan: ?-continue basaglar 35 U daily, farxiga 10 mg and ozempic .5 mg ?-repeat A1c in 3 months ?-continue management per endocrinology ?

## 2021-10-01 NOTE — Assessment & Plan Note (Signed)
Following with ID, last viral load undetectable. Continue to follow with ID, current regimen of symtuza.  ?

## 2021-10-01 NOTE — Assessment & Plan Note (Signed)
Initially evaluated by Dr. Tommy Medal of ID who ordered an umbilical [place] to evaluate bleeding. On examination no evidence of lesion or active bleeding.  ?

## 2021-10-01 NOTE — Progress Notes (Signed)
Internal Medicine Clinic Attending  Case discussed with Dr. Katsadouros  At the time of the visit.  We reviewed the resident's history and exam and pertinent patient test results.  I agree with the assessment, diagnosis, and plan of care documented in the resident's note.  

## 2021-10-01 NOTE — Assessment & Plan Note (Signed)
Assessment: ?Worsening cough for the past 2 weeks. She has on/off cough secondary to her hx of bronchitis. No systemic symptoms to suspect exacerbation of her bronchitis or infectious etiology. On exam lungs CTA.  ? ?She does have seasonal allergies which have been uncontrolled on claritin and flonase. I believe this is contributing to her acute coughing episode. Will add intranasal antihistamine to see if that helps in decreasing congestion.  ? ?Plan: ?-continue claritin, flonase, and add azelastine nasal spray ?-continue protonix for gerd.  ?-follow up in 1 month ?

## 2021-10-01 NOTE — Assessment & Plan Note (Signed)
Discussed shingrix vaccine, patient states she has had shingles numerous times in the past and will follow up with ID to discuss receiving vaccine. Will refer back to GI for colonscopy.  ?

## 2021-10-01 NOTE — Assessment & Plan Note (Signed)
Assessment: ?Currently follows with [facility] for her migraines, current regimen of emgality and propranolol for prevention. For abortive therapy she uses tizanidine, promethazine and zomitriptan for abortive therapy. She is also receiving periodic botox injections. Her migraine exacerbations can last up to 2 weeks. ? ?She has had a migraine attack for 7 days without resolution. It feels the same as prior migraine attacks. She will continue with abortive therapies and continue to follow with neurology ? ?Plan: ?-continue to follow with neurology and therapies as per above ? ?

## 2021-10-01 NOTE — Assessment & Plan Note (Signed)
>>  ASSESSMENT AND PLAN FOR TYPE 2 DIABETES MELLITUS ([facility]) WRITTEN ON 10/01/2021  9:29 AM BY Belva Agee, MD  Assessment: She follows with endocrinology and her current regimen of ozempic, farxiga 10 mg and basaglar 35 U. Her basaglar was increased from 25U to 35U 3 months ago. Her A1c has improved from 10% to 8%. We discussed continuing the medicines and lifestyle modifications, she would not like to meet with Lupita Leash, diabetes coordinator.   Continue regimen as per above and continue to follow with endocrinology.   Plan: -continue basaglar 35 U daily, farxiga 10 mg and ozempic .5 mg -repeat A1c in 3 months -continue management per endocrinology

## 2021-10-01 NOTE — Assessment & Plan Note (Addendum)
>>  ASSESSMENT AND PLAN FOR DYSLIPIDEMIA WRITTEN ON 10/01/2021  9:57 AM BY Alyce Jubilee, MD  Assessment: During last visit 08/22, patient transitioned to high intensity statin, rosuvastatin  20 mg. Repeat lipid panel today total cholesterol of 213. Unable to calculate LDL due to elevation of triglycerides. Will need fasting lipid panel to assess if need to increase rosuvo to 40 mg daily to reach LDL and Tg goal. Goal LDL under 100 mg/dL.  Plan: -continue rosuvostatin 20 mg daily -fasting lipid panel needed   >>ASSESSMENT AND PLAN FOR ELEVATED TRIGLYCERIDES WITH HIGH CHOLESTEROL WRITTEN ON 10/01/2021 10:05 AM BY Gabriana Wilmott, MD  Will need fasting lipid panel to determine Tg levels.

## 2021-10-04 ENCOUNTER — Telehealth: Payer: Self-pay

## 2021-10-04 NOTE — Telephone Encounter (Signed)
PA  for pt ( AZELASTINE HCL 0.15% SOL) came through on cover my meds .. was submitted with office notes awaiting approval or denial .. ? ? ? ? ? UPDATE : ? ? ? ?[employer] is reviewing your PA request. Typically an electronic response will be received within 24-72 hours ?

## 2021-10-05 MED ORDER — AZELASTINE HCL 0.1 % NA SOLN: 2.0000 | Freq: Two times a day (BID) | NASAL | 12 refills | Status: DC

## 2021-10-05 NOTE — Telephone Encounter (Signed)
Medication changed to azelastine 0.1% nasal spray ?

## 2021-10-05 NOTE — Telephone Encounter (Signed)
DECISION : ? ? ? ?DENIED  ? ?Here are the policy requirements your request did not meet: ?Per your health plan's criteria, this drug is covered if you meet the following: ?One of the following: ?(1) You have failed one preferred drug as confirmed by claims history or submission of medical ?records: The preferred drugs: azelastine 0.1% nasal spray, ipratropium nasal spray and olopatadine ?nasal spray. ?(2) You cannot use one preferred drug (please specify contraindication or intolerance ? ? ? ? ? ( COPY PLACED TO BE SCANNED TO CHART ALSO )  ?

## 2021-10-12 ENCOUNTER — Ambulatory Visit (HOSPITAL_COMMUNITY)
Admission: RE | Admit: 2021-10-12 | Discharge: 2021-10-12 | Disposition: A | Discharge status: Home / Self Care | Payer: Medicaid Other | Source: Ambulatory Visit | Attending: Infectious Disease | Admitting: Infectious Disease

## 2021-10-12 DIAGNOSIS — R198 Other specified symptoms and signs involving the digestive system and abdomen: Secondary | ICD-10-CM | POA: Insufficient documentation

## 2021-10-19 ENCOUNTER — Other Ambulatory Visit: Payer: Self-pay | Admitting: Internal Medicine

## 2021-10-19 ENCOUNTER — Encounter: Payer: Self-pay | Admitting: Student

## 2021-10-29 ENCOUNTER — Encounter: Payer: Medicaid Other | Admitting: Student

## 2021-11-02 ENCOUNTER — Telehealth: Payer: Self-pay

## 2021-11-02 NOTE — Telephone Encounter (Signed)
Pa for pt ( ZOLPIDEM TARTRATE TAB ) came through on cover my meds was submitted with last office notes .. awaiting approval or denial  ?

## 2021-11-04 NOTE — Telephone Encounter (Signed)
DECISION : ? ? ?Approved today ? ?Request Reference Number: [id number].  ? ? ?ZOLPIDEM TAB '10MG'$  is approved through 05/07/2022.  ? ? ? ?For further questions, call [employer] at [phone]. ? ? ?Drug ?Zolpidem Tartrate '10MG'$  tablets ? ? ?Form ?[employer] Electronic Prior Authorization Form [phone] NCPDP) ? ? ? ? ([place] )  ?

## 2021-11-04 NOTE — Telephone Encounter (Signed)
Faxed signed PA form with OV notes to [facility]. ? ?

## 2021-11-04 NOTE — Telephone Encounter (Signed)
Completed Botox PA form for [facility] plan, placed in Nurse Pod for MD signature. ?

## 2021-11-10 ENCOUNTER — Institutional Professional Consult (permissible substitution): Payer: Medicaid Other | Admitting: Behavioral Health

## 2021-11-18 ENCOUNTER — Other Ambulatory Visit: Payer: Self-pay | Admitting: Neurology

## 2021-11-18 ENCOUNTER — Other Ambulatory Visit: Payer: Self-pay | Admitting: Internal Medicine

## 2021-11-18 NOTE — Telephone Encounter (Signed)
Received (2) 100 units from [employer] [facility].

## 2021-12-02 ENCOUNTER — Ambulatory Visit: Payer: Medicaid Other | Admitting: Behavioral Health

## 2021-12-02 DIAGNOSIS — F419 Anxiety disorder, unspecified: Secondary | ICD-10-CM

## 2021-12-02 DIAGNOSIS — F331 Major depressive disorder, recurrent, moderate: Secondary | ICD-10-CM

## 2021-12-02 NOTE — BH Specialist Note (Signed)
Integrated Behavioral Health via Telemedicine Visit  12/02/2021 [name] [id number]  Number of [facility] Clinician visits: 1 Session Start time: 1115 Session End time: 1110 Total time in minutes: 25 min  Referring Provider: Dr. Johnney Ou, DO Patient/Family location: Pt is home in private [facility] Provider location: [facility] Office All persons participating in visit: Pt & Clinician Types of Service: Individual psychotherapy  I connected with [name] and/or [name]  self  via  Telephone or Video Enabled Telemedicine Application  (Video is Caregility application) and verified that I am speaking with the correct person using two identifiers. Discussed confidentiality: Yes   I discussed the limitations of telemedicine and the availability of in person appointments.  Discussed there is a possibility of technology failure and discussed alternative modes of communication if that failure occurs.  I discussed that engaging in this telemedicine visit, they consent to the provision of behavioral healthcare and the services will be billed under their insurance.  Patient and/or legal guardian expressed understanding and consented to Telemedicine visit: Yes   Presenting Concerns: Patient and/or family reports the following symptoms/concerns: Pt does not sound well today. She is in the bed. Duration of problem: months; Severity of problem: moderate  Patient and/or Family's Strengths/Protective Factors: Social connections and [place] supports in place (healthy food, safe environments, etc.)  Goals Addressed: Patient will:  Reduce symptoms of: anxiety and depression   Increase knowledge and/or ability of: coping skills, healthy habits, and stress reduction   Demonstrate ability to: Increase healthy adjustment to current life circumstances  Progress towards Goals: Estb'd today: Pt will address her concerns @ the next PCP visit. She is not open to psychotherapy @ this  time.  Interventions: Interventions utilized:  Medication Monitoring and Supportive Counseling Standardized Assessments completed:  screeners prn  Patient and/or Family Response: Pt does not feel well this morning. She has missed a prior call from this Clinician & does not want to initiate any psychotherapy sessions @ this time.   Assessment: Patient currently experiencing malaise from feeling ill.   Patient may benefit from attending her next PCP appt @ [facility] to address her medication needs.  Plan: Follow up with behavioral health clinician on : Pt can call prn Behavioral recommendations: Rest & do all things good for your body today. Referral(s):  None today per Pt  I discussed the assessment and treatment plan with the patient and/or parent/guardian. They were provided an opportunity to ask questions and all were answered. They agreed with the plan and demonstrated an understanding of the instructions.   They were advised to call back or seek an in-person evaluation if the symptoms worsen or if the condition fails to improve as anticipated.  Donnetta Hutching, LMFT

## 2021-12-07 NOTE — Telephone Encounter (Signed)
Received approval from [facility] [place] # [id number] (11/04/2021-11/05/2022).

## 2021-12-08 ENCOUNTER — Ambulatory Visit: Payer: Medicaid Other | Admitting: Neurology

## 2021-12-08 VITALS — BP 112/77 | HR 79

## 2021-12-08 DIAGNOSIS — G43711 Chronic migraine without aura, intractable, with status migrainosus: Secondary | ICD-10-CM | POA: Diagnosis not present

## 2021-12-08 NOTE — Progress Notes (Signed)
100 units x 2 vials LOT: [id number] EXP: 2025.09 NDC: 4431-5400-86  SP

## 2021-12-08 NOTE — Procedures (Signed)
     BOTOX PROCEDURE NOTE FOR MIGRAINE HEADACHE   HISTORY: [name] is here today for Botox injection for chronic migraine headache, her last Botox cycle was in March with Amy.  She continues to benefit from Botox.  She remains on Emgality, propanolol, Zomig, Phenergan for migraine therapy. Botox is more than 75% helpful, does feel wearing off few weeks before next injection. Here with her sister.   Description of procedure:  The patient was placed in a sitting position. The standard protocol was used for Botox as follows, with 5 units of Botox injected at each site:   -Procerus muscle, midline injection  -Corrugator muscle, bilateral injection  -Frontalis muscle, bilateral injection, with 2 sites each side, medial injection was performed in the upper one third of the frontalis muscle, in the region vertical from the medial inferior edge of the superior orbital rim. The lateral injection was again in the upper one third of the forehead vertically above the lateral limbus of the cornea, 1.5 cm lateral to the medial injection site.  -Temporalis muscle injection, 4 sites, bilaterally. The first injection was 3 cm above the tragus of the ear, second injection site was 1.5 cm to 3 cm up from the first injection site in line with the tragus of the ear. The third injection site was 1.5-3 cm forward between the first 2 injection sites. The fourth injection site was 1.5 cm posterior to the second injection site.  -Occipitalis muscle injection, 3 sites, bilaterally. The first injection was done one half way between the occipital protuberance and the tip of the mastoid process behind the ear. The second injection site was done lateral and superior to the first, 1 fingerbreadth from the first injection. The third injection site was 1 fingerbreadth superiorly and medially from the first injection site.  -Cervical paraspinal muscle injection, 2 sites, bilateral, the first injection site was 1 cm from the  midline of the cervical spine, 3 cm inferior to the lower border of the occipital protuberance. The second injection site was 1.5 cm superiorly and laterally to the first injection site.  -Trapezius muscle injection was performed at 3 sites, bilaterally. The first injection site was in the upper trapezius muscle halfway between the inflection point of the neck, and the acromion. The second injection site was one half way between the acromion and the first injection site. The third injection was done between the first injection site and the inflection point of the neck.   A 200 unit bottle of Botox was used, 155 units were injected, the rest of the Botox was wasted. The patient tolerated the procedure well, there were no complications of the above procedure.  Botox NDC 0045-9977-41 Lot number [id number] Expiration date 03/2024 SP

## 2021-12-15 ENCOUNTER — Other Ambulatory Visit: Payer: Self-pay | Admitting: Neurology

## 2021-12-15 NOTE — Telephone Encounter (Signed)
Rx refilled.

## 2022-01-11 ENCOUNTER — Other Ambulatory Visit: Payer: Self-pay | Admitting: Internal Medicine

## 2022-01-11 DIAGNOSIS — M4316 Spondylolisthesis, lumbar region: Secondary | ICD-10-CM

## 2022-02-03 ENCOUNTER — Telehealth: Payer: Self-pay | Admitting: Neurology

## 2022-02-03 NOTE — Telephone Encounter (Signed)
LVM informing pt of appt change for upcoming Botox [name] out). Moved from 03/03/22 to 03/02/22 at 3:45 pm.

## 2022-02-08 ENCOUNTER — Other Ambulatory Visit: Payer: Self-pay | Admitting: Neurology

## 2022-02-08 ENCOUNTER — Other Ambulatory Visit: Payer: Self-pay | Admitting: Student

## 2022-02-08 DIAGNOSIS — M4316 Spondylolisthesis, lumbar region: Secondary | ICD-10-CM

## 2022-03-01 ENCOUNTER — Telehealth: Payer: Self-pay
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
## 2022-03-02 ENCOUNTER — Ambulatory Visit: Payer: Medicaid Other | Admitting: Neurology

## 2022-03-02 ENCOUNTER — Encounter: Payer: Self-pay | Admitting: Neurology

## 2022-03-02 VITALS — BP 163/88 | HR 80 | Ht 66.0 in | Wt 285.0 lb

## 2022-03-02 DIAGNOSIS — G43711 Chronic migraine without aura, intractable, with status migrainosus: Secondary | ICD-10-CM

## 2022-03-02 MED ORDER — ONABOTULINUMTOXINA 200 UNITS IJ SOLR
200.0000 [IU] | Freq: Once | INTRAMUSCULAR | Status: AC
  Administered 2022-03-02: 155 [IU] via INTRAMUSCULAR

## 2022-03-02 NOTE — Progress Notes (Signed)
Botox 200 units x 1 vial  LOT #: [id number] EXP: 2026/02 NDC: 1674-2552-58  SP

## 2022-03-02 NOTE — Progress Notes (Signed)
    BOTOX PROCEDURE NOTE FOR MIGRAINE HEADACHE   HISTORY: [name] is here today for Botox injection for chronic migraine headache. Her last injection was 12/08/21 with me. Continues to be 75% helpful  Description of procedure:  The patient was placed in a sitting position. The standard protocol was used for Botox as follows, with 5 units of Botox injected at each site:  -Procerus muscle, midline injection  -Corrugator muscle, bilateral injection  -Frontalis muscle, bilateral injection, with 2 sites each side, medial injection was performed in the upper one third of the frontalis muscle, in the region vertical from the medial inferior edge of the superior orbital rim. The lateral injection was again in the upper one third of the forehead vertically above the lateral limbus of the cornea, 1.5 cm lateral to the medial injection site.  -Temporalis muscle injection, 4 sites, bilaterally. The first injection was 3 cm above the tragus of the ear, second injection site was 1.5 cm to 3 cm up from the first injection site in line with the tragus of the ear. The third injection site was 1.5-3 cm forward between the first 2 injection sites. The fourth injection site was 1.5 cm posterior to the second injection site.  -Occipitalis muscle injection, 3 sites, bilaterally. The first injection was done one half way between the occipital protuberance and the tip of the mastoid process behind the ear. The second injection site was done lateral and superior to the first, 1 fingerbreadth from the first injection. The third injection site was 1 fingerbreadth superiorly and medially from the first injection site.  -Cervical paraspinal muscle injection, 2 sites, bilateral, the first injection site was 1 cm from the midline of the cervical spine, 3 cm inferior to the lower border of the occipital protuberance. The second injection site was 1.5 cm superiorly and laterally to the first injection site.  -Trapezius muscle  injection was performed at 3 sites, bilaterally. The first injection site was in the upper trapezius muscle halfway between the inflection point of the neck, and the acromion. The second injection site was one half way between the acromion and the first injection site. The third injection was done between the first injection site and the inflection point of the neck.  A 200 unit bottle of Botox was used, 155 units were injected, the rest of the Botox was wasted. The patient tolerated the procedure well, there were no complications of the above procedure.  Botox NDC 7124-5809-98 Lot number [id number] Expiration date 08/2024 SP

## 2022-03-03 ENCOUNTER — Ambulatory Visit: Payer: Medicaid Other | Admitting: Neurology

## 2022-03-08 ENCOUNTER — Other Ambulatory Visit: Payer: Self-pay | Admitting: Student

## 2022-03-08 DIAGNOSIS — G4700 Insomnia, unspecified: Secondary | ICD-10-CM

## 2022-03-11 ENCOUNTER — Other Ambulatory Visit: Payer: Self-pay | Admitting: Neurology

## 2022-03-15 ENCOUNTER — Telehealth: Payer: Self-pay

## 2022-03-15 NOTE — Telephone Encounter (Signed)
PA for zomig was approved. "Request Reference Number: [id number]. ZOLMITRIPTAN TAB '5MG'$  is approved through 03/16/2023. For further questions, call [employer] at [phone]."

## 2022-03-15 NOTE — Telephone Encounter (Signed)
Received PA request for zomig. Completed via CMM. Sent to [place]. Key: [id number]. Should have determination within 3-5 business days.

## 2022-03-24 DIAGNOSIS — Z7984 Long term (current) use of oral hypoglycemic drugs: Secondary | ICD-10-CM | POA: Diagnosis not present

## 2022-03-24 DIAGNOSIS — Z7985 Long-term (current) use of injectable non-insulin antidiabetic drugs: Secondary | ICD-10-CM | POA: Diagnosis not present

## 2022-03-24 DIAGNOSIS — E785 Hyperlipidemia, unspecified: Secondary | ICD-10-CM | POA: Diagnosis not present

## 2022-03-24 DIAGNOSIS — E1142 Type 2 diabetes mellitus with diabetic polyneuropathy: Secondary | ICD-10-CM | POA: Diagnosis not present

## 2022-03-24 DIAGNOSIS — E1165 Type 2 diabetes mellitus with hyperglycemia: Secondary | ICD-10-CM | POA: Diagnosis not present

## 2022-03-24 DIAGNOSIS — Z794 Long term (current) use of insulin: Secondary | ICD-10-CM | POA: Diagnosis not present

## 2022-04-11 ENCOUNTER — Encounter: Payer: Medicaid Other | Admitting: Student

## 2022-04-11 NOTE — Progress Notes (Deleted)
  09/30/2021 Migraines- can last 2 weeks.  emgality, propranolo for prevention. Tizanidine, promethazine, zolmitriptan for abortive, and botox follows with [facility] HTN - omlesartan 40 mg  T2DM- glargine 35, farxiga 10, ozempic 0.5. A1c in 09/2021 8.2. follows with endocrine next appt 06/30/2022 HIV- symtuza,  follows with ID labs from 09/01/2021, HIV RNA undectable, CD4 1098. Next appt *** HLD - 09/01/2021 tchol 213, tg 585, unable to calc LDL (prior 137-172). Rosuv 20, needs fasting lipids Insomnia - zolpidem  HC maintenance - shingles and colonoscopy *** Umbilical bleed - [place] 0/62/6948 with no abnormality  Depression - cymbalta 30 12/02/2021 telehealth with [facility], not interested in psychotherapy GERD - pantoprazole 40 mg  Osteopenia - dexa 04/2011 with normal T but Z of -2.3 recommend 10-15 year repeat, on calcium and vit d Class ** obesity - on ozempic  NHL - Stage IA high-grade B-cell non-Hodgkin's lymphoma  presenting with left axillary lymphadenopathy in January 2005. She received 4 cycles of CHOP Rituxan chemotherapy/immunotherapy then involved field radiation therapy. She achieved a complete and durable response. Constipation- Trulance *** ?  Terconazole vaginal cream, tamsulosin, Dulera, beclomethasone inhaler, albuterol metronidazole gel, fluconazole,

## 2022-05-07 ENCOUNTER — Other Ambulatory Visit: Payer: Self-pay | Admitting: Student

## 2022-05-07 ENCOUNTER — Other Ambulatory Visit: Payer: Self-pay | Admitting: Neurology

## 2022-05-07 DIAGNOSIS — M4316 Spondylolisthesis, lumbar region: Secondary | ICD-10-CM

## 2022-06-03 ENCOUNTER — Telehealth: Payer: Self-pay | Admitting: Neurology

## 2022-06-03 NOTE — Telephone Encounter (Signed)
[place]  called to schedule Botox delivery for Tues Dec 5th  [facility] address verified and added to order. Botox signel dose vial 100 units 2 vials signature will be required by [employer]

## 2022-06-06 NOTE — Telephone Encounter (Signed)
Pt never stopped to schedule botox at last visit. I called and left her a voicemail that I got her tentatively scheduled for 07/05/22 with Judson Roch. Provided our phone # if this date does not work for her.

## 2022-06-08 ENCOUNTER — Other Ambulatory Visit: Payer: Self-pay | Admitting: Neurology

## 2022-06-15 ENCOUNTER — Telehealth: Payer: Self-pay

## 2022-06-15 NOTE — Telephone Encounter (Signed)
PA for [employer]  (Key: [id number])  [employer] is reviewing your PA request. Typically an electronic response will be received within 24-72 hours. To check for an update later, open this request from your dashboard.  You may close this dialog and return to your dashboard to perform other tasks.

## 2022-06-20 ENCOUNTER — Encounter: Payer: Medicaid Other | Admitting: Student

## 2022-06-24 ENCOUNTER — Other Ambulatory Visit: Payer: Self-pay | Admitting: Obstetrics

## 2022-06-24 ENCOUNTER — Telehealth: Payer: Self-pay | Admitting: Neurology

## 2022-06-24 DIAGNOSIS — Z1231 Encounter for screening mammogram for malignant neoplasm of breast: Secondary | ICD-10-CM

## 2022-06-24 NOTE — Telephone Encounter (Signed)
Pt called stating that she is wanting to see MD to discuss her Tremors. Referral was not found for tremors and pt states she has been seen for tremors. Please advise.

## 2022-06-28 NOTE — Telephone Encounter (Signed)
Phone rep called pt to schedule an appointment re: wanting to discuss her Tremors .  After checking DPR a vm was left asking pt to call to be scheduled with Dr Krista Blue

## 2022-07-05 ENCOUNTER — Ambulatory Visit: Payer: Medicaid Other | Admitting: Neurology

## 2022-07-05 VITALS — BP 132/78 | HR 78 | Ht 66.0 in | Wt 285.0 lb

## 2022-07-05 DIAGNOSIS — G43711 Chronic migraine without aura, intractable, with status migrainosus: Secondary | ICD-10-CM

## 2022-07-05 MED ORDER — ZOLMITRIPTAN 5 MG PO TABS: ORAL_TABLET | ORAL | 11 refills | Status: DC

## 2022-07-05 MED ORDER — EMGALITY 120 MG/ML ~~LOC~~ SOAJ: SUBCUTANEOUS | 3 refills | Status: DC

## 2022-07-05 MED ORDER — ONABOTULINUMTOXINA 100 UNITS IJ SOLR
100.0000 [IU] | Freq: Once | INTRAMUSCULAR | Status: AC
  Administered 2022-07-05: 200 [IU] via INTRAMUSCULAR

## 2022-07-05 MED ORDER — PROPRANOLOL HCL 20 MG PO TABS: 20.0000 mg | ORAL_TABLET | Freq: Two times a day (BID) | ORAL | 1 refills | Status: DC

## 2022-07-05 MED ORDER — UBRELVY 100 MG PO TABS: 100.0000 mg | ORAL_TABLET | ORAL | 11 refills | Status: DC | PRN

## 2022-07-05 NOTE — Progress Notes (Signed)
Botox 100 units x 2 vial [id number] Exp-09/2024 SP

## 2022-07-05 NOTE — Progress Notes (Signed)
   BOTOX PROCEDURE NOTE FOR MIGRAINE HEADACHE   HISTORY: Here today with her mom. Here today for botox for chronic migraines. Still quite helpful for migraines. Has more good than bad days. Remains on Emgality. Takes Zomig, uses all 12 a month, is very helpful, works the best of all tried. Still has cluster of headaches that Zomig doesn't work for, mentions trying [place].   Description of procedure:  The patient was placed in a sitting position. The standard protocol was used for Botox as follows, with 5 units of Botox injected at each site:  -Procerus muscle, midline injection  -Corrugator muscle, bilateral injection  -Frontalis muscle, bilateral injection, with 2 sites each side, medial injection was performed in the upper one third of the frontalis muscle, in the region vertical from the medial inferior edge of the superior orbital rim. The lateral injection was again in the upper one third of the forehead vertically above the lateral limbus of the cornea, 1.5 cm lateral to the medial injection site.  -Temporalis muscle injection, 4 sites, bilaterally. The first injection was 3 cm above the tragus of the ear, second injection site was 1.5 cm to 3 cm up from the first injection site in line with the tragus of the ear. The third injection site was 1.5-3 cm forward between the first 2 injection sites. The fourth injection site was 1.5 cm posterior to the second injection site.  -Occipitalis muscle injection, 3 sites, bilaterally. The first injection was done one half way between the occipital protuberance and the tip of the mastoid process behind the ear. The second injection site was done lateral and superior to the first, 1 fingerbreadth from the first injection. The third injection site was 1 fingerbreadth superiorly and medially from the first injection site.  -Cervical paraspinal muscle injection, 2 sites, bilateral, the first injection site was 1 cm from the midline of the cervical spine,  3 cm inferior to the lower border of the occipital protuberance. The second injection site was 1.5 cm superiorly and laterally to the first injection site.  -Trapezius muscle injection was performed at 3 sites, bilaterally. The first injection site was in the upper trapezius muscle halfway between the inflection point of the neck, and the acromion. The second injection site was one half way between the acromion and the first injection site. The third injection was done between the first injection site and the inflection point of the neck.   A 200 unit bottle of Botox was used, 155 units were injected, the rest of the Botox was wasted. The patient tolerated the procedure well, there were no complications of the above procedure.  Botox NDC [phone] Lot number [id number] Expiration date 09/2024 SP  I will send in Ubrelvy for her to try as alternative to Zomig, due to causing rebound headaches when in cluster. We also discussed considering coming off propranolol in the future,  unclear how much benefit.

## 2022-07-06 ENCOUNTER — Telehealth: Payer: Self-pay

## 2022-07-06 NOTE — Telephone Encounter (Signed)
A PA for Ubrelvy 100 MG tablets has been started on [facility]. (Key: [id number])

## 2022-07-08 ENCOUNTER — Other Ambulatory Visit: Payer: Self-pay | Admitting: Student

## 2022-07-12 ENCOUNTER — Encounter: Payer: Self-pay | Admitting: Student

## 2022-07-12 ENCOUNTER — Other Ambulatory Visit: Payer: Self-pay

## 2022-07-12 ENCOUNTER — Ambulatory Visit: Payer: Medicaid Other | Admitting: Neurology

## 2022-07-12 ENCOUNTER — Ambulatory Visit: Payer: Medicaid Other | Admitting: Student

## 2022-07-12 VITALS — BP 113/66 | HR 71 | Temp 98.1°F | Wt 299.4 lb

## 2022-07-12 DIAGNOSIS — F3341 Major depressive disorder, recurrent, in partial remission: Secondary | ICD-10-CM

## 2022-07-12 DIAGNOSIS — E119 Type 2 diabetes mellitus without complications: Secondary | ICD-10-CM

## 2022-07-12 DIAGNOSIS — Z23 Encounter for immunization: Secondary | ICD-10-CM

## 2022-07-12 DIAGNOSIS — G43719 Chronic migraine without aura, intractable, without status migrainosus: Secondary | ICD-10-CM | POA: Diagnosis not present

## 2022-07-12 DIAGNOSIS — I10 Essential (primary) hypertension: Secondary | ICD-10-CM

## 2022-07-12 DIAGNOSIS — Z0000 Encounter for general adult medical examination without abnormal findings: Secondary | ICD-10-CM

## 2022-07-12 DIAGNOSIS — Z794 Long term (current) use of insulin: Secondary | ICD-10-CM | POA: Diagnosis not present

## 2022-07-12 DIAGNOSIS — B20 Human immunodeficiency virus [HIV] disease: Secondary | ICD-10-CM | POA: Diagnosis not present

## 2022-07-12 DIAGNOSIS — G4700 Insomnia, unspecified: Secondary | ICD-10-CM

## 2022-07-12 DIAGNOSIS — R29818 Other symptoms and signs involving the nervous system: Secondary | ICD-10-CM

## 2022-07-12 DIAGNOSIS — K5900 Constipation, unspecified: Secondary | ICD-10-CM | POA: Diagnosis not present

## 2022-07-12 DIAGNOSIS — Z7984 Long term (current) use of oral hypoglycemic drugs: Secondary | ICD-10-CM

## 2022-07-12 LAB — GLUCOSE, CAPILLARY: Glucose-Capillary: 362 mg/dL — ABNORMAL HIGH (ref 70–99)

## 2022-07-12 LAB — POCT GLYCOSYLATED HEMOGLOBIN (HGB A1C): Hemoglobin A1C: 10.6 % — AB (ref 4.0–5.6)

## 2022-07-12 MED ORDER — BELSOMRA 10 MG PO TABS: 10.0000 mg | ORAL_TABLET | Freq: Every day | ORAL | 0 refills | Status: DC

## 2022-07-12 MED ORDER — FLUOXETINE HCL 20 MG PO CAPS: 20.0000 mg | ORAL_CAPSULE | Freq: Every day | ORAL | 2 refills | Status: DC

## 2022-07-12 NOTE — Assessment & Plan Note (Signed)
Assessment: Discontinued olmesartan as she began having episodes of hair loss. BP well controlled today on no medications. Given BP log and instructed to check BP's multiple times a week. If at goal below 130/90, continue to monitor  Plan: - monitor BP's off medications

## 2022-07-12 NOTE — Assessment & Plan Note (Signed)
Assessment: Doing well and following with [facility] neurology.   Plan: - continue to follow with [facility] neurology. If breakthrough migraines encouraged her to reach out to follow up with their office

## 2022-07-12 NOTE — Patient Instructions (Addendum)
Thank you, [name] for allowing [place] to provide your care today. Today we discussed.    Depression Please start prozac and discontinue taking your cymablta. I would recommend following up with counseling services as well. Please call with any side effects from this medicine  Insomnia You are having difficulties falling and staying asleep. I suspect this is a combination of OSA and generalized insomnia. Please follow up with the sleep center and start taking belsomra. Please stop taking ambien and take your ambien pills to the pharmacy to be dispensed of properly.  Diabetes Please call and schedule a follow up with your endocrinologist  Colon cancer screening Please follow up with Dr. Fuller Plan for your colonoscopy  HIV Please call and schedule an appointment to be seen by Dr. Tommy Medal  Blood pressure Please check your blood pressure at home and call us if it is greater than 130/80 regularly. We will restart a different blood pressure medicine  Migraines Please call your neurologist to schedule a follow up appointment  I have ordered the following labs for you:   Lab Orders         Glucose, capillary         POC Hbg A1C      Referrals ordered today:    Referral Orders         Ambulatory referral to Ophthalmology         Ambulatory referral to Sleep Studies      I have ordered the following medication/changed the following medications:   Stop the following medications: Medications Discontinued During This Encounter  Medication Reason   DULoxetine (CYMBALTA) 30 MG capsule    zolpidem (AMBIEN) 10 MG tablet    zolmitriptan (ZOMIG) 5 MG tablet      Start the following medications: Meds ordered this encounter  Medications   FLUoxetine (PROZAC) 20 MG capsule    Sig: Take 1 capsule (20 mg total) by mouth daily.    Dispense:  90 capsule    Refill:  2   Suvorexant (BELSOMRA) 10 MG TABS    Sig: Take 10 mg by mouth daily.    Dispense:  30 tablet    Refill:  0      Follow up:  1 month follow up for insomnia/depression     Should you have any questions or concerns please call the internal medicine clinic at [phone].    [name], D.O. [place]

## 2022-07-13 NOTE — Assessment & Plan Note (Signed)
Ophthalmology referral placed.

## 2022-07-13 NOTE — Assessment & Plan Note (Signed)
Assessment: Recurrent depression and has tried multiple medications without improvement. Most recently we changed her to cymablta but she discontinued after no response in 4 weeks. In the past she has tried multiple SSRI/SNRI, TCA, wellbutrin without relief. She was supposed to follow with IBH in [facility] but when prior therapist left she had no one to follow up with. At this point I recommended continuing counseling and referral to psychiatry for further medication management however she would like to try prozac again. Her mother notes that she saw improvement when [name] was on this medicine. She is not interested in counseling  Plan: - start prozac 20 mg daily and follow up in 1 month - encourage counseling - if no improvement, consider psychiatry referral

## 2022-07-13 NOTE — Assessment & Plan Note (Signed)
>>  ASSESSMENT AND PLAN FOR TYPE 2 DIABETES MELLITUS (HCC) WRITTEN ON 07/13/2022 10:17 AM BY Belva Agee, MD  Assessment: Follows with endocrinology to help manage her diabetes. Current regimen of glargine, ozempic, and farxiga. They are currently titrating your insulin levels. Current A1c of 10.6%.   Plan: - continue glargine, ozempic, farxiga and instructed patient to call to follow up with endocrinology to adjust insulin levels

## 2022-07-13 NOTE — Assessment & Plan Note (Signed)
Assessment: Continued to do well on Symtuza. Will continue to follow with ID clinic  Plan: - Continue symtuza 800-150-10 mg tabs

## 2022-07-13 NOTE — Assessment & Plan Note (Signed)
Assessment: Follows with endocrinology to help manage her diabetes. Current regimen of glargine, ozempic, and farxiga. They are currently titrating your insulin levels. Current A1c of 10.6%.   Plan: - continue glargine, ozempic, farxiga and instructed patient to call to follow up with endocrinology to adjust insulin levels

## 2022-07-13 NOTE — Progress Notes (Signed)
Internal Medicine Clinic Attending  Case discussed with Dr. Katsadouros  At the time of the visit.  We reviewed the resident's history and exam and pertinent patient test results.  I agree with the assessment, diagnosis, and plan of care documented in the resident's note.  

## 2022-07-13 NOTE — Assessment & Plan Note (Signed)
Assessment: Ambien has worked well in the past for her insomnia, however, no longer seems to be working and she is not sleeping well. Will trial belsomra and DC ambien. Of note her STOP-BANG is elevated and recommended sleep study. Initially not interested but after further conversation she is agreeable to sleep study referral.   Plan: - trial belsomra - sleep study referral placed for suspected OSA contributing to insomnia

## 2022-07-13 NOTE — Progress Notes (Signed)
CC: follow up insomnia, diabetes, depression  HPI:  [name] is a 57 y.o. female living with a history stated below and presents today for her chronic medical conditions of insomnia, depression, and diabetes. Please see problem based assessment and plan for additional details.  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   Elevated triglycerides with high cholesterol 01/05/2017   Erythema 09/01/2021   Gastroparesis    GERD (gastroesophageal reflux disease)    Hiatal hernia 01/01/2002   HIV infection ([place])    CD4 = 570 (05/2010), VL undetectation   Hyperlipidemia    IBS (irritable bowel syndrome)    Migraines    on topamax and triptans, frequent (almost daily) attacks    MRSA (methicillin resistant staph aureus) culture positive 12/20/2010   Non Hodgkin's lymphoma ([place])    stage II, s/p resection, chemotherapy, radiotherapy, Dr. Beryle Beams is her oncologist.    PUD (peptic ulcer disease) 07/1997   per EGD report 07/1997 with history of esophagitis   Restless leg syndrome 12/24/2015   Shingles    in lumbar dermatome   Spondylolisthesis of lumbar region    L5-S1   TMJ (dislocation of temporomandibular joint)    Traumatic tear of lateral meniscus of right knee 94/58/5929   Umbilical bleeding 08/07/4626   Vaginal yeast infection 09/25/2017     Current Outpatient Medications on File Prior to Visit  Medication Sig Dispense Refill   albuterol (PROAIR HFA) 108 (90 Base) MCG/ACT inhaler USE 1 TO 2 PUFFS EVERY 4-6 HOURS AS NEEDED FOR SHORTNESS OF BREATH ORWHEEZING 8.5 g 1   azelastine (ASTELIN) 0.1 % nasal spray Place 2 sprays into both nostrils 2 (two) times daily. Use in each nostril as directed 30 mL 12   Beclomethasone Dipropionate 80 MCG/ACT AERS Place 2 Inhalers into the nose daily. 8.7 g 10   blood glucose meter kit and supplies Dispense based on patient and insurance preference. Use up to four times daily as directed. (FOR ICD-9 250.00, 250.01). 1 each 0   botulinum toxin Type A (BOTOX) 100 units SOLR injection INJECT UP TO 200 UNITS  INTRAMUSCULARLY EVERY 90  DAYS (DISCARD UNUSED AFTER  FIRST USE) 2 each 3   Calcium Carbonate-Vitamin D (CALCIUM-VITAMIN D) 500-200 MG-UNIT per tablet Take 2 tablets by mouth daily.      Darunavir-Cobicistat-Emtricitabine-Tenofovir Alafenamide (SYMTUZA) 800-150-200-10 MG TABS Take 1 tablet by mouth daily with breakfast. 30 tablet 11   diclofenac Sodium (VOLTAREN) 1 % GEL APPLY TOPICALLY TO AFFECTED AREA 4 GRAMS 4 TIMES DAILY 100 g 2   FARXIGA 10 MG TABS tablet TAKE 1 TABLET BY MOUTH EVERY DAY 90 tablet 3   fluconazole (DIFLUCAN) 100 MG tablet Take 1 tablet (100 mg total) by mouth daily. 14 tablet 3   Galcanezumab-gnlm (EMGALITY) 120  MG/ML SOAJ INJECT 120 MG INTO SKIN ONCE A MONTH AS DIRECTED 1 mL 3   glucose blood test strip Use as instructed 100 each 12   ibuprofen (ADVIL) 800 MG tablet TAKE 1 TABLET BY MOUTH EVERY 8 HOURS AS NEEDED 30 tablet 5   Insulin Glargine (BASAGLAR KWIKPEN) 100 UNIT/ML SMARTSIG:30 Unit(s) SUB-Q Daily     mometasone-formoterol (DULERA) 100-5 MCG/ACT AERO USE 2 PUFFS BY MOUTH THE FIRST THING IN THE MORNING AND THEN ANOTHER 2 PUFFS ABOUT 12 HOURS LATER 13 g 5   olmesartan (BENICAR) 40 MG tablet Take 1 tablet (40 mg total) by mouth daily. 30 tablet 11   OZEMPIC, 0.25 OR 0.5  MG/DOSE, 2 MG/1.5ML SOPN Inject 0.5 mg into the skin once a week.     pantoprazole (PROTONIX) 40 MG tablet TAKE 1 TABLET BY MOUTH 2 TIMES A DAY FORACID REFLUX 60 tablet 4   Plecanatide (TRULANCE) 3 MG TABS Take 1 tablet by mouth daily. 30 tablet 0   propranolol (INDERAL) 20 MG tablet Take 1 tablet (20 mg total) by mouth 2 (two) times daily. 180 tablet 1   rosuvastatin (CRESTOR) 20 MG tablet TAKE 1 TABLET BY MOUTH EVERY DAY FOR CHOLESTEROL 90 tablet 3   Tamsulosin HCl (FLOMAX) 0.4 MG CAPS Take 0.4 mg by mouth daily. 1-2 daily     terconazole (TERAZOL 7) 0.4 % vaginal cream PLACE 1 APPLICATORFUL VAGINALLY AT BEDTIME 45 g 3   tiZANidine (ZANAFLEX) 4 MG tablet TAKE 1 TABLET BY MOUTH EVERY 8 HOURS AS NEEDED FOR MUSCLE SPASMS 90 tablet 11   Ubrogepant (UBRELVY) 100 MG TABS Take 100 mg by mouth as needed (take 1 at onset of headache, may repeat 2 hours later if needed, max is 2 tablets in 24 hours). 10 tablet 11   vitamin B-12 (CYANOCOBALAMIN) 1000 MCG tablet Take 1,000 mcg by mouth daily.     No current facility-administered medications on file prior to visit.    Family History  Problem Relation Age of Onset   Hypertension Mother    Stroke Mother    Heart disease Mother    Diabetes Mother    Colon polyps Mother    Breast cancer Paternal Grandmother    Kidney cancer Maternal Uncle    Heart disease Maternal Uncle    Diabetes Maternal Uncle    Diabetes Maternal Grandmother    Heart disease Maternal Grandmother    Emphysema Paternal Uncle        never smoker   Emphysema Paternal Grandfather        never smoker   Colon cancer Paternal Aunt        with met to lung    Non-Hodgkin's lymphoma Maternal Uncle    Heart disease Maternal Grandfather     Social History   Socioeconomic History   Marital status: Divorced    Spouse name: Not on file   Number of children: 0   Years of education: 12   Highest education level: Not on file  Occupational History   Occupation: disabled    Fish farm manager:  UNEMPLOYED  Tobacco Use   Smoking status: Never   Smokeless tobacco: Never  Vaping Use   Vaping Use: Never used  Substance and Sexual Activity   Alcohol use: No    Alcohol/week: 0.0 standard drinks of alcohol   Drug use: No   Sexual activity: Not Currently    Partners: Male    Birth control/protection: Surgical  Other Topics Concern   Not on file  Social  History Narrative      Patient is disabled.   Right handed.   Caffeine - Diet soda., tea   Education - high school   Social Determinants of Health   Financial Resource Strain: Not on file  Food Insecurity: Not on file  Transportation Needs: Not on file  Physical Activity: Not on file  Stress: Not on file  Social Connections: Not on file  Intimate Partner Violence: Not on file   Review of Systems: ROS negative except for what is noted on the assessment and plan.  Vitals:   07/12/22 1104 07/12/22 1105  BP:  113/66  Pulse:  71  Temp:  98.1 F (36.7 C)  TempSrc:  Oral  SpO2:  98%  Weight: 299 lb 6.4 oz (135.8 kg)     Physical Exam: Constitutional: in no acute distress HENT: normocephalic atraumatic Eyes: conjunctiva non-erythematous Neck: supple Cardiovascular: regular rate and rhythm, no m/r/g Pulmonary/Chest: normal work of breathing on room air MSK: normal bulk and tone Neurological: alert & oriented x 3 Skin: warm and dry Psych: normal mood  Assessment & Plan:   Hypertension Assessment: Discontinued olmesartan as she began having episodes of hair loss. BP well controlled today on no medications. Given BP log and instructed to check BP's multiple times a week. If at goal below 130/90, continue to monitor  Plan: - monitor BP's off medications   Intractable chronic migraine without aura and without status migrainosus Assessment: Doing well and following with [facility] neurology.   Plan: - continue to follow with [facility] neurology. If breakthrough migraines encouraged her to reach out to follow up  with their office  Type 2 diabetes mellitus ([place]) Assessment: Follows with endocrinology to help manage her diabetes. Current regimen of glargine, ozempic, and farxiga. They are currently titrating your insulin levels. Current A1c of 10.6%.   Plan: - continue glargine, ozempic, farxiga and instructed patient to call to follow up with endocrinology to adjust insulin levels   Constipation Assessment: Continues to follow with Dr. Fuller Plan. In the past he has not refilled her plecanatide until she follows up with his office. She has an upcoming appointment  Plan: -follow up with Dr. Fuller Plan   Depression Assessment: Recurrent depression and has tried multiple medications without improvement. Most recently we changed her to cymablta but she discontinued after no response in 4 weeks. In the past she has tried multiple SSRI/SNRI, TCA, wellbutrin without relief. She was supposed to follow with IBH in [facility] but when prior therapist left she had no one to follow up with. At this point I recommended continuing counseling and referral to psychiatry for further medication management however she would like to try prozac again. Her mother notes that she saw improvement when Damaris was on this medicine. She is not interested in counseling  Plan: - start prozac 20 mg daily and follow up in 1 month - encourage counseling - if no improvement, consider psychiatry referral  Human immunodeficiency virus (HIV) disease Assessment: Continued to do well on Symtuza. Will continue to follow with ID clinic  Plan: - Continue symtuza 800-150-10 mg tabs  Insomnia Assessment: Ambien has worked well in the past for her insomnia, however, no longer seems to be working and she is not sleeping well. Will trial belsomra and DC ambien. Of note her STOP-BANG is elevated and recommended sleep study. Initially not interested but after further conversation she is agreeable to sleep study referral.   Plan: - trial belsomra -  sleep study referral placed for  suspected OSA contributing to insomnia  Health care maintenance Ophthalmology referral placed  Patient discussed with Dr. Donnita Falls, D.O. [facility], PGY-3 Phone: [phone] Date 07/13/2022 Time 10:29 AM

## 2022-07-13 NOTE — Assessment & Plan Note (Signed)
Assessment: Continues to follow with Dr. Fuller Plan. In the past he has not refilled her plecanatide until she follows up with his office. She has an upcoming appointment  Plan: -follow up with Dr. Fuller Plan

## 2022-07-15 ENCOUNTER — Telehealth: Payer: Self-pay

## 2022-07-15 NOTE — Telephone Encounter (Signed)
A Prior Authorization was initiated for this patients BELSOMRA through CoverMyMeds.   ESL:[id number]

## 2022-07-20 ENCOUNTER — Ambulatory Visit: Payer: Medicaid Other | Admitting: Gastroenterology

## 2022-07-27 NOTE — Telephone Encounter (Signed)
Patient called regarding her PA for Belsomra, patient stated her insurance told her they wont cover the medication because she has not tried any of the alternative medications.patient wants to know if you can switch her to any of the alternative medications. Rosendo Gros have you received anything for PA for patient?

## 2022-08-06 ENCOUNTER — Other Ambulatory Visit: Payer: Self-pay | Admitting: Student

## 2022-08-06 DIAGNOSIS — M4316 Spondylolisthesis, lumbar region: Secondary | ICD-10-CM

## 2022-08-07 MED ORDER — RAMELTEON 8 MG PO TABS: 8.0000 mg | ORAL_TABLET | Freq: Every evening | ORAL | 1 refills | Status: DC | PRN

## 2022-08-07 NOTE — Addendum Note (Signed)
Addended by: Riesa Pope on: 08/07/2022 09:24 AM   Modules accepted: Orders

## 2022-08-08 ENCOUNTER — Telehealth: Payer: Self-pay

## 2022-08-08 ENCOUNTER — Other Ambulatory Visit: Payer: Self-pay | Admitting: *Deleted

## 2022-08-08 NOTE — Telephone Encounter (Signed)
Pa for pt ( RAMELTEON 8 MG TAB )  came through on cover my meds .Marland Kitchen Was submitted with last office notes ... Awaiting approval or denial ...        UPDATE :     [employer] is reviewing your PA request. Typically an electronic response will be received within 24-72 hours.

## 2022-08-08 NOTE — Telephone Encounter (Signed)
Pt states she need her sleep medication by today. Please call pt back.

## 2022-08-08 NOTE — Telephone Encounter (Signed)
Pt calling again about her medication for sleep. She stated her insurance will cover Ramelteon, Temazepam, and Eszopiclone. I told pt Dr Jeoffrey Massed had sent a rx for Ramelteon yesterday to her pharmacy. Stated she has not slept in 2 days and she will call the pharmacy.  She called the pharmacy who stated a PA is needed.  I talked to Lelon Frohlich who stated she sent a message to Dr Jeoffrey Massed.

## 2022-08-09 NOTE — Telephone Encounter (Signed)
DECISION :     [employer] [place] has reviewed the request for Ramelteon Tab  '8mg'$  submitted by [employer] on behalf of [name] on 08/08/2022.   After review, the request for service is: Approved through 02/06/2023.        ( COPY  SENT TO PHARMACY AND  PLACE TO SCAN TO CHART ALSO )

## 2022-08-11 ENCOUNTER — Ambulatory Visit: Payer: Medicaid Other | Admitting: Gastroenterology

## 2022-08-14 ENCOUNTER — Other Ambulatory Visit: Payer: Self-pay | Admitting: Neurology

## 2022-08-15 NOTE — Telephone Encounter (Signed)
Rx refilled.

## 2022-08-18 ENCOUNTER — Ambulatory Visit: Payer: Medicaid Other

## 2022-09-05 ENCOUNTER — Other Ambulatory Visit: Payer: Self-pay

## 2022-09-05 DIAGNOSIS — B20 Human immunodeficiency virus [HIV] disease: Secondary | ICD-10-CM

## 2022-09-05 MED ORDER — SYMTUZA 800-150-200-10 MG PO TABS: 1.0000 | ORAL_TABLET | Freq: Every day | ORAL | 0 refills | Status: DC

## 2022-09-12 ENCOUNTER — Ambulatory Visit: Payer: Medicaid Other | Admitting: Student

## 2022-09-12 VITALS — BP 130/72 | HR 75 | Temp 97.7°F | Ht 66.0 in | Wt 286.8 lb

## 2022-09-12 DIAGNOSIS — R251 Tremor, unspecified: Secondary | ICD-10-CM

## 2022-09-12 DIAGNOSIS — Z7985 Long-term (current) use of injectable non-insulin antidiabetic drugs: Secondary | ICD-10-CM | POA: Diagnosis not present

## 2022-09-12 DIAGNOSIS — E119 Type 2 diabetes mellitus without complications: Secondary | ICD-10-CM

## 2022-09-12 DIAGNOSIS — Z7984 Long term (current) use of oral hypoglycemic drugs: Secondary | ICD-10-CM

## 2022-09-12 DIAGNOSIS — G4700 Insomnia, unspecified: Secondary | ICD-10-CM

## 2022-09-12 DIAGNOSIS — F3341 Major depressive disorder, recurrent, in partial remission: Secondary | ICD-10-CM

## 2022-09-12 DIAGNOSIS — F32A Depression, unspecified: Secondary | ICD-10-CM

## 2022-09-12 DIAGNOSIS — I10 Essential (primary) hypertension: Secondary | ICD-10-CM | POA: Diagnosis not present

## 2022-09-12 MED ORDER — ZOLPIDEM TARTRATE 10 MG PO TABS: 10.0000 mg | ORAL_TABLET | Freq: Every evening | ORAL | 1 refills | Status: DC | PRN

## 2022-09-12 MED ORDER — FLUOXETINE HCL 20 MG PO CAPS: 20.0000 mg | ORAL_CAPSULE | Freq: Every day | ORAL | 2 refills | Status: DC

## 2022-09-12 NOTE — Patient Instructions (Signed)
Thank you, [name] for allowing [place] to provide your care today. Today we discussed   Insomnia We will restart your ambien. Please try to not use nightly. They will call you to schedule the sleep study  Diabetes Please decrease your ozempic to .5 mg weekly with you having vomiting since increasing the dose.   Depression/Anxiety I have sent in the prozac again. Please have the pharmacy call us if they say they don't have a prescription       I have ordered the following labs for you:  Lab Orders  No laboratory test(s) ordered today     Referrals ordered today:   Referral Orders  No referral(s) requested today     I have ordered the following medication/changed the following medications:   Stop the following medications: Medications Discontinued During This Encounter  Medication Reason   ramelteon (ROZEREM) 8 MG tablet    FLUoxetine (PROZAC) 20 MG capsule Reorder     Start the following medications: Meds ordered this encounter  Medications   FLUoxetine (PROZAC) 20 MG capsule    Sig: Take 1 capsule (20 mg total) by mouth daily.    Dispense:  90 capsule    Refill:  2   zolpidem (AMBIEN) 10 MG tablet    Sig: Take 1 tablet (10 mg total) by mouth at bedtime as needed for sleep. Try not to take nightly    Dispense:  30 tablet    Refill:  1     Follow up: 1 month  Should you have any questions or concerns please call the internal medicine clinic at [phone].    [name], D.O. [place]

## 2022-09-13 ENCOUNTER — Telehealth: Payer: Self-pay

## 2022-09-13 ENCOUNTER — Ambulatory Visit: Payer: Medicaid Other | Admitting: Neurology

## 2022-09-13 MED ORDER — DEXCOM G7 RECEIVER DEVI: 1.0000 | Freq: Every day | 0 refills | Status: AC

## 2022-09-13 MED ORDER — DEXCOM G7 SENSOR MISC: 1.0000 | 3 refills | Status: AC

## 2022-09-13 NOTE — Progress Notes (Signed)
Internal Medicine Clinic Attending  Case discussed with Dr. Katsadouros  At the time of the visit.  We reviewed the resident's history and exam and pertinent patient test results.  I agree with the assessment, diagnosis, and plan of care documented in the resident's note.  

## 2022-09-13 NOTE — Assessment & Plan Note (Signed)
Assessment: History of tremors, exacerbated by her anxiety/depression and if exacerbation of her insomnia. She follows with neurology and had a normal EEG in the past. She has follow up next week and encouraged her to follow up sooner if need be.   Plan: - continue to follow with neurology - continue to treat anxiety/depression and insomnia

## 2022-09-13 NOTE — Telephone Encounter (Addendum)
Decision:Approved Janey Genta (KeyLeonia Reeves) PA Case ID #[id number] Rx #[id number] Need Help? Call us at [phone] Outcome Approved today Request Reference Number: [id number]. [place] is approved through 09/13/2023. For further questions, call [employer] at [phone]. Authorization Expiration Date: 09/13/2023 Drug Dexcom G7 Sensor and Dexcom G7 Reciever ePA cloud logo Form [employer] Electronic Prior Authorization Form [phone] NCPDP) Original Claim Info 44

## 2022-09-13 NOTE — Telephone Encounter (Addendum)
Prior Authorization for patient (Dexcom G7 sensor/Dexcom G7 Chartered loss adjuster) came through on cover my meds was submitted with last office notes and labs awaiting approval or denial

## 2022-09-13 NOTE — Addendum Note (Signed)
Addended by: Riesa Pope on: 09/13/2022 09:03 AM   Modules accepted: Level of Service

## 2022-09-13 NOTE — Assessment & Plan Note (Signed)
Assessment: Was unable to pick up her prozac, called [place] who note the prescription was sent in but never filled. Unclear what occurred but will resend prescription. She is not interested in counseling.   Plan: - prozac 20 mg daily

## 2022-09-13 NOTE — Assessment & Plan Note (Signed)
Assessment: BP 130/72 today, not on any antihypertensives  Plan: - continue to monitor

## 2022-09-13 NOTE — Assessment & Plan Note (Signed)
>>  ASSESSMENT AND PLAN FOR TYPE 2 DIABETES MELLITUS (HCC) WRITTEN ON 09/13/2022 12:15 AM BY Belva Agee, MD  Assessment: Has not followed up with her endocrinologist since her last visit. Current regimen of ozempic, farxiga and glargine 30 U. She notes since increasing her ozempic to 1 mg weekly she has had nausea and vomiting mulitple times were week. Recommended she decrease the dose of ozempic to .5 mg and follow up with her endocrinologist for further adjustments  Plan: - decrease ozempic and see if nausea/voimting resolve - continue faxiga 10 mg daily and glargine 30 U nightly - CGM ordered

## 2022-09-13 NOTE — Assessment & Plan Note (Signed)
Assessment: Continues to struggle with insomnia, she notes not having slept for the last few evenings. We tried sending in [place] however it was not covered by her insurance. Ramelteon was tried and she denied any benefit from this.   We discussed expectations that these medications are not to be used nightly and when they are can lead to tolerance. I believe this is what occurred with her ambien. We will continue to work on sleep hygiene and she would like to [place]. Also pending sleep study, she has signs of sleep apnea and scored moderate risk on her stop-bang.   Plan: - continue to encourage sleep hygiene - restart ambien 10 mg as needed for sleep - work up reversible causes such as sleep apnea

## 2022-09-13 NOTE — Assessment & Plan Note (Signed)
Assessment: Has not followed up with her endocrinologist since her last visit. Current regimen of ozempic, farxiga and glargine 30 U. She notes since increasing her ozempic to 1 mg weekly she has had nausea and vomiting mulitple times were week. Recommended she decrease the dose of ozempic to .5 mg and follow up with her endocrinologist for further adjustments  Plan: - decrease ozempic and see if nausea/voimting resolve - continue faxiga 10 mg daily and glargine 30 U nightly - CGM ordered

## 2022-09-13 NOTE — Progress Notes (Signed)
CC: follow up depression/anxiety and insomnia  HPI:  [name] is a 57 y.o. female living with a history stated below and presents today for follow up in regards to her insomnia. Please see problem based assessment and plan for additional details.  Past Medical History:  Diagnosis Date   Acute gastritis without mention of hemorrhage 01/01/2002   Anemia 10/2009   macrocytic anemia with baseline MCV 104-106   Anxiety    Asthma    Boil 01/05/2017   Cellulitis 05/20/2020   Chondromalacia of right knee 04/20/2012   Complex tear of medial meniscus of left knee as current injury [id number]   Complication of anesthesia    hard to wake up   Constipation 11/17/2009   COPD (chronic obstructive pulmonary disease) (HCC)    chronic bronchitis   Derangement of anterior horn of lateral meniscus of left knee 02/25/2016   Duodenitis without mention of hemorrhage 01/01/2002   DVT (deep venous thrombosis) ([place])    history clot lt groin when she had lymphoma-in remision now.   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 Beryle Beams is her oncologist.    PUD (peptic ulcer disease) 07/1997   per EGD report 07/1997 with history of esophagitis   Restless leg syndrome 12/24/2015   Shingles    in lumbar dermatome   Spondylolisthesis of lumbar region    L5-S1   TMJ (dislocation of temporomandibular joint)    Traumatic tear of lateral meniscus of right knee [id number]   Umbilical bleeding [id number]   Vaginal yeast  infection 09/25/2017    Current Outpatient Medications on File Prior to Visit  Medication Sig Dispense Refill   albuterol (PROAIR HFA) 108 (90 Base) MCG/ACT inhaler USE 1 TO 2 PUFFS EVERY 4-6 HOURS AS NEEDED FOR SHORTNESS OF BREATH ORWHEEZING 8.5 g 1   azelastine (ASTELIN) 0.1 % nasal spray Place 2 sprays into both nostrils 2 (two) times daily. Use in each nostril as directed 30 mL 12   Beclomethasone Dipropionate 80 MCG/ACT AERS Place 2 Inhalers into the nose daily. 8.7 g 10   blood glucose meter kit and supplies Dispense based on patient and insurance preference. Use up to four times daily as directed. (FOR ICD-9 250.00, 250.01). 1 each 0   botulinum toxin Type A (BOTOX) 100 units SOLR injection INJECT UP TO 200 UNITS  INTRAMUSCULARLY EVERY 90 DAYS 2 each 3   Calcium Carbonate-Vitamin D (CALCIUM-VITAMIN D) 500-200 MG-UNIT per tablet Take 2 tablets by mouth daily.      Darunavir-Cobicistat-Emtricitabine-Tenofovir Alafenamide (SYMTUZA) 800-150-200-10 MG TABS Take 1 tablet by mouth daily with breakfast. 30 tablet 0   diclofenac Sodium (VOLTAREN) 1 % GEL APPLY TOPICALLY TO AFFECTED AREA 4 GRAMS 4 TIMES DAILY 100 g 2   FARXIGA 10 MG TABS tablet TAKE 1 TABLET BY MOUTH EVERY DAY 90 tablet 3   fluconazole (DIFLUCAN) 100 MG tablet Take 1 tablet (100 mg total) by mouth daily. 14 tablet 3   Galcanezumab-gnlm (EMGALITY) 120 MG/ML SOAJ INJECT 120 MG  INTO SKIN ONCE A MONTH AS DIRECTED 1 mL 3   glucose blood test strip Use as instructed 100 each 12   Insulin Glargine (BASAGLAR KWIKPEN) 100 UNIT/ML SMARTSIG:30 Unit(s) SUB-Q Daily     mometasone-formoterol (DULERA) 100-5 MCG/ACT AERO USE 2 PUFFS BY MOUTH THE FIRST THING IN THE MORNING AND THEN ANOTHER 2 PUFFS ABOUT 12 HOURS LATER 13 g 5   OZEMPIC, 0.25 OR 0.5 MG/DOSE, 2 MG/1.5ML SOPN Inject 0.5 mg into the skin once a week.     pantoprazole (PROTONIX) 40 MG tablet TAKE 1 TABLET BY MOUTH 2 TIMES A DAY FORACID REFLUX 60 tablet 4   Plecanatide (TRULANCE) 3 MG  TABS Take 1 tablet by mouth daily. 30 tablet 0   propranolol (INDERAL) 20 MG tablet Take 1 tablet (20 mg total) by mouth 2 (two) times daily. 180 tablet 1   rosuvastatin (CRESTOR) 20 MG tablet TAKE 1 TABLET BY MOUTH EVERY DAY FOR CHOLESTEROL 90 tablet 3   Tamsulosin HCl (FLOMAX) 0.4 MG CAPS Take 0.4 mg by mouth daily. 1-2 daily     terconazole (TERAZOL 7) 0.4 % vaginal cream PLACE 1 APPLICATORFUL VAGINALLY AT BEDTIME 45 g 3   tiZANidine (ZANAFLEX) 4 MG tablet TAKE 1 TABLET BY MOUTH EVERY 8 HOURS AS NEEDED FOR MUSCLE SPASMS 90 tablet 11   Ubrogepant (UBRELVY) 100 MG TABS Take 100 mg by mouth as needed (take 1 at onset of headache, may repeat 2 hours later if needed, max is 2 tablets in 24 hours). 10 tablet 11   vitamin B-12 (CYANOCOBALAMIN) 1000 MCG tablet Take 1,000 mcg by mouth daily.     No current facility-administered medications on file prior to visit.    Family History  Problem Relation Age of Onset   Hypertension Mother    Stroke Mother    Heart disease Mother    Diabetes Mother    Colon polyps Mother    Breast cancer Paternal Grandmother    Kidney cancer Maternal Uncle    Heart disease Maternal Uncle    Diabetes Maternal Uncle    Diabetes Maternal Grandmother    Heart disease Maternal Grandmother    Emphysema Paternal Uncle        never smoker   Emphysema Paternal Grandfather        never smoker   Colon cancer Paternal Aunt        with met to lung    Non-Hodgkin's lymphoma Maternal Uncle    Heart disease Maternal Grandfather     Social History   Socioeconomic History   Marital status: Divorced    Spouse name: Not on file   Number of children: 0   Years of education: 12   Highest education level: Not on file  Occupational History   Occupation: disabled    Fish farm manager: UNEMPLOYED  Tobacco Use   Smoking status: Never   Smokeless tobacco: Never  Vaping Use   Vaping Use: Never used  Substance and Sexual Activity   Alcohol use: No    Alcohol/week: 0.0 standard  drinks of alcohol   Drug use: No   Sexual activity: Not Currently    Partners: Male    Birth control/protection: Surgical  Other Topics Concern   Not on file  Social History Narrative      Patient is disabled.   Right handed.   Caffeine - Diet soda., tea   Education - high school   Social Determinants of Health   Financial Resource Strain: Not on file  Food Insecurity:  Not on file  Transportation Needs: Not on file  Physical Activity: Not on file  Stress: Not on file  Social Connections: Not on file  Intimate Partner Violence: Not on file    Review of Systems: ROS negative except for what is noted on the assessment and plan.  Vitals:   09/12/22 1314  BP: 130/72  Pulse: 75  Temp: 97.7 F (36.5 C)  TempSrc: Oral  SpO2: 98%  Weight: 286 lb 12.8 oz (130.1 kg)  Height: '5\' 6"'$  (1.676 m)    Physical Exam: Constitutional: well-appearing, in no acute distress HENT: normocephalic atraumatic Eyes: conjunctiva non-erythematous Neck: supple Cardiovascular: regular rate and rhythm, no m/r/g Pulmonary/Chest: normal work of breathing on room air, lungs clear to auscultation bilaterally MSK: normal bulk and tone Neurological: alert & oriented x 3 Skin: warm and dry Psych: normal mood  Assessment & Plan:   Hypertension Assessment: BP 130/72 today, not on any antihypertensives  Plan: - continue to monitor  Type 2 diabetes mellitus ([place]) Assessment: Has not followed up with her endocrinologist since her last visit. Current regimen of ozempic, farxiga and glargine 30 U. She notes since increasing her ozempic to 1 mg weekly she has had nausea and vomiting mulitple times were week. Recommended she decrease the dose of ozempic to .5 mg and follow up with her endocrinologist for further adjustments  Plan: - decrease ozempic and see if nausea/voimting resolve - continue faxiga 10 mg daily and glargine 30 U nightly - CGM ordered  Insomnia Assessment: Continues to struggle  with insomnia, she notes not having slept for the last few evenings. We tried sending in [place] however it was not covered by her insurance. Ramelteon was tried and she denied any benefit from this.   We discussed expectations that these medications are not to be used nightly and when they are can lead to tolerance. I believe this is what occurred with her ambien. We will continue to work on sleep hygiene and she would like to [place]. Also pending sleep study, she has signs of sleep apnea and scored moderate risk on her stop-bang.   Plan: - continue to encourage sleep hygiene - restart ambien 10 mg as needed for sleep - work up reversible causes such as sleep apnea  Depression Assessment: Was unable to pick up her prozac, called [facility] who note the prescription was sent in but never filled. Unclear what occurred but will resend prescription. She is not interested in counseling.   Plan: - prozac 20 mg daily  Tremor Assessment: History of tremors, exacerbated by her anxiety/depression and if exacerbation of her insomnia. She follows with neurology and had a normal EEG in the past. She has follow up next week and encouraged her to follow up sooner if need be.   Plan: - continue to follow with neurology - continue to treat anxiety/depression and insomnia  Patient discussed with Dr. Laurena Slimmer, D.O. [facility], PGY-3 Phone: [phone] Date 09/13/2022 Time 12:21 AM

## 2022-09-20 ENCOUNTER — Ambulatory Visit: Payer: Medicaid Other | Admitting: Neurology

## 2022-09-27 ENCOUNTER — Ambulatory Visit: Payer: Medicaid Other | Admitting: Neurology

## 2022-09-29 ENCOUNTER — Other Ambulatory Visit: Payer: Self-pay | Admitting: Student

## 2022-09-29 ENCOUNTER — Other Ambulatory Visit: Payer: Self-pay | Admitting: Infectious Disease

## 2022-09-29 ENCOUNTER — Telehealth: Payer: Self-pay | Admitting: Infectious Disease

## 2022-09-29 ENCOUNTER — Telehealth: Payer: Self-pay

## 2022-09-29 ENCOUNTER — Encounter: Payer: Medicaid Other | Admitting: Student

## 2022-09-29 DIAGNOSIS — B20 Human immunodeficiency virus [HIV] disease: Secondary | ICD-10-CM

## 2022-09-29 NOTE — Telephone Encounter (Signed)
Unable to reach [name] this evening, she had identifying voicemail. Informed her she has one refill of her [place]. Will need to clarify referral to sleep specialist. She needs sleep study as she is high risk for sleep apnea.

## 2022-09-29 NOTE — Telephone Encounter (Signed)
[name] called to reschedule due to conflicting appointments. She also requested a refill on her Symtuza prescription.

## 2022-09-29 NOTE — Telephone Encounter (Signed)
Returned pt's call - stated she wants to speak to Dr Johnney Ou. Stated she missed her appt today d/t migraine; she has been throwing up. She needs refills on Ambien. Also stated her doctor suppose to be sending her to a sleep specialist to discuss medications. Stated he can call her back if he has any questions.

## 2022-09-29 NOTE — Telephone Encounter (Signed)
Spoke with [name], advised that she cannot take Ubrelvy with her Symtuza. Advised her to reach out to PCP for migraine medication alternative. Discussed that Symtuza can increase concentration of Ubrelvy and cause toxicity, she has not yet picked up the [place].   Beryle Flock, RN

## 2022-09-29 NOTE — Telephone Encounter (Signed)
Uptodate and Micromedex show that [place] with Symtuza are contraindicated together due to significantly increased concentrations of Ubrelvy and risk for toxicity. She will need to find an alternative with internal medicine. I didn't see any fill history of Ubrelvy, so hopefully she hasn't needed to use it. Thanks for checking Jinny Blossom! - Estill Bamberg

## 2022-09-29 NOTE — Telephone Encounter (Signed)
Requesting to speak with Dr. Johnney Ou. Please call pt back.

## 2022-09-29 NOTE — Telephone Encounter (Signed)
Please advise regarding DDI with Roselyn Meier, thanks!

## 2022-10-04 ENCOUNTER — Ambulatory Visit: Payer: Medicaid Other | Admitting: Neurology

## 2022-10-04 ENCOUNTER — Other Ambulatory Visit: Payer: Self-pay | Admitting: Infectious Disease

## 2022-10-04 VITALS — BP 131/84 | HR 89 | Ht 66.0 in | Wt 286.0 lb

## 2022-10-04 DIAGNOSIS — G43711 Chronic migraine without aura, intractable, with status migrainosus: Secondary | ICD-10-CM

## 2022-10-04 DIAGNOSIS — B20 Human immunodeficiency virus [HIV] disease: Secondary | ICD-10-CM

## 2022-10-04 MED ORDER — ONABOTULINUMTOXINA 200 UNITS IJ SOLR
200.0000 [IU] | Freq: Once | INTRAMUSCULAR | Status: AC
  Administered 2022-10-04: 200 [IU] via INTRAMUSCULAR

## 2022-10-04 MED ORDER — TIZANIDINE HCL 4 MG PO TABS: 4.0000 mg | ORAL_TABLET | Freq: Three times a day (TID) | ORAL | 3 refills | Status: DC | PRN

## 2022-10-04 MED ORDER — AJOVY 225 MG/1.5ML ~~LOC~~ SOAJ: 225.0000 mg | SUBCUTANEOUS | 11 refills | Status: DC

## 2022-10-04 MED ORDER — PROPRANOLOL HCL 20 MG PO TABS: 20.0000 mg | ORAL_TABLET | Freq: Two times a day (BID) | ORAL | 1 refills | Status: DC

## 2022-10-04 MED ORDER — ZOLMITRIPTAN 5 MG PO TABS: 5.0000 mg | ORAL_TABLET | ORAL | 11 refills | Status: DC | PRN

## 2022-10-04 NOTE — Progress Notes (Signed)
BOTOX PROCEDURE NOTE FOR MIGRAINE HEADACHE  HISTORY: Here today for Botox. Having more headaches with allergies, TMJ. Dealing with clusters of migraines for 1st few weeks. The Botox keeps her from constant vomiting. She couldn't take the Ubrelvy due to interaction with HIV medications. Needs PA on Emgality. Remains on propranolol 20 mg twice daily, helps with pulsing in her ears with migraines. Zomig works the best for relief. Has daily headache, 5-6 migraines a month.  Description of procedure:  The patient was placed in a sitting position. The standard protocol was used for Botox as follows, with 5 units of Botox injected at each site:  -Procerus muscle, deferred per patient due to forehead not moving  -Corrugator muscle, deferred per patient due to forehead not moving  -Frontalis muscle, bilateral injection, with 2 sites each side, medial injection was performed in the upper one third of the frontalis muscle, in the region vertical from the medial inferior edge of the superior orbital rim. The lateral injection was again in the upper one third of the forehead vertically above the lateral limbus of the cornea, 1.5 cm lateral to the medial injection site.  -Temporalis muscle injection, 4 sites, bilaterally. The first injection was 3 cm above the tragus of the ear, second injection site was 1.5 cm to 3 cm up from the first injection site in line with the tragus of the ear. The third injection site was 1.5-3 cm forward between the first 2 injection sites. The fourth injection site was 1.5 cm posterior to the second injection site.  -Occipitalis muscle injection, 3 sites, bilaterally. The first injection was done one half way between the occipital protuberance and the tip of the mastoid process behind the ear. The second injection site was done lateral and superior to the first, 1 fingerbreadth from the first injection. The third injection site was 1 fingerbreadth superiorly and medially from the  first injection site.  -Cervical paraspinal muscle injection, 2 sites, bilateral, the first injection site was 1 cm from the midline of the cervical spine, 3 cm inferior to the lower border of the occipital protuberance. The second injection site was 1.5 cm superiorly and laterally to the first injection site.  -Trapezius muscle injection was performed at 3 sites, bilaterally. The first injection site was in the upper trapezius muscle halfway between the inflection point of the neck, and the acromion. The second injection site was one half way between the acromion and the first injection site. The third injection was done between the first injection site and the inflection point of the neck.   A 200 unit bottle of Botox was used, 140 units were injected, the rest of the Botox was wasted. The patient tolerated the procedure well, there were no complications of the above procedure.  Botox NDC [id number] Lot number [id number] Expiration date 10/2024 SP  I discontinued the Roselyn Meier due to potential side effect of HIV medications.  Refilled Zomig and propranolol.  We discussed stopping Emgality switching to Ajovy to see if better benefit.  Will continue Botox.  I refilled tizanidine, she was getting 90 tablets filled for a year, I reduce it to 30 tablets.  Meds ordered this encounter  Medications   botulinum toxin Type A (BOTOX) injection 200 Units   zolmitriptan (ZOMIG) 5 MG tablet    Sig: Take 1 tablet (5 mg total) by mouth as needed for migraine.    Dispense:  10 tablet    Refill:  11   Fremanezumab-vfrm (AJOVY) 225 MG/1.5ML  SOAJ    Sig: Inject 225 mg into the skin every 30 (thirty) days.    Dispense:  1.68 mL    Refill:  11   propranolol (INDERAL) 20 MG tablet    Sig: Take 1 tablet (20 mg total) by mouth 2 (two) times daily.    Dispense:  180 tablet    Refill:  1   tiZANidine (ZANAFLEX) 4 MG tablet    Sig: Take 1 tablet (4 mg total) by mouth every 8 (eight) hours as needed for muscle  spasms.    Dispense:  30 tablet    Refill:  3

## 2022-10-04 NOTE — Patient Instructions (Signed)
Switch to Ajovy, instead of Emgality, continue the Botox

## 2022-10-04 NOTE — Progress Notes (Signed)
Botox 100 units x 2 vials  [id number] [id number] Exp-10/2024 S/P

## 2022-10-13 ENCOUNTER — Ambulatory Visit: Payer: Medicaid Other | Admitting: Infectious Disease

## 2022-10-24 ENCOUNTER — Ambulatory Visit: Payer: Medicaid Other | Admitting: Infectious Disease

## 2022-10-25 ENCOUNTER — Other Ambulatory Visit: Payer: Self-pay | Admitting: Student

## 2022-10-25 ENCOUNTER — Other Ambulatory Visit: Payer: Self-pay | Admitting: Infectious Disease

## 2022-10-25 DIAGNOSIS — M4316 Spondylolisthesis, lumbar region: Secondary | ICD-10-CM

## 2022-10-25 DIAGNOSIS — B20 Human immunodeficiency virus [HIV] disease: Secondary | ICD-10-CM

## 2022-10-25 NOTE — Telephone Encounter (Signed)
Last dispensed 4/2, patient will receive refills at 4/30 appointment.   Sandie Ano, RN

## 2022-10-26 ENCOUNTER — Telehealth: Payer: Self-pay | Admitting: Neurology

## 2022-10-26 MED ORDER — ZOLMITRIPTAN 5 MG PO TABS: 5.0000 mg | ORAL_TABLET | ORAL | 11 refills | Status: DC | PRN

## 2022-10-26 MED ORDER — VITAMIN B-12 1000 MCG PO TABS: 1000.0000 ug | ORAL_TABLET | Freq: Every day | ORAL | 0 refills | Status: DC

## 2022-10-26 NOTE — Telephone Encounter (Signed)
Pt is asking for her zolmitriptan (ZOMIG) 5 MG tablet to go from 10 back to 12 pills again.  Re: her tiZANidine (ZANAFLEX) 4 MG tablet pt is wanting to go from 30 pills to a 90 day

## 2022-10-26 NOTE — Addendum Note (Signed)
Addended by: Danne Harbor on: 10/26/2022 03:06 PM   Modules accepted: Orders

## 2022-10-26 NOTE — Telephone Encounter (Signed)
[facility] notify that the meds were changed per her request and sent to pharmacy

## 2022-10-26 NOTE — Telephone Encounter (Signed)
Called pt. Informed her that meds were changed per her request and sent to pharmacy.

## 2022-10-31 ENCOUNTER — Other Ambulatory Visit: Payer: Self-pay | Admitting: Infectious Disease

## 2022-10-31 DIAGNOSIS — B20 Human immunodeficiency virus [HIV] disease: Secondary | ICD-10-CM

## 2022-10-31 NOTE — Telephone Encounter (Signed)
Appointment on 4/30 with Dr.Van Nada Maclachlan

## 2022-11-01 ENCOUNTER — Ambulatory Visit: Payer: Medicaid Other | Admitting: Infectious Disease

## 2022-11-01 ENCOUNTER — Encounter: Payer: Self-pay | Admitting: Neurology

## 2022-11-01 ENCOUNTER — Telehealth: Payer: Self-pay | Admitting: Neurology

## 2022-11-01 NOTE — Telephone Encounter (Signed)
Unable to reach pt over the phone, sent letter in mail informing pt of need to reschedule 11/24/22 appt - MD out

## 2022-11-04 ENCOUNTER — Other Ambulatory Visit: Payer: Self-pay | Admitting: Student

## 2022-11-21 ENCOUNTER — Other Ambulatory Visit: Payer: Self-pay | Admitting: Neurology

## 2022-11-21 NOTE — Telephone Encounter (Signed)
error 

## 2022-11-24 ENCOUNTER — Ambulatory Visit: Payer: Medicaid Other | Admitting: Neurology

## 2022-11-30 ENCOUNTER — Other Ambulatory Visit: Payer: Self-pay | Admitting: Infectious Disease

## 2022-11-30 DIAGNOSIS — B20 Human immunodeficiency virus [HIV] disease: Secondary | ICD-10-CM

## 2022-12-05 ENCOUNTER — Encounter: Payer: Self-pay | Admitting: Student

## 2022-12-08 ENCOUNTER — Ambulatory Visit: Payer: Medicaid Other | Admitting: Infectious Disease

## 2022-12-12 ENCOUNTER — Telehealth: Payer: Self-pay | Admitting: Neurology

## 2022-12-12 NOTE — Telephone Encounter (Signed)
Faxed PA form to [facility] @ [phone].

## 2022-12-12 NOTE — Telephone Encounter (Signed)
New auth needed before 7/3 Botox appt, completed [facility] Medicaid renewal form, placed in nurse pod for NP signature.

## 2022-12-13 NOTE — Telephone Encounter (Signed)
Received approval: [id number] (12/12/22-12/12/23).

## 2022-12-19 ENCOUNTER — Other Ambulatory Visit: Payer: Self-pay

## 2022-12-19 ENCOUNTER — Other Ambulatory Visit: Payer: Self-pay | Admitting: Neurology

## 2022-12-19 NOTE — Telephone Encounter (Signed)
Called pt - stated she's aware it's too early (due 7/3); stated she wants to be sure it will be ready on this date. The pharmacy can hold rx and place it "on file" until it's due.

## 2022-12-19 NOTE — Telephone Encounter (Signed)
Pt is requesting her  zolpidem (AMBIEN) 10 MG tablet     Sent to [facility] - [place], [place] - [address]

## 2022-12-19 NOTE — Telephone Encounter (Signed)
Pt request refill for tiZANidine (ZANAFLEX) 4 MG tablet sent to  [facility]

## 2022-12-20 ENCOUNTER — Other Ambulatory Visit: Payer: Self-pay | Admitting: Neurology

## 2022-12-20 ENCOUNTER — Other Ambulatory Visit: Payer: Self-pay | Admitting: Infectious Disease

## 2022-12-20 ENCOUNTER — Other Ambulatory Visit: Payer: Self-pay | Admitting: Student

## 2022-12-20 DIAGNOSIS — B20 Human immunodeficiency virus [HIV] disease: Secondary | ICD-10-CM

## 2022-12-20 MED ORDER — ZOLPIDEM TARTRATE 10 MG PO TABS: ORAL_TABLET | ORAL | 1 refills | Status: DC

## 2022-12-20 NOTE — Telephone Encounter (Signed)
Rx refilled.

## 2022-12-20 NOTE — Telephone Encounter (Signed)
Last dispensed 5/29 - additional refills to be provided at 6/24 appointment.   Sandie Ano, RN

## 2022-12-25 NOTE — Progress Notes (Unsigned)
No show

## 2022-12-26 ENCOUNTER — Ambulatory Visit (INDEPENDENT_AMBULATORY_CARE_PROVIDER_SITE_OTHER): Payer: Self-pay | Admitting: Infectious Disease

## 2022-12-26 DIAGNOSIS — M2550 Pain in unspecified joint: Secondary | ICD-10-CM

## 2022-12-26 DIAGNOSIS — B20 Human immunodeficiency virus [HIV] disease: Secondary | ICD-10-CM

## 2022-12-26 DIAGNOSIS — E785 Hyperlipidemia, unspecified: Secondary | ICD-10-CM

## 2022-12-26 DIAGNOSIS — E119 Type 2 diabetes mellitus without complications: Secondary | ICD-10-CM

## 2023-01-02 ENCOUNTER — Encounter: Payer: Medicaid Other | Admitting: Student

## 2023-01-02 ENCOUNTER — Other Ambulatory Visit: Payer: Self-pay | Admitting: Infectious Disease

## 2023-01-02 DIAGNOSIS — B20 Human immunodeficiency virus [HIV] disease: Secondary | ICD-10-CM

## 2023-01-04 ENCOUNTER — Other Ambulatory Visit: Payer: Self-pay

## 2023-01-04 ENCOUNTER — Telehealth: Payer: Self-pay

## 2023-01-04 ENCOUNTER — Ambulatory Visit: Payer: Medicaid Other | Admitting: Neurology

## 2023-01-04 VITALS — BP 138/79

## 2023-01-04 DIAGNOSIS — G43711 Chronic migraine without aura, intractable, with status migrainosus: Secondary | ICD-10-CM

## 2023-01-04 MED ORDER — TIZANIDINE HCL 4 MG PO TABS: 4.0000 mg | ORAL_TABLET | Freq: Three times a day (TID) | ORAL | 1 refills | Status: DC | PRN

## 2023-01-04 MED ORDER — AJOVY 225 MG/1.5ML ~~LOC~~ SOAJ: 225.0000 mg | SUBCUTANEOUS | 0 refills | Status: DC

## 2023-01-04 MED ORDER — ONABOTULINUMTOXINA 200 UNITS IJ SOLR
155.0000 [IU] | Freq: Once | INTRAMUSCULAR | Status: AC
Start: 2023-01-04 — End: 2023-01-04
  Administered 2023-01-04: 155 [IU] via INTRAMUSCULAR

## 2023-01-04 MED ORDER — AJOVY 225 MG/1.5ML ~~LOC~~ SOAJ: 225.0000 mg | SUBCUTANEOUS | 11 refills | Status: DC

## 2023-01-04 MED ORDER — FREMANEZUMAB-VFRM 225 MG/1.5ML ~~LOC~~ SOSY
225.0000 mg | PREFILLED_SYRINGE | Freq: Once | SUBCUTANEOUS | Status: DC
Start: 2023-01-04 — End: 2023-01-04

## 2023-01-04 NOTE — Progress Notes (Signed)
BOTOX PROCEDURE NOTE FOR MIGRAINE HEADACHE  HISTORY: [name] is here for Botox. Today is her birthday.  Last was 10/04/2022 by me. I ordered Ajovy last visit, she stopped Emgality, was not able to get the Ajovy? Still on propranolol, taking tizanidine 4 mg every 8 hours. Takes Zomig. Feels more headache without CGRP injection. With Botox still has daily headache, will have 2 migraines a week, when it wears off, daily migraine. Has been in the bed for days. The Botox still works because she doesn't hug the toilet or have diarrhea with migraine.    Description of procedure:  The patient was placed in a sitting position. The standard protocol was used for Botox as follows, with 5 units of Botox injected at each site:  -Procerus muscle, midline injection  -Corrugator muscle, bilateral injection  -Frontalis muscle, bilateral injection, with 2 sites each side, medial injection was performed in the upper one third of the frontalis muscle, in the region vertical from the medial inferior edge of the superior orbital rim. The lateral injection was again in the upper one third of the forehead vertically above the lateral limbus of the cornea, 1.5 cm lateral to the medial injection site.  -Temporalis muscle injection, 4 sites, bilaterally. The first injection was 3 cm above the tragus of the ear, second injection site was 1.5 cm to 3 cm up from the first injection site in line with the tragus of the ear. The third injection site was 1.5-3 cm forward between the first 2 injection sites. The fourth injection site was 1.5 cm posterior to the second injection site.  -Occipitalis muscle injection, 3 sites, bilaterally. The first injection was done one half way between the occipital protuberance and the tip of the mastoid process behind the ear. The second injection site was done lateral and superior to the first, 1 fingerbreadth from the first injection. The third injection site was 1 fingerbreadth superiorly and  medially from the first injection site.  -Cervical paraspinal muscle injection, 2 sites, bilateral, the first injection site was 1 cm from the midline of the cervical spine, 3 cm inferior to the lower border of the occipital protuberance. The second injection site was 1.5 cm superiorly and laterally to the first injection site.  -Trapezius muscle injection was performed at 3 sites, bilaterally. The first injection site was in the upper trapezius muscle halfway between the inflection point of the neck, and the acromion. The second injection site was one half way between the acromion and the first injection site. The third injection was done between the first injection site and the inflection point of the neck.   A 200 unit bottle of Botox was used, 155 units were injected, the rest of the Botox was wasted. The patient tolerated the procedure well, there were no complications of the above procedure.  Botox NDC 1610-9604-54 Lot number [id number] Expiration date 10/2024 SP  I reordered Ajovy and sent to her pharmacy.  She was given Ajovy sample injection today in office.  I refilled her tizanidine with refills. She is seeing Dr. Terrace Arabia in September. I didn't have any knowledge that she wasn't able to get Ajovy, I don't see any phone calls or requests for PA. I told her to call me next time and sign up for my chart to let me know if she needs something.  Meds ordered this encounter  Medications   botulinum toxin Type A (BOTOX) injection 155 Units    Botox- 100 units x 2 vials Lot:  [id number] Expiration: 04.2026 NDC: 6962-9528-41   Fremanezumab-vfrm (AJOVY) 225 MG/1.5ML SOAJ    Sig: Inject 225 mg into the skin every 30 (thirty) days.    Dispense:  1.68 mL    Refill:  11   tiZANidine (ZANAFLEX) 4 MG tablet    Sig: Take 1 tablet (4 mg total) by mouth every 8 (eight) hours as needed for muscle spasms.    Dispense:  90 tablet    Refill:  1    Refills on file

## 2023-01-04 NOTE — Telephone Encounter (Signed)
Verbal order from [facility] NP to administer Sample of Ajovy given in patients right upper arm subcutaneous tissue back side of arm. Patient tolerated well. Ajovy 225mg  Exp 11/2024 Ncd 16109-604-54 Lot [id number]

## 2023-01-04 NOTE — Progress Notes (Signed)
Botox- 100 units x 2 vials Lot: [id number] Expiration: 04.2026 NDC: 5621-3086-57  Bacteriostatic 0.9% Sodium Chloride- 4 mL  Lot: [id number] Expiration: 11.01.2025 NDC: 9528413244  Dx: g43.711 S/P  Witnessed by April, RN

## 2023-01-09 ENCOUNTER — Encounter: Payer: Medicaid Other | Admitting: Student

## 2023-01-10 DIAGNOSIS — E1165 Type 2 diabetes mellitus with hyperglycemia: Secondary | ICD-10-CM | POA: Diagnosis not present

## 2023-01-10 DIAGNOSIS — Z794 Long term (current) use of insulin: Secondary | ICD-10-CM | POA: Diagnosis not present

## 2023-01-11 ENCOUNTER — Other Ambulatory Visit (HOSPITAL_COMMUNITY): Payer: Self-pay

## 2023-01-11 ENCOUNTER — Telehealth: Payer: Self-pay

## 2023-01-11 NOTE — Telephone Encounter (Signed)
Pharmacy Patient Advocate Encounter   Received notification from [facility] that prior authorization for Ajovy is required/requested.   PA submitted to [facility] MEDICAID via CoverMyMeds Key or (Medicaid) confirmation # [id number] Status is pending

## 2023-01-18 ENCOUNTER — Encounter: Payer: Medicaid Other | Admitting: Student

## 2023-01-24 NOTE — Telephone Encounter (Signed)
Pharmacy Patient Advocate Encounter  Received notification from [facility] that Prior Authorization for AJOVY (fremanezumab-vfrm) injection 225MG /1.5ML auto-injectors has been APPROVED from 01/11/2023 to 04/13/2023.Marland Kitchen  PA #/Case ID/Reference #: PA Case ID #: [id number]

## 2023-01-26 ENCOUNTER — Encounter: Payer: Medicaid Other | Admitting: Student

## 2023-01-26 ENCOUNTER — Ambulatory Visit (INDEPENDENT_AMBULATORY_CARE_PROVIDER_SITE_OTHER): Payer: Medicaid Other | Admitting: Internal Medicine

## 2023-01-26 ENCOUNTER — Telehealth: Payer: Self-pay | Admitting: *Deleted

## 2023-01-26 ENCOUNTER — Other Ambulatory Visit: Payer: Self-pay | Admitting: *Deleted

## 2023-01-26 DIAGNOSIS — R251 Tremor, unspecified: Secondary | ICD-10-CM

## 2023-01-26 NOTE — Telephone Encounter (Signed)
Call to patient informed that she has been scheduled for a 10;45 AM TeleHealth appointment and to expect a call in a from Dr. Sloan Leiter.

## 2023-01-26 NOTE — Progress Notes (Signed)
I connected with  [name] on 01/26/23 by telephone and verified that I am speaking with the correct person using two identifiers.   I discussed the limitations of evaluation and management by telemedicine. The patient expressed understanding and agreed to proceed.  CC: tremors  This is a telephone encounter between [name] and [employer] on 01/26/2023 for tremors. The visit was conducted with the patient located at home and [name] at [facility]. The patient's identity was confirmed using their DOB and current address. The patient has consented to being evaluated through a telephone encounter and understands the associated risks (an examination cannot be done and the patient may need to come in for an appointment) / benefits (allows the patient to remain at home, decreasing exposure to coronavirus). I personally spent 10 minutes on medical discussion.   HPI:  [name] is a 57 y.o. with PMH as below.   Please see A&P for assessment of the patient's acute and chronic medical conditions.   Past Medical History:  Diagnosis Date   Acute gastritis without mention of hemorrhage 01/01/2002   Anemia 10/2009   macrocytic anemia with baseline MCV 104-106   Anxiety    Asthma    Boil 01/05/2017   Cellulitis 05/20/2020   Chondromalacia of right knee 04/20/2012   Complex tear of medial meniscus of left knee as current injury 02/25/2016   Complication of anesthesia    hard to wake up   Constipation 11/17/2009   COPD (chronic obstructive pulmonary disease) (HCC)    chronic bronchitis   Derangement of anterior horn of lateral meniscus of left knee 02/25/2016   Duodenitis without mention of hemorrhage 01/01/2002   DVT (deep venous thrombosis) (HCC)    history clot lt groin when she had lymphoma-in remision now.   Elevated triglycerides with high cholesterol 01/05/2017   Erythema 09/01/2021   Gastroparesis    GERD (gastroesophageal reflux disease)    Hiatal hernia 01/01/2002    HIV infection (HCC)    CD4 = 570 (05/2010), VL undetectation   Hyperlipidemia    IBS (irritable bowel syndrome)    Migraines    on topamax and triptans, frequent (almost daily) attacks    MRSA (methicillin resistant staph aureus) culture positive 12/20/2010   Non Hodgkin's lymphoma (HCC)    stage II, s/p resection, chemotherapy, radiotherapy, Dr. Cyndie Chime is her oncologist.    PUD (peptic ulcer disease) 07/1997   per EGD report 07/1997 with history of esophagitis   Restless leg syndrome 12/24/2015   Shingles    in lumbar dermatome   Spondylolisthesis of lumbar region    L5-S1   TMJ (dislocation of temporomandibular joint)    Traumatic tear of lateral meniscus of right knee 04/20/2012   Umbilical bleeding 09/01/2021   Vaginal yeast infection 09/25/2017   Review of Systems:  diffuse weakness, tremors, slurred speech  Assessment & Plan:   Tremor She called this morning for visit as she is having worsening tremors. She woke up around 2AM and noted difficulty speaking in addition to tremors. She is oriented to self, time, and person. For the tremors, these occur at rest and with movement. They are located in her entire body. She has felt weakness throughout her upper an lower extremities and feels unsteady when she tries to walk. Her mother is with her on phone and endorses noting changes in her speech today.  Patient does feel that when she has tremors she typically has trouble talking. She wears  a dexcom to monitor her blood sugars and sugars have been elevated in 400s. She was recently started on meal time insulin which she has been taking. She also takes glargine at night. Denies polydipsia and polyuria. Prior EEG completed by neurology 12/22 and  was normal. A: Unable to perform neuro exam with limitations of telehealth visit. She has someone with her who will drive her to the closest emergency department which is in [place]. Highest concern would be if she has a CVA, which she  does have risk factors for. I am less concerned for seizure as she does not appear to be post-ictal and tremors are ongoing since 2AM. She is on several central acting medications. P: Patient will have someone take her to closest ED.    Patient discussed with Dr. Josetta Huddle Tekelia Kareem, D.O. [facility]  PGY-2 Pager: [phone]  Phone: [phone] Date 01/26/2023  Time 11:36 AM

## 2023-01-26 NOTE — Telephone Encounter (Signed)
Call from patient states Tremors are bad today unable to come in for appointment.  Unsure as to how appointment for today was cancelled.  Patient was advised that will see if 1 of the doctors can call her today.  Has a Neurology appointment in September. States feels safe during her tremors.  Will await call from her doctor today.

## 2023-01-26 NOTE — Assessment & Plan Note (Addendum)
She called this morning for visit as she is having worsening tremors. She woke up around 2AM and noted difficulty speaking in addition to tremors. She is oriented to self, time, and person. For the tremors, these occur at rest and with movement. They are located in her entire body. She has felt weakness throughout her upper an lower extremities and feels unsteady when she tries to walk. Her mother is with her on phone and endorses noting changes in her speech today.  Patient does feel that when she has tremors she typically has trouble talking. She wears a dexcom to monitor her blood sugars and sugars have been elevated in 400s. She was recently started on meal time insulin which she has been taking. She also takes glargine at night. Denies polydipsia and polyuria. Prior EEG completed by neurology 12/22 and  was normal. A: Unable to perform neuro exam with limitations of telehealth visit. She has someone with her who will drive her to the closest emergency department which is in [place]. Highest concern would be if she has a CVA, which she does have risk factors for. I am less concerned for seizure as she does not appear to be post-ictal and tremors are ongoing since 2AM. She is on several central acting medications. P: Patient will have someone take her to closest ED.

## 2023-01-27 NOTE — Progress Notes (Signed)
Internal Medicine Attending:  Discussed with the resident and agree with the resident's findings and plan of care as documented in the resident's note.  Tyson Alias, MD

## 2023-01-30 ENCOUNTER — Other Ambulatory Visit: Payer: Self-pay | Admitting: Infectious Disease

## 2023-01-30 DIAGNOSIS — B20 Human immunodeficiency virus [HIV] disease: Secondary | ICD-10-CM

## 2023-02-08 ENCOUNTER — Ambulatory Visit: Payer: Medicaid Other | Admitting: Infectious Disease

## 2023-02-09 ENCOUNTER — Telehealth: Payer: Self-pay | Admitting: *Deleted

## 2023-02-09 NOTE — Telephone Encounter (Signed)
Spoke with patient regarding her mammogram appointment 03/01/2023 @ 11:40am. Patient aware there will be a no show fee of $75 charged for  not rescheduling or cancelling appointment, appointment also mailed to patient.

## 2023-02-09 NOTE — Telephone Encounter (Signed)
Appt has been made for 8/22 with Dr Lyn Hollingshead

## 2023-02-14 ENCOUNTER — Telehealth: Payer: Self-pay | Admitting: Neurology

## 2023-02-14 ENCOUNTER — Encounter: Payer: Self-pay | Admitting: Neurology

## 2023-02-14 NOTE — Telephone Encounter (Signed)
LVM and sent letter in mail informing pt of appt change from 03/30/23 to 04/04/23 due to NP being out

## 2023-02-22 ENCOUNTER — Telehealth: Payer: Self-pay

## 2023-02-22 ENCOUNTER — Ambulatory Visit: Payer: Medicaid Other | Admitting: Infectious Disease

## 2023-02-22 NOTE — Telephone Encounter (Signed)
Patient called to reschedule within 24 hrs of scheduled appt due to a migraine.   Patient also requested if we could notify that she is out of refills and would like to have some sent in for her Symtuza.

## 2023-02-23 ENCOUNTER — Ambulatory Visit: Payer: Medicaid Other | Admitting: Student

## 2023-02-23 ENCOUNTER — Encounter: Payer: Self-pay | Admitting: Student

## 2023-02-23 ENCOUNTER — Other Ambulatory Visit: Payer: Self-pay

## 2023-02-23 VITALS — BP 132/68 | HR 65 | Temp 99.1°F | Ht 66.0 in | Wt 306.0 lb

## 2023-02-23 DIAGNOSIS — R300 Dysuria: Secondary | ICD-10-CM

## 2023-02-23 DIAGNOSIS — M79605 Pain in left leg: Secondary | ICD-10-CM | POA: Diagnosis not present

## 2023-02-23 DIAGNOSIS — R251 Tremor, unspecified: Secondary | ICD-10-CM

## 2023-02-23 DIAGNOSIS — M25512 Pain in left shoulder: Secondary | ICD-10-CM | POA: Diagnosis not present

## 2023-02-23 DIAGNOSIS — E785 Hyperlipidemia, unspecified: Secondary | ICD-10-CM

## 2023-02-23 DIAGNOSIS — Z7985 Long-term (current) use of injectable non-insulin antidiabetic drugs: Secondary | ICD-10-CM | POA: Diagnosis not present

## 2023-02-23 DIAGNOSIS — R29818 Other symptoms and signs involving the nervous system: Secondary | ICD-10-CM

## 2023-02-23 DIAGNOSIS — E119 Type 2 diabetes mellitus without complications: Secondary | ICD-10-CM

## 2023-02-23 DIAGNOSIS — G4700 Insomnia, unspecified: Secondary | ICD-10-CM

## 2023-02-23 DIAGNOSIS — M79604 Pain in right leg: Secondary | ICD-10-CM | POA: Diagnosis not present

## 2023-02-23 DIAGNOSIS — Z794 Long term (current) use of insulin: Secondary | ICD-10-CM

## 2023-02-23 DIAGNOSIS — K5900 Constipation, unspecified: Secondary | ICD-10-CM | POA: Diagnosis not present

## 2023-02-23 DIAGNOSIS — Z6841 Body Mass Index (BMI) 40.0 and over, adult: Secondary | ICD-10-CM

## 2023-02-23 DIAGNOSIS — B20 Human immunodeficiency virus [HIV] disease: Secondary | ICD-10-CM

## 2023-02-23 DIAGNOSIS — B3731 Acute candidiasis of vulva and vagina: Secondary | ICD-10-CM | POA: Diagnosis not present

## 2023-02-23 LAB — POCT GLYCOSYLATED HEMOGLOBIN (HGB A1C): Hemoglobin A1C: 11 % — AB (ref 4.0–5.6)

## 2023-02-23 LAB — POCT URINALYSIS DIPSTICK
Bilirubin, UA: NEGATIVE
Blood, UA: NEGATIVE
Glucose, UA: POSITIVE — AB
Ketones, UA: NEGATIVE
Leukocytes, UA: NEGATIVE
Nitrite, UA: NEGATIVE
Protein, UA: NEGATIVE
Spec Grav, UA: 1.01 (ref 1.010–1.025)
Urobilinogen, UA: 0.2 E.U./dL
pH, UA: 5.5 (ref 5.0–8.0)

## 2023-02-23 LAB — GLUCOSE, CAPILLARY: Glucose-Capillary: 255 mg/dL — ABNORMAL HIGH (ref 70–99)

## 2023-02-23 MED ORDER — ZOLPIDEM TARTRATE 10 MG PO TABS
ORAL_TABLET | ORAL | 3 refills | Status: DC
Start: 2023-02-23 — End: 2023-06-30

## 2023-02-23 MED ORDER — TERCONAZOLE 0.4 % VA CREA
1.0000 | TOPICAL_CREAM | VAGINAL | 0 refills | Status: DC | PRN
Start: 2023-02-23 — End: 2023-08-31

## 2023-02-23 MED ORDER — SYMTUZA 800-150-200-10 MG PO TABS
1.0000 | ORAL_TABLET | Freq: Every day | ORAL | 0 refills | Status: DC
Start: 2023-02-23 — End: 2023-03-29

## 2023-02-23 NOTE — Assessment & Plan Note (Addendum)
Patient has concerns of dysuria today.  She notes burning when she pees, itching, polyuria (likely secondary to her uncontrolled diabetes), white clumpy vaginal discharge.  Point-of-care urine dipstick obtained today relatively unremarkable with negative nitrites and leukocytes.  Presentation is most in line with vaginal candidiasis.  Plan: - Terazol 7 vaginal cream

## 2023-02-23 NOTE — Assessment & Plan Note (Addendum)
Longstanding history of insomnia currently on Ambien 10 mg.  Was hoping to meet with new PCP today in order to establish care and refill medications.  Patient states that she does not sleep well at nights, if she does not take Ambien she cannot sleep at all and with Ambien she sleeps about 5 hours.  Has issues with sleep onset and maintenance.  Earliest Ambien fill 03/15/2021 per PDMP. Says it hasn't decreased in effect for her.   We have tried belsomra in the past, but the cost was very high for this mediation and the patient could not afford it. She says she sometimes try and sleep without Ambien and has never used it during the day time. She denies issues with excess daytime sleepiness or impairment of work of life duties due to medication use. PDMP was reviewed and shows appropriate fill history.   She was recommended to for sleep study, but did not complete the study.  We discussed again today.  Patient not very interested in sleep study, though agreed that we will place a referral at this time and she will consider it.  She likely has OSA / obesity hypoventilation syndrome given polycythemia on prior labs and BMI near 50.   Plan: -Refill Ambien 10mg  nightly  -Encouraged patient to pursue sleep study

## 2023-02-23 NOTE — Assessment & Plan Note (Addendum)
Patient endorse left shoulder pain which has been going on for about two months. Does not remember an inciting event. She says the pain is about 5/10 at rest with limited ROM active and pain with ROM. L shoulder strength limited by pain. Equal strength of the UE with elbow flexion and extension.   C/f rotator cuff injury or frozen shoulder.   Plan: - Provided patient with at home exercises - Currently taking ibuprofen and Tylenol for pain. -CMP in setting of Tylenol and ibuprofen use to assess liver and kidney function.

## 2023-02-23 NOTE — Assessment & Plan Note (Signed)
Follows with GI for constipation in past. Will reestablish care and likely need EGD and Colonoscopy from GI per patient. Discussed Miralax. Patient states they have tried this as well as many other OTC constipation meds in the past with little help. Plans to get reestablished with GI

## 2023-02-23 NOTE — Assessment & Plan Note (Signed)
BMI 49.39, with complications including type 2 diabetes, hypertension.  Plan - CMP to assess liver function in setting of morbid obesity.

## 2023-02-23 NOTE — Assessment & Plan Note (Signed)
Patient endorses a longstanding history of tremors, convulsions.  She follows with neurology for this, and will see them in September.  Prior seizure workup negative.  Per patient neurology plans to pursue MRI brain  Plan: - Patient has appointment neurology, appreciate recommendations.

## 2023-02-23 NOTE — Assessment & Plan Note (Signed)
Patient has history of bilateral leg pain.  She says it feels like pins-and-needles and numbness in the legs.  Likely due to diabetic neuropathy.  She has some concern for PAD today and wants to get checked for this.  Good peripheral pulses for me on exam today, ankle-brachial index obtained at this visit and 1.36 bilaterally.  Very little concern for PAD, presentation consistent with diabetic neuropathy.  Has tried gabapentin in the past, but had a reaction to this.  Plan: - Follow-up with endocrinology for management of complex diabetes.

## 2023-02-23 NOTE — Assessment & Plan Note (Addendum)
Point-of-care A1c obtained today at 11%.  She has had a very difficult time controlling her postprandial glucose, and is set to follow-up with endocrinology in order to try to optimize her medication regiment.  Plan: - Patient has endocrine follow-up set up soon -appreciate recommendations - Urine microalbumin to creatinine ratio today in preparation for endocrine visit. -CMP for kidney function.

## 2023-02-23 NOTE — Assessment & Plan Note (Signed)
>>  ASSESSMENT AND PLAN FOR TYPE 2 DIABETES MELLITUS (HCC) WRITTEN ON 02/23/2023  6:29 PM BY Lovie Macadamia, MD  Point-of-care A1c obtained today at 11%.  She has had a very difficult time controlling her postprandial glucose, and is set to follow-up with endocrinology in order to try to optimize her medication regiment.  Plan: - Patient has endocrine follow-up set up soon -appreciate recommendations - Urine microalbumin to creatinine ratio today in preparation for endocrine visit. -CMP for kidney function.

## 2023-02-23 NOTE — Progress Notes (Addendum)
Subjective:  CC: Insomnia, left shoulder pain, foot pain, diabetes  HPI:  [name] is a 57 y.o. female with a past medical history stated below and presents today for the chief complaints detailed above. Please see problem based assessment and plan for additional details.  Past Medical History:  Diagnosis Date   Acute gastritis without mention of hemorrhage 01/01/2002   Anemia 10/2009   macrocytic anemia with baseline MCV 104-106   Anxiety    Asthma    Boil 01/05/2017   Cellulitis 05/20/2020   Chondromalacia of right knee 04/20/2012   Complex tear of medial meniscus of left knee as current injury 02/25/2016   Complication of anesthesia    hard to wake up   Constipation 11/17/2009   COPD (chronic obstructive pulmonary disease) (HCC)    chronic bronchitis   Derangement of anterior horn of lateral meniscus of left knee 02/25/2016   Duodenitis without mention of hemorrhage 01/01/2002   DVT (deep venous thrombosis) (HCC)    history clot lt groin when she had lymphoma-in remision now.   Elevated triglycerides with high cholesterol 01/05/2017   Erythema 09/01/2021   Gastroparesis    GERD (gastroesophageal reflux disease)    Hiatal hernia 01/01/2002   HIV infection (HCC)    CD4 = 570 (05/2010), VL undetectation   Hyperlipidemia    IBS (irritable bowel syndrome)    Migraines    on topamax and triptans, frequent (almost daily) attacks    MRSA (methicillin resistant staph aureus) culture positive 12/20/2010   Non Hodgkin's lymphoma (HCC)    stage II, s/p resection, chemotherapy, radiotherapy, Dr. Cyndie Chime is her oncologist.    PUD (peptic ulcer disease) 07/1997   per EGD report 07/1997 with history of esophagitis   Restless leg syndrome 12/24/2015   Shingles    in lumbar dermatome   Spondylolisthesis of lumbar region    L5-S1   TMJ (dislocation of temporomandibular joint)    Traumatic tear of lateral meniscus of right knee 04/20/2012   Umbilical bleeding  09/01/2021   Vaginal yeast infection 09/25/2017    Current Outpatient Medications on File Prior to Visit  Medication Sig Dispense Refill   albuterol (PROAIR HFA) 108 (90 Base) MCG/ACT inhaler USE 1 TO 2 PUFFS EVERY 4-6 HOURS AS NEEDED FOR SHORTNESS OF BREATH ORWHEEZING 8.5 g 1   azelastine (ASTELIN) 0.1 % nasal spray PLACE 2 SPRAYS INTO BOTH NOSTRILS 2 TIMES DAILY USE IN EACH NOSTRIL AS DIRECTED 30 mL 12   Beclomethasone Dipropionate 80 MCG/ACT AERS Place 2 Inhalers into the nose daily. 8.7 g 10   blood glucose meter kit and supplies Dispense based on patient and insurance preference. Use up to four times daily as directed. (FOR ICD-9 250.00, 250.01). 1 each 0   botulinum toxin Type A (BOTOX) 100 units SOLR injection INJECT UP TO 200 UNITS  INTRAMUSCULARLY EVERY 90 DAYS 2 each 3   Calcium Carbonate-Vitamin D (CALCIUM-VITAMIN D) 500-200 MG-UNIT per tablet Take 2 tablets by mouth daily.      Continuous Blood Gluc Receiver (DEXCOM G7 RECEIVER) DEVI 1 each by Does not apply route daily. 1 each 0   Continuous Blood Gluc Sensor (DEXCOM G7 SENSOR) MISC 1 each by Does not apply route every 14 (fourteen) days. 9 each 3   Cyanocobalamin (GNP VITAMIN B-12) 1000 MCG TBCR TAKE 1 TABLET BY MOUTH EVERY DAY 90 tablet 0   diclofenac Sodium (VOLTAREN) 1 % GEL APPLY TOPICALLY TO AFFECTED AREA 4 GRAMS 4 TIMES DAILY 100  g 2   FARXIGA 10 MG TABS tablet TAKE 1 TABLET BY MOUTH EVERY DAY 90 tablet 3   fluconazole (DIFLUCAN) 100 MG tablet Take 1 tablet (100 mg total) by mouth daily. 14 tablet 3   FLUoxetine (PROZAC) 20 MG capsule Take 1 capsule (20 mg total) by mouth daily. 90 capsule 2   Fremanezumab-vfrm (AJOVY) 225 MG/1.5ML SOAJ Inject 225 mg into the skin every 30 (thirty) days. 1.68 mL 11   Fremanezumab-vfrm (AJOVY) 225 MG/1.5ML SOAJ Inject 225 mg into the skin every 30 (thirty) days. 1.68 mL 0   glucose blood test strip Use as instructed 100 each 12   Insulin Glargine (BASAGLAR KWIKPEN) 100 UNIT/ML SMARTSIG:30  Unit(s) SUB-Q Daily     mometasone-formoterol (DULERA) 100-5 MCG/ACT AERO USE 2 PUFFS BY MOUTH THE FIRST THING IN THE MORNING AND THEN ANOTHER 2 PUFFS ABOUT 12 HOURS LATER 13 g 5   OZEMPIC, 0.25 OR 0.5 MG/DOSE, 2 MG/1.5ML SOPN Inject 0.5 mg into the skin once a week.     pantoprazole (PROTONIX) 40 MG tablet TAKE 1 TABLET BY MOUTH 2 TIMES A DAY FORACID REFLUX 60 tablet 4   Plecanatide (TRULANCE) 3 MG TABS Take 1 tablet by mouth daily. 30 tablet 0   propranolol (INDERAL) 20 MG tablet Take 1 tablet (20 mg total) by mouth 2 (two) times daily. 180 tablet 1   rosuvastatin (CRESTOR) 20 MG tablet TAKE 1 TABLET BY MOUTH EVERY DAY FOR CHOLESTEROL 90 tablet 3   Tamsulosin HCl (FLOMAX) 0.4 MG CAPS Take 0.4 mg by mouth daily. 1-2 daily     tiZANidine (ZANAFLEX) 4 MG tablet Take 1 tablet (4 mg total) by mouth every 8 (eight) hours as needed for muscle spasms. 90 tablet 1   zolmitriptan (ZOMIG) 5 MG tablet Take 1 tablet (5 mg total) by mouth as needed for migraine. 12 tablet 11   No current facility-administered medications on file prior to visit.    Review of Systems: ROS negative except for what is noted on the assessment and plan.  Objective:   Vitals:   02/23/23 1409  BP: 132/68  Pulse: 65  Temp: 99.1 F (37.3 C)  TempSrc: Oral  SpO2: 96%  Weight: (!) 306 lb (138.8 kg)  Height: 5\' 6"  (1.676 m)    Physical Exam: Constitutional: Lethargic appearing in no acute distress HENT: normocephalic atraumatic, mucous membranes moist Eyes: conjunctiva non-erythematous Neck: supple Cardiovascular: regular rate and rhythm, no m/r/g Pulmonary/Chest: normal work of breathing on room air, lungs clear to auscultation bilaterally Abdominal: soft, non-tender, non-distended MSK: Limited active range of motion of the left shoulder.  Limited passive range of motion of the left shoulder.  Pain with passive range of motion of the left shoulder.  Weakness of the left shoulder likely secondary to pain.  Normal and  symmetric strength of the upper extremities at the elbow bilaterally.    Assessment & Plan:  Type 2 diabetes mellitus (HCC) Point-of-care A1c obtained today at 11%.  She has had a very difficult time controlling her postprandial glucose, and is set to follow-up with endocrinology in order to try to optimize her medication regiment.  Plan: - Patient has endocrine follow-up set up soon -appreciate recommendations - Urine microalbumin to creatinine ratio today in preparation for endocrine visit. -CMP for kidney function.   Insomnia Longstanding history of insomnia currently on Ambien 10 mg.  Was hoping to meet with new PCP today in order to establish care and refill medications.  Patient states that she does  not sleep well at nights, if she does not take Ambien she cannot sleep at all and with Ambien she sleeps about 5 hours.  Has issues with sleep onset and maintenance.  Earliest Ambien fill 03/15/2021 per PDMP. Says it hasn't decreased in effect for her.   We have tried belsomra in the past, but the cost was very high for this mediation and the patient could not afford it. She says she sometimes try and sleep without Ambien and has never used it during the day time. She denies issues with excess daytime sleepiness or impairment of work of life duties due to medication use. PDMP was reviewed and shows appropriate fill history.   She was recommended to for sleep study, but did not complete the study.  We discussed again today.  Patient not very interested in sleep study, though agreed that we will place a referral at this time and she will consider it.  She likely has OSA / obesity hypoventilation syndrome given polycythemia on prior labs and BMI near 50.   Plan: -Refill Ambien 10mg  nightly  -Encouraged patient to pursue sleep study   Tremor Patient endorses a longstanding history of tremors, convulsions.  She follows with neurology for this, and will see them in September.  Prior seizure  workup negative.  Per patient neurology plans to pursue MRI brain  Plan: - Patient has appointment neurology, appreciate recommendations.   Dyslipidemia Last LDL above goal - last LDL 536 on September 30, 2021.  Plan: - Lipid Panel  Morbid (severe) obesity due to excess calories (HCC) BMI 49.39, with complications including type 2 diabetes, hypertension.  Plan - CMP to assess liver function in setting of morbid obesity.  Left shoulder pain Patient endorse left shoulder pain which has been going on for about two months. Does not remember an inciting event. She says the pain is about 5/10 at rest with limited ROM active and pain with ROM. L shoulder strength limited by pain. Equal strength of the UE with elbow flexion and extension.   C/f rotator cuff injury or frozen shoulder.   Plan: - Provided patient with at home exercises - Currently taking ibuprofen and Tylenol for pain. -CMP in setting of Tylenol and ibuprofen use to assess liver and kidney function.  Leg pain, bilateral Patient has history of bilateral leg pain.  She says it feels like pins-and-needles and numbness in the legs.  Likely due to diabetic neuropathy.  She has some concern for PAD today and wants to get checked for this.  Good peripheral pulses for me on exam today, ankle-brachial index obtained at this visit and 1.36 bilaterally.  Very little concern for PAD, presentation consistent with diabetic neuropathy.  Has tried gabapentin in the past, but had a reaction to this.  Plan: - Follow-up with endocrinology for management of complex diabetes.  Dysuria Patient has concerns of dysuria today.  She notes burning when she pees, itching, polyuria (likely secondary to her uncontrolled diabetes), white clumpy vaginal discharge.  Point-of-care urine dipstick obtained today relatively unremarkable with negative nitrites and leukocytes.  Presentation is most in line with vaginal candidiasis.  Plan: - Terazol 7 vaginal  cream  Constipation Follows with GI for constipation in past. Will reestablish care and likely need EGD and Colonoscopy from GI per patient. Discussed Miralax. Patient states they have tried this as well as many other OTC constipation meds in the past with little help. Plans to get reestablished with GI   Patient seen with Dr. Lafonda Mosses  Lovie Macadamia MD [facility]  PGY-1 Pager: [phone]  Phone: [phone] Date 02/24/2023  Time 9:15 AM

## 2023-02-23 NOTE — Telephone Encounter (Signed)
Refill sent to pharmacy.   

## 2023-02-23 NOTE — Patient Instructions (Signed)
Thank you, Ms.[name] for allowing [place] to provide your care today. Today we discussed DM, Shoulder, leg pain, and insomnia.    I have ordered the following labs for you:   Lab Orders         Glucose, capillary         Lipid Profile         Microalbumin / Creatinine Urine Ratio         CMP14 + Anion Gap         Urinalysis, Reflex Microscopic         POC Hbg A1C      Referrals ordered today:    Referral Orders         Ambulatory referral to Sleep Studies      I have ordered the following medication/changed the following medications:   Stop the following medications: Medications Discontinued During This Encounter  Medication Reason   zolpidem (AMBIEN) 10 MG tablet Reorder     Start the following medications: Meds ordered this encounter  Medications   zolpidem (AMBIEN) 10 MG tablet    Sig: TAKE 1 TABLET BY MOUTH AT BEDTIME AS NEEDED FOR SLEEP (TRY NOT TO TAKE NIGHTLY)    Dispense:  30 tablet    Refill:  3    Please fill one month after prior fill date     Follow up: 4 months With Dr. Sherrilee Gilles  We look forward to seeing you next time. Please call our clinic at [phone] if you have any questions or concerns. The best time to call is Monday-Friday from 9am-4pm, but there is someone available 24/7. If after hours or the weekend, call the main hospital number and ask for the Internal Medicine Resident On-Call. If you need medication refills, please notify your pharmacy one week in advance and they will send [place] a request.   Thank you for trusting me with your care. Wishing you the best!  Lovie Macadamia MD [facility]

## 2023-02-23 NOTE — Assessment & Plan Note (Signed)
Last LDL above goal - last LDL 664 on September 30, 2021.  Plan: - Lipid Panel

## 2023-02-24 LAB — CMP14 + ANION GAP
ALT: 18 IU/L (ref 0–32)
AST: 18 IU/L (ref 0–40)
Albumin: 4.3 g/dL (ref 3.8–4.9)
Alkaline Phosphatase: 92 IU/L (ref 44–121)
Anion Gap: 18 mmol/L (ref 10.0–18.0)
BUN/Creatinine Ratio: 25 — ABNORMAL HIGH (ref 9–23)
BUN: 18 mg/dL (ref 6–24)
Bilirubin Total: <0.2 mg/dL (ref 0.0–1.2)
CO2: 22 mmol/L (ref 20–29)
Calcium: 9.7 mg/dL (ref 8.7–10.2)
Chloride: 98 mmol/L (ref 96–106)
Creatinine, Ser: 0.71 mg/dL (ref 0.57–1.00)
Globulin, Total: 2.6 g/dL (ref 1.5–4.5)
Glucose: 252 mg/dL — ABNORMAL HIGH (ref 70–99)
Potassium: 4.2 mmol/L (ref 3.5–5.2)
Sodium: 138 mmol/L (ref 134–144)
Total Protein: 6.9 g/dL (ref 6.0–8.5)
eGFR: 100 mL/min/{1.73_m2} (ref >59)

## 2023-02-24 LAB — MICROALBUMIN / CREATININE URINE RATIO
Creatinine, Urine: 20.8 mg/dL
Microalb/Creat Ratio: <14 mg/g{creat} (ref 0–29)
Microalbumin, Urine: <3 ug/mL

## 2023-02-24 LAB — LIPID PANEL
Chol/HDL Ratio: 5.4 ratio — ABNORMAL HIGH (ref 0.0–4.4)
Cholesterol, Total: 239 mg/dL — ABNORMAL HIGH (ref 100–199)
HDL: 44 mg/dL (ref >39)
LDL Chol Calc (NIH): 100 mg/dL — ABNORMAL HIGH (ref 0–99)
Triglycerides: 561 mg/dL (ref 0–149)
VLDL Cholesterol Cal: 95 mg/dL — ABNORMAL HIGH (ref 5–40)

## 2023-02-24 NOTE — Progress Notes (Signed)
Internal Medicine Clinic Attending  I was physically present during the key portions of the resident provided service and participated in the medical decision making of patient's management care. I reviewed pertinent patient test results.  The assessment, diagnosis, and plan were formulated together and I agree with the documentation in the resident's note.  Mercie Eon, MD    Complex patient with several concerns today. Her T2DM is poorly controlled. A1C 11 today. We encouraged lifestyle modifications + current insulin regimen today and she has close follow up with Endocrine. She has symptoms of a yeast infection, which we will treat. If she has recurrent yeast infections, would recommend stopping SGLT2 inhibitor. Her main concern is shoulder pain - we recommended PT, but she does not think this would be covered by insurance, so prefers to do home stretches, which we provided her with.  I recommend repeat fasting lipids at next visit to better assess TG's and LDL

## 2023-02-27 ENCOUNTER — Telehealth: Payer: Self-pay | Admitting: *Deleted

## 2023-02-27 NOTE — Addendum Note (Signed)
Addended by: Maura Crandall on: 02/27/2023 09:05 AM   Modules accepted: Orders

## 2023-02-27 NOTE — Telephone Encounter (Signed)
Per lab - pt has an abnormal Lipid Panel. Triglycerides are 561. Ordered by Dr Rosaura Carpenter.

## 2023-03-01 ENCOUNTER — Ambulatory Visit: Payer: Medicaid Other

## 2023-03-01 ENCOUNTER — Other Ambulatory Visit: Payer: Self-pay | Admitting: Infectious Disease

## 2023-03-01 DIAGNOSIS — B20 Human immunodeficiency virus [HIV] disease: Secondary | ICD-10-CM

## 2023-03-13 ENCOUNTER — Other Ambulatory Visit: Payer: Self-pay | Admitting: Neurology

## 2023-03-16 ENCOUNTER — Other Ambulatory Visit: Payer: Self-pay | Admitting: Neurology

## 2023-03-16 ENCOUNTER — Ambulatory Visit: Payer: Medicaid Other

## 2023-03-17 ENCOUNTER — Ambulatory Visit: Payer: Medicaid Other

## 2023-03-27 ENCOUNTER — Ambulatory Visit: Payer: Medicaid Other | Admitting: Neurology

## 2023-03-29 ENCOUNTER — Telehealth: Payer: Self-pay

## 2023-03-29 ENCOUNTER — Ambulatory Visit: Payer: Medicaid Other | Admitting: Infectious Disease

## 2023-03-29 DIAGNOSIS — B20 Human immunodeficiency virus [HIV] disease: Secondary | ICD-10-CM

## 2023-03-29 MED ORDER — SYMTUZA 800-150-200-10 MG PO TABS
1.0000 | ORAL_TABLET | Freq: Every day | ORAL | 0 refills | Status: DC
Start: 2023-03-29 — End: 2023-04-26

## 2023-03-29 NOTE — Addendum Note (Signed)
Addended by: Linna Hoff D on: 03/29/2023 09:54 AM   Modules accepted: Orders

## 2023-03-29 NOTE — Telephone Encounter (Signed)
Patient called to cancel appt within 24 hours of appt time. Patient expressed that she is down to her last dose of medication (Symtuza) and requested a refill to be sent in. Let patient know I will send to clinical team about this request, but did express that the rescheduled appt will need to be kept and that it has been over a year since she has been seen and due to the cancellations.   Patient stated that she has had a death in the family and cannot do anything about that. I expressed condolences and stated that I will send over to the nurses to advise/review.

## 2023-03-29 NOTE — Telephone Encounter (Signed)
Refill sent.   Sandie Ano, RN

## 2023-03-30 ENCOUNTER — Ambulatory Visit: Payer: Medicaid Other | Admitting: Neurology

## 2023-04-04 ENCOUNTER — Ambulatory Visit: Payer: Medicaid Other | Admitting: Neurology

## 2023-04-04 ENCOUNTER — Encounter: Payer: Self-pay | Admitting: Neurology

## 2023-04-05 DIAGNOSIS — K029 Dental caries, unspecified: Secondary | ICD-10-CM | POA: Diagnosis not present

## 2023-04-05 DIAGNOSIS — Z0120 Encounter for dental examination and cleaning without abnormal findings: Secondary | ICD-10-CM | POA: Diagnosis not present

## 2023-04-07 ENCOUNTER — Ambulatory Visit
Admission: RE | Admit: 2023-04-07 | Discharge: 2023-04-07 | Disposition: A | Discharge status: Home / Self Care | Payer: Medicaid Other | Source: Ambulatory Visit | Attending: Obstetrics | Admitting: Obstetrics

## 2023-04-07 DIAGNOSIS — Z1231 Encounter for screening mammogram for malignant neoplasm of breast: Secondary | ICD-10-CM

## 2023-04-19 ENCOUNTER — Telehealth: Payer: Self-pay

## 2023-04-19 ENCOUNTER — Other Ambulatory Visit (HOSPITAL_COMMUNITY): Payer: Self-pay

## 2023-04-19 ENCOUNTER — Telehealth: Payer: Self-pay | Admitting: Neurology

## 2023-04-19 NOTE — Telephone Encounter (Signed)
Pt reports she has been told that a PA is needed for her zolmitriptan (ZOMIG) 5 MG tablet

## 2023-04-19 NOTE — Telephone Encounter (Signed)
PA request has been Submitted. New Encounter created for follow up. For additional info see Pharmacy Prior Auth telephone encounter from 04/19/2023.

## 2023-04-19 NOTE — Telephone Encounter (Signed)
Pharmacy Patient Advocate Encounter   Received notification from Physician's Office that prior authorization for ZOLMitriptan 5MG  tablets is required/requested.   Insurance verification completed.   The patient is insured through [facility] MEDICAID .   Per test claim: PA required; PA submitted to [facility] MEDICAID via CoverMyMeds Key/confirmation #/EOC [id number] Status is pending

## 2023-04-26 ENCOUNTER — Other Ambulatory Visit: Payer: Self-pay

## 2023-04-26 DIAGNOSIS — B20 Human immunodeficiency virus [HIV] disease: Secondary | ICD-10-CM

## 2023-04-26 MED ORDER — SYMTUZA 800-150-200-10 MG PO TABS: 1.0000 | ORAL_TABLET | Freq: Every day | ORAL | 0 refills | Status: DC

## 2023-04-28 ENCOUNTER — Other Ambulatory Visit (HOSPITAL_COMMUNITY): Payer: Self-pay

## 2023-04-28 NOTE — Telephone Encounter (Signed)
Pharmacy Patient Advocate Encounter  Received notification from [facility] MEDICAID that Prior Authorization for ZOLMitriptan 5MG  tablets has been APPROVED from 04/19/2023 to 04/18/2024   PA #/Case ID/Reference #: PA Case ID #: [id number]

## 2023-05-03 ENCOUNTER — Ambulatory Visit (INDEPENDENT_AMBULATORY_CARE_PROVIDER_SITE_OTHER): Payer: Medicaid Other | Admitting: Infectious Disease

## 2023-05-03 ENCOUNTER — Other Ambulatory Visit (HOSPITAL_COMMUNITY)
Admission: RE | Admit: 2023-05-03 | Discharge: 2023-05-03 | Disposition: A | Discharge status: Home / Self Care | Payer: Medicaid Other | Source: Ambulatory Visit | Attending: Infectious Disease | Admitting: Infectious Disease

## 2023-05-03 ENCOUNTER — Other Ambulatory Visit: Payer: Self-pay

## 2023-05-03 ENCOUNTER — Encounter: Payer: Self-pay | Admitting: Infectious Disease

## 2023-05-03 VITALS — BP 122/79 | HR 66 | Temp 97.7°F | Ht 66.0 in | Wt 301.0 lb

## 2023-05-03 DIAGNOSIS — L659 Nonscarring hair loss, unspecified: Secondary | ICD-10-CM

## 2023-05-03 DIAGNOSIS — Z7185 Encounter for immunization safety counseling: Secondary | ICD-10-CM | POA: Insufficient documentation

## 2023-05-03 DIAGNOSIS — E083542 Diabetes mellitus due to underlying condition with proliferative diabetic retinopathy with combined traction retinal detachment and rhegmatogenous retinal detachment, left eye: Secondary | ICD-10-CM | POA: Diagnosis not present

## 2023-05-03 DIAGNOSIS — I10 Essential (primary) hypertension: Secondary | ICD-10-CM

## 2023-05-03 DIAGNOSIS — B20 Human immunodeficiency virus [HIV] disease: Secondary | ICD-10-CM | POA: Insufficient documentation

## 2023-05-03 DIAGNOSIS — E785 Hyperlipidemia, unspecified: Secondary | ICD-10-CM | POA: Diagnosis not present

## 2023-05-03 DIAGNOSIS — Z23 Encounter for immunization: Secondary | ICD-10-CM | POA: Diagnosis not present

## 2023-05-03 DIAGNOSIS — Z794 Long term (current) use of insulin: Secondary | ICD-10-CM | POA: Insufficient documentation

## 2023-05-03 DIAGNOSIS — Z7984 Long term (current) use of oral hypoglycemic drugs: Secondary | ICD-10-CM | POA: Diagnosis not present

## 2023-05-03 DIAGNOSIS — E119 Type 2 diabetes mellitus without complications: Secondary | ICD-10-CM | POA: Diagnosis not present

## 2023-05-03 DIAGNOSIS — R251 Tremor, unspecified: Secondary | ICD-10-CM

## 2023-05-03 MED ORDER — SYMTUZA 800-150-200-10 MG PO TABS: 1.0000 | ORAL_TABLET | Freq: Every day | ORAL | 11 refills | Status: DC

## 2023-05-03 NOTE — Progress Notes (Signed)
Subjective:  Chief complaint: followup for HIV disease on medications, with worsening tremor   Patient ID: [name], female    DOB: 1965/11/24, 57 y.o.   MRN: [id number]  HPI  Discussed the use of AI scribe software for clinical note transcription with the patient, who gave verbal consent to proceed.  History of Present Illness   The patient, with a history of multiple surgeries, diabetes, migraines, tremors, and HIV, presents with worsening tremors and difficulty controlling diabetes. The patient's diabetes has been challenging to manage, with previous medications causing severe vomiting. The patient is currently on a regimen of long-acting insulin, short-acting insulin three times a day, and [place]. The patient also reports experiencing tremors, which have increased in frequency from once or twice a year to monthly. The patient has been experiencing difficulty eating due to the tremors. The patient's HIV is well-controlled with Symtuza, and the patient reports no adverse effects from this medication. The patient also mentions concerns about vaccinations, specifically for flu, COVID-19, RSV, and shingles.       Past Medical History:  Diagnosis Date   Acute gastritis without mention of hemorrhage 01/01/2002   Anemia 10/2009   macrocytic anemia with baseline MCV 104-106   Anxiety    Asthma    Boil 01/05/2017   Cellulitis 05/20/2020   Chondromalacia of right knee 04/20/2012   Complex tear of medial meniscus of left knee as current injury 02/25/2016   Complication of anesthesia    hard to wake up   Constipation 11/17/2009   COPD (chronic obstructive pulmonary disease) (HCC)    chronic bronchitis   Derangement of anterior horn of lateral meniscus of left knee 02/25/2016   Duodenitis without mention of hemorrhage 01/01/2002   DVT (deep venous thrombosis) (HCC)    history clot lt groin when she had lymphoma-in remision now.   Elevated triglycerides with high cholesterol 01/05/2017    Erythema 09/01/2021   Gastroparesis    GERD (gastroesophageal reflux disease)    Hiatal hernia 01/01/2002   HIV infection (HCC)    CD4 = 570 (05/2010), VL undetectation   Hyperlipidemia    IBS (irritable bowel syndrome)    Migraines    on topamax and triptans, frequent (almost daily) attacks    MRSA (methicillin resistant staph aureus) culture positive 12/20/2010   Non Hodgkin's lymphoma (HCC)    stage II, s/p resection, chemotherapy, radiotherapy, Dr. Cyndie Chime is her oncologist.    PUD (peptic ulcer disease) 07/1997   per EGD report 07/1997 with history of esophagitis   Restless leg syndrome 12/24/2015   Shingles    in lumbar dermatome   Spondylolisthesis of lumbar region    L5-S1   TMJ (dislocation of temporomandibular joint)    Traumatic tear of lateral meniscus of right knee 04/20/2012   Umbilical bleeding 09/01/2021   Vaginal yeast infection 09/25/2017    Past Surgical History:  Procedure Laterality Date   CHOLECYSTECTOMY     ENDOMETRIAL ABLATION     KNEE ARTHROSCOPY Right 04/20/12   KNEE ARTHROSCOPY WITH LATERAL MENISECTOMY Left 02/25/2016   Procedure: LEFT KNEE ARTHROSCOPY WITH MEDIAL AND  LATERAL MENISECTOMIES;  Surgeon: Teryl Lucy, MD;  Location: [facility];  Service: Orthopedics;  Laterality: Left;   KNEE ARTHROSCOPY WITH MEDIAL MENISECTOMY Left 02/25/2016   Procedure: KNEE ARTHROSCOPY WITH MEDIAL MENISECTOMY;  Surgeon: Teryl Lucy, MD;  Location: [facility];  Service: Orthopedics;  Laterality: Left;   TONSILLECTOMY     TYMPANOPLASTY Left  TYMPANOSTOMY TUBE PLACEMENT     as child-both   UPPER GASTROINTESTINAL ENDOSCOPY  2003, 2007   done by Dr. Corinda Gubler    Family History  Problem Relation Age of Onset   Hypertension Mother    Stroke Mother    Heart disease Mother    Diabetes Mother    Colon polyps Mother    Breast cancer Paternal Grandmother    Kidney cancer Maternal Uncle    Heart disease Maternal Uncle     Diabetes Maternal Uncle    Diabetes Maternal Grandmother    Heart disease Maternal Grandmother    Emphysema Paternal Uncle        never smoker   Emphysema Paternal Grandfather        never smoker   Colon cancer Paternal Aunt        with met to lung    Non-Hodgkin's lymphoma Maternal Uncle    Heart disease Maternal Grandfather       Social History   Socioeconomic History   Marital status: Divorced    Spouse name: Not on file   Number of children: 0   Years of education: 12   Highest education level: Not on file  Occupational History   Occupation: disabled    Associate Professor: UNEMPLOYED  Tobacco Use   Smoking status: Never   Smokeless tobacco: Never  Vaping Use   Vaping status: Never Used  Substance and Sexual Activity   Alcohol use: No    Alcohol/week: 0.0 standard drinks of alcohol   Drug use: No   Sexual activity: Not Currently    Partners: Male    Birth control/protection: Surgical  Other Topics Concern   Not on file  Social History Narrative      Patient is disabled.   Right handed.   Caffeine - Diet soda., tea   Education - high school   Social Determinants of Health   Financial Resource Strain: Not on file  Food Insecurity: Low Risk  (01/10/2023)   [facility]   Hunger Vital Sign    Worried About Running Out of Food in the Last Year: Never true    Ran Out of Food in the Last Year: Never true  Transportation Needs: No Transportation Needs (01/10/2023)   [facility]   Transportation    In the past 12 months, has lack of reliable transportation kept you from medical appointments, meetings, work or from getting things needed for daily living? : No  Physical Activity: Not on file  Stress: Not on file  Social Connections: Not on file    Allergies  Allergen Reactions   Divalproex Sodium Nausea And Vomiting    Causes light-headedness   Gabapentin Nausea And Vomiting    confusion   Ondansetron Hcl  Nausea And Vomiting    Triggers migraines, "makes me vomit"   Pineapple    Propoxyphene N-Acetaminophen Nausea And Vomiting   Simvastatin     PATIENT CANNOT BE PRESCRIBED THIS STATIN WHILE RECEIVING NORVIR   Zocor [Simvastatin - High Dose]    Penicillins     REACTION: lips swollen to point of bleeding, sever vaginal irritation  Reports tolerating augmentin well without any complaints or reaction     Current Outpatient Medications:    albuterol (PROAIR HFA) 108 (90 Base) MCG/ACT inhaler, USE 1 TO 2 PUFFS EVERY 4-6 HOURS AS NEEDED FOR SHORTNESS OF BREATH ORWHEEZING, Disp: 8.5 g, Rfl: 1   blood glucose meter kit and supplies, Dispense  based on patient and insurance preference. Use up to four times daily as directed. (FOR ICD-9 250.00, 250.01)., Disp: 1 each, Rfl: 0   botulinum toxin Type A (BOTOX) 100 units SOLR injection, INJECT UP TO 200 UNITS  INTRAMUSCULARLY EVERY 90 DAYS, Disp: 2 each, Rfl: 3   Calcium Carbonate-Vitamin D (CALCIUM-VITAMIN D) 500-200 MG-UNIT per tablet, Take 2 tablets by mouth daily. , Disp: , Rfl:    Continuous Blood Gluc Receiver (DEXCOM G7 RECEIVER) DEVI, 1 each by Does not apply route daily., Disp: 1 each, Rfl: 0   Continuous Blood Gluc Sensor (DEXCOM G7 SENSOR) MISC, 1 each by Does not apply route every 14 (fourteen) days., Disp: 9 each, Rfl: 3   Darunavir-Cobicistat-Emtricitabine-Tenofovir Alafenamide (SYMTUZA) 800-150-200-10 MG TABS, Take 1 tablet by mouth daily with breakfast., Disp: 30 tablet, Rfl: 0   diclofenac Sodium (VOLTAREN) 1 % GEL, APPLY TOPICALLY TO AFFECTED AREA 4 GRAMS 4 TIMES DAILY, Disp: 100 g, Rfl: 2   FARXIGA 10 MG TABS tablet, TAKE 1 TABLET BY MOUTH EVERY DAY, Disp: 90 tablet, Rfl: 3   fluconazole (DIFLUCAN) 100 MG tablet, Take 1 tablet (100 mg total) by mouth daily., Disp: 14 tablet, Rfl: 3   Fremanezumab-vfrm (AJOVY) 225 MG/1.5ML SOAJ, Inject 225 mg into the skin every 30 (thirty) days., Disp: 1.68 mL, Rfl: 11   Fremanezumab-vfrm (AJOVY) 225  MG/1.5ML SOAJ, Inject 225 mg into the skin every 30 (thirty) days., Disp: 1.68 mL, Rfl: 0   glucose blood test strip, Use as instructed, Disp: 100 each, Rfl: 12   GNP VITAMIN B-12 1000 MCG TBCR, TAKE 1 TABLET BY MOUTH EVERY DAY, Disp: 90 tablet, Rfl: 0   Insulin Aspart FlexPen (NOVOLOG) 100 UNIT/ML, Inject into the skin., Disp: , Rfl:    Insulin Glargine (BASAGLAR KWIKPEN) 100 UNIT/ML, SMARTSIG:30 Unit(s) SUB-Q Daily, Disp: , Rfl:    mometasone-formoterol (DULERA) 100-5 MCG/ACT AERO, USE 2 PUFFS BY MOUTH THE FIRST THING IN THE MORNING AND THEN ANOTHER 2 PUFFS ABOUT 12 HOURS LATER, Disp: 13 g, Rfl: 5   pantoprazole (PROTONIX) 40 MG tablet, TAKE 1 TABLET BY MOUTH 2 TIMES A DAY FORACID REFLUX, Disp: 60 tablet, Rfl: 4   Plecanatide (TRULANCE) 3 MG TABS, Take 1 tablet by mouth daily., Disp: 30 tablet, Rfl: 0   propranolol (INDERAL) 20 MG tablet, Take 1 tablet (20 mg total) by mouth 2 (two) times daily., Disp: 180 tablet, Rfl: 1   rosuvastatin (CRESTOR) 20 MG tablet, TAKE 1 TABLET BY MOUTH EVERY DAY FOR CHOLESTEROL, Disp: 90 tablet, Rfl: 3   Tamsulosin HCl (FLOMAX) 0.4 MG CAPS, Take 0.4 mg by mouth daily. 1-2 daily, Disp: , Rfl:    terconazole (TERAZOL 7) 0.4 % vaginal cream, Place 1 applicator vaginally as needed., Disp: 45 g, Rfl: 0   tiZANidine (ZANAFLEX) 4 MG tablet, TAKE 1 TABLET BY MOUTH EVERY 8 HOURS AS NEEDED FOR MUSCLE SPASMS, Disp: 90 tablet, Rfl: 1   zolmitriptan (ZOMIG) 5 MG tablet, Take 1 tablet (5 mg total) by mouth as needed for migraine., Disp: 12 tablet, Rfl: 11   zolpidem (AMBIEN) 10 MG tablet, TAKE 1 TABLET BY MOUTH AT BEDTIME AS NEEDED FOR SLEEP (TRY NOT TO TAKE NIGHTLY), Disp: 30 tablet, Rfl: 3   azelastine (ASTELIN) 0.1 % nasal spray, PLACE 2 SPRAYS INTO BOTH NOSTRILS 2 TIMES DAILY USE IN EACH NOSTRIL AS DIRECTED (Patient not taking: Reported on 05/03/2023), Disp: 30 mL, Rfl: 12   Beclomethasone Dipropionate 80 MCG/ACT AERS, Place 2 Inhalers into the nose daily. (Patient not  taking:  Reported on 05/03/2023), Disp: 8.7 g, Rfl: 10   FLUoxetine (PROZAC) 20 MG capsule, Take 1 capsule (20 mg total) by mouth daily. (Patient not taking: Reported on 05/03/2023), Disp: 90 capsule, Rfl: 2   OZEMPIC, 0.25 OR 0.5 MG/DOSE, 2 MG/1.5ML SOPN, Inject 0.5 mg into the skin once a week. (Patient not taking: Reported on 05/03/2023), Disp: , Rfl:    Review of Systems  Constitutional:  Negative for activity change, appetite change, chills, diaphoresis, fatigue, fever and unexpected weight change.  HENT:  Negative for congestion, rhinorrhea, sinus pressure, sneezing, sore throat and trouble swallowing.   Eyes:  Negative for photophobia and visual disturbance.  Respiratory:  Negative for cough, chest tightness, shortness of breath, wheezing and stridor.   Cardiovascular:  Negative for chest pain, palpitations and leg swelling.  Gastrointestinal:  Negative for abdominal distention, abdominal pain, anal bleeding, blood in stool, constipation, diarrhea, nausea and vomiting.  Genitourinary:  Negative for difficulty urinating, dysuria, flank pain and hematuria.  Musculoskeletal:  Negative for arthralgias, back pain, gait problem, joint swelling and myalgias.  Skin:  Negative for color change, pallor, rash and wound.  Neurological:  Negative for dizziness, tremors, weakness and light-headedness.  Hematological:  Negative for adenopathy. Does not bruise/bleed easily.  Psychiatric/Behavioral:  Negative for agitation, behavioral problems, confusion, decreased concentration, dysphoric mood and sleep disturbance.        Objective:   Physical Exam Constitutional:      General: She is not in acute distress.    Appearance: Normal appearance. She is well-developed. She is not ill-appearing or diaphoretic.  HENT:     Head: Normocephalic and atraumatic.     Right Ear: Hearing and external ear normal.     Left Ear: Hearing and external ear normal.     Nose: No nasal deformity or rhinorrhea.  Eyes:      General: No scleral icterus.    Conjunctiva/sclera: Conjunctivae normal.     Right eye: Right conjunctiva is not injected.     Left eye: Left conjunctiva is not injected.     Pupils: Pupils are equal, round, and reactive to light.  Neck:     Vascular: No JVD.  Cardiovascular:     Rate and Rhythm: Normal rate and regular rhythm.     Heart sounds: S1 normal and S2 normal.  Pulmonary:     Effort: Pulmonary effort is normal. No respiratory distress.     Breath sounds: No wheezing.  Abdominal:     General: There is no distension.     Palpations: Abdomen is soft.  Musculoskeletal:        General: Normal range of motion.     Right shoulder: Normal.     Left shoulder: Normal.     Cervical back: Normal range of motion and neck supple.     Right hip: Normal.     Left hip: Normal.     Right knee: Normal.     Left knee: Normal.  Lymphadenopathy:     Head:     Right side of head: No submandibular, preauricular or posterior auricular adenopathy.     Left side of head: No submandibular, preauricular or posterior auricular adenopathy.     Cervical: No cervical adenopathy.     Right cervical: No superficial or deep cervical adenopathy.    Left cervical: No superficial or deep cervical adenopathy.  Skin:    General: Skin is warm and dry.     Coloration: Skin is not pale.  Findings: No abrasion, bruising, ecchymosis, erythema, lesion or rash.     Nails: There is no clubbing.  Neurological:     General: No focal deficit present.     Mental Status: She is alert and oriented to person, place, and time.     Sensory: No sensory deficit.     Coordination: Coordination normal.     Gait: Gait normal.  Psychiatric:        Attention and Perception: She is attentive.        Mood and Affect: Mood normal.        Speech: Speech normal.        Behavior: Behavior normal. Behavior is cooperative.        Thought Content: Thought content normal.        Judgment: Judgment normal.            Assessment & Plan:   Assessment and Plan    Type 2 Diabetes Mellitus Poorly controlled with history of high A1c. Intolerance to Ozempic. Currently on long acting insulin, short acting insulin three times a day, and Farxiga. -Continue current regimen. -Monitor blood glucose with Dexcom.  HIV Well controlled on Symtuza. Last viral load less than 20. -Continue Symtuza. -Check viral load and CD4 count today.  Tremors Increasing frequency and severity. Seeing a specialist in December. -No change in management discussed.   HTN: on inderal   Migraines: also on inderal for this    Hyperlipidemia: continue crestor and check lipids today  General Health Maintenance -Administer influenza, COVID, and tetanus vaccines today. -Recommend patient to get RSV and shingles vaccines at a pharmacy. -Schedule Pap smear with Rexene Alberts, NP. -Check HIV labs today.Marland Kitchen

## 2023-05-03 NOTE — Patient Instructions (Signed)
Make an appt with Rexene Alberts for pap smear and pelvic exam

## 2023-05-03 NOTE — Addendum Note (Signed)
Addended by: Philippa Chester on: 05/03/2023 03:28 PM   Modules accepted: Orders

## 2023-05-04 ENCOUNTER — Telehealth: Payer: Self-pay

## 2023-05-04 LAB — URINE CYTOLOGY ANCILLARY ONLY
Chlamydia: NEGATIVE
Comment: NEGATIVE
Comment: NORMAL
Neisseria Gonorrhea: NEGATIVE

## 2023-05-04 NOTE — Telephone Encounter (Signed)
Left voicemail asking patient to return my call. See result note - subject line.    [name] P Aunesty Tyson, CMA     BG over 400 she needs to make sure she is checking her actual BG regualrly and followup with IM

## 2023-05-05 LAB — T-HELPER CELLS (CD4) COUNT (NOT AT ARMC)
CD4 % Helper T Cell: 42 % (ref 33–65)
CD4 T Cell Abs: 991 /uL (ref 400–1790)

## 2023-05-06 LAB — COMPLETE METABOLIC PANEL WITH GFR
AG Ratio: 1.5 (calc) (ref 1.0–2.5)
ALT: 15 U/L (ref 6–29)
AST: 16 U/L (ref 10–35)
Albumin: 4.8 g/dL (ref 3.6–5.1)
Alkaline phosphatase (APISO): 93 U/L (ref 37–153)
BUN: 17 mg/dL (ref 7–25)
CO2: 24 mmol/L (ref 20–32)
Calcium: 9.5 mg/dL (ref 8.6–10.4)
Chloride: 95 mmol/L — ABNORMAL LOW (ref 98–110)
Creat: 0.97 mg/dL (ref 0.50–1.03)
Globulin: 3.3 g/dL (ref 1.9–3.7)
Glucose, Bld: 413 mg/dL — ABNORMAL HIGH (ref 65–99)
Potassium: 4.3 mmol/L (ref 3.5–5.3)
Sodium: 133 mmol/L — ABNORMAL LOW (ref 135–146)
Total Bilirubin: 0.4 mg/dL (ref 0.2–1.2)
Total Protein: 8.1 g/dL (ref 6.1–8.1)
eGFR: 69 mL/min/{1.73_m2} (ref >=60)

## 2023-05-06 LAB — CBC WITH DIFFERENTIAL/PLATELET
Absolute Lymphocytes: 2541 {cells}/uL (ref 850–3900)
Absolute Monocytes: 378 {cells}/uL (ref 200–950)
Basophils Absolute: 28 {cells}/uL (ref 0–200)
Basophils Relative: 0.4 %
Eosinophils Absolute: 182 {cells}/uL (ref 15–500)
Eosinophils Relative: 2.6 %
HCT: 52.3 % — ABNORMAL HIGH (ref 35.0–45.0)
Hemoglobin: 16.4 g/dL — ABNORMAL HIGH (ref 11.7–15.5)
MCH: 26.8 pg — ABNORMAL LOW (ref 27.0–33.0)
MCHC: 31.4 g/dL — ABNORMAL LOW (ref 32.0–36.0)
MCV: 85.6 fL (ref 80.0–100.0)
MPV: 8.8 fL (ref 7.5–12.5)
Monocytes Relative: 5.4 %
Neutro Abs: 3871 {cells}/uL (ref 1500–7800)
Neutrophils Relative %: 55.3 %
Platelets: 216 10*3/uL (ref 140–400)
RBC: 6.11 10*6/uL — ABNORMAL HIGH (ref 3.80–5.10)
RDW: 14.6 % (ref 11.0–15.0)
Total Lymphocyte: 36.3 %
WBC: 7 10*3/uL (ref 3.8–10.8)

## 2023-05-06 LAB — HIV-1 RNA QUANT-NO REFLEX-BLD
HIV 1 RNA Quant: NOT DETECTED {copies}/mL
HIV-1 RNA Quant, Log: NOT DETECTED {Log_copies}/mL

## 2023-05-06 LAB — LIPID PANEL
Cholesterol: 184 mg/dL (ref <200)
HDL: 36 mg/dL — ABNORMAL LOW (ref >=50)
Non-HDL Cholesterol (Calc): 148 mg/dL — ABNORMAL HIGH (ref <130)
Total CHOL/HDL Ratio: 5.1 (calc) — ABNORMAL HIGH (ref <5.0)
Triglycerides: 819 mg/dL — ABNORMAL HIGH (ref <150)

## 2023-05-06 LAB — RPR: RPR Ser Ql: NONREACTIVE

## 2023-05-08 NOTE — Telephone Encounter (Signed)
Spoke with Sarabi, relayed that blood sugar was over 400 at her last visit. Reports she has a continuous blood glucose monitor and her reading today was about 300.   Discussed the need for better management, she says she has an appointment this month with her endocrinologist and they have been adjusting her medications.   Scheduled her for pap appointment with Judeth Cornfield.   She is asking about other lab results. Relayed urine test for gonorrhea and chlamydia was negative, syphilis test was negative, viral load undetectable and triglycerides elevated. Patient verbalized understanding and has no further questions.   Sandie Ano, RN

## 2023-05-12 ENCOUNTER — Other Ambulatory Visit: Payer: Self-pay | Admitting: Neurology

## 2023-05-17 ENCOUNTER — Ambulatory Visit: Payer: Medicaid Other | Admitting: Neurology

## 2023-05-17 VITALS — BP 153/93

## 2023-05-17 DIAGNOSIS — G43711 Chronic migraine without aura, intractable, with status migrainosus: Secondary | ICD-10-CM

## 2023-05-17 DIAGNOSIS — G43719 Chronic migraine without aura, intractable, without status migrainosus: Secondary | ICD-10-CM

## 2023-05-17 MED ORDER — ONABOTULINUMTOXINA 200 UNITS IJ SOLR
155.0000 [IU] | Freq: Once | INTRAMUSCULAR | Status: AC
  Administered 2023-05-17: 155 [IU] via INTRAMUSCULAR

## 2023-05-17 NOTE — Progress Notes (Signed)
Botox- 100 units x 2 vials Lot: [id number] Expiration: 08.2026 NDC: 4540-9811-91  Bacteriostatic 0.9% Sodium Chloride- 4 mL  Lot: [id number] Expiration: 04.01.2025 NDC: 6213086578  Dx: G43.711 S/P Witnessed by April, RN

## 2023-05-17 NOTE — Progress Notes (Signed)
    BOTOX PROCEDURE NOTE FOR MIGRAINE HEADACHE  HISTORY: [name] is here for Botox. Last was 01/04/23 with me. Has been taking Ajovy. Is overdue for Botox due to missing her appointment due to car breaking down. More migraines. Takes tizanidine 4 mg up to 3 times daily. Uses Zomig, takes all 12 tablets a month. Needs refill of propranolol, helps with heart pounding. Sees Dr. Terrace Arabia on 06/13/23.   Description of procedure:  The patient was placed in a sitting position. The standard protocol was used for Botox as follows, with 5 units of Botox injected at each site:   -Procerus muscle, midline injection  -Corrugator muscle, bilateral injection  -Frontalis muscle, bilateral injection, with 2 sites each side, medial injection was performed in the upper one third of the frontalis muscle, in the region vertical from the medial inferior edge of the superior orbital rim. The lateral injection was again in the upper one third of the forehead vertically above the lateral limbus of the cornea, 1.5 cm lateral to the medial injection site.  -Temporalis muscle injection, 4 sites, bilaterally. The first injection was 3 cm above the tragus of the ear, second injection site was 1.5 cm to 3 cm up from the first injection site in line with the tragus of the ear. The third injection site was 1.5-3 cm forward between the first 2 injection sites. The fourth injection site was 1.5 cm posterior to the second injection site.  -Occipitalis muscle injection, 3 sites, bilaterally. The first injection was done one half way between the occipital protuberance and the tip of the mastoid process behind the ear. The second injection site was done lateral and superior to the first, 1 fingerbreadth from the first injection. The third injection site was 1 fingerbreadth superiorly and medially from the first injection site.  -Cervical paraspinal muscle injection, 2 sites, bilateral, the first injection site was 1 cm from the midline of the  cervical spine, 3 cm inferior to the lower border of the occipital protuberance. The second injection site was 1.5 cm superiorly and laterally to the first injection site.  -Trapezius muscle injection was performed at 3 sites, bilaterally. The first injection site was in the upper trapezius muscle halfway between the inflection point of the neck, and the acromion. The second injection site was one half way between the acromion and the first injection site. The third injection was done between the first injection site and the inflection point of the neck.   A 200 unit bottle of Botox was used, 155 units were injected, the rest of the Botox was wasted. The patient tolerated the procedure well, there were no complications of the above procedure.  Botox NDC 0347-4259-56 Lot number [id number] Expiration date 02/2025 SP  Her tizanidine and propranolol were refilled earlier this week.  She feels like Ajovy is efficacious.  We will continue current medications.  She is seeing Dr. Terrace Arabia next month.

## 2023-05-22 DIAGNOSIS — K029 Dental caries, unspecified: Secondary | ICD-10-CM | POA: Diagnosis not present

## 2023-05-25 DIAGNOSIS — E1165 Type 2 diabetes mellitus with hyperglycemia: Secondary | ICD-10-CM | POA: Diagnosis not present

## 2023-05-25 DIAGNOSIS — Z7984 Long term (current) use of oral hypoglycemic drugs: Secondary | ICD-10-CM | POA: Diagnosis not present

## 2023-05-25 DIAGNOSIS — I10 Essential (primary) hypertension: Secondary | ICD-10-CM | POA: Diagnosis not present

## 2023-05-25 DIAGNOSIS — Z978 Presence of other specified devices: Secondary | ICD-10-CM | POA: Diagnosis not present

## 2023-05-25 DIAGNOSIS — Z794 Long term (current) use of insulin: Secondary | ICD-10-CM | POA: Diagnosis not present

## 2023-05-25 DIAGNOSIS — E785 Hyperlipidemia, unspecified: Secondary | ICD-10-CM | POA: Diagnosis not present

## 2023-05-25 DIAGNOSIS — E669 Obesity, unspecified: Secondary | ICD-10-CM | POA: Diagnosis not present

## 2023-06-06 DIAGNOSIS — K029 Dental caries, unspecified: Secondary | ICD-10-CM | POA: Diagnosis not present

## 2023-06-06 DIAGNOSIS — K047 Periapical abscess without sinus: Secondary | ICD-10-CM | POA: Diagnosis not present

## 2023-06-12 ENCOUNTER — Other Ambulatory Visit: Payer: Self-pay | Admitting: Neurology

## 2023-06-12 ENCOUNTER — Ambulatory Visit: Payer: Medicaid Other | Admitting: Infectious Diseases

## 2023-06-13 ENCOUNTER — Telehealth: Payer: Self-pay | Admitting: Neurology

## 2023-06-13 ENCOUNTER — Encounter: Payer: Self-pay | Admitting: Neurology

## 2023-06-13 ENCOUNTER — Ambulatory Visit: Payer: Medicaid Other | Admitting: Neurology

## 2023-06-13 VITALS — BP 148/76 | HR 86 | Ht 66.0 in | Wt 308.0 lb

## 2023-06-13 DIAGNOSIS — G43711 Chronic migraine without aura, intractable, with status migrainosus: Secondary | ICD-10-CM | POA: Diagnosis not present

## 2023-06-13 DIAGNOSIS — G43719 Chronic migraine without aura, intractable, without status migrainosus: Secondary | ICD-10-CM

## 2023-06-13 DIAGNOSIS — R251 Tremor, unspecified: Secondary | ICD-10-CM | POA: Diagnosis not present

## 2023-06-13 DIAGNOSIS — E1142 Type 2 diabetes mellitus with diabetic polyneuropathy: Secondary | ICD-10-CM | POA: Diagnosis not present

## 2023-06-13 DIAGNOSIS — Z794 Long term (current) use of insulin: Secondary | ICD-10-CM

## 2023-06-13 MED ORDER — LAMOTRIGINE 100 MG PO TABS: 100.0000 mg | ORAL_TABLET | Freq: Two times a day (BID) | ORAL | 11 refills | Status: DC

## 2023-06-13 MED ORDER — QULIPTA 60 MG PO TABS: 60.0000 mg | ORAL_TABLET | Freq: Every day | ORAL | 11 refills | Status: DC

## 2023-06-13 MED ORDER — TIZANIDINE HCL 4 MG PO TABS: 4.0000 mg | ORAL_TABLET | Freq: Three times a day (TID) | ORAL | 11 refills | Status: DC | PRN

## 2023-06-13 MED ORDER — LAMOTRIGINE 25 MG PO TABS: ORAL_TABLET | ORAL | 0 refills | Status: DC

## 2023-06-13 NOTE — Telephone Encounter (Signed)
 Lmtrc 1st attempt

## 2023-06-13 NOTE — Progress Notes (Signed)
Chief Complaint  Patient presents with   Migraine    Rm12, mother present, Migraine:15/30 days, was off track on botox because the car broke down, Tremors: full body, pt says she cannot do basic activities of daily living.       ASSESSMENT AND PLAN  DEAISHA May is a 57 y.o. female   History of HIV, under good control, CD4 was within normal limit, HIV was not detectable by PCR   History of non-Hodgkin's lymphoma, completed with chemo therapy Obesity, diabetes poorly controlled, A1c was 11 in August 2021, evidence of mild diabetic peripheral neuropathy Depression anxiety, polypharmacy   Chronic migraine headaches        She complains of frequent almost daily headache, will stop CGRP injection, try Qulipta 60 mg daily, continue Botox injection every 3 months  Intermittent body tremor  In the setting of extreme stress, poorly controlled diabetes, mood disorder,  MRI of the brain with without contrast to rule out structural abnormality  EEG  Encouraged her to continue to work with her psychiatrist, hope better control of her depression anxiety will help her intermittent body tremor as well  DIAGNOSTIC DATA (LABS, IMAGING, TESTING) - I reviewed patient records, labs, notes, testing and imaging myself where available.   MEDICAL HISTORY:  [name], is a 57 year old female, accompanied by her mother to follow-up for chronic migraine, worsening tremor  History is obtained from the patient and review of electronic medical records. I personally reviewed pertinent available imaging films in PACS.   PMHx of  Chronic bronchitis DM-insulin dependent GERD HLD Obesity HIV Chronic migraine Depression anxiety Non-Hodgkin's lymphoma in 2004, chemo, radiation, slow decline in functional status over the past many years due to obesity, sedentary lifestyle, chronic migraine, polypharmacy treatment  Reported history of migraines since age 59, progressive to her daily headaches, on  polypharmacy for migraine prevention, her migraine often proceeding with visual aura, severe pounding headache, with blurry vision, light noise sensitivity, nauseous, her headache can last for days,  Trigger for her migraines are MSG, stress, weather change, sleep deprivation,  Tried and failed: Atenolol, verapamil, nortriptyline, Inderal, Depakote, Zonegran,Topamax, either not effective or could not tolerate the side effect  For abortive treatment, tried and failed, Imitrex, Maxalt, Zomig as needed seems to work somewhat  She began to receive Botox injection as migraine prevention since May 2013, every 3 months, with moderate help  MRI of the brain was normal in 2013  MRI lumbar in February 2020, chronic degenerative changes, L5-S1, L5 pars fracture, advanced the disc and moderate posterior element degeneration, no significant spinal lateral recess stenosis, but moderate to severe chronic bilateral L5 foraminal stenosis,   She was started on CGRP antagonist around 2022, along with Botox as migraine prevention, Emgality initially, now on aimovig, did help her headache,  Today she also complains about few years history of intermittent body tremor, whole body shaking can last for hours to days, without loss of consciousness, mother reported initial episode was in 2016 when she was under extreme stress, she was driving, while her father was at that hospital dying  She had sudden onset of body tremor, since then, she had intermittent episode, getting worse over the past couple years, she reported a lot of stress, her sister who usually accompanies her a doctor's appointment suffered fall, bony injury required multiple surgeries  She also complains of worsening depression anxiety, tried different medications without helping,  Poorly controlled diabetes, A1c was 11 in August 2024, bilateral foot  paresthesia, suggestive of diabetic peripheral neuropathy  Triglyceride up to 717 in 2022, some  improvement to 561  in August 2024, HIV was not detected   PHYSICAL EXAM:   Vitals:   06/13/23 1126  BP: (!) 148/76  Pulse: 86  Weight: (!) 308 lb (139.7 kg)  Height: 5\' 6"  (1.676 m)  Sitting 100/54, HR 74,  Standing up 115/71, HR 85,  Standing up xone minute: 135/78, HR 81.  Body mass index is 49.71 kg/m.  PHYSICAL EXAMNIATION:  Gen: NAD, conversant, well nourised, well groomed                     Cardiovascular: Regular rate rhythm, no peripheral edema, warm, nontender. Eyes: Conjunctivae clear without exudates or hemorrhage Neck: Supple, no carotid bruits. Pulmonary: Clear to auscultation bilaterally   NEUROLOGICAL EXAM:  MENTAL STATUS: Speech/cognition: Depressed looking obese female awake, alert, oriented to history taking and casual conversation CRANIAL NERVES: CN II: Visual fields are full to confrontation. Pupils are round equal and briskly reactive to light. CN III, IV, VI: extraocular movement are normal. No ptosis. CN V: Facial sensation is intact to light touch CN VII: Face is symmetric with normal eye closure  CN VIII: Hearing is normal to causal conversation. CN IX, X: Phonation is normal. CN XI: Head turning and shoulder shrug are intact  MOTOR: There is no pronator drift of out-stretched arms. Muscle bulk and tone are normal. Muscle strength is normal.  REFLEXES: Reflexes are 2+ and symmetric at the biceps, triceps, knees, and ankles. Plantar responses are flexor.  SENSORY: Mildly length-dependent decreased light touch pinprick  COORDINATION: There is no trunk or limb dysmetria noted.  GAIT/STANCE: Push-up to get up from seated position limited by her big body habitus, steady  REVIEW OF SYSTEMS:  Full 14 system review of systems performed and notable only for as above All other review of systems were negative.   ALLERGIES: Allergies  Allergen Reactions   Divalproex Sodium Nausea And Vomiting    Causes light-headedness   Gabapentin Nausea  And Vomiting    confusion   Ondansetron Hcl Nausea And Vomiting    Triggers migraines, "makes me vomit"   Pineapple    Propoxyphene N-Acetaminophen Nausea And Vomiting   Simvastatin     PATIENT CANNOT BE PRESCRIBED THIS STATIN WHILE RECEIVING NORVIR   Zocor [Simvastatin - High Dose]    Penicillins     REACTION: lips swollen to point of bleeding, sever vaginal irritation  Reports tolerating augmentin well without any complaints or reaction    HOME MEDICATIONS: Current Outpatient Medications  Medication Sig Dispense Refill   albuterol (PROAIR HFA) 108 (90 Base) MCG/ACT inhaler USE 1 TO 2 PUFFS EVERY 4-6 HOURS AS NEEDED FOR SHORTNESS OF BREATH ORWHEEZING 8.5 g 1   azelastine (ASTELIN) 0.1 % nasal spray PLACE 2 SPRAYS INTO BOTH NOSTRILS 2 TIMES DAILY USE IN EACH NOSTRIL AS DIRECTED 30 mL 12   Beclomethasone Dipropionate 80 MCG/ACT AERS Place 2 Inhalers into the nose daily. 8.7 g 10   blood glucose meter kit and supplies Dispense based on patient and insurance preference. Use up to four times daily as directed. (FOR ICD-9 250.00, 250.01). 1 each 0   botulinum toxin Type A (BOTOX) 100 units SOLR injection INJECT UP TO 200 UNITS  INTRAMUSCULARLY EVERY 90 DAYS 2 each 3   Calcium Carbonate-Vitamin D (CALCIUM-VITAMIN D) 500-200 MG-UNIT per tablet Take 2 tablets by mouth daily.      Continuous Blood Gluc Receiver (  DEXCOM G7 RECEIVER) DEVI 1 each by Does not apply route daily. 1 each 0   Continuous Blood Gluc Sensor (DEXCOM G7 SENSOR) MISC 1 each by Does not apply route every 14 (fourteen) days. 9 each 3   Darunavir-Cobicistat-Emtricitabine-Tenofovir Alafenamide (SYMTUZA) 800-150-200-10 MG TABS Take 1 tablet by mouth daily with breakfast. 30 tablet 11   diclofenac Sodium (VOLTAREN) 1 % GEL APPLY TOPICALLY TO AFFECTED AREA 4 GRAMS 4 TIMES DAILY 100 g 2   FARXIGA 10 MG TABS tablet TAKE 1 TABLET BY MOUTH EVERY DAY 90 tablet 3   fluconazole (DIFLUCAN) 100 MG tablet Take 1 tablet (100 mg total) by mouth  daily. 14 tablet 3   Fremanezumab-vfrm (AJOVY) 225 MG/1.5ML SOAJ Inject 225 mg into the skin every 30 (thirty) days. 1.68 mL 11   Fremanezumab-vfrm (AJOVY) 225 MG/1.5ML SOAJ Inject 225 mg into the skin every 30 (thirty) days. 1.68 mL 0   glucose blood test strip Use as instructed 100 each 12   GNP VITAMIN B-12 1000 MCG TBCR TAKE 1 TABLET BY MOUTH EVERY DAY 90 tablet 0   Insulin Aspart FlexPen (NOVOLOG) 100 UNIT/ML Inject into the skin.     Insulin Glargine (BASAGLAR KWIKPEN) 100 UNIT/ML SMARTSIG:30 Unit(s) SUB-Q Daily     mometasone-formoterol (DULERA) 100-5 MCG/ACT AERO USE 2 PUFFS BY MOUTH THE FIRST THING IN THE MORNING AND THEN ANOTHER 2 PUFFS ABOUT 12 HOURS LATER 13 g 5   OZEMPIC, 0.25 OR 0.5 MG/DOSE, 2 MG/1.5ML SOPN Inject 0.5 mg into the skin once a week.     pantoprazole (PROTONIX) 40 MG tablet TAKE 1 TABLET BY MOUTH 2 TIMES A DAY FORACID REFLUX 60 tablet 4   Plecanatide (TRULANCE) 3 MG TABS Take 1 tablet by mouth daily. 30 tablet 0   propranolol (INDERAL) 20 MG tablet TAKE 1 TABLET BY MOUTH TWICE DAILY 180 tablet 1   rosuvastatin (CRESTOR) 20 MG tablet TAKE 1 TABLET BY MOUTH EVERY DAY FOR CHOLESTEROL 90 tablet 3   Tamsulosin HCl (FLOMAX) 0.4 MG CAPS Take 0.4 mg by mouth daily. 1-2 daily     terconazole (TERAZOL 7) 0.4 % vaginal cream Place 1 applicator vaginally as needed. 45 g 0   tiZANidine (ZANAFLEX) 4 MG tablet TAKE 1 TABLET BY MOUTH EVERY 8 HOURS AS NEEDED FOR MUSCLE SPASMS 90 tablet 1   zolmitriptan (ZOMIG) 5 MG tablet Take 1 tablet (5 mg total) by mouth as needed for migraine. 12 tablet 11   zolpidem (AMBIEN) 10 MG tablet TAKE 1 TABLET BY MOUTH AT BEDTIME AS NEEDED FOR SLEEP (TRY NOT TO TAKE NIGHTLY) 30 tablet 3   FLUoxetine (PROZAC) 20 MG capsule Take 1 capsule (20 mg total) by mouth daily. (Patient not taking: Reported on 05/03/2023) 90 capsule 2   No current facility-administered medications for this visit.    PAST MEDICAL HISTORY: Past Medical History:  Diagnosis Date    Acute gastritis without mention of hemorrhage 01/01/2002   Anemia 10/2009   macrocytic anemia with baseline MCV 104-106   Anxiety    Asthma    Boil 01/05/2017   Cellulitis 05/20/2020   Chondromalacia of right knee 04/20/2012   Complex tear of medial meniscus of left knee as current injury 02/25/2016   Complication of anesthesia    hard to wake up   Constipation 11/17/2009   COPD (chronic obstructive pulmonary disease) (HCC)    chronic bronchitis   Derangement of anterior horn of lateral meniscus of left knee 02/25/2016   Duodenitis without mention of hemorrhage 01/01/2002  DVT (deep venous thrombosis) (HCC)    history clot lt groin when she had lymphoma-in remision now.   Elevated triglycerides with high cholesterol 01/05/2017   Erythema 09/01/2021   Gastroparesis    GERD (gastroesophageal reflux disease)    Hiatal hernia 01/01/2002   HIV infection (HCC)    CD4 = 570 (05/2010), VL undetectation   Hyperlipidemia    IBS (irritable bowel syndrome)    Migraines    on topamax and triptans, frequent (almost daily) attacks    MRSA (methicillin resistant staph aureus) culture positive 12/20/2010   Non Hodgkin's lymphoma (HCC)    stage II, s/p resection, chemotherapy, radiotherapy, Dr. Cyndie Chime is her oncologist.    PUD (peptic ulcer disease) 07/1997   per EGD report 07/1997 with history of esophagitis   Restless leg syndrome 12/24/2015   Shingles    in lumbar dermatome   Spondylolisthesis of lumbar region    L5-S1   TMJ (dislocation of temporomandibular joint)    Traumatic tear of lateral meniscus of right knee 04/20/2012   Umbilical bleeding 09/01/2021   Vaginal yeast infection 09/25/2017    PAST SURGICAL HISTORY: Past Surgical History:  Procedure Laterality Date   CHOLECYSTECTOMY     ENDOMETRIAL ABLATION     KNEE ARTHROSCOPY Right 04/20/12   KNEE ARTHROSCOPY WITH LATERAL MENISECTOMY Left 02/25/2016   Procedure: LEFT KNEE ARTHROSCOPY WITH MEDIAL AND  LATERAL  MENISECTOMIES;  Surgeon: Teryl Lucy, MD;  Location: [facility];  Service: Orthopedics;  Laterality: Left;   KNEE ARTHROSCOPY WITH MEDIAL MENISECTOMY Left 02/25/2016   Procedure: KNEE ARTHROSCOPY WITH MEDIAL MENISECTOMY;  Surgeon: Teryl Lucy, MD;  Location: [facility];  Service: Orthopedics;  Laterality: Left;   TONSILLECTOMY     TYMPANOPLASTY Left    TYMPANOSTOMY TUBE PLACEMENT     as child-both   UPPER GASTROINTESTINAL ENDOSCOPY  2003, 2007   done by Dr. Corinda Gubler    FAMILY HISTORY: Family History  Problem Relation Age of Onset   Hypertension Mother    Stroke Mother    Heart disease Mother    Diabetes Mother    Colon polyps Mother    Breast cancer Paternal Grandmother    Kidney cancer Maternal Uncle    Heart disease Maternal Uncle    Diabetes Maternal Uncle    Diabetes Maternal Grandmother    Heart disease Maternal Grandmother    Emphysema Paternal Uncle        never smoker   Emphysema Paternal Grandfather        never smoker   Colon cancer Paternal Aunt        with met to lung    Non-Hodgkin's lymphoma Maternal Uncle    Heart disease Maternal Grandfather     SOCIAL HISTORY: Social History   Socioeconomic History   Marital status: Divorced    Spouse name: Not on file   Number of children: 0   Years of education: 12   Highest education level: Not on file  Occupational History   Occupation: disabled    Associate Professor: UNEMPLOYED  Tobacco Use   Smoking status: Never   Smokeless tobacco: Never  Vaping Use   Vaping status: Never Used  Substance and Sexual Activity   Alcohol use: No    Alcohol/week: 0.0 standard drinks of alcohol   Drug use: No   Sexual activity: Not Currently    Partners: Male    Birth control/protection: Surgical  Other Topics Concern   Not on file  Social History Narrative  Patient is disabled.   Right handed.   Caffeine - Diet soda., tea   Education - high school   Social Determinants of Health    Financial Resource Strain: Not on file  Food Insecurity: Low Risk  (05/25/2023)   Received from [facility]   Hunger Vital Sign    Worried About Running Out of Food in the Last Year: Never true    Ran Out of Food in the Last Year: Never true  Transportation Needs: No Transportation Needs (05/25/2023)   Received from [facility]    In the past 12 months, has lack of reliable transportation kept you from medical appointments, meetings, work or from getting things needed for daily living? : No  Physical Activity: Not on file  Stress: Not on file  Social Connections: Not on file  Intimate Partner Violence: Not on file      Levert Feinstein, M.D. Ph.D.  [facility] [address] [place], [place] [address] Ph: [phone] Fax: [phone]  CC:  Belva Agee, MD No address on file  Rana Snare, DO

## 2023-06-13 NOTE — Telephone Encounter (Signed)
Please advise her avoiding daily use of Zomig, maximum 2-3 times a week as needed, not daily, to avoid medication overuse headache, which can be very difficult to treat

## 2023-06-13 NOTE — Telephone Encounter (Signed)
FYI: Pt wanted to let Dr. Terrace Arabia know take zolmitriptan (ZOMIG) 5 MG tablet 3 times a week.

## 2023-06-14 NOTE — Telephone Encounter (Signed)
Lmtrc

## 2023-06-15 NOTE — Telephone Encounter (Signed)
Patient returned call and confirmed she is only taking Zomig 2-3 times a week as need. No other questions at this time.

## 2023-06-15 NOTE — Telephone Encounter (Signed)
Lmtrc. 2nd attempt.

## 2023-06-19 ENCOUNTER — Telehealth: Payer: Self-pay | Admitting: Neurology

## 2023-06-19 NOTE — Telephone Encounter (Signed)
[employer] medicaid Berkley Harvey: [id number] exp. 06/19/23-08/03/23 sent to [facility] [phone]

## 2023-06-26 ENCOUNTER — Other Ambulatory Visit: Payer: Self-pay | Admitting: Student

## 2023-06-26 DIAGNOSIS — G4700 Insomnia, unspecified: Secondary | ICD-10-CM

## 2023-06-30 ENCOUNTER — Other Ambulatory Visit: Payer: Self-pay

## 2023-06-30 DIAGNOSIS — G4700 Insomnia, unspecified: Secondary | ICD-10-CM

## 2023-06-30 MED ORDER — ZOLPIDEM TARTRATE 10 MG PO TABS: ORAL_TABLET | ORAL | 3 refills | Status: DC

## 2023-07-06 DIAGNOSIS — Z0120 Encounter for dental examination and cleaning without abnormal findings: Secondary | ICD-10-CM | POA: Diagnosis not present

## 2023-07-07 ENCOUNTER — Telehealth: Payer: Self-pay | Admitting: Neurology

## 2023-07-07 NOTE — Telephone Encounter (Signed)
 Pt reports that a pa is needed for her Atogepant (QULIPTA) 60 MG TABS

## 2023-07-12 ENCOUNTER — Ambulatory Visit: Payer: Medicaid Other | Admitting: Neurology

## 2023-07-12 ENCOUNTER — Other Ambulatory Visit (HOSPITAL_COMMUNITY): Payer: Self-pay

## 2023-07-12 ENCOUNTER — Telehealth: Payer: Self-pay | Admitting: Pharmacy Technician

## 2023-07-12 DIAGNOSIS — K029 Dental caries, unspecified: Secondary | ICD-10-CM | POA: Diagnosis not present

## 2023-07-12 NOTE — Telephone Encounter (Signed)
 PA request has been Submitted. New Encounter created for follow up. For additional info see Pharmacy Prior Auth telephone encounter from 07-12-2023.

## 2023-07-12 NOTE — Telephone Encounter (Signed)
 Pharmacy Patient Advocate Encounter  Received notification from [facility] MEDICAID that Prior Authorization for Qulipta  60MG  tablets has been APPROVED from 07/12/2023 to 10/10/2023. Ran test claim, Copay is $4.00. This test claim was processed through [facility]- copay amounts may vary at other pharmacies due to pharmacy/plan contracts, or as the patient moves through the different stages of their insurance plan.   PA #/Case ID/Reference #: PA Case ID #: [id number]

## 2023-07-12 NOTE — Telephone Encounter (Signed)
 Pharmacy Patient Advocate Encounter   Received notification from Pt Calls Messages that prior authorization for Qulipta  60MG  tablets is required/requested.   Insurance verification completed.   The patient is insured through [facility] .   Per test claim: PA required; PA submitted to above mentioned insurance via CoverMyMeds Key/confirmation #/EOC [id number] Status is pending

## 2023-07-19 ENCOUNTER — Encounter: Payer: Medicaid Other | Admitting: Student

## 2023-07-24 ENCOUNTER — Ambulatory Visit: Payer: Medicaid Other | Admitting: Neurology

## 2023-07-24 DIAGNOSIS — E1142 Type 2 diabetes mellitus with diabetic polyneuropathy: Secondary | ICD-10-CM

## 2023-07-24 DIAGNOSIS — R4182 Altered mental status, unspecified: Secondary | ICD-10-CM | POA: Diagnosis not present

## 2023-07-24 DIAGNOSIS — G43711 Chronic migraine without aura, intractable, with status migrainosus: Secondary | ICD-10-CM

## 2023-07-24 DIAGNOSIS — G43719 Chronic migraine without aura, intractable, without status migrainosus: Secondary | ICD-10-CM

## 2023-07-24 NOTE — Telephone Encounter (Signed)
 error

## 2023-08-01 ENCOUNTER — Ambulatory Visit
Admission: RE | Admit: 2023-08-01 | Discharge: 2023-08-01 | Disposition: A | Discharge status: Home / Self Care | Payer: Medicaid Other | Source: Ambulatory Visit | Attending: Neurology | Admitting: Neurology

## 2023-08-01 DIAGNOSIS — E1142 Type 2 diabetes mellitus with diabetic polyneuropathy: Secondary | ICD-10-CM

## 2023-08-01 DIAGNOSIS — G43719 Chronic migraine without aura, intractable, without status migrainosus: Secondary | ICD-10-CM

## 2023-08-01 DIAGNOSIS — G43711 Chronic migraine without aura, intractable, with status migrainosus: Secondary | ICD-10-CM | POA: Diagnosis not present

## 2023-08-01 MED ORDER — GADOPICLENOL 0.5 MMOL/ML IV SOLN
10.0000 mL | Freq: Once | INTRAVENOUS | Status: AC | PRN
  Administered 2023-08-01: 10 mL via INTRAVENOUS

## 2023-08-07 ENCOUNTER — Telehealth: Payer: Self-pay

## 2023-08-07 ENCOUNTER — Telehealth: Payer: Self-pay | Admitting: Anesthesiology

## 2023-08-07 NOTE — Telephone Encounter (Signed)
-----   Message from [name] sent at 08/04/2023  7:27 PM EST ----- Please call patient MRI of the brain was normal

## 2023-08-07 NOTE — Telephone Encounter (Signed)
 Lmtrc 1st attempt

## 2023-08-07 NOTE — Telephone Encounter (Signed)
Called pt and informed of normal MRI of brain results, per Dr Terrace Arabia. Also reminded pt of her upcoming Botox appointment with Margie Ege on 08/10/2023 at 3:45 pm. Pt verbalized understanding and thanked me for the call.

## 2023-08-08 ENCOUNTER — Encounter: Payer: Medicaid Other | Admitting: Student

## 2023-08-08 NOTE — Telephone Encounter (Signed)
Pt has been informed of MRI of brain results.

## 2023-08-10 ENCOUNTER — Ambulatory Visit: Payer: Medicaid Other | Admitting: Neurology

## 2023-08-10 DIAGNOSIS — G43719 Chronic migraine without aura, intractable, without status migrainosus: Secondary | ICD-10-CM | POA: Diagnosis not present

## 2023-08-10 MED ORDER — ONABOTULINUMTOXINA 100 UNITS IJ SOLR
155.0000 [IU] | Freq: Once | INTRAMUSCULAR | Status: AC
  Administered 2023-08-10: 155 [IU] via INTRAMUSCULAR

## 2023-08-10 MED ORDER — ONABOTULINUMTOXINA 100 UNITS IJ SOLR: 155.0000 [IU] | Freq: Once | INTRAMUSCULAR | Status: DC

## 2023-08-10 NOTE — Progress Notes (Signed)
   BOTOX  PROCEDURE NOTE FOR MIGRAINE HEADACHE  HISTORY: Here for Botox  injection. Last was 05/17/23 with me. Dr. Onita started Qulipta  60 mg daily, she just started it. Stopped Ajovy . Also started on Lamictal , currently taking Lamictal  100 mg BID, helping with migraines. Still having 2 migraines weekly, daily headache. Mentions when had EEG, some question of sleep apnea? Snoring, daytime fatigue, morning headache. ESS 10  Description of procedure:  The patient was placed in a sitting position. The standard protocol was used for Botox  as follows, with 5 units of Botox  injected at each site:  -Procerus muscle, midline injection  -Corrugator muscle, bilateral injection  -Frontalis muscle, bilateral injection, with 2 sites each side, medial injection was performed in the upper one third of the frontalis muscle, in the region vertical from the medial inferior edge of the superior orbital rim. The lateral injection was again in the upper one third of the forehead vertically above the lateral limbus of the cornea, 1.5 cm lateral to the medial injection site.  -Temporalis muscle injection, 4 sites, bilaterally. The first injection was 3 cm above the tragus of the ear, second injection site was 1.5 cm to 3 cm up from the first injection site in line with the tragus of the ear. The third injection site was 1.5-3 cm forward between the first 2 injection sites. The fourth injection site was 1.5 cm posterior to the second injection site.  -Occipitalis muscle injection, 3 sites, bilaterally. The first injection was done one half way between the occipital protuberance and the tip of the mastoid process behind the ear. The second injection site was done lateral and superior to the first, 1 fingerbreadth from the first injection. The third injection site was 1 fingerbreadth superiorly and medially from the first injection site.  -Cervical paraspinal muscle injection, 2 sites, bilateral, the first injection site was  1 cm from the midline of the cervical spine, 3 cm inferior to the lower border of the occipital protuberance. The second injection site was 1.5 cm superiorly and laterally to the first injection site.  -Trapezius muscle injection was performed at 3 sites, bilaterally. The first injection site was in the upper trapezius muscle halfway between the inflection point of the neck, and the acromion. The second injection site was one half way between the acromion and the first injection site. The third injection was done between the first injection site and the inflection point of the neck.  A 200 unit bottle of Botox  was used, 155 units were injected, the rest of the Botox  was wasted. The patient tolerated the procedure well, there were no complications of the above procedure.  Botox  NDC 9976-8854-98 Lot number [id number] Expiration date 11/2025  Referral for sleep consult. Mentions daily morning headache. When had EEG, mentioned some apnea noted. Reports snoring, daytime fatigue, BMI 49.

## 2023-08-10 NOTE — Progress Notes (Signed)
 Botox  100 units x 2 vial  Ndc-0023-1145-01 [id number] Exp-11/2025 S/P Bacteriostatic 0.9% Sodium Chloride - 4 mL  ZOX:[id number] Expiration: 05/04/2024 NDC: 4098-1191-47 Dx: W29.562 WITNESSED ZH:[id number] C CMA

## 2023-08-15 ENCOUNTER — Other Ambulatory Visit: Payer: Self-pay | Admitting: Student

## 2023-08-15 MED ORDER — ROSUVASTATIN CALCIUM 20 MG PO TABS
20.0000 mg | ORAL_TABLET | Freq: Every day | ORAL | 3 refills | Status: DC
Start: 2023-08-15 — End: 2023-09-06

## 2023-08-15 NOTE — Telephone Encounter (Signed)
Pt reports her pharmacy has sent 3 requests for the following medication to be filled with no response.  Pt states she was asked to contact her PCP.   rosuvastatin (CRESTOR) 20 MG tablet  [facility] - [place], [place] - [address]

## 2023-08-15 NOTE — Telephone Encounter (Signed)
Next appt scheduled 08/31/23.

## 2023-08-17 ENCOUNTER — Telehealth: Payer: Self-pay | Admitting: Neurology

## 2023-08-17 DIAGNOSIS — G43719 Chronic migraine without aura, intractable, without status migrainosus: Secondary | ICD-10-CM

## 2023-08-17 NOTE — Telephone Encounter (Signed)
Reviewed with Dr. Frances Furbish, will order sleep consult for evaluation of possible OSA.  I called the patient and left a message. See recent Botox procedure note for mention of symptoms. Thanks  Orders Placed This Encounter  Procedures   Ambulatory referral to Sleep Studies

## 2023-08-21 NOTE — Procedures (Signed)
   HISTORY: 58 year old female with frequent headache intermittent confusion, body shaking  TECHNIQUE:  This is a routine 16 channel EEG recording with one channel devoted to a limited EKG recording.  It was performed during wakefulness, drowsiness and asleep.  Hyperventilation and photic stimulation were performed as activating procedures.  There are minimum muscle and movement artifact noted.  Upon maximum arousal, posterior dominant waking rhythm consistent of rhythmic alpha range activity. Activities are symmetric over the bilateral posterior derivations and attenuated with eye opening.  Photic stimulation did not alter the tracing.  Hyperventilation produced mild/moderate buildup with higher amplitude and the slower activities noted.  During EEG recording, patient developed drowsiness and no deeper stage of sleep was achieved  During EEG recording, there was no epileptiform discharge noted.  EKG demonstrate normal sinus rhythm.  CONCLUSION: This is a  normal awake EEG.  There is no electrodiagnostic evidence of epileptiform discharge.  Levert Feinstein, M.D. Ph.D.  [facility] [address] [place], [place] [address] Phone: [phone] Fax:      [phone]

## 2023-08-31 ENCOUNTER — Ambulatory Visit: Payer: Medicaid Other | Admitting: Student

## 2023-08-31 ENCOUNTER — Other Ambulatory Visit (HOSPITAL_COMMUNITY)
Admission: RE | Admit: 2023-08-31 | Discharge: 2023-08-31 | Disposition: A | Discharge status: Home / Self Care | Payer: Medicaid Other | Source: Ambulatory Visit | Attending: Internal Medicine | Admitting: Internal Medicine

## 2023-08-31 VITALS — BP 142/64 | HR 70 | Temp 98.2°F | Ht 66.0 in | Wt 309.3 lb

## 2023-08-31 DIAGNOSIS — F32A Depression, unspecified: Secondary | ICD-10-CM | POA: Diagnosis not present

## 2023-08-31 DIAGNOSIS — F419 Anxiety disorder, unspecified: Secondary | ICD-10-CM

## 2023-08-31 DIAGNOSIS — E785 Hyperlipidemia, unspecified: Secondary | ICD-10-CM

## 2023-08-31 DIAGNOSIS — M5431 Sciatica, right side: Secondary | ICD-10-CM | POA: Diagnosis not present

## 2023-08-31 DIAGNOSIS — M79604 Pain in right leg: Secondary | ICD-10-CM | POA: Diagnosis not present

## 2023-08-31 DIAGNOSIS — Z794 Long term (current) use of insulin: Secondary | ICD-10-CM

## 2023-08-31 DIAGNOSIS — N898 Other specified noninflammatory disorders of vagina: Secondary | ICD-10-CM | POA: Insufficient documentation

## 2023-08-31 DIAGNOSIS — E781 Pure hyperglyceridemia: Secondary | ICD-10-CM

## 2023-08-31 DIAGNOSIS — Z7984 Long term (current) use of oral hypoglycemic drugs: Secondary | ICD-10-CM

## 2023-08-31 DIAGNOSIS — M5432 Sciatica, left side: Secondary | ICD-10-CM | POA: Diagnosis not present

## 2023-08-31 DIAGNOSIS — E1121 Type 2 diabetes mellitus with diabetic nephropathy: Secondary | ICD-10-CM

## 2023-08-31 DIAGNOSIS — I10 Essential (primary) hypertension: Secondary | ICD-10-CM

## 2023-08-31 DIAGNOSIS — E782 Mixed hyperlipidemia: Secondary | ICD-10-CM

## 2023-08-31 DIAGNOSIS — R300 Dysuria: Secondary | ICD-10-CM

## 2023-08-31 DIAGNOSIS — B379 Candidiasis, unspecified: Secondary | ICD-10-CM | POA: Diagnosis not present

## 2023-08-31 DIAGNOSIS — Z1211 Encounter for screening for malignant neoplasm of colon: Secondary | ICD-10-CM

## 2023-08-31 DIAGNOSIS — E1142 Type 2 diabetes mellitus with diabetic polyneuropathy: Secondary | ICD-10-CM

## 2023-08-31 DIAGNOSIS — K3184 Gastroparesis: Secondary | ICD-10-CM

## 2023-08-31 DIAGNOSIS — M79605 Pain in left leg: Secondary | ICD-10-CM

## 2023-08-31 DIAGNOSIS — E119 Type 2 diabetes mellitus without complications: Secondary | ICD-10-CM

## 2023-08-31 DIAGNOSIS — K5900 Constipation, unspecified: Secondary | ICD-10-CM | POA: Diagnosis not present

## 2023-08-31 DIAGNOSIS — F3341 Major depressive disorder, recurrent, in partial remission: Secondary | ICD-10-CM

## 2023-08-31 LAB — POCT GLYCOSYLATED HEMOGLOBIN (HGB A1C): Hemoglobin A1C: 10.5 % — AB (ref 4.0–5.6)

## 2023-08-31 LAB — GLUCOSE, CAPILLARY: Glucose-Capillary: 302 mg/dL — ABNORMAL HIGH (ref 70–99)

## 2023-08-31 MED ORDER — VENLAFAXINE HCL ER 37.5 MG PO CP24: 37.5000 mg | ORAL_CAPSULE | Freq: Every day | ORAL | 2 refills | Status: DC

## 2023-08-31 NOTE — Progress Notes (Addendum)
 CC:  Chief Complaint  Patient presents with   Back Pain   Hip Pain   Headache   Leg Pain   Diabetes   HPI:  [name] is a 58 y.o. female living with a history stated below and presents today for back pain and leg pain. Please see problem based assessment and plan for additional details.  Past Medical History:  Diagnosis Date   Acute gastritis without mention of hemorrhage 01/01/2002   Anemia 10/2009   macrocytic anemia with baseline MCV 104-106   Anxiety    Asthma    Boil 01/05/2017   Cellulitis 05/20/2020   Chondromalacia of right knee 04/20/2012   Complex tear of medial meniscus of left knee as current injury 02/25/2016   Complication of anesthesia    hard to wake up   Constipation 11/17/2009   COPD (chronic obstructive pulmonary disease) (HCC)    chronic bronchitis   Derangement of anterior horn of lateral meniscus of left knee 02/25/2016   Duodenitis without mention of hemorrhage 01/01/2002   DVT (deep venous thrombosis) (HCC)    history clot lt groin when she had lymphoma-in remision now.   Elevated triglycerides with high cholesterol 01/05/2017   Erythema 09/01/2021   Gastroparesis    GERD (gastroesophageal reflux disease)    Hiatal hernia 01/01/2002   HIV infection (HCC)    CD4 = 570 (05/2010), VL undetectation   Hyperlipidemia    IBS (irritable bowel syndrome)    Migraines    on topamax and triptans, frequent (almost daily) attacks    MRSA (methicillin resistant staph aureus) culture positive 12/20/2010   Non Hodgkin's lymphoma (HCC)    stage II, s/p resection, chemotherapy, radiotherapy, Dr. Cyndie Chime is her oncologist.    PUD (peptic ulcer disease) 07/1997   per EGD report 07/1997 with history of esophagitis   Restless leg syndrome 12/24/2015   Shingles    in lumbar dermatome   Spondylolisthesis of lumbar region    L5-S1   TMJ (dislocation of temporomandibular joint)    Traumatic tear of lateral meniscus of right knee 04/20/2012    Umbilical bleeding 09/01/2021   Vaginal yeast infection 09/25/2017    Current Outpatient Medications on File Prior to Visit  Medication Sig Dispense Refill   losartan (COZAAR) 50 MG tablet Take 50 mg by mouth daily.     rosuvastatin (CRESTOR) 20 MG tablet Take 20 mg by mouth daily.     albuterol (PROAIR HFA) 108 (90 Base) MCG/ACT inhaler USE 1 TO 2 PUFFS EVERY 4-6 HOURS AS NEEDED FOR SHORTNESS OF BREATH ORWHEEZING 8.5 g 1   Atogepant (QULIPTA) 60 MG TABS Take 1 tablet (60 mg total) by mouth daily. 30 tablet 11   Beclomethasone Dipropionate 80 MCG/ACT AERS Place 2 Inhalers into the nose daily. 8.7 g 10   blood glucose meter kit and supplies Dispense based on patient and insurance preference. Use up to four times daily as directed. (FOR ICD-9 250.00, 250.01). 1 each 0   botulinum toxin Type A (BOTOX) 100 units SOLR injection INJECT UP TO 200 UNITS  INTRAMUSCULARLY EVERY 90 DAYS 2 each 3   Calcium Carbonate-Vitamin D (CALCIUM-VITAMIN D) 500-200 MG-UNIT per tablet Take 2 tablets by mouth daily.      Continuous Blood Gluc Receiver (DEXCOM G7 RECEIVER) DEVI 1 each by Does not apply route daily. 1 each 0   Continuous Blood Gluc Sensor (DEXCOM G7 SENSOR) MISC 1 each by Does not apply route every 14 (fourteen) days. 9 each 3  Darunavir-Cobicistat-Emtricitabine-Tenofovir Alafenamide (SYMTUZA) 800-150-200-10 MG TABS Take 1 tablet by mouth daily with breakfast. 30 tablet 11   diclofenac Sodium (VOLTAREN) 1 % GEL APPLY TOPICALLY TO AFFECTED AREA 4 GRAMS 4 TIMES DAILY 100 g 2   FARXIGA 10 MG TABS tablet TAKE 1 TABLET BY MOUTH EVERY DAY 90 tablet 3   fluconazole (DIFLUCAN) 100 MG tablet Take 1 tablet (100 mg total) by mouth daily. 14 tablet 3   glucose blood test strip Use as instructed 100 each 12   GNP VITAMIN B-12 1000 MCG TBCR TAKE 1 TABLET BY MOUTH EVERY DAY 90 tablet 0   Insulin Aspart FlexPen (NOVOLOG) 100 UNIT/ML Inject into the skin.     Insulin Glargine (BASAGLAR KWIKPEN) 100 UNIT/ML SMARTSIG:30  Unit(s) SUB-Q Daily     lamoTRIgine (LAMICTAL) 100 MG tablet Take 1 tablet (100 mg total) by mouth 2 (two) times daily. 60 tablet 11   lamoTRIgine (LAMICTAL) 25 MG tablet 1 twice a day x1wk 2 twice a day x 2nd wk 3 twice a day x3rd wk 84 tablet 0   mometasone-formoterol (DULERA) 100-5 MCG/ACT AERO USE 2 PUFFS BY MOUTH THE FIRST THING IN THE MORNING AND THEN ANOTHER 2 PUFFS ABOUT 12 HOURS LATER 13 g 5   pantoprazole (PROTONIX) 40 MG tablet TAKE 1 TABLET BY MOUTH 2 TIMES A DAY FORACID REFLUX 60 tablet 4   Plecanatide (TRULANCE) 3 MG TABS Take 1 tablet by mouth daily. 30 tablet 0   propranolol (INDERAL) 20 MG tablet TAKE 1 TABLET BY MOUTH TWICE DAILY 180 tablet 1   Tamsulosin HCl (FLOMAX) 0.4 MG CAPS Take 0.4 mg by mouth daily. 1-2 daily     tiZANidine (ZANAFLEX) 4 MG tablet Take 1 tablet (4 mg total) by mouth every 8 (eight) hours as needed for muscle spasms. 90 tablet 11   zolmitriptan (ZOMIG) 5 MG tablet Take 1 tablet (5 mg total) by mouth as needed for migraine. 12 tablet 11   zolpidem (AMBIEN) 10 MG tablet TAKE 1 TABLET BY MOUTH AT BEDTIME AS NEEDED FOR SLEEP (TRY NOT TO TAKE NIGHTLY) 30 tablet 3   No current facility-administered medications on file prior to visit.    Family History  Problem Relation Age of Onset   Hypertension Mother    Stroke Mother    Heart disease Mother    Diabetes Mother    Colon polyps Mother    Breast cancer Paternal Grandmother    Kidney cancer Maternal Uncle    Heart disease Maternal Uncle    Diabetes Maternal Uncle    Diabetes Maternal Grandmother    Heart disease Maternal Grandmother    Emphysema Paternal Uncle        never smoker   Emphysema Paternal Grandfather        never smoker   Colon cancer Paternal Aunt        with met to lung    Non-Hodgkin's lymphoma Maternal Uncle    Heart disease Maternal Grandfather     Social History   Socioeconomic History   Marital status: Divorced    Spouse name: Not on file   Number of children: 0   Years  of education: 12   Highest education level: Not on file  Occupational History   Occupation: disabled    Employer: UNEMPLOYED  Tobacco Use   Smoking status: Never   Smokeless tobacco: Never  Vaping Use   Vaping status: Never Used  Substance and Sexual Activity   Alcohol use: No    Alcohol/week:  0.0 standard drinks of alcohol   Drug use: No   Sexual activity: Not Currently    Partners: Male    Birth control/protection: Surgical  Other Topics Concern   Not on file  Social History Narrative      Patient is disabled.   Right handed.   Caffeine - Diet soda., tea   Education - high school   Social Drivers of Corporate investment banker Strain: Not on file  Food Insecurity: Low Risk  (09/05/2023)   Received from [facility]   Hunger Vital Sign    Worried About Running Out of Food in the Last Year: Never true    Ran Out of Food in the Last Year: Never true  Transportation Needs: No Transportation Needs (09/05/2023)   Received from [facility]    In the past 12 months, has lack of reliable transportation kept you from medical appointments, meetings, work or from getting things needed for daily living? : No  Physical Activity: Not on file  Stress: Not on file  Social Connections: Not on file  Intimate Partner Violence: Not on file    Review of Systems: ROS negative except for what is noted on the assessment and plan.  Vitals:   08/31/23 1430  BP: (!) 142/64  Pulse: 70  Temp: 98.2 F (36.8 C)  TempSrc: Oral  SpO2: 94%  Weight: (!) 309 lb 4.8 oz (140.3 kg)  Height: 5\' 6"  (1.676 m)    Physical Exam: Constitutional: well-appearing, in no acute distress HENT: normocephalic atraumatic, mucous membranes moist Eyes: conjunctiva non-erythematous Cardiovascular: regular rate and rhythm, no m/r/g Pulmonary/Chest: normal work of breathing on room air, lungs clear to auscultation bilaterally Abdominal: soft, non-tender, non-distended, stool burden  palpated MSK: normal tone Neurological: alert & oriented x 3, no focal deficit, +straight leg test bilaterally Skin: warm and dry Psych: normal mood and behavior  Assessment & Plan:   Patient discussed with Dr. Sol Blazing  DM type 2 with diabetic peripheral neuropathy (HCC) History of poorly controlled diabetes her A1c is 10.5 she has a continuous glucose monitor but is unable to download the data today.  She follows with endocrinology for her diabetes control.  Currently on 100 units of Basaglar and NovoLog 50 units 3 times daily.  She also takes [place].  She would not be able to tolerate GLP-1's or ozempic due to nausea in the past.  I encouraged her to discuss her Marcelline Deist with her endocrinologist given that she has been having recurrent yeast infections.   Plan: -Follow-up with endocrinology  Dyslipidemia Currently taking Crestor 20 mg.  ASCVD risk is 9.2%, would need to be on a statin given that she does have diabetes.  Would be ideal to check her lipids when she is fasting given that she had elevated triglycerides in the past.   -Continue with Crestor 20 mg daily -Lab appointment set up for fasting blood test  Addendum: Triglycerides fasting are currently at >500mg . Pt will start Vascepa as not able to take fenofibrate with her Symtuza. Consider increasing Crestor if possible, although would need to check with ID if she is able to be on high dose statin with her Symtuza.  Vaginal discharge Reports cottage cheese like vaginal discharge as well as dysuria and itching. She has gotten the symptoms in the past and this is the second episode since she was last seen in our office in August.  She is currently not sexually active and would not like STD testing.  -  Obtain a urine sample -Vaginal swab for BV and Candida  Addendum: Cultures growing candida galbrata. Discussed with Dr. Sol Blazing (09/06/2023) At first prescription for terconazole was sent, which patient will discontinue and use boric acid  intravaginally. Pt aware and understands to NOT take them by mouth  Depression [name] has a history of depression, however she is not currently on any medication.  Has a depressed mood, guilt, and changes in eating habits. She is also anxious. She is unable to tell me why she has not been on any treatment for this.  However I think she would benefit from starting venlafaxine given that she has vasomotor symptoms of increased night sweats that have worsened recently and back pain with a positive straight leg test.   -Start Venlafaxine 37.5mg  daily   Leg pain, bilateral She is concerned that her back pain has been present for many years and radiates down her legs and it bothers her.  No saddle anesthesia. Has urinary retention and follows with urology. She takes Tylenol up to 3 g a day and reports also having tried physical therapy in the past. she will sometimes put ice on it.  Her symptoms have not improved with the above. She saw neurosurgery past and also received injections but these were not beneficial for her she was counseled about maybe needing a stimulator but she did not want to get this done.  She would like to be referred to neurosurgery today to consider what her options are.  In addition to her sciatica secondary to herniated disc, she has iliotibial band tenderness, and a BMI of 50. She has gained weight since her last appointment.  She does recognize that she needs to lose weight and that she has not been exercising or having good eating habits.  I counseled her extensively on the effects of obesity and that she would benefit from losing weight.  We discussed weight loss surgery but she would not like to explore that option.  He has not tolerated Ozempic or GLP-1 inhibitors.  Given her depression and anxiety she would benefit from venlafaxine.   -referral to neurosurgery  -Start Venlafaxine 37.5mg  daily   Constipation Has a history of constipation and diabetic gastroparesis.  She will  have a bowel movement once a week.  Reports this is regular for her.  She follows with GI for her gastroparesis is due for an EGD as well as a colonoscopy.  However she has been having issues with transportation as her only transportation is her sister and she recently got surgery.  She elects to focus on the after her sister gets better.  I encouraged her to make an appointment so she has an appointment ready once her sister recovers patient will call GI.   -Referral to Gastroenterology  -Increase fiber- high fiber foods provided  Manuela Neptune, MD [facility] Internal Medicine, PGY-1 Phone: [phone] Date 09/06/2023 Time 11:12 AM

## 2023-08-31 NOTE — Patient Instructions (Signed)
 Thank you, Ms.[name] for allowing [place] to provide your care today.   I have ordered the following labs for you:   Lab Orders         Glucose, capillary         TSH         Urinalysis with Culture Reflex         POC Hbg A1C       Referrals ordered today:    Referral Orders         Ambulatory referral to Neurosurgery         Ambulatory referral to Gastroenterology      I have ordered the following medication/changed the following medications:   Stop the following medications: Medications Discontinued During This Encounter  Medication Reason   azelastine (ASTELIN) 0.1 % nasal spray    Fremanezumab-vfrm (AJOVY) 225 MG/1.5ML SOAJ    terconazole (TERAZOL 7) 0.4 % vaginal cream    FLUoxetine (PROZAC) 20 MG capsule      Start the following medications: Meds ordered this encounter  Medications   venlafaxine XR (EFFEXOR XR) 37.5 MG 24 hr capsule    Sig: Take 1 capsule (37.5 mg total) by mouth daily.    Dispense:  30 capsule    Refill:  2     Follow up:  6 months    Remember:   Follow up with:   Endocrinology for your  diabetes.  Gastroenterology for your constipation and gastroparesis. You are also due for a colonoscopy.  Neurosurgery for your back pain.   Start Venlafaxine 37.5mg  daily   We discussed that it would be best to loose weight and eating more mindfully. Please try to eat healthier foods, avoiding processed surgars like candy, sweets, and empty calories in sugary drinks. Exercise. Physical exercises will make you stronger and help you with all the pain you are having.   Please let us know if you do not hear back from any of your referrals.    Should you have any questions or concerns please call the internal medicine clinic at [phone].     Manuela Neptune, MD [facility]

## 2023-09-01 DIAGNOSIS — N898 Other specified noninflammatory disorders of vagina: Secondary | ICD-10-CM | POA: Insufficient documentation

## 2023-09-01 NOTE — Assessment & Plan Note (Signed)
 History of poorly controlled diabetes her A1c is 10.5 she has a continuous glucose monitor but is unable to download the data today.  She follows with endocrinology for her diabetes control.  Currently on 100 units of Basaglar and NovoLog 50 units 3 times daily.  She also takes [place].  She would not be able to tolerate GLP-1's or ozempic due to nausea in the past.  I encouraged her to discuss her Marcelline Deist with her endocrinologist given that she has been having recurrent yeast infections.   Plan: -Follow-up with endocrinology

## 2023-09-01 NOTE — Assessment & Plan Note (Signed)
 Has a history of constipation and diabetic gastroparesis.  She will have a bowel movement once a week.  Reports this is regular for her.  She follows with GI for her gastroparesis is due for an EGD as well as a colonoscopy.  However she has been having issues with transportation as her only transportation is her sister and she recently got surgery.  She elects to focus on the after her sister gets better.  I encouraged her to make an appointment so she has an appointment ready once her sister recovers patient will call GI.   -Referral to Gastroenterology  -Increase fiber- high fiber foods provided

## 2023-09-01 NOTE — Assessment & Plan Note (Addendum)
 Reports cottage cheese like vaginal discharge as well as dysuria and itching. She has gotten the symptoms in the past and this is the second episode since she was last seen in our office in August.  She is currently not sexually active and would not like STD testing.  -Obtain a urine sample -Vaginal swab for BV and Candida  Addendum: Cultures growing candida galbrata. Discussed with Dr. Sol Blazing (09/06/2023) At first prescription for terconazole was sent, which patient will discontinue and use boric acid intravaginally. Pt aware and understands to NOT take them by mouth

## 2023-09-01 NOTE — Assessment & Plan Note (Addendum)
 Currently taking Crestor 20 mg.  ASCVD risk is 9.2%, would need to be on a statin given that she does have diabetes.  Would be ideal to check her lipids when she is fasting given that she had elevated triglycerides in the past.   -Continue with Crestor 20 mg daily -Lab appointment set up for fasting blood test  Addendum: Triglycerides fasting are currently at >500mg . Pt will start Vascepa as not able to take fenofibrate with her Symtuza. Consider increasing Crestor if possible, although would need to check with ID if she is able to be on high dose statin with her Symtuza.

## 2023-09-01 NOTE — Assessment & Plan Note (Signed)
 [name] has a history of depression, however she is not currently on any medication.  Has a depressed mood, guilt, and changes in eating habits. She is also anxious. She is unable to tell me why she has not been on any treatment for this.  However I think she would benefit from starting venlafaxine given that she has vasomotor symptoms of increased night sweats that have worsened recently and back pain with a positive straight leg test.   -Start Venlafaxine 37.5mg  daily

## 2023-09-01 NOTE — Assessment & Plan Note (Addendum)
 She is concerned that her back pain has been present for many years and radiates down her legs and it bothers her.  No saddle anesthesia. Has urinary retention and follows with urology. She takes Tylenol up to 3 g a day and reports also having tried physical therapy in the past. she will sometimes put ice on it.  Her symptoms have not improved with the above. She saw neurosurgery past and also received injections but these were not beneficial for her she was counseled about maybe needing a stimulator but she did not want to get this done.  She would like to be referred to neurosurgery today to consider what her options are.  In addition to her sciatica secondary to herniated disc, she has iliotibial band tenderness, and a BMI of 50. She has gained weight since her last appointment.  She does recognize that she needs to lose weight and that she has not been exercising or having good eating habits.  I counseled her extensively on the effects of obesity and that she would benefit from losing weight.  We discussed weight loss surgery but she would not like to explore that option.  He has not tolerated Ozempic or GLP-1 inhibitors.  Given her depression and anxiety she would benefit from venlafaxine.   -referral to neurosurgery  -Start Venlafaxine 37.5mg  daily

## 2023-09-02 LAB — LIPID PANEL
Chol/HDL Ratio: 6.1 ratio — ABNORMAL HIGH (ref 0.0–4.4)
Cholesterol, Total: 218 mg/dL — ABNORMAL HIGH (ref 100–199)
HDL: 36 mg/dL — ABNORMAL LOW (ref >39)
LDL Chol Calc (NIH): 85 mg/dL (ref 0–99)
Triglycerides: 596 mg/dL (ref 0–149)
VLDL Cholesterol Cal: 97 mg/dL — ABNORMAL HIGH (ref 5–40)

## 2023-09-02 LAB — TSH: TSH: 2.63 u[IU]/mL (ref 0.450–4.500)

## 2023-09-03 LAB — CERVICOVAGINAL ANCILLARY ONLY
Bacterial Vaginitis (gardnerella): NEGATIVE
Candida Glabrata: POSITIVE — AB
Candida Vaginitis: NEGATIVE
Comment: NEGATIVE
Comment: NEGATIVE
Comment: NEGATIVE

## 2023-09-04 ENCOUNTER — Telehealth: Payer: Self-pay

## 2023-09-04 MED ORDER — TERCONAZOLE 0.4 % VA CREA: 1.0000 | TOPICAL_CREAM | Freq: Every day | VAGINAL | 0 refills | Status: DC

## 2023-09-04 MED ORDER — FENOFIBRATE 48 MG PO TABS: 48.0000 mg | ORAL_TABLET | Freq: Every day | ORAL | 11 refills | Status: DC

## 2023-09-04 NOTE — Addendum Note (Signed)
 Addended by: Manuela Neptune on: 09/04/2023 12:07 PM   Modules accepted: Orders

## 2023-09-04 NOTE — Telephone Encounter (Signed)
 Pt is requesting a call back ... She stated at her last OV  she was to get a new med for her lipids  she is wondering if the DR changed her mind

## 2023-09-04 NOTE — Telephone Encounter (Signed)
 Pt stated she's already on Crestor and needs another med added d/t her triglycerides (596 on 2/27).

## 2023-09-04 NOTE — Progress Notes (Signed)
 Attempted to call patient with no success x2. Sent prescription for fenofibrate 48mg  to use once daily, will need to recheck lipid panel and LFTs in one month (fasting). She should eat healthier, less fatty foods and exercise. Also sent prescription for vaginal cream (terconazole) for her yeast infection. She is to use one applicator once a week for 7 days and then twice weekly for prevention. I encourage her to discuss the use of Farxiga with her endocrinologist given the recurrent yeast infections.

## 2023-09-04 NOTE — Telephone Encounter (Signed)
 Pt was called and informed of Dr Dorita Fray response of Fenofibrate and vaginal cream.

## 2023-09-05 ENCOUNTER — Other Ambulatory Visit: Payer: Medicaid Other

## 2023-09-05 ENCOUNTER — Telehealth: Payer: Self-pay

## 2023-09-05 DIAGNOSIS — E1169 Type 2 diabetes mellitus with other specified complication: Secondary | ICD-10-CM | POA: Diagnosis not present

## 2023-09-05 NOTE — Telephone Encounter (Signed)
 Pt states she cannot take fenofibrate (TRICOR) 48 MG tablet because it will interfered with her HIV medication. Requesting a call back.

## 2023-09-06 MED ORDER — ICOSAPENT ETHYL 1 G PO CAPS: 2.0000 g | ORAL_CAPSULE | Freq: Two times a day (BID) | ORAL | 11 refills | Status: DC

## 2023-09-06 MED ORDER — BORIC ACID VAGINAL 600 MG VA SUPP: 600.0000 mg | Freq: Every evening | VAGINAL | 0 refills | Status: DC

## 2023-09-06 NOTE — Addendum Note (Signed)
 Addended by: Manuela Neptune on: 09/06/2023 11:12 AM   Modules accepted: Orders

## 2023-09-07 NOTE — Progress Notes (Signed)
 Internal Medicine Clinic Attending  Case discussed with the resident at the time of the visit.  We reviewed the resident's history and exam and pertinent patient test results.  I agree with the assessment, diagnosis, and plan of care documented in the resident's note.

## 2023-09-11 ENCOUNTER — Other Ambulatory Visit (HOSPITAL_COMMUNITY): Payer: Self-pay

## 2023-09-15 ENCOUNTER — Other Ambulatory Visit (HOSPITAL_COMMUNITY): Payer: Self-pay

## 2023-09-15 ENCOUNTER — Telehealth: Payer: Self-pay

## 2023-09-15 NOTE — Telephone Encounter (Signed)
 Pharmacy Patient Advocate Encounter   Received notification from CoverMyMeds that prior authorization for Qulipta 60MG  tablets is required/requested.   Insurance verification completed.   The patient is insured through [employer] .   KEY [id number]  Per test claim: Prior Authorization form/request asks a question that requires your assistance. Please see the question below and advise accordingly. The PA will not be submitted until the necessary information is received.

## 2023-09-18 NOTE — Telephone Encounter (Signed)
 Asked pt if they have experienced decrease in ha's and migraines and she stated yes she has experienced a decrease in frequency and intensity

## 2023-09-18 NOTE — Telephone Encounter (Signed)
 Lvm 1st attempt by hf 09/18/23

## 2023-09-19 NOTE — Telephone Encounter (Signed)
Clinical questions have been submitted-awaiting determination. 

## 2023-09-19 NOTE — Telephone Encounter (Signed)
 Pharmacy Patient Advocate Encounter  Received notification from [facility] that Prior Authorization for Qulipta 60MG  tablets has been APPROVED from 09-19-2023 to 09-18-2024   PA #/Case ID/Reference #: [name]

## 2023-09-20 ENCOUNTER — Ambulatory Visit: Payer: Medicaid Other | Admitting: Neurology

## 2023-09-20 VITALS — BP 151/73 | HR 71 | Ht 66.0 in | Wt 311.0 lb

## 2023-09-20 DIAGNOSIS — Z6841 Body Mass Index (BMI) 40.0 and over, adult: Secondary | ICD-10-CM | POA: Diagnosis not present

## 2023-09-20 DIAGNOSIS — Z9189 Other specified personal risk factors, not elsewhere classified: Secondary | ICD-10-CM

## 2023-09-20 DIAGNOSIS — R0683 Snoring: Secondary | ICD-10-CM

## 2023-09-20 DIAGNOSIS — R351 Nocturia: Secondary | ICD-10-CM | POA: Diagnosis not present

## 2023-09-20 DIAGNOSIS — G4700 Insomnia, unspecified: Secondary | ICD-10-CM | POA: Diagnosis not present

## 2023-09-20 DIAGNOSIS — R0681 Apnea, not elsewhere classified: Secondary | ICD-10-CM

## 2023-09-20 DIAGNOSIS — R635 Abnormal weight gain: Secondary | ICD-10-CM | POA: Diagnosis not present

## 2023-09-20 DIAGNOSIS — R519 Headache, unspecified: Secondary | ICD-10-CM | POA: Diagnosis not present

## 2023-09-20 NOTE — Patient Instructions (Signed)

## 2023-09-20 NOTE — Progress Notes (Signed)
 Subjective:    Patient ID: [name] is a 58 y.o. female.  HPI    Huston Foley, MD, PhD [facility] [address] [address] [place], [place] [address]  Dear Maralyn Sago and Vivia Ewing,  I saw your patient, [name], upon your kind request in my sleep clinic today for initial consultation of her sleep disorder, in particular, concern for underlying obstructive sleep apnea.  The patient is unaccompanied today.  As you know, [name] is a 58 year old female with an underlying complex medical history of DVT, non-Hodgkin's lymphoma, status post chemotherapy and radiation therapy, COPD, history of cellulitis, asthma, anxiety, reflux disease and peptic ulcer disease, hiatal hernia, gastroparesis, HIV disease, hyperlipidemia, irritable bowel syndrome, migraine headaches, TMJ dysfunction, anemia, restless leg syndrome, history of shingles, and morbid obesity with a BMI of over 50, who reports snoring and excessive daytime somnolence.  Her Epworth sleepiness score is 3 out of 24, fatigue severity score is 56/63.  I reviewed your visit note from 08/10/2023.  She receives Botox injections for her migraines.  She has never had a sleep study.  He reports chronic difficulty initiating and maintaining sleep for years.  She has been on Ambien for about 3 to 4 years, she takes it nightly, 10 mg strength.  She is working on weight loss but has had quite a bit of weight fluctuation, she is also working on better diabetes control.  She reports that her last A1c was around 10.  It was indeed 10.1 about 2 weeks ago.  She does not have a set bedtime or rise time, may be in bed somewhere between 8 PM and 2 AM.  She has a rise time typically between 6 and 7.  She lives with her mom.  They sometimes sleep in the same room.  She is not aware of any family history of sleep apnea but it sounds like her mother has been encouraged to get tested for sleep apnea as well.  She has nocturia about 3 times per  average night and reports frequent morning headaches.  She drinks caffeine in the form of tea and diet soda but some of her beverages are caffeine free.  She is a non-smoker, does not vape nicotine, she does not drink any alcohol.   Her Past Medical History Is Significant For: Past Medical History:  Diagnosis Date   Acute gastritis without mention of hemorrhage 01/01/2002   Anemia 10/2009   macrocytic anemia with baseline MCV 104-106   Anxiety    Asthma    Boil 01/05/2017   Cellulitis 05/20/2020   Chondromalacia of right knee 04/20/2012   Complex tear of medial meniscus of left knee as current injury 02/25/2016   Complication of anesthesia    hard to wake up   Constipation 11/17/2009   COPD (chronic obstructive pulmonary disease) (HCC)    chronic bronchitis   Derangement of anterior horn of lateral meniscus of left knee 02/25/2016   Duodenitis without mention of hemorrhage 01/01/2002   DVT (deep venous thrombosis) (HCC)    history clot lt groin when she had lymphoma-in remision now.   Elevated triglycerides with high cholesterol 01/05/2017   Erythema 09/01/2021   Gastroparesis    GERD (gastroesophageal reflux disease)    Hiatal hernia 01/01/2002   HIV infection (HCC)    CD4 = 570 (05/2010), VL undetectation   Hyperlipidemia    IBS (irritable bowel syndrome)    Migraines    on topamax and triptans, frequent (almost  daily) attacks    MRSA (methicillin resistant staph aureus) culture positive 12/20/2010   Non Hodgkin's lymphoma (HCC)    stage II, s/p resection, chemotherapy, radiotherapy, Dr. Cyndie Chime is her oncologist.    PUD (peptic ulcer disease) 07/1997   per EGD report 07/1997 with history of esophagitis   Restless leg syndrome 12/24/2015   Shingles    in lumbar dermatome   Spondylolisthesis of lumbar region    L5-S1   TMJ (dislocation of temporomandibular joint)    Traumatic tear of lateral meniscus of right knee 04/20/2012   Umbilical bleeding 09/01/2021    Vaginal yeast infection 09/25/2017    Her Past Surgical History Is Significant For: Past Surgical History:  Procedure Laterality Date   CHOLECYSTECTOMY     ENDOMETRIAL ABLATION     KNEE ARTHROSCOPY Right 04/20/12   KNEE ARTHROSCOPY WITH LATERAL MENISECTOMY Left 02/25/2016   Procedure: LEFT KNEE ARTHROSCOPY WITH MEDIAL AND  LATERAL MENISECTOMIES;  Surgeon: Teryl Lucy, MD;  Location: [facility];  Service: Orthopedics;  Laterality: Left;   KNEE ARTHROSCOPY WITH MEDIAL MENISECTOMY Left 02/25/2016   Procedure: KNEE ARTHROSCOPY WITH MEDIAL MENISECTOMY;  Surgeon: Teryl Lucy, MD;  Location: [facility];  Service: Orthopedics;  Laterality: Left;   TONSILLECTOMY     TYMPANOPLASTY Left    TYMPANOSTOMY TUBE PLACEMENT     as child-both   UPPER GASTROINTESTINAL ENDOSCOPY  2003, 2007   done by Dr. Corinda Gubler    Her Family History Is Significant For: Family History  Problem Relation Age of Onset   Hypertension Mother    Stroke Mother    Heart disease Mother    Diabetes Mother    Colon polyps Mother    Breast cancer Paternal Grandmother    Kidney cancer Maternal Uncle    Heart disease Maternal Uncle    Diabetes Maternal Uncle    Diabetes Maternal Grandmother    Heart disease Maternal Grandmother    Emphysema Paternal Uncle        never smoker   Emphysema Paternal Grandfather        never smoker   Colon cancer Paternal Aunt        with met to lung    Non-Hodgkin's lymphoma Maternal Uncle    Heart disease Maternal Grandfather     Her Social History Is Significant For: Social History   Socioeconomic History   Marital status: Divorced    Spouse name: Not on file   Number of children: 0   Years of education: 12   Highest education level: Not on file  Occupational History   Occupation: disabled    Associate Professor: UNEMPLOYED  Tobacco Use   Smoking status: Never   Smokeless tobacco: Never  Vaping Use   Vaping status: Never Used  Substance and Sexual  Activity   Alcohol use: No    Alcohol/week: 0.0 standard drinks of alcohol   Drug use: No   Sexual activity: Not Currently    Partners: Male    Birth control/protection: Surgical  Other Topics Concern   Not on file  Social History Narrative      Patient is disabled.   Right handed.   Caffeine - Diet soda., tea   Education - high school   Social Drivers of Corporate investment banker Strain: Not on file  Food Insecurity: Low Risk  (09/05/2023)   Received from [facility]   Hunger Vital Sign    Worried About Running Out of Food in the Last Year: Never  true    Ran Out of Food in the Last Year: Never true  Transportation Needs: No Transportation Needs (09/05/2023)   Received from [facility]    In the past 12 months, has lack of reliable transportation kept you from medical appointments, meetings, work or from getting things needed for daily living? : No  Physical Activity: Not on file  Stress: Not on file  Social Connections: Not on file    Her Allergies Are:  Allergies  Allergen Reactions   Divalproex Sodium Nausea And Vomiting    Causes light-headedness   Gabapentin Nausea And Vomiting    confusion   Ondansetron Hcl Nausea And Vomiting    Triggers migraines, "makes me vomit"   Pineapple    Propoxyphene N-Acetaminophen Nausea And Vomiting   Simvastatin     PATIENT CANNOT BE PRESCRIBED THIS STATIN WHILE RECEIVING NORVIR   Zocor [Simvastatin - High Dose]    Penicillins     REACTION: lips swollen to point of bleeding, sever vaginal irritation  Reports tolerating augmentin well without any complaints or reaction  :   Her Current Medications Are:  Outpatient Encounter Medications as of 09/20/2023  Medication Sig   albuterol (PROAIR HFA) 108 (90 Base) MCG/ACT inhaler USE 1 TO 2 PUFFS EVERY 4-6 HOURS AS NEEDED FOR SHORTNESS OF BREATH ORWHEEZING   Atogepant (QULIPTA) 60 MG TABS Take 1 tablet (60 mg total) by mouth daily.   Beclomethasone Dipropionate 80  MCG/ACT AERS Place 2 Inhalers into the nose daily.   blood glucose meter kit and supplies Dispense based on patient and insurance preference. Use up to four times daily as directed. (FOR ICD-9 250.00, 250.01).   Boric Acid Vaginal 600 MG SUPP Place 600 mg vaginally at bedtime.   botulinum toxin Type A (BOTOX) 100 units SOLR injection INJECT UP TO 200 UNITS  INTRAMUSCULARLY EVERY 90 DAYS   Calcium Carbonate-Vitamin D (CALCIUM-VITAMIN D) 500-200 MG-UNIT per tablet Take 2 tablets by mouth daily.    Continuous Blood Gluc Receiver (DEXCOM G7 RECEIVER) DEVI 1 each by Does not apply route daily.   Continuous Blood Gluc Sensor (DEXCOM G7 SENSOR) MISC 1 each by Does not apply route every 14 (fourteen) days.   Darunavir-Cobicistat-Emtricitabine-Tenofovir Alafenamide (SYMTUZA) 800-150-200-10 MG TABS Take 1 tablet by mouth daily with breakfast.   diclofenac Sodium (VOLTAREN) 1 % GEL APPLY TOPICALLY TO AFFECTED AREA 4 GRAMS 4 TIMES DAILY   FARXIGA 10 MG TABS tablet TAKE 1 TABLET BY MOUTH EVERY DAY   fluconazole (DIFLUCAN) 100 MG tablet Take 1 tablet (100 mg total) by mouth daily.   glucose blood test strip Use as instructed   GNP VITAMIN B-12 1000 MCG TBCR TAKE 1 TABLET BY MOUTH EVERY DAY   icosapent Ethyl (VASCEPA) 1 g capsule Take 2 capsules (2 g total) by mouth 2 (two) times daily. With meals   Insulin Aspart FlexPen (NOVOLOG) 100 UNIT/ML Inject into the skin.   Insulin Glargine (BASAGLAR KWIKPEN) 100 UNIT/ML SMARTSIG:30 Unit(s) SUB-Q Daily   lamoTRIgine (LAMICTAL) 100 MG tablet Take 1 tablet (100 mg total) by mouth 2 (two) times daily.   lamoTRIgine (LAMICTAL) 25 MG tablet 1 twice a day x1wk 2 twice a day x 2nd wk 3 twice a day x3rd wk   losartan (COZAAR) 50 MG tablet Take 50 mg by mouth daily.   mometasone-formoterol (DULERA) 100-5 MCG/ACT AERO USE 2 PUFFS BY MOUTH THE FIRST THING IN THE MORNING AND THEN ANOTHER 2 PUFFS ABOUT 12 HOURS LATER  MOUNJARO 2.5 MG/0.5ML Pen Inject 2.5 mg into the skin once a  week.   pantoprazole (PROTONIX) 40 MG tablet TAKE 1 TABLET BY MOUTH 2 TIMES A DAY FORACID REFLUX   Plecanatide (TRULANCE) 3 MG TABS Take 1 tablet by mouth daily.   propranolol (INDERAL) 20 MG tablet TAKE 1 TABLET BY MOUTH TWICE DAILY   rosuvastatin (CRESTOR) 20 MG tablet Take 20 mg by mouth daily.   Tamsulosin HCl (FLOMAX) 0.4 MG CAPS Take 0.4 mg by mouth daily. 1-2 daily   tiZANidine (ZANAFLEX) 4 MG tablet Take 1 tablet (4 mg total) by mouth every 8 (eight) hours as needed for muscle spasms.   venlafaxine XR (EFFEXOR XR) 37.5 MG 24 hr capsule Take 1 capsule (37.5 mg total) by mouth daily.   zolmitriptan (ZOMIG) 5 MG tablet Take 1 tablet (5 mg total) by mouth as needed for migraine.   zolpidem (AMBIEN) 10 MG tablet TAKE 1 TABLET BY MOUTH AT BEDTIME AS NEEDED FOR SLEEP (TRY NOT TO TAKE NIGHTLY)   No facility-administered encounter medications on file as of 09/20/2023.  :   Review of Systems:  Out of a complete 14 point review of systems, all are reviewed and negative with the exception of these symptoms as listed below:  Review of Systems  Neurological:        Patient in room #9 and alone. Patient states she been having has morning headache, snoring and daytime fatigue. Patient states she wakes up in the middle of the night and sometime stay up days at a time. ESS-3 , FSS-56    Objective:  Neurological Exam  Physical Exam Physical Examination:   Vitals:   09/20/23 1357  BP: (!) 151/73  Pulse: 71    General Examination: The patient is a very pleasant 58 y.o. female in no acute distress.   HEENT: Normocephalic, atraumatic, pupils are equal, round and reactive to light, extraocular tracking is good without limitation to gaze excursion or nystagmus noted. Hearing is grossly intact. Face is symmetric with normal facial animation. Speech is clear with no dysarthria noted. There is no hypophonia. There is no lip, neck/head, jaw or voice tremor. Neck is supple with full range of passive  and active motion. There are no carotid bruits on auscultation. Oropharynx exam reveals: moderate mouth dryness, marginal dental hygiene with several missing teeth, and moderate airway crowding, due to Mallampati class IV. Tonsils and uvula not fully visualized. Neck size of 17 three-quarter inches  Chest: Clear to auscultation without wheezing, rhonchi or crackles noted.  Heart: S1+S2+0, regular and normal without murmurs, rubs or gallops noted.   Abdomen: Soft, non-tender and non-distended.  Extremities: There is no pitting edema in the distal lower extremities bilaterally.   Skin: Warm and dry without trophic changes noted.   Musculoskeletal: exam reveals no obvious joint deformities.   Neurologically:  Mental status: The patient is awake, alert and oriented in all 4 spheres. Her immediate and remote memory, attention, language skills and fund of knowledge are appropriate. There is no evidence of aphasia, agnosia, apraxia or anomia. Speech is clear with normal prosody and enunciation. Thought process is linear. Mood is normal and affect is normal.  Cranial nerves II - XII are as described above under HEENT exam.  Motor exam: Normal bulk, strength and tone is noted. There is no obvious action or resting tremor.  Fine motor skills and coordination: grossly intact.  Cerebellar testing: No dysmetria or intention tremor. There is no truncal or gait ataxia.  Sensory  exam: intact to light touch in the upper and lower extremities.  Gait, station and balance: she stands easily. No veering to one side is noted. No leaning to one side is noted. Posture is age-appropriate and stance is narrow based. Gait shows normal stride length and normal pace. No problems turning are noted.   Assessment and plan:  In summary, [name] is a very pleasant 58 y.o.-year old female with an underlying complex medical history of DVT, non-Hodgkin's lymphoma, status post chemotherapy and radiation therapy, COPD,  history of cellulitis, asthma, anxiety, reflux disease and peptic ulcer disease, hiatal hernia, gastroparesis, HIV disease, hyperlipidemia, irritable bowel syndrome, migraine headaches, TMJ dysfunction, anemia, restless leg syndrome, history of shingles, and morbid obesity with a BMI of over 50, whose history and physical exam are concerning for sleep disordered breathing, particularly obstructive sleep apnea (OSA).  While a laboratory attended sleep study is typically considered "gold standard" for evaluation of sleep disordered breathing, she would prefer a home sleep test at this time.   I had a long chat with the patient about my findings and the diagnosis of sleep apnea, particularly OSA, its prognosis and treatment options. We talked about medical/conservative treatments, surgical interventions and non-pharmacological approaches for symptom control. I explained, in particular, the risks and ramifications of untreated moderate to severe OSA, especially with respect to developing cardiovascular disease down the road, including congestive heart failure (CHF), difficult to treat hypertension, cardiac arrhythmias (particularly A-fib), neurovascular complications including TIA, stroke and dementia. Even type 2 diabetes has, in part, been linked to untreated OSA. Symptoms of untreated OSA may include (but may not be limited to) daytime sleepiness, nocturia (i.e. frequent nighttime urination), memory problems, mood irritability and suboptimally controlled or worsening mood disorder such as depression and/or anxiety, lack of energy, lack of motivation, physical discomfort, as well as recurrent headaches, especially morning or nocturnal headaches. We talked about the importance of maintaining a healthy lifestyle and striving for healthy weight. In addition, we talked about the importance of striving for and maintaining good sleep hygiene, particularly maintaining a set schedule for sleep. I recommended a sleep study  at this time. I outlined the differences between a laboratory attended sleep study which is considered more comprehensive and accurate over the option of a home sleep test (HST); the latter may lead to underestimation of sleep disordered breathing in some instances and does not help with diagnosing upper airway resistance syndrome and is not accurate enough to diagnose primary central sleep apnea typically. I outlined possible surgical and non-surgical treatment options of OSA, including the use of a positive airway pressure (PAP) device (i.e. CPAP, AutoPAP/APAP or BiPAP in certain circumstances), a custom-made dental device (aka oral appliance, which would require a referral to a specialist dentist or orthodontist typically, and is generally speaking not considered for patients with full dentures or edentulous state), upper airway surgical options, such as traditional UPPP (which is not considered a first-line treatment) or the Inspire device (hypoglossal nerve stimulator, which would involve a referral for consultation with an ENT surgeon, after careful selection, following inclusion criteria - also not first-line treatment). I explained the PAP treatment option to the patient in detail, as this is generally considered first-line treatment.  The patient indicated that she would be willing to try PAP therapy, if the need arises. I explained the importance of being compliant with PAP treatment, not only for insurance purposes but primarily to improve patient's symptoms symptoms, and for the patient's long term health benefit,  including to reduce Her cardiovascular risks longer-term.    We will pick up our discussion about the next steps and treatment options after testing.  We will keep her posted as to the test results by phone call and/or MyChart messaging where possible.  We will plan to follow-up in sleep clinic accordingly as well.  I answered all her questions today and the patient was in agreement.   I  encouraged her to call with any interim questions, concerns, problems or updates or email [place] through MyChart.  Generally speaking, sleep test authorizations may take up to 2 weeks, sometimes less, sometimes longer, the patient is encouraged to get in touch with [place] if they do not hear back from the sleep lab staff directly within the next 2 weeks.  Thank you very much for allowing me to participate in the care of this nice patient. If I can be of any further assistance to you please do not hesitate to talk to me.  Sincerely,   Huston Foley, MD, PhD

## 2023-09-26 DIAGNOSIS — Z1384 Encounter for screening for dental disorders: Secondary | ICD-10-CM | POA: Diagnosis not present

## 2023-09-26 DIAGNOSIS — K029 Dental caries, unspecified: Secondary | ICD-10-CM | POA: Diagnosis not present

## 2023-10-05 DIAGNOSIS — Z0120 Encounter for dental examination and cleaning without abnormal findings: Secondary | ICD-10-CM | POA: Diagnosis not present

## 2023-10-07 NOTE — Addendum Note (Signed)
 Addended by: Elza Rafter on: 10/07/2023 06:12 AM   Modules accepted: Orders

## 2023-10-09 ENCOUNTER — Other Ambulatory Visit: Payer: Self-pay | Admitting: Neurology

## 2023-10-13 ENCOUNTER — Other Ambulatory Visit: Payer: Self-pay | Admitting: Neurology

## 2023-10-16 ENCOUNTER — Encounter: Admitting: Student

## 2023-10-18 ENCOUNTER — Telehealth: Payer: Self-pay | Admitting: Neurology

## 2023-10-18 NOTE — Telephone Encounter (Signed)
 Appointment details confirmed

## 2023-10-19 ENCOUNTER — Encounter: Admitting: Student

## 2023-10-23 ENCOUNTER — Other Ambulatory Visit: Payer: Self-pay | Admitting: Student

## 2023-10-23 DIAGNOSIS — G4700 Insomnia, unspecified: Secondary | ICD-10-CM

## 2023-10-23 NOTE — Telephone Encounter (Signed)
 Copied from CRM [id number]. Topic: Clinical - Medication Refill >> Oct 23, 2023 11:50 AM Bearl Botts A wrote: Most Recent Primary Care Visit:  Provider: Jose Ngo  Department: [facility]  Visit Type: OPEN ESTABLISHED  Date: 08/31/2023  Medication: zolpidem  (AMBIEN ) 10 MG tablet   Has the patient contacted their pharmacy? No   Is this the correct pharmacy for this prescription? Yes  This is the patient's preferred pharmacy:  [facility] - [place], [place] - [address] [place] [address] Phone: [phone] Fax: [phone]    Has the prescription been filled recently? No  Is the patient out of the medication? No  Has the patient been seen for an appointment in the last year OR does the patient have an upcoming appointment? Yes  Can we respond through MyChart? No  Agent: Please be advised that Rx refills may take up to 3 business days. We ask that you follow-up with your pharmacy.

## 2023-10-24 ENCOUNTER — Encounter

## 2023-10-24 ENCOUNTER — Encounter: Admitting: Student

## 2023-10-25 ENCOUNTER — Other Ambulatory Visit: Payer: Self-pay | Admitting: Internal Medicine

## 2023-10-25 ENCOUNTER — Encounter: Payer: Self-pay | Admitting: Student

## 2023-10-25 ENCOUNTER — Ambulatory Visit: Admitting: Student

## 2023-10-25 ENCOUNTER — Other Ambulatory Visit: Payer: Self-pay | Admitting: Student

## 2023-10-25 VITALS — BP 138/58 | HR 74 | Wt 304.8 lb

## 2023-10-25 DIAGNOSIS — F3341 Major depressive disorder, recurrent, in partial remission: Secondary | ICD-10-CM

## 2023-10-25 DIAGNOSIS — G4700 Insomnia, unspecified: Secondary | ICD-10-CM

## 2023-10-25 DIAGNOSIS — R339 Retention of urine, unspecified: Secondary | ICD-10-CM

## 2023-10-25 DIAGNOSIS — F32A Depression, unspecified: Secondary | ICD-10-CM

## 2023-10-25 DIAGNOSIS — I10 Essential (primary) hypertension: Secondary | ICD-10-CM | POA: Diagnosis not present

## 2023-10-25 DIAGNOSIS — B20 Human immunodeficiency virus [HIV] disease: Secondary | ICD-10-CM | POA: Diagnosis not present

## 2023-10-25 DIAGNOSIS — E785 Hyperlipidemia, unspecified: Secondary | ICD-10-CM | POA: Diagnosis not present

## 2023-10-25 DIAGNOSIS — F419 Anxiety disorder, unspecified: Secondary | ICD-10-CM

## 2023-10-25 MED ORDER — VENLAFAXINE HCL ER 75 MG PO CP24: 75.0000 mg | ORAL_CAPSULE | Freq: Every day | ORAL | 2 refills | Status: DC

## 2023-10-25 MED ORDER — ZOLPIDEM TARTRATE 10 MG PO TABS: ORAL_TABLET | ORAL | 3 refills | Status: DC

## 2023-10-25 NOTE — Assessment & Plan Note (Signed)
 F/u visit after starting Vascepa  in setting of hypertriglyceridemia. Last lipid panel on 2/27 with TGC 596 and LDL 85. Currently taking Crestor  20 mg daily and Vascepa  2 g BID. Not eaten today, will get fasting lipid panel.   Plan -Lipid panel today -Continue Crestor  and Vascepa 

## 2023-10-25 NOTE — Assessment & Plan Note (Signed)
 BP mildly elevated 138/58. Currently not on any BP meds. Denies hypertensive symptoms. Was on ARB but stopped due to hair loss. Stopped lisinopril  due to concern for cough.    Plan -Reassess BP at next visit -If consistently elevated may need to restart antihypertensive

## 2023-10-25 NOTE — Assessment & Plan Note (Signed)
 Follows with ID with next appt on 03/2024. Doing well on Symtuza .

## 2023-10-25 NOTE — Progress Notes (Signed)
 CC: HLD f/u  HPI:  [name] is a 58 y.o. female living with a history stated below and presents today for HLD f/u. Please see problem based assessment and plan for additional details.  Past Medical History:  Diagnosis Date   Acute gastritis without mention of hemorrhage 01/01/2002   Anemia 10/2009   macrocytic anemia with baseline MCV 104-106   Anxiety    Asthma    Boil 01/05/2017   Cellulitis 05/20/2020   Chondromalacia of right knee 04/20/2012   Complex tear of medial meniscus of left knee as current injury 02/25/2016   Complication of anesthesia    hard to wake up   Constipation 11/17/2009   COPD (chronic obstructive pulmonary disease) (HCC)    chronic bronchitis   Derangement of anterior horn of lateral meniscus of left knee 02/25/2016   Duodenitis without mention of hemorrhage 01/01/2002   DVT (deep venous thrombosis) (HCC)    history clot lt groin when she had lymphoma-in remision now.   Elevated triglycerides with high cholesterol 01/05/2017   Erythema 09/01/2021   Gastroparesis    GERD (gastroesophageal reflux disease)    Hiatal hernia 01/01/2002   HIV infection (HCC)    CD4 = 570 (05/2010), VL undetectation   Hyperlipidemia    IBS (irritable bowel syndrome)    Migraines    on topamax  and triptans, frequent (almost daily) attacks    MRSA (methicillin resistant staph aureus) culture positive 12/20/2010   Non Hodgkin's lymphoma (HCC)    stage II, s/p resection, chemotherapy, radiotherapy, Dr. Isidor Marek is her oncologist.    PUD (peptic ulcer disease) 07/1997   per EGD report 07/1997 with history of esophagitis   Restless leg syndrome 12/24/2015   Shingles    in lumbar dermatome   Spondylolisthesis of lumbar region    L5-S1   TMJ (dislocation of temporomandibular joint)    Traumatic tear of lateral meniscus of right knee 04/20/2012   Umbilical bleeding 09/01/2021   Vaginal yeast infection 09/25/2017    Current Outpatient Medications on File Prior  to Visit  Medication Sig Dispense Refill   albuterol  (PROAIR  HFA) 108 (90 Base) MCG/ACT inhaler USE 1 TO 2 PUFFS EVERY 4-6 HOURS AS NEEDED FOR SHORTNESS OF BREATH ORWHEEZING 8.5 g 1   Atogepant  (QULIPTA ) 60 MG TABS Take 1 tablet (60 mg total) by mouth daily. 30 tablet 11   Beclomethasone Dipropionate  80 MCG/ACT AERS Place 2 Inhalers into the nose daily. 8.7 g 10   blood glucose meter kit and supplies Dispense based on patient and insurance preference. Use up to four times daily as directed. (FOR ICD-9 250.00, 250.01). 1 each 0   Boric Acid Vaginal 600 MG SUPP Place 600 mg vaginally at bedtime. 21 suppository 0   BOTOX  100 units SOLR injection INJECT UP TO 200 UNITS  INTRAMUSCULARLY EVERY 90 DAYS 2 each 3   Calcium  Carbonate-Vitamin D  (CALCIUM -VITAMIN D ) 500-200 MG-UNIT per tablet Take 2 tablets by mouth daily.      Continuous Blood Gluc Receiver (DEXCOM G7 RECEIVER) DEVI 1 each by Does not apply route daily. 1 each 0   Continuous Blood Gluc Sensor (DEXCOM G7 SENSOR) MISC 1 each by Does not apply route every 14 (fourteen) days. 9 each 3   Darunavir -Cobicistat -Emtricitabine -Tenofovir  Alafenamide (SYMTUZA ) 800-150-200-10 MG TABS Take 1 tablet by mouth daily with breakfast. 30 tablet 11   diclofenac  Sodium (VOLTAREN ) 1 % GEL APPLY TOPICALLY TO AFFECTED AREA 4 GRAMS 4 TIMES DAILY 100 g 2   FARXIGA  10  MG TABS tablet TAKE 1 TABLET BY MOUTH EVERY DAY 90 tablet 3   glucose blood test strip Use as instructed 100 each 12   GNP VITAMIN B-12 1000 MCG TBCR TAKE 1 TABLET BY MOUTH EVERY DAY 90 tablet 0   icosapent  Ethyl (VASCEPA ) 1 g capsule Take 2 capsules (2 g total) by mouth 2 (two) times daily. With meals 120 capsule 11   Insulin Aspart FlexPen (NOVOLOG) 100 UNIT/ML Inject into the skin.     Insulin Glargine (BASAGLAR KWIKPEN) 100 UNIT/ML SMARTSIG:30 Unit(s) SUB-Q Daily     lamoTRIgine  (LAMICTAL ) 100 MG tablet Take 1 tablet (100 mg total) by mouth 2 (two) times daily. 60 tablet 11   lamoTRIgine  (LAMICTAL ) 25  MG tablet 1 twice a day x1wk 2 twice a day x 2nd wk 3 twice a day x3rd wk 84 tablet 0   losartan (COZAAR) 50 MG tablet Take 50 mg by mouth daily.     mometasone -formoterol  (DULERA ) 100-5 MCG/ACT AERO USE 2 PUFFS BY MOUTH THE FIRST THING IN THE MORNING AND THEN ANOTHER 2 PUFFS ABOUT 12 HOURS LATER 13 g 5   MOUNJARO 2.5 MG/0.5ML Pen Inject 2.5 mg into the skin once a week.     pantoprazole  (PROTONIX ) 40 MG tablet TAKE 1 TABLET BY MOUTH 2 TIMES A DAY FORACID REFLUX 60 tablet 4   Plecanatide  (TRULANCE ) 3 MG TABS Take 1 tablet by mouth daily. 30 tablet 0   propranolol  (INDERAL ) 20 MG tablet TAKE 1 TABLET BY MOUTH TWICE DAILY 180 tablet 1   rosuvastatin  (CRESTOR ) 20 MG tablet Take 20 mg by mouth daily.     Tamsulosin HCl (FLOMAX) 0.4 MG CAPS Take 0.4 mg by mouth daily. 1-2 daily     tiZANidine  (ZANAFLEX ) 4 MG tablet Take 1 tablet (4 mg total) by mouth every 8 (eight) hours as needed for muscle spasms. 90 tablet 11   zolmitriptan  (ZOMIG ) 5 MG tablet TAKE 1 TABLET BY MOUTH EVERY DAY AS NEEDED MIGRAINE 12 tablet 11   zolpidem  (AMBIEN ) 10 MG tablet TAKE 1 TABLET BY MOUTH AT BEDTIME AS NEEDED FOR SLEEP (TRY NOT TO TAKE NIGHTLY) 30 tablet 3   No current facility-administered medications on file prior to visit.    Family History  Problem Relation Age of Onset   Hypertension Mother    Stroke Mother    Heart disease Mother    Diabetes Mother    Colon polyps Mother    Breast cancer Paternal Grandmother    Kidney cancer Maternal Uncle    Heart disease Maternal Uncle    Diabetes Maternal Uncle    Diabetes Maternal Grandmother    Heart disease Maternal Grandmother    Emphysema Paternal Uncle        never smoker   Emphysema Paternal Grandfather        never smoker   Colon cancer Paternal Aunt        with met to lung    Non-Hodgkin's lymphoma Maternal Uncle    Heart disease Maternal Grandfather     Social History   Socioeconomic History   Marital status: Divorced    Spouse name: Not on file    Number of children: 0   Years of education: 12   Highest education level: Not on file  Occupational History   Occupation: disabled    Employer: UNEMPLOYED  Tobacco Use   Smoking status: Never   Smokeless tobacco: Never  Vaping Use   Vaping status: Never Used  Substance and Sexual Activity  Alcohol use: No    Alcohol/week: 0.0 standard drinks of alcohol   Drug use: No   Sexual activity: Not Currently    Partners: Male    Birth control/protection: Surgical  Other Topics Concern   Not on file  Social History Narrative      Patient is disabled.   Right handed.   Caffeine - Diet soda., tea   Education - high school   Social Drivers of Corporate investment banker Strain: Not on file  Food Insecurity: Low Risk  (09/05/2023)   Received from [facility]   Hunger Vital Sign    Worried About Running Out of Food in the Last Year: Never true    Ran Out of Food in the Last Year: Never true  Transportation Needs: No Transportation Needs (09/05/2023)   Received from [facility]    In the past 12 months, has lack of reliable transportation kept you from medical appointments, meetings, work or from getting things needed for daily living? : No  Physical Activity: Not on file  Stress: Not on file  Social Connections: Not on file  Intimate Partner Violence: Not on file    Review of Systems: ROS negative except for what is noted on the assessment and plan.  Vitals:   10/25/23 1451  BP: (!) 138/58  Pulse: 74  SpO2: 97%  Weight: (!) 304 lb 12.8 oz (138.3 kg)   Physical Exam: Constitutional: alert, sitting in chair, in no acute distress Cardiovascular: regular rate and rhythm Pulmonary/Chest: normal work of breathing on room air Neurological: alert & oriented x 3 Psych: normal affect   Assessment & Plan:   Hypertension BP mildly elevated 138/58. Currently not on any BP meds. Denies hypertensive symptoms. Was on ARB but stopped due to hair loss. Stopped  lisinopril  due to concern for cough.    Plan -Reassess BP at next visit -If consistently elevated may need to restart antihypertensive   Human immunodeficiency virus (HIV) disease Follows with ID with next appt on 03/2024. Doing well on Symtuza .   Dyslipidemia F/u visit after starting Vascepa  in setting of hypertriglyceridemia. Last lipid panel on 2/27 with TGC 596 and LDL 85. Currently taking Crestor  20 mg daily and Vascepa  2 g BID. Not eaten today, will get fasting lipid panel.   Plan -Lipid panel today -Continue Crestor  and Vascepa    Depression PHQ-9 of 10 today. Was started on venlafaxine  37.5 mg daily in February. States has helped with her symptoms and denies any significant side effects. Will titrate up today.   Plan -Increase venlafaxine  to 75 mg daily (new Rx sent)   Patient discussed with Dr. Nelson Bandy, D.O. [facility], PGY-2 Phone: [phone] Date 10/25/2023 Time 7:24 PM

## 2023-10-25 NOTE — Assessment & Plan Note (Addendum)
 PHQ-9 of 10 today. Was started on venlafaxine  37.5 mg daily in February. States has helped with her symptoms and denies any significant side effects. Will titrate up today.   Plan -Increase venlafaxine  to 75 mg daily (new Rx sent)

## 2023-10-25 NOTE — Patient Instructions (Addendum)
 Thank you, [name] for allowing us  to provide your care today. Today we discussed   -Increase venlafaxine  (Effexor ) to 75 mg once a day for mood and nerve pain -Continue seeing your endocrinologist and neurologist -Referral for urology sent to continue seeing Dr Inga Manges -Blood work today to check cholesterol  -Continue taking Vascepa  and Crestor      I have ordered the following medication/changed the following medications:  Stop the following medications: Venlafaxine  37.5 mg daily   Start the following medications: Meds ordered this encounter  Medications   venlafaxine  XR (EFFEXOR  XR) 75 MG 24 hr capsule    Sig: Take 1 capsule (75 mg total) by mouth daily.    Dispense:  30 capsule    Refill:  2     Follow up: 3 months   Should you have any questions or concerns please call the internal medicine clinic at [phone].    [name], D.O. [facility]

## 2023-10-26 LAB — LIPID PANEL
Chol/HDL Ratio: 4.8 ratio — ABNORMAL HIGH (ref 0.0–4.4)
Cholesterol, Total: 188 mg/dL (ref 100–199)
HDL: 39 mg/dL — ABNORMAL LOW (ref >39)
LDL Chol Calc (NIH): 90 mg/dL (ref 0–99)
Triglycerides: 356 mg/dL — ABNORMAL HIGH (ref 0–149)
VLDL Cholesterol Cal: 59 mg/dL — ABNORMAL HIGH (ref 5–40)

## 2023-10-31 ENCOUNTER — Encounter

## 2023-11-03 NOTE — Progress Notes (Signed)
 Internal Medicine Clinic Attending  Case discussed with the resident at the time of the visit.  We reviewed the resident's history and exam and pertinent patient test results.  I agree with the assessment, diagnosis, and plan of care documented in the resident's note.

## 2023-11-14 ENCOUNTER — Telehealth: Payer: Self-pay | Admitting: Neurology

## 2023-11-14 NOTE — Telephone Encounter (Signed)
 Pt called to confirm appt details.  Appt Details Confirm

## 2023-11-15 ENCOUNTER — Telehealth: Payer: Self-pay | Admitting: Neurology

## 2023-11-15 ENCOUNTER — Ambulatory Visit: Payer: Medicaid Other | Admitting: Neurology

## 2023-11-15 VITALS — BP 119/69

## 2023-11-15 DIAGNOSIS — G43711 Chronic migraine without aura, intractable, with status migrainosus: Secondary | ICD-10-CM

## 2023-11-15 DIAGNOSIS — G43719 Chronic migraine without aura, intractable, without status migrainosus: Secondary | ICD-10-CM | POA: Diagnosis not present

## 2023-11-15 MED ORDER — ONABOTULINUMTOXINA 100 UNITS IJ SOLR
155.0000 [IU] | Freq: Once | INTRAMUSCULAR | Status: AC
  Administered 2023-11-15: 155 [IU] via INTRAMUSCULAR

## 2023-11-15 NOTE — Telephone Encounter (Signed)
 Patient reports she had to cancel HST twice, she has been calling to try and reschedule and hasn't had any luck. Can we help with this?

## 2023-11-15 NOTE — Progress Notes (Signed)
   BOTOX  PROCEDURE NOTE FOR MIGRAINE HEADACHE  HISTORY: [name] is here for Botox .  Last was 08/10/2023 with me.  Saw Dr. Omar Bibber for sleep consult, sleep study was ordered. Claims had to change HST twice, waiting for call back to schedule. Remains on Qulipta , has helped. Daily headaches, 2 migraines a week. Botox  + Qulipta  less frequent migraine/less severe. No longer staying in bed for days and hugging the toilet. She continues with whole body muscle jerks for 8 years, usually about once a month, whole body will jerk all day long, no alteration in consciousness, worse when laying down in bed. Work up with MRI and EEG were normal.   Description of procedure:  The patient was placed in a sitting position. The standard protocol was used for Botox  as follows, with 5 units of Botox  injected at each site:  -Procerus muscle, midline injection  -Corrugator muscle, bilateral injection  -Frontalis muscle, bilateral injection, with 2 sites each side, medial injection was performed in the upper one third of the frontalis muscle, in the region vertical from the medial inferior edge of the superior orbital rim. The lateral injection was again in the upper one third of the forehead vertically above the lateral limbus of the cornea, 1.5 cm lateral to the medial injection site.  -Temporalis muscle injection, 4 sites, bilaterally. The first injection was 3 cm above the tragus of the ear, second injection site was 1.5 cm to 3 cm up from the first injection site in line with the tragus of the ear. The third injection site was 1.5-3 cm forward between the first 2 injection sites. The fourth injection site was 1.5 cm posterior to the second injection site.  -Occipitalis muscle injection, 3 sites, bilaterally. The first injection was done one half way between the occipital protuberance and the tip of the mastoid process behind the ear. The second injection site was done lateral and superior to the first, 1  fingerbreadth from the first injection. The third injection site was 1 fingerbreadth superiorly and medially from the first injection site.  -Cervical paraspinal muscle injection, 2 sites, bilateral, the first injection site was 1 cm from the midline of the cervical spine, 3 cm inferior to the lower border of the occipital protuberance. The second injection site was 1.5 cm superiorly and laterally to the first injection site.  -Trapezius muscle injection was performed at 3 sites, bilaterally. The first injection site was in the upper trapezius muscle halfway between the inflection point of the neck, and the acromion. The second injection site was one half way between the acromion and the first injection site. The third injection was done between the first injection site and the inflection point of the neck.  A 200 unit bottle of Botox  was used, 155 units were injected, the rest of the Botox  was wasted. The patient tolerated the procedure well, there were no complications of the above procedure.  Botox  NDC [id number] Lot number [id number] Expiration date 04/2026 SP  Advised to proceed with HST. Continue Qulipta  + Botox  for migraine prevention. Advised her to take a video of episode of shaking and send on my chart.

## 2023-11-15 NOTE — Progress Notes (Signed)
 Botox - 100 units x 2 vials Lot: [id number] Expiration: 2027/10 NDC: 2725-3664-40  Bacteriostatic 0.9% Sodium Chloride - 4 mL  Lot: [id number] Expiration: 05/04/24 NDC: 9563875643  Dx:  P29.518  S/P  Witnessed by Laird Pih CMA

## 2023-11-20 DIAGNOSIS — K023 Arrested dental caries: Secondary | ICD-10-CM | POA: Diagnosis not present

## 2023-11-20 NOTE — Telephone Encounter (Signed)
 I called patient to reschedule her home sleep study. Patient did not answer so I left a VM to call back.

## 2023-12-01 NOTE — Progress Notes (Signed)
 The 10-year ASCVD risk score (Arnett DK, et al., 2019) is: 6.7%   Values used to calculate the score:     Age: 59 years     Sex: Female     Is Non-Hispanic African American: No     Diabetic: Yes     Tobacco smoker: No     Systolic Blood Pressure: 124 mmHg     Is BP treated: Yes     HDL Cholesterol: 39 mg/dL     Total Cholesterol: 188 mg/dL  Currently prescribed rosuvastatin  20 mg.  Avantika Shere, BSN, RN

## 2023-12-04 ENCOUNTER — Telehealth: Payer: Self-pay | Admitting: Neurology

## 2023-12-04 NOTE — Telephone Encounter (Signed)
Patient called to verify appointment time °

## 2023-12-06 DIAGNOSIS — N761 Subacute and chronic vaginitis: Secondary | ICD-10-CM | POA: Diagnosis not present

## 2023-12-06 DIAGNOSIS — R3914 Feeling of incomplete bladder emptying: Secondary | ICD-10-CM | POA: Diagnosis not present

## 2023-12-06 DIAGNOSIS — N312 Flaccid neuropathic bladder, not elsewhere classified: Secondary | ICD-10-CM | POA: Diagnosis not present

## 2023-12-06 DIAGNOSIS — R3911 Hesitancy of micturition: Secondary | ICD-10-CM | POA: Diagnosis not present

## 2023-12-26 ENCOUNTER — Ambulatory Visit (INDEPENDENT_AMBULATORY_CARE_PROVIDER_SITE_OTHER): Admitting: Neurology

## 2023-12-26 DIAGNOSIS — R0683 Snoring: Secondary | ICD-10-CM

## 2023-12-26 DIAGNOSIS — R635 Abnormal weight gain: Secondary | ICD-10-CM

## 2023-12-26 DIAGNOSIS — R0681 Apnea, not elsewhere classified: Secondary | ICD-10-CM

## 2023-12-26 DIAGNOSIS — G4733 Obstructive sleep apnea (adult) (pediatric): Secondary | ICD-10-CM | POA: Diagnosis not present

## 2023-12-26 DIAGNOSIS — G4734 Idiopathic sleep related nonobstructive alveolar hypoventilation: Secondary | ICD-10-CM

## 2023-12-26 DIAGNOSIS — Z9189 Other specified personal risk factors, not elsewhere classified: Secondary | ICD-10-CM

## 2023-12-26 DIAGNOSIS — R519 Headache, unspecified: Secondary | ICD-10-CM

## 2023-12-26 DIAGNOSIS — G4700 Insomnia, unspecified: Secondary | ICD-10-CM

## 2023-12-26 DIAGNOSIS — R351 Nocturia: Secondary | ICD-10-CM

## 2023-12-27 ENCOUNTER — Ambulatory Visit: Payer: Self-pay | Admitting: Neurology

## 2023-12-27 DIAGNOSIS — G4734 Idiopathic sleep related nonobstructive alveolar hypoventilation: Secondary | ICD-10-CM

## 2023-12-27 DIAGNOSIS — G4733 Obstructive sleep apnea (adult) (pediatric): Secondary | ICD-10-CM

## 2023-12-27 NOTE — Progress Notes (Signed)
 See procedure note.

## 2023-12-27 NOTE — Procedures (Signed)
 [facility]  HOME SLEEP TEST (Watch PAT) REPORT  STUDY DATE: 12/26/23  DOB: [birth year]  MRN: [id number]  ORDERING CLINICIAN: True Mar, MD, PhD   REFERRING CLINICIAN: Lauraine Born, NP  CLINICAL INFORMATION/HISTORY: 58 year old female with an underlying complex medical history of DVT, non-Hodgkin's lymphoma, status post chemotherapy and radiation therapy, COPD, history of cellulitis, asthma, anxiety, reflux disease and peptic ulcer disease, hiatal hernia, gastroparesis, HIV disease, hyperlipidemia, irritable bowel syndrome, migraine headaches, TMJ dysfunction, anemia, restless leg syndrome, history of shingles, and morbid obesity with a BMI of over 50, who reports snoring and excessive daytime somnolence.    Epworth sleepiness score: 3/24.  BMI: 50 kg/m  FINDINGS:   Sleep Summary:   Total Recording Time (hours, min): 8 hours, 33 min  Total Sleep Time (hours, min):  8 hours, 11 min  Percent REM (%):    32.5%   Respiratory Indices:   Calculated pAHI (per hour):  22.8/hour         REM pAHI:    44.8/hour       NREM pAHI: 12.2/hour  Central pAHI: 1.5/hour  Oxygen Saturation Statistics:    Oxygen Saturation (%) Mean: 85%   Minimum oxygen saturation (%):                 74%   O2 Saturation Range (%): 74 - 95%    O2 Saturation (minutes) <=88%: 413.4 min  Pulse Rate Statistics:   Pulse Mean (bpm):    88/min    Pulse Range (65 - 115/min)   IMPRESSION:   OSA (obstructive sleep apnea), moderate 2.   Nocturnal Hypoxemia  RECOMMENDATION:  This home sleep test demonstrates moderate to severe obstructive sleep apnea with a total AHI of 22.8/hour and O2 nadir of 74% with significant time below or at 88% saturation of over over 400 minutes for the study, indicating nocturnal hypoxemia. Snoring was detected, in the moderate to loud range. Urgent treatment with positive airway pressure is highly recommended. The patient will be advised to proceed with an  autoPAP titration/trial at home. A laboratory attended titration study can be considered in the future for optimization of treatment settings and to improve tolerance and compliance, if needed, down the road. Alternative treatment options are limited secondary to the severity of the patient's sleep disordered breathing, but may include surgical treatment with an implantable hypoglossal nerve stimulator (in carefully selected candidates, meeting criteria).  Concomitant weight loss is highly recommended (where clinically appropriate). Please note, that untreated obstructive sleep apnea may carry additional perioperative morbidity. Patients with significant obstructive sleep apnea should receive perioperative PAP therapy and the surgeons and particularly the anesthesiologist should be informed of the diagnosis and the severity of the sleep disordered breathing. The patient should be cautioned not to drive, work at heights, or operate dangerous or heavy equipment when tired or sleepy. Review and reiteration of good sleep hygiene measures should be pursued with any patient. Other causes of the patient's symptoms, including circadian rhythm disturbances, an underlying mood disorder, medication effect and/or an underlying medical problem cannot be ruled out based on this test. Clinical correlation is recommended.  The patient and her referring provider will be notified of the test results. The patient will be seen in follow up in sleep clinic at [facility].  I certify that I have reviewed the raw data recording prior to the issuance of this report in accordance with the standards of the American Academy of Sleep Medicine (AASM).    INTERPRETING PHYSICIAN:  True Mar, MD, PhD Medical Director, [facility]) Diplomat, ABPN (Neurology and Sleep)   [facility] [address] [place], [place] [address] [phone]

## 2024-01-01 NOTE — Telephone Encounter (Signed)
 I relayed sleep results to pt.  Moderate to severe OSA with severe oxygen desaturations. Dr. Buck recommends treatment in form of autopap.  DME [facility] [employer] in [place], [place].  She understands to use machine 4 hr minimum but recommend using all night when sleeping (7-8hrs).  Will see back for insurance compliance appt in 2-3 months. 04-01-2024 at 1245 with JM/NP.  She verbalized understanding, I gave her [employer] # to call.  She realizes she is urgent set up.  Faxed to [employer] [phone]

## 2024-01-01 NOTE — Telephone Encounter (Signed)
-----   Message from True Mar sent at 12/27/2023  5:21 PM EDT ----- Urgent set up requested on PAP therapy, due to severe desaturations during HST and significant OSA. Patient referred by [facility], seen by me on 09/20/23, patient had a HST on 12/26/23.    Please call and notify the patient that the recent home sleep test showed obstructive sleep apnea in the moderate to severe range with severe oxygen desaturations. I recommend treatment for this in  the form of autoPAP, which means, that we don't have to bring her in for a sleep study with CPAP, but will let her start using a so called autoPAP machine at home, through a DME company (of her  choice, or as per insurance requirement). The DME representative will fit the patient with a mask of choice, educate her on how to use the machine, how to put the mask on, etc. I have placed an order  in the chart. Please send the order to a local DME, talk to patient, send report to referring MD. Please also reinforce the need for compliance with treatment. We will need a FU in sleep clinic for  10 weeks post-PAP set up, please arrange that with me or one of our NPs.   I also recommend, working aggressively on weight loss.   Thanks,   True Mar, MD, PhD [facility])    ----- Message ----- From: Mar True, MD Sent: 12/27/2023   5:18 PM EDT To: True Mar, MD

## 2024-01-02 DIAGNOSIS — K029 Dental caries, unspecified: Secondary | ICD-10-CM | POA: Diagnosis not present

## 2024-01-10 ENCOUNTER — Telehealth: Payer: Self-pay | Admitting: Neurology

## 2024-01-10 NOTE — Telephone Encounter (Signed)
 Received approval, pt will continue to fill through [facility].  Auth#: [id number] (01/10/24-01/09/25)

## 2024-01-10 NOTE — Telephone Encounter (Signed)
 Completed auth request via CMM, status is pending. Key: [id number]

## 2024-01-17 ENCOUNTER — Other Ambulatory Visit: Payer: Self-pay | Admitting: Student

## 2024-01-17 DIAGNOSIS — G4700 Insomnia, unspecified: Secondary | ICD-10-CM

## 2024-01-22 ENCOUNTER — Other Ambulatory Visit: Payer: Self-pay | Admitting: Student

## 2024-01-23 ENCOUNTER — Telehealth: Payer: Self-pay | Admitting: Neurology

## 2024-01-23 NOTE — Telephone Encounter (Signed)
 Pt states she hasn't received a call about picking up her CPAP machine.

## 2024-01-23 NOTE — Telephone Encounter (Signed)
 Spoke to patient gave her  [employer] # to call to Inquire on cpap machine pick up

## 2024-02-07 ENCOUNTER — Ambulatory Visit: Admitting: Neurology

## 2024-02-07 ENCOUNTER — Telehealth: Payer: Self-pay | Admitting: Neurology

## 2024-02-07 NOTE — Telephone Encounter (Signed)
 Pt called to reschedule appt  due to trimmers  Appt schedule

## 2024-03-05 ENCOUNTER — Encounter: Payer: Self-pay | Admitting: Infectious Disease

## 2024-03-05 DIAGNOSIS — G4733 Obstructive sleep apnea (adult) (pediatric): Secondary | ICD-10-CM | POA: Insufficient documentation

## 2024-03-06 ENCOUNTER — Ambulatory Visit: Payer: Medicaid Other | Admitting: Infectious Disease

## 2024-03-06 DIAGNOSIS — B20 Human immunodeficiency virus [HIV] disease: Secondary | ICD-10-CM

## 2024-03-06 DIAGNOSIS — G4733 Obstructive sleep apnea (adult) (pediatric): Secondary | ICD-10-CM

## 2024-03-06 DIAGNOSIS — I10 Essential (primary) hypertension: Secondary | ICD-10-CM

## 2024-03-06 DIAGNOSIS — E785 Hyperlipidemia, unspecified: Secondary | ICD-10-CM

## 2024-03-06 DIAGNOSIS — E1142 Type 2 diabetes mellitus with diabetic polyneuropathy: Secondary | ICD-10-CM

## 2024-03-13 ENCOUNTER — Ambulatory Visit: Admitting: Neurology

## 2024-03-13 ENCOUNTER — Telehealth: Payer: Self-pay | Admitting: Neurology

## 2024-03-13 VITALS — BP 142/85 | HR 67

## 2024-03-13 DIAGNOSIS — G43711 Chronic migraine without aura, intractable, with status migrainosus: Secondary | ICD-10-CM | POA: Diagnosis not present

## 2024-03-13 DIAGNOSIS — G43719 Chronic migraine without aura, intractable, without status migrainosus: Secondary | ICD-10-CM | POA: Diagnosis not present

## 2024-03-13 DIAGNOSIS — R519 Headache, unspecified: Secondary | ICD-10-CM

## 2024-03-13 MED ORDER — LAMOTRIGINE 100 MG PO TABS: 100.0000 mg | ORAL_TABLET | Freq: Two times a day (BID) | ORAL | 3 refills | Status: DC

## 2024-03-13 MED ORDER — ONABOTULINUMTOXINA 100 UNITS IJ SOLR: 200.0000 [IU] | Freq: Once | INTRAMUSCULAR | Status: AC

## 2024-03-13 NOTE — Progress Notes (Deleted)
 Botox - 100 units x 2 vials Lot:  Expiration: NDC:    Bacteriostatic 0.9% Sodium Chloride - 4 mL  Lot:  Expiration:  NDC:    Dx:  G43.719  S/P  Witnessed by: Nevelyn FALCON

## 2024-03-13 NOTE — Telephone Encounter (Signed)
 Order 72 hours ambulatory EEG  [name], would you check on her sleep equipment, she has not got it yet

## 2024-03-13 NOTE — Progress Notes (Signed)
 Botox - 100 units x 2 vials Lot: [id number] Expiration: 2027/10 NDC: 9976-8854-98  Bacteriostatic 0.9% Sodium Chloride - 4 mL  Lot: [id number] Expiration: 05/03/25 NDC: 9590803397  Dx:  H56.288    G43.719   S/P  Witnessed by DIAMOND CMA

## 2024-03-13 NOTE — Progress Notes (Signed)
 Chief Complaint  Patient presents with   Procedure    RM 14 with mother  Pt is well       ASSESSMENT AND PLAN  [name] is a 58 y.o. female   History of HIV, under good control, CD4 was within normal limit, HIV was not detectable by PCR   History of non-Hodgkin's lymphoma, completed with chemo therapy Obesity, diabetes poorly controlled, A1c was 10.5 in  Feb 2025, evidence of mild diabetic peripheral neuropathy  Depression anxiety, polypharmacy   Chronic migraine headaches        She complains of frequent almost daily headache, will stop CGRP injection, try Qulipta  60 mg daily, continue Botox  injection every 3 months, zomig  prn  Intermittent body tremor   MRI of the brain with without contrast was normal in Jan 2025  EEG was normal in Feb 2025  72 hours video EEG monitoring  Encouraged her to continue to work with her primary care and psychiatrist, hope better control of her depression anxiety,   Obstructive sleep apnea  In the process of getting CPAP     DIAGNOSTIC DATA (LABS, IMAGING, TESTING) - I reviewed patient records, labs, notes, testing and imaging myself where available.   MEDICAL HISTORY:  [name], is a 58 year old female, accompanied by her mother to follow-up for chronic migraine, worsening tremor  History is obtained from the patient and review of electronic medical records. I personally reviewed pertinent available imaging films in PACS.   PMHx of  Chronic bronchitis DM-insulin dependent GERD HLD Obesity HIV Chronic migraine Depression anxiety Non-Hodgkin's lymphoma in 2004, chemo, radiation, slow decline in functional status over the past many years due to obesity, sedentary lifestyle, chronic migraine, polypharmacy treatment  Reported history of migraines since age 44, progressive to her daily headaches, on polypharmacy for migraine prevention, her migraine often proceeding with visual aura, severe pounding headache, with blurry  vision, light noise sensitivity, nauseous, her headache can last for days,  Trigger for her migraines are MSG, stress, weather change, sleep deprivation,  Tried and failed: Atenolol, verapamil , nortriptyline, Inderal , Depakote, Zonegran,Topamax , either not effective or could not tolerate the side effect  For abortive treatment, tried and failed, Imitrex , Maxalt, Zomig  as needed seems to work somewhat  She began to receive Botox  injection as migraine prevention since May 2013, every 3 months, with moderate help  MRI of the brain was normal in 2013  MRI lumbar in February 2020, chronic degenerative changes, L5-S1, L5 pars fracture, advanced the disc and moderate posterior element degeneration, no significant spinal lateral recess stenosis, but moderate to severe chronic bilateral L5 foraminal stenosis,   She was started on CGRP antagonist around 2022, along with Botox  as migraine prevention, Emgality  initially, now on aimovig , did help her headache,  Today she also complains about few years history of intermittent body tremor, whole body shaking can last for hours to days, without loss of consciousness, mother reported initial episode was in 2016 when she was under extreme stress, she was driving, while her father was at that hospital dying  She had sudden onset of body tremor, since then, she had intermittent episode, getting worse over the past couple years, she reported a lot of stress, her sister who usually accompanies her a doctor's appointment suffered fall, bony injury required multiple surgeries  She also complains of worsening depression anxiety, tried different medications without helping,  Poorly controlled diabetes, A1c was 11 in August 2024, bilateral foot paresthesia, suggestive of diabetic peripheral neuropathy  Triglyceride up to 717 in 2022, some improvement to 561  in August 2024, HIV was not detected  UPDATE Sept 10th 2025: She is accompanied by her mother at today's  visit, complains of stress, not sleeping well, despite polypharmacy lamotrigine  100 mg twice a day, Effexor  75 mg daily Inderal  20 mg twice a day, depression is not under good control, she also reported slow Worsening body shaking episode, most recent episode lasted for 1 day, whole body tremor, could not sleep, could not eat,  MRI of the brain with without contrast August 01, 2023 was normal  EEG January 2025 was normal  TSH was normal in February 2025,  Home sleep study June 2024 confirmed moderate to severe obstructive sleep apnea, in the process of getting equipment  Continue to have frequent headaches 3 to 4 days a week, despite Qulipta  every day, using frequent Zomig   PHYSICAL EXAM:   Vitals:   03/13/24 1426  BP: (!) 142/85  Pulse: 67       PHYSICAL EXAMNIATION:  Gen: NAD, conversant, well nourised, well groomed                     Cardiovascular: Regular rate rhythm, no peripheral edema, warm, nontender. Eyes: Conjunctivae clear without exudates or hemorrhage Neck: Supple, no carotid bruits. Pulmonary: Clear to auscultation bilaterally   NEUROLOGICAL EXAM:  MENTAL STATUS: Speech/cognition: Depressed looking obese female awake, alert, oriented to history taking and casual conversation CRANIAL NERVES: CN II: Visual fields are full to confrontation. Pupils are round equal and briskly reactive to light. CN III, IV, VI: extraocular movement are normal. No ptosis. CN V: Facial sensation is intact to light touch CN VII: Face is symmetric with normal eye closure  CN VIII: Hearing is normal to causal conversation. CN IX, X: Phonation is normal. CN XI: Head turning and shoulder shrug are intact  MOTOR: There is no pronator drift of out-stretched arms. Muscle bulk and tone are normal. Muscle strength is normal.  REFLEXES: Reflexes are 2+ and symmetric at the biceps, triceps, knees, and ankles. Plantar responses are flexor.  SENSORY: Mildly length-dependent decreased  light touch pinprick  COORDINATION: There is no trunk or limb dysmetria noted.  GAIT/STANCE: Push-up to get up from seated position limited by her big body habitus, steady  REVIEW OF SYSTEMS:  Full 14 system review of systems performed and notable only for as above All other review of systems were negative.   ALLERGIES: Allergies  Allergen Reactions   Divalproex Sodium Nausea And Vomiting    Causes light-headedness   Gabapentin Nausea And Vomiting    confusion   Ondansetron  Hcl Nausea And Vomiting    Triggers migraines, makes me vomit   Pineapple    Propoxyphene N-Acetaminophen  Nausea And Vomiting   Simvastatin      PATIENT CANNOT BE PRESCRIBED THIS STATIN WHILE RECEIVING NORVIR    Zocor  [Simvastatin  - High Dose]    Penicillins     REACTION: lips swollen to point of bleeding, sever vaginal irritation  Reports tolerating augmentin  well without any complaints or reaction    HOME MEDICATIONS: Current Outpatient Medications  Medication Sig Dispense Refill   albuterol  (PROAIR  HFA) 108 (90 Base) MCG/ACT inhaler USE 1 TO 2 PUFFS EVERY 4-6 HOURS AS NEEDED FOR SHORTNESS OF BREATH ORWHEEZING 8.5 g 1   Atogepant  (QULIPTA ) 60 MG TABS Take 1 tablet (60 mg total) by mouth daily. 30 tablet 11   Beclomethasone Dipropionate  80 MCG/ACT AERS Place 2 Inhalers into the nose daily.  8.7 g 10   blood glucose meter kit and supplies Dispense based on patient and insurance preference. Use up to four times daily as directed. (FOR ICD-9 250.00, 250.01). 1 each 0   Boric Acid Vaginal 600 MG SUPP Place 600 mg vaginally at bedtime. 21 suppository 0   BOTOX  100 units SOLR injection INJECT UP TO 200 UNITS  INTRAMUSCULARLY EVERY 90 DAYS 2 each 3   Calcium  Carbonate-Vitamin D  (CALCIUM -VITAMIN D ) 500-200 MG-UNIT per tablet Take 2 tablets by mouth daily.      Continuous Blood Gluc Receiver (DEXCOM G7 RECEIVER) DEVI 1 each by Does not apply route daily. 1 each 0   Continuous Blood Gluc Sensor (DEXCOM G7 SENSOR)  MISC 1 each by Does not apply route every 14 (fourteen) days. 9 each 3   Darunavir -Cobicistat -Emtricitabine -Tenofovir  Alafenamide (SYMTUZA ) 800-150-200-10 MG TABS Take 1 tablet by mouth daily with breakfast. 30 tablet 11   diclofenac  Sodium (VOLTAREN ) 1 % GEL APPLY TOPICALLY TO AFFECTED AREA 4 GRAMS 4 TIMES DAILY 100 g 2   FARXIGA  10 MG TABS tablet TAKE 1 TABLET BY MOUTH EVERY DAY 90 tablet 3   glucose blood test strip Use as instructed 100 each 12   GNP VITAMIN B-12 1000 MCG TBCR TAKE 1 TABLET BY MOUTH EVERY DAY 90 tablet 0   icosapent  Ethyl (VASCEPA ) 1 g capsule Take 2 capsules (2 g total) by mouth 2 (two) times daily. With meals 120 capsule 11   Insulin Aspart FlexPen (NOVOLOG) 100 UNIT/ML Inject into the skin.     Insulin Glargine (BASAGLAR KWIKPEN) 100 UNIT/ML SMARTSIG:30 Unit(s) SUB-Q Daily     lamoTRIgine  (LAMICTAL ) 100 MG tablet Take 1 tablet (100 mg total) by mouth 2 (two) times daily. 60 tablet 11   lamoTRIgine  (LAMICTAL ) 25 MG tablet 1 twice a day x1wk 2 twice a day x 2nd wk 3 twice a day x3rd wk 84 tablet 0   losartan (COZAAR) 50 MG tablet Take 50 mg by mouth daily.     mometasone -formoterol  (DULERA ) 100-5 MCG/ACT AERO USE 2 PUFFS BY MOUTH THE FIRST THING IN THE MORNING AND THEN ANOTHER 2 PUFFS ABOUT 12 HOURS LATER 13 g 5   MOUNJARO 2.5 MG/0.5ML Pen Inject 2.5 mg into the skin once a week.     pantoprazole  (PROTONIX ) 40 MG tablet TAKE 1 TABLET BY MOUTH 2 TIMES A DAY FORACID REFLUX 60 tablet 4   Plecanatide  (TRULANCE ) 3 MG TABS Take 1 tablet by mouth daily. 30 tablet 0   propranolol  (INDERAL ) 20 MG tablet TAKE 1 TABLET BY MOUTH TWICE DAILY 180 tablet 1   rosuvastatin  (CRESTOR ) 20 MG tablet Take 20 mg by mouth daily.     Tamsulosin HCl (FLOMAX) 0.4 MG CAPS Take 0.4 mg by mouth daily. 1-2 daily     tiZANidine  (ZANAFLEX ) 4 MG tablet Take 1 tablet (4 mg total) by mouth every 8 (eight) hours as needed for muscle spasms. 90 tablet 11   venlafaxine  XR (EFFEXOR -XR) 75 MG 24 hr capsule TAKE 1  CAPSULE BY MOUTH DAILY 30 capsule 2   zolmitriptan  (ZOMIG ) 5 MG tablet TAKE 1 TABLET BY MOUTH EVERY DAY AS NEEDED MIGRAINE 12 tablet 11   zolpidem  (AMBIEN ) 10 MG tablet TAKE 1 TABLET BY MOUTH AT BEDTIME AS NEEDED FOR SLEEP (TRY NOT TO TAKE NIGHTLY) 30 tablet 3   Current Facility-Administered Medications  Medication Dose Route Frequency Provider Last Rate Last Admin   botulinum toxin Type A  (BOTOX ) injection 200 Units  200 Units Intramuscular Once Lilliah Priego,  MD        PAST MEDICAL HISTORY: Past Medical History:  Diagnosis Date   Acute gastritis without mention of hemorrhage 01/01/2002   Anemia 10/2009   macrocytic anemia with baseline MCV 104-106   Anxiety    Asthma    Boil 01/05/2017   Cellulitis 05/20/2020   Chondromalacia of right knee 04/20/2012   Complex tear of medial meniscus of left knee as current injury 02/25/2016   Complication of anesthesia    hard to wake up   Constipation 11/17/2009   COPD (chronic obstructive pulmonary disease) (HCC)    chronic bronchitis   Derangement of anterior horn of lateral meniscus of left knee 02/25/2016   Duodenitis without mention of hemorrhage 01/01/2002   DVT (deep venous thrombosis) (HCC)    history clot lt groin when she had lymphoma-in remision now.   Elevated triglycerides with high cholesterol 01/05/2017   Erythema 09/01/2021   Gastroparesis    GERD (gastroesophageal reflux disease)    Hiatal hernia 01/01/2002   HIV infection (HCC)    CD4 = 570 (05/2010), VL undetectation   Hyperlipidemia    IBS (irritable bowel syndrome)    Migraines    on topamax  and triptans, frequent (almost daily) attacks    MRSA (methicillin resistant staph aureus) culture positive 12/20/2010   Non Hodgkin's lymphoma (HCC)    stage II, s/p resection, chemotherapy, radiotherapy, Dr. Freddie is her oncologist.    OSA (obstructive sleep apnea) 03/05/2024   PUD (peptic ulcer disease) 07/1997   per EGD report 07/1997 with history of esophagitis    Restless leg syndrome 12/24/2015   Shingles    in lumbar dermatome   Spondylolisthesis of lumbar region    L5-S1   TMJ (dislocation of temporomandibular joint)    Traumatic tear of lateral meniscus of right knee 04/20/2012   Umbilical bleeding 09/01/2021   Vaginal yeast infection 09/25/2017    PAST SURGICAL HISTORY: Past Surgical History:  Procedure Laterality Date   CHOLECYSTECTOMY     ENDOMETRIAL ABLATION     KNEE ARTHROSCOPY Right 04/20/12   KNEE ARTHROSCOPY WITH LATERAL MENISECTOMY Left 02/25/2016   Procedure: LEFT KNEE ARTHROSCOPY WITH MEDIAL AND  LATERAL MENISECTOMIES;  Surgeon: Fonda Olmsted, MD;  Location: [facility];  Service: Orthopedics;  Laterality: Left;   KNEE ARTHROSCOPY WITH MEDIAL MENISECTOMY Left 02/25/2016   Procedure: KNEE ARTHROSCOPY WITH MEDIAL MENISECTOMY;  Surgeon: Fonda Olmsted, MD;  Location: [facility];  Service: Orthopedics;  Laterality: Left;   TONSILLECTOMY     TYMPANOPLASTY Left    TYMPANOSTOMY TUBE PLACEMENT     as child-both   UPPER GASTROINTESTINAL ENDOSCOPY  2003, 2007   done by Dr. Cloretta    FAMILY HISTORY: Family History  Problem Relation Age of Onset   Hypertension Mother    Stroke Mother    Heart disease Mother    Diabetes Mother    Colon polyps Mother    Breast cancer Paternal Grandmother    Kidney cancer Maternal Uncle    Heart disease Maternal Uncle    Diabetes Maternal Uncle    Diabetes Maternal Grandmother    Heart disease Maternal Grandmother    Emphysema Paternal Uncle        never smoker   Emphysema Paternal Grandfather        never smoker   Colon cancer Paternal Aunt        with met to lung    Non-Hodgkin's lymphoma Maternal Uncle    Heart disease Maternal Grandfather  SOCIAL HISTORY: Social History   Socioeconomic History   Marital status: Divorced    Spouse name: Not on file   Number of children: 0   Years of education: 12   Highest education level: Not on file  Occupational  History   Occupation: disabled    Employer: UNEMPLOYED  Tobacco Use   Smoking status: Never   Smokeless tobacco: Never  Vaping Use   Vaping status: Never Used  Substance and Sexual Activity   Alcohol use: No    Alcohol/week: 0.0 standard drinks of alcohol   Drug use: No   Sexual activity: Not Currently    Partners: Male    Birth control/protection: Surgical  Other Topics Concern   Not on file  Social History Narrative      Patient is disabled.   Right handed.   Caffeine - Diet soda., tea   Education - high school   Social Drivers of Corporate investment banker Strain: Not on file  Food Insecurity: Low Risk  (09/05/2023)   Received from [facility]   Hunger Vital Sign    Within the past 12 months, you worried that your food would run out before you got money to buy more: Never true    Within the past 12 months, the food you bought just didn't last and you didn't have money to get more. : Never true  Transportation Needs: No Transportation Needs (09/05/2023)   Received from [facility]    In the past 12 months, has lack of reliable transportation kept you from medical appointments, meetings, work or from getting things needed for daily living? : No  Physical Activity: Not on file  Stress: Not on file  Social Connections: Not on file  Intimate Partner Violence: Not on file      Modena Callander, M.D. Ph.D.  [facility] [address], [address] [place], [place] [address] Ph: [phone] Fax: [phone]  CC:  Elicia Sharper, DO [address], [address] [place],  [place] [address]  Elicia Sharper, DO

## 2024-03-13 NOTE — Telephone Encounter (Signed)
 Call to [facility], no answer. Left message to return call about CPAP and supplies.  Faxed order to [facility]

## 2024-03-14 NOTE — Telephone Encounter (Signed)
 Emailed start form to [facility]:

## 2024-03-22 ENCOUNTER — Other Ambulatory Visit: Payer: Self-pay | Admitting: Neurology

## 2024-04-01 ENCOUNTER — Ambulatory Visit: Admitting: Adult Health

## 2024-04-02 NOTE — Telephone Encounter (Signed)
 Called patient to remind her of appt with Sarah. She stated that she has not received her CPAP yet and that she hasn't heard anything from [facility]. I gave her their phone number and advised her to call and follow up with them and to call us  back to reschedule her appointment with Lauraine once she receives her CPAP.

## 2024-04-03 ENCOUNTER — Ambulatory Visit: Admitting: Neurology

## 2024-04-03 DIAGNOSIS — H5213 Myopia, bilateral: Secondary | ICD-10-CM | POA: Diagnosis not present

## 2024-04-08 DIAGNOSIS — K036 Deposits [accretions] on teeth: Secondary | ICD-10-CM | POA: Diagnosis not present

## 2024-04-10 ENCOUNTER — Other Ambulatory Visit: Payer: Self-pay | Admitting: Student

## 2024-04-15 ENCOUNTER — Encounter: Payer: Self-pay | Admitting: Student

## 2024-04-15 ENCOUNTER — Other Ambulatory Visit: Payer: Self-pay | Admitting: Infectious Disease

## 2024-04-15 ENCOUNTER — Ambulatory Visit: Payer: Self-pay | Admitting: Student

## 2024-04-15 VITALS — BP 155/83 | HR 79 | Temp 98.0°F | Ht 66.0 in | Wt 319.2 lb

## 2024-04-15 DIAGNOSIS — Z79899 Other long term (current) drug therapy: Secondary | ICD-10-CM

## 2024-04-15 DIAGNOSIS — I10 Essential (primary) hypertension: Secondary | ICD-10-CM | POA: Diagnosis present

## 2024-04-15 DIAGNOSIS — E785 Hyperlipidemia, unspecified: Secondary | ICD-10-CM | POA: Diagnosis not present

## 2024-04-15 DIAGNOSIS — E781 Pure hyperglyceridemia: Secondary | ICD-10-CM

## 2024-04-15 DIAGNOSIS — R251 Tremor, unspecified: Secondary | ICD-10-CM | POA: Diagnosis not present

## 2024-04-15 DIAGNOSIS — E119 Type 2 diabetes mellitus without complications: Secondary | ICD-10-CM

## 2024-04-15 DIAGNOSIS — Z794 Long term (current) use of insulin: Secondary | ICD-10-CM

## 2024-04-15 DIAGNOSIS — Z7985 Long-term (current) use of injectable non-insulin antidiabetic drugs: Secondary | ICD-10-CM | POA: Diagnosis not present

## 2024-04-15 DIAGNOSIS — G4700 Insomnia, unspecified: Secondary | ICD-10-CM | POA: Diagnosis not present

## 2024-04-15 DIAGNOSIS — Z7984 Long term (current) use of oral hypoglycemic drugs: Secondary | ICD-10-CM

## 2024-04-15 DIAGNOSIS — Z833 Family history of diabetes mellitus: Secondary | ICD-10-CM

## 2024-04-15 DIAGNOSIS — Z8249 Family history of ischemic heart disease and other diseases of the circulatory system: Secondary | ICD-10-CM

## 2024-04-15 DIAGNOSIS — F3341 Major depressive disorder, recurrent, in partial remission: Secondary | ICD-10-CM

## 2024-04-15 DIAGNOSIS — B20 Human immunodeficiency virus [HIV] disease: Secondary | ICD-10-CM

## 2024-04-15 LAB — POCT GLYCOSYLATED HEMOGLOBIN (HGB A1C): HbA1c, POC (controlled diabetic range): 9 % — AB (ref 0.0–7.0)

## 2024-04-15 LAB — GLUCOSE, CAPILLARY: Glucose-Capillary: 164 mg/dL — ABNORMAL HIGH (ref 70–99)

## 2024-04-15 MED ORDER — LOSARTAN POTASSIUM 100 MG PO TABS: 100.0000 mg | ORAL_TABLET | Freq: Every day | ORAL | 3 refills | Status: AC

## 2024-04-15 MED ORDER — ZOLPIDEM TARTRATE 10 MG PO TABS: ORAL_TABLET | ORAL | 3 refills | Status: AC

## 2024-04-15 NOTE — Progress Notes (Signed)
 CC: Multiple concerns  HPI: [name] is a 58 y.o. female living with a history stated below and presents today for multiple concerns. Please see problem based assessment and plan for additional details.  Past Medical History:  Diagnosis Date   Acute gastritis without mention of hemorrhage 01/01/2002   Anemia 10/2009   macrocytic anemia with baseline MCV 104-106   Anxiety    Asthma    Boil 01/05/2017   Cellulitis 05/20/2020   Chondromalacia of right knee 04/20/2012   Complex tear of medial meniscus of left knee as current injury 02/25/2016   Complication of anesthesia    hard to wake up   Constipation 11/17/2009   COPD (chronic obstructive pulmonary disease) (HCC)    chronic bronchitis   Derangement of anterior horn of lateral meniscus of left knee 02/25/2016   Duodenitis without mention of hemorrhage 01/01/2002   DVT (deep venous thrombosis) (HCC)    history clot lt groin when she had lymphoma-in remision now.   Elevated triglycerides with high cholesterol 01/05/2017   Erythema 09/01/2021   Gastroparesis    GERD (gastroesophageal reflux disease)    Hiatal hernia 01/01/2002   HIV infection (HCC)    CD4 = 570 (05/2010), VL undetectation   Hyperlipidemia    IBS (irritable bowel syndrome)    Migraines    on topamax  and triptans, frequent (almost daily) attacks    MRSA (methicillin resistant staph aureus) culture positive 12/20/2010   Non Hodgkin's lymphoma (HCC)    stage II, s/p resection, chemotherapy, radiotherapy, Dr. Freddie is her oncologist.    OSA (obstructive sleep apnea) 03/05/2024   PUD (peptic ulcer disease) 07/1997   per EGD report 07/1997 with history of esophagitis   Restless leg syndrome 12/24/2015   Shingles    in lumbar dermatome   Spondylolisthesis of lumbar region    L5-S1   TMJ (dislocation of temporomandibular joint)    Traumatic tear of lateral meniscus of right knee 04/20/2012   Umbilical bleeding 09/01/2021   Vaginal yeast infection  09/25/2017    Current Outpatient Medications on File Prior to Visit  Medication Sig Dispense Refill   albuterol  (PROAIR  HFA) 108 (90 Base) MCG/ACT inhaler USE 1 TO 2 PUFFS EVERY 4-6 HOURS AS NEEDED FOR SHORTNESS OF BREATH ORWHEEZING 8.5 g 1   Atogepant  (QULIPTA ) 60 MG TABS Take 1 tablet (60 mg total) by mouth daily. 30 tablet 11   Beclomethasone Dipropionate  80 MCG/ACT AERS Place 2 Inhalers into the nose daily. 8.7 g 10   blood glucose meter kit and supplies Dispense based on patient and insurance preference. Use up to four times daily as directed. (FOR ICD-9 250.00, 250.01). 1 each 0   Boric Acid Vaginal 600 MG SUPP Place 600 mg vaginally at bedtime. 21 suppository 0   BOTOX  100 units SOLR injection INJECT UP TO 200 UNITS  INTRAMUSCULARLY EVERY 90 DAYS 2 each 3   Calcium  Carbonate-Vitamin D  (CALCIUM -VITAMIN D ) 500-200 MG-UNIT per tablet Take 2 tablets by mouth daily.      Continuous Blood Gluc Receiver (DEXCOM G7 RECEIVER) DEVI 1 each by Does not apply route daily. 1 each 0   Continuous Blood Gluc Sensor (DEXCOM G7 SENSOR) MISC 1 each by Does not apply route every 14 (fourteen) days. 9 each 3   Darunavir -Cobicistat -Emtricitabine -Tenofovir  Alafenamide (SYMTUZA ) 800-150-200-10 MG TABS Take 1 tablet by mouth daily with breakfast. 30 tablet 11   diclofenac  Sodium (VOLTAREN ) 1 % GEL APPLY TOPICALLY TO AFFECTED AREA 4 GRAMS 4 TIMES DAILY 100  g 2   FARXIGA  10 MG TABS tablet TAKE 1 TABLET BY MOUTH EVERY DAY 90 tablet 3   glucose blood test strip Use as instructed 100 each 12   GNP VITAMIN B-12 1000 MCG TBCR TAKE 1 TABLET BY MOUTH EVERY DAY 90 tablet 0   icosapent  Ethyl (VASCEPA ) 1 g capsule Take 2 capsules (2 g total) by mouth 2 (two) times daily. With meals 120 capsule 11   Insulin Aspart FlexPen (NOVOLOG) 100 UNIT/ML Inject into the skin.     Insulin Glargine (BASAGLAR KWIKPEN) 100 UNIT/ML SMARTSIG:30 Unit(s) SUB-Q Daily     lamoTRIgine  (LAMICTAL ) 100 MG tablet Take 1 tablet (100 mg total) by mouth  2 (two) times daily. 180 tablet 3   lamoTRIgine  (LAMICTAL ) 25 MG tablet 1 twice a day x1wk 2 twice a day x 2nd wk 3 twice a day x3rd wk 84 tablet 0   mometasone -formoterol  (DULERA ) 100-5 MCG/ACT AERO USE 2 PUFFS BY MOUTH THE FIRST THING IN THE MORNING AND THEN ANOTHER 2 PUFFS ABOUT 12 HOURS LATER 13 g 5   MOUNJARO 2.5 MG/0.5ML Pen Inject 2.5 mg into the skin once a week.     pantoprazole  (PROTONIX ) 40 MG tablet TAKE 1 TABLET BY MOUTH 2 TIMES A DAY FORACID REFLUX 60 tablet 4   Plecanatide  (TRULANCE ) 3 MG TABS Take 1 tablet by mouth daily. 30 tablet 0   propranolol  (INDERAL ) 20 MG tablet TAKE 1 TABLET BY MOUTH 2 TIMES A DAY 180 tablet 1   rosuvastatin  (CRESTOR ) 20 MG tablet Take 20 mg by mouth daily.     Tamsulosin HCl (FLOMAX) 0.4 MG CAPS Take 0.4 mg by mouth daily. 1-2 daily     tiZANidine  (ZANAFLEX ) 4 MG tablet Take 1 tablet (4 mg total) by mouth every 8 (eight) hours as needed for muscle spasms. 90 tablet 11   venlafaxine  XR (EFFEXOR -XR) 75 MG 24 hr capsule TAKE 1 CAPSULE BY MOUTH DAILY 30 capsule 2   zolmitriptan  (ZOMIG ) 5 MG tablet TAKE 1 TABLET BY MOUTH EVERY DAY AS NEEDED MIGRAINE 12 tablet 11   Current Facility-Administered Medications on File Prior to Visit  Medication Dose Route Frequency Provider Last Rate Last Admin   botulinum toxin Type A  (BOTOX ) injection 200 Units  200 Units Intramuscular Once Onita Duos, MD        Family History  Problem Relation Age of Onset   Hypertension Mother    Stroke Mother    Heart disease Mother    Diabetes Mother    Colon polyps Mother    Breast cancer Paternal Grandmother    Kidney cancer Maternal Uncle    Heart disease Maternal Uncle    Diabetes Maternal Uncle    Diabetes Maternal Grandmother    Heart disease Maternal Grandmother    Emphysema Paternal Uncle        never smoker   Emphysema Paternal Grandfather        never smoker   Colon cancer Paternal Aunt        with met to lung    Non-Hodgkin's lymphoma Maternal Uncle    Heart  disease Maternal Grandfather     Social History   Socioeconomic History   Marital status: Divorced    Spouse name: Not on file   Number of children: 0   Years of education: 12   Highest education level: Not on file  Occupational History   Occupation: disabled    Employer: UNEMPLOYED  Tobacco Use   Smoking status: Never  Smokeless tobacco: Never  Vaping Use   Vaping status: Never Used  Substance and Sexual Activity   Alcohol use: No    Alcohol/week: 0.0 standard drinks of alcohol   Drug use: No   Sexual activity: Not Currently    Partners: Male    Birth control/protection: Surgical  Other Topics Concern   Not on file  Social History Narrative      Patient is disabled.   Right handed.   Caffeine - Diet soda., tea   Education - high school   Social Drivers of Corporate investment banker Strain: Not on file  Food Insecurity: Low Risk  (09/05/2023)   Received from [facility]   Hunger Vital Sign    Within the past 12 months, you worried that your food would run out before you got money to buy more: Never true    Within the past 12 months, the food you bought just didn't last and you didn't have money to get more. : Never true  Transportation Needs: No Transportation Needs (09/05/2023)   Received from [facility]    In the past 12 months, has lack of reliable transportation kept you from medical appointments, meetings, work or from getting things needed for daily living? : No  Physical Activity: Not on file  Stress: Not on file  Social Connections: Not on file  Intimate Partner Violence: Not on file    Review of Systems: ROS negative except for what is noted on the assessment and plan.  Vitals:   04/15/24 1454 04/15/24 1528  BP: (!) 159/80 (!) 155/83  Pulse: 82 79  Temp: 98.3 F (36.8 C) 98 F (36.7 C)  TempSrc: Oral Oral  SpO2: 97%   Weight: (!) 319 lb 3.2 oz (144.8 kg)   Height: 5' 6 (1.676 m)    Physical Exam: Constitutional: Alert,  sitting up in chair, in no acute distress Cardiovascular: regular rate and rhythm Pulmonary/Chest: normal work of breathing on room air Neurological: alert & oriented x 3  Assessment & Plan:   Assessment & Plan Hypertension, unspecified type Status: Uncontrolled. BP today 155/83 on repeat.  Taking losartan 50 mg. Also on propranolol  20 mg BID. Last BMP in 04/2023 was WNL. Discussed increasing losartan vs adding additional agent, patient w/ pill burden elected increase dose.    Plan -Continue: propranolol  -Increase losartan to 100 mg daily w/ 1 month f/u OV -BMP today and repeat at next OV given increased ARB -At next OV, could consider combo pill of losartan-hydrochlorothiazide if additional agent needed (coverage w/ Medicaid) Type 2 diabetes mellitus without complication, without long-term current use of insulin (HCC) Status: Uncontrolled. Managed by [facility]. A1c today 9.0, improved. Currently on 100 units of Basaglar and NovoLog 50 units 3 times daily.  She also takes Farxiga  10 mg and Mounjaro 2.5 mg. Reports intermittent n/v. She reports not tolerating Victoza  in past due to GI side effects. She will discuss about Mounjaro w/ endocrinologist. Requests referral to podiatry, needs assistance w/ foot care and nail trimming in setting of uncontrolled T2DM. Has endo visit on 10/24.  Dyslipidemia Hypertriglyceridemia Prescribed Crestor  20 mg daily and Vascepa  2 g BID. States not tolerating Vascepa  now due to GI effects including belching. Unclear if it is from Vascepa  or possibly GLP-1. Will look at other options for hypertriglyceridemia that will not interact w/ Symtuza  (not fenofibrate ). Last triglycerides 356 in 10/2023, down from 500-700.   Plan -Continue Crestor  -Find alternative to Vascepa  compatible  w/ Symtuza   -Repeat (FASTING) lipid panel at future OV or lab visit   Recurrent major depressive disorder, in partial remission States venlafaxine  no longer helping at 75 mg  daily. Declined increasing dose. Declined SSRI since trialed several in past w/ mixed results. Declined resources for counseling and/or psychiatry. Ok w/ continuing venlafaxine  for now but will review alternatives for patient. She will re-evaluate for possible referral for counseling/mental health services.  Tremor Chronic hx of this. Sees neurology regarding this. Exacerbated w/ anxiety/depression and insomnia. Has EEG and MRI brain that was w/o acute findings. Patient to arrange f/u w/ neurology regarding this and her chronic migraines since cancelled recent appt.  Insomnia, unspecified type Refilled Ambien  today. PDMP reviewed and appropriate.   Orders Placed This Encounter  Procedures   Glucose, capillary   Basic metabolic panel with GFR   Lipid Profile   Ambulatory referral to Podiatry   POC Hbg A1C   Return in about 4 weeks (around 05/13/2024) for blood pressure.   Patient discussed with Dr. Machen  Ollen Rao, D.O. [facility] Internal Medicine, PGY-3 Phone: [phone] Date 04/15/2024 Time 8:00 PM

## 2024-04-15 NOTE — Assessment & Plan Note (Signed)
 Refilled Ambien  today. PDMP reviewed and appropriate.

## 2024-04-15 NOTE — Assessment & Plan Note (Signed)
 Status: Uncontrolled. BP today 155/83 on repeat.  Taking losartan 50 mg. Also on propranolol  20 mg BID. Last BMP in 04/2023 was WNL. Discussed increasing losartan vs adding additional agent, patient w/ pill burden elected increase dose.    Plan -Continue: propranolol  -Increase losartan to 100 mg daily w/ 1 month f/u OV -BMP today and repeat at next OV given increased ARB -At next OV, could consider combo pill of losartan-hydrochlorothiazide if additional agent needed (coverage w/ Medicaid)

## 2024-04-15 NOTE — Assessment & Plan Note (Addendum)
 States venlafaxine  no longer helping at 75 mg daily. Declined increasing dose. Declined SSRI since trialed several in past w/ mixed results. Declined resources for counseling and/or psychiatry. Ok w/ continuing venlafaxine  for now but will review alternatives for patient. She will re-evaluate for possible referral for counseling/mental health services.

## 2024-04-15 NOTE — Assessment & Plan Note (Addendum)
 Chronic hx of this. Sees neurology regarding this. Exacerbated w/ anxiety/depression and insomnia. Has EEG and MRI brain that was w/o acute findings. Patient to arrange f/u w/ neurology regarding this and her chronic migraines since cancelled recent appt.

## 2024-04-15 NOTE — Patient Instructions (Addendum)
 Thank you, Ms.Grayce JONELLE Haskell for allowing us  to provide your care today. Today we discussed:  -Increase Losartan to 100 mg daily given elevated blood pressure -A1c today is 9.0. Continue seeing your endocrinologist. Discuss about Mounjaro if giving side effects -Blood work today, I will call with results -Refill sent for Ambien   -Continue follow up with neurology   Referrals ordered today:  Referral Orders         Ambulatory referral to Podiatry       Follow up: 1 month   Should you have any questions or concerns please call the internal medicine clinic at [phone].    Oyinkansola Truax, D.O. [facility] Internal Medicine [facility] Address: [address], [place], [place] [address] Phone: [phone]

## 2024-04-15 NOTE — Assessment & Plan Note (Signed)
 Prescribed Crestor  20 mg daily and Vascepa  2 g BID. States not tolerating Vascepa  now due to GI effects including belching. Unclear if it is from Vascepa  or possibly GLP-1. Will look at other options for hypertriglyceridemia that will not interact w/ Symtuza  (not fenofibrate ). Last triglycerides 356 in 10/2023, down from 500-700.   Plan -Continue Crestor  -Find alternative to Vascepa  compatible w/ Symtuza   -Repeat (FASTING) lipid panel at future OV or lab visit

## 2024-04-15 NOTE — Telephone Encounter (Signed)
 Due for appt

## 2024-04-16 LAB — BASIC METABOLIC PANEL WITH GFR
BUN/Creatinine Ratio: 10 (ref 9–23)
BUN: 7 mg/dL (ref 6–24)
CO2: 23 mmol/L (ref 20–29)
Calcium: 9.4 mg/dL (ref 8.7–10.2)
Chloride: 96 mmol/L (ref 96–106)
Creatinine, Ser: 0.71 mg/dL (ref 0.57–1.00)
Glucose: 149 mg/dL — ABNORMAL HIGH (ref 70–99)
Potassium: 4 mmol/L (ref 3.5–5.2)
Sodium: 137 mmol/L (ref 134–144)
eGFR: 99 mL/min/1.73 (ref >59)

## 2024-04-16 NOTE — Progress Notes (Signed)
 Internal Medicine Clinic Attending  Case discussed with the resident at the time of the visit.  We reviewed the resident's history and exam and pertinent patient test results.  I agree with the assessment, diagnosis, and plan of care documented in the resident's note.

## 2024-04-17 ENCOUNTER — Telehealth: Payer: Self-pay

## 2024-04-17 DIAGNOSIS — B20 Human immunodeficiency virus [HIV] disease: Secondary | ICD-10-CM

## 2024-04-17 DIAGNOSIS — G4733 Obstructive sleep apnea (adult) (pediatric): Secondary | ICD-10-CM | POA: Diagnosis not present

## 2024-04-17 MED ORDER — SYMTUZA 800-150-200-10 MG PO TABS: 1.0000 | ORAL_TABLET | Freq: Every day | ORAL | 1 refills | Status: DC

## 2024-04-17 NOTE — Telephone Encounter (Signed)
 Patient called to reschedule appointment for 12/23 with Dr. Fleeta Rothman. Will need medication refill for Symtuza , Pharmacy: [facility], [place] - [id number] best contact number is [phone]

## 2024-04-17 NOTE — Addendum Note (Signed)
 Addended by: Rose-Marie Hickling M on: 04/17/2024 10:52 AM   Modules accepted: Orders

## 2024-04-18 ENCOUNTER — Ambulatory Visit: Payer: Self-pay | Admitting: Student

## 2024-04-22 ENCOUNTER — Telehealth: Payer: Self-pay | Admitting: *Deleted

## 2024-04-22 DIAGNOSIS — N949 Unspecified condition associated with female genital organs and menstrual cycle: Secondary | ICD-10-CM

## 2024-04-22 NOTE — Telephone Encounter (Signed)
 Call to patient -message left that Dr. Elicia would like for her to return to the Clinics on Monday or Wednesday for a B/P recheck and labs only she will not need to see the doctor.

## 2024-04-22 NOTE — Telephone Encounter (Signed)
-----   Message from Ozell Nearing sent at 04/19/2024  5:06 PM EDT ----- Regarding: Lab only and BP visit Can we call patient and schedule lab only visit with RN BP visit early next week Mon-Wed? Thanks

## 2024-04-22 NOTE — Telephone Encounter (Signed)
 Call to patient said that she had talked to Dr. Elicia already about coming in for labs and B/P check.  Also requesting to do a swab for yeast.  Will need orders for the swab.

## 2024-04-24 ENCOUNTER — Other Ambulatory Visit

## 2024-04-24 ENCOUNTER — Ambulatory Visit

## 2024-05-01 ENCOUNTER — Ambulatory Visit: Admitting: *Deleted

## 2024-05-01 ENCOUNTER — Other Ambulatory Visit (HOSPITAL_COMMUNITY)
Admission: RE | Admit: 2024-05-01 | Discharge: 2024-05-01 | Disposition: A | Discharge status: Home / Self Care | Source: Ambulatory Visit | Attending: Internal Medicine | Admitting: Internal Medicine

## 2024-05-01 ENCOUNTER — Other Ambulatory Visit

## 2024-05-01 DIAGNOSIS — E785 Hyperlipidemia, unspecified: Secondary | ICD-10-CM | POA: Diagnosis not present

## 2024-05-01 DIAGNOSIS — N949 Unspecified condition associated with female genital organs and menstrual cycle: Secondary | ICD-10-CM

## 2024-05-01 DIAGNOSIS — Z0130 Encounter for examination of blood pressure without abnormal findings: Secondary | ICD-10-CM | POA: Diagnosis present

## 2024-05-01 NOTE — Progress Notes (Signed)
    [name] presented today for blood pressure check. Patient is prescribed blood pressure medications and I confirmed that patient did take their blood pressure medication prior to today's appointment. Blood pressure was taken in the usual and appropriate manner using an automated BP cuff. Pt stated her BP is high at home. Self- swab and labs done.   Vitals:   05/01/24 1036  BP: (!) 137/56      Results of today's visit will be routed to Dr. Elicia for review and further management.

## 2024-05-02 DIAGNOSIS — I10 Essential (primary) hypertension: Secondary | ICD-10-CM | POA: Diagnosis not present

## 2024-05-02 DIAGNOSIS — Z794 Long term (current) use of insulin: Secondary | ICD-10-CM | POA: Diagnosis not present

## 2024-05-02 DIAGNOSIS — E1165 Type 2 diabetes mellitus with hyperglycemia: Secondary | ICD-10-CM | POA: Diagnosis not present

## 2024-05-02 LAB — LIPID PANEL
Chol/HDL Ratio: 5.2 ratio — ABNORMAL HIGH (ref 0.0–4.4)
Cholesterol, Total: 182 mg/dL (ref 100–199)
HDL: 35 mg/dL — ABNORMAL LOW (ref >39)
LDL Chol Calc (NIH): 86 mg/dL (ref 0–99)
Triglycerides: 375 mg/dL — ABNORMAL HIGH (ref 0–149)
VLDL Cholesterol Cal: 61 mg/dL — ABNORMAL HIGH (ref 5–40)

## 2024-05-03 ENCOUNTER — Other Ambulatory Visit (HOSPITAL_COMMUNITY): Payer: Self-pay

## 2024-05-03 ENCOUNTER — Telehealth: Payer: Self-pay | Admitting: Pharmacist

## 2024-05-03 ENCOUNTER — Ambulatory Visit: Payer: Self-pay | Admitting: Student

## 2024-05-03 ENCOUNTER — Other Ambulatory Visit: Payer: Self-pay | Admitting: Student

## 2024-05-03 DIAGNOSIS — B379 Candidiasis, unspecified: Secondary | ICD-10-CM

## 2024-05-03 DIAGNOSIS — N898 Other specified noninflammatory disorders of vagina: Secondary | ICD-10-CM

## 2024-05-03 LAB — CERVICOVAGINAL ANCILLARY ONLY
Bacterial Vaginitis (gardnerella): NEGATIVE
Candida Glabrata: POSITIVE — AB
Candida Vaginitis: NEGATIVE
Chlamydia: NEGATIVE
Comment: NEGATIVE
Comment: NEGATIVE
Comment: NEGATIVE
Comment: NEGATIVE
Comment: NEGATIVE
Comment: NORMAL
Neisseria Gonorrhea: NEGATIVE
Trichomonas: NEGATIVE

## 2024-05-03 MED ORDER — BORIC ACID VAGINAL 600 MG VA SUPP: 600.0000 mg | Freq: Every evening | VAGINAL | 0 refills | Status: AC

## 2024-05-03 MED ORDER — OMEGA-3-ACID ETHYL ESTERS 1 G PO CAPS: 2.0000 g | ORAL_CAPSULE | Freq: Two times a day (BID) | ORAL | 11 refills | Status: AC

## 2024-05-03 MED ORDER — NYSTATIN 100000 UNIT/GM EX POWD: 1.0000 | Freq: Three times a day (TID) | CUTANEOUS | 1 refills | Status: DC

## 2024-05-03 NOTE — Telephone Encounter (Signed)
 Pharmacy Patient Advocate Encounter   Received notification from Patient Pharmacy that prior authorization for ZOLMitriptan  5MG  tablets is required/requested.   Insurance verification completed.   The patient is insured through [facility]   Per test claim: PA required; PA submitted to above mentioned insurance via Latent Key/confirmation #/EOC [facility] Status is pending

## 2024-05-03 NOTE — Progress Notes (Signed)
 Called patient. Discussed fasting lipid panel results. Declined starting fenofibrate , she is hesitant with taking it due to concerns with drug interactions (I already discussed it with Layna Fox and no interactions noted). Declined decreasing dose of Vascepa . So we will send over Lovaza 2 g BID to pharmacy. 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 Patient verbalizes understanding of plan and all questions addressed.

## 2024-05-03 NOTE — Telephone Encounter (Signed)
 Pharmacy Patient Advocate Encounter  Received notification from [employer] that Prior Authorization for zolmitriptan  (ZOMIG ) tablet has been APPROVED from 05/03/2024 to 05/03/2025   PA #/Case ID/Reference #: [id number]

## 2024-05-07 ENCOUNTER — Ambulatory Visit: Admitting: Podiatry

## 2024-05-08 ENCOUNTER — Ambulatory Visit: Admitting: Neurology

## 2024-05-18 DIAGNOSIS — G4733 Obstructive sleep apnea (adult) (pediatric): Secondary | ICD-10-CM | POA: Diagnosis not present

## 2024-05-21 ENCOUNTER — Other Ambulatory Visit: Payer: Self-pay | Admitting: Neurology

## 2024-05-28 ENCOUNTER — Ambulatory Visit: Admitting: Podiatry

## 2024-06-11 ENCOUNTER — Ambulatory Visit: Admitting: Podiatry

## 2024-06-12 ENCOUNTER — Ambulatory Visit: Admitting: Neurology

## 2024-06-13 ENCOUNTER — Telehealth: Payer: Self-pay | Admitting: Neurology

## 2024-06-13 ENCOUNTER — Ambulatory Visit: Admitting: Neurology

## 2024-06-13 ENCOUNTER — Encounter: Payer: Self-pay | Admitting: Neurology

## 2024-06-13 VITALS — BP 145/76 | HR 76 | Ht 66.0 in | Wt 306.5 lb

## 2024-06-13 DIAGNOSIS — G43711 Chronic migraine without aura, intractable, with status migrainosus: Secondary | ICD-10-CM | POA: Diagnosis not present

## 2024-06-13 DIAGNOSIS — R410 Disorientation, unspecified: Secondary | ICD-10-CM

## 2024-06-13 DIAGNOSIS — G43719 Chronic migraine without aura, intractable, without status migrainosus: Secondary | ICD-10-CM

## 2024-06-13 DIAGNOSIS — G4733 Obstructive sleep apnea (adult) (pediatric): Secondary | ICD-10-CM

## 2024-06-13 DIAGNOSIS — F32A Depression, unspecified: Secondary | ICD-10-CM

## 2024-06-13 MED ORDER — LAMOTRIGINE 100 MG PO TABS: 100.0000 mg | ORAL_TABLET | Freq: Two times a day (BID) | ORAL | 3 refills | Status: AC

## 2024-06-13 MED ORDER — ZOLMITRIPTAN 5 MG PO TABS: ORAL_TABLET | ORAL | 11 refills | Status: AC

## 2024-06-13 MED ORDER — PROPRANOLOL HCL 20 MG PO TABS: 20.0000 mg | ORAL_TABLET | Freq: Two times a day (BID) | ORAL | 3 refills | Status: AC

## 2024-06-13 MED ORDER — QULIPTA 60 MG PO TABS: 60.0000 mg | ORAL_TABLET | Freq: Every day | ORAL | 3 refills | Status: AC

## 2024-06-13 MED ORDER — ONABOTULINUMTOXINA 100 UNITS IJ SOLR: 200.0000 [IU] | Freq: Once | INTRAMUSCULAR | Status: AC

## 2024-06-13 NOTE — Progress Notes (Signed)
 Botox - 100 units x 2 vials Lot: [id number] Expiration: 2027/05 NDC: 9976-8854-98  Bacteriostatic 0.9% Sodium Chloride - 4 mL  Lot: [id number] Expiration: 2027/MAR/31 NDC: 9590803397  Dx: G43.711 .G43.719, R51.9  S/P  DRAWN by DR .YAN

## 2024-06-13 NOTE — Progress Notes (Signed)
 Chief Complaint  Patient presents with   Follow-up    Pt in EMG room 3. Alone. Here for migraine/tremor follow up.      ASSESSMENT AND PLAN  [name] is a 58 y.o. female   History of HIV, under good control, CD4 was within normal limit, HIV was not detectable by PCR   History of non-Hodgkin's lymphoma, completed with chemo therapy Obesity, diabetes poorly controlled, A1c was 10.5 in  Feb 2025, evidence of mild diabetic peripheral neuropathy  Depression anxiety, polypharmacy Intermittent body tremor   MRI of the brain with without contrast was normal in Jan 2025  EEG was normal in Feb 2025  Ordered 72 hours video EEG monitoring  Encouraged her to continue to work with her primary care and psychiatrist, hope better control of her depression anxiety,   Obstructive sleep apnea  Chronic migraine headaches        On Qulipta  60 mg daily, continue Botox  injection every 3 months, zomig  prn   Botox  injection for chronic migraine prevention, injection was performed according to [place] protocol,  5 units of Botox  was injected into each side, for 31 injection sites, total of 155 units  Bilateral frontalis 4 injection sites Bilateral corrugate 2 injection sites Procerus 1 injection sites. Bilateral temporalis 8 injection sites Bilateral occipitalis 6 injection sites Bilateral cervical paraspinals 4 injection sites Bilateral upper trapezius 6 injection sites  Extra 45 unites were injected into upper trapezius, cervical paraspinal muscles  DIAGNOSTIC DATA (LABS, IMAGING, TESTING) - I reviewed patient records, labs, notes, testing and imaging myself where available.   MEDICAL HISTORY:  [name], is a 58 year old female, accompanied by her mother to follow-up for chronic migraine, worsening tremor  History is obtained from the patient and review of electronic medical records. I personally reviewed pertinent available imaging films in PACS.   PMHx of  Chronic  bronchitis DM-insulin dependent GERD HLD Obesity HIV Chronic migraine Depression anxiety Non-Hodgkin's lymphoma in 2004, chemo, radiation, slow decline in functional status over the past many years due to obesity, sedentary lifestyle, chronic migraine, polypharmacy treatment  Reported history of migraines since age 88, progressive to her daily headaches, on polypharmacy for migraine prevention, her migraine often proceeding with visual aura, severe pounding headache, with blurry vision, light noise sensitivity, nauseous, her headache can last for days,  Trigger for her migraines are MSG, stress, weather change, sleep deprivation,  Tried and failed: Atenolol, verapamil , nortriptyline, Inderal , Depakote, Zonegran,Topamax , either not effective or could not tolerate the side effect  For abortive treatment, tried and failed, Imitrex , Maxalt, Zomig  as needed seems to work somewhat  She began to receive Botox  injection as migraine prevention since May 2013, every 3 months, with moderate help  MRI of the brain was normal in 2013  MRI lumbar in February 2020, chronic degenerative changes, L5-S1, L5 pars fracture, advanced the disc and moderate posterior element degeneration, no significant spinal lateral recess stenosis, but moderate to severe chronic bilateral L5 foraminal stenosis,   She was started on CGRP antagonist around 2022, along with Botox  as migraine prevention, Emgality  initially, now on aimovig , did help her headache,  Today she also complains about few years history of intermittent body tremor, whole body shaking can last for hours to days, without loss of consciousness, mother reported initial episode was in 2016 when she was under extreme stress, she was driving, while her father was at that hospital dying  She had sudden onset of body tremor, since then, she had  intermittent episode, getting worse over the past couple years, she reported a lot of stress, her sister who usually  accompanies her a doctor's appointment suffered fall, bony injury required multiple surgeries  She also complains of worsening depression anxiety, tried different medications without helping,  Poorly controlled diabetes, A1c was 11 in August 2024, bilateral foot paresthesia, suggestive of diabetic peripheral neuropathy  Triglyceride up to 717 in 2022, some improvement to 561  in August 2024, HIV was not detected  UPDATE Sept 10th 2025: She is accompanied by her mother at today's visit, complains of stress, not sleeping well, despite polypharmacy lamotrigine  100 mg twice a day, Effexor  75 mg daily Inderal  20 mg twice a day, depression is not under good control, she also reported slow Worsening body shaking episode, most recent episode lasted for 1 day, whole body tremor, could not sleep, could not eat,  MRI of the brain with without contrast August 01, 2023 was normal  EEG January 2025 was normal  TSH was normal in February 2025,  Home sleep study June 2024 confirmed moderate to severe obstructive sleep apnea, in the process of getting equipment  Continue to have frequent headaches 3 to 4 days a week, despite Qulipta  every day, using frequent Zomig   PHYSICAL EXAM:   Vitals:   06/13/24 1120  BP: (!) 145/76  Pulse: 76  Weight: (!) 306 lb 8 oz (139 kg)  Height: 5' 6 (1.676 m)       PHYSICAL EXAMNIATION:  Gen: NAD, conversant, well nourised, well groomed                     Cardiovascular: Regular rate rhythm, no peripheral edema, warm, nontender. Eyes: Conjunctivae clear without exudates or hemorrhage Neck: Supple, no carotid bruits. Pulmonary: Clear to auscultation bilaterally   NEUROLOGICAL EXAM:  MENTAL STATUS: Speech/cognition: Depressed looking obese female awake, alert, oriented to history taking and casual conversation CRANIAL NERVES: CN II: Visual fields are full to confrontation. Pupils are round equal and briskly reactive to light. CN III, IV, VI: extraocular  movement are normal. No ptosis. CN V: Facial sensation is intact to light touch CN VII: Face is symmetric with normal eye closure  CN VIII: Hearing is normal to causal conversation. CN IX, X: Phonation is normal. CN XI: Head turning and shoulder shrug are intact  MOTOR: There is no pronator drift of out-stretched arms. Muscle bulk and tone are normal. Muscle strength is normal.  REFLEXES: Reflexes are 2+ and symmetric at the biceps, triceps, knees, and ankles. Plantar responses are flexor.  SENSORY: Mildly length-dependent decreased light touch pinprick  COORDINATION: There is no trunk or limb dysmetria noted.  GAIT/STANCE: Push-up to get up from seated position limited by her big body habitus, steady  REVIEW OF SYSTEMS:  Full 14 system review of systems performed and notable only for as above All other review of systems were negative.   ALLERGIES: Allergies  Allergen Reactions   Divalproex Sodium Nausea And Vomiting    Causes light-headedness   Gabapentin Nausea And Vomiting    confusion   Ondansetron  Hcl Nausea And Vomiting    Triggers migraines, makes me vomit   Pineapple    Propoxyphene N-Acetaminophen  Nausea And Vomiting   Simvastatin      PATIENT CANNOT BE PRESCRIBED THIS STATIN WHILE RECEIVING NORVIR    Zocor  [Simvastatin  - High Dose]    Penicillins     REACTION: lips swollen to point of bleeding, sever vaginal irritation  Reports tolerating augmentin  well without  any complaints or reaction    HOME MEDICATIONS: Current Outpatient Medications  Medication Sig Dispense Refill   albuterol  (PROAIR  HFA) 108 (90 Base) MCG/ACT inhaler USE 1 TO 2 PUFFS EVERY 4-6 HOURS AS NEEDED FOR SHORTNESS OF BREATH ORWHEEZING 8.5 g 1   Atogepant  (QULIPTA ) 60 MG TABS Take 1 tablet (60 mg total) by mouth daily. 30 tablet 11   Beclomethasone Dipropionate  80 MCG/ACT AERS Place 2 Inhalers into the nose daily. 8.7 g 10   blood glucose meter kit and supplies Dispense based on patient and  insurance preference. Use up to four times daily as directed. (FOR ICD-9 250.00, 250.01). 1 each 0   BOTOX  100 units SOLR injection INJECT UP TO 200 UNITS  INTRAMUSCULARLY EVERY 90 DAYS 2 each 3   Calcium  Carbonate-Vitamin D  (CALCIUM -VITAMIN D ) 500-200 MG-UNIT per tablet Take 2 tablets by mouth daily.      Continuous Blood Gluc Receiver (DEXCOM G7 RECEIVER) DEVI 1 each by Does not apply route daily. 1 each 0   Continuous Blood Gluc Sensor (DEXCOM G7 SENSOR) MISC 1 each by Does not apply route every 14 (fourteen) days. 9 each 3   Darunavir -Cobicistat -Emtricitabine -Tenofovir  Alafenamide (SYMTUZA ) 800-150-200-10 MG TABS Take 1 tablet by mouth daily with breakfast. 30 tablet 1   diclofenac  Sodium (VOLTAREN ) 1 % GEL APPLY TOPICALLY TO AFFECTED AREA 4 GRAMS 4 TIMES DAILY 100 g 2   FARXIGA  10 MG TABS tablet TAKE 1 TABLET BY MOUTH EVERY DAY 90 tablet 3   glucose blood test strip Use as instructed 100 each 12   GNP VITAMIN B-12 1000 MCG TBCR TAKE 1 TABLET BY MOUTH EVERY DAY 90 tablet 0   Insulin Aspart FlexPen (NOVOLOG) 100 UNIT/ML Inject into the skin.     Insulin Glargine (BASAGLAR KWIKPEN) 100 UNIT/ML SMARTSIG:30 Unit(s) SUB-Q Daily     lamoTRIgine  (LAMICTAL ) 100 MG tablet TAKE 1 TABLET BY MOUTH 2 TIMES A DAY 60 tablet 11   lamoTRIgine  (LAMICTAL ) 25 MG tablet 1 twice a day x1wk 2 twice a day x 2nd wk 3 twice a day x3rd wk 84 tablet 0   losartan  (COZAAR ) 100 MG tablet Take 1 tablet (100 mg total) by mouth daily. 90 tablet 3   mometasone -formoterol  (DULERA ) 100-5 MCG/ACT AERO USE 2 PUFFS BY MOUTH THE FIRST THING IN THE MORNING AND THEN ANOTHER 2 PUFFS ABOUT 12 HOURS LATER 13 g 5   MOUNJARO 2.5 MG/0.5ML Pen Inject 2.5 mg into the skin once a week.     nystatin  powder Apply 1 Application topically 3 (three) times daily. 15 g 1   omega-3 acid ethyl esters (LOVAZA ) 1 g capsule Take 2 capsules (2 g total) by mouth 2 (two) times daily. 120 capsule 11   pantoprazole  (PROTONIX ) 40 MG tablet TAKE 1 TABLET BY  MOUTH 2 TIMES A DAY FORACID REFLUX 60 tablet 4   Plecanatide  (TRULANCE ) 3 MG TABS Take 1 tablet by mouth daily. 30 tablet 0   propranolol  (INDERAL ) 20 MG tablet TAKE 1 TABLET BY MOUTH 2 TIMES A DAY 180 tablet 1   rosuvastatin  (CRESTOR ) 20 MG tablet Take 20 mg by mouth daily.     Tamsulosin HCl (FLOMAX) 0.4 MG CAPS Take 0.4 mg by mouth daily. 1-2 daily     tiZANidine  (ZANAFLEX ) 4 MG tablet TAKE 1 TABLET BY MOUTH EVERY 8 HOURS AS NEEDED FOR MUSCLE SPASMS 90 tablet 11   zolmitriptan  (ZOMIG ) 5 MG tablet TAKE 1 TABLET BY MOUTH EVERY DAY AS NEEDED MIGRAINE 12 tablet 11  zolpidem  (AMBIEN ) 10 MG tablet TAKE 1 TABLET BY MOUTH AT BEDTIME AS NEEDED FOR SLEEP (TRY NOT TO TAKE NIGHTLY) 30 tablet 3   venlafaxine  XR (EFFEXOR -XR) 75 MG 24 hr capsule TAKE 1 CAPSULE BY MOUTH DAILY (Patient not taking: Reported on 06/13/2024) 30 capsule 2   Current Facility-Administered Medications  Medication Dose Route Frequency Provider Last Rate Last Admin   botulinum toxin Type A  (BOTOX ) injection 200 Units  200 Units Intramuscular Once Onita Duos, MD        PAST MEDICAL HISTORY: Past Medical History:  Diagnosis Date   Acute gastritis without mention of hemorrhage 01/01/2002   Anemia 10/2009   macrocytic anemia with baseline MCV 104-106   Anxiety    Asthma    Boil 01/05/2017   Cellulitis 05/20/2020   Chondromalacia of right knee 04/20/2012   Complex tear of medial meniscus of left knee as current injury 02/25/2016   Complication of anesthesia    hard to wake up   Constipation 11/17/2009   COPD (chronic obstructive pulmonary disease) (HCC)    chronic bronchitis   Derangement of anterior horn of lateral meniscus of left knee 02/25/2016   Duodenitis without mention of hemorrhage 01/01/2002   DVT (deep venous thrombosis) (HCC)    history clot lt groin when she had lymphoma-in remision now.   Elevated triglycerides with high cholesterol 01/05/2017   Erythema 09/01/2021   Gastroparesis    GERD (gastroesophageal  reflux disease)    Hiatal hernia 01/01/2002   HIV infection (HCC)    CD4 = 570 (05/2010), VL undetectation   Hyperlipidemia    IBS (irritable bowel syndrome)    Migraines    on topamax  and triptans, frequent (almost daily) attacks    MRSA (methicillin resistant staph aureus) culture positive 12/20/2010   Non Hodgkin's lymphoma (HCC)    stage II, s/p resection, chemotherapy, radiotherapy, Dr. Freddie is her oncologist.    OSA (obstructive sleep apnea) 03/05/2024   PUD (peptic ulcer disease) 07/1997   per EGD report 07/1997 with history of esophagitis   Restless leg syndrome 12/24/2015   Shingles    in lumbar dermatome   Spondylolisthesis of lumbar region    L5-S1   TMJ (dislocation of temporomandibular joint)    Traumatic tear of lateral meniscus of right knee 04/20/2012   Umbilical bleeding 09/01/2021   Vaginal yeast infection 09/25/2017    PAST SURGICAL HISTORY: Past Surgical History:  Procedure Laterality Date   CHOLECYSTECTOMY     ENDOMETRIAL ABLATION     KNEE ARTHROSCOPY Right 04/20/12   KNEE ARTHROSCOPY WITH LATERAL MENISECTOMY Left 02/25/2016   Procedure: LEFT KNEE ARTHROSCOPY WITH MEDIAL AND  LATERAL MENISECTOMIES;  Surgeon: Fonda Olmsted, MD;  Location: [facility];  Service: Orthopedics;  Laterality: Left;   KNEE ARTHROSCOPY WITH MEDIAL MENISECTOMY Left 02/25/2016   Procedure: KNEE ARTHROSCOPY WITH MEDIAL MENISECTOMY;  Surgeon: Fonda Olmsted, MD;  Location: [facility];  Service: Orthopedics;  Laterality: Left;   TONSILLECTOMY     TYMPANOPLASTY Left    TYMPANOSTOMY TUBE PLACEMENT     as child-both   UPPER GASTROINTESTINAL ENDOSCOPY  2003, 2007   done by Dr. Cloretta    FAMILY HISTORY: Family History  Problem Relation Age of Onset   Hypertension Mother    Stroke Mother    Heart disease Mother    Diabetes Mother    Colon polyps Mother    Breast cancer Paternal Grandmother    Kidney cancer Maternal Uncle    Heart disease  Maternal  Uncle    Diabetes Maternal Uncle    Diabetes Maternal Grandmother    Heart disease Maternal Grandmother    Emphysema Paternal Uncle        never smoker   Emphysema Paternal Grandfather        never smoker   Colon cancer Paternal Aunt        with met to lung    Non-Hodgkin's lymphoma Maternal Uncle    Heart disease Maternal Grandfather     SOCIAL HISTORY: Social History   Socioeconomic History   Marital status: Divorced    Spouse name: Not on file   Number of children: 0   Years of education: 12   Highest education level: Not on file  Occupational History   Occupation: disabled    Associate Professor: UNEMPLOYED  Tobacco Use   Smoking status: Never   Smokeless tobacco: Never  Vaping Use   Vaping status: Never Used  Substance and Sexual Activity   Alcohol use: No    Alcohol/week: 0.0 standard drinks of alcohol   Drug use: No   Sexual activity: Not Currently    Partners: Male    Birth control/protection: Surgical  Other Topics Concern   Not on file  Social History Narrative      Patient is disabled.   Right handed.   Caffeine - Diet soda., tea   Education - high school   Social Drivers of Health   Tobacco Use: Low Risk (06/13/2024)   Patient History    Smoking Tobacco Use: Never    Smokeless Tobacco Use: Never    Passive Exposure: Not on file  Financial Resource Strain: Not on file  Food Insecurity: Low Risk (09/05/2023)   Received from [facility]   Epic    Within the past 12 months, you worried that your food would run out before you got money to buy more: Never true    Within the past 12 months, the food you bought just didn't last and you didn't have money to get more. : Never true  Transportation Needs: No Transportation Needs (09/05/2023)   Received from [facility]    In the past 12 months, has lack of reliable transportation kept you from medical appointments, meetings, work or from getting things needed for daily living? : No  Physical  Activity: Not on file  Stress: Not on file  Social Connections: Not on file  Intimate Partner Violence: Not on file  Depression (PHQ2-9): Medium Risk (10/25/2023)   Depression (PHQ2-9)    PHQ-2 Score: 10  Alcohol Screen: Not on file  Housing: Low Risk (09/05/2023)   Received from [facility]   Epic    What is your living situation today?: I have a steady place to live    Think about the place you live. Do you have problems with any of the following? Choose all that apply:: None/None on this list  Utilities: Low Risk (09/05/2023)   Received from [facility]   Utilities    In the past 12 months has the electric, gas, oil, or water company threatened to shut off services in your home? : No  Health Literacy: Not on file      Modena Callander, M.D. Ph.D.  [facility] [address], [address] [place], [place] [address] Ph: [phone] Fax: [phone]  CC:  Elicia Sharper, DO [address] [place],  [place] [address]  Elicia Sharper, DO

## 2024-06-13 NOTE — Telephone Encounter (Signed)
 Ordered Ambulatory EEG

## 2024-06-17 NOTE — Telephone Encounter (Signed)
 Form pending to be signed by Dr.Yan

## 2024-06-18 ENCOUNTER — Other Ambulatory Visit: Payer: Self-pay | Admitting: Infectious Disease

## 2024-06-18 DIAGNOSIS — B20 Human immunodeficiency virus [HIV] disease: Secondary | ICD-10-CM

## 2024-06-18 NOTE — Telephone Encounter (Signed)
 Order emailed to And faxed to [phone]. Confirmation recieved

## 2024-06-18 NOTE — Telephone Encounter (Signed)
 Confirmed with [facility] that patient has one more refill on file. Appointment scheduled for 12/23, additional refills to be provided then.   Marabella Popiel, BSN, RN

## 2024-06-19 ENCOUNTER — Ambulatory Visit: Admitting: Neurology

## 2024-06-20 ENCOUNTER — Other Ambulatory Visit: Payer: Self-pay | Admitting: Student

## 2024-06-24 ENCOUNTER — Encounter: Payer: Self-pay | Admitting: Infectious Disease

## 2024-06-24 DIAGNOSIS — E785 Hyperlipidemia, unspecified: Secondary | ICD-10-CM

## 2024-06-24 HISTORY — DX: Hyperlipidemia, unspecified: E78.5

## 2024-06-24 NOTE — Telephone Encounter (Signed)
 [facility] w/ [facility] called and needed last OV. Notes faxed to [facility]. Received a receipt of confirmation.

## 2024-06-24 NOTE — Progress Notes (Unsigned)
 "  Subjective:  Chief complaint: follow-up for HIV disease on medications   Patient ID: [name], female    DOB: 04-Mar-1966, 58 y.o.   MRN: [id number]  HPI  Past Medical History:  Diagnosis Date   Acute gastritis without mention of hemorrhage 01/01/2002   Anemia 10/2009   macrocytic anemia with baseline MCV 104-106   Anxiety    Asthma    Boil 01/05/2017   Cellulitis 05/20/2020   Chondromalacia of right knee 04/20/2012   Complex tear of medial meniscus of left knee as current injury 02/25/2016   Complication of anesthesia    hard to wake up   Constipation 11/17/2009   COPD (chronic obstructive pulmonary disease) (HCC)    chronic bronchitis   Derangement of anterior horn of lateral meniscus of left knee 02/25/2016   Duodenitis without mention of hemorrhage 01/01/2002   DVT (deep venous thrombosis) (HCC)    history clot lt groin when she had lymphoma-in remision now.   Elevated triglycerides with high cholesterol 01/05/2017   Erythema 09/01/2021   Gastroparesis    GERD (gastroesophageal reflux disease)    Hiatal hernia 01/01/2002   HIV infection (HCC)    CD4 = 570 (05/2010), VL undetectation   Hyperlipidemia    IBS (irritable bowel syndrome)    Migraines    on topamax  and triptans, frequent (almost daily) attacks    MRSA (methicillin resistant staph aureus) culture positive 12/20/2010   Non Hodgkin's lymphoma (HCC)    stage II, s/p resection, chemotherapy, radiotherapy, Dr. Freddie is her oncologist.    OSA (obstructive sleep apnea) 03/05/2024   PUD (peptic ulcer disease) 07/1997   per EGD report 07/1997 with history of esophagitis   Restless leg syndrome 12/24/2015   Shingles    in lumbar dermatome   Spondylolisthesis of lumbar region    L5-S1   TMJ (dislocation of temporomandibular joint)    Traumatic tear of lateral meniscus of right knee 04/20/2012   Umbilical bleeding 09/01/2021   Vaginal yeast infection 09/25/2017    Past Surgical History:  Procedure  Laterality Date   CHOLECYSTECTOMY     ENDOMETRIAL ABLATION     KNEE ARTHROSCOPY Right 04/20/12   KNEE ARTHROSCOPY WITH LATERAL MENISECTOMY Left 02/25/2016   Procedure: LEFT KNEE ARTHROSCOPY WITH MEDIAL AND  LATERAL MENISECTOMIES;  Surgeon: Fonda Olmsted, MD;  Location: [facility];  Service: Orthopedics;  Laterality: Left;   KNEE ARTHROSCOPY WITH MEDIAL MENISECTOMY Left 02/25/2016   Procedure: KNEE ARTHROSCOPY WITH MEDIAL MENISECTOMY;  Surgeon: Fonda Olmsted, MD;  Location: [facility];  Service: Orthopedics;  Laterality: Left;   TONSILLECTOMY     TYMPANOPLASTY Left    TYMPANOSTOMY TUBE PLACEMENT     as child-both   UPPER GASTROINTESTINAL ENDOSCOPY  2003, 2007   done by Dr. Cloretta    Family History  Problem Relation Age of Onset   Hypertension Mother    Stroke Mother    Heart disease Mother    Diabetes Mother    Colon polyps Mother    Breast cancer Paternal Grandmother    Kidney cancer Maternal Uncle    Heart disease Maternal Uncle    Diabetes Maternal Uncle    Diabetes Maternal Grandmother    Heart disease Maternal Grandmother    Emphysema Paternal Uncle        never smoker   Emphysema Paternal Grandfather        never smoker   Colon cancer Paternal Aunt  with met to lung    Non-Hodgkin's lymphoma Maternal Uncle    Heart disease Maternal Grandfather       Social History   Socioeconomic History   Marital status: Divorced    Spouse name: Not on file   Number of children: 0   Years of education: 12   Highest education level: Not on file  Occupational History   Occupation: disabled    Employer: UNEMPLOYED  Tobacco Use   Smoking status: Never   Smokeless tobacco: Never  Vaping Use   Vaping status: Never Used  Substance and Sexual Activity   Alcohol use: No    Alcohol/week: 0.0 standard drinks of alcohol   Drug use: No   Sexual activity: Not Currently    Partners: Male    Birth control/protection: Surgical  Other Topics  Concern   Not on file  Social History Narrative      Patient is disabled.   Right handed.   Caffeine - Diet soda., tea   Education - high school   Social Drivers of Health   Tobacco Use: Low Risk (06/13/2024)   Patient History    Smoking Tobacco Use: Never    Smokeless Tobacco Use: Never    Passive Exposure: Not on file  Financial Resource Strain: Not on file  Food Insecurity: Low Risk (09/05/2023)   Received from [facility]   Epic    Within the past 12 months, you worried that your food would run out before you got money to buy more: Never true    Within the past 12 months, the food you bought just didn't last and you didn't have money to get more. : Never true  Transportation Needs: No Transportation Needs (09/05/2023)   Received from [facility]    In the past 12 months, has lack of reliable transportation kept you from medical appointments, meetings, work or from getting things needed for daily living? : No  Physical Activity: Not on file  Stress: Not on file  Social Connections: Not on file  Depression (PHQ2-9): Medium Risk (10/25/2023)   Depression (PHQ2-9)    PHQ-2 Score: 10  Alcohol Screen: Not on file  Housing: Low Risk (09/05/2023)   Received from [facility]   Epic    What is your living situation today?: I have a steady place to live    Think about the place you live. Do you have problems with any of the following? Choose all that apply:: None/None on this list  Utilities: Low Risk (09/05/2023)   Received from [facility]   Utilities    In the past 12 months has the electric, gas, oil, or water company threatened to shut off services in your home? : No  Health Literacy: Not on file    Allergies[1]  Current Medications[2]   Review of Systems     Objective:   Physical Exam        Assessment & Plan:       [1]  Allergies Allergen Reactions   Divalproex Sodium Nausea And Vomiting    Causes light-headedness   Gabapentin  Nausea And Vomiting    confusion   Ondansetron  Hcl Nausea And Vomiting    Triggers migraines, makes me vomit   Pineapple    Propoxyphene N-Acetaminophen  Nausea And Vomiting   Simvastatin      PATIENT CANNOT BE PRESCRIBED THIS STATIN WHILE RECEIVING NORVIR    Zocor  [Simvastatin  - High Dose]    Penicillins     REACTION: lips  swollen to point of bleeding, sever vaginal irritation  Reports tolerating augmentin  well without any complaints or reaction  [2]  Current Outpatient Medications:    albuterol  (PROAIR  HFA) 108 (90 Base) MCG/ACT inhaler, USE 1 TO 2 PUFFS EVERY 4-6 HOURS AS NEEDED FOR SHORTNESS OF BREATH ORWHEEZING, Disp: 8.5 g, Rfl: 1   Atogepant  (QULIPTA ) 60 MG TABS, Take 1 tablet (60 mg total) by mouth daily., Disp: 90 tablet, Rfl: 3   Beclomethasone Dipropionate  80 MCG/ACT AERS, Place 2 Inhalers into the nose daily., Disp: 8.7 g, Rfl: 10   blood glucose meter kit and supplies, Dispense based on patient and insurance preference. Use up to four times daily as directed. (FOR ICD-9 250.00, 250.01)., Disp: 1 each, Rfl: 0   BOTOX  100 units SOLR injection, INJECT UP TO 200 UNITS  INTRAMUSCULARLY EVERY 90 DAYS, Disp: 2 each, Rfl: 3   Calcium  Carbonate-Vitamin D  (CALCIUM -VITAMIN D ) 500-200 MG-UNIT per tablet, Take 2 tablets by mouth daily. , Disp: , Rfl:    Continuous Blood Gluc Receiver (DEXCOM G7 RECEIVER) DEVI, 1 each by Does not apply route daily., Disp: 1 each, Rfl: 0   Continuous Blood Gluc Sensor (DEXCOM G7 SENSOR) MISC, 1 each by Does not apply route every 14 (fourteen) days., Disp: 9 each, Rfl: 3   Darunavir -Cobicistat -Emtricitabine -Tenofovir  Alafenamide (SYMTUZA ) 800-150-200-10 MG TABS, Take 1 tablet by mouth daily with breakfast., Disp: 30 tablet, Rfl: 1   diclofenac  Sodium (VOLTAREN ) 1 % GEL, APPLY TOPICALLY TO AFFECTED AREA 4 GRAMS 4 TIMES DAILY, Disp: 100 g, Rfl: 2   FARXIGA  10 MG TABS tablet, TAKE 1 TABLET BY MOUTH EVERY DAY, Disp: 90 tablet, Rfl: 3   glucose blood test strip, Use  as instructed, Disp: 100 each, Rfl: 12   GNP VITAMIN B-12 1000 MCG TBCR, TAKE 1 TABLET BY MOUTH EVERY DAY, Disp: 90 tablet, Rfl: 0   Insulin Aspart FlexPen (NOVOLOG) 100 UNIT/ML, Inject into the skin., Disp: , Rfl:    Insulin Glargine (BASAGLAR KWIKPEN) 100 UNIT/ML, SMARTSIG:30 Unit(s) SUB-Q Daily, Disp: , Rfl:    lamoTRIgine  (LAMICTAL ) 100 MG tablet, Take 1 tablet (100 mg total) by mouth 2 (two) times daily., Disp: 180 tablet, Rfl: 3   losartan  (COZAAR ) 100 MG tablet, Take 1 tablet (100 mg total) by mouth daily., Disp: 90 tablet, Rfl: 3   mometasone -formoterol  (DULERA ) 100-5 MCG/ACT AERO, USE 2 PUFFS BY MOUTH THE FIRST THING IN THE MORNING AND THEN ANOTHER 2 PUFFS ABOUT 12 HOURS LATER, Disp: 13 g, Rfl: 5   MOUNJARO 2.5 MG/0.5ML Pen, Inject 2.5 mg into the skin once a week., Disp: , Rfl:    nystatin  powder, Apply 1 Application topically 3 (three) times daily., Disp: 15 g, Rfl: 1   omega-3 acid ethyl esters (LOVAZA ) 1 g capsule, Take 2 capsules (2 g total) by mouth 2 (two) times daily., Disp: 120 capsule, Rfl: 11   pantoprazole  (PROTONIX ) 40 MG tablet, TAKE 1 TABLET BY MOUTH 2 TIMES A DAY FORACID REFLUX, Disp: 60 tablet, Rfl: 4   Plecanatide  (TRULANCE ) 3 MG TABS, Take 1 tablet by mouth daily., Disp: 30 tablet, Rfl: 0   propranolol  (INDERAL ) 20 MG tablet, Take 1 tablet (20 mg total) by mouth 2 (two) times daily., Disp: 180 tablet, Rfl: 3   rosuvastatin  (CRESTOR ) 20 MG tablet, TAKE 1 TABLET BY MOUTH EVERY DAY FOR CHOLESTEROL, Disp: 90 tablet, Rfl: 3   Tamsulosin HCl (FLOMAX) 0.4 MG CAPS, Take 0.4 mg by mouth daily. 1-2 daily, Disp: , Rfl:    tiZANidine  (ZANAFLEX )  4 MG tablet, TAKE 1 TABLET BY MOUTH EVERY 8 HOURS AS NEEDED FOR MUSCLE SPASMS, Disp: 90 tablet, Rfl: 11   zolmitriptan  (ZOMIG ) 5 MG tablet, Take 1 tab at onset of migraine.  May repeat in 2 hrs, if needed.  Max dose: 2 tabs/day. This is a 30 day prescription., Disp: 12 tablet, Rfl: 11   zolpidem  (AMBIEN ) 10 MG tablet, TAKE 1 TABLET BY MOUTH AT  BEDTIME AS NEEDED FOR SLEEP (TRY NOT TO TAKE NIGHTLY), Disp: 30 tablet, Rfl: 3  Current Facility-Administered Medications:    botulinum toxin Type A  (BOTOX ) injection 200 Units, 200 Units, Intramuscular, Once, Onita Duos, MD   botulinum toxin Type A  (BOTOX ) injection 200 Units, 200 Units, Intramuscular, Once, Onita Duos, MD  "

## 2024-06-25 ENCOUNTER — Ambulatory Visit: Admitting: Infectious Disease

## 2024-06-25 ENCOUNTER — Encounter: Payer: Self-pay | Admitting: Infectious Disease

## 2024-06-25 ENCOUNTER — Other Ambulatory Visit: Payer: Self-pay

## 2024-06-25 VITALS — BP 127/80 | HR 93 | Temp 97.8°F | Ht 66.0 in | Wt 310.0 lb

## 2024-06-25 DIAGNOSIS — B20 Human immunodeficiency virus [HIV] disease: Secondary | ICD-10-CM

## 2024-06-25 DIAGNOSIS — Z1231 Encounter for screening mammogram for malignant neoplasm of breast: Secondary | ICD-10-CM

## 2024-06-25 DIAGNOSIS — Z7185 Encounter for immunization safety counseling: Secondary | ICD-10-CM | POA: Diagnosis not present

## 2024-06-25 DIAGNOSIS — L02221 Furuncle of abdominal wall: Secondary | ICD-10-CM | POA: Diagnosis present

## 2024-06-25 DIAGNOSIS — E785 Hyperlipidemia, unspecified: Secondary | ICD-10-CM

## 2024-06-25 DIAGNOSIS — Z113 Encounter for screening for infections with a predominantly sexual mode of transmission: Secondary | ICD-10-CM | POA: Diagnosis not present

## 2024-06-25 DIAGNOSIS — G43711 Chronic migraine without aura, intractable, with status migrainosus: Secondary | ICD-10-CM

## 2024-06-25 DIAGNOSIS — Z23 Encounter for immunization: Secondary | ICD-10-CM

## 2024-06-25 DIAGNOSIS — Z79899 Other long term (current) drug therapy: Secondary | ICD-10-CM

## 2024-06-25 DIAGNOSIS — F4321 Adjustment disorder with depressed mood: Secondary | ICD-10-CM

## 2024-06-25 DIAGNOSIS — L0292 Furuncle, unspecified: Secondary | ICD-10-CM

## 2024-06-25 DIAGNOSIS — E1142 Type 2 diabetes mellitus with diabetic polyneuropathy: Secondary | ICD-10-CM

## 2024-06-25 MED ORDER — DOXYCYCLINE HYCLATE 100 MG PO TABS: 100.0000 mg | ORAL_TABLET | Freq: Two times a day (BID) | ORAL | 2 refills | Status: AC

## 2024-06-25 MED ORDER — BIKTARVY 50-200-25 MG PO TABS: 1.0000 | ORAL_TABLET | Freq: Every day | ORAL | 11 refills | Status: DC

## 2024-06-25 MED ORDER — SYMTUZA 800-150-200-10 MG PO TABS: 1.0000 | ORAL_TABLET | Freq: Every day | ORAL | 1 refills | Status: DC

## 2024-06-25 NOTE — Addendum Note (Signed)
 Addended by: FLORENE BOUCHARD D on: 06/25/2024 09:40 AM   Modules accepted: Orders

## 2024-06-28 LAB — CBC WITH DIFFERENTIAL/PLATELET
Absolute Lymphocytes: 2679 {cells}/uL (ref 850–3900)
Absolute Monocytes: 320 {cells}/uL (ref 200–950)
Basophils Absolute: 27 {cells}/uL (ref 0–200)
Basophils Relative: 0.4 %
Eosinophils Absolute: 122 {cells}/uL (ref 15–500)
Eosinophils Relative: 1.8 %
HCT: 52 % — ABNORMAL HIGH (ref 35.9–46.0)
Hemoglobin: 16.8 g/dL — ABNORMAL HIGH (ref 11.7–15.5)
MCH: 27.5 pg (ref 27.0–33.0)
MCHC: 32.3 g/dL (ref 31.6–35.4)
MCV: 85.2 fL (ref 81.4–101.7)
MPV: 8.7 fL (ref 7.5–12.5)
Monocytes Relative: 4.7 %
Neutro Abs: 3652 {cells}/uL (ref 1500–7800)
Neutrophils Relative %: 53.7 %
Platelets: 279 Thousand/uL (ref 140–400)
RBC: 6.1 Million/uL — ABNORMAL HIGH (ref 3.80–5.10)
RDW: 14.8 % (ref 11.0–15.0)
Total Lymphocyte: 39.4 %
WBC: 6.8 Thousand/uL (ref 3.8–10.8)

## 2024-06-28 LAB — LIPID PANEL
Cholesterol: 153 mg/dL
HDL: 33 mg/dL — ABNORMAL LOW
Non-HDL Cholesterol (Calc): 120 mg/dL
Total CHOL/HDL Ratio: 4.6 (calc)
Triglycerides: 559 mg/dL — ABNORMAL HIGH

## 2024-06-28 LAB — COMPLETE METABOLIC PANEL WITHOUT GFR
AG Ratio: 1.3 (calc) (ref 1.0–2.5)
ALT: 8 U/L (ref 6–29)
AST: 12 U/L (ref 10–35)
Albumin: 4.5 g/dL (ref 3.6–5.1)
Alkaline phosphatase (APISO): 69 U/L (ref 37–153)
BUN: 13 mg/dL (ref 7–25)
CO2: 27 mmol/L (ref 20–32)
Calcium: 9.6 mg/dL (ref 8.6–10.4)
Chloride: 96 mmol/L — ABNORMAL LOW (ref 98–110)
Creat: 0.89 mg/dL (ref 0.50–1.03)
Globulin: 3.5 g/dL (ref 1.9–3.7)
Glucose, Bld: 183 mg/dL — ABNORMAL HIGH (ref 65–99)
Potassium: 4.4 mmol/L (ref 3.5–5.3)
Sodium: 132 mmol/L — ABNORMAL LOW (ref 135–146)
Total Bilirubin: 0.4 mg/dL (ref 0.2–1.2)
Total Protein: 8 g/dL (ref 6.1–8.1)

## 2024-06-28 LAB — SYPHILIS: RPR W/REFLEX TO RPR TITER AND TREPONEMAL ANTIBODIES, TRADITIONAL SCREENING AND DIAGNOSIS ALGORITHM: RPR Ser Ql: NONREACTIVE

## 2024-06-28 LAB — T-HELPER CELLS (CD4) COUNT (NOT AT ARMC)
Absolute CD4: 1081 {cells}/uL (ref 490–1740)
CD4 T Helper %: 41 % (ref 30–61)
Total lymphocyte count: 2655 {cells}/uL (ref 850–3900)

## 2024-06-28 LAB — HIV-1 RNA QUANT-NO REFLEX-BLD
HIV 1 RNA Quant: NOT DETECTED {copies}/mL
HIV-1 RNA Quant, Log: NOT DETECTED {Log_copies}/mL

## 2024-07-09 ENCOUNTER — Encounter: Payer: Self-pay | Admitting: Podiatry

## 2024-07-09 ENCOUNTER — Ambulatory Visit: Admitting: Podiatry

## 2024-07-09 ENCOUNTER — Telehealth: Payer: Self-pay

## 2024-07-09 DIAGNOSIS — B351 Tinea unguium: Secondary | ICD-10-CM | POA: Diagnosis not present

## 2024-07-09 DIAGNOSIS — M79675 Pain in left toe(s): Secondary | ICD-10-CM | POA: Diagnosis not present

## 2024-07-09 DIAGNOSIS — E1142 Type 2 diabetes mellitus with diabetic polyneuropathy: Secondary | ICD-10-CM

## 2024-07-09 DIAGNOSIS — M79674 Pain in right toe(s): Secondary | ICD-10-CM | POA: Diagnosis not present

## 2024-07-09 DIAGNOSIS — M216X1 Other acquired deformities of right foot: Secondary | ICD-10-CM | POA: Diagnosis not present

## 2024-07-09 DIAGNOSIS — M216X2 Other acquired deformities of left foot: Secondary | ICD-10-CM | POA: Diagnosis not present

## 2024-07-09 MED ORDER — CAPSAICIN 0.033 % EX CREA: 1.0000 | TOPICAL_CREAM | Freq: Two times a day (BID) | CUTANEOUS | 1 refills | Status: AC | PRN

## 2024-07-09 NOTE — Telephone Encounter (Signed)
 LVM Asking pt to bring her CPAP Machine with her to her appointment on tomorrow.

## 2024-07-09 NOTE — Patient Instructions (Signed)
 Look for over-the-counter capsaicin  cream and apply with nitrile gloves to your feet 1-2 times a day.  Use for at least 2 weeks to determine any efficacy for neuropathy pain.

## 2024-07-09 NOTE — Progress Notes (Signed)
 "  Subjective:  Patient ID: [name], female    DOB: 1966/03/17,  MRN: [id number]  Chief Complaint  Patient presents with   [facility] with Neuropathy    DFC no callous. She does have neuropathy in both feet with the right foot being worse then the left. She also has some ankle instability. Most important thing today is the nail care and neuropathy.  A1c was 8.5 in Oct, goes for recheck in Feb No anti coag.      Discussed the use of AI scribe software for clinical note transcription with the patient, who gave verbal consent to proceed.  History of Present Illness AHNYA [name] is a 59 year old female with type 2 diabetes and diabetic polyneuropathy who presents for evaluation of bilateral lower extremity neuropathy and right foot pain.  She has constant burning, tingling, and markedly diminished sensation in both feet, worse on the right, most pronounced above the toenails and at the posterior foot. She describes faint sensation with difficulty perceiving normal touch. Neuropathy has persisted despite prior gabapentin, which caused altered mental status, and Lyrica, which caused significant weight gain. She is not taking Cymbalta . Her most recent hemoglobin A1c was 8.5. She has difficulty trimming her toenails due to dystrophic appearance of the toenails which are thickened, difficult for the patient maintain, cause pain with pressure and shoe gear as well as due to decreased sensation.  She has recurrent episodes of pain and swelling along the bilateral medial ankle and arch with the right side more severe, sometimes extending to the posterior foot. Flares last several days to a week and can be severe enough to impair walking. She has pes planus and uses over-the-counter NSAIDs for relief.      Objective:    Physical Exam DP and PT pulses intact.  Cap refill intact to the digits.  Decreased pedal hair growth noted.  No diffuse edema Increased hindfoot pronation noted bilaterally with pronation  deformity noted bilaterally.  Tenderness on palpation of PT tendon coursing towards its insertion right greater than left.  Arch collapse noted with weightbearing.  Muscle strength 5/5 in dorsiflexion plantarflexion inversion and eversion No open wounds noted.  Nailplates thickened, elongated, dystrophic with yellow discoloration subungual debris.  Tenderness on palpation of the nail plates x 10. Decreased light touch sensation of the digits.  Subjective paresthesias noted.  Protective sensation decreased distally.    No images are attached to the encounter.    Results Labs HbA1c 8.5   Assessment:   1. DM type 2 with diabetic peripheral neuropathy (HCC)   2. Pain due to onychomycosis of toenails of both feet   3. Acquired pronation deformity of foot, left   4. Acquired pronation deformity of foot, right      Plan:  Patient was evaluated and treated and all questions answered.  Assessment and Plan Assessment & Plan Type 2 diabetes mellitus with diabetic polyneuropathy Chronic bilateral diabetic polyneuropathy, worse on the right, with constant burning, tingling, and decreased sensation. Multifactorial etiology, primarily due to diabetes. Hemoglobin A1c is 8.5, above target, increasing neuropathy risk. - Prescribed topical capsaicin  cream to feet once or twice daily, with gloves during application, for at least two weeks before assessing efficacy.  Prescription for 0.033% cream sent to patient's pharmacy though did advise that she may need to purchase this over the counter. - Educated on capsaicin  cream mechanism, expected course, initial burning sensation, and delayed benefit. - Discussed alternative therapies: spinal cord stimulator, high-concentration topical capsaicin   patches, available through pain specialists or neurologists, with variable insurance coverage. - Recommended neurology referral for further neuropathy evaluation and management. - Advised on glycemic control  importance, goal hemoglobin A1c <7, to slow neuropathy progression and improve foot health. - Provided diabetic foot care education: daily foot checks, avoidance of barefoot ambulation, prompt evaluation for new wounds, calluses, or infection signs. -I certify that this diagnosis represents a distinct and separate diagnosis that requires evaluation and treatment separate from other procedures or diagnosis    Acquired pronation deformity right foot greater than left foot Symptomatic with PTTD symptoms right lower extremity greater than left lower extremity. Recurrent pain and swelling along medial right ankle and arch, consistent with posterior tibial tendon dysfunction. Pes planus predisposes to overstrain and dysfunction. Symptoms impair ambulation. - Recommended supportive footwear with stiff soles and arch support; discussed Duwaine Sprang, Asics, New Balance. - Advised consideration of shoe inserts with arch support. - Provided exercises for posterior tibial tendon dysfunction. - Discussed NSAIDs (ibuprofen , naproxen) for symptom relief as needed. - Deferred steroid injection; to be considered if symptoms persist. - Planned follow-up in 3-4 weeks to reassess symptoms and response to interventions.  Plan to obtain imaging at this time - Discussed potential for custom orthotics depending on future evaluation.  #Onychomycosis with pain  -Nails palliatively debrided as below. -Educated on self-care  Procedure: Nail Debridement Rationale: Pain Type of Debridement: manual, sharp debridement. Instrumentation: Nail nipper, rotary burr. Number of Nails: 10       No follow-ups on file.    "

## 2024-07-10 ENCOUNTER — Encounter: Payer: Self-pay | Admitting: Neurology

## 2024-07-10 ENCOUNTER — Ambulatory Visit: Admitting: Neurology

## 2024-07-10 VITALS — BP 142/74 | HR 82 | Ht 66.0 in | Wt 317.0 lb

## 2024-07-10 DIAGNOSIS — G43711 Chronic migraine without aura, intractable, with status migrainosus: Secondary | ICD-10-CM

## 2024-07-10 DIAGNOSIS — G4733 Obstructive sleep apnea (adult) (pediatric): Secondary | ICD-10-CM | POA: Diagnosis not present

## 2024-07-10 NOTE — Progress Notes (Signed)
 [name]

## 2024-07-10 NOTE — Progress Notes (Signed)
 "  Patient: [name] Date of Birth: 08-14-1965  Reason for Visit: Follow up History from: Patient Primary Neurologist: [name] & [name] (sleep)  ASSESSMENT AND PLAN 59 y.o. year old female with history of HIV, non-Hodgkin's lymphoma, poorly controlled type 2 diabetes, depression, anxiety, chronic migraine headache.  1.  OSA on CPAP  -CPAP setup 04/17/24  -HST 12/26/2023 showing moderate to severe OSA with severe oxygen desaturations.  Total AHI 22.8/hour and O2 nadir of 74% with significant time below or at 88% saturation of over over 400 minutes for the study, indicating nocturnal hypoxemia  -Recommend nightly usage of CPAP minimum 4 hours.  Continue current settings.  Her mask is leaking.  Suspect she needs a new mask.  Suggest she contact her DME to order new supplies.  She has already noted significant benefit from CPAP.  She is motivated to continue.  AHI is significantly improved at 1.9 from 22.8 in HST.  We will follow-up in 6 months.   HISTORY OF PRESENT ILLNESS: Today 07/10/2024 Here for CPAP today.  Had HST 12/26/2023 showing moderate to severe OSA with severe oxygen desaturations.  Total AHI 22.8/hour and O2 nadir of 74% with significant time below or at 88% saturation of over over 400 minutes for the study, indicating nocturnal hypoxemia.  CPAP report shows 97% usage, greater than 4 hours 63%, 7-14 cmH2O, leak 40, AHI 1.9. CPAP setup 04/17/24. Sometimes at night she takes it off without realizing. Using FFM, lately trouble getting a new seal. Has insomnia. Takes Ambien . She feel a lot better with CPAP, no longer cat napping, falling asleep during the day, is more alert. Quite pleased with CPAP motivated to continue.   HISTORY  09/20/23 [name]: I saw your patient, [name], upon your kind request in my sleep clinic today for initial consultation of her sleep disorder, in particular, concern for underlying obstructive sleep apnea.  The patient is unaccompanied today.  As you know, Ms.  May is a 59 year old female with an underlying complex medical history of DVT, non-Hodgkin's lymphoma, status post chemotherapy and radiation therapy, COPD, history of cellulitis, asthma, anxiety, reflux disease and peptic ulcer disease, hiatal hernia, gastroparesis, HIV disease, hyperlipidemia, irritable bowel syndrome, migraine headaches, TMJ dysfunction, anemia, restless leg syndrome, history of shingles, and morbid obesity with a BMI of over 50, who reports snoring and excessive daytime somnolence.  Her Epworth sleepiness score is 3 out of 24, fatigue severity score is 56/63.  I reviewed your visit note from 08/10/2023.  She receives Botox  injections for her migraines.  She has never had a sleep study.  He reports chronic difficulty initiating and maintaining sleep for years.  She has been on Ambien  for about 3 to 4 years, she takes it nightly, 10 mg strength.  She is working on weight loss but has had quite a bit of weight fluctuation, she is also working on better diabetes control.  She reports that her last A1c was around 10.  It was indeed 10.1 about 2 weeks ago.  She does not have a set bedtime or rise time, may be in bed somewhere between 8 PM and 2 AM.  She has a rise time typically between 6 and 7.  She lives with her mom.  They sometimes sleep in the same room.  She is not aware of any family history of sleep apnea but it sounds like her mother has been encouraged to get tested for sleep apnea as well.  She has nocturia about 3  times per average night and reports frequent morning headaches.  She drinks caffeine in the form of tea and diet soda but some of her beverages are caffeine free.  She is a non-smoker, does not vape nicotine, she does not drink any alcohol.    REVIEW OF SYSTEMS: Out of a complete 14 system review of symptoms, the patient complains only of the following symptoms, and all other reviewed systems are negative.  See HPI  ALLERGIES: Allergies[1]  HOME MEDICATIONS: Outpatient  Medications Prior to Visit  Medication Sig Dispense Refill   albuterol  (PROAIR  HFA) 108 (90 Base) MCG/ACT inhaler USE 1 TO 2 PUFFS EVERY 4-6 HOURS AS NEEDED FOR SHORTNESS OF BREATH ORWHEEZING 8.5 g 1   Atogepant  (QULIPTA ) 60 MG TABS Take 1 tablet (60 mg total) by mouth daily. 90 tablet 3   Beclomethasone Dipropionate  80 MCG/ACT AERS Place 2 Inhalers into the nose daily. 8.7 g 10   bictegravir-emtricitabine -tenofovir  AF (BIKTARVY ) 50-200-25 MG TABS tablet Take 1 tablet by mouth daily. Please DC the symtuza  30 tablet 11   blood glucose meter kit and supplies Dispense based on patient and insurance preference. Use up to four times daily as directed. (FOR ICD-9 250.00, 250.01). 1 each 0   BOTOX  100 units SOLR injection INJECT UP TO 200 UNITS  INTRAMUSCULARLY EVERY 90 DAYS 2 each 3   Calcium  Carbonate-Vitamin D  (CALCIUM -VITAMIN D ) 500-200 MG-UNIT per tablet Take 2 tablets by mouth daily.      Capsaicin  0.033 % CREA Apply 1 Application topically 2 (two) times daily as needed. 56.6 g 1   Continuous Blood Gluc Receiver (DEXCOM G7 RECEIVER) DEVI 1 each by Does not apply route daily. 1 each 0   Continuous Blood Gluc Sensor (DEXCOM G7 SENSOR) MISC 1 each by Does not apply route every 14 (fourteen) days. 9 each 3   doxycycline  (VIBRA -TABS) 100 MG tablet Take 1 tablet (100 mg total) by mouth 2 (two) times daily. 20 tablet 2   FARXIGA  10 MG TABS tablet TAKE 1 TABLET BY MOUTH EVERY DAY 90 tablet 3   glucose blood test strip Use as instructed 100 each 12   GNP VITAMIN B-12 1000 MCG TBCR TAKE 1 TABLET BY MOUTH EVERY DAY 90 tablet 0   Insulin Aspart FlexPen (NOVOLOG) 100 UNIT/ML Inject into the skin.     Insulin Glargine (BASAGLAR KWIKPEN) 100 UNIT/ML SMARTSIG:30 Unit(s) SUB-Q Daily     lamoTRIgine  (LAMICTAL ) 100 MG tablet Take 1 tablet (100 mg total) by mouth 2 (two) times daily. 180 tablet 3   losartan  (COZAAR ) 100 MG tablet Take 1 tablet (100 mg total) by mouth daily. 90 tablet 3   mometasone -formoterol   (DULERA ) 100-5 MCG/ACT AERO USE 2 PUFFS BY MOUTH THE FIRST THING IN THE MORNING AND THEN ANOTHER 2 PUFFS ABOUT 12 HOURS LATER 13 g 5   MOUNJARO 2.5 MG/0.5ML Pen Inject 2.5 mg into the skin once a week.     nystatin  powder Apply 1 Application topically 3 (three) times daily. 15 g 1   pantoprazole  (PROTONIX ) 40 MG tablet TAKE 1 TABLET BY MOUTH 2 TIMES A DAY FORACID REFLUX 60 tablet 4   Plecanatide  (TRULANCE ) 3 MG TABS Take 1 tablet by mouth daily. 30 tablet 0   propranolol  (INDERAL ) 20 MG tablet Take 1 tablet (20 mg total) by mouth 2 (two) times daily. 180 tablet 3   rosuvastatin  (CRESTOR ) 20 MG tablet TAKE 1 TABLET BY MOUTH EVERY DAY FOR CHOLESTEROL 90 tablet 3   Tamsulosin HCl (FLOMAX) 0.4 MG  CAPS Take 0.4 mg by mouth daily. 1-2 daily     tiZANidine  (ZANAFLEX ) 4 MG tablet TAKE 1 TABLET BY MOUTH EVERY 8 HOURS AS NEEDED FOR MUSCLE SPASMS 90 tablet 11   zolmitriptan  (ZOMIG ) 5 MG tablet Take 1 tab at onset of migraine.  May repeat in 2 hrs, if needed.  Max dose: 2 tabs/day. This is a 30 day prescription. 12 tablet 11   zolpidem  (AMBIEN ) 10 MG tablet TAKE 1 TABLET BY MOUTH AT BEDTIME AS NEEDED FOR SLEEP (TRY NOT TO TAKE NIGHTLY) 30 tablet 3   diclofenac  Sodium (VOLTAREN ) 1 % GEL APPLY TOPICALLY TO AFFECTED AREA 4 GRAMS 4 TIMES DAILY (Patient not taking: Reported on 07/10/2024) 100 g 2   omega-3 acid ethyl esters (LOVAZA ) 1 g capsule Take 2 capsules (2 g total) by mouth 2 (two) times daily. (Patient not taking: Reported on 07/10/2024) 120 capsule 11   Facility-Administered Medications Prior to Visit  Medication Dose Route Frequency Provider Last Rate Last Admin   botulinum toxin Type A  (BOTOX ) injection 200 Units  200 Units Intramuscular Once [name] Duos, MD       botulinum toxin Type A  (BOTOX ) injection 200 Units  200 Units Intramuscular Once [name] Duos, MD        PAST MEDICAL HISTORY: Past Medical History:  Diagnosis Date   Acute gastritis without mention of hemorrhage 01/01/2002   Anemia 10/2009    macrocytic anemia with baseline MCV 104-106   Anxiety    Asthma    Boil 01/05/2017   Cellulitis 05/20/2020   Chondromalacia of right knee 04/20/2012   Complex tear of medial meniscus of left knee as current injury 02/25/2016   Complication of anesthesia    hard to wake up   Constipation 11/17/2009   COPD (chronic obstructive pulmonary disease) (HCC)    chronic bronchitis   Derangement of anterior horn of lateral meniscus of left knee 02/25/2016   Duodenitis without mention of hemorrhage 01/01/2002   DVT (deep venous thrombosis) (HCC)    history clot lt groin when she had lymphoma-in remision now.   Elevated triglycerides with high cholesterol 01/05/2017   Erythema 09/01/2021   Gastroparesis    GERD (gastroesophageal reflux disease)    Hiatal hernia 01/01/2002   HIV infection (HCC)    CD4 = 570 (05/2010), VL undetectation   Hyperlipidemia    Hyperlipidemia 06/24/2024   IBS (irritable bowel syndrome)    Migraines    on topamax  and triptans, frequent (almost daily) attacks    MRSA (methicillin resistant staph aureus) culture positive 12/20/2010   Non Hodgkin's lymphoma (HCC)    stage II, s/p resection, chemotherapy, radiotherapy, Dr. Freddie is her oncologist.    OSA (obstructive sleep apnea) 03/05/2024   PUD (peptic ulcer disease) 07/1997   per EGD report 07/1997 with history of esophagitis   Restless leg syndrome 12/24/2015   Shingles    in lumbar dermatome   Spondylolisthesis of lumbar region    L5-S1   TMJ (dislocation of temporomandibular joint)    Traumatic tear of lateral meniscus of right knee 04/20/2012   Umbilical bleeding 09/01/2021   Vaginal yeast infection 09/25/2017    PAST SURGICAL HISTORY: Past Surgical History:  Procedure Laterality Date   CHOLECYSTECTOMY     ENDOMETRIAL ABLATION     KNEE ARTHROSCOPY Right 04/20/12   KNEE ARTHROSCOPY WITH LATERAL MENISECTOMY Left 02/25/2016   Procedure: LEFT KNEE ARTHROSCOPY WITH MEDIAL AND  LATERAL MENISECTOMIES;   Surgeon: Fonda Olmsted, MD;  Location: [facility];  Service: Orthopedics;  Laterality: Left;   KNEE ARTHROSCOPY WITH MEDIAL MENISECTOMY Left 02/25/2016   Procedure: KNEE ARTHROSCOPY WITH MEDIAL MENISECTOMY;  Surgeon: Fonda Olmsted, MD;  Location: [facility];  Service: Orthopedics;  Laterality: Left;   TONSILLECTOMY     TYMPANOPLASTY Left    TYMPANOSTOMY TUBE PLACEMENT     as child-both   UPPER GASTROINTESTINAL ENDOSCOPY  2003, 2007   done by Dr. Cloretta    FAMILY HISTORY: Family History  Problem Relation Age of Onset   Hypertension Mother    Stroke Mother    Heart disease Mother    Diabetes Mother    Colon polyps Mother    Breast cancer Paternal Grandmother    Kidney cancer Maternal Uncle    Heart disease Maternal Uncle    Diabetes Maternal Uncle    Diabetes Maternal Grandmother    Heart disease Maternal Grandmother    Emphysema Paternal Uncle        never smoker   Emphysema Paternal Grandfather        never smoker   Colon cancer Paternal Aunt        with met to lung    Non-Hodgkin's lymphoma Maternal Uncle    Heart disease Maternal Grandfather     SOCIAL HISTORY: Social History   Socioeconomic History   Marital status: Divorced    Spouse name: Not on file   Number of children: 0   Years of education: 12   Highest education level: Not on file  Occupational History   Occupation: disabled    Associate Professor: UNEMPLOYED  Tobacco Use   Smoking status: Never   Smokeless tobacco: Never  Vaping Use   Vaping status: Never Used  Substance and Sexual Activity   Alcohol use: No    Alcohol/week: 0.0 standard drinks of alcohol   Drug use: No   Sexual activity: Not Currently    Partners: Male    Birth control/protection: Surgical  Other Topics Concern   Not on file  Social History Narrative      Patient is disabled.   Right handed.   Caffeine - Diet soda., tea   Education - high school   Social Drivers of Health   Tobacco Use: Low Risk  (07/10/2024)   Patient History    Smoking Tobacco Use: Never    Smokeless Tobacco Use: Never    Passive Exposure: Not on file  Financial Resource Strain: Not on file  Food Insecurity: Low Risk (09/05/2023)   Received from [facility]   Epic    Within the past 12 months, you worried that your food would run out before you got money to buy more: Never true    Within the past 12 months, the food you bought just didn't last and you didn't have money to get more. : Never true  Transportation Needs: No Transportation Needs (09/05/2023)   Received from [facility]    In the past 12 months, has lack of reliable transportation kept you from medical appointments, meetings, work or from getting things needed for daily living? : No  Physical Activity: Not on file  Stress: Not on file  Social Connections: Not on file  Intimate Partner Violence: Not on file  Depression (PHQ2-9): Medium Risk (10/25/2023)   Depression (PHQ2-9)    PHQ-2 Score: 10  Alcohol Screen: Not on file  Housing: Low Risk (09/05/2023)   Received from [facility]   Epic    What is your living situation  today?: I have a steady place to live    Think about the place you live. Do you have problems with any of the following? Choose all that apply:: None/None on this list  Utilities: Low Risk (09/05/2023)   Received from [facility]   Utilities    In the past 12 months has the electric, gas, oil, or water company threatened to shut off services in your home? : No  Health Literacy: Not on file    PHYSICAL EXAM  Vitals:   07/10/24 1329  BP: (!) 142/74  Pulse: 82  SpO2: 95%  Weight: (!) 317 lb (143.8 kg)  Height: 5' 6 (1.676 m)   Body mass index is 51.17 kg/m.  Generalized: Well developed, in no acute distress  Neurological examination  Mentation: Alert oriented to time, place, history taking. Follows all commands speech and language fluent Cranial nerve II-XII: Pupils were equal round reactive to light.   Motor: Moves all extremities independent Gait and station: Gait is normal.   DIAGNOSTIC DATA (LABS, IMAGING, TESTING) - I reviewed patient records, labs, notes, testing and imaging myself where available.  Lab Results  Component Value Date   WBC 6.8 06/25/2024   HGB 16.8 (H) 06/25/2024   HCT 52.0 (H) 06/25/2024   MCV 85.2 06/25/2024   PLT 279 06/25/2024      Component Value Date/Time   NA 132 (L) 06/25/2024 0921   NA 137 04/15/2024 1627   NA 142 07/16/2013 1147   K 4.4 06/25/2024 0921   K 3.8 07/16/2013 1147   CL 96 (L) 06/25/2024 0921   CL 100 06/21/2012 1026   CO2 27 06/25/2024 0921   CO2 28 07/16/2013 1147   GLUCOSE 183 (H) 06/25/2024 0921   GLUCOSE 102 07/16/2013 1147   GLUCOSE 90 06/21/2012 1026   BUN 13 06/25/2024 0921   BUN 7 04/15/2024 1627   BUN 12.2 07/16/2013 1147   CREATININE 0.89 06/25/2024 0921   CREATININE 0.8 07/16/2013 1147   CALCIUM  9.6 06/25/2024 0921   CALCIUM  9.4 07/16/2013 1147   PROT 8.0 06/25/2024 0921   PROT 6.9 02/23/2023 1543   PROT 7.7 07/16/2013 1147   ALBUMIN 4.3 02/23/2023 1543   ALBUMIN 4.0 07/16/2013 1147   AST 12 06/25/2024 0921   AST 16 07/16/2013 1147   ALT 8 06/25/2024 0921   ALT 11 07/16/2013 1147   ALKPHOS 92 02/23/2023 1543   ALKPHOS 78 07/16/2013 1147   BILITOT 0.4 06/25/2024 0921   BILITOT <0.2 02/23/2023 1543   BILITOT 0.27 07/16/2013 1147   GFRNONAA 85 05/07/2020 1206   GFRAA 99 05/07/2020 1206   Lab Results  Component Value Date   CHOL 153 06/25/2024   HDL 33 (L) 06/25/2024   LDLCALC  06/25/2024     Comment:     . LDL cholesterol not calculated. Triglyceride levels greater than 400 mg/dL invalidate calculated LDL results. . Reference range: <100 . Desirable range <100 mg/dL for primary prevention;   <70 mg/dL for patients with CHD or diabetic patients  with > or = 2 CHD risk factors. SABRA LDL-C is now calculated using the Martin-Hopkins  calculation, which is a validated novel method providing  better  accuracy than the Friedewald equation in the  estimation of LDL-C.  Gladis APPLETHWAITE et al. SANDREA. 7986;689(80): 2061-2068  (http://education.[facility].com/faq/FAQ164)    LDLDIRECT 131 (H) 11/05/2013   TRIG 559 (H) 06/25/2024   CHOLHDL 4.6 06/25/2024   Lab Results  Component Value Date   HGBA1C 9.0 (A) 04/15/2024  Lab Results  Component Value Date   VITAMINB12 271 03/01/2021   Lab Results  Component Value Date   TSH 2.630 08/31/2023    Lauraine Born, AGNP-C, DNP 07/10/2024, 2:04 PM [facility] [address] [place], [place] [address] [phone]      [1]  Allergies Allergen Reactions   Divalproex Sodium Nausea And Vomiting    Causes light-headedness   Gabapentin Nausea And Vomiting    confusion   Ondansetron  Hcl Nausea And Vomiting    Triggers migraines, makes me vomit   Pineapple    Propoxyphene N-Acetaminophen  Nausea And Vomiting   Zocor  [Simvastatin  - High Dose]    Penicillins     REACTION: lips swollen to point of bleeding, sever vaginal irritation  Reports tolerating augmentin  well without any complaints or reaction   "

## 2024-07-10 NOTE — Patient Instructions (Signed)
 Great to see you today! Recommend CPAP usage nightly minimum 4 hours Contact DME to order new supplies Follow-up in 6 months for CPAP.  Thanks!!

## 2024-07-16 ENCOUNTER — Telehealth: Payer: Self-pay

## 2024-07-16 DIAGNOSIS — E1110 Type 2 diabetes mellitus with ketoacidosis without coma: Secondary | ICD-10-CM

## 2024-07-16 NOTE — Progress Notes (Signed)
 Complex Care Management Note  Care Guide Note 07/16/2024 Name: [name] MRN: [id number] DOB: Apr 21, 1966  [name] is a 59 y.o. year old female who sees Elicia Sharper, DO for primary care. I reached out to Grayce JONELLE Haskell by phone today to offer complex care management services.  [name] was given information about Complex Care Management services today including:   The Complex Care Management services include support from the care team which includes your Nurse Care Manager, Clinical Social Worker, or Pharmacist.  The Complex Care Management team is here to help remove barriers to the health concerns and goals most important to you. Complex Care Management services are voluntary, and the patient may decline or stop services at any time by request to their care team member.   Complex Care Management Consent Status: Patient did not agree to participate in complex care management services at this time.  Follow up plan:  Patient will follow up with PCP.  Encounter Outcome:  Patient Refused  Dreama Agent [facility], [facility] VBCI Assistant Direct Dial: [phone]  Fax: [phone]

## 2024-07-22 ENCOUNTER — Other Ambulatory Visit: Payer: Self-pay | Admitting: Student

## 2024-07-31 ENCOUNTER — Other Ambulatory Visit: Payer: Self-pay | Admitting: Student

## 2024-08-01 NOTE — Telephone Encounter (Signed)
 I added R41.0 disorientation to last OV and faxed updated notes to  [facility]. Received a receipt of confirmation.

## 2024-08-01 NOTE — Telephone Encounter (Signed)
 May also add on R41.0 as diangosis

## 2024-08-01 NOTE — Telephone Encounter (Signed)
 Per Heron w/ [facility],   Need this:   Additional MR's and rationale needed. Tremors last anywhere from hours to days, no LOC or post-ictal confusion associated w/these symptoms. R25.1 is not a covered/acceptable Dx code - please reference the [facility] [place] [facility] medical policy.

## 2024-08-07 ENCOUNTER — Telehealth: Payer: Self-pay | Admitting: Infectious Disease

## 2024-08-07 NOTE — Telephone Encounter (Signed)
 [name] to confirm upcoming appt. Pt stated she needs to go back to [place]  due to severe abdominal pain, upset stomach, and a non-stop appetite. Pt is currently on the reschedule list for Dr. Fleeta Rothman, but before I call for rescheduling, please advise if pt can be scheduled with pharmacy or needs to see a provider. Fleeta Rothman is currently booked until March. Pt can be reached at [phone].

## 2024-08-07 NOTE — Telephone Encounter (Signed)
 I saw she has an appointment made with Dr. Fleeta Rothman on 2/11 - was that one needing to be rescheduled at all? Definitely happy to see her if needed.

## 2024-08-08 ENCOUNTER — Other Ambulatory Visit: Payer: Self-pay

## 2024-08-08 ENCOUNTER — Ambulatory Visit: Payer: Self-pay | Admitting: Infectious Disease

## 2024-08-08 MED ORDER — SYMTUZA 800-150-200-10 MG PO TABS: 1.0000 | ORAL_TABLET | Freq: Every day | ORAL | 5 refills | Status: AC

## 2024-08-08 NOTE — Telephone Encounter (Signed)
 [name] called back requesting to cancel upcoming appt with pharmacy. Pt stated she does not see the need to speak with them when she is not going to take the medicine. She declined scheduling with Dr. Fleeta Rothman and requested a call from a nurse. Abelina can be reached at [phone].

## 2024-08-08 NOTE — Telephone Encounter (Signed)
 I contacted [name] to offer a 2/9 opening with Dr. Fleeta Rothman. Pt declined and stated she was unable to make it in due to weather conditions in her area. I also offered later into next week with pharmacy but pt suggested the end of the month. She stated she stopped taking the other medication and started back on Symtuza . Pt is scheduled 2/23.

## 2024-08-13 ENCOUNTER — Ambulatory Visit: Admitting: Podiatry

## 2024-08-14 ENCOUNTER — Ambulatory Visit: Admitting: Infectious Disease

## 2024-08-26 ENCOUNTER — Ambulatory Visit: Payer: Self-pay | Admitting: Student

## 2024-08-26 ENCOUNTER — Ambulatory Visit: Admitting: Pharmacist

## 2024-09-03 ENCOUNTER — Ambulatory Visit: Admitting: Podiatry

## 2024-09-09 ENCOUNTER — Ambulatory Visit: Payer: Self-pay | Admitting: Student

## 2024-09-11 ENCOUNTER — Ambulatory Visit: Admitting: Neurology

## 2025-02-18 ENCOUNTER — Ambulatory Visit: Admitting: Neurology
